# Patient Record
Sex: Male | Born: 1950 | Race: White | Hispanic: No | Smoking: Current every day smoker
Health system: Southern US, Community
[De-identification: ages and names within clinical notes are randomized; demographics above are authoritative.]

## PROBLEM LIST (undated history)

## (undated) DIAGNOSIS — M1990 Unspecified osteoarthritis, unspecified site: Secondary | ICD-10-CM

## (undated) DIAGNOSIS — E041 Nontoxic single thyroid nodule: Secondary | ICD-10-CM

## (undated) DIAGNOSIS — I2510 Atherosclerotic heart disease of native coronary artery without angina pectoris: Secondary | ICD-10-CM

## (undated) DIAGNOSIS — T7840XA Allergy, unspecified, initial encounter: Secondary | ICD-10-CM

## (undated) DIAGNOSIS — K219 Gastro-esophageal reflux disease without esophagitis: Secondary | ICD-10-CM

## (undated) DIAGNOSIS — E785 Hyperlipidemia, unspecified: Secondary | ICD-10-CM

## (undated) DIAGNOSIS — F329 Major depressive disorder, single episode, unspecified: Secondary | ICD-10-CM

## (undated) DIAGNOSIS — J302 Other seasonal allergic rhinitis: Secondary | ICD-10-CM

## (undated) DIAGNOSIS — R918 Other nonspecific abnormal finding of lung field: Secondary | ICD-10-CM

## (undated) DIAGNOSIS — N401 Enlarged prostate with lower urinary tract symptoms: Secondary | ICD-10-CM

## (undated) DIAGNOSIS — F418 Other specified anxiety disorders: Secondary | ICD-10-CM

## (undated) DIAGNOSIS — J449 Chronic obstructive pulmonary disease, unspecified: Secondary | ICD-10-CM

## (undated) DIAGNOSIS — R252 Cramp and spasm: Secondary | ICD-10-CM

## (undated) DIAGNOSIS — E538 Deficiency of other specified B group vitamins: Secondary | ICD-10-CM

## (undated) DIAGNOSIS — Z87442 Personal history of urinary calculi: Secondary | ICD-10-CM

## (undated) DIAGNOSIS — J189 Pneumonia, unspecified organism: Secondary | ICD-10-CM

## (undated) DIAGNOSIS — K8020 Calculus of gallbladder without cholecystitis without obstruction: Secondary | ICD-10-CM

## (undated) DIAGNOSIS — I219 Acute myocardial infarction, unspecified: Secondary | ICD-10-CM

## (undated) DIAGNOSIS — I639 Cerebral infarction, unspecified: Secondary | ICD-10-CM

## (undated) DIAGNOSIS — I499 Cardiac arrhythmia, unspecified: Secondary | ICD-10-CM

## (undated) DIAGNOSIS — E0590 Thyrotoxicosis, unspecified without thyrotoxic crisis or storm: Secondary | ICD-10-CM

## (undated) DIAGNOSIS — F32A Depression, unspecified: Secondary | ICD-10-CM

## (undated) DIAGNOSIS — N189 Chronic kidney disease, unspecified: Secondary | ICD-10-CM

## (undated) DIAGNOSIS — I700 Atherosclerosis of aorta: Secondary | ICD-10-CM

## (undated) DIAGNOSIS — F419 Anxiety disorder, unspecified: Secondary | ICD-10-CM

## (undated) DIAGNOSIS — R42 Dizziness and giddiness: Secondary | ICD-10-CM

## (undated) HISTORY — DX: Hyperlipidemia, unspecified: E78.5

## (undated) HISTORY — DX: Pneumonia, unspecified organism: J18.9

## (undated) HISTORY — DX: Gastro-esophageal reflux disease without esophagitis: K21.9

## (undated) HISTORY — DX: Other seasonal allergic rhinitis: J30.2

## (undated) HISTORY — DX: Calculus of gallbladder without cholecystitis without obstruction: K80.20

## (undated) HISTORY — PX: COLONOSCOPY: SHX174

## (undated) HISTORY — DX: Allergy, unspecified, initial encounter: T78.40XA

## (undated) HISTORY — DX: Chronic kidney disease, unspecified: N18.9

---
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
## 2014-01-09 DIAGNOSIS — I499 Cardiac arrhythmia, unspecified: Secondary | ICD-10-CM

## 2014-01-09 HISTORY — DX: Cardiac arrhythmia, unspecified: I49.9

## 2015-01-19 DIAGNOSIS — R238 Other skin changes: Secondary | ICD-10-CM | POA: Diagnosis not present

## 2015-01-19 DIAGNOSIS — I4510 Unspecified right bundle-branch block: Secondary | ICD-10-CM | POA: Diagnosis not present

## 2015-01-19 DIAGNOSIS — J449 Chronic obstructive pulmonary disease, unspecified: Secondary | ICD-10-CM | POA: Diagnosis not present

## 2015-01-19 DIAGNOSIS — R55 Syncope and collapse: Secondary | ICD-10-CM | POA: Diagnosis not present

## 2015-01-19 DIAGNOSIS — D72829 Elevated white blood cell count, unspecified: Secondary | ICD-10-CM | POA: Diagnosis not present

## 2015-01-19 DIAGNOSIS — R9431 Abnormal electrocardiogram [ECG] [EKG]: Secondary | ICD-10-CM | POA: Diagnosis not present

## 2015-01-19 DIAGNOSIS — R0600 Dyspnea, unspecified: Secondary | ICD-10-CM | POA: Diagnosis not present

## 2015-01-19 DIAGNOSIS — S40811A Abrasion of right upper arm, initial encounter: Secondary | ICD-10-CM | POA: Diagnosis not present

## 2015-01-19 DIAGNOSIS — I4891 Unspecified atrial fibrillation: Secondary | ICD-10-CM | POA: Diagnosis not present

## 2015-01-19 DIAGNOSIS — Z79899 Other long term (current) drug therapy: Secondary | ICD-10-CM | POA: Diagnosis not present

## 2015-01-19 DIAGNOSIS — N400 Enlarged prostate without lower urinary tract symptoms: Secondary | ICD-10-CM | POA: Diagnosis not present

## 2015-01-19 DIAGNOSIS — S40812A Abrasion of left upper arm, initial encounter: Secondary | ICD-10-CM | POA: Diagnosis not present

## 2015-01-19 DIAGNOSIS — E0580 Other thyrotoxicosis without thyrotoxic crisis or storm: Secondary | ICD-10-CM | POA: Diagnosis not present

## 2015-01-19 DIAGNOSIS — Z720 Tobacco use: Secondary | ICD-10-CM | POA: Diagnosis not present

## 2015-01-19 DIAGNOSIS — R069 Unspecified abnormalities of breathing: Secondary | ICD-10-CM | POA: Diagnosis not present

## 2015-01-19 DIAGNOSIS — Z801 Family history of malignant neoplasm of trachea, bronchus and lung: Secondary | ICD-10-CM | POA: Diagnosis not present

## 2015-01-19 DIAGNOSIS — I444 Left anterior fascicular block: Secondary | ICD-10-CM | POA: Diagnosis not present

## 2015-01-19 DIAGNOSIS — J9809 Other diseases of bronchus, not elsewhere classified: Secondary | ICD-10-CM | POA: Diagnosis not present

## 2015-01-19 DIAGNOSIS — E0590 Thyrotoxicosis, unspecified without thyrotoxic crisis or storm: Secondary | ICD-10-CM | POA: Diagnosis not present

## 2015-01-19 DIAGNOSIS — I083 Combined rheumatic disorders of mitral, aortic and tricuspid valves: Secondary | ICD-10-CM | POA: Diagnosis not present

## 2015-01-21 DIAGNOSIS — R931 Abnormal findings on diagnostic imaging of heart and coronary circulation: Secondary | ICD-10-CM | POA: Diagnosis not present

## 2015-01-21 DIAGNOSIS — I4891 Unspecified atrial fibrillation: Secondary | ICD-10-CM | POA: Diagnosis not present

## 2015-01-21 DIAGNOSIS — I059 Rheumatic mitral valve disease, unspecified: Secondary | ICD-10-CM | POA: Diagnosis not present

## 2015-01-21 DIAGNOSIS — F17200 Nicotine dependence, unspecified, uncomplicated: Secondary | ICD-10-CM | POA: Diagnosis not present

## 2015-01-22 DIAGNOSIS — E02 Subclinical iodine-deficiency hypothyroidism: Secondary | ICD-10-CM | POA: Diagnosis not present

## 2015-01-22 DIAGNOSIS — Z8679 Personal history of other diseases of the circulatory system: Secondary | ICD-10-CM | POA: Diagnosis not present

## 2015-01-22 DIAGNOSIS — R51 Headache: Secondary | ICD-10-CM | POA: Diagnosis not present

## 2015-01-22 DIAGNOSIS — E039 Hypothyroidism, unspecified: Secondary | ICD-10-CM | POA: Diagnosis not present

## 2015-01-22 DIAGNOSIS — R531 Weakness: Secondary | ICD-10-CM | POA: Diagnosis not present

## 2015-01-22 DIAGNOSIS — I4891 Unspecified atrial fibrillation: Secondary | ICD-10-CM | POA: Diagnosis not present

## 2015-01-22 DIAGNOSIS — R002 Palpitations: Secondary | ICD-10-CM | POA: Diagnosis not present

## 2015-01-22 DIAGNOSIS — F17210 Nicotine dependence, cigarettes, uncomplicated: Secondary | ICD-10-CM | POA: Diagnosis not present

## 2015-01-22 DIAGNOSIS — Z7982 Long term (current) use of aspirin: Secondary | ICD-10-CM | POA: Diagnosis not present

## 2015-01-22 DIAGNOSIS — R079 Chest pain, unspecified: Secondary | ICD-10-CM | POA: Diagnosis not present

## 2015-01-22 DIAGNOSIS — R42 Dizziness and giddiness: Secondary | ICD-10-CM | POA: Diagnosis not present

## 2015-01-22 DIAGNOSIS — N400 Enlarged prostate without lower urinary tract symptoms: Secondary | ICD-10-CM | POA: Diagnosis not present

## 2015-01-22 DIAGNOSIS — I480 Paroxysmal atrial fibrillation: Secondary | ICD-10-CM | POA: Diagnosis not present

## 2015-01-22 DIAGNOSIS — R55 Syncope and collapse: Secondary | ICD-10-CM | POA: Diagnosis not present

## 2015-01-22 DIAGNOSIS — Z79899 Other long term (current) drug therapy: Secondary | ICD-10-CM | POA: Diagnosis not present

## 2015-01-23 DIAGNOSIS — E039 Hypothyroidism, unspecified: Secondary | ICD-10-CM | POA: Diagnosis not present

## 2015-01-23 DIAGNOSIS — R42 Dizziness and giddiness: Secondary | ICD-10-CM | POA: Diagnosis not present

## 2015-01-23 DIAGNOSIS — R002 Palpitations: Secondary | ICD-10-CM | POA: Diagnosis not present

## 2015-01-23 DIAGNOSIS — G939 Disorder of brain, unspecified: Secondary | ICD-10-CM | POA: Diagnosis not present

## 2015-01-23 DIAGNOSIS — I4891 Unspecified atrial fibrillation: Secondary | ICD-10-CM | POA: Diagnosis not present

## 2015-01-26 DIAGNOSIS — F17200 Nicotine dependence, unspecified, uncomplicated: Secondary | ICD-10-CM | POA: Diagnosis not present

## 2015-01-26 DIAGNOSIS — F419 Anxiety disorder, unspecified: Secondary | ICD-10-CM | POA: Diagnosis not present

## 2015-01-26 DIAGNOSIS — R21 Rash and other nonspecific skin eruption: Secondary | ICD-10-CM | POA: Diagnosis not present

## 2015-01-26 DIAGNOSIS — E039 Hypothyroidism, unspecified: Secondary | ICD-10-CM | POA: Diagnosis not present

## 2015-01-26 DIAGNOSIS — L282 Other prurigo: Secondary | ICD-10-CM | POA: Diagnosis not present

## 2015-01-26 DIAGNOSIS — F322 Major depressive disorder, single episode, severe without psychotic features: Secondary | ICD-10-CM | POA: Diagnosis not present

## 2015-01-26 DIAGNOSIS — N400 Enlarged prostate without lower urinary tract symptoms: Secondary | ICD-10-CM | POA: Diagnosis not present

## 2015-01-26 DIAGNOSIS — I480 Paroxysmal atrial fibrillation: Secondary | ICD-10-CM | POA: Diagnosis not present

## 2015-02-05 DIAGNOSIS — R1110 Vomiting, unspecified: Secondary | ICD-10-CM | POA: Diagnosis not present

## 2015-02-11 DIAGNOSIS — I4891 Unspecified atrial fibrillation: Secondary | ICD-10-CM | POA: Diagnosis not present

## 2015-02-11 DIAGNOSIS — Z6828 Body mass index (BMI) 28.0-28.9, adult: Secondary | ICD-10-CM | POA: Diagnosis not present

## 2015-02-11 DIAGNOSIS — Z79899 Other long term (current) drug therapy: Secondary | ICD-10-CM | POA: Diagnosis not present

## 2015-02-28 DIAGNOSIS — L299 Pruritus, unspecified: Secondary | ICD-10-CM | POA: Diagnosis not present

## 2015-02-28 DIAGNOSIS — R21 Rash and other nonspecific skin eruption: Secondary | ICD-10-CM | POA: Diagnosis not present

## 2015-03-04 DIAGNOSIS — E0590 Thyrotoxicosis, unspecified without thyrotoxic crisis or storm: Secondary | ICD-10-CM | POA: Diagnosis not present

## 2015-03-04 DIAGNOSIS — E039 Hypothyroidism, unspecified: Secondary | ICD-10-CM | POA: Diagnosis not present

## 2015-03-17 DIAGNOSIS — R21 Rash and other nonspecific skin eruption: Secondary | ICD-10-CM | POA: Diagnosis not present

## 2015-03-17 DIAGNOSIS — M542 Cervicalgia: Secondary | ICD-10-CM | POA: Diagnosis not present

## 2015-03-17 DIAGNOSIS — R42 Dizziness and giddiness: Secondary | ICD-10-CM | POA: Diagnosis not present

## 2015-03-17 DIAGNOSIS — I959 Hypotension, unspecified: Secondary | ICD-10-CM | POA: Diagnosis not present

## 2015-03-18 DIAGNOSIS — R42 Dizziness and giddiness: Secondary | ICD-10-CM | POA: Diagnosis not present

## 2015-03-18 DIAGNOSIS — I4891 Unspecified atrial fibrillation: Secondary | ICD-10-CM | POA: Diagnosis not present

## 2015-03-18 DIAGNOSIS — I480 Paroxysmal atrial fibrillation: Secondary | ICD-10-CM | POA: Diagnosis not present

## 2015-03-18 DIAGNOSIS — Z6828 Body mass index (BMI) 28.0-28.9, adult: Secondary | ICD-10-CM | POA: Diagnosis not present

## 2015-03-29 DIAGNOSIS — M5412 Radiculopathy, cervical region: Secondary | ICD-10-CM | POA: Diagnosis not present

## 2015-03-29 DIAGNOSIS — R42 Dizziness and giddiness: Secondary | ICD-10-CM | POA: Diagnosis not present

## 2015-04-08 DIAGNOSIS — M542 Cervicalgia: Secondary | ICD-10-CM | POA: Diagnosis not present

## 2015-04-08 DIAGNOSIS — M5412 Radiculopathy, cervical region: Secondary | ICD-10-CM | POA: Diagnosis not present

## 2015-04-09 DIAGNOSIS — M25511 Pain in right shoulder: Secondary | ICD-10-CM | POA: Diagnosis not present

## 2015-04-12 DIAGNOSIS — R202 Paresthesia of skin: Secondary | ICD-10-CM | POA: Diagnosis not present

## 2015-05-04 DIAGNOSIS — Z87891 Personal history of nicotine dependence: Secondary | ICD-10-CM | POA: Diagnosis not present

## 2015-05-04 DIAGNOSIS — E0520 Thyrotoxicosis with toxic multinodular goiter without thyrotoxic crisis or storm: Secondary | ICD-10-CM | POA: Diagnosis not present

## 2015-05-04 DIAGNOSIS — R490 Dysphonia: Secondary | ICD-10-CM | POA: Diagnosis not present

## 2015-05-04 DIAGNOSIS — I4891 Unspecified atrial fibrillation: Secondary | ICD-10-CM | POA: Diagnosis not present

## 2015-05-04 DIAGNOSIS — E0590 Thyrotoxicosis, unspecified without thyrotoxic crisis or storm: Secondary | ICD-10-CM | POA: Diagnosis not present

## 2015-05-19 DIAGNOSIS — R490 Dysphonia: Secondary | ICD-10-CM | POA: Diagnosis not present

## 2015-05-19 DIAGNOSIS — B370 Candidal stomatitis: Secondary | ICD-10-CM | POA: Diagnosis not present

## 2015-05-19 DIAGNOSIS — J342 Deviated nasal septum: Secondary | ICD-10-CM | POA: Diagnosis not present

## 2015-05-27 DIAGNOSIS — M25511 Pain in right shoulder: Secondary | ICD-10-CM | POA: Diagnosis not present

## 2015-06-10 DIAGNOSIS — F17200 Nicotine dependence, unspecified, uncomplicated: Secondary | ICD-10-CM | POA: Diagnosis not present

## 2015-06-10 DIAGNOSIS — R490 Dysphonia: Secondary | ICD-10-CM | POA: Diagnosis not present

## 2015-06-10 DIAGNOSIS — B370 Candidal stomatitis: Secondary | ICD-10-CM | POA: Diagnosis not present

## 2015-06-29 DIAGNOSIS — Z0000 Encounter for general adult medical examination without abnormal findings: Secondary | ICD-10-CM | POA: Diagnosis not present

## 2015-06-29 DIAGNOSIS — R0789 Other chest pain: Secondary | ICD-10-CM | POA: Diagnosis not present

## 2015-06-29 DIAGNOSIS — N400 Enlarged prostate without lower urinary tract symptoms: Secondary | ICD-10-CM | POA: Diagnosis not present

## 2015-06-29 DIAGNOSIS — Z23 Encounter for immunization: Secondary | ICD-10-CM | POA: Diagnosis not present

## 2015-06-29 DIAGNOSIS — E039 Hypothyroidism, unspecified: Secondary | ICD-10-CM | POA: Diagnosis not present

## 2015-06-29 DIAGNOSIS — R079 Chest pain, unspecified: Secondary | ICD-10-CM | POA: Diagnosis not present

## 2015-06-29 DIAGNOSIS — E0590 Thyrotoxicosis, unspecified without thyrotoxic crisis or storm: Secondary | ICD-10-CM | POA: Diagnosis not present

## 2015-06-29 DIAGNOSIS — Z1159 Encounter for screening for other viral diseases: Secondary | ICD-10-CM | POA: Diagnosis not present

## 2015-07-27 DIAGNOSIS — I4891 Unspecified atrial fibrillation: Secondary | ICD-10-CM | POA: Diagnosis not present

## 2015-07-27 DIAGNOSIS — F329 Major depressive disorder, single episode, unspecified: Secondary | ICD-10-CM | POA: Diagnosis not present

## 2015-08-10 DIAGNOSIS — F17200 Nicotine dependence, unspecified, uncomplicated: Secondary | ICD-10-CM | POA: Diagnosis not present

## 2015-08-10 DIAGNOSIS — M25511 Pain in right shoulder: Secondary | ICD-10-CM | POA: Diagnosis not present

## 2015-08-10 DIAGNOSIS — G4700 Insomnia, unspecified: Secondary | ICD-10-CM | POA: Diagnosis not present

## 2015-08-25 DIAGNOSIS — F419 Anxiety disorder, unspecified: Secondary | ICD-10-CM | POA: Diagnosis not present

## 2015-08-25 DIAGNOSIS — I4891 Unspecified atrial fibrillation: Secondary | ICD-10-CM | POA: Diagnosis not present

## 2015-08-25 DIAGNOSIS — F329 Major depressive disorder, single episode, unspecified: Secondary | ICD-10-CM | POA: Diagnosis not present

## 2015-08-30 DIAGNOSIS — M25511 Pain in right shoulder: Secondary | ICD-10-CM | POA: Diagnosis not present

## 2015-09-30 DIAGNOSIS — F329 Major depressive disorder, single episode, unspecified: Secondary | ICD-10-CM | POA: Diagnosis not present

## 2015-09-30 DIAGNOSIS — I4891 Unspecified atrial fibrillation: Secondary | ICD-10-CM | POA: Diagnosis not present

## 2015-09-30 DIAGNOSIS — F419 Anxiety disorder, unspecified: Secondary | ICD-10-CM | POA: Diagnosis not present

## 2015-10-11 DIAGNOSIS — M25511 Pain in right shoulder: Secondary | ICD-10-CM | POA: Diagnosis not present

## 2015-10-12 DIAGNOSIS — F419 Anxiety disorder, unspecified: Secondary | ICD-10-CM | POA: Diagnosis not present

## 2015-10-12 DIAGNOSIS — I4891 Unspecified atrial fibrillation: Secondary | ICD-10-CM | POA: Diagnosis not present

## 2015-10-12 DIAGNOSIS — F329 Major depressive disorder, single episode, unspecified: Secondary | ICD-10-CM | POA: Diagnosis not present

## 2015-10-20 DIAGNOSIS — M25611 Stiffness of right shoulder, not elsewhere classified: Secondary | ICD-10-CM | POA: Diagnosis not present

## 2015-10-20 DIAGNOSIS — M25511 Pain in right shoulder: Secondary | ICD-10-CM | POA: Diagnosis not present

## 2015-10-26 DIAGNOSIS — M25511 Pain in right shoulder: Secondary | ICD-10-CM | POA: Diagnosis not present

## 2015-10-26 DIAGNOSIS — M25611 Stiffness of right shoulder, not elsewhere classified: Secondary | ICD-10-CM | POA: Diagnosis not present

## 2015-10-28 DIAGNOSIS — M25611 Stiffness of right shoulder, not elsewhere classified: Secondary | ICD-10-CM | POA: Diagnosis not present

## 2015-10-28 DIAGNOSIS — M25511 Pain in right shoulder: Secondary | ICD-10-CM | POA: Diagnosis not present

## 2015-11-01 DIAGNOSIS — L259 Unspecified contact dermatitis, unspecified cause: Secondary | ICD-10-CM | POA: Diagnosis not present

## 2015-11-01 DIAGNOSIS — M25511 Pain in right shoulder: Secondary | ICD-10-CM | POA: Diagnosis not present

## 2015-11-02 DIAGNOSIS — M25511 Pain in right shoulder: Secondary | ICD-10-CM | POA: Diagnosis not present

## 2015-11-02 DIAGNOSIS — M25611 Stiffness of right shoulder, not elsewhere classified: Secondary | ICD-10-CM | POA: Diagnosis not present

## 2015-11-10 DIAGNOSIS — M25511 Pain in right shoulder: Secondary | ICD-10-CM | POA: Diagnosis not present

## 2015-11-29 ENCOUNTER — Emergency Department (HOSPITAL_COMMUNITY)
Admission: EM | Admit: 2015-11-29 | Discharge: 2015-11-29 | Disposition: A | Discharge status: Home / Self Care | Payer: Medicare Other | Attending: Emergency Medicine | Admitting: Emergency Medicine

## 2015-11-29 ENCOUNTER — Emergency Department (HOSPITAL_COMMUNITY): Payer: Medicare Other

## 2015-11-29 ENCOUNTER — Encounter (HOSPITAL_COMMUNITY): Payer: Self-pay | Admitting: Emergency Medicine

## 2015-11-29 DIAGNOSIS — F17200 Nicotine dependence, unspecified, uncomplicated: Secondary | ICD-10-CM | POA: Diagnosis not present

## 2015-11-29 DIAGNOSIS — Z79899 Other long term (current) drug therapy: Secondary | ICD-10-CM | POA: Diagnosis not present

## 2015-11-29 DIAGNOSIS — R1032 Left lower quadrant pain: Secondary | ICD-10-CM | POA: Diagnosis not present

## 2015-11-29 DIAGNOSIS — N201 Calculus of ureter: Secondary | ICD-10-CM | POA: Insufficient documentation

## 2015-11-29 DIAGNOSIS — R110 Nausea: Secondary | ICD-10-CM | POA: Diagnosis not present

## 2015-11-29 DIAGNOSIS — N132 Hydronephrosis with renal and ureteral calculous obstruction: Secondary | ICD-10-CM | POA: Diagnosis not present

## 2015-11-29 DIAGNOSIS — R109 Unspecified abdominal pain: Secondary | ICD-10-CM

## 2015-11-29 DIAGNOSIS — K2970 Gastritis, unspecified, without bleeding: Secondary | ICD-10-CM | POA: Diagnosis not present

## 2015-11-29 LAB — URINALYSIS, ROUTINE W REFLEX MICROSCOPIC
BILIRUBIN URINE: NEGATIVE
Glucose, UA: NEGATIVE mg/dL
Ketones, ur: NEGATIVE mg/dL
Leukocytes, UA: NEGATIVE
Nitrite: NEGATIVE
Protein, ur: NEGATIVE mg/dL
SPECIFIC GRAVITY, URINE: 1.046 — AB (ref 1.005–1.030)
pH: 6 (ref 5.0–8.0)

## 2015-11-29 LAB — COMPREHENSIVE METABOLIC PANEL
ALBUMIN: 4.7 g/dL (ref 3.5–5.0)
ALK PHOS: 86 U/L (ref 38–126)
ALT: 26 U/L (ref 17–63)
AST: 32 U/L (ref 15–41)
Anion gap: 8 (ref 5–15)
BUN: 17 mg/dL (ref 6–20)
CALCIUM: 9.3 mg/dL (ref 8.9–10.3)
CO2: 28 mmol/L (ref 22–32)
CREATININE: 1.13 mg/dL (ref 0.61–1.24)
Chloride: 104 mmol/L (ref 101–111)
GFR calc Af Amer: >60 mL/min (ref >60)
GFR calc non Af Amer: >60 mL/min (ref >60)
GLUCOSE: 112 mg/dL — AB (ref 65–99)
Potassium: 3.6 mmol/L (ref 3.5–5.1)
SODIUM: 140 mmol/L (ref 135–145)
Total Bilirubin: 1.3 mg/dL — ABNORMAL HIGH (ref 0.3–1.2)
Total Protein: 7.3 g/dL (ref 6.5–8.1)

## 2015-11-29 LAB — CBC
HCT: 50.7 % (ref 39.0–52.0)
HEMOGLOBIN: 17.6 g/dL — AB (ref 13.0–17.0)
MCH: 29.3 pg (ref 26.0–34.0)
MCHC: 34.7 g/dL (ref 30.0–36.0)
MCV: 84.5 fL (ref 78.0–100.0)
PLATELETS: 145 10*3/uL — AB (ref 150–400)
RBC: 6 MIL/uL — ABNORMAL HIGH (ref 4.22–5.81)
RDW: 13.1 % (ref 11.5–15.5)
WBC: 10.8 10*3/uL — ABNORMAL HIGH (ref 4.0–10.5)

## 2015-11-29 LAB — URINE MICROSCOPIC-ADD ON
BACTERIA UA: NONE SEEN
SQUAMOUS EPITHELIAL / LPF: NONE SEEN

## 2015-11-29 LAB — GRAM STAIN

## 2015-11-29 LAB — LIPASE, BLOOD: Lipase: 47 U/L (ref 11–51)

## 2015-11-29 MED ORDER — ONDANSETRON HCL 4 MG/2ML IJ SOLN
4.0000 mg | Freq: Once | INTRAMUSCULAR | Status: AC
  Administered 2015-11-29: 4 mg via INTRAVENOUS
  Filled 2015-11-29: qty 2

## 2015-11-29 MED ORDER — IOPAMIDOL (ISOVUE-300) INJECTION 61%
INTRAVENOUS | Status: AC
  Filled 2015-11-29: qty 100

## 2015-11-29 MED ORDER — MORPHINE SULFATE (PF) 4 MG/ML IV SOLN
4.0000 mg | Freq: Once | INTRAVENOUS | Status: AC
  Administered 2015-11-29: 4 mg via INTRAVENOUS
  Filled 2015-11-29: qty 1

## 2015-11-29 MED ORDER — SODIUM CHLORIDE 0.9 % IV BOLUS (SEPSIS)
1000.0000 mL | Freq: Once | INTRAVENOUS | Status: AC
  Administered 2015-11-29: 1000 mL via INTRAVENOUS

## 2015-11-29 MED ORDER — IOPAMIDOL (ISOVUE-300) INJECTION 61%
100.0000 mL | Freq: Once | INTRAVENOUS | Status: AC | PRN
  Administered 2015-11-29: 100 mL via INTRAVENOUS

## 2015-11-29 MED ORDER — ONDANSETRON 4 MG PO TBDP: 4.0000 mg | ORAL_TABLET | Freq: Three times a day (TID) | ORAL | 0 refills | Status: AC | PRN

## 2015-11-29 NOTE — ED Notes (Signed)
Gave pt PO fluids. Pt to try to get urine sample after finishing drink

## 2015-11-29 NOTE — ED Notes (Signed)
Patient given urinal and will call out when sample ready.

## 2015-11-29 NOTE — Discharge Instructions (Signed)
Continue with your Naprosyn and Flomax regimen. You have been provided medicine for nausea. If you're unable to tolerate medicine or liquids by mouth even with the antinausea medicine please return to the emergency department.

## 2015-11-29 NOTE — ED Triage Notes (Signed)
Pt began to have LLQ pain this am accompanied by emesis this am. LBM last night. No diarrhea. Pt reports bending knees to chest decreases pain.

## 2015-11-29 NOTE — ED Notes (Signed)
Pt transported to CT ?

## 2015-11-29 NOTE — ED Notes (Signed)
Toileting offered, pt states he is unable to go at this time. Just started NS bolus.

## 2015-11-29 NOTE — ED Notes (Signed)
Bed: [id number] Expected date:  Expected time:  Means of arrival:  Comments: EMS- 65yo M, abdominal pain/emesis

## 2015-11-29 NOTE — ED Provider Notes (Signed)
[facility] DEPT Provider Note   CSN: [id number] Arrival date & time: 11/29/15  [id number]     History   Chief Complaint Chief Complaint  Patient presents with  . Abdominal Pain  . Emesis    HPI David Hendricks is a 65 y.o. male.  The history is provided by the patient.  Abdominal Pain   This is a new problem. The current episode started 3 to 5 hours ago. The problem occurs constantly. The problem has not changed since onset.The pain is located in the LLQ. The pain is moderate. Associated symptoms include nausea, vomiting and constipation (new). Pertinent negatives include fever and diarrhea. The symptoms are aggravated by certain positions. Nothing relieves the symptoms.  Emesis   Associated symptoms include abdominal pain. Pertinent negatives include no diarrhea and no fever.   Last colonoscopy last year: "Clean."  History reviewed. No pertinent past medical history.  There are no active problems to display for this patient.   History reviewed. No pertinent surgical history.     Home Medications    Prior to Admission medications   Medication Sig Start Date End Date Taking? Authorizing Provider  naproxen sodium (ANAPROX) 220 MG tablet Take 440 mg by mouth 2 (two) times daily with a meal.   Yes Historical Provider, MD  tamsulosin (FLOMAX) 0.4 MG CAPS capsule Take 0.4 mg by mouth daily. 08/22/15  Yes Historical Provider, MD  ondansetron (ZOFRAN ODT) 4 MG disintegrating tablet Take 1 tablet (4 mg total) by mouth every 8 (eight) hours as needed for nausea or vomiting. 11/29/15 12/02/15  Fatima Blank, MD    Family History History reviewed. No pertinent family history.  Social History Social History  Substance Use Topics  . Smoking status: Current Every Day Smoker  . Smokeless tobacco: Not on file  . Alcohol use No     Allergies   Patient has no known allergies.   Review of Systems Review of Systems  Constitutional: Negative for fever.  Gastrointestinal:  Positive for abdominal pain, constipation (new), nausea and vomiting. Negative for diarrhea.  Ten systems are reviewed and are negative for acute change except as noted in the HPI    Physical Exam Updated Vital Signs BP 157/83 (BP Location: Left Arm)   Pulse 72   Temp 97.5 F (36.4 C) (Oral)   Resp 22   SpO2 93%   Physical Exam  Constitutional: He is oriented to person, place, and time. He appears well-developed and well-nourished. No distress.  HENT:  Head: Normocephalic and atraumatic.  Nose: Nose normal.  Eyes: Conjunctivae and EOM are normal. Pupils are equal, round, and reactive to light. Right eye exhibits no discharge. Left eye exhibits no discharge. No scleral icterus.  Neck: Normal range of motion. Neck supple.  Cardiovascular: Normal rate and regular rhythm.  Exam reveals no gallop and no friction rub.   No murmur heard. Pulmonary/Chest: Effort normal and breath sounds normal. No stridor. No respiratory distress. He has no rales.  Abdominal: Soft. He exhibits no distension. There is tenderness in the left lower quadrant. There is no rigidity, no rebound, no guarding and no CVA tenderness (left).  Musculoskeletal: He exhibits no edema or tenderness.  Neurological: He is alert and oriented to person, place, and time.  Skin: Skin is warm and dry. No rash noted. He is not diaphoretic. No erythema.  Psychiatric: He has a normal mood and affect.  Vitals reviewed.    ED Treatments / Results  Labs (all labs ordered are listed, but  only abnormal results are displayed) Labs Reviewed  COMPREHENSIVE METABOLIC PANEL - Abnormal; Notable for the following:       Result Value   Glucose, Bld 112 (*)    Total Bilirubin 1.3 (*)    All other components within normal limits  CBC - Abnormal; Notable for the following:    WBC 10.8 (*)    RBC 6.00 (*)    Hemoglobin 17.6 (*)    Platelets 145 (*)    All other components within normal limits  URINALYSIS, ROUTINE W REFLEX MICROSCOPIC  (NOT AT [facility]) - Abnormal; Notable for the following:    Specific Gravity, Urine 1.046 (*)    Hgb urine dipstick LARGE (*)    All other components within normal limits  GRAM STAIN  LIPASE, BLOOD  URINE MICROSCOPIC-ADD ON    EKG  EKG Interpretation None       Radiology Ct Abdomen Pelvis W Contrast  Result Date: 11/29/2015 CLINICAL DATA:  Left flank pain starting last night, history of stones EXAM: CT ABDOMEN AND PELVIS WITH CONTRAST TECHNIQUE: Multidetector CT imaging of the abdomen and pelvis was performed using the standard protocol following bolus administration of intravenous contrast. CONTRAST:  150mL ISOVUE-300 IOPAMIDOL (ISOVUE-300) INJECTION 61% COMPARISON:  None. FINDINGS: Lower chest: The lung bases are unremarkable. Hepatobiliary: No calcified gallstones are noted within gallbladder. No focal hepatic mass. Pancreas: Enhanced pancreas is unremarkable. Spleen: Enhanced spleen is unremarkable. Adrenals/Urinary Tract: No adrenal gland mass. There is mild left hydronephrosis and left hydroureter. There is mild left perinephric stranding. Small left perinephric fluid. Axial image 26 there is nonobstructive 5.8 mm calcified calculus in upper pole of the left kidney. Axial image 80 there is 4 mm calcified calculus in left UVJ/ urinary bladder wall. No right ureteral calculi are noted. Delayed renal images shows bilateral renal and ureteral excretion. Mild delayed excretion of the left kidney probable mild obstructive uropathy. Stomach/Bowel: No small bowel obstruction. No thickened or dilated small bowel loops. No pericecal inflammation. Normal appendix is noted in axial image 59. Terminal ileum is unremarkable. No distal colonic obstruction. Vascular/Lymphatic: Atherosclerotic calcifications are noted abdominal aorta and iliac arteries. No aortic aneurysm. Reproductive: Small calcification is noted in left lobe of prostate gland measures about 5.5 mm. No calcified calculi are noted within  urinary bladder. Other: There is no ascites or free abdominal air.  No adenopathy. Musculoskeletal: No destructive bony lesions are noted. Sagittal images of the spine shows disc space flattening with vacuum disc phenomenon and mild posterior disc bulge at L5-S1 level. IMPRESSION: 1. There is mild left hydronephrosis and left hydroureter. Small left perinephric fluid. Mild left perinephric stranding. Left nonobstructive nephrolithiasis. Mild delayed excretion of the left kidney probable mild obstructive uropathy. 2. Axial image 80 there is 4 mm partially obstructive calculus in left UVJ/urinary bladder wall. 3. Normal appendix.  No pericecal inflammation. 4. No small bowel obstruction. 5. Degenerative changes lumbar spine at L5-S1 level. Electronically Signed   By: Lahoma Crocker M.D.   On: 11/29/2015 11:48    Procedures Procedures (including critical care time)  Medications Ordered in ED Medications  sodium chloride 0.9 % bolus 1,000 mL (0 mLs Intravenous Stopped 11/29/15 1125)  morphine 4 MG/ML injection 4 mg (4 mg Intravenous Given 11/29/15 1033)  ondansetron (ZOFRAN) injection 4 mg (4 mg Intravenous Given 11/29/15 1033)  iopamidol (ISOVUE-300) 61 % injection 100 mL (100 mLs Intravenous Contrast Given 11/29/15 1125)     Initial Impression / Assessment and Plan / ED Course  I  have reviewed the triage vital signs and the nursing notes.  Pertinent labs & imaging results that were available during my care of the patient were reviewed by me and considered in my medical decision making (see chart for details).  Clinical Course     Workup revealed left ureteral stone with mild hydro-uronephrosis with some apparent nephrotic stranding. UA with No evidence of infection. Pain has been controlled in the ED. Patient able to tolerate by mouth. Stable for discharge with strict return precautions. Provided with contact information for urology. Patient already has Flomax at home. We'll provide her with  anti-emetic.  Final Clinical Impressions(s) / ED Diagnoses   Final diagnoses:  Left ureteral stone  Left flank pain   Disposition: Discharge  Condition: Good  I have discussed the results, Dx and Tx plan with the patient who expressed understanding and agree(s) with the plan. Discharge instructions discussed at great length. The patient was given strict return precautions who verbalized understanding of the instructions. No further questions at time of discharge.    New Prescriptions   ONDANSETRON (ZOFRAN ODT) 4 MG DISINTEGRATING TABLET    Take 1 tablet (4 mg total) by mouth every 8 (eight) hours as needed for nausea or vomiting.    Follow Up: Nickie Retort, MD [place] [id number] [phone]  Schedule an appointment as soon as possible for a visit  in 3-5 days, If symptoms do not improve or  worsen      Fatima Blank, MD 11/29/15 1433

## 2015-11-29 NOTE — ED Notes (Signed)
Pt attempted to urinate with no success.

## 2015-12-02 DIAGNOSIS — R198 Other specified symptoms and signs involving the digestive system and abdomen: Secondary | ICD-10-CM | POA: Diagnosis not present

## 2015-12-02 DIAGNOSIS — K5900 Constipation, unspecified: Secondary | ICD-10-CM | POA: Diagnosis not present

## 2015-12-03 ENCOUNTER — Emergency Department (HOSPITAL_COMMUNITY)
Admission: EM | Admit: 2015-12-03 | Discharge: 2015-12-03 | Disposition: A | Discharge status: Home / Self Care | Payer: Medicare Other | Attending: Emergency Medicine | Admitting: Emergency Medicine

## 2015-12-03 ENCOUNTER — Encounter (HOSPITAL_COMMUNITY): Payer: Self-pay | Admitting: Emergency Medicine

## 2015-12-03 DIAGNOSIS — R109 Unspecified abdominal pain: Secondary | ICD-10-CM | POA: Insufficient documentation

## 2015-12-03 DIAGNOSIS — F17200 Nicotine dependence, unspecified, uncomplicated: Secondary | ICD-10-CM | POA: Insufficient documentation

## 2015-12-03 DIAGNOSIS — R1032 Left lower quadrant pain: Secondary | ICD-10-CM | POA: Diagnosis not present

## 2015-12-03 DIAGNOSIS — Z5321 Procedure and treatment not carried out due to patient leaving prior to being seen by health care provider: Secondary | ICD-10-CM | POA: Insufficient documentation

## 2015-12-03 LAB — COMPREHENSIVE METABOLIC PANEL
ALBUMIN: 4.2 g/dL (ref 3.5–5.0)
ALT: 23 U/L (ref 17–63)
AST: 21 U/L (ref 15–41)
Alkaline Phosphatase: 76 U/L (ref 38–126)
Anion gap: 8 (ref 5–15)
BUN: 13 mg/dL (ref 6–20)
CHLORIDE: 101 mmol/L (ref 101–111)
CO2: 29 mmol/L (ref 22–32)
Calcium: 9.1 mg/dL (ref 8.9–10.3)
Creatinine, Ser: 1.19 mg/dL (ref 0.61–1.24)
GFR calc Af Amer: >60 mL/min (ref >60)
GFR calc non Af Amer: >60 mL/min (ref >60)
Glucose, Bld: 99 mg/dL (ref 65–99)
POTASSIUM: 3.7 mmol/L (ref 3.5–5.1)
SODIUM: 138 mmol/L (ref 135–145)
Total Bilirubin: 1.3 mg/dL — ABNORMAL HIGH (ref 0.3–1.2)
Total Protein: 7.1 g/dL (ref 6.5–8.1)

## 2015-12-03 LAB — CBC
HEMATOCRIT: 49.2 % (ref 39.0–52.0)
Hemoglobin: 17.2 g/dL — ABNORMAL HIGH (ref 13.0–17.0)
MCH: 29.7 pg (ref 26.0–34.0)
MCHC: 35 g/dL (ref 30.0–36.0)
MCV: 84.8 fL (ref 78.0–100.0)
Platelets: 132 10*3/uL — ABNORMAL LOW (ref 150–400)
RBC: 5.8 MIL/uL (ref 4.22–5.81)
RDW: 13.2 % (ref 11.5–15.5)
WBC: 13 10*3/uL — AB (ref 4.0–10.5)

## 2015-12-03 LAB — LIPASE, BLOOD: LIPASE: 36 U/L (ref 11–51)

## 2015-12-03 NOTE — ED Notes (Signed)
Pt LWBS. Registration visualized pt leaving.

## 2015-12-03 NOTE — ED Triage Notes (Signed)
With triage pt continues to reports constipation since Sunday.

## 2015-12-03 NOTE — ED Triage Notes (Signed)
Per EMS pt recent diagnoses of left kidney stone; continued pain and nausea pain since. Unrelieved by aleve.

## 2016-01-17 ENCOUNTER — Ambulatory Visit (HOSPITAL_COMMUNITY)
Admission: EM | Admit: 2016-01-17 | Discharge: 2016-01-17 | Disposition: A | Discharge status: Home / Self Care | Payer: Medicare Other | Attending: Emergency Medicine | Admitting: Emergency Medicine

## 2016-01-17 ENCOUNTER — Encounter (HOSPITAL_COMMUNITY): Payer: Self-pay | Admitting: *Deleted

## 2016-01-17 DIAGNOSIS — M25511 Pain in right shoulder: Secondary | ICD-10-CM

## 2016-01-17 DIAGNOSIS — L509 Urticaria, unspecified: Secondary | ICD-10-CM | POA: Diagnosis not present

## 2016-01-17 MED ORDER — TRIAMCINOLONE ACETONIDE 40 MG/ML IJ SUSP
INTRAMUSCULAR | Status: AC
  Filled 2016-01-17: qty 1

## 2016-01-17 MED ORDER — TRAMADOL HCL 50 MG PO TABS: 50.0000 mg | ORAL_TABLET | Freq: Four times a day (QID) | ORAL | 0 refills | Status: DC | PRN

## 2016-01-17 MED ORDER — LIDOCAINE HCL 2 % IJ SOLN
INTRAMUSCULAR | Status: AC
  Filled 2016-01-17: qty 20

## 2016-01-17 MED ORDER — PREDNISONE 10 MG PO TABS: ORAL_TABLET | ORAL | 0 refills | Status: DC

## 2016-01-17 NOTE — ED Triage Notes (Signed)
Patient states he also has a rash on his back, reports started about 5 days ago.

## 2016-01-17 NOTE — ED Triage Notes (Signed)
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 States that he is new to [facility] and has not established a PCP yet. Has been taking tramadol for pain. Patient would like shoulder looked at and something for pain.

## 2016-01-17 NOTE — ED Provider Notes (Signed)
[facility]    CSN: [id number] Arrival date & time: 01/17/16  1442     History   Chief Complaint Chief Complaint  Patient presents with  . Shoulder Pain  . Rash    HPI David Hendricks is a 66 y.o. male.   HPI  He is a 66 year old man here for evaluation of right shoulder pain and a rash. He has had chronic right posterior shoulder pain from a work-related injury any years ago. He states he typically gets an injection every 3 months. His last injection was just over 3 months ago. He also has a prescription for tramadol that he uses as needed for pain. He took the last dose today. He is new to the [place] area and has not yet found a doctor. He has request in with the practice and should be hearing back next week.  About 5 days ago, he developed an itchy rash on the top of his back. He denies any new soaps or products. He has not tried anything.  History reviewed. No pertinent past medical history.  There are no active problems to display for this patient.   History reviewed. No pertinent surgical history.     Home Medications    Prior to Admission medications   Medication Sig Start Date End Date Taking? Authorizing Provider  predniSONE (DELTASONE) 10 MG tablet Take 6 tablets on day 1, 5 on day 2, 4 on day 3, 3 on day 4, 2 on day 5, 1 on day 6. 01/17/16   Melony Overly, MD  traMADol (ULTRAM) 50 MG tablet Take 1 tablet (50 mg total) by mouth every 6 (six) hours as needed for moderate pain. 01/17/16   Melony Overly, MD    Family History History reviewed. No pertinent family history.  Social History Social History  Substance Use Topics  . Smoking status: Current Every Day Smoker  . Smokeless tobacco: Never Used  . Alcohol use No     Allergies   Patient has no known allergies.   Review of Systems Review of Systems As in history of present illness  Physical Exam Triage Vital Signs ED Triage Vitals [01/17/16 1556]  Enc Vitals Group     BP 125/69     Pulse  Rate 81     Resp      Temp 98 F (36.7 C)     Temp Source Oral     SpO2 97 %     Weight      Height      Head Circumference      Peak Flow      Pain Score 4     Pain Loc      Pain Edu?      Excl. in [place]?    No data found.   Updated Vital Signs BP 125/69 (BP Location: Right Arm)   Pulse 81   Temp 98 F (36.7 C) (Oral)   SpO2 97%   Visual Acuity Right Eye Distance:   Left Eye Distance:   Bilateral Distance:    Right Eye Near:   Left Eye Near:    Bilateral Near:     Physical Exam  Constitutional: He is oriented to person, place, and time. He appears well-developed and well-nourished. No distress.  Cardiovascular: Normal rate.   Pulmonary/Chest: Effort normal.  Musculoskeletal:  Right shoulder: No erythema or edema. No obvious deformity. Full active range of motion without pain. He has a trigger point in the infraspinatus area of  the shoulder.  Neurological: He is alert and oriented to person, place, and time.  Skin: Rash (urticarial rash on upper back.) noted.     UC Treatments / Results  Labs (all labs ordered are listed, but only abnormal results are displayed) Labs Reviewed - No data to display  EKG  EKG Interpretation None       Radiology No results found.  Procedures Injection tendon or ligament Date/Time: 01/17/2016 5:34 PM Performed by: Melony Overly Authorized by: Melony Overly  Consent: Verbal consent obtained. Consent given by: patient Comments: Skin was cleaned with alcohol. Trigger point identified by palpation. 1 mL of 2% lidocaine with 40 mg Kenalog was injected into the trigger point in a wheel-like fashion. Patient tolerated procedure well with no immediate complication.    (including critical care time)  Medications Ordered in UC Medications - No data to display   Initial Impression / Assessment and Plan / UC Course  I have reviewed the triage vital signs and the nursing notes.  Pertinent labs & imaging results that were  available during my care of the patient were reviewed by me and considered in my medical decision making (see chart for details).  Clinical Course     Trigger point injection done. Provided 30 tablets of tramadol to use as needed. Prednisone taper for urticaria. Recommended Benadryl as needed for itching.  Final Clinical Impressions(s) / UC Diagnoses   Final diagnoses:  Trigger point of right shoulder region  Urticaria    New Prescriptions New Prescriptions   PREDNISONE (DELTASONE) 10 MG TABLET    Take 6 tablets on day 1, 5 on day 2, 4 on day 3, 3 on day 4, 2 on day 5, 1 on day 6.     Melony Overly, MD 01/17/16 [phone]

## 2016-02-10 ENCOUNTER — Telehealth: Payer: Self-pay

## 2016-02-10 NOTE — Telephone Encounter (Signed)
[facility] [place], NO PHONE

## 2016-02-11 ENCOUNTER — Ambulatory Visit (INDEPENDENT_AMBULATORY_CARE_PROVIDER_SITE_OTHER): Payer: Medicare Other | Admitting: Internal Medicine

## 2016-02-11 VITALS — BP 145/68 | HR 99 | Temp 98.0°F | Ht 73.0 in | Wt 206.7 lb

## 2016-02-11 DIAGNOSIS — M7551 Bursitis of right shoulder: Secondary | ICD-10-CM | POA: Insufficient documentation

## 2016-02-11 DIAGNOSIS — F17210 Nicotine dependence, cigarettes, uncomplicated: Secondary | ICD-10-CM

## 2016-02-11 DIAGNOSIS — N401 Enlarged prostate with lower urinary tract symptoms: Secondary | ICD-10-CM | POA: Insufficient documentation

## 2016-02-11 DIAGNOSIS — Z809 Family history of malignant neoplasm, unspecified: Secondary | ICD-10-CM | POA: Diagnosis not present

## 2016-02-11 DIAGNOSIS — M25511 Pain in right shoulder: Secondary | ICD-10-CM

## 2016-02-11 DIAGNOSIS — G8921 Chronic pain due to trauma: Secondary | ICD-10-CM | POA: Diagnosis not present

## 2016-02-11 DIAGNOSIS — R3912 Poor urinary stream: Secondary | ICD-10-CM | POA: Diagnosis not present

## 2016-02-11 DIAGNOSIS — R350 Frequency of micturition: Secondary | ICD-10-CM | POA: Diagnosis not present

## 2016-02-11 DIAGNOSIS — G8929 Other chronic pain: Secondary | ICD-10-CM

## 2016-02-11 MED ORDER — TAMSULOSIN HCL 0.4 MG PO CAPS: 0.8000 mg | ORAL_CAPSULE | Freq: Every day | ORAL | 3 refills | Status: DC

## 2016-02-11 NOTE — Assessment & Plan Note (Addendum)
He describes urinary frequency which started one year ago. He has a slow urinary stream but denies dysuria, hesitancy, or foul smell. His prior PCP prescribed tamsulosin 0.4 mg which he has been taking every day. He is not sure about the workup that his prior PCP performed to evaluate this.   Description of slow stream may be consistent with BPH or prostatitis. We have requested records from his PCP to see what workup has been completed already. May need to discuss the option to test PSA prostate exam in the future.  -urinalysis today  -follow up records from PCP  - increased to tamsulosin 0.8 mg daily   Addendum: Urinalysis with microscopy was reassuring that he does not have hematuria or signs of infection.

## 2016-02-11 NOTE — Patient Instructions (Signed)
It was a pleasure to meet you today David Hendricks,   For your difficulty with urinating, start taking flomax 0.8 mg (2 tablets) daily  Please schedule a follow up appointment to be seen in 2 months

## 2016-02-11 NOTE — Progress Notes (Addendum)
CC: urinary frequency   HPI: [name] is a 66 y.o. with past medical history as outlined below who presents to clinic to establish care and for follow up of urinary frequency.   He describes urinary frequency which started one year ago. He has a slow urinary stream but denies dysuria, hesitancy, or foul smell. His prior PCP prescribed tamsulosin 0.4 mg which he has been taking every day. He is not sure about the workup that his prior PCP performed to evaluate this.   Also describes risht shoulder pain which comes and goes throughout the day. The shoulder pain is chronic after a traumatic injury that he sustained at work. It is relieved by tramadol and aleve and he has been getting steroid injections every 3 months. Last cortisone injection 1/8. He has worked with physical therapy in the past and would like to do so again.   Please see problem list for status of the pt's chronic medical problems.  Past Medical History:  Diagnosis Date  . Seasonal allergies    History reviewed. No pertinent surgical history.    Family History  Problem Relation Age of Onset  . Cancer Father    Social History   Social History  . Marital status: Divorced    Spouse name: N/A  . Number of children: N/A  . Years of education: N/A   Social History Main Topics  . Smoking status: Current Every Day Smoker    Packs/day: 2.00    Years: 45.00    Types: Cigarettes    Start date: 01/09/1961  . Smokeless tobacco: Never Used     Comment: thinking about after settling in   . Alcohol use No  . Drug use: Unknown  . Sexual activity: Not Asked   Other Topics Concern  . None   Social History Narrative  . None   Review of Systems:  Please see each problem below for a pertinent review of systems.  Physical Exam:  Vitals:   02/11/16 0925  BP: (!) 145/68  Pulse: 99  Temp: 98 F (36.7 C)  TempSrc: Oral  SpO2: 97%  Weight: 206 lb 11.2 oz (93.8 kg)  Height: 6\' 1"  (1.854 m)   Physical Exam    Constitutional: He appears well-developed and well-nourished. No distress.  HENT:  Head: Normocephalic and atraumatic.  Eyes: Conjunctivae are normal. No scleral icterus.  Cardiovascular: Normal rate and regular rhythm.   No murmur heard. Pulmonary/Chest: Effort normal and breath sounds normal. No respiratory distress. He has no wheezes. He has no rales.  Abdominal: Soft. Bowel sounds are normal. He exhibits no distension. There is no tenderness.  Musculoskeletal:  Point tenderness over posterior lateral scapula border of right shoulder.  Shoulder ROM and strength intact and equal bilateral.   Neurological: He is alert.  Skin: Skin is warm and dry. He is not diaphoretic.  Psychiatric: He has a normal mood and affect. His behavior is normal.    Assessment & Plan:   See Encounters Tab for problem based charting.  Urinary frequency  Description of slow stream may be consistent with BPH or prostatitis. We have requested records from his PCP to see what workup has been completed already. May need to discuss the option to test PSA prostate exam in the future.  -urinalysis w reflex microscopy today - Negative for signs of infection, glucose, or hematuria  -follow up records from PCP  - increased to tamsulosin 0.8 mg daily   Right shoulder pain  May be related  to rotator cuff injury. Can consider shoulder xray in the future if he feels the response to conservative management is not longer working and to avoid long term steroid injections.  - Referral to physical therpay  -continue conservative management with tramadol and alleve  Patient discussed with Dr. Angelia Mould

## 2016-02-11 NOTE — Assessment & Plan Note (Addendum)
right shoulder pain which comes and goes throughout the day. The shoulder pain is chronic after a traumatic injury that he sustained at work. It is relieved by tramadol and aleve and he has been getting steroid injections every 3 months. Last cortisone injection 1/8. He has worked with physical therapy in the past and would like to do so again.   May be related to rotator cuff injury. Can consider shoulder xray in the future if he feels the response to conservative management is not longer working and to avoid long term steroid injections.   - Referral to physical therpay  -continue conservative management with tramadol and alleve

## 2016-02-12 LAB — URINALYSIS, COMPLETE
Bilirubin, UA: NEGATIVE
Glucose, UA: NEGATIVE
Ketones, UA: NEGATIVE
Leukocytes, UA: NEGATIVE
Nitrite, UA: NEGATIVE
PH UA: 5.5 (ref 5.0–7.5)
Protein, UA: NEGATIVE
RBC, UA: NEGATIVE
Specific Gravity, UA: 1.018 (ref 1.005–1.030)
Urobilinogen, Ur: 0.2 mg/dL (ref 0.2–1.0)

## 2016-02-12 LAB — MICROSCOPIC EXAMINATION
BACTERIA UA: NONE SEEN
CASTS: NONE SEEN /LPF

## 2016-02-15 NOTE — Progress Notes (Signed)
Internal Medicine Clinic Attending  Case discussed with Dr. Blum at the time of the visit.  We reviewed the resident's history and exam and pertinent patient test results.  I agree with the assessment, diagnosis, and plan of care documented in the resident's note. 

## 2016-03-06 NOTE — Addendum Note (Signed)
Addended by: Meryl Dare on: 03/06/2016 02:00 PM   Modules accepted: Orders, Level of Service

## 2016-03-13 ENCOUNTER — Telehealth: Payer: Self-pay

## 2016-03-13 NOTE — Telephone Encounter (Signed)
Requesting Tramadol to be filled. Please call pt back.  

## 2016-03-14 ENCOUNTER — Telehealth: Payer: Self-pay | Admitting: Physical Therapy

## 2016-03-14 NOTE — Telephone Encounter (Signed)
2/27, 3/1 & 03/14/16 unable to leave message on phone

## 2016-03-15 ENCOUNTER — Emergency Department (HOSPITAL_COMMUNITY)
Admission: EM | Admit: 2016-03-15 | Discharge: 2016-03-16 | Disposition: A | Discharge status: Home / Self Care | Payer: Medicare Other | Attending: Emergency Medicine | Admitting: Emergency Medicine

## 2016-03-15 ENCOUNTER — Encounter (HOSPITAL_COMMUNITY): Payer: Self-pay

## 2016-03-15 ENCOUNTER — Emergency Department (HOSPITAL_COMMUNITY): Payer: Medicare Other

## 2016-03-15 DIAGNOSIS — R072 Precordial pain: Secondary | ICD-10-CM | POA: Insufficient documentation

## 2016-03-15 DIAGNOSIS — Z5320 Procedure and treatment not carried out because of patient's decision for unspecified reasons: Secondary | ICD-10-CM

## 2016-03-15 DIAGNOSIS — R55 Syncope and collapse: Secondary | ICD-10-CM | POA: Diagnosis not present

## 2016-03-15 DIAGNOSIS — Z5329 Procedure and treatment not carried out because of patient's decision for other reasons: Secondary | ICD-10-CM

## 2016-03-15 DIAGNOSIS — Z79899 Other long term (current) drug therapy: Secondary | ICD-10-CM | POA: Insufficient documentation

## 2016-03-15 DIAGNOSIS — R0789 Other chest pain: Secondary | ICD-10-CM | POA: Diagnosis present

## 2016-03-15 DIAGNOSIS — F17210 Nicotine dependence, cigarettes, uncomplicated: Secondary | ICD-10-CM | POA: Diagnosis not present

## 2016-03-15 DIAGNOSIS — R404 Transient alteration of awareness: Secondary | ICD-10-CM | POA: Diagnosis not present

## 2016-03-15 LAB — BASIC METABOLIC PANEL
ANION GAP: 7 (ref 5–15)
BUN: 13 mg/dL (ref 6–20)
CHLORIDE: 104 mmol/L (ref 101–111)
CO2: 29 mmol/L (ref 22–32)
Calcium: 8.9 mg/dL (ref 8.9–10.3)
Creatinine, Ser: 1.05 mg/dL (ref 0.61–1.24)
Glucose, Bld: 114 mg/dL — ABNORMAL HIGH (ref 65–99)
Potassium: 4 mmol/L (ref 3.5–5.1)
SODIUM: 140 mmol/L (ref 135–145)

## 2016-03-15 LAB — I-STAT TROPONIN, ED: Troponin i, poc: 0 ng/mL (ref 0.00–0.08)

## 2016-03-15 LAB — CBC
HEMATOCRIT: 49.4 % (ref 39.0–52.0)
HEMOGLOBIN: 17 g/dL (ref 13.0–17.0)
MCH: 29 pg (ref 26.0–34.0)
MCHC: 34.4 g/dL (ref 30.0–36.0)
MCV: 84.3 fL (ref 78.0–100.0)
Platelets: 145 10*3/uL — ABNORMAL LOW (ref 150–400)
RBC: 5.86 MIL/uL — AB (ref 4.22–5.81)
RDW: 13.4 % (ref 11.5–15.5)
WBC: 14.1 10*3/uL — AB (ref 4.0–10.5)

## 2016-03-15 LAB — ETHANOL

## 2016-03-15 LAB — HEPATIC FUNCTION PANEL
ALBUMIN: 3.6 g/dL (ref 3.5–5.0)
ALT: 13 U/L — ABNORMAL LOW (ref 17–63)
AST: 16 U/L (ref 15–41)
Alkaline Phosphatase: 83 U/L (ref 38–126)
BILIRUBIN DIRECT: 0.2 mg/dL (ref 0.1–0.5)
Indirect Bilirubin: 0.6 mg/dL (ref 0.3–0.9)
TOTAL PROTEIN: 6.5 g/dL (ref 6.5–8.1)
Total Bilirubin: 0.8 mg/dL (ref 0.3–1.2)

## 2016-03-15 LAB — LIPASE, BLOOD: Lipase: 44 U/L (ref 11–51)

## 2016-03-15 NOTE — ED Provider Notes (Signed)
[facility] DEPT Provider Note   CSN: [id number] Arrival date & time: 03/15/16  2124     History   Chief Complaint Chief Complaint  Patient presents with  . Loss of Consciousness  . Chest Pain    HPI David Hendricks is a 66 y.o. male.  HPI Patient states that he had some chest tightness this evening. He reports he was feeling somewhat short of breath. He states that he was at home and had been sitting on his porch when the symptoms occurred. He needed to get something out of his vehicle and he walks through his yard to go to his car. He reports that the car was locked and he couldn't get in so he was heading back to the house and that has the last thing he remembers aside from that he had some chest discomfort. He reports he woke up with his neighbor standing over him and EMS. He reports that he still has some chest tightness. Past Medical History:  Diagnosis Date  . Seasonal allergies     Patient Active Problem List   Diagnosis Date Noted  . Urinary frequency 02/11/2016  . Right shoulder pain 02/11/2016    History reviewed. No pertinent surgical history.     Home Medications    Prior to Admission medications   Medication Sig Start Date End Date Taking? Authorizing Provider  predniSONE (DELTASONE) 10 MG tablet Take 6 tablets on day 1, 5 on day 2, 4 on day 3, 3 on day 4, 2 on day 5, 1 on day 6. 01/17/16   Melony Overly, MD  tamsulosin (FLOMAX) 0.4 MG CAPS capsule Take 2 capsules (0.8 mg total) by mouth daily. 02/11/16   Ledell Noss, MD  traMADol (ULTRAM) 50 MG tablet Take 1 tablet (50 mg total) by mouth every 6 (six) hours as needed for moderate pain. 01/17/16   Melony Overly, MD    Family History Family History  Problem Relation Age of Onset  . Cancer Father     Social History Social History  Substance Use Topics  . Smoking status: Current Every Day Smoker    Packs/day: 2.00    Years: 45.00    Types: Cigarettes    Start date: 01/09/1961  . Smokeless tobacco: Never Used     Comment: thinking about after settling in   . Alcohol use No     Allergies   Patient has no known allergies.   Review of Systems Review of Systems 10 Systems reviewed and are negative for acute change except as noted in the HPI.   Physical Exam Updated Vital Signs BP 127/79   Pulse 70   Temp 98 F (36.7 C) (Oral)   Resp 26   Ht 6\' 1"  (1.854 m)   Wt 205 lb (93 kg)   SpO2 96%   BMI 27.05 kg/m   Physical Exam  Constitutional: He is oriented to person, place, and time. He appears well-developed and well-nourished.  HENT:  Head: Normocephalic and atraumatic.  Eyes: Conjunctivae are normal.  Neck: Neck supple.  Cardiovascular: Normal rate and regular rhythm.   No murmur heard. Pulmonary/Chest: Effort normal and breath sounds normal. No respiratory distress.  Abdominal: Soft. There is no tenderness.  Musculoskeletal: He exhibits no edema or tenderness.  Neurological: He is alert and oriented to person, place, and time. He exhibits normal muscle tone. Coordination normal.  Skin: Skin is warm and dry.  Psychiatric: He has a normal mood and affect.  Nursing note and vitals  reviewed.    ED Treatments / Results  Labs (all labs ordered are listed, but only abnormal results are displayed) Labs Reviewed  BASIC METABOLIC PANEL - Abnormal; Notable for the following:       Result Value   Glucose, Bld 114 (*)    All other components within normal limits  CBC - Abnormal; Notable for the following:    WBC 14.1 (*)    RBC 5.86 (*)    Platelets 145 (*)    All other components within normal limits  HEPATIC FUNCTION PANEL - Abnormal; Notable for the following:    ALT 13 (*)    All other components within normal limits  ETHANOL  LIPASE, BLOOD  RAPID URINE DRUG SCREEN, HOSP PERFORMED  URINALYSIS, ROUTINE W REFLEX MICROSCOPIC  I-STAT TROPOININ, ED    EKG  EKG Interpretation  Date/Time:  Wednesday March 15 2016 21:28:10 EST Ventricular Rate:  81 PR Interval:    QRS  Duration: 120 QT Interval:  363 QTC Calculation: 422 R Axis:   -77 Text Interpretation:  Sinus rhythm Left anterior fascicular block Baseline wander in lead(s) V4 agree. no STEMI Confirmed by Johnney Killian, MD, Jeannie Done [phone]) on 03/15/2016 10:00:31 PM       Radiology Dg Chest 2 View  Result Date: 03/15/2016 CLINICAL DATA:  Substernal chest pressure and dyspnea all day. Syncopal episode around 20:00 EXAM: CHEST  2 VIEW COMPARISON:  01/19/15 FINDINGS: No consolidation. No effusion. Mild generalized interstitial coarsening, likely chronic. Hilar, mediastinal and cardiac contours are unremarkable and unchanged. Pulmonary vasculature is normal. IMPRESSION: No acute cardiopulmonary findings. Mild chronic appearing interstitial coarsening. Electronically Signed   By: Andreas Newport M.D.   On: 03/15/2016 22:06    Procedures Procedures (including critical care time)  Medications Ordered in ED Medications - No data to display   Initial Impression / Assessment and Plan / ED Course  I have reviewed the triage vital signs and the nursing notes.  Pertinent labs & imaging results that were available during my care of the patient were reviewed by me and considered in my medical decision making (see chart for details).      Final Clinical Impressions(s) / ED Diagnoses   Final diagnoses:  Syncope and collapse  Precordial chest pain  Left against medical advice  Patient describes chest tightness and shortness of breath followed by a syncopal episode. In the emergency department he does have stable vital signs. He is alert and appropriate. He has no respiratory distress. First set of cardiac enzymes negative EKG does not show STEMI. However with the patient's symptoms of chest discomfort dyspnea and syncope and felt that he should have overnight observation in the hospital. She reports that he has just moved to the area and has a rescue dog in his apartment. He reports that he has no one else to take care  of it he cannot leave the dog by itself. As he wishes to sign out [place] and will return if his symptoms worsen or change. He advises he will contact his family doctor in the morning to get follow-up  New Prescriptions New Prescriptions   No medications on file     Charlesetta Shanks, MD 03/15/16 2355

## 2016-03-15 NOTE — ED Notes (Signed)
Pt has been given a urinal and he is aware we need a urine sample

## 2016-03-15 NOTE — ED Triage Notes (Signed)
Pt brought in by [facility]. Pt reports walking to car and feeling light headed and short of breath. Pt states that he passed out in the yard. Pt was found by neighbors. Pt reports having chest pressure to left sided chest, shortness of breath, and lightheadedness. Pt given 324 of ASA on transport and 1 sublingual nitro. Pt reports chest pain is relieved and only pressure remains. Pt denies any pain radiation.

## 2016-03-16 LAB — RAPID URINE DRUG SCREEN, HOSP PERFORMED
Amphetamines: NOT DETECTED
BENZODIAZEPINES: NOT DETECTED
Barbiturates: NOT DETECTED
COCAINE: NOT DETECTED
OPIATES: NOT DETECTED
Tetrahydrocannabinol: NOT DETECTED

## 2016-03-16 LAB — URINALYSIS, ROUTINE W REFLEX MICROSCOPIC
Bilirubin Urine: NEGATIVE
GLUCOSE, UA: NEGATIVE mg/dL
Hgb urine dipstick: NEGATIVE
Ketones, ur: NEGATIVE mg/dL
Leukocytes, UA: NEGATIVE
Nitrite: NEGATIVE
PH: 6 (ref 5.0–8.0)
Protein, ur: NEGATIVE mg/dL
SPECIFIC GRAVITY, URINE: 1.014 (ref 1.005–1.030)

## 2016-03-16 NOTE — ED Notes (Signed)
Pt stable, understands discharge instructions, and reasons for return.   

## 2016-03-23 ENCOUNTER — Ambulatory Visit (INDEPENDENT_AMBULATORY_CARE_PROVIDER_SITE_OTHER): Payer: Medicare Other | Admitting: Internal Medicine

## 2016-03-23 ENCOUNTER — Encounter: Payer: Self-pay | Admitting: Internal Medicine

## 2016-03-23 ENCOUNTER — Telehealth: Payer: Self-pay | Admitting: Physical Therapy

## 2016-03-23 VITALS — BP 114/60 | HR 104 | Temp 98.1°F | Ht 73.0 in | Wt 207.7 lb

## 2016-03-23 DIAGNOSIS — Z79891 Long term (current) use of opiate analgesic: Secondary | ICD-10-CM | POA: Diagnosis not present

## 2016-03-23 DIAGNOSIS — M25511 Pain in right shoulder: Secondary | ICD-10-CM

## 2016-03-23 DIAGNOSIS — G8929 Other chronic pain: Secondary | ICD-10-CM

## 2016-03-23 DIAGNOSIS — F17210 Nicotine dependence, cigarettes, uncomplicated: Secondary | ICD-10-CM

## 2016-03-23 DIAGNOSIS — L209 Atopic dermatitis, unspecified: Secondary | ICD-10-CM

## 2016-03-23 MED ORDER — CETIRIZINE HCL 10 MG PO TABS: 10.0000 mg | ORAL_TABLET | Freq: Every day | ORAL | 11 refills | Status: DC

## 2016-03-23 MED ORDER — EUCERIN EX CREA: TOPICAL_CREAM | CUTANEOUS | 11 refills | Status: DC | PRN

## 2016-03-23 MED ORDER — HYDROXYZINE HCL 10 MG PO TABS: 10.0000 mg | ORAL_TABLET | Freq: Three times a day (TID) | ORAL | 0 refills | Status: DC | PRN

## 2016-03-23 NOTE — Progress Notes (Signed)
   CC: rash  HPI:  [name] is a 66 y.o. with past medical history as outlined below who presents to clinic for rash. He see comments as the patient's chronic medical issues.  Past Medical History:  Diagnosis Date  . Seasonal allergies     Review of Systems:  Rash that has worsened over the course of 1 month. Positive for chronic right shoulder pain.   Physical Exam:  Vitals:   03/23/16 1554  BP: 114/60  Pulse: (!) 104  Temp: 98.1 F (36.7 C)  TempSrc: Oral  SpO2: 95%  Weight: 207 lb 11.2 oz (94.2 kg)  Height: 6\' 1"  (1.854 m)   Physical Exam  Constitutional: appears well-developed and well-nourished. No distress.  HENT:  Head: Normocephalic and atraumatic.  Nose: Nose normal.  Neurological: alert and oriented to person, place, and time.  Skin: multiple 1-55mm pink papules over back and over b/l shoulders with some papules that have a red scab above them, chest is pink without any papules, dry skin noted. No rashes noted between fingers.    Assessment & Plan:   See Encounters Tab for problem based charting.  Patient seen with Dr. Dareen Piano

## 2016-03-23 NOTE — Telephone Encounter (Signed)
2/26, 3/1/, 03/14/16 attempted to call and unable to leave message

## 2016-03-23 NOTE — Telephone Encounter (Signed)
Attempted to call on 2/26, 2/27, 3/1 & 3/6 but unable to leave message to schedule PT eval at any time

## 2016-03-24 ENCOUNTER — Encounter: Payer: Self-pay | Admitting: Internal Medicine

## 2016-03-24 ENCOUNTER — Other Ambulatory Visit (HOSPITAL_COMMUNITY)
Admission: RE | Admit: 2016-03-24 | Discharge: 2016-03-24 | Disposition: A | Discharge status: Home / Self Care | Payer: Medicare Other | Source: Ambulatory Visit | Attending: Student in an Organized Health Care Education/Training Program | Admitting: Student in an Organized Health Care Education/Training Program

## 2016-03-24 ENCOUNTER — Ambulatory Visit (INDEPENDENT_AMBULATORY_CARE_PROVIDER_SITE_OTHER): Payer: Medicare Other | Admitting: Internal Medicine

## 2016-03-24 VITALS — BP 115/55 | HR 94 | Temp 98.0°F | Ht 73.0 in | Wt 210.9 lb

## 2016-03-24 DIAGNOSIS — L209 Atopic dermatitis, unspecified: Secondary | ICD-10-CM | POA: Insufficient documentation

## 2016-03-24 DIAGNOSIS — F17210 Nicotine dependence, cigarettes, uncomplicated: Secondary | ICD-10-CM | POA: Diagnosis not present

## 2016-03-24 DIAGNOSIS — M25511 Pain in right shoulder: Secondary | ICD-10-CM

## 2016-03-24 DIAGNOSIS — Z79891 Long term (current) use of opiate analgesic: Secondary | ICD-10-CM

## 2016-03-24 DIAGNOSIS — G8929 Other chronic pain: Secondary | ICD-10-CM | POA: Diagnosis not present

## 2016-03-24 DIAGNOSIS — L089 Local infection of the skin and subcutaneous tissue, unspecified: Secondary | ICD-10-CM | POA: Diagnosis not present

## 2016-03-24 MED ORDER — CETIRIZINE HCL 10 MG PO TABS: 10.0000 mg | ORAL_TABLET | Freq: Every day | ORAL | 11 refills | Status: DC

## 2016-03-24 MED ORDER — HYDROXYZINE HCL 10 MG PO TABS: 10.0000 mg | ORAL_TABLET | Freq: Three times a day (TID) | ORAL | 0 refills | Status: DC | PRN

## 2016-03-24 MED ORDER — TRIAMCINOLONE ACETONIDE 0.1 % EX CREA: 1.0000 "application " | TOPICAL_CREAM | Freq: Two times a day (BID) | CUTANEOUS | 0 refills | Status: DC

## 2016-03-24 MED ORDER — TRAMADOL HCL 50 MG PO TABS: 50.0000 mg | ORAL_TABLET | Freq: Four times a day (QID) | ORAL | 0 refills | Status: DC | PRN

## 2016-03-24 MED ORDER — EUCERIN EX CREA: TOPICAL_CREAM | CUTANEOUS | 11 refills | Status: DC | PRN

## 2016-03-24 NOTE — Assessment & Plan Note (Addendum)
Assessment: Patient was seen yesterday for a rash on his back that spread to his shoulders. He was prescribed Atarax, Zyrtec, and Eucerin cream which he has not been able to pick up. He states that the rash on his chest acutely worsened overnight when he developed hives and intense pruritus. He used applied alcohol to his chest to help with itching and then apply cortisone cream that helped improve pruritus. On exam the skin on his chest is blotchy with pink and white spots without any raised areas.  Plan: A punch biopsy was done and patient tolerated procedure well. We'll follow-up with results one week and call patient. given Rx for triamcinolone 0.1% cream to apply to rash.   Punch Biopsy Procedure Note  Pre-operative Diagnosis: rash  Locations:rt chest wall  Anesthesia: Lidocaine 1% without epinephrine   Procedure Details  History of allergy to lidocaine: no Sensitivity to epinephrine: no  Patient informed of the risks (including bleeding and infection) and benefits of the  procedure and verbal informed consent obtained.  The lesion and surrounding area was prepped with an alcohol swab. The skin was then stretched perpendicular to the skin tension lines and the lesion removed using the  45mmpunch. Antibiotic ointment and a sterile dressing applied. The specimen was sent for pathologic examination. The patient tolerated the procedure well.  Condition: Stable  Complications: none.  Plan: 1. Instructed to keep the wound dry and covered for 24-48 hours and clean thereafter. 2. Patient instructed to apply Vaseline daily until healed. 3. Warning signs of infection were reviewed.

## 2016-03-24 NOTE — Assessment & Plan Note (Signed)
A: Pt presents with rash that he has been seen previously for by his PCP in [place] who prescribed him hycerin cream. Pt states it has not been helping and the rash has now spread to his shoulders which brought him in to clinic today. Denies new medications, new foods, new soaps or detergents. He states that this rash has been present before he moved up to [place]. He lives alone and denies any contacts a rash similar to this. Denies any rash around his groin area. Patient has a history of seasonal allergies and has not been taking anything for it other than a decongestant. He does not believe that he has been bitten by any bugs.  Plan: Possible that rash is due atopic dermatitis. We'll prescribe Zyrtec daily, hydroxyzine 3 times a day when necessary for itching, and Eucerin cream. Referral placed for dermatology to rule out a malignancy.

## 2016-03-24 NOTE — Patient Instructions (Addendum)
Apply triamcinolone cream to your rash to help with itching.    Skin Biopsy, Care After Refer to this sheet in the next few weeks. These instructions provide you with information about caring for yourself after your procedure. Your health care provider may also give you more specific instructions. Your treatment has been planned according to current medical practices, but problems sometimes occur. Call your health care provider if you have any problems or questions after your procedure. What can I expect after the procedure? After the procedure, it is common to have:  Soreness.  Bruising.  Itching. Follow these instructions at home:  Rest and then return to your normal activities as told by your health care provider.  Take over-the-counter and prescription medicines only as told by your health care provider.  Follow instructions from your health care provider about how to take care of your biopsy site.Make sure you:  Wash your hands with soap and water before you change your bandage (dressing). If soap and water are not available, use hand sanitizer.  Change your dressing as told by your health care provider.  Leave stitches (sutures), skin glue, or adhesive strips in place. These skin closures may need to stay in place for 2 weeks or longer. If adhesive strip edges start to loosen and curl up, you may trim the loose edges. Do not remove adhesive strips completely unless your health care provider tells you to do that. If the biopsy area bleeds, apply gentle pressure for 10 minutes.  Check your biopsy site every day for signs of infection. Check for:  More redness, swelling, or pain.  More fluid or blood.  Warmth.  Pus or a bad smell.  Keep all follow-up visits as told by your health care provider. This is important. Contact a health care provider if:  You have more redness, swelling, or pain around your biopsy site.  You have more fluid or blood coming from your biopsy  site.  Your biopsy site feels warm to the touch.  You have pus or a bad smell coming from your biopsy site.  You have a fever. Get help right away if:  You have bleeding that does not stop with pressure or a dressing. This information is not intended to replace advice given to you by your health care provider. Make sure you discuss any questions you have with your health care provider. Document Released: 01/22/2015 Document Revised: 08/22/2015 Document Reviewed: 03/25/2014 Elsevier Interactive Patient Education  2017 [employer].

## 2016-03-24 NOTE — Assessment & Plan Note (Signed)
Assessment: Patient has history of chronic right shoulder pain which he is prescribed tramadol 50 mg every 6 when necessary. He has not been able to see his pcp and hasn't appointment to see her on April 12. He is requesting refill of his tramadol scan to this appointment.  Plan: Refilled one-month supply of tramadol. He has follow-up with his PCP already scheduled.

## 2016-03-24 NOTE — Progress Notes (Signed)
Internal Medicine Clinic Attending  I saw and evaluated the patient.  I personally confirmed the key portions of the history and exam documented by Dr. Hulen Luster and I reviewed pertinent patient test results.  The assessment, diagnosis, and plan were formulated together and I agree with the documentation in the resident's note. I was present for the entirety of the procedure.

## 2016-03-24 NOTE — Progress Notes (Signed)
   CC: rash  HPI:  [name] is a 66 y.o. with past medical history as outlined below who presents to clinic for follow-up of rash. He was seen yesterday and was able to pick up anything as medications. Please see problem was further details of patient's chronic medical issues.  Past Medical History:  Diagnosis Date  . Seasonal allergies     Review of Systems:  Patient states worsening of his rash overnight when he broke out in hives. States rash is itchy.  Physical Exam:  Vitals:   03/24/16 1042  BP: (!) 115/55  Pulse: 94  Temp: 98 F (36.7 C)  TempSrc: Oral  SpO2: 96%  Weight: 210 lb 14.4 oz (95.7 kg)  Height: 6\' 1"  (1.854 m)   Physical Exam  Constitutional: oriented to person, place, and time. appears well-developed and well-nourished. No distress.  HENT:  Head: Normocephalic and atraumatic.  Nose: Nose normal.  Neurological: alert and oriented to person, place, and time.  Skin: rash on back unchanged, rash on chest was light pink then white splotching started to appear while patient was describing events overnight. Very dry skin noted on medial side of arms and chest wall.    Assessment & Plan:   See Encounters Tab for problem based charting.  Patient seen with Dr. Evette Doffing

## 2016-03-28 ENCOUNTER — Other Ambulatory Visit: Payer: Self-pay

## 2016-03-29 NOTE — Progress Notes (Signed)
Internal Medicine Clinic Attending  I saw and evaluated the patient.  I personally confirmed the key portions of the history and exam documented by Dr. Truong and I reviewed pertinent patient test results.  The assessment, diagnosis, and plan were formulated together and I agree with the documentation in the resident's note.  

## 2016-04-18 ENCOUNTER — Telehealth: Payer: Self-pay | Admitting: *Deleted

## 2016-04-18 NOTE — Telephone Encounter (Signed)
SPOKE WITH MS MARTHA REGARDING REFERRAL. SHE STATES THAT OFFICE HAS TRIED TO CONTACT PATIENT X 3 UNABLE TO LEAVE MESSAGE (SEE PHONE NOTE) ON PATIENT'S PHONE.

## 2016-04-18 NOTE — Telephone Encounter (Signed)
SPOKE WITH PATIENT. PATIENT STATES HE IS NOT GOING TO DO ANY THERAPY UNTIL HE SPEAKS WITH HIS DR. HAS  APPOINTMENT FOR 4-12-018 @ 2:45PM

## 2016-04-20 ENCOUNTER — Ambulatory Visit (INDEPENDENT_AMBULATORY_CARE_PROVIDER_SITE_OTHER): Payer: Medicare Other | Admitting: Internal Medicine

## 2016-04-20 ENCOUNTER — Encounter: Payer: Self-pay | Admitting: Internal Medicine

## 2016-04-20 ENCOUNTER — Other Ambulatory Visit (HOSPITAL_COMMUNITY)
Admission: RE | Admit: 2016-04-20 | Discharge: 2016-04-20 | Disposition: A | Discharge status: Home / Self Care | Payer: Medicare Other | Source: Ambulatory Visit | Attending: Internal Medicine | Admitting: Internal Medicine

## 2016-04-20 VITALS — BP 110/72 | HR 98 | Temp 98.0°F | Ht 73.0 in | Wt 208.0 lb

## 2016-04-20 DIAGNOSIS — Z72821 Inadequate sleep hygiene: Secondary | ICD-10-CM | POA: Diagnosis not present

## 2016-04-20 DIAGNOSIS — G8929 Other chronic pain: Secondary | ICD-10-CM

## 2016-04-20 DIAGNOSIS — R39198 Other difficulties with micturition: Secondary | ICD-10-CM

## 2016-04-20 DIAGNOSIS — Z7251 High risk heterosexual behavior: Secondary | ICD-10-CM

## 2016-04-20 DIAGNOSIS — G479 Sleep disorder, unspecified: Secondary | ICD-10-CM

## 2016-04-20 DIAGNOSIS — F17210 Nicotine dependence, cigarettes, uncomplicated: Secondary | ICD-10-CM | POA: Diagnosis not present

## 2016-04-20 DIAGNOSIS — L209 Atopic dermatitis, unspecified: Secondary | ICD-10-CM | POA: Diagnosis not present

## 2016-04-20 DIAGNOSIS — Z114 Encounter for screening for human immunodeficiency virus [HIV]: Secondary | ICD-10-CM | POA: Diagnosis present

## 2016-04-20 DIAGNOSIS — M25511 Pain in right shoulder: Secondary | ICD-10-CM

## 2016-04-20 DIAGNOSIS — R350 Frequency of micturition: Secondary | ICD-10-CM | POA: Diagnosis not present

## 2016-04-20 DIAGNOSIS — Z0000 Encounter for general adult medical examination without abnormal findings: Secondary | ICD-10-CM

## 2016-04-20 MED ORDER — TRIAMCINOLONE ACETONIDE 0.1 % EX CREA: 1.0000 "application " | TOPICAL_CREAM | Freq: Two times a day (BID) | CUTANEOUS | 0 refills | Status: DC

## 2016-04-20 MED ORDER — CYCLOBENZAPRINE HCL 5 MG PO TABS: 5.0000 mg | ORAL_TABLET | Freq: Every day | ORAL | 0 refills | Status: DC

## 2016-04-20 NOTE — Patient Instructions (Addendum)
David Hendricks,  Please start taking flexeril at night 2-3 nights in the week for your muscle pain. On nights you do not take the flexeril you can continue taking hydroxyzine to help with sleep For shoulder pain I would recommend taking Aleve Please follow up with the physical therapy appointment to help improve your shoulder
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
## 2016-04-21 LAB — URINALYSIS, ROUTINE W REFLEX MICROSCOPIC
BILIRUBIN UA: NEGATIVE
Glucose, UA: NEGATIVE
KETONES UA: NEGATIVE
Leukocytes, UA: NEGATIVE
Nitrite, UA: NEGATIVE
PH UA: 5 (ref 5.0–7.5)
Protein, UA: NEGATIVE
RBC UA: NEGATIVE
SPEC GRAV UA: 1.014 (ref 1.005–1.030)
UUROB: 0.2 mg/dL (ref 0.2–1.0)

## 2016-04-21 LAB — RPR: RPR Ser Ql: NONREACTIVE

## 2016-04-21 LAB — HIV ANTIBODY (ROUTINE TESTING W REFLEX): HIV SCREEN 4TH GENERATION: NONREACTIVE

## 2016-04-22 DIAGNOSIS — G479 Sleep disorder, unspecified: Secondary | ICD-10-CM | POA: Insufficient documentation

## 2016-04-22 DIAGNOSIS — Z0000 Encounter for general adult medical examination without abnormal findings: Secondary | ICD-10-CM | POA: Insufficient documentation

## 2016-04-22 DIAGNOSIS — R39198 Other difficulties with micturition: Secondary | ICD-10-CM | POA: Insufficient documentation

## 2016-04-22 NOTE — Assessment & Plan Note (Addendum)
Assessment:  Atopic dermatitis Patient was seen in the clinic in March 2018 for diffuse rash on his back and shoulders. A punch biopsy was done which showed mild nonspecific chronic inflammation with subtle spongiosis. This can be seen in eczematous reactions including contact dermatitis or a new new nummular dermatitis.  Patient's results were discussed during this visit and patient was made aware of the pathology report. Patient reports significant improvement in his symptoms since using triamcinolone cream. On physical exam no rash was noted.   Plan -Continue triamcinolone cream

## 2016-04-22 NOTE — Assessment & Plan Note (Signed)
Assessment:  Difficulty sleeping We spent more than 5 minutes discussing sleep hygiene. Recommended not taking a nap after dinner at 6 PM. Recommended not watching TV prior to trying to sleep. Recommended turning off electronics 1 hour before bed and taking his hydroxyzine on the nights he does not take Flexeril to help with sleep.  Plan -Flexeril used for shoulder muscle pain will likely help with sleep as well  -Recommended taking hydroxyzine an hour before bed on nights he doesn't take Flexeril  -Discussed sleep hygiene

## 2016-04-22 NOTE — Assessment & Plan Note (Addendum)
Assessment:  Right shoulder pain On exam there is tenderness to palpation along the trapezius muscle at the inferior angle of the scapula. It was worse with extension and abduction. This appears to be musculoskeletal in nature. No signs of rotator cuff tear, bursitis or septic joint.  I think this would best be treated with a course of muscle relaxants and nonsteroidal anti-inflammatories. I told patient that I would not be refilling his tramadol for this issue. I recommended taking Flexeril 2-3 times in the week at nighttime and not to mix with tramadol. Also use ibuprofen as needed for pain to not exceed 2400 mg in the day and encouraged to follow-up with the physical therapy referral made for him in his previous clinic visit. He was agreeable to the plan.  Plan: -Flexeril use one 5 mg pill 2-3 times a week -Follow-up with physical therapy -Use ibuprofen for shoulder pain

## 2016-04-22 NOTE — Assessment & Plan Note (Addendum)
Assessment:  Urinary dysfunction Patient states that he has delayed ability to urinate after sexual intercourse.  He states it takes 5 minutes and then is able to urinate after sex. He denies urinary retention.  It is unclear how long these symptoms have been going on for as he is not able to give me a clear history and time line of any of his medical conditions. Will get a UA and also check for GC.  Patient was seen in the clinic on 02/2016 for urinary frequency and thought to be related to BPH. Will reassess patient in one month and if continues to have urinary symptoms will refer to urology.  Plan -Follow-up in one month -If continued symptoms refer to urology

## 2016-04-22 NOTE — Assessment & Plan Note (Signed)
Assessment:  Health care screening  Plan -HIV antibody screen - [facility]

## 2016-04-24 LAB — URINE CYTOLOGY ANCILLARY ONLY
CHLAMYDIA, DNA PROBE: NEGATIVE
Neisseria Gonorrhea: NEGATIVE

## 2016-04-24 NOTE — Addendum Note (Signed)
Addended by: Hulan Fray on: 04/24/2016 06:58 PM   Modules accepted: Orders

## 2016-04-25 NOTE — Progress Notes (Signed)
Internal Medicine Clinic Attending  Case discussed with Dr. Heber [place] at the time of the visit.  We reviewed the resident's history and exam and pertinent patient test results.  I agree with the assessment, diagnosis, and plan of care documented in the resident's note. Patient may have a rhomboid strain versus serratus anterior. Given the chronic nature I do think he will benefit from physical therapy as a first line treatment.  Dr Magdalene River did discuss this with him and he is agreeable, he previously had refused physical therapy as he did not understand how that would help.

## 2016-05-01 DIAGNOSIS — L308 Other specified dermatitis: Secondary | ICD-10-CM | POA: Diagnosis not present

## 2016-05-16 ENCOUNTER — Encounter: Payer: Self-pay | Admitting: Physical Therapy

## 2016-05-16 ENCOUNTER — Ambulatory Visit: Payer: Medicare Other | Attending: Transplant Surgery | Admitting: Physical Therapy

## 2016-05-16 DIAGNOSIS — M25611 Stiffness of right shoulder, not elsewhere classified: Secondary | ICD-10-CM | POA: Diagnosis not present

## 2016-05-16 DIAGNOSIS — M25511 Pain in right shoulder: Secondary | ICD-10-CM | POA: Diagnosis not present

## 2016-05-16 NOTE — Therapy (Signed)
[place], [place], [address] Phone: [phone]   Fax:  [phone]  Physical Therapy Evaluation  Patient Details  Name: David Hendricks MRN: [id number] Date of Birth: 1950-07-09 Referring Provider: Kalman Shan, DO  Encounter Date: 05/16/2016      PT End of Session - 05/16/16 1553    Visit Number 1   Date for PT Re-Evaluation 07/16/16   PT Start Time 1526   PT Stop Time 1620   PT Time Calculation (min) 54 min   Activity Tolerance Patient tolerated treatment well   Behavior During Therapy [facility] for tasks assessed/performed      Past Medical History:  Diagnosis Date  . Seasonal allergies     History reviewed. No pertinent surgical history.  There were no vitals filed for this visit.       Subjective Assessment - 05/16/16 1530    Subjective Patient reports that he has had some right shoulder pain since the late 80's where he had some pain and it would go away and then come and go, reports that the past few month he has had increased right shoulder pain..  Reports that he has had some x-rays showed degendrative changes.  He reports pain and tightness in the traps, the right shoulder and the low back   Limitations House hold activities;Lifting   Patient Stated Goals have less pain   Currently in Pain? Yes   Pain Score 7    Pain Location Shoulder   Pain Orientation Right   Pain Descriptors / Indicators Aching;Tightness;Spasm   Pain Type Acute pain   Pain Radiating Towards reports occasional numbnes in the right hand   Pain Onset More than a month ago   Pain Frequency Constant   Aggravating Factors  working on the computer, using the mouse, reaching out and back pain will get up to 8/10   Pain Relieving Factors rest, hot bath, 2 Aleve will decrease the pain to a 2/10   Effect of Pain on Daily Activities "just hurts"            [facility] PT Assessment - 05/16/16 0001      Assessment    Medical Diagnosis right shoulder pain   Referring Provider Kalman Shan, DO   Onset Date/Surgical Date 04/25/16   Prior Therapy no     Precautions   Precautions None     Balance Screen   Has the patient fallen in the past 6 months No   Has the patient had a decrease in activity level because of a fear of falling?  No   Is the patient reluctant to leave their home because of a fear of falling?  No     Home Environment   Additional Comments lives in an apartment     Prior Function   Level of Independence Independent   Vocation Retired   Leisure no exercise     Posture/Postural Control   Posture Comments fwd head, rounded shoulders     ROM / Strength   AROM / PROM / Strength AROM;Strength     AROM   AROM Assessment Site Shoulder   Right/Left Shoulder Right   Right Shoulder Flexion 140 Degrees   Right Shoulder ABduction 130 Degrees   Right Shoulder Internal Rotation 40 Degrees   Right Shoulder External Rotation 60 Degrees     Strength   Overall Strength Comments 4/5 with pain in the shoulder and in the right scapular area  Palpation   Palpation comment he is tight iwth spasms and tenderness in the right upper trap the rhomboid, the infraspinatus and the teres     Special Tests    Special Tests --  negative empty can, + impingment                   [facility] Adult PT Treatment/Exercise - 2016-06-05 0001      Modalities   Modalities Moist Heat;Electrical Stimulation     Moist Heat Therapy   Number Minutes Moist Heat 15 Minutes   Moist Heat Location Shoulder     Electrical Stimulation   Electrical Stimulation Location right shoulder scapular area   Electrical Stimulation Action IFC   Electrical Stimulation Parameters supine   Electrical Stimulation Goals Pain                  PT Short Term Goals - 2016-06-05 1558      PT SHORT TERM GOAL #1   Title independent iwth initial HEP   Time 2   Period Weeks   Status New           PT Long  Term Goals - 2016/06/05 1558      PT LONG TERM GOAL #1   Title understand proper posture and body mechanics   Time 8   Period Weeks   Status New     PT LONG TERM GOAL #2   Title decrease pain 50%   Time 8   Period Weeks   Status New     PT LONG TERM GOAL #3   Title report able to use the computer without pain > 5/10   Time 8   Period Weeks   Status New     PT LONG TERM GOAL #4   Title increase ROM of right shoulder IR to 65 degrees   Time 8   Period Weeks   Status New               Plan - 2016-06-05 1554    Clinical Impression Statement Patient reports that he has had some right shoulder issues since the late 80's.  He reports that about 3 weeks ago he had increased pain in the right shoulder, reports it is worse with using the computer and reaching out.  He has pretty good ROM, strength was 4/5 with pain, he has significant tenderness and spasms in the upper trap the rhomboids and the teres area.  + impingement and negative empty can tests   Rehab Potential Good   PT Frequency 2x / week   PT Duration 8 weeks   PT Treatment/Interventions ADLs/Self Care Home Management;Electrical Stimulation;Iontophoresis 4mg /ml Dexamethasone;Moist Heat;Ultrasound;Therapeutic activities;Therapeutic exercise;Neuromuscular re-education;Patient/family education;Manual techniques;Taping   PT Next Visit Plan Slowly add exercises, the spasms seem to be his biggest issue at this time, issue an HEP for scapular exercises   Consulted and Agree with Plan of Care Patient      Patient will benefit from skilled therapeutic intervention in order to improve the following deficits and impairments:  Decreased range of motion, Decreased strength, Increased muscle spasms, Postural dysfunction, Improper body mechanics, Pain  Visit Diagnosis: Acute pain of right shoulder - Plan: PT plan of care cert/re-cert  Stiffness of right shoulder, not elsewhere classified - Plan: PT plan of care cert/re-cert       G-Codes - Jun 05, 2016 1600-04-27    Functional Assessment Tool Used (Outpatient Only) foto 44% limitation   Functional Limitation Carrying, moving and handling objects  Carrying, Moving and Handling Objects Current Status [phone]) At least 40 percent but less than 60 percent impaired, limited or restricted   Carrying, Moving and Handling Objects Goal Status ([id number]) At least 20 percent but less than 40 percent impaired, limited or restricted       Problem List Patient Active Problem List   Diagnosis Date Noted  . Difficulty sleeping 04/22/2016  . Periodic health assessment, general screening, adult 04/22/2016  . Urinary dysfunction 04/22/2016  . Atopic dermatitis 03/23/2016  . Urinary frequency 02/11/2016  . Right shoulder pain 02/11/2016    Sumner Boast., PT 05/16/2016, 4:05 PM  [place] [address] [place], [place], [address] Phone: [phone]   Fax:  [phone]  Name: David Hendricks MRN: [id number] Date of Birth: Jan 19, 1950

## 2016-05-23 ENCOUNTER — Ambulatory Visit: Payer: Medicare Other | Admitting: Physical Therapy

## 2016-05-23 ENCOUNTER — Encounter: Payer: Self-pay | Admitting: Physical Therapy

## 2016-05-23 DIAGNOSIS — M25611 Stiffness of right shoulder, not elsewhere classified: Secondary | ICD-10-CM

## 2016-05-23 DIAGNOSIS — M25511 Pain in right shoulder: Secondary | ICD-10-CM

## 2016-05-23 NOTE — Therapy (Signed)
[place], [place], [address] Phone: [phone]   Fax:  [phone]  Physical Therapy Treatment  Patient Details  Name: David Hendricks MRN: [id number] Date of Birth: Apr 29, 1950 Referring Provider: Kalman Shan, DO  Encounter Date: 05/23/2016      PT End of Session - 05/23/16 1408    Visit Number 2   Date for PT Re-Evaluation 07/16/16   PT Start Time 1345   PT Stop Time 1410   PT Time Calculation (min) 25 min   Activity Tolerance Patient tolerated treatment well   Behavior During Therapy [facility] for tasks assessed/performed      Past Medical History:  Diagnosis Date  . Seasonal allergies     History reviewed. No pertinent surgical history.  There were no vitals filed for this visit.      Subjective Assessment - 05/23/16 1346    Subjective Pt reports no pain, shoulder is doing fine   Pain Score 0-No pain                         [facility] Adult PT Treatment/Exercise - 05/23/16 0001      Exercises   Exercises Shoulder     Shoulder Exercises: Seated   Other Seated Exercises Rows & lats 35lb 2x10     Shoulder Exercises: Standing   Flexion 15 reps  x2   Shoulder Flexion Weight (lbs) 2   ABduction 15 reps;Weights;Both;10 reps   Shoulder ABduction Weight (lbs) 1   Extension 15 reps;Theraband;Both   Theraband Level (Shoulder Extension) Level 3 (Green)   Row 15 reps;Theraband;Both  x2   Theraband Level (Shoulder Row) Level 3 (Green)     Shoulder Exercises: ROM/Strengthening   UBE (Upper Arm Bike) L4 19frd/3rev                PT Education - 05/23/16 1408    Education Details HEP, shoulder ext, rows, ER   Person(s) Educated Patient   Methods Explanation;Demonstration;Handout;Verbal cues;Tactile cues   Comprehension Returned demonstration;Verbalized understanding          PT Short Term Goals - 05/16/16 1558      PT SHORT TERM GOAL #1   Title independent  iwth initial HEP   Time 2   Period Weeks   Status New           PT Long Term Goals - 05/16/16 1558      PT LONG TERM GOAL #1   Title understand proper posture and body mechanics   Time 8   Period Weeks   Status New     PT LONG TERM GOAL #2   Title decrease pain 50%   Time 8   Period Weeks   Status New     PT LONG TERM GOAL #3   Title report able to use the computer without pain > 5/10   Time 8   Period Weeks   Status New     PT LONG TERM GOAL #4   Title increase ROM of right shoulder IR to 65 degrees   Time 8   Period Weeks   Status New               Plan - 05/23/16 1410    Clinical Impression Statement Pt able to complete all of today's exercises. Pt moves at a fast pace so constant cues needed to get pt to slow down with the exercises. Pt does reports  some R shoulder pain with standing abduction.    Rehab Potential Good   PT Frequency 2x / week   PT Duration 8 weeks   PT Treatment/Interventions ADLs/Self Care Home Management;Electrical Stimulation;Iontophoresis 4mg /ml Dexamethasone;Moist Heat;Ultrasound;Therapeutic activities;Therapeutic exercise;Neuromuscular re-education;Patient/family education;Manual techniques;Taping   PT Next Visit Plan Slowly add exercises, check spasms      Patient will benefit from skilled therapeutic intervention in order to improve the following deficits and impairments:  Decreased range of motion, Decreased strength, Increased muscle spasms, Postural dysfunction, Improper body mechanics, Pain  Visit Diagnosis: Acute pain of right shoulder  Stiffness of right shoulder, not elsewhere classified     Problem List Patient Active Problem List   Diagnosis Date Noted  . Difficulty sleeping 04/22/2016  . Periodic health assessment, general screening, adult 04/22/2016  . Urinary dysfunction 04/22/2016  . Atopic dermatitis 03/23/2016  . Urinary frequency 02/11/2016  . Right shoulder pain 02/11/2016    Scot Jun,  PTA 05/23/2016, 2:12 PM  [place], [place], [address] Phone: [phone]   Fax:  [phone]  Name: David Hendricks MRN: [id number] Date of Birth: 1950/01/14

## 2016-05-24 DIAGNOSIS — H353131 Nonexudative age-related macular degeneration, bilateral, early dry stage: Secondary | ICD-10-CM | POA: Diagnosis not present

## 2016-05-25 ENCOUNTER — Ambulatory Visit: Payer: Medicare Other | Admitting: Physical Therapy

## 2016-05-25 ENCOUNTER — Encounter: Payer: Self-pay | Admitting: Physical Therapy

## 2016-05-25 DIAGNOSIS — M25511 Pain in right shoulder: Secondary | ICD-10-CM

## 2016-05-25 DIAGNOSIS — M25611 Stiffness of right shoulder, not elsewhere classified: Secondary | ICD-10-CM | POA: Diagnosis not present

## 2016-05-25 NOTE — Therapy (Signed)
[place], [place], [address] Phone: [phone]   Fax:  [phone]  Physical Therapy Treatment  Patient Details  Name: David Hendricks MRN: [id number] Date of Birth: 10-17-1950 Referring Provider: Kalman Shan, DO  Encounter Date: 05/25/2016      PT End of Session - 05/25/16 1548    Visit Number 3   Date for PT Re-Evaluation 07/16/16   PT Start Time 2244   PT Stop Time 1548   PT Time Calculation (min) 33 min   Activity Tolerance Patient tolerated treatment well   Behavior During Therapy [facility] for tasks assessed/performed;Anxious      Past Medical History:  Diagnosis Date  . Seasonal allergies     History reviewed. No pertinent surgical history.  There were no vitals filed for this visit.      Subjective Assessment - 05/25/16 1518    Subjective "Doing pretty good, It is not feeling that bad"   Currently in Pain? No/denies   Pain Score 0-No pain            [facility] PT Assessment - 05/25/16 0001      AROM   AROM Assessment Site Shoulder   Right/Left Shoulder Right   Right Shoulder Flexion 153 Degrees   Right Shoulder ABduction 160 Degrees   Right Shoulder Internal Rotation 52 Degrees   Right Shoulder External Rotation 92 Degrees                     [facility] Adult PT Treatment/Exercise - 05/25/16 0001      Shoulder Exercises: Seated   Other Seated Exercises Rows & lats 35lb 2x15     Shoulder Exercises: Standing   Flexion 10 reps;Strengthening  x2, cane, with 5lb   ABduction 10 reps;Theraband  x2   Theraband Level (Shoulder ABduction) Level 2 (Red)   Other Standing Exercises bicept curls, tri ext  35lb  2x10   Other Standing Exercises wall pushups 2x15     Shoulder Exercises: ROM/Strengthening   UBE (Upper Arm Bike) L5 56fd/3rev                  PT Short Term Goals - 05/25/16 1540      PT SHORT TERM GOAL #1   Title independent iwth initial HEP    Status Achieved           PT Long Term Goals - 05/25/16 1540      PT LONG TERM GOAL #1   Title understand proper posture and body mechanics   Status Partially Met     PT LONG TERM GOAL #2   Title decrease pain 50%   Status Partially Met     PT LONG TERM GOAL #3   Title report able to use the computer without pain > 5/10   Status On-going     PT LONG TERM GOAL #4   Title increase ROM of right shoulder IR to 65 degrees   Status On-going               Plan - 05/25/16 1548    Clinical Impression Statement frequent cues required for pacing and to get pt to slow down during interventions. Pt has progressed increasing his R shoulder AROM in all directions. Pt has good strength and ROM. Continues to reports some pain when using his  computer at home.   Rehab Potential Good   PT Frequency 2x / week   PT Duration  8 weeks   PT Treatment/Interventions ADLs/Self Care Home Management;Electrical Stimulation;Iontophoresis 78m/ml Dexamethasone;Moist Heat;Ultrasound;Therapeutic activities;Therapeutic exercise;Neuromuscular re-education;Patient/family education;Manual techniques;Taping   PT Next Visit Plan Slowly add exercises, check spasms      Patient will benefit from skilled therapeutic intervention in order to improve the following deficits and impairments:  Decreased range of motion, Decreased strength, Increased muscle spasms, Postural dysfunction, Improper body mechanics, Pain  Visit Diagnosis: Acute pain of right shoulder  Stiffness of right shoulder, not elsewhere classified     Problem List Patient Active Problem List   Diagnosis Date Noted  . Difficulty sleeping 04/22/2016  . Periodic health assessment, general screening, adult 04/22/2016  . Urinary dysfunction 04/22/2016  . Atopic dermatitis 03/23/2016  . Urinary frequency 02/11/2016  . Right shoulder pain 02/11/2016    RScot Jun PTA 05/25/2016, 3:51 PM  [place] [address]Phone: [phone]  Fax:  [phone] Name: David GiambalvoMRN: [id number] of Birth: 503/19/1952

## 2016-05-30 ENCOUNTER — Encounter: Payer: Self-pay | Admitting: Physical Therapy

## 2016-05-30 ENCOUNTER — Ambulatory Visit: Payer: Medicare Other | Admitting: Physical Therapy

## 2016-05-30 DIAGNOSIS — M25511 Pain in right shoulder: Secondary | ICD-10-CM

## 2016-05-30 DIAGNOSIS — M25611 Stiffness of right shoulder, not elsewhere classified: Secondary | ICD-10-CM

## 2016-05-30 NOTE — Therapy (Signed)
[place], [place], [address] Phone: [phone]   Fax:  [phone]  Physical Therapy Treatment  Patient Details  Name: David Hendricks MRN: [id number] Date of Birth: December 24, 1950 Referring Provider: Kalman Shan, DO  Encounter Date: 05/30/2016      PT End of Session - 05/30/16 1450    Visit Number 4   Date for PT Re-Evaluation 07/16/16   PT Start Time 8088   PT Stop Time 1502   PT Time Calculation (min) 49 min   Activity Tolerance Patient limited by pain   Behavior During Therapy [facility] for tasks assessed/performed;Anxious      Past Medical History:  Diagnosis Date  . Seasonal allergies     History reviewed. No pertinent surgical history.  There were no vitals filed for this visit.      Subjective Assessment - 05/30/16 1428    Subjective "not sure if you are going to get much out of me today my shoulder is about a 6". Pt reports that he does not know why he is having so much pain.   Currently in Pain? Yes   Pain Score 6    Pain Location Shoulder   Pain Orientation Right                         [facility] Adult PT Treatment/Exercise - 05/30/16 0001      Shoulder Exercises: Seated   Other Seated Exercises Rows & lats 25lb 2x10     Shoulder Exercises: Standing   Extension Theraband;Both;10 reps   Theraband Level (Shoulder Extension) Level 2 (Red)     Shoulder Exercises: ROM/Strengthening   UBE (Upper Arm Bike) L5 68fd/3rev     Modalities   Modalities Moist Heat;Electrical Stimulation     Moist Heat Therapy   Number Minutes Moist Heat 15 Minutes   Moist Heat Location Shoulder     Electrical Stimulation   Electrical Stimulation Location right shoulder scapular area   Electrical Stimulation Action IFC   Electrical Stimulation Parameters supine   Electrical Stimulation Goals Pain                  PT Short Term Goals - 05/30/16 1450      PT SHORT TERM  GOAL #1   Title independent iwth initial HEP   Status Achieved           PT Long Term Goals - 05/30/16 1450      PT LONG TERM GOAL #1   Title understand proper posture and body mechanics   Status Partially Met     PT LONG TERM GOAL #2   Title decrease pain 50%     PT LONG TERM GOAL #3   Title report able to use the computer without pain > 5/10   Status On-going               Plan - 05/30/16 1448    Clinical Impression Statement Pt enters clinic reports a constant 6/10 pain with no known cause. Pt able to perform a few exercises despite constant pain. Applies e-Stim and HP to help with pain.   Rehab Potential Good   PT Frequency 2x / week   PT Duration 8 weeks   PT Treatment/Interventions ADLs/Self Care Home Management;Electrical Stimulation;Iontophoresis 490mml Dexamethasone;Moist Heat;Ultrasound;Therapeutic activities;Therapeutic exercise;Neuromuscular re-education;Patient/family education;Manual techniques;Taping   PT Next Visit Plan Slowly add exercises see if modalities helped with pain.  Patient will benefit from skilled therapeutic intervention in order to improve the following deficits and impairments:  Decreased range of motion, Decreased strength, Increased muscle spasms, Postural dysfunction, Improper body mechanics, Pain  Visit Diagnosis: Stiffness of right shoulder, not elsewhere classified  Acute pain of right shoulder     Problem List Patient Active Problem List   Diagnosis Date Noted  . Difficulty sleeping 04/22/2016  . Periodic health assessment, general screening, adult 04/22/2016  . Urinary dysfunction 04/22/2016  . Atopic dermatitis 03/23/2016  . Urinary frequency 02/11/2016  . Right shoulder pain 02/11/2016    Scot Jun, PTA 05/30/2016, 2:52 PM  [place], [place], [address] Phone: [phone]   Fax:  [phone]  Name: David Hendricks MRN: [id number] Date of Birth: 05/22/1950

## 2016-06-01 ENCOUNTER — Encounter: Payer: Self-pay | Admitting: Physical Therapy

## 2016-06-01 ENCOUNTER — Ambulatory Visit: Payer: Medicare Other | Admitting: Physical Therapy

## 2016-06-01 DIAGNOSIS — M25611 Stiffness of right shoulder, not elsewhere classified: Secondary | ICD-10-CM

## 2016-06-01 DIAGNOSIS — M25511 Pain in right shoulder: Secondary | ICD-10-CM | POA: Diagnosis not present

## 2016-06-01 NOTE — Therapy (Signed)
[place], [place], [address] Phone: [phone]   Fax:  [phone]  Physical Therapy Treatment  Patient Details  Name: David Hendricks MRN: [id number] Date of Birth: Sep 18, 1950 Referring Provider: Kalman Shan, DO  Encounter Date: 06/01/2016      PT End of Session - 06/01/16 1545    Visit Number 5   Date for PT Re-Evaluation 07/16/16   PT Start Time 6433   PT Stop Time 1545   PT Time Calculation (min) 30 min   Activity Tolerance Patient tolerated treatment well   Behavior During Therapy [facility] for tasks assessed/performed;Anxious      Past Medical History:  Diagnosis Date  . Seasonal allergies     History reviewed. No pertinent surgical history.  There were no vitals filed for this visit.      Subjective Assessment - 06/01/16 1520    Subjective Pt reports that he is doing a lot better compared to last time.   Currently in Pain? No/denies   Pain Score 0-No pain                         [facility] Adult PT Treatment/Exercise - 06/01/16 0001      Shoulder Exercises: Seated   Other Seated Exercises Rows & lats 25lb 2x10   Other Seated Exercises chest press 10 2x15      Shoulder Exercises: Standing   External Rotation 10 reps;Theraband;Both  x2   Theraband Level (Shoulder External Rotation) Level 2 (Red)   Flexion Strengthening;Both;15 reps;Weights  x2   Shoulder Flexion Weight (lbs) 2   ABduction 15 reps;Weights;Both  x2   Shoulder ABduction Weight (lbs) 2   Extension Theraband;Both;20 reps   Theraband Level (Shoulder Extension) Level 3 (Green)   Row Theraband;Both;20 reps   Theraband Level (Shoulder Row) Level 3 (Green)   Other Standing Exercises wall pushups 2x15     Shoulder Exercises: ROM/Strengthening   UBE (Upper Arm Bike) L6 65fd/3rev                  PT Short Term Goals - 05/30/16 1450      PT SHORT TERM GOAL #1   Title independent iwth  initial HEP   Status Achieved           PT Long Term Goals - 05/30/16 1450      PT LONG TERM GOAL #1   Title understand proper posture and body mechanics   Status Partially Met     PT LONG TERM GOAL #2   Title decrease pain 50%     PT LONG TERM GOAL #3   Title report able to use the computer without pain > 5/10   Status On-going               Plan - 06/01/16 1546    Clinical Impression Statement Pt reports that he feels better overall, no issues with today's activities only fatigue. Again cues needed to get pt to slow down and rest between sets.   Rehab Potential Good   PT Frequency 2x / week   PT Duration 8 weeks   PT Treatment/Interventions Fluidtherapy   PT Next Visit Plan Slowly add exercises      Patient will benefit from skilled therapeutic intervention in order to improve the following deficits and impairments:  Decreased range of motion, Decreased strength, Increased muscle spasms, Postural dysfunction, Improper body mechanics, Pain  Visit Diagnosis: Stiffness  of right shoulder, not elsewhere classified  Acute pain of right shoulder     Problem List Patient Active Problem List   Diagnosis Date Noted  . Difficulty sleeping 04/22/2016  . Periodic health assessment, general screening, adult 04/22/2016  . Urinary dysfunction 04/22/2016  . Atopic dermatitis 03/23/2016  . Urinary frequency 02/11/2016  . Right shoulder pain 02/11/2016    Scot Jun 06/01/2016, 3:47 PM  [place] [address] [place], [place], [address] Phone: [phone]   Fax:  [phone]  Name: David Hendricks MRN: [id number] Date of Birth: 1951/01/05

## 2016-06-02 DIAGNOSIS — H029 Unspecified disorder of eyelid: Secondary | ICD-10-CM | POA: Diagnosis not present

## 2016-06-06 ENCOUNTER — Ambulatory Visit: Payer: Medicare Other | Admitting: Physical Therapy

## 2016-06-08 ENCOUNTER — Encounter: Payer: Self-pay | Admitting: Physical Therapy

## 2016-06-08 ENCOUNTER — Ambulatory Visit: Payer: Medicare Other | Admitting: Physical Therapy

## 2016-06-08 DIAGNOSIS — M25611 Stiffness of right shoulder, not elsewhere classified: Secondary | ICD-10-CM | POA: Diagnosis not present

## 2016-06-08 DIAGNOSIS — M25511 Pain in right shoulder: Secondary | ICD-10-CM | POA: Diagnosis not present

## 2016-06-08 NOTE — Therapy (Signed)
[place], [place], [address] Phone: [phone]   Fax:  [phone]  Physical Therapy Treatment  Patient Details  Name: David Hendricks MRN: [id number] Date of Birth: Jul 23, 1950 Referring Provider: Kalman Shan, DO  Encounter Date: 06/08/2016      PT End of Session - 06/08/16 1415    Visit Number 6   Date for PT Re-Evaluation 07/16/16   PT Start Time 9449   PT Stop Time 1415   PT Time Calculation (min) 32 min   Activity Tolerance Patient tolerated treatment well   Behavior During Therapy [facility] for tasks assessed/performed;Anxious      Past Medical History:  Diagnosis Date  . Seasonal allergies     History reviewed. No pertinent surgical history.  There were no vitals filed for this visit.      Subjective Assessment - 06/08/16 1341    Subjective "Doing pretty good"   Currently in Pain? No/denies   Pain Score 0-No pain                         [facility] Adult PT Treatment/Exercise - 06/08/16 0001      Shoulder Exercises: Seated   Other Seated Exercises Rows & lats 35lb 2x10   Other Seated Exercises chest press 20 2x15      Shoulder Exercises: Standing   Flexion Strengthening;Both;15 reps;Weights  x2   Shoulder Flexion Weight (lbs) 3   ABduction 15 reps;Weights;Both  x2    Shoulder ABduction Weight (lbs) 3   Extension Theraband;Both;15 reps  x2   Theraband Level (Shoulder Extension) Level 3 (Green)   Row Theraband;Both;15 reps  x2   Theraband Level (Shoulder Row) Level 3 (Green)   Other Standing Exercises bicept curls, tri ext  35lb  2x10   Other Standing Exercises wall pushups 2x15     Shoulder Exercises: ROM/Strengthening   UBE (Upper Arm Bike) L6 82fd/3rev                  PT Short Term Goals - 05/30/16 1450      PT SHORT TERM GOAL #1   Title independent iwth initial HEP   Status Achieved           PT Long Term Goals - 06/08/16 1417       PT LONG TERM GOAL #1   Title understand proper posture and body mechanics   Status Partially Met     PT LONG TERM GOAL #2   Title decrease pain 50%   Status Partially Met     PT LONG TERM GOAL #3   Title report able to use the computer without pain > 5/10   Status On-going     PT LONG TERM GOAL #4   Title increase ROM of right shoulder IR to 65 degrees   Status On-going               Plan - 06/08/16 1416    Clinical Impression Statement Pt is progressing towards all goals, completed all of today's activities without increase pain.   Rehab Potential Good   PT Frequency 2x / week   PT Duration 8 weeks   PT Treatment/Interventions ADLs/Self Care Home Management;Electrical Stimulation;Iontophoresis 459mml Dexamethasone;Moist Heat;Ultrasound;Therapeutic activities;Therapeutic exercise;Neuromuscular re-education;Passive range of motion   PT Next Visit Plan progress as tolerated, possible d/c      Patient will benefit from skilled therapeutic intervention in order to improve the following deficits  and impairments:  Decreased range of motion, Decreased strength, Increased muscle spasms, Postural dysfunction, Improper body mechanics, Pain  Visit Diagnosis: Acute pain of right shoulder  Stiffness of right shoulder, not elsewhere classified     Problem List Patient Active Problem List   Diagnosis Date Noted  . Difficulty sleeping 04/22/2016  . Periodic health assessment, general screening, adult 04/22/2016  . Urinary dysfunction 04/22/2016  . Atopic dermatitis 03/23/2016  . Urinary frequency 02/11/2016  . Right shoulder pain 02/11/2016    Scot Jun 06/08/2016, 2:18 PM  [place] [address] [place], [place], [address] Phone: [phone]   Fax:  [phone]  Name: Rashard Ryle MRN: [id number] Date of Birth: 04-Feb-1950

## 2016-06-13 ENCOUNTER — Encounter: Payer: Self-pay | Admitting: Physical Therapy

## 2016-06-13 ENCOUNTER — Ambulatory Visit: Payer: Medicare Other | Attending: Transplant Surgery | Admitting: Physical Therapy

## 2016-06-13 DIAGNOSIS — M25511 Pain in right shoulder: Secondary | ICD-10-CM | POA: Diagnosis not present

## 2016-06-13 DIAGNOSIS — M25611 Stiffness of right shoulder, not elsewhere classified: Secondary | ICD-10-CM | POA: Diagnosis not present

## 2016-06-13 NOTE — Therapy (Signed)
[place], [place], [address] Phone: [phone]   Fax:  [phone]  Physical Therapy Treatment  Patient Details  Name: David Hendricks MRN: [id number] Date of Birth: September 06, 1950 Referring Provider: Kalman Shan, DO  Encounter Date: 06/13/2016      PT End of Session - 06/13/16 1600    Visit Number 7   Date for PT Re-Evaluation 07/16/16   PT Start Time 1525   PT Stop Time 1605   PT Time Calculation (min) 40 min   Activity Tolerance Patient tolerated treatment well   Behavior During Therapy [facility] for tasks assessed/performed;Anxious      Past Medical History:  Diagnosis Date  . Seasonal allergies     History reviewed. No pertinent surgical history.  There were no vitals filed for this visit.      Subjective Assessment - 06/13/16 1526    Subjective " I just still have pain in the shoulder at times" reports "mainly when using the mouse on the computer"   Currently in Pain? No/denies   Pain Score 0-No pain                         [facility] Adult PT Treatment/Exercise - 06/13/16 0001      Shoulder Exercises: Seated   Other Seated Exercises Rows & lats 35lb 2x10   Other Seated Exercises chest press 20 2x15 , bentover 5# row, reverse flies with 3# and bent over extension with 5#     Shoulder Exercises: Standing   Other Standing Exercises bicept curls, tri ext  35lb  2x10   Other Standing Exercises wall pushups 2x15, 10# 2 way scap stabilization, 5# ER of the shoulders with the pulley     Shoulder Exercises: ROM/Strengthening   UBE (Upper Arm Bike) L6 40fd/3rev                  PT Short Term Goals - 05/30/16 1450      PT SHORT TERM GOAL #1   Title independent iwth initial HEP   Status Achieved           PT Long Term Goals - 06/08/16 1417      PT LONG TERM GOAL #1   Title understand proper posture and body mechanics   Status Partially Met     PT LONG  TERM GOAL #2   Title decrease pain 50%   Status Partially Met     PT LONG TERM GOAL #3   Title report able to use the computer without pain > 5/10   Status On-going     PT LONG TERM GOAL #4   Title increase ROM of right shoulder IR to 65 degrees   Status On-going               Plan - 06/13/16 1601    Clinical Impression Statement Patient needs a lot of cues to remember exercises and keep good form, tends to go too fast so cues to go slow.  Seems to report only pain with computer use.   Clinical Presentation Stable   Clinical Decision Making Low   PT Next Visit Plan asked him to think about going to a gym and start some exercise at home catuioned him on low weight only 5# and below.  Will need good instructions to be safe if he goes to a gym   Consulted and Agree with Plan of Care Patient  Patient will benefit from skilled therapeutic intervention in order to improve the following deficits and impairments:  Decreased range of motion, Decreased strength, Increased muscle spasms, Postural dysfunction, Improper body mechanics, Pain  Visit Diagnosis: Acute pain of right shoulder  Stiffness of right shoulder, not elsewhere classified     Problem List Patient Active Problem List   Diagnosis Date Noted  . Difficulty sleeping 04/22/2016  . Periodic health assessment, general screening, adult 04/22/2016  . Urinary dysfunction 04/22/2016  . Atopic dermatitis 03/23/2016  . Urinary frequency 02/11/2016  . Right shoulder pain 02/11/2016    Sumner Boast., PT 06/13/2016, 4:03 PM  [place] [address] [place], [place], [address] Phone: [phone]   Fax:  [phone]  Name: David Hendricks MRN: [id number] Date of Birth: [birth year]

## 2016-06-15 ENCOUNTER — Ambulatory Visit: Payer: Medicare Other | Admitting: Physical Therapy

## 2016-06-15 ENCOUNTER — Encounter: Payer: Self-pay | Admitting: Physical Therapy

## 2016-06-15 DIAGNOSIS — M25611 Stiffness of right shoulder, not elsewhere classified: Secondary | ICD-10-CM

## 2016-06-15 DIAGNOSIS — M25511 Pain in right shoulder: Secondary | ICD-10-CM

## 2016-06-15 NOTE — Therapy (Signed)
[place], [place], [address] Phone: [phone]   Fax:  [phone]  Physical Therapy Treatment  Patient Details  Name: Oval Cavazos MRN: [id number] Date of Birth: 1950-06-04 Referring Provider: Kalman Shan, DO  Encounter Date: 06/15/2016      PT End of Session - 06/15/16 1602    Visit Number 8   Date for PT Re-Evaluation 07/16/16   PT Start Time 0932   PT Stop Time 1602   PT Time Calculation (min) 39 min   Activity Tolerance Patient tolerated treatment well   Behavior During Therapy [facility] for tasks assessed/performed;Anxious      Past Medical History:  Diagnosis Date  . Seasonal allergies     History reviewed. No pertinent surgical history.  There were no vitals filed for this visit.      Subjective Assessment - 06/15/16 1531    Subjective Patient reports some increased shoulder pain today, unsure why   Currently in Pain? Yes   Pain Score 2    Pain Location Shoulder   Pain Orientation Right                         [facility] Adult PT Treatment/Exercise - 06/15/16 0001      Shoulder Exercises: Seated   Other Seated Exercises Rows & lats 35lb 2x10   Other Seated Exercises chest press 20 2x15 , bentover 5# row, reverse flies with 3# and bent over extension with 5#     Shoulder Exercises: Standing   Other Standing Exercises bicept curls, tri ext  35lb  2x10   Other Standing Exercises wall pushups 2x15, 10# 2 way scap stabilization, 5# ER of the shoulders with the pulley     Shoulder Exercises: ROM/Strengthening   UBE (Upper Arm Bike) L6 33fd/3rev   Wall Pushups 20 reps   Pushups Limitations wide and narrow hands   Other ROM/Strengthening Exercises overhead 8# carry 100 feet x 2                  PT Short Term Goals - 05/30/16 1450      PT SHORT TERM GOAL #1   Title independent iwth initial HEP   Status Achieved           PT Long Term Goals -  06/15/16 1604      PT LONG TERM GOAL #2   Title decrease pain 50%   Status Partially Met     PT LONG TERM GOAL #3   Title report able to use the computer without pain > 5/10   Status Partially Met               Plan - 06/15/16 1603    Clinical Impression Statement Continues to require cues for form and for compensation that he does.  Overall c/o minimal pain during the workout   PT Next Visit Plan see 1x next week, may need to educate on safety in gym and with home HEP   Consulted and Agree with Plan of Care Patient      Patient will benefit from skilled therapeutic intervention in order to improve the following deficits and impairments:  Decreased range of motion, Decreased strength, Increased muscle spasms, Postural dysfunction, Improper body mechanics, Pain  Visit Diagnosis: Acute pain of right shoulder  Stiffness of right shoulder, not elsewhere classified     Problem List Patient Active Problem List   Diagnosis Date Noted  .  Difficulty sleeping 04/22/2016  . Periodic health assessment, general screening, adult 04/22/2016  . Urinary dysfunction 04/22/2016  . Atopic dermatitis 03/23/2016  . Urinary frequency 02/11/2016  . Right shoulder pain 02/11/2016    Sumner Boast., PT 06/15/2016, 4:04 PM  [place] [address] [place], [place], [address] Phone: [phone]   Fax:  [phone]  Name: Zygmunt Mcglinn MRN: [id number] Date of Birth: 05/30/1950

## 2016-06-20 ENCOUNTER — Ambulatory Visit: Payer: Medicare Other | Admitting: Physical Therapy

## 2016-06-20 ENCOUNTER — Encounter: Payer: Self-pay | Admitting: Physical Therapy

## 2016-06-20 DIAGNOSIS — M25611 Stiffness of right shoulder, not elsewhere classified: Secondary | ICD-10-CM | POA: Diagnosis not present

## 2016-06-20 DIAGNOSIS — M25511 Pain in right shoulder: Secondary | ICD-10-CM

## 2016-06-20 NOTE — Therapy (Addendum)
[place], [place], [address] Phone: [phone]   Fax:  [phone]  Physical Therapy Treatment  Patient Details  Name: Sylvestre Rathgeber MRN: [id number] Date of Birth: 15-Jan-1950 Referring Provider: Kalman Shan, DO  Encounter Date: 06/20/2016      PT End of Session - 06/20/16 1332    Visit Number 9   Date for PT Re-Evaluation 07/16/16   PT Start Time 1300   PT Stop Time 1332   PT Time Calculation (min) 32 min      Past Medical History:  Diagnosis Date  . Seasonal allergies     History reviewed. No pertinent surgical history.  There were no vitals filed for this visit.      Subjective Assessment - 06/20/16 1259    Subjective "Doing good, I think part of my pain comes from the way I sleep"   Currently in Pain? No/denies   Pain Score 0-No pain                         [facility] Adult PT Treatment/Exercise - 06/20/16 0001      Shoulder Exercises: Seated   Other Seated Exercises Rows & lats 35lb 2x15   Other Seated Exercises chest press 20 2x15 , bentover 5# row, reverse flies with 3# and bent over extension with 5#     Shoulder Exercises: Standing   Flexion Strengthening;Both;10 reps;15 reps;Theraband  x2   Theraband Level (Shoulder Flexion) Level 2 (Red)   ABduction 15 reps;Both;Theraband  x2   Theraband Level (Shoulder ABduction) Level 2 (Red)   Other Standing Exercises bicept curls, tri ext  35lb  2x10   Other Standing Exercises 10# 2 way scap stabilization, 5# ER of the shoulders with the pulley     Shoulder Exercises: ROM/Strengthening   UBE (Upper Arm Bike) L6 12fd/3rev   Wall Pushups 20 reps   Pushups Limitations wide and narrow hands   Other ROM/Strengthening Exercises overhead 8# carry 100 feet x 2                  PT Short Term Goals - 05/30/16 1450      PT SHORT TERM GOAL #1   Title independent iwth initial HEP   Status Achieved            PT Long Term Goals - 06/20/16 1332      PT LONG TERM GOAL #1   Title understand proper posture and body mechanics   Status Partially Met     PT LONG TERM GOAL #2   Title decrease pain 50%   Status Partially Met     PT LONG TERM GOAL #3   Title report able to use the computer without pain > 5/10   Status Partially Met     PT LONG TERM GOAL #4   Title increase ROM of right shoulder IR to 65 degrees   Status On-going               Plan - 06/20/16 1333    Clinical Impression Statement Does reports some pain with resisted abduction and overhead carry. Cues provided to prebent trunk lean with abduction.    Rehab Potential Good   PT Frequency 2x / week   PT Duration 8 weeks   PT Treatment/Interventions ADLs/Self Care Home Management;Electrical Stimulation;Iontophoresis 413mml Dexamethasone;Moist Heat;Ultrasound;Therapeutic activities;Therapeutic exercise;Neuromuscular re-education;Passive range of motion   PT Next Visit Plan see 1x next  week, educate on safety in gym and with home HEP      Patient will benefit from skilled therapeutic intervention in order to improve the following deficits and impairments:  Decreased range of motion, Decreased strength, Increased muscle spasms, Postural dysfunction, Improper body mechanics, Pain  Visit Diagnosis: Acute pain of right shoulder  Stiffness of right shoulder, not elsewhere classified     Problem List Patient Active Problem List   Diagnosis Date Noted  . Difficulty sleeping 04/22/2016  . Periodic health assessment, general screening, adult 04/22/2016  . Urinary dysfunction 04/22/2016  . Atopic dermatitis 03/23/2016  . Urinary frequency 02/11/2016  . Right shoulder pain 02/11/2016   PHYSICAL THERAPY DISCHARGE SUMMARY  Visits from Start of Care: 9   Plan: Patient agrees to discharge.  Patient goals were partially met. Patient is being discharged due to lack of progress.  ?????       Scot Jun, PTA 06/20/2016, 1:35 PM  [place], [place], [address] Phone: [phone]   Fax:  [phone]  Name: Macklin Jacquin MRN: [id number] Date of Birth: 1950/02/22

## 2016-06-22 DIAGNOSIS — D485 Neoplasm of uncertain behavior of skin: Secondary | ICD-10-CM | POA: Diagnosis not present

## 2016-06-22 DIAGNOSIS — D367 Benign neoplasm of other specified sites: Secondary | ICD-10-CM | POA: Diagnosis not present

## 2016-08-08 ENCOUNTER — Ambulatory Visit (HOSPITAL_COMMUNITY)
Admission: RE | Admit: 2016-08-08 | Discharge: 2016-08-08 | Disposition: A | Discharge status: Home / Self Care | Payer: Medicare Other | Source: Ambulatory Visit | Attending: Student in an Organized Health Care Education/Training Program | Admitting: Student in an Organized Health Care Education/Training Program

## 2016-08-08 ENCOUNTER — Encounter: Payer: Self-pay | Admitting: Internal Medicine

## 2016-08-08 ENCOUNTER — Ambulatory Visit (INDEPENDENT_AMBULATORY_CARE_PROVIDER_SITE_OTHER): Payer: Medicare Other | Admitting: Internal Medicine

## 2016-08-08 VITALS — BP 114/65 | HR 88 | Temp 97.9°F | Ht 73.0 in | Wt 207.8 lb

## 2016-08-08 DIAGNOSIS — R1031 Right lower quadrant pain: Secondary | ICD-10-CM | POA: Diagnosis not present

## 2016-08-08 DIAGNOSIS — N200 Calculus of kidney: Secondary | ICD-10-CM | POA: Insufficient documentation

## 2016-08-08 DIAGNOSIS — N201 Calculus of ureter: Secondary | ICD-10-CM | POA: Diagnosis not present

## 2016-08-08 DIAGNOSIS — R300 Dysuria: Secondary | ICD-10-CM | POA: Diagnosis not present

## 2016-08-08 LAB — BASIC METABOLIC PANEL
Anion gap: 8 (ref 5–15)
BUN: 12 mg/dL (ref 6–20)
CALCIUM: 9.2 mg/dL (ref 8.9–10.3)
CO2: 28 mmol/L (ref 22–32)
CREATININE: 0.98 mg/dL (ref 0.61–1.24)
Chloride: 104 mmol/L (ref 101–111)
GFR calc Af Amer: >60 mL/min (ref >60)
GLUCOSE: 81 mg/dL (ref 65–99)
Potassium: 3.8 mmol/L (ref 3.5–5.1)
Sodium: 140 mmol/L (ref 135–145)

## 2016-08-08 MED ORDER — TRAMADOL HCL 50 MG PO TABS: 50.0000 mg | ORAL_TABLET | Freq: Four times a day (QID) | ORAL | 0 refills | Status: DC | PRN

## 2016-08-08 NOTE — Patient Instructions (Signed)
David Hendricks,  It was a pleasure seeing you today. Please obtain your CT scan to better assess if you have kidney stones. Please follow up with me on August 16 for your regular appointment. In the meantime please visit the acute care clinic if her symptoms do not improve or get worse.

## 2016-08-08 NOTE — Progress Notes (Signed)
   CC: Right-sided groin pain  HPI:  [name] is a 66 y.o. with history noted below that presents to the acute care clinic for 5 day history of right-sided groin pain.  Patient states that he has a constant nagging pain that becomes sharp when he eats. He states that he has had kidney stones in the past and this feels like he is passing a kidney stone. He has associated symptoms of nausea and vomiting and burning with urination. He states he has vomited his food once every night except for last night.  He states eating makes the pain worse and tramadol has made the pain better.  Patient denies fever/chills, abdominal pain or back pain.  Past Medical History:  Diagnosis Date  . Seasonal allergies     Review of Systems:  Review of Systems  Constitutional: Negative for chills and fever.  Respiratory: Negative for shortness of breath.   Cardiovascular: Negative for chest pain.  Gastrointestinal: Negative for abdominal pain and diarrhea.  Genitourinary: Positive for dysuria.  Musculoskeletal: Negative for back pain.     Physical Exam:  Vitals:   08/08/16 0843  BP: 114/65  Pulse: 88  Temp: 97.9 F (36.6 C)  TempSrc: Oral  Weight: 207 lb 12.8 oz (94.3 kg)   Physical Exam  Cardiovascular: Normal rate, regular rhythm, normal heart sounds and intact distal pulses.  Exam reveals no gallop and no friction rub.   No murmur heard. Pulmonary/Chest: Effort normal and breath sounds normal. No respiratory distress. He has no wheezes. He has no rales.  Abdominal: Soft. Bowel sounds are normal. He exhibits no distension and no mass. There is no tenderness. There is no rebound and no guarding.  Musculoskeletal:  Right groin mildly tender to palpation  Skin: Skin is warm and dry.     Assessment & Plan:   See encounters tab for problem based medical decision making.    Patient discussed with Dr. Evette Doffing

## 2016-08-09 ENCOUNTER — Telehealth: Payer: Self-pay | Admitting: Internal Medicine

## 2016-08-09 LAB — MICROSCOPIC EXAMINATION
Bacteria, UA: NONE SEEN
CASTS: NONE SEEN /LPF

## 2016-08-09 LAB — URINALYSIS, ROUTINE W REFLEX MICROSCOPIC
Bilirubin, UA: NEGATIVE
Glucose, UA: NEGATIVE
Nitrite, UA: NEGATIVE
PH UA: 5 (ref 5.0–7.5)
PROTEIN UA: NEGATIVE
RBC, UA: NEGATIVE
Specific Gravity, UA: 1.024 (ref 1.005–1.030)
Urobilinogen, Ur: 0.2 mg/dL (ref 0.2–1.0)

## 2016-08-09 NOTE — Telephone Encounter (Signed)
Called patient to discuss results of CT scan.  Left voicemail that he could call the clinic for results.

## 2016-08-10 ENCOUNTER — Telehealth: Payer: Self-pay

## 2016-08-10 DIAGNOSIS — R1031 Right lower quadrant pain: Secondary | ICD-10-CM | POA: Insufficient documentation

## 2016-08-10 NOTE — Telephone Encounter (Signed)
Requesting lab results. Please call pt back.  

## 2016-08-10 NOTE — Assessment & Plan Note (Addendum)
Assessment:  Right sided groin pain Patient states he has a history of passing kidney stones and states that the pain he has been experiencing is similar.  He has associated symptoms of nausea/vomiting and dysuria. Urine dipstick was negative for nitrates, leukocytes and blood.  Will get a CT non contrast of abdomen and pelvis to assess kidney stones.   Plan -CT abdomen/pelvis -UA -tramadol  Addendum CT abdomen/pelvis showing 62mm stone a the right UVJ about to pass into bladder UA without signs of infection

## 2016-08-10 NOTE — Telephone Encounter (Signed)
Called Mr. Hoel to discuss CT results. No answer. Left voicemail to call back the office. You may let him know that he has a kidney stone that appears is going to pass soon. It is small enough to pass. He should continue his flomax and use his pain medication as needed.

## 2016-08-11 NOTE — Progress Notes (Signed)
Internal Medicine Clinic Attending  Case discussed with Dr. Heber [place]  at the time of the visit.  We reviewed the resident's history and exam and pertinent patient test results.  I agree with the assessment, diagnosis, and plan of care documented in the resident's note.  CT confirms a 61mm stone that is already on its way to passing itself, so I anticipate this will be self limited and tamsulosin will help. 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

## 2016-08-24 ENCOUNTER — Encounter: Payer: Self-pay | Admitting: Internal Medicine

## 2016-08-24 ENCOUNTER — Ambulatory Visit (INDEPENDENT_AMBULATORY_CARE_PROVIDER_SITE_OTHER): Payer: Medicare Other | Admitting: Internal Medicine

## 2016-08-24 VITALS — BP 101/60 | HR 97 | Temp 97.8°F | Ht 73.0 in | Wt 202.4 lb

## 2016-08-24 DIAGNOSIS — Z716 Tobacco abuse counseling: Secondary | ICD-10-CM

## 2016-08-24 DIAGNOSIS — Z720 Tobacco use: Secondary | ICD-10-CM

## 2016-08-24 DIAGNOSIS — F17210 Nicotine dependence, cigarettes, uncomplicated: Secondary | ICD-10-CM

## 2016-08-24 MED ORDER — NICOTINE POLACRILEX 4 MG MT GUM: 4.0000 mg | CHEWING_GUM | OROMUCOSAL | 0 refills | Status: DC | PRN

## 2016-08-24 MED ORDER — BUPROPION HCL ER (SR) 150 MG PO TB12: 150.0000 mg | ORAL_TABLET | Freq: Two times a day (BID) | ORAL | 2 refills | Status: DC

## 2016-08-24 NOTE — Patient Instructions (Signed)
Mr. Rico,  Congratulations on the decision to quit smoking.  Please start taking bupropion 150 mg once daily for 3 days then increase to 150 mg twice daily (maximum dose: 300 mg/day) Please start using your nicotine gum  Please follow up in 3 months

## 2016-08-24 NOTE — Progress Notes (Signed)
   CC: History of tobacco abuse and wants to Quit smoking  HPI:  [name] is a 66 y.o. male with history noted below presents to the internal medicine clinic to help with smoking cessation. He states he has been smoking since he was 66 years old and currently smokes 2 packs a day. He has tried nicotine patches in the past and was able to quit for 12 days. That was the longest he's been without a cigarette. He states that anxiety triggers his smoking. He states that he mainly smokes when sitting in front of the TV.  Past Medical History:  Diagnosis Date  . Seasonal allergies     Review of Systems:  Review of Systems  Constitutional: Negative for malaise/fatigue.  Respiratory: Negative for cough and shortness of breath.   Cardiovascular: Negative for chest pain and leg swelling.  Musculoskeletal: Negative for myalgias.     Physical Exam:  Vitals:   08/24/16 1355  BP: 101/60  Pulse: 97  Temp: 97.8 F (36.6 C)  TempSrc: Oral  SpO2: 96%  Weight: 202 lb 6.4 oz (91.8 kg)  Height: 6\' 1"  (1.854 m)   Physical Exam  Constitutional: He is well-developed, well-nourished, and in no distress.  Cardiovascular: Normal rate, regular rhythm and normal heart sounds.  Exam reveals no gallop and no friction rub.   No murmur heard. Pulmonary/Chest: Effort normal and breath sounds normal. No respiratory distress. He has no wheezes. He has no rales.  Abdominal: Soft. He exhibits no distension. There is no tenderness.  Skin: Skin is warm and dry.    Assessment & Plan:   See encounters tab for problem based medical decision making.   Patient discussed with Dr. Daryll Drown

## 2016-08-28 DIAGNOSIS — Z720 Tobacco use: Secondary | ICD-10-CM | POA: Insufficient documentation

## 2016-08-28 NOTE — Assessment & Plan Note (Signed)
Assessment:  Smoking cessation Patient states he smokes 2 packs of cigarettes a day and has been for the past 53 years. Discussed with patient starting  Wellbutrin and nicotine gum.  Discussed behavioral modifications to his smoking routine. Stated that he would likely need an activity to do while watching TV to replace his smoking routine.    Plan -Bupropion titrating up to 300 mg daily -Nicotine gum - reassess progress at next visit

## 2016-08-30 NOTE — Progress Notes (Signed)
Internal Medicine Clinic Attending  Case discussed with Dr. Hoffman at the time of the visit.  We reviewed the resident's history and exam and pertinent patient test results.  I agree with the assessment, diagnosis, and plan of care documented in the resident's note.  

## 2016-09-01 ENCOUNTER — Telehealth: Payer: Self-pay

## 2016-09-01 NOTE — Telephone Encounter (Signed)
Per patient the nicotine gum is not working, requesting nicotine patch to be filled. Please call pt back.

## 2016-09-04 NOTE — Telephone Encounter (Signed)
Pt is calling back asking for a refill on nicotine patch. Please call pt back.

## 2016-09-05 ENCOUNTER — Other Ambulatory Visit: Payer: Self-pay | Admitting: Internal Medicine

## 2016-09-05 MED ORDER — NICOTINE 21 MG/24HR TD PT24: 21.0000 mg | MEDICATED_PATCH | TRANSDERMAL | 1 refills | Status: DC

## 2016-09-05 NOTE — Telephone Encounter (Signed)
I sent nicotine patch to his pharmacy Thanks

## 2016-09-19 ENCOUNTER — Telehealth: Payer: Self-pay | Admitting: *Deleted

## 2016-09-19 NOTE — Telephone Encounter (Signed)
Called pt this morning - stated he had called for Nicotine patches b/c the gum was not working for him; thought the patches would work better.. Stated he has not received the patches; had called [facility] pharmacy 4 times. He had sample patches but has ran out. And now he feels like he "going backward; has to start all over again". I called [facility] - talked to Aaron Edelman - rx has been received; on hold; stated maybe b/c he did not pick them up. But stated the patches are not covered by Part D; will cost $41.00 as opposed to $20.00 OTC at [facility]. I called pt back - informed of what Aaron Edelman had stated. Stated he will call [facility] to have it transferred just to see what it will cost or otherwise, he will purchase it OTC. And will call back to let me know what he decided.

## 2016-10-06 ENCOUNTER — Telehealth: Payer: Self-pay | Admitting: *Deleted

## 2016-10-06 NOTE — Telephone Encounter (Signed)
Pt called office of pt experience and left vm that he has been unable to medication refills. Last [facility] note indicates his request for Nicotine replacement was answered by triage nurse on 09/19/16 - will forward this concern apparently r/t smoking cessation to Pharm D for advice/guidance when she returns to clinic on Monday, Oct. 1 since phone message rec'd at 4:40 P this Friday. Yvonna Alanis, RN, 10/06/16, 4:45 P

## 2016-10-09 ENCOUNTER — Inpatient Hospital Stay (HOSPITAL_COMMUNITY)
Admission: EM | Admit: 2016-10-09 | Discharge: 2016-10-10 | DRG: 247 | Disposition: A | Discharge status: Home Health | Payer: Medicare Other | Attending: Interventional Cardiology | Admitting: Interventional Cardiology

## 2016-10-09 ENCOUNTER — Encounter (HOSPITAL_COMMUNITY): Payer: Self-pay | Admitting: Emergency Medicine

## 2016-10-09 ENCOUNTER — Emergency Department (HOSPITAL_COMMUNITY): Payer: Medicare Other

## 2016-10-09 ENCOUNTER — Inpatient Hospital Stay (HOSPITAL_COMMUNITY)
Admission: EM | Disposition: A | Discharge status: Home Health | Payer: Self-pay | Source: Home / Self Care | Attending: Interventional Cardiology

## 2016-10-09 DIAGNOSIS — I2510 Atherosclerotic heart disease of native coronary artery without angina pectoris: Secondary | ICD-10-CM | POA: Diagnosis present

## 2016-10-09 DIAGNOSIS — K5900 Constipation, unspecified: Secondary | ICD-10-CM | POA: Diagnosis present

## 2016-10-09 DIAGNOSIS — I252 Old myocardial infarction: Secondary | ICD-10-CM | POA: Diagnosis present

## 2016-10-09 DIAGNOSIS — I214 Non-ST elevation (NSTEMI) myocardial infarction: Secondary | ICD-10-CM

## 2016-10-09 DIAGNOSIS — E785 Hyperlipidemia, unspecified: Secondary | ICD-10-CM

## 2016-10-09 DIAGNOSIS — Z955 Presence of coronary angioplasty implant and graft: Secondary | ICD-10-CM

## 2016-10-09 DIAGNOSIS — I249 Acute ischemic heart disease, unspecified: Secondary | ICD-10-CM | POA: Diagnosis not present

## 2016-10-09 DIAGNOSIS — Z720 Tobacco use: Secondary | ICD-10-CM | POA: Diagnosis present

## 2016-10-09 DIAGNOSIS — Z79899 Other long term (current) drug therapy: Secondary | ICD-10-CM

## 2016-10-09 DIAGNOSIS — R079 Chest pain, unspecified: Secondary | ICD-10-CM | POA: Diagnosis not present

## 2016-10-09 DIAGNOSIS — F17210 Nicotine dependence, cigarettes, uncomplicated: Secondary | ICD-10-CM | POA: Diagnosis not present

## 2016-10-09 HISTORY — PX: CORONARY ANGIOPLASTY: SHX604

## 2016-10-09 HISTORY — PX: LEFT HEART CATH AND CORONARY ANGIOGRAPHY: CATH118249

## 2016-10-09 HISTORY — DX: Atherosclerotic heart disease of native coronary artery without angina pectoris: I25.10

## 2016-10-09 HISTORY — DX: Cardiac arrhythmia, unspecified: I49.9

## 2016-10-09 HISTORY — PX: CORONARY ULTRASOUND/IVUS: CATH118244

## 2016-10-09 HISTORY — DX: Anxiety disorder, unspecified: F41.9

## 2016-10-09 HISTORY — DX: Acute myocardial infarction, unspecified: I21.9

## 2016-10-09 HISTORY — DX: Unspecified osteoarthritis, unspecified site: M19.90

## 2016-10-09 HISTORY — PX: CORONARY STENT INTERVENTION: CATH118234

## 2016-10-09 LAB — PROTIME-INR
INR: 0.99
Prothrombin Time: 13 seconds (ref 11.4–15.2)

## 2016-10-09 LAB — BASIC METABOLIC PANEL
Anion gap: 5 (ref 5–15)
BUN: 15 mg/dL (ref 6–20)
CHLORIDE: 104 mmol/L (ref 101–111)
CO2: 28 mmol/L (ref 22–32)
Calcium: 8.6 mg/dL — ABNORMAL LOW (ref 8.9–10.3)
Creatinine, Ser: 1.08 mg/dL (ref 0.61–1.24)
Glucose, Bld: 117 mg/dL — ABNORMAL HIGH (ref 65–99)
POTASSIUM: 3.6 mmol/L (ref 3.5–5.1)
SODIUM: 137 mmol/L (ref 135–145)

## 2016-10-09 LAB — TROPONIN I
Troponin I: 0.67 ng/mL (ref <0.03)
Troponin I: 0.23 ng/mL (ref <0.03)

## 2016-10-09 LAB — I-STAT TROPONIN, ED
TROPONIN I, POC: 0.58 ng/mL — AB (ref 0.00–0.08)
Troponin i, poc: 0.05 ng/mL (ref 0.00–0.08)

## 2016-10-09 LAB — POCT ACTIVATED CLOTTING TIME
ACTIVATED CLOTTING TIME: 241 s
ACTIVATED CLOTTING TIME: 263 s
ACTIVATED CLOTTING TIME: 290 s

## 2016-10-09 LAB — LIPID PANEL
CHOL/HDL RATIO: 5 ratio
Cholesterol: 181 mg/dL (ref 0–200)
HDL: 36 mg/dL — AB (ref >40)
LDL Cholesterol: 125 mg/dL — ABNORMAL HIGH (ref 0–99)
TRIGLYCERIDES: 99 mg/dL (ref <150)
VLDL: 20 mg/dL (ref 0–40)

## 2016-10-09 LAB — D-DIMER, QUANTITATIVE (NOT AT ARMC): D DIMER QUANT: 0.47 ug{FEU}/mL (ref 0.00–0.50)

## 2016-10-09 LAB — CBC
HEMATOCRIT: 47.8 % (ref 39.0–52.0)
Hemoglobin: 16.4 g/dL (ref 13.0–17.0)
MCH: 28.8 pg (ref 26.0–34.0)
MCHC: 34.3 g/dL (ref 30.0–36.0)
MCV: 83.9 fL (ref 78.0–100.0)
PLATELETS: 146 10*3/uL — AB (ref 150–400)
RBC: 5.7 MIL/uL (ref 4.22–5.81)
RDW: 13.5 % (ref 11.5–15.5)
WBC: 9.9 10*3/uL (ref 4.0–10.5)

## 2016-10-09 LAB — HEMOGLOBIN A1C
HEMOGLOBIN A1C: 5.1 % (ref 4.8–5.6)
MEAN PLASMA GLUCOSE: 99.67 mg/dL

## 2016-10-09 LAB — HEPARIN LEVEL (UNFRACTIONATED): Heparin Unfractionated: 0.14 IU/mL — ABNORMAL LOW (ref 0.30–0.70)

## 2016-10-09 SURGERY — LEFT HEART CATH AND CORONARY ANGIOGRAPHY
Anesthesia: LOCAL

## 2016-10-09 MED ORDER — HEPARIN BOLUS VIA INFUSION
4000.0000 [IU] | Freq: Once | INTRAVENOUS | Status: AC
  Administered 2016-10-09: 4000 [IU] via INTRAVENOUS
  Filled 2016-10-09: qty 4000

## 2016-10-09 MED ORDER — ACETAMINOPHEN 325 MG PO TABS: 650.0000 mg | ORAL_TABLET | ORAL | Status: DC | PRN

## 2016-10-09 MED ORDER — FENTANYL CITRATE (PF) 100 MCG/2ML IJ SOLN
INTRAMUSCULAR | Status: AC
  Filled 2016-10-09: qty 2

## 2016-10-09 MED ORDER — TAMSULOSIN HCL 0.4 MG PO CAPS
0.8000 mg | ORAL_CAPSULE | Freq: Every day | ORAL | Status: DC
  Administered 2016-10-09: 0.8 mg via ORAL
  Filled 2016-10-09 (×2): qty 2

## 2016-10-09 MED ORDER — SODIUM CHLORIDE 0.9 % WEIGHT BASED INFUSION: 1.0000 mL/kg/h | INTRAVENOUS | Status: DC

## 2016-10-09 MED ORDER — BUPROPION HCL ER (SR) 150 MG PO TB12
150.0000 mg | ORAL_TABLET | Freq: Every day | ORAL | Status: DC
  Administered 2016-10-09 – 2016-10-10 (×2): 150 mg via ORAL
  Filled 2016-10-09 (×2): qty 1

## 2016-10-09 MED ORDER — ASPIRIN 81 MG PO CHEW: 81.0000 mg | CHEWABLE_TABLET | ORAL | Status: DC

## 2016-10-09 MED ORDER — SODIUM CHLORIDE 0.9 % WEIGHT BASED INFUSION: 3.0000 mL/kg/h | INTRAVENOUS | Status: DC

## 2016-10-09 MED ORDER — TIROFIBAN HCL IN NACL 5-0.9 MG/100ML-% IV SOLN
INTRAVENOUS | Status: AC
Start: 2016-10-09 — End: 2016-10-09
  Filled 2016-10-09: qty 100

## 2016-10-09 MED ORDER — NITROGLYCERIN 1 MG/10 ML FOR IR/CATH LAB
INTRA_ARTERIAL | Status: DC | PRN
  Administered 2016-10-09: 200 ug via INTRACORONARY

## 2016-10-09 MED ORDER — SODIUM CHLORIDE 0.9% FLUSH
3.0000 mL | Freq: Two times a day (BID) | INTRAVENOUS | Status: DC
  Administered 2016-10-09: 3 mL via INTRAVENOUS

## 2016-10-09 MED ORDER — TICAGRELOR 90 MG PO TABS
90.0000 mg | ORAL_TABLET | Freq: Two times a day (BID) | ORAL | Status: DC
  Administered 2016-10-09 – 2016-10-10 (×2): 90 mg via ORAL
  Filled 2016-10-09 (×2): qty 1

## 2016-10-09 MED ORDER — TIROFIBAN HCL IN NACL 5-0.9 MG/100ML-% IV SOLN
INTRAVENOUS | Status: DC | PRN
  Administered 2016-10-09: 0.15 ug/kg/min via INTRAVENOUS

## 2016-10-09 MED ORDER — NITROGLYCERIN 0.4 MG SL SUBL: 0.4000 mg | SUBLINGUAL_TABLET | SUBLINGUAL | Status: DC | PRN

## 2016-10-09 MED ORDER — NICOTINE 21 MG/24HR TD PT24
21.0000 mg | MEDICATED_PATCH | TRANSDERMAL | Status: DC
  Administered 2016-10-09: 21 mg via TRANSDERMAL
  Filled 2016-10-09: qty 1

## 2016-10-09 MED ORDER — HEPARIN SODIUM (PORCINE) 1000 UNIT/ML IJ SOLN
INTRAMUSCULAR | Status: AC
Start: 2016-10-09 — End: 2016-10-09
  Filled 2016-10-09: qty 1

## 2016-10-09 MED ORDER — TICAGRELOR 90 MG PO TABS
ORAL_TABLET | ORAL | Status: AC
  Filled 2016-10-09: qty 2

## 2016-10-09 MED ORDER — NITROGLYCERIN 1 MG/10 ML FOR IR/CATH LAB
INTRA_ARTERIAL | Status: AC
  Filled 2016-10-09: qty 10

## 2016-10-09 MED ORDER — SODIUM CHLORIDE 0.9 % IV SOLN
250.0000 mL | INTRAVENOUS | Status: DC | PRN
Start: 2016-10-09 — End: 2016-10-09

## 2016-10-09 MED ORDER — ANGIOPLASTY BOOK
Freq: Once | Status: AC
  Administered 2016-10-09: 23:00:00
  Filled 2016-10-09: qty 1

## 2016-10-09 MED ORDER — FENTANYL CITRATE (PF) 100 MCG/2ML IJ SOLN
INTRAMUSCULAR | Status: DC | PRN
  Administered 2016-10-09: 25 ug via INTRAVENOUS

## 2016-10-09 MED ORDER — ATORVASTATIN CALCIUM 80 MG PO TABS
80.0000 mg | ORAL_TABLET | Freq: Every day | ORAL | Status: DC
  Filled 2016-10-09: qty 1

## 2016-10-09 MED ORDER — TIROFIBAN (AGGRASTAT) BOLUS VIA INFUSION
INTRAVENOUS | Status: DC | PRN
  Administered 2016-10-09: 2295 ug via INTRAVENOUS

## 2016-10-09 MED ORDER — HEPARIN (PORCINE) IN NACL 2-0.9 UNIT/ML-% IJ SOLN
INTRAMUSCULAR | Status: AC | PRN
  Administered 2016-10-09: 1000 mL

## 2016-10-09 MED ORDER — SODIUM CHLORIDE 0.9% FLUSH: 3.0000 mL | INTRAVENOUS | Status: DC | PRN

## 2016-10-09 MED ORDER — HYDRALAZINE HCL 20 MG/ML IJ SOLN: 5.0000 mg | INTRAMUSCULAR | Status: AC | PRN

## 2016-10-09 MED ORDER — METOPROLOL TARTRATE 12.5 MG HALF TABLET
12.5000 mg | ORAL_TABLET | Freq: Two times a day (BID) | ORAL | Status: DC
  Administered 2016-10-09 – 2016-10-10 (×2): 12.5 mg via ORAL
  Filled 2016-10-09 (×2): qty 1

## 2016-10-09 MED ORDER — MIDAZOLAM HCL 2 MG/2ML IJ SOLN
INTRAMUSCULAR | Status: DC | PRN
  Administered 2016-10-09: 1 mg via INTRAVENOUS

## 2016-10-09 MED ORDER — VERAPAMIL HCL 2.5 MG/ML IV SOLN
INTRAVENOUS | Status: AC
  Filled 2016-10-09: qty 2

## 2016-10-09 MED ORDER — ENOXAPARIN SODIUM 40 MG/0.4ML ~~LOC~~ SOLN
40.0000 mg | SUBCUTANEOUS | Status: DC
  Filled 2016-10-09: qty 0.4

## 2016-10-09 MED ORDER — IOPAMIDOL (ISOVUE-370) INJECTION 76%
INTRAVENOUS | Status: DC | PRN
  Administered 2016-10-09: 165 mL via INTRA_ARTERIAL

## 2016-10-09 MED ORDER — LABETALOL HCL 5 MG/ML IV SOLN: 10.0000 mg | INTRAVENOUS | Status: AC | PRN

## 2016-10-09 MED ORDER — LIDOCAINE HCL (PF) 1 % IJ SOLN
INTRAMUSCULAR | Status: DC | PRN
  Administered 2016-10-09: 1 mL via INTRADERMAL

## 2016-10-09 MED ORDER — IOPAMIDOL (ISOVUE-370) INJECTION 76%
INTRAVENOUS | Status: AC
  Filled 2016-10-09: qty 100

## 2016-10-09 MED ORDER — IPRATROPIUM-ALBUTEROL 0.5-2.5 (3) MG/3ML IN SOLN
3.0000 mL | Freq: Once | RESPIRATORY_TRACT | Status: AC
  Administered 2016-10-09: 3 mL via RESPIRATORY_TRACT
  Filled 2016-10-09: qty 3

## 2016-10-09 MED ORDER — SODIUM CHLORIDE 0.9% FLUSH: 3.0000 mL | Freq: Two times a day (BID) | INTRAVENOUS | Status: DC

## 2016-10-09 MED ORDER — HEPARIN (PORCINE) IN NACL 2-0.9 UNIT/ML-% IJ SOLN
INTRAMUSCULAR | Status: AC
  Filled 2016-10-09: qty 1000

## 2016-10-09 MED ORDER — NITROGLYCERIN 0.4 MG SL SUBL
0.4000 mg | SUBLINGUAL_TABLET | SUBLINGUAL | Status: DC | PRN
  Administered 2016-10-09: 0.4 mg via SUBLINGUAL
  Filled 2016-10-09: qty 1

## 2016-10-09 MED ORDER — ONDANSETRON HCL 4 MG/2ML IJ SOLN: 4.0000 mg | Freq: Four times a day (QID) | INTRAMUSCULAR | Status: DC | PRN

## 2016-10-09 MED ORDER — SODIUM CHLORIDE 0.9 % IV SOLN: 250.0000 mL | INTRAVENOUS | Status: DC | PRN

## 2016-10-09 MED ORDER — ASPIRIN EC 81 MG PO TBEC
81.0000 mg | DELAYED_RELEASE_TABLET | Freq: Every day | ORAL | Status: DC
  Administered 2016-10-10: 81 mg via ORAL
  Filled 2016-10-09: qty 1

## 2016-10-09 MED ORDER — VERAPAMIL HCL 2.5 MG/ML IV SOLN
INTRAVENOUS | Status: DC | PRN
  Administered 2016-10-09: 10 mL via INTRA_ARTERIAL

## 2016-10-09 MED ORDER — HEPARIN SODIUM (PORCINE) 1000 UNIT/ML IJ SOLN
INTRAMUSCULAR | Status: AC
  Filled 2016-10-09: qty 1

## 2016-10-09 MED ORDER — HEPARIN (PORCINE) IN NACL 100-0.45 UNIT/ML-% IJ SOLN
1500.0000 [IU]/h | INTRAMUSCULAR | Status: DC
  Administered 2016-10-09: 1200 [IU]/h via INTRAVENOUS
  Filled 2016-10-09 (×2): qty 250

## 2016-10-09 MED ORDER — SODIUM CHLORIDE 0.9 % IV SOLN: INTRAVENOUS | Status: AC

## 2016-10-09 MED ORDER — HEART ATTACK BOUNCING BOOK
Freq: Once | Status: AC
  Administered 2016-10-09: 23:00:00
  Filled 2016-10-09: qty 1

## 2016-10-09 MED ORDER — MIDAZOLAM HCL 2 MG/2ML IJ SOLN
INTRAMUSCULAR | Status: AC
Start: 2016-10-09 — End: 2016-10-09
  Filled 2016-10-09: qty 2

## 2016-10-09 MED ORDER — LIDOCAINE HCL 2 % IJ SOLN
INTRAMUSCULAR | Status: AC
  Filled 2016-10-09: qty 10

## 2016-10-09 MED ORDER — HEPARIN SODIUM (PORCINE) 1000 UNIT/ML IJ SOLN
INTRAMUSCULAR | Status: DC | PRN
  Administered 2016-10-09: 2000 [IU] via INTRAVENOUS
  Administered 2016-10-09: 4500 [IU] via INTRAVENOUS
  Administered 2016-10-09: 4000 [IU] via INTRAVENOUS

## 2016-10-09 MED ORDER — ASPIRIN 81 MG PO CHEW
81.0000 mg | CHEWABLE_TABLET | ORAL | Status: AC
  Administered 2016-10-09: 81 mg via ORAL
  Filled 2016-10-09: qty 1

## 2016-10-09 SURGICAL SUPPLY — 21 items
BALLN EMERGE MR 2.5X8 (BALLOONS) ×2
BALLN ~~LOC~~ EUPHORA RX 4.0X12 (BALLOONS) ×2
BALLOON EMERGE MR 2.5X8 (BALLOONS) ×1 IMPLANT
BALLOON ~~LOC~~ EUPHORA RX 4.0X12 (BALLOONS) ×1 IMPLANT
CATH 5FR JL3.5 JR4 ANG PIG MP (CATHETERS) ×2 IMPLANT
CATH OPTICROSS 40MHZ (CATHETERS) ×2 IMPLANT
CATH VISTA GUIDE 6FR XBLAD3.5 (CATHETERS) ×2 IMPLANT
DEVICE RAD COMP TR BAND LRG (VASCULAR PRODUCTS) ×2 IMPLANT
GLIDESHEATH SLEND SS 6F .021 (SHEATH) ×2 IMPLANT
GUIDEWIRE INQWIRE 1.5J.035X260 (WIRE) ×1 IMPLANT
INQWIRE 1.5J .035X260CM (WIRE) ×2
KIT ENCORE 26 ADVANTAGE (KITS) ×2 IMPLANT
KIT HEART LEFT (KITS) ×2 IMPLANT
PACK CARDIAC CATHETERIZATION (CUSTOM PROCEDURE TRAY) ×2 IMPLANT
SLED PULL BACK IVUS (MISCELLANEOUS) ×2 IMPLANT
STENT RESOLUTE ONYX 3.5X22 (Permanent Stent) ×2 IMPLANT
SYR MEDRAD MARK V 150ML (SYRINGE) ×2 IMPLANT
TRANSDUCER W/STOPCOCK (MISCELLANEOUS) ×2 IMPLANT
TUBING CIL FLEX 10 FLL-RA (TUBING) ×2 IMPLANT
WIRE HI TORQ BMW 190CM (WIRE) ×2 IMPLANT
WIRE RUNTHROUGH .014X180CM (WIRE) ×2 IMPLANT

## 2016-10-09 NOTE — ED Triage Notes (Signed)
Pt BIB [facility] for chest pain that started a day ago. Pt states it is left sided with radiation to left arm and left jaw. PT took ASA and tylenol prior to EMS arrival but does not know the strength of ASA. No nitro administered due to patient stating it interacts with one of his medications.

## 2016-10-09 NOTE — ED Provider Notes (Signed)
Sign out from Janetta Hora, PA-C at shift change  Chest pain that started a day ago, radiation to left arm and left jaw, resolved with troponin. No history of ACS.  Initial I-stat troponin 0.05, delta went to 0.58. Cards was consulted, but requested regular troponin and a call back.  HEART score is 4. Pending troponin result, call cardiology back. Heparin is ordered.   Troponin I 0.67. I spoke with Angie with cardiology who will admit the patient for further evaluation and treatment.  Dr. Irish Lack evaluated patient and will take to cath lab today and admit.   Frederica Kuster, PA-C 10/09/16 1023    Merryl Hacker, MD 10/09/16 2306

## 2016-10-09 NOTE — Progress Notes (Signed)
[place] for Heparin  Indication: chest pain/ACS  No Known Allergies  Vital Signs: BP: 111/76 (10/01 1300) Pulse Rate: 66 (10/01 1300)  Labs:  Recent Labs  10/09/16 0045 10/09/16 0513 10/09/16 1356  HGB 16.4  --   --   HCT 47.8  --   --   PLT 146*  --   --   LABPROT  --   --  13.0  INR  --   --  0.99  HEPARINUNFRC  --   --  0.14*  CREATININE 1.08  --   --   TROPONINI  --  0.67*  --     Estimated Creatinine Clearance: 75 mL/min (by C-G formula based on SCr of 1.08 mg/dL).  Assessment: 53 yom presented to the ED with CP. Start on IV heparin. Initial heparin level is low at 0.14. No bleeding noted. Planning possible cardiac cath.   Goal of Therapy:  Heparin level 0.3-0.7 units/ml Monitor platelets by anticoagulation protocol: Yes   Plan:  Increase heparin gtt to 1500 units/hr Check an 8 hr heparin level Daily heparin level and CBC  Mathis Cashman, Rande Lawman 10/09/2016,3:13 PM

## 2016-10-09 NOTE — H&P (Signed)
Admit date: 10/09/2016 Referring Physician [facility] ER Primary Cardiologist new Chief complaint/reason for admission:chest pain  HPI: 66 year old man with a history of tobacco abuse.  He has had intermittent chest discomfort over the past 24 hours. Last night, he had an episode of what he described as a pressure, that lasted anywhere from 30-45 minutes. He thought it was gas. He had more discomfort a few hours later. It moved to his left arm and left side of his face. It persisted even while he was at rest. He came to the ER via EMS.  Initial Medicare troponin was positive. A lab troponin was also positive. He had some relief of his chest discomfort with nitroglycerin. Currently, he is pain-free.  He is concerned that he has dogs at home but no one is caring for. He states that he is alone. He has a sister in [place] that he wants to talk to.  He is followed by Dr. Heber Allport. He has been considering various tools to try to quit smoking.    PMH:    Past Medical History:  Diagnosis Date  . Seasonal allergies     PSH:   History reviewed. No pertinent surgical history.  ALLERGIES:   Patient has no known allergies.  Prior to Admit Meds:   (Not in a hospital admission) Family HX:    Family History  Problem Relation Age of Onset  . Cancer Father    Social HX:    Social History   Social History  . Marital status: Divorced    Spouse name: N/A  . Number of children: N/A  . Years of education: N/A   Occupational History  . Not on file.   Social History Main Topics  . Smoking status: Current Every Day Smoker    Packs/day: 2.00    Years: 45.00    Types: Cigarettes    Start date: 01/09/1961  . Smokeless tobacco: Never Used     Comment: thinking about after settling in   . Alcohol use No  . Drug use: No  . Sexual activity: Not on file   Other Topics Concern  . Not on file   Social History Narrative  . No narrative on file     ROS:  All 11 ROS were addressed and are  negative except what is stated in the HPI  PHYSICAL EXAM Vitals:   10/09/16 0600 10/09/16 0630  BP: 98/62 (!) 95/55  Pulse: 78 63  Resp: 17 18  Temp:    SpO2: 93% (!) 89%   General: Well developed, well nourished, in no acute distress Head: Eyes PERRLA, No xanthomas.   Normal cephalic and atramatic  Lungs:   Clear bilaterally to auscultation and percussion. Heart:   HRRR S1 S2 Pulses are 2+ & equal.            No JVD.   Abdomen: Nontender, nondistended Msk:  Back normal, normal gait. Normal strength and tone for age. Extremities:   No clubbing, cyanosis or edema.  2+ right radial pulse Neuro: Alert and oriented X 3. Psych:  Good affect, responds appropriately Skin: No rash   Labs:   Lab Results  Component Value Date   WBC 9.9 10/09/2016   HGB 16.4 10/09/2016   HCT 47.8 10/09/2016   MCV 83.9 10/09/2016   PLT 146 (L) 10/09/2016    Recent Labs Lab 10/09/16 0045  NA 137  K 3.6  CL 104  CO2 28  BUN 15  CREATININE  1.08  CALCIUM 8.6*  GLUCOSE 117*   Lab Results  Component Value Date   TROPONINI 0.67 ([place]) 10/09/2016   No results found for: PTT No results found for: INR, PROTIME  No results found for: CHOL No results found for: HDL No results found for: LDLCALC No results found for: TRIG No results found for: CHOLHDL No results found for: LDLDIRECT    Radiology:  Dg Chest 2 View  Result Date: 10/09/2016 CLINICAL DATA:  Chest pain beginning yesterday with radiation to left arm and jaw. EXAM: CHEST  2 VIEW COMPARISON:  03/15/2016 FINDINGS: Lungs are adequately inflated without focal airspace consolidation or effusion. Cardiomediastinal silhouette is within normal. There is minimal calcified plaque over the thoracic aorta. There are mild degenerative changes of the spine. IMPRESSION: No acute cardiopulmonary disease. Aortic Atherosclerosis (ICD10-I70.0). Electronically Signed   By: Marin Olp M.D.   On: 10/09/2016 01:10    EKG:  Normal sinus rhythm, left  anterior fascicular block, nonspecific ST segment changes  ASSESSMENT: /PLAN:    Acute coronary syndrome: I think he needs a cardiac cath. I discussed the risks and benefits cardiac cath with the patient. He is willing to have the procedure but he would prefer that it be scheduled at a later time so he can go home and take care of his dogs. I explained him that this is an urgent situation given his symptomatology. He is going to try to talk to his sister in [place]. He needs to do this before agreeing to the cath. I would not let the patient go home without having definitive cardiac evaluation. I explained to him that this could be life-threatening and there was a good chance he had a severe blockage based on his symptoms and his lab values.  Tobacco abuse: Needs to stop smoking. This is something that will help his overall health.  Cardiac catheterization was discussed with the patient fully. The patient understands that risks include but are not limited to stroke (1 in 1000), death (1 in 43), kidney failure [usually temporary] (1 in 500), bleeding (1 in 200), allergic reaction [possibly serious] (1 in 200).  The patient understands and is willing to proceed after communicating with his family.  All questions were answered.   Larae Grooms, MD  10/09/2016  8:14 AM

## 2016-10-09 NOTE — ED Provider Notes (Signed)
[facility] DEPT Provider Note   CSN: [id number] Arrival date & time:        History   Chief Complaint Chief Complaint  Patient presents with  . Chest Pain    HPI David Hendricks is a 66 y.o. male who presents with chest pain. PMH significant for tobacco use (2 packs a day), allergies, chronic shoulder pain, constipation. He states that last night he had an episode of chest pain that lasted for 30-45 minutes. He used a suppository for his constipation and vomited and felt better and his chest pain resolved. Tonight he had an acute onset of chest pain while he was getting ready for bed. It started about an hour ago. It is left sided, constant, feels like a pressure. It is non-exertional. It radiates to his left arm and jaw. He reports associated nausea. He took an ASA and Tylenol. EMS was called who placed him on O2 and transported. No fever, chills, SOB, cough, abdominal pain, vomiting. He denies cardiac history. Pain has reduced from 8/10 to 2/10.   HPI  Past Medical History:  Diagnosis Date  . Seasonal allergies     Patient Active Problem List   Diagnosis Date Noted  . Tobacco abuse 08/28/2016  . Difficulty sleeping 04/22/2016  . Periodic health assessment, general screening, adult 04/22/2016  . Urinary dysfunction 04/22/2016  . Atopic dermatitis 03/23/2016  . Urinary frequency 02/11/2016  . Right shoulder pain 02/11/2016    History reviewed. No pertinent surgical history.     Home Medications    Prior to Admission medications   Medication Sig Start Date End Date Taking? Authorizing Provider  buPROPion (WELLBUTRIN SR) 150 MG 12 hr tablet Take 1 tablet (150 mg total) by mouth 2 (two) times daily. For the first 3 days take one tablet afterwards take 2 tablets daily 08/24/16 08/24/17  Kalman Shan Ratliff, DO  cetirizine (ZYRTEC ALLERGY) 10 MG tablet Take 1 tablet (10 mg total) by mouth daily. 03/24/16   Norman Herrlich, MD  cyclobenzaprine (FLEXERIL) 5 MG tablet Take 1  tablet (5 mg total) by mouth at bedtime. 04/20/16   Valinda Party, DO  hydrOXYzine (ATARAX/VISTARIL) 10 MG tablet Take 1 tablet (10 mg total) by mouth 3 (three) times daily as needed. 03/24/16   Norman Herrlich, MD  nicotine (NICODERM CQ - DOSED IN MG/24 HOURS) 21 mg/24hr patch Place 1 patch (21 mg total) onto the skin daily. 09/05/16 09/05/17  Valinda Party, DO  Skin Protectants, Misc. (EUCERIN) cream Apply topically as needed for dry skin. 03/24/16   Norman Herrlich, MD  tamsulosin (FLOMAX) 0.4 MG CAPS capsule Take 2 capsules (0.8 mg total) by mouth daily. 02/11/16   Ledell Noss, MD  traMADol (ULTRAM) 50 MG tablet Take 1 tablet (50 mg total) by mouth every 6 (six) hours as needed for severe pain. 08/08/16   Kalman Shan Ratliff, DO  triamcinolone cream (KENALOG) 0.1 % Apply 1 application topically 2 (two) times daily. 04/20/16   Valinda Party, DO    Family History Family History  Problem Relation Age of Onset  . Cancer Father     Social History Social History  Substance Use Topics  . Smoking status: Current Every Day Smoker    Packs/day: 2.00    Years: 45.00    Types: Cigarettes    Start date: 01/09/1961  . Smokeless tobacco: Never Used     Comment: thinking about after settling in   . Alcohol use No  Allergies   Patient has no known allergies.   Review of Systems Review of Systems  Constitutional: Negative for chills and fever.  Respiratory: Negative for cough and shortness of breath.   Cardiovascular: Positive for chest pain.  Gastrointestinal: Negative for abdominal pain, nausea and vomiting.  Musculoskeletal: Positive for back pain (chronic).  Neurological: Positive for light-headedness. Negative for syncope.     Physical Exam Updated Vital Signs BP 123/67 (BP Location: Right Arm)   Pulse 78   Temp 98.3 F (36.8 C) (Oral)   Resp 18   SpO2 90%   Physical Exam  Constitutional: He is oriented to person, place, and time. He appears  well-developed and well-nourished. No distress.  On O2 via [place]  HENT:  Head: Normocephalic and atraumatic.  Eyes: Pupils are equal, round, and reactive to light. Conjunctivae are normal. Right eye exhibits no discharge. Left eye exhibits no discharge. No scleral icterus.  Neck: Normal range of motion.  Cardiovascular: Normal rate and regular rhythm.  Exam reveals no gallop and no friction rub.   No murmur heard. Pulmonary/Chest: Effort normal. No respiratory distress. He has wheezes (Scattered wheezes and rhonchi). He has no rales. He exhibits no tenderness.  Abdominal: Soft. Bowel sounds are normal. He exhibits no distension. There is no tenderness.  Musculoskeletal:  No lower leg edema  Neurological: He is alert and oriented to person, place, and time.  Skin: Skin is warm and dry.  Psychiatric: He has a normal mood and affect. His behavior is normal.  Nursing note and vitals reviewed.    ED Treatments / Results  Labs (all labs ordered are listed, but only abnormal results are displayed) Labs Reviewed  BASIC METABOLIC PANEL - Abnormal; Notable for the following:       Result Value   Glucose, Bld 117 (*)    Calcium 8.6 (*)    All other components within normal limits  CBC - Abnormal; Notable for the following:    Platelets 146 (*)    All other components within normal limits  I-STAT TROPONIN, ED - Abnormal; Notable for the following:    Troponin i, poc 0.58 (*)    All other components within normal limits  D-DIMER, QUANTITATIVE (NOT AT [facility])  I-STAT TROPONIN, ED    EKG  EKG Interpretation  Date/Time:  Monday October 09 2016 00:32:26 EDT Ventricular Rate:  77 PR Interval:  174 QRS Duration: 120 QT Interval:  386 QTC Calculation: 436 R Axis:   -64 Text Interpretation:  Normal sinus rhythm Left anterior fascicular block Abnormal ECG No significant change since last tracing Confirmed by Thayer Jew [phone]) on 10/09/2016 12:41:43 AM       Radiology Dg Chest 2  View  Result Date: 10/09/2016 CLINICAL DATA:  Chest pain beginning yesterday with radiation to left arm and jaw. EXAM: CHEST  2 VIEW COMPARISON:  03/15/2016 FINDINGS: Lungs are adequately inflated without focal airspace consolidation or effusion. Cardiomediastinal silhouette is within normal. There is minimal calcified plaque over the thoracic aorta. There are mild degenerative changes of the spine. IMPRESSION: No acute cardiopulmonary disease. Aortic Atherosclerosis (ICD10-I70.0). Electronically Signed   By: Marin Olp M.D.   On: 10/09/2016 01:10    Procedures Procedures (including critical care time)  CRITICAL CARE Performed by: Recardo Evangelist   Total critical care time: 40 minutes  Critical care time was exclusive of separately billable procedures and treating other patients.  Critical care was necessary to treat or prevent imminent or life-threatening deterioration.  Critical  care was time spent personally by me on the following activities: development of treatment plan with patient and/or surrogate as well as nursing, discussions with consultants, evaluation of patient's response to treatment, examination of patient, obtaining history from patient or surrogate, ordering and performing treatments and interventions, ordering and review of laboratory studies, ordering and review of radiographic studies, pulse oximetry and re-evaluation of patient's condition.   Medications Ordered in ED Medications  nitroGLYCERIN (NITROSTAT) SL tablet 0.4 mg (0.4 mg Sublingual Given 10/09/16 0249)  ipratropium-albuterol (DUONEB) 0.5-2.5 (3) MG/3ML nebulizer solution 3 mL (3 mLs Nebulization Given 10/09/16 0156)     Initial Impression / Assessment and Plan / ED Course  I have reviewed the triage vital signs and the nursing notes.  Pertinent labs & imaging results that were available during my care of the patient were reviewed by me and considered in my medical decision making (see chart for  details).  66 year old male presents with chest pain. Pain is resolving on my exam. He has scattered wheezes and rhonchi - likely due to tobacco use. Duoneb was ordered. Vitals are normal. Labs are overall normal. EKG is NSR with left anterior fascicular block. CXR is negative but does show atherosclerosis. Initial trop is 0.05.   2:30 AM Shared visit with Dr. Dina Rich. Will delta trop and add D-dimer. HEART score is 4. Nitro was given and pain has totally resolved.   4:08 AM 2nd Trop is 0.58. Heparin ordered. Consult to cardiology placed.  5:08 AM Spoke to Dr. Radford Pax - she requests non-POC troponin.  6:02 AM Trop is still pending at shift change. Care signed out to A. Law who will call Cardiology back once it results.    Final Clinical Impressions(s) / ED Diagnoses   Final diagnoses:  NSTEMI (non-ST elevated myocardial infarction) [facility])    New Prescriptions New Prescriptions   No medications on file     Recardo Evangelist, Hershal Coria 10/09/16 [id number]    Merryl Hacker, MD 10/09/16 2306

## 2016-10-09 NOTE — Interval H&P Note (Signed)
History and Physical Interval Note:  10/09/2016 5:52 PM  David Hendricks  has presented today for cardiac catheterization, with the diagnosis of NSTEMI. The various methods of treatment have been discussed with the patient and family. After consideration of risks, benefits and other options for treatment, the patient has consented to  Procedure(s): LEFT HEART CATH AND CORONARY ANGIOGRAPHY (N/A) as a surgical intervention .  The patient's history has been reviewed, patient examined, no change in status, stable for surgery.  I have reviewed the patient's chart and labs.  Questions were answered to the patient's satisfaction.    Cath Lab Visit (complete for each Cath Lab visit)  Clinical Evaluation Leading to the Procedure:   ACS: Yes.    Non-ACS:  N/A  Mariska Daffin

## 2016-10-09 NOTE — Progress Notes (Signed)
ANTICOAGULATION CONSULT NOTE - Initial Consult  Pharmacy Consult for Heparin  Indication: chest pain/ACS  No Known Allergies  Vital Signs: Temp: 98.3 F (36.8 C) (10/01 0031) Temp Source: Oral (10/01 0031) BP: 110/65 (10/01 0247) Pulse Rate: 70 (10/01 0247)  Labs:  Recent Labs  10/09/16 0045  HGB 16.4  HCT 47.8  PLT 146*  CREATININE 1.08    CrCl cannot be calculated (Unknown ideal weight.).   Medical History: Past Medical History:  Diagnosis Date  . Seasonal allergies    Assessment: Heparin for CP/elevated troponin, Hgb 16.4, Plts 146, renal function good, PTA meds reviewed.   Goal of Therapy:  Heparin level 0.3-0.7 units/ml Monitor platelets by anticoagulation protocol: Yes   Plan:  Heparin 4000 units BOLUS Start heparin drip at 1200 units/hr 1200 HL Daily CBC/HL Monitor for bleeding   Narda Bonds 10/09/2016,4:37 AM

## 2016-10-09 NOTE — Progress Notes (Signed)
Per dr end verbal order give brilinta 90mg  now (instead of 2200 dose) then again at 1000 on 10/10/16.  Per Grace Bushy, RN in cath lab verbally stated in report pt given 180mg  Brilinta; unable to locate this on [facility]; Dr. Saunders Revel paged.

## 2016-10-10 ENCOUNTER — Encounter (HOSPITAL_COMMUNITY): Payer: Self-pay | Admitting: Internal Medicine

## 2016-10-10 ENCOUNTER — Telehealth: Payer: Self-pay | Admitting: Interventional Cardiology

## 2016-10-10 ENCOUNTER — Other Ambulatory Visit (HOSPITAL_COMMUNITY): Payer: Medicare Other

## 2016-10-10 DIAGNOSIS — K5900 Constipation, unspecified: Secondary | ICD-10-CM | POA: Diagnosis not present

## 2016-10-10 DIAGNOSIS — E785 Hyperlipidemia, unspecified: Secondary | ICD-10-CM | POA: Diagnosis not present

## 2016-10-10 DIAGNOSIS — I214 Non-ST elevation (NSTEMI) myocardial infarction: Principal | ICD-10-CM

## 2016-10-10 DIAGNOSIS — E782 Mixed hyperlipidemia: Secondary | ICD-10-CM

## 2016-10-10 DIAGNOSIS — Z79899 Other long term (current) drug therapy: Secondary | ICD-10-CM | POA: Diagnosis not present

## 2016-10-10 DIAGNOSIS — Z720 Tobacco use: Secondary | ICD-10-CM | POA: Diagnosis not present

## 2016-10-10 DIAGNOSIS — F17210 Nicotine dependence, cigarettes, uncomplicated: Secondary | ICD-10-CM | POA: Diagnosis not present

## 2016-10-10 DIAGNOSIS — R079 Chest pain, unspecified: Secondary | ICD-10-CM | POA: Diagnosis not present

## 2016-10-10 DIAGNOSIS — I2510 Atherosclerotic heart disease of native coronary artery without angina pectoris: Secondary | ICD-10-CM | POA: Diagnosis not present

## 2016-10-10 LAB — CBC
HCT: 48.6 % (ref 39.0–52.0)
Hemoglobin: 16.1 g/dL (ref 13.0–17.0)
MCH: 28 pg (ref 26.0–34.0)
MCHC: 33.1 g/dL (ref 30.0–36.0)
MCV: 84.5 fL (ref 78.0–100.0)
PLATELETS: 152 10*3/uL (ref 150–400)
RBC: 5.75 MIL/uL (ref 4.22–5.81)
RDW: 13.8 % (ref 11.5–15.5)
WBC: 9.7 10*3/uL (ref 4.0–10.5)

## 2016-10-10 LAB — BASIC METABOLIC PANEL
Anion gap: 5 (ref 5–15)
BUN: 15 mg/dL (ref 6–20)
CALCIUM: 8.7 mg/dL — AB (ref 8.9–10.3)
CO2: 30 mmol/L (ref 22–32)
Chloride: 102 mmol/L (ref 101–111)
Creatinine, Ser: 1.08 mg/dL (ref 0.61–1.24)
GFR calc Af Amer: >60 mL/min (ref >60)
GLUCOSE: 93 mg/dL (ref 65–99)
POTASSIUM: 4.1 mmol/L (ref 3.5–5.1)
SODIUM: 137 mmol/L (ref 135–145)

## 2016-10-10 MED ORDER — NITROGLYCERIN 0.4 MG SL SUBL: 0.4000 mg | SUBLINGUAL_TABLET | SUBLINGUAL | 2 refills | Status: DC | PRN

## 2016-10-10 MED ORDER — METOPROLOL TARTRATE 25 MG PO TABS: 12.5000 mg | ORAL_TABLET | Freq: Two times a day (BID) | ORAL | 1 refills | Status: DC

## 2016-10-10 MED ORDER — ATORVASTATIN CALCIUM 80 MG PO TABS: 80.0000 mg | ORAL_TABLET | Freq: Every day | ORAL | 1 refills | Status: DC

## 2016-10-10 MED ORDER — NICOTINE 21 MG/24HR TD PT24: 21.0000 mg | MEDICATED_PATCH | TRANSDERMAL | 0 refills | Status: DC

## 2016-10-10 MED ORDER — TICAGRELOR 90 MG PO TABS: 90.0000 mg | ORAL_TABLET | Freq: Two times a day (BID) | ORAL | 2 refills | Status: DC

## 2016-10-10 MED ORDER — ASPIRIN 81 MG PO TBEC: 81.0000 mg | DELAYED_RELEASE_TABLET | Freq: Every day | ORAL | Status: DC

## 2016-10-10 MED FILL — Lidocaine HCl Local Inj 2%: INTRAMUSCULAR | Qty: 10 | Status: AC

## 2016-10-10 MED FILL — Ticagrelor Tab 90 MG: ORAL | Qty: 1 | Status: AC

## 2016-10-10 NOTE — Telephone Encounter (Signed)
Pt was released from hospital today. Will call later this week.

## 2016-10-10 NOTE — Progress Notes (Signed)
CARDIAC REHAB PHASE I   PRE:  Rate/Rhythm: 74 SR  BP:  Supine:   Sitting: 105/56  Standing:    SaO2: 95%RA  MODE:  Ambulation: 480 ft   POST:  Rate/Rhythm: 90 SR  BP:  Supine:   Sitting: 125/60  Standing:    SaO2: 94%RA 0805-0902 Pt walked 480 ft with steady gait. No c/o CP. MI education completed with pt who voiced understanding. Stressed importance of brilinta with stent. Needs to see case manager. Reviewed NTG use, risk factors, ex ed, heart healthy diet and smoking cessation. Gave pt smoking cessation handout and fake cigarette. Encouraged him to call 1800quitnow as needed. Pt stated he gets very nervous and has always gone back to smoking when he has quit before. Pt lives alone and stated he would like for someone to come to his house and look over his meds and make sure he is taking the right meds. Discussed with him that his meds will be written down and he needs to get a pill box so he can do his meds weekly. Stressed not missing brilinta . Pt may benefit from [facility]. Discussed CRP 2 and highly recommend for pt as he could benefit from the ex but also having the staff there to work with him. Will refer to [facility]. He seems very interested in attending.   Graylon Good, RN BSN  10/10/2016 8:58 AM

## 2016-10-10 NOTE — Telephone Encounter (Signed)
New message    TOC appt made per Ria Comment NP at [facility] with Truitt Merle on 10/18/16 at 11:30am. Dr. Irish Lack team did not have an opening in 7-10 days.

## 2016-10-10 NOTE — Progress Notes (Signed)
CSW consulted by RN CM for transportation home. CSW spoke with pt and confirmed that pt is able to pay for a cab, however the money is at home. CSW reached out to [facility] to explain pt's situation and they are understanding and agreeable to transport pt home as long as pt pays once at home. CSW updated pt and RN of this. At this time there are no further CSW needs. CSW signing off.    Virgie Dad Carter Kaman, MSW, [place] [facility] Clinical Social Worker [phone]

## 2016-10-10 NOTE — Care Management Note (Signed)
Case Management Note  Patient Details  Name: David Hendricks MRN: [id number] Date of Birth: 1950/02/10  Subjective/Objective:   From home alone, presented with chest pain thru ED, s/p coronary stent intervention will be on brilinta, He has Medicaid ,co pay is 3.50 for medications. Patient chose [facility] for [facility] for medication management.  Referral made to [facility], Soc will begin 24-48 hrs post dc. NCM gave patient the 30 day coupon card.                   Action/Plan: Will dc with HHRN for medication mangement and has 30 day free brilinta coupon card.  Expected Discharge Date:  10/10/16               Expected Discharge Plan:  [place]  In-House Referral:     Discharge planning Services  CM Consult  Post Acute Care Choice:  Home Health Choice offered to:  Patient  DME Arranged:    DME Agency:     HH Arranged:  RN [place] Agency:  [place]  Status of Service:  Completed, signed off  If discussed at [place] of Stay Meetings, dates discussed:    Additional Comments:  Zenon Mayo, RN 10/10/2016, 10:04 AM

## 2016-10-10 NOTE — Discharge Summary (Signed)
Discharge Summary    Patient ID: David Hendricks,  MRN: [id number], DOB/AGE: October 25, 1950 66 y.o.  Admit date: 10/09/2016 Discharge date: 10/10/2016  Primary Care Provider: Valinda Party Primary Cardiologist: Irish Lack  Discharge Diagnoses    Active Problems:   NSTEMI (non-ST elevated myocardial infarction) [facility])   Tobacco abuse   Hyperlipidemia   Allergies No Known Allergies  Diagnostic Studies/Procedures    Cath: 10/09/16  Conclusion   Conclusions: 1. Severe single-vessel coronary artery disease with serial 50% ostial and 90% proximal/mid LAD stenoses. 2. Mild, nonobstructive CAD involving the LCx and RCA. 3. Subtle mid and apical anterior hypokinesis with otherwise preserved left ventricular contraction; LVEF low normal at 50-55%. 4. Upper normal left ventricular filling pressure. 5. Successful IVIS guided PCI to the proximal/mid LAD with placement of a Resolute Onyx 3.5 x 22 mm drug-eluting stent, post dilated proximally with a 4.0 mm Singac balloon. The first diagonal branch was jailed by the stent but demonstrates only mild ostial stenosis post PCI with TIMI-3 flow.  Recommendations: 1. Dual antiplatelet therapy with aspirin and ticagrelor for at least 12 months, ideally longer. 2. Aggressive secondary prevention, including high intensity statin therapy and smoking cessation.  David Bush, MD   _____________   History of Present Illness     David Hendricks is a 66 year old man with a history of tobacco abuse.  He has had intermittent chest discomfort over the past 24 hours prior to admission. The night prior to admission, he had an episode of what he described as a pressure, that lasted anywhere from 30-45 minutes. He thought it was gas. He had more discomfort a few hours later. It moved to his left arm and left side of his face. It persisted even while he was at rest. He came to the ER via EMS.  Initial POC troponin was positive. A lab troponin was also  positive. He had some relief of his chest discomfort with nitroglycerin. Was pain free at the time of assessment.  He is followed by Dr. Heber [place]. He has been considering various tools to try to quit smoking. EKG showed NSR with LAFB. Given his symptoms and risk factors he was admitted for further work up.   Hospital Course   He was placed on IV heparin and underwent cardiac cath noted above with successful IVIS guided PCI/DES to the pLAD. Also noted mild nonobstructive CAD to the RCA and Lcx. Plan for DAPT with ASA/Brilinta for at least one year. LDL above goal at 125, therefore started on high dose statin. Also added low dose metoprolol with HR and BP tolerating. Post cath labs showed stable Cr 1.08 and Hgb 16.1. He was counseled on the need for smoking cessation. Hgb A1c 5.1. Worked with cardiac rehab without any further chest pain. Trop peaked at 0.67. CM assisted to help with [name] regarding follow up with medication assistance.   General: Well developed, well nourished, male appearing in no acute distress. Head: Normocephalic, atraumatic.  Neck: Supple without bruits, JVD. Lungs:  Resp regular and unlabored, CTA. Heart: RRR, S1, S2, no S3, S4, or murmur; no rub. Abdomen: Soft, non-tender, non-distended with normoactive bowel sounds. No hepatomegaly. No rebound/guarding. No obvious abdominal masses. Extremities: No clubbing, cyanosis, edema. Distal pedal pulses are 2+ bilaterally. R radial cath site stable without bruising or hematoma Neuro: Alert and oriented X 3. Moves all extremities spontaneously. Psych: Normal affect.  David Hendricks was seen by Dr. Irish Lack and determined stable for discharge home. Follow up in the  office has been arranged. Medications are listed below.   _____________  Discharge Vitals Blood pressure (!) 107/58, pulse 71, temperature 97.6 F (36.4 C), temperature source Oral, resp. rate 20, height 6\' 1"  (1.854 m), weight 202 lb 6.1 oz (91.8 kg), SpO2 94 %.  Filed  Weights   10/09/16 1500 10/09/16 2011 10/10/16 0611  Weight: 202 lb 6.1 oz (91.8 kg) 202 lb 6.1 oz (91.8 kg) 202 lb 6.1 oz (91.8 kg)    Labs & Radiologic Studies    CBC  Recent Labs  10/09/16 0045 10/10/16 0245  WBC 9.9 9.7  HGB 16.4 16.1  HCT 47.8 48.6  MCV 83.9 84.5  PLT 146* 119   Basic Metabolic Panel  Recent Labs  10/09/16 0045 10/10/16 0245  NA 137 137  K 3.6 4.1  CL 104 102  CO2 28 30  GLUCOSE 117* 93  BUN 15 15  CREATININE 1.08 1.08  CALCIUM 8.6* 8.7*   Liver Function Tests No results for input(s): AST, ALT, ALKPHOS, BILITOT, PROT, ALBUMIN in the last 72 hours. No results for input(s): LIPASE, AMYLASE in the last 72 hours. Cardiac Enzymes  Recent Labs  10/09/16 0513 10/09/16 2011  TROPONINI 0.67* 0.23*   BNP Invalid input(s): POCBNP D-Dimer  Recent Labs  10/09/16 0330  DDIMER 0.47   Hemoglobin A1C  Recent Labs  10/09/16 2009  HGBA1C 5.1   Fasting Lipid Panel  Recent Labs  10/09/16 2009  CHOL 181  HDL 36*  LDLCALC 125*  TRIG 99  CHOLHDL 5.0   Thyroid Function Tests No results for input(s): TSH, T4TOTAL, T3FREE, THYROIDAB in the last 72 hours.  Invalid input(s): FREET3 _____________  Dg Chest 2 View  Result Date: 10/09/2016 CLINICAL DATA:  Chest pain beginning yesterday with radiation to left arm and jaw. EXAM: CHEST  2 VIEW COMPARISON:  03/15/2016 FINDINGS: Lungs are adequately inflated without focal airspace consolidation or effusion. Cardiomediastinal silhouette is within normal. There is minimal calcified plaque over the thoracic aorta. There are mild degenerative changes of the spine. IMPRESSION: No acute cardiopulmonary disease. Aortic Atherosclerosis (ICD10-I70.0). Electronically Signed   By: Marin Olp M.D.   On: 10/09/2016 01:10   Disposition   Pt is being discharged home today in good condition.  Follow-up Plans & Appointments    Follow-up Information    Burtis Junes, NP Follow up on 10/18/2016.     Specialties:  Nurse Practitioner, Interventional Cardiology, Cardiology, Radiology Why:  at 11:30am for your follow up appt.  Contact information: [place]. [address] [place] [address] [phone]        [facility] Follow up.   Why:  HHRN for medication management Contact information: [address] [place] [address] [phone]          Discharge Instructions    Amb Referral to Cardiac Rehabilitation    Complete by:  As directed    Diagnosis:   NSTEMI Coronary Stents     Call MD for:  redness, tenderness, or signs of infection (pain, swelling, redness, odor or green/yellow discharge around incision site)    Complete by:  As directed    Diet - low sodium heart healthy    Complete by:  As directed    Discharge instructions    Complete by:  As directed    Radial Site Care Refer to this sheet in the next few weeks. These instructions provide you with information on caring for yourself after your procedure. Your caregiver may  also give you more specific instructions. Your treatment has been planned according to current medical practices, but problems sometimes occur. Call your caregiver if you have any problems or questions after your procedure. HOME CARE INSTRUCTIONS You may shower the day after the procedure.Remove the bandage (dressing) and gently wash the site with plain soap and water.Gently pat the site dry.  Do not apply powder or lotion to the site.  Do not submerge the affected site in water for 3 to 5 days.  Inspect the site at least twice daily.  Do not flex or bend the affected arm for 24 hours.  No lifting over 5 pounds (2.3 kg) for 5 days after your procedure.  Do not drive home if you are discharged the same day of the procedure. Have someone else drive you.  You may drive 24 hours after the procedure unless otherwise instructed by your caregiver.  What to expect: Any bruising will usually fade within 1 to 2  weeks.  Blood that collects in the tissue (hematoma) may be painful to the touch. It should usually decrease in size and tenderness within 1 to 2 weeks.  SEEK IMMEDIATE MEDICAL CARE IF: You have unusual pain at the radial site.  You have redness, warmth, swelling, or pain at the radial site.  You have drainage (other than a small amount of blood on the dressing).  You have chills.  You have a fever or persistent symptoms for more than 72 hours.  You have a fever and your symptoms suddenly get worse.  Your arm becomes pale, cool, tingly, or numb.  You have heavy bleeding from the site. Hold pressure on the site.   PLEASE DO NOT MISS ANY DOSES OF YOUR BRILINTA!!!!! Also keep a log of you blood pressures and bring back to your follow up appt. Please call the office with any questions.   Patients taking blood thinners should generally stay away from medicines like ibuprofen, Advil, Motrin, naproxen, and Aleve due to risk of stomach bleeding. You may take Tylenol as directed or talk to your primary doctor about alternatives.   Increase activity slowly    Complete by:  As directed       Discharge Medications     Medication List    TAKE these medications   aspirin 81 MG EC tablet Take 1 tablet (81 mg total) by mouth daily.   atorvastatin 80 MG tablet Commonly known as:  LIPITOR Take 1 tablet (80 mg total) by mouth daily at 6 PM.   buPROPion 150 MG 12 hr tablet Commonly known as:  WELLBUTRIN SR Take 1 tablet (150 mg total) by mouth 2 (two) times daily. For the first 3 days take one tablet afterwards take 2 tablets daily What changed:  when to take this  additional instructions   eucerin cream Apply topically as needed for dry skin. What changed:  how much to take  when to take this   hydrOXYzine 10 MG tablet Commonly known as:  ATARAX/VISTARIL Take 1 tablet (10 mg total) by mouth 3 (three) times daily as needed. What changed:  reasons to take this   metoprolol tartrate  25 MG tablet Commonly known as:  LOPRESSOR Take 0.5 tablets (12.5 mg total) by mouth 2 (two) times daily.   nicotine 21 mg/24hr patch Commonly known as:  NICODERM CQ - dosed in mg/24 hours Place 1 patch (21 mg total) onto the skin daily.   nitroGLYCERIN 0.4 MG SL tablet Commonly known as:  NITROSTAT Place  1 tablet (0.4 mg total) under the tongue every 5 (five) minutes x 3 doses as needed for chest pain.   SINUS & ALLERGY PO Take 1 tablet by mouth daily.   tamsulosin 0.4 MG Caps capsule Commonly known as:  FLOMAX Take 2 capsules (0.8 mg total) by mouth daily.   ticagrelor 90 MG Tabs tablet Commonly known as:  BRILINTA Take 1 tablet (90 mg total) by mouth 2 (two) times daily.   traMADol 50 MG tablet Commonly known as:  ULTRAM Take 1 tablet (50 mg total) by mouth every 6 (six) hours as needed for severe pain.   triamcinolone cream 0.1 % Commonly known as:  KENALOG Apply 1 application topically 2 (two) times daily. What changed:  when to take this  reasons to take this        Aspirin prescribed at discharge?  Yes High Intensity Statin Prescribed? (Lipitor 40-80mg  or Crestor 20-40mg ): Yes Beta Blocker Prescribed? Yes For EF <40%, was ACEI/ARB Prescribed? No: EF ok ADP Receptor Inhibitor Prescribed? (i.e. Plavix etc.-Includes Medically Managed Patients): Yes For EF <40%, Aldosterone Inhibitor Prescribed? No: EF ok Was EF assessed during THIS hospitalization? Yes Was Cardiac Rehab II ordered? (Included Medically managed Patients): Yes   Outstanding Labs/Studies   FLP/LFTs in 6 weeks if tolerating statin.   Duration of Discharge Encounter   Greater than 30 minutes including physician time.  Signed, Reino Bellis NP-C 10/10/2016, 11:01 AM   I have examined the patient and reviewed assessment and plan and discussed with patient.  Agree with above as stated.  Right wrist stable.  RRR.  CTA bilaterally.  No edema.  ECG shows NSR with anterior T wave  inversion.  Stressed importance of DAPT.  He will need aggressive secondary prevention and a healthy diet.  Cardiac rehab would be helpful for him.   Larae Grooms

## 2016-10-10 NOTE — Clinical Social Work Note (Signed)
Clinical Social Work Assessment  Patient Details  Name: David Hendricks MRN: [id number] Date of Birth: 1950-11-22  Date of referral:  10/10/16               Reason for consult:  Other (Comment Required) (ride home)                Permission sought to share information with:  Case Manager Permission granted to share information::     Name::     David Hendricks  Agency::     Relationship::     Contact Information:     Housing/Transportation Living arrangements for the past 2 months:  Apartment Source of Information:  Patient Patient Interpreter Needed:  None Criminal Activity/Legal Involvement Pertinent to Current Situation/Hospitalization:  No - Comment as needed Significant Relationships:  None Lives with:  Self Do you feel safe going back to the place where you live?  Yes Need for family participation in patient care:  Yes (Comment)  Care giving concerns:  CSW spoke with pt at bedside. At this time pt has no concerns.    Social Worker assessment / plan:  CSW spoke with pt at bedside. During this time pt informed CSW that pt is from home alone and needs a way back. CSW was informed by RN CM that pt can afford the cab however the money for pt is at home. CSW reached out to [facility].   Employment status:  Retired Forensic scientist:  Medicare PT Recommendations:  Not assessed at this time Information / Referral to community resources:     Patient/Family's Response to care:  Pt is understanding and agreeable to plan of care at this time.   Patient/Family's Understanding of and Emotional Response to Diagnosis, Current Treatment, and Prognosis:  No further questions or concerns have been presented to CSW at this time.   Emotional Assessment Appearance:  Appears stated age Attitude/Demeanor/Rapport:    Affect (typically observed):  Pleasant Orientation:  Oriented to Self, Oriented to Place, Oriented to  Time, Oriented to Situation Alcohol / Substance use:  Not  Applicable Psych involvement (Current and /or in the community):  No (Comment)  Discharge Needs  Concerns to be addressed:  No discharge needs identified Readmission within the last 30 days:  No Current discharge risk:  None Barriers to Discharge:  No Barriers Identified   David Hendricks, [place] 10/10/2016, 10:20 AM

## 2016-10-10 NOTE — Progress Notes (Signed)
TR BAND REMOVAL  LOCATION:    right radial  DEFLATED PER PROTOCOL:    Yes.    TIME BAND OFF / DRESSING APPLIED:    0035   SITE UPON ARRIVAL:    Level 0  SITE AFTER BAND REMOVAL:    Level 0  CIRCULATION SENSATION AND MOVEMENT:    Within Normal Limits   Yes.    COMMENTS:   Radial site teaching reinforced; pt verbalizes understanding.

## 2016-10-11 ENCOUNTER — Telehealth: Payer: Self-pay | Admitting: Pharmacist

## 2016-10-11 NOTE — Progress Notes (Signed)
Contacted patient for hospital follow up NSTEMI to help with medications. Patient refuses medication help and states "it will not make a difference" and "he does not care about his health". Patient hung up the phone.

## 2016-10-12 ENCOUNTER — Telehealth: Payer: Self-pay | Admitting: Interventional Cardiology

## 2016-10-12 DIAGNOSIS — I249 Acute ischemic heart disease, unspecified: Secondary | ICD-10-CM | POA: Diagnosis not present

## 2016-10-12 DIAGNOSIS — I2510 Atherosclerotic heart disease of native coronary artery without angina pectoris: Secondary | ICD-10-CM | POA: Diagnosis not present

## 2016-10-12 DIAGNOSIS — Z720 Tobacco use: Secondary | ICD-10-CM | POA: Diagnosis not present

## 2016-10-12 DIAGNOSIS — Z7982 Long term (current) use of aspirin: Secondary | ICD-10-CM | POA: Diagnosis not present

## 2016-10-12 DIAGNOSIS — E785 Hyperlipidemia, unspecified: Secondary | ICD-10-CM | POA: Diagnosis not present

## 2016-10-12 DIAGNOSIS — I214 Non-ST elevation (NSTEMI) myocardial infarction: Secondary | ICD-10-CM | POA: Diagnosis not present

## 2016-10-12 NOTE — Telephone Encounter (Signed)
°  New Prob   Calling to verify Dr. Irish Lack will sign orders for home health care for this patient. Please call.

## 2016-10-13 ENCOUNTER — Telehealth: Payer: Self-pay

## 2016-10-13 NOTE — Telephone Encounter (Signed)
[name] with [facility] requesting OT and Social worker VO. Please call back.

## 2016-10-13 NOTE — Telephone Encounter (Signed)
**Note De-Identified  Obfuscation** 1st TCM Call attempt. LMTCB.

## 2016-10-13 NOTE — Telephone Encounter (Signed)
VO  For OT for limited movement due to R shoulder injury and csw for depression screening and community services available Do you agree?

## 2016-10-14 NOTE — Telephone Encounter (Signed)
Agree 

## 2016-10-16 ENCOUNTER — Telehealth (HOSPITAL_COMMUNITY): Payer: Self-pay

## 2016-10-16 NOTE — Telephone Encounter (Signed)
Patient insurance is active and benefits verified. Patient insurances is Medicare A/B and Medicaid. Medicare A/B - no co-payment, no deductible, no out of pocket, no co-insurance and no pre-authorization. Passport/reference [phone]. Medicaid - no co-payment, no deductible, no out of pocket, no co-insurance and no pre-authorization. Passport/reference [phone].  Patient will be contacted and scheduled after their follow up appointment with the cardiologist on 10/18/16, upon review by [facility] RN navigator.

## 2016-10-16 NOTE — Telephone Encounter (Signed)
Left detailed message on David Hendricks's VM letting her know that we will defer to PCP at this time.

## 2016-10-16 NOTE — Telephone Encounter (Signed)
**Note De-Identified  Obfuscation** Patient contacted regarding discharge from [facility] on 10/10/16.  Patient understands to follow up with provider Truitt Merle, NP on 10/18/16 at 11:30 at [facility] in [place].. Patient understands discharge instructions? Yes Patient understands medications and regiment? Yes Patient understands to bring all medications to this visit? Yes  The pt c/o diarrhea that started last night. He is advised to take Imodium and to contact his PCP. He verbalized understanding. He denies CP, SOB, dizziness or nausea at this time. He does have [facility]'s phone number to call if he has any questions or concerns.

## 2016-10-17 DIAGNOSIS — I2510 Atherosclerotic heart disease of native coronary artery without angina pectoris: Secondary | ICD-10-CM | POA: Diagnosis not present

## 2016-10-17 DIAGNOSIS — Z720 Tobacco use: Secondary | ICD-10-CM | POA: Diagnosis not present

## 2016-10-17 DIAGNOSIS — I249 Acute ischemic heart disease, unspecified: Secondary | ICD-10-CM | POA: Diagnosis not present

## 2016-10-17 DIAGNOSIS — I214 Non-ST elevation (NSTEMI) myocardial infarction: Secondary | ICD-10-CM | POA: Diagnosis not present

## 2016-10-17 DIAGNOSIS — Z7982 Long term (current) use of aspirin: Secondary | ICD-10-CM | POA: Diagnosis not present

## 2016-10-17 DIAGNOSIS — E785 Hyperlipidemia, unspecified: Secondary | ICD-10-CM | POA: Diagnosis not present

## 2016-10-18 ENCOUNTER — Telehealth: Payer: Self-pay | Admitting: Interventional Cardiology

## 2016-10-18 ENCOUNTER — Ambulatory Visit: Payer: Medicare Other | Admitting: Nurse Practitioner

## 2016-10-18 NOTE — Telephone Encounter (Signed)
New message    Pt has developed a rash on his upper arm where the stent was placed.

## 2016-10-18 NOTE — Telephone Encounter (Signed)
Called patient and he states that he took his hydroxyzine last night at 10 PM and he woke up this morning feeling "drunk" and he did not feel safe to drive so he cancelled his appointment for today. Patient rescheduled his appointment to 10/29 and states that he cannot come before then.   Patient had a cath done on 10/1. He states that there is a small patchy looking rash above the insertion site on his right wrist. Patient denies any bleeding, hematoma, swelling, numbness, tingling, or any temperature or color changes. Patient states that he feels like he is doing well from a cardiac standpoint. Advised for patient to continue to monitor his site and let us know if he develops any other issues.   Patient states that he is not going to take the hydroxyzine again and is requesting something to help his sleep at night. Advised patient to follow up with his PCP regarding this. Patient states that he would like to look for a different PCP. [facility] number provided to the patient. Advised patient to let us know if he needs anything. Patient verbalized understanding and thanked me for the call.

## 2016-10-18 NOTE — Progress Notes (Deleted)
CARDIOLOGY OFFICE NOTE  Date:  10/18/2016    [name] Date of Birth: 21-Oct-1950 Medical Record #[id number]  PCP:  Valinda Party, DO  Cardiologist:  Irish Lack    No chief complaint on file.   History of Present Illness: David Hendricks is a 66 y.o. male who presents today for a *** history of tobacco abuse. He has had intermittent chest discomfort over the past 24 hours prior to admission. The night prior to admission, he had an episode of what he described as a pressure, that lasted anywhere from 30-45 minutes. He thought it was gas. He had more discomfort a few hours later. It moved to his left arm and left side of his face. It persisted even while he was at rest. He came to the ER via EMS.  Initial POC troponin was positive. A lab troponin was also positive. He had some relief of his chest discomfort with nitroglycerin. Was pain free at the time of assessment.  He is followed by Dr. Heber [place]. He has been considering various tools to try to quit smoking. EKG showed NSR with LAFB. Given his symptoms and risk factors he was admitted for further work up.  He was placed on IV heparin and underwent cardiac cath noted above with successful IVIS guided PCI/DES to the pLAD. Also noted mild nonobstructive CAD to the RCA and Lcx. Plan for DAPT with ASA/Brilinta for at least one year. LDL above goal at 125, therefore started on high dose statin. Also added low dose metoprolol with HR and BP tolerating. Post cath labs showed stable Cr 1.08 and Hgb 16.1. He was counseled on the need for smoking cessation. Hgb A1c 5.1. Worked with cardiac rehab without any further chest pain. Trop peaked at 0.67. CM assisted to help with [facility] regarding follow up with medication assistance.  Comes in today. Here with   Past Medical History:  Diagnosis Date  . Anxiety   . Arthritis   . Coronary artery disease    10/18 PCI/DES to pLAD, mild nonobstructive disease in the Lcx/RCA. Normal EF.   Marland Kitchen  Dysrhythmia 2016   irregular heartbeat  . Myocardial infarction ([place])   . Seasonal allergies     Past Surgical History:  Procedure Laterality Date  . CORONARY ANGIOPLASTY  10/09/2016  . CORONARY STENT INTERVENTION N/A 10/09/2016   Procedure: CORONARY STENT INTERVENTION;  Surgeon: Nelva Bush, MD;  Location: [place] CV LAB;  Service: Cardiovascular;  Laterality: N/A;  . INTRAVASCULAR ULTRASOUND/IVUS N/A 10/09/2016   Procedure: Intravascular Ultrasound/IVUS;  Surgeon: Nelva Bush, MD;  Location: [place] CV LAB;  Service: Cardiovascular;  Laterality: N/A;  . LEFT HEART CATH AND CORONARY ANGIOGRAPHY N/A 10/09/2016   Procedure: LEFT HEART CATH AND CORONARY ANGIOGRAPHY;  Surgeon: Nelva Bush, MD;  Location: [place] CV LAB;  Service: Cardiovascular;  Laterality: N/A;     Medications: No outpatient prescriptions have been marked as taking for the 10/18/16 encounter (Appointment) with Burtis Junes, NP.     Allergies: No Known Allergies  Social History: The patient  reports that he has been smoking Cigarettes.  He started smoking about 55 years ago. He has a 90.00 pack-year smoking history. He has never used smokeless tobacco. He reports that he does not drink alcohol or use drugs.   Family History: The patient's ***family history includes Cancer in his father.   Review of Systems: Please see the history of present illness.   Otherwise, the review of systems is positive for {NONE  DEFAULTED:18576::"none"}.   All other systems are reviewed and negative.   Physical Exam: VS:  There were no vitals taken for this visit. Marland Kitchen  BMI There is no height or weight on file to calculate BMI.  Wt Readings from Last 3 Encounters:  10/10/16 202 lb 6.1 oz (91.8 kg)  08/24/16 202 lb 6.4 oz (91.8 kg)  08/08/16 207 lb 12.8 oz (94.3 kg)    General: Pleasant. Well developed, well nourished and in no acute distress.   HEENT: Normal.  Neck: Supple, no JVD, carotid bruits, or  masses noted.  Cardiac: ***Regular rate and rhythm. No murmurs, rubs, or gallops. No edema.  Respiratory:  Lungs are clear to auscultation bilaterally with normal work of breathing.  GI: Soft and nontender.  MS: No deformity or atrophy. Gait and ROM intact.  Skin: Warm and dry. Color is normal.  Neuro:  Strength and sensation are intact and no gross focal deficits noted.  Psych: Alert, appropriate and with normal affect.   LABORATORY DATA:  EKG:  EKG {ACTION; IS/IS IWL:[id number]} ordered today. This demonstrates ***.  Lab Results  Component Value Date   WBC 9.7 10/10/2016   HGB 16.1 10/10/2016   HCT 48.6 10/10/2016   PLT 152 10/10/2016   GLUCOSE 93 10/10/2016   CHOL 181 10/09/2016   TRIG 99 10/09/2016   HDL 36 (L) 10/09/2016   LDLCALC 125 (H) 10/09/2016   ALT 13 (L) 03/15/2016   AST 16 03/15/2016   NA 137 10/10/2016   K 4.1 10/10/2016   CL 102 10/10/2016   CREATININE 1.08 10/10/2016   BUN 15 10/10/2016   CO2 30 10/10/2016   INR 0.99 10/09/2016   HGBA1C 5.1 10/09/2016     BNP (last 3 results) No results for input(s): BNP in the last 8760 hours.  ProBNP (last 3 results) No results for input(s): PROBNP in the last 8760 hours.   Other Studies Reviewed Today:  Cath: 10/09/16  Conclusion   Conclusions: 1. Severe single-vessel coronary artery disease with serial 50% ostial and 90% proximal/mid LAD stenoses. 2. Mild, nonobstructive CAD involving the LCx and RCA. 3. Subtle mid and apical anterior hypokinesis with otherwise preserved left ventricular contraction; LVEF low normal at 50-55%. 4. Upper normal left ventricular filling pressure. 5. Successful IVIS guided PCI to the proximal/mid LAD with placement of a Resolute Onyx 3.5 x 22 mm drug-eluting stent, post dilated proximally with a 4.0 mm [place] balloon. The first diagonal branch was jailed by the stent but demonstrates only mild ostial stenosis post PCI with TIMI-3 flow.  Recommendations: 1. Dual antiplatelet  therapy with aspirin and ticagrelor for at least 12 months, ideally longer. 2. Aggressive secondary prevention, including high intensity statin therapy and smoking cessation.  Nelva Bush, MD   Assessment/Plan:   Current medicines are reviewed with the patient today.  The patient does not have concerns regarding medicines other than what has been noted above.  The following changes have been made:  See above.  Labs/ tests ordered today include:   No orders of the defined types were placed in this encounter.    Disposition:   FU with *** in {gen number 4-17:408144} {Days to years:10300}.   Patient is agreeable to this plan and will call if any problems develop in the interim.   SignedTruitt Merle, NP  10/18/2016 7:38 AM  [facility] [address] [place], [place]  [address] Phone: [phone] Fax: [phone]

## 2016-10-18 NOTE — Telephone Encounter (Signed)
Returned call to [place], [facility] nurse who states that the patient had to cancel his appointment today because he felt "drunk" after taking hydroxyzine. She states that the patient told her that he developed a rash on his arm near the insertion site for the cath. She did not know any other details. Will call the patient to assess further.

## 2016-10-23 DIAGNOSIS — I214 Non-ST elevation (NSTEMI) myocardial infarction: Secondary | ICD-10-CM | POA: Diagnosis not present

## 2016-10-23 DIAGNOSIS — I2510 Atherosclerotic heart disease of native coronary artery without angina pectoris: Secondary | ICD-10-CM | POA: Diagnosis not present

## 2016-10-23 DIAGNOSIS — Z7982 Long term (current) use of aspirin: Secondary | ICD-10-CM | POA: Diagnosis not present

## 2016-10-23 DIAGNOSIS — Z720 Tobacco use: Secondary | ICD-10-CM | POA: Diagnosis not present

## 2016-10-23 DIAGNOSIS — I249 Acute ischemic heart disease, unspecified: Secondary | ICD-10-CM | POA: Diagnosis not present

## 2016-10-23 DIAGNOSIS — E785 Hyperlipidemia, unspecified: Secondary | ICD-10-CM | POA: Diagnosis not present

## 2016-10-24 ENCOUNTER — Telehealth: Payer: Self-pay | Admitting: Internal Medicine

## 2016-10-24 DIAGNOSIS — Z7982 Long term (current) use of aspirin: Secondary | ICD-10-CM | POA: Diagnosis not present

## 2016-10-24 DIAGNOSIS — I2510 Atherosclerotic heart disease of native coronary artery without angina pectoris: Secondary | ICD-10-CM | POA: Diagnosis not present

## 2016-10-24 DIAGNOSIS — I214 Non-ST elevation (NSTEMI) myocardial infarction: Secondary | ICD-10-CM | POA: Diagnosis not present

## 2016-10-24 DIAGNOSIS — Z720 Tobacco use: Secondary | ICD-10-CM | POA: Diagnosis not present

## 2016-10-24 DIAGNOSIS — E785 Hyperlipidemia, unspecified: Secondary | ICD-10-CM | POA: Diagnosis not present

## 2016-10-24 DIAGNOSIS — I249 Acute ischemic heart disease, unspecified: Secondary | ICD-10-CM | POA: Diagnosis not present

## 2016-10-24 NOTE — Telephone Encounter (Signed)
Pls call [name] needs verbal order for patient, for physical therapy.

## 2016-10-26 NOTE — Telephone Encounter (Signed)
Agree with PT eval and treat

## 2016-10-26 NOTE — Telephone Encounter (Signed)
VO given for PT eval and treat, do you agree?

## 2016-10-27 DIAGNOSIS — Z720 Tobacco use: Secondary | ICD-10-CM | POA: Diagnosis not present

## 2016-10-27 DIAGNOSIS — I2510 Atherosclerotic heart disease of native coronary artery without angina pectoris: Secondary | ICD-10-CM | POA: Diagnosis not present

## 2016-10-27 DIAGNOSIS — I249 Acute ischemic heart disease, unspecified: Secondary | ICD-10-CM | POA: Diagnosis not present

## 2016-10-27 DIAGNOSIS — I214 Non-ST elevation (NSTEMI) myocardial infarction: Secondary | ICD-10-CM | POA: Diagnosis not present

## 2016-10-27 DIAGNOSIS — E785 Hyperlipidemia, unspecified: Secondary | ICD-10-CM | POA: Diagnosis not present

## 2016-10-27 DIAGNOSIS — Z7982 Long term (current) use of aspirin: Secondary | ICD-10-CM | POA: Diagnosis not present

## 2016-11-06 ENCOUNTER — Telehealth: Payer: Self-pay | Admitting: Interventional Cardiology

## 2016-11-06 ENCOUNTER — Encounter: Payer: Self-pay | Admitting: Physician Assistant

## 2016-11-06 ENCOUNTER — Telehealth: Payer: Self-pay | Admitting: *Deleted

## 2016-11-06 ENCOUNTER — Ambulatory Visit (INDEPENDENT_AMBULATORY_CARE_PROVIDER_SITE_OTHER): Payer: Medicare Other | Admitting: Physician Assistant

## 2016-11-06 VITALS — BP 104/60 | HR 86 | Ht 73.0 in | Wt 210.8 lb

## 2016-11-06 DIAGNOSIS — I214 Non-ST elevation (NSTEMI) myocardial infarction: Secondary | ICD-10-CM

## 2016-11-06 DIAGNOSIS — Z720 Tobacco use: Secondary | ICD-10-CM | POA: Diagnosis not present

## 2016-11-06 DIAGNOSIS — I2510 Atherosclerotic heart disease of native coronary artery without angina pectoris: Secondary | ICD-10-CM | POA: Diagnosis not present

## 2016-11-06 DIAGNOSIS — Z7982 Long term (current) use of aspirin: Secondary | ICD-10-CM | POA: Diagnosis not present

## 2016-11-06 DIAGNOSIS — E782 Mixed hyperlipidemia: Secondary | ICD-10-CM

## 2016-11-06 DIAGNOSIS — I249 Acute ischemic heart disease, unspecified: Secondary | ICD-10-CM | POA: Diagnosis not present

## 2016-11-06 DIAGNOSIS — E785 Hyperlipidemia, unspecified: Secondary | ICD-10-CM | POA: Diagnosis not present
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
## 2016-11-06 NOTE — Telephone Encounter (Signed)
[name] is calling to get verbal orders for Care .Marland Kitchen Please call

## 2016-11-06 NOTE — Telephone Encounter (Signed)
David Hendricks PT for [facility] calls for VO for 2xwk for 3 weeks- balance, gait, safety, strength building. Do you agree?

## 2016-11-06 NOTE — Telephone Encounter (Signed)
David Hendricks at [facility] aware that the patient's home care orders are managed by PCP. She verbalized understanding.

## 2016-11-06 NOTE — Patient Instructions (Addendum)
Your physician recommends that you continue on your current medications as directed. Please refer to the Current Medication list given to you today.   You have been referred to [facility] physician recommends that you return for lab work in: [place] OF November    Your physician recommends that you schedule a follow-up appointment in: [place]

## 2016-11-06 NOTE — Telephone Encounter (Signed)
I agree

## 2016-11-08 ENCOUNTER — Telehealth (HOSPITAL_COMMUNITY): Payer: Self-pay

## 2016-11-08 ENCOUNTER — Telehealth: Payer: Self-pay | Admitting: Interventional Cardiology

## 2016-11-08 NOTE — Telephone Encounter (Signed)
New Message     They faxed over a medication reimbursement form to Dr Irish Lack that need filled out and sent back asap

## 2016-11-08 NOTE — Telephone Encounter (Signed)
Faxed medicaid reimbursement form to Dr.Varansi - Called Dr office and nurse stated that Dr.Varansi will be out of office until Monday. Will receive completed form then.

## 2016-11-08 NOTE — Telephone Encounter (Signed)
Called and spoke to [place] at [facility] and made her aware that we have not received the form yet. She states that she will fax over the form again. Made her aware that Dr. Irish Lack will be back in the office next week and I will have him look at it then. She verbalized understanding.

## 2016-11-14 ENCOUNTER — Telehealth: Payer: Self-pay | Admitting: Internal Medicine

## 2016-11-14 DIAGNOSIS — Z7982 Long term (current) use of aspirin: Secondary | ICD-10-CM | POA: Diagnosis not present

## 2016-11-14 DIAGNOSIS — I249 Acute ischemic heart disease, unspecified: Secondary | ICD-10-CM | POA: Diagnosis not present

## 2016-11-14 DIAGNOSIS — I214 Non-ST elevation (NSTEMI) myocardial infarction: Secondary | ICD-10-CM | POA: Diagnosis not present

## 2016-11-14 DIAGNOSIS — Z720 Tobacco use: Secondary | ICD-10-CM | POA: Diagnosis not present

## 2016-11-14 DIAGNOSIS — I2510 Atherosclerotic heart disease of native coronary artery without angina pectoris: Secondary | ICD-10-CM | POA: Diagnosis not present

## 2016-11-14 DIAGNOSIS — E785 Hyperlipidemia, unspecified: Secondary | ICD-10-CM | POA: Diagnosis not present

## 2016-11-14 NOTE — Telephone Encounter (Signed)
[facility] will like to speak with a nurse

## 2016-11-15 NOTE — Telephone Encounter (Signed)
David Hendricks,  David Hendricks needs to be [facility] for [facility] issues.  I do not prescribe him tramadol chronically.  He has received it in acute circumstances and would need to be seen if he requires pain medication now.

## 2016-11-15 NOTE — Telephone Encounter (Signed)
Attempted to call patient to see if he has began HHPT - Lm on Vm

## 2016-11-15 NOTE — Telephone Encounter (Signed)
PT called to state that every night he takes dinner and then his meds then his meds and then straight to bed at this time he "throws up" and feels better, this happens every night. Also he c/o shoulder pain, states he has been seen for it but no one did anything.. Also told PT that he does not want any HH people coming back, the week before he cancelled the [facility].  This is just an FYI He would also like a refill of tramadol

## 2016-11-15 NOTE — Telephone Encounter (Signed)
Called and spoke with patient in regards to Cardiac Rehab - Patient is interested in the program. Scheduled orientation on 12/07/2016 at 1:30pm. Patient will attend the 1:15pm exc class.

## 2016-11-21 NOTE — Telephone Encounter (Signed)
Unable to reach pt via ph, gave message to [facility]

## 2016-11-29 ENCOUNTER — Telehealth (HOSPITAL_COMMUNITY): Payer: Self-pay

## 2016-11-29 NOTE — Telephone Encounter (Signed)
Patient called and left message to reschedule orientation and exc classes due to patient going out of town for the Christmas Holiday. Attempted to return patients phone call - Lm on Vm

## 2016-12-04 ENCOUNTER — Telehealth: Payer: Self-pay | Admitting: *Deleted

## 2016-12-04 ENCOUNTER — Other Ambulatory Visit: Payer: Medicare Other | Admitting: *Deleted

## 2016-12-04 DIAGNOSIS — E782 Mixed hyperlipidemia: Secondary | ICD-10-CM | POA: Diagnosis not present

## 2016-12-04 DIAGNOSIS — R945 Abnormal results of liver function studies: Principal | ICD-10-CM

## 2016-12-04 DIAGNOSIS — R7989 Other specified abnormal findings of blood chemistry: Secondary | ICD-10-CM

## 2016-12-04 LAB — HEPATIC FUNCTION PANEL
ALBUMIN: 4 g/dL (ref 3.6–4.8)
ALK PHOS: 135 IU/L — AB (ref 39–117)
ALT: 15 IU/L (ref 0–44)
AST: 15 IU/L (ref 0–40)
Bilirubin Total: 0.6 mg/dL (ref 0.0–1.2)
Bilirubin, Direct: 0.22 mg/dL (ref 0.00–0.40)
Total Protein: 6.3 g/dL (ref 6.0–8.5)

## 2016-12-04 LAB — LIPID PANEL
CHOL/HDL RATIO: 2.4 ratio (ref 0.0–5.0)
Cholesterol, Total: 106 mg/dL (ref 100–199)
HDL: 45 mg/dL (ref >39)
LDL Calculated: 38 mg/dL (ref 0–99)
TRIGLYCERIDES: 113 mg/dL (ref 0–149)
VLDL Cholesterol Cal: 23 mg/dL (ref 5–40)

## 2016-12-04 NOTE — Telephone Encounter (Signed)
Pt has been notified of lab results by phone with verbal understanding. Pt agreeable to repeat LFT 02/07/17. Pt thanked me for my call.

## 2016-12-04 NOTE — Telephone Encounter (Signed)
-----  Message from Michele M Lenze, PA-C sent at 12/04/2016  3:47 PM EST ----- Excellent lipid panel.  Alk phos liver marker slightly elevated.  Repeat LFTs in 2 months. 

## 2016-12-05 ENCOUNTER — Telehealth (HOSPITAL_COMMUNITY): Payer: Self-pay

## 2016-12-07 ENCOUNTER — Telehealth (HOSPITAL_COMMUNITY): Payer: Self-pay

## 2016-12-07 ENCOUNTER — Ambulatory Visit (HOSPITAL_COMMUNITY): Payer: Medicare Other

## 2016-12-07 NOTE — Telephone Encounter (Signed)
Attempted to call patient to reschedule orientation - Lm on Vm

## 2016-12-11 ENCOUNTER — Ambulatory Visit (HOSPITAL_COMMUNITY): Payer: Medicare Other

## 2016-12-13 ENCOUNTER — Ambulatory Visit (HOSPITAL_COMMUNITY): Payer: Medicare Other

## 2016-12-15 ENCOUNTER — Ambulatory Visit (HOSPITAL_COMMUNITY): Payer: Medicare Other

## 2016-12-18 ENCOUNTER — Ambulatory Visit (HOSPITAL_COMMUNITY): Payer: Medicare Other

## 2016-12-20 ENCOUNTER — Ambulatory Visit (HOSPITAL_COMMUNITY): Payer: Medicare Other

## 2016-12-22 ENCOUNTER — Ambulatory Visit (HOSPITAL_COMMUNITY): Payer: Medicare Other

## 2016-12-25 ENCOUNTER — Ambulatory Visit (HOSPITAL_COMMUNITY): Payer: Medicare Other

## 2016-12-25 ENCOUNTER — Ambulatory Visit: Payer: Medicare Other | Admitting: Interventional Cardiology

## 2016-12-27 ENCOUNTER — Ambulatory Visit (HOSPITAL_COMMUNITY): Payer: Medicare Other

## 2016-12-29 ENCOUNTER — Other Ambulatory Visit: Payer: Self-pay | Admitting: Internal Medicine

## 2016-12-29 ENCOUNTER — Ambulatory Visit (HOSPITAL_COMMUNITY): Payer: Medicare Other

## 2017-01-03 ENCOUNTER — Ambulatory Visit (HOSPITAL_COMMUNITY): Payer: Medicare Other

## 2017-01-05 ENCOUNTER — Ambulatory Visit (HOSPITAL_COMMUNITY): Payer: Medicare Other

## 2017-01-05 ENCOUNTER — Telehealth (HOSPITAL_COMMUNITY): Payer: Self-pay

## 2017-01-05 NOTE — Telephone Encounter (Signed)
Patient did not return phone call to reschedule orientation for Cardiac Rehab. Closed referral.

## 2017-01-08 ENCOUNTER — Ambulatory Visit (HOSPITAL_COMMUNITY): Payer: Medicare Other

## 2017-01-10 ENCOUNTER — Ambulatory Visit (HOSPITAL_COMMUNITY): Payer: Medicare Other

## 2017-01-10 ENCOUNTER — Other Ambulatory Visit: Payer: Self-pay | Admitting: *Deleted

## 2017-01-10 NOTE — Telephone Encounter (Signed)
Spoke w/ dr Heber Melstone r/t refills of tramadol and tamsulosin, pt will need to be seen, tramadol not filled since 07/2016, tamsulosin 03/02/16 #30 w/ 3 additional.  Called pt he stated that he has been calling for 7 to 8 months trying to get refills and no one refills them, he feels [facility] does not have his best interest at heart that he should get things when he expects them, he states that when he calls for appts that no one will give him one and no one ever calls him back. He states he ask for a refill of tramadol at his appt and was ignored, he was offered an appt today and tomorrow several times and refused. He stated he was going to get a new doctor that would act as soon as he ask for something. He was reminded of the 48 hour policy for refills. He was informed that it is his right to go wherever he feels most comfortable and cared for, he was again offered an appt and takes appt 1/3 at 1415 w/ dr Heber [place]. He states he will straighten [facility] out at the appt or he will go elsewhere.  Last appt 8/16 Was ask to return for f/u 11/2016, no appt scheduled appt made today for 1/3 Last refill tramadol 7/31 #10 for kidney stone Last tamsulosin 2/2 #30 w/ 2 refills to facilitate passing of kidney stone Pt states he takes tramadol for back pain

## 2017-01-11 ENCOUNTER — Encounter: Payer: Medicare Other | Admitting: Internal Medicine

## 2017-01-12 ENCOUNTER — Ambulatory Visit (HOSPITAL_COMMUNITY): Payer: Medicare Other

## 2017-01-15 ENCOUNTER — Ambulatory Visit (HOSPITAL_COMMUNITY): Payer: Medicare Other

## 2017-01-17 ENCOUNTER — Ambulatory Visit (HOSPITAL_COMMUNITY): Payer: Medicare Other

## 2017-01-18 DIAGNOSIS — Z955 Presence of coronary angioplasty implant and graft: Secondary | ICD-10-CM | POA: Diagnosis not present

## 2017-01-19 ENCOUNTER — Ambulatory Visit (HOSPITAL_COMMUNITY): Payer: Medicare Other

## 2017-01-22 ENCOUNTER — Ambulatory Visit (HOSPITAL_COMMUNITY): Payer: Medicare Other

## 2017-01-23 ENCOUNTER — Emergency Department (HOSPITAL_COMMUNITY): Payer: Medicare Other

## 2017-01-23 ENCOUNTER — Observation Stay (HOSPITAL_COMMUNITY)
Admission: EM | Admit: 2017-01-23 | Discharge: 2017-01-24 | Disposition: A | Discharge status: Home / Self Care | Payer: Medicare Other | Attending: Family Medicine | Admitting: Family Medicine

## 2017-01-23 DIAGNOSIS — I10 Essential (primary) hypertension: Secondary | ICD-10-CM | POA: Diagnosis not present

## 2017-01-23 DIAGNOSIS — I252 Old myocardial infarction: Secondary | ICD-10-CM

## 2017-01-23 DIAGNOSIS — F17210 Nicotine dependence, cigarettes, uncomplicated: Secondary | ICD-10-CM | POA: Insufficient documentation

## 2017-01-23 DIAGNOSIS — Z79899 Other long term (current) drug therapy: Secondary | ICD-10-CM | POA: Diagnosis not present

## 2017-01-23 DIAGNOSIS — Z720 Tobacco use: Secondary | ICD-10-CM | POA: Diagnosis present

## 2017-01-23 DIAGNOSIS — R0602 Shortness of breath: Secondary | ICD-10-CM | POA: Diagnosis not present

## 2017-01-23 DIAGNOSIS — R079 Chest pain, unspecified: Principal | ICD-10-CM | POA: Diagnosis present

## 2017-01-23 DIAGNOSIS — Z955 Presence of coronary angioplasty implant and graft: Secondary | ICD-10-CM | POA: Insufficient documentation

## 2017-01-23 DIAGNOSIS — E785 Hyperlipidemia, unspecified: Secondary | ICD-10-CM | POA: Diagnosis present

## 2017-01-23 DIAGNOSIS — Z7982 Long term (current) use of aspirin: Secondary | ICD-10-CM | POA: Diagnosis not present

## 2017-01-23 DIAGNOSIS — I2510 Atherosclerotic heart disease of native coronary artery without angina pectoris: Secondary | ICD-10-CM | POA: Insufficient documentation

## 2017-01-23 DIAGNOSIS — R0789 Other chest pain: Secondary | ICD-10-CM | POA: Diagnosis not present

## 2017-01-23 DIAGNOSIS — R110 Nausea: Secondary | ICD-10-CM | POA: Diagnosis not present

## 2017-01-23 LAB — CBC
HEMATOCRIT: 46.6 % (ref 39.0–52.0)
Hemoglobin: 16.1 g/dL (ref 13.0–17.0)
MCH: 29.3 pg (ref 26.0–34.0)
MCHC: 34.5 g/dL (ref 30.0–36.0)
MCV: 84.7 fL (ref 78.0–100.0)
PLATELETS: 168 10*3/uL (ref 150–400)
RBC: 5.5 MIL/uL (ref 4.22–5.81)
RDW: 13.4 % (ref 11.5–15.5)
WBC: 13.6 10*3/uL — ABNORMAL HIGH (ref 4.0–10.5)

## 2017-01-23 LAB — BASIC METABOLIC PANEL
Anion gap: 10 (ref 5–15)
BUN: 9 mg/dL (ref 6–20)
CHLORIDE: 101 mmol/L (ref 101–111)
CO2: 25 mmol/L (ref 22–32)
CREATININE: 1.08 mg/dL (ref 0.61–1.24)
Calcium: 9 mg/dL (ref 8.9–10.3)
GFR calc Af Amer: >60 mL/min (ref >60)
GFR calc non Af Amer: >60 mL/min (ref >60)
GLUCOSE: 96 mg/dL (ref 65–99)
POTASSIUM: 4.1 mmol/L (ref 3.5–5.1)
SODIUM: 136 mmol/L (ref 135–145)

## 2017-01-23 LAB — I-STAT TROPONIN, ED: Troponin i, poc: 0 ng/mL (ref 0.00–0.08)

## 2017-01-23 MED ORDER — TICAGRELOR 90 MG PO TABS
90.0000 mg | ORAL_TABLET | Freq: Two times a day (BID) | ORAL | Status: DC
  Administered 2017-01-24 (×2): 90 mg via ORAL
  Filled 2017-01-23 (×2): qty 1

## 2017-01-23 MED ORDER — TAMSULOSIN HCL 0.4 MG PO CAPS
0.8000 mg | ORAL_CAPSULE | Freq: Every day | ORAL | Status: DC
  Administered 2017-01-24: 0.8 mg via ORAL
  Filled 2017-01-23: qty 2

## 2017-01-23 MED ORDER — ACETAMINOPHEN 325 MG PO TABS: 650.0000 mg | ORAL_TABLET | ORAL | Status: DC | PRN

## 2017-01-23 MED ORDER — ONDANSETRON HCL 4 MG/2ML IJ SOLN: 4.0000 mg | Freq: Four times a day (QID) | INTRAMUSCULAR | Status: DC | PRN

## 2017-01-23 MED ORDER — BUPROPION HCL ER (SR) 150 MG PO TB12
300.0000 mg | ORAL_TABLET | Freq: Every day | ORAL | Status: DC
  Administered 2017-01-24: 300 mg via ORAL
  Filled 2017-01-23 (×2): qty 2

## 2017-01-23 MED ORDER — FENTANYL CITRATE (PF) 100 MCG/2ML IJ SOLN
25.0000 ug | Freq: Once | INTRAMUSCULAR | Status: AC
  Administered 2017-01-23: 25 ug via INTRAVENOUS
  Filled 2017-01-23: qty 2

## 2017-01-23 MED ORDER — ATORVASTATIN CALCIUM 80 MG PO TABS
80.0000 mg | ORAL_TABLET | Freq: Every day | ORAL | Status: DC
  Filled 2017-01-23: qty 1

## 2017-01-23 MED ORDER — METOPROLOL TARTRATE 12.5 MG HALF TABLET
12.5000 mg | ORAL_TABLET | Freq: Two times a day (BID) | ORAL | Status: DC
  Administered 2017-01-24: 12.5 mg via ORAL
  Filled 2017-01-23 (×2): qty 1

## 2017-01-23 MED ORDER — ZOLPIDEM TARTRATE 5 MG PO TABS
5.0000 mg | ORAL_TABLET | Freq: Every evening | ORAL | Status: DC | PRN
  Administered 2017-01-24: 5 mg via ORAL
  Filled 2017-01-23: qty 1

## 2017-01-23 MED ORDER — MORPHINE SULFATE (PF) 4 MG/ML IV SOLN: 2.0000 mg | INTRAVENOUS | Status: DC | PRN

## 2017-01-23 MED ORDER — NITROGLYCERIN 0.4 MG SL SUBL: 0.4000 mg | SUBLINGUAL_TABLET | SUBLINGUAL | Status: DC | PRN

## 2017-01-23 MED ORDER — HEPARIN SODIUM (PORCINE) 5000 UNIT/ML IJ SOLN
5000.0000 [IU] | Freq: Three times a day (TID) | INTRAMUSCULAR | Status: DC
  Administered 2017-01-24 (×3): 5000 [IU] via SUBCUTANEOUS
  Filled 2017-01-23 (×3): qty 1

## 2017-01-23 MED ORDER — ASPIRIN EC 325 MG PO TBEC: 325.0000 mg | DELAYED_RELEASE_TABLET | Freq: Every day | ORAL | Status: DC

## 2017-01-23 MED ORDER — ASPIRIN EC 81 MG PO TBEC
81.0000 mg | DELAYED_RELEASE_TABLET | Freq: Every day | ORAL | Status: DC
  Administered 2017-01-24: 81 mg via ORAL
  Filled 2017-01-23: qty 1

## 2017-01-23 MED ORDER — HYDRALAZINE HCL 20 MG/ML IJ SOLN: 10.0000 mg | Freq: Three times a day (TID) | INTRAMUSCULAR | Status: DC | PRN

## 2017-01-23 MED ORDER — SODIUM CHLORIDE 0.9 % IV BOLUS (SEPSIS)
500.0000 mL | Freq: Once | INTRAVENOUS | Status: AC
  Administered 2017-01-24: 500 mL via INTRAVENOUS

## 2017-01-23 NOTE — ED Notes (Signed)
Paged Internal Medicine

## 2017-01-23 NOTE — ED Provider Notes (Signed)
[place] EMERGENCY DEPARTMENT Provider Note   CSN: [id number] Arrival date & time: 01/23/17  1628    History   Chief Complaint Chief Complaint  Patient presents with  . Chest Pain    HPI David Hendricks is a 68 y.o. male who presents with chest pain. PMH significant for hx of NSTEMI s/p PCI to LAD, HTN, HLD, tobacco abuse. NSTEMI was in October and cath revealed severe stenosis of LAD and mild nonobstructive CAD in the LCx and RCA. EF was 50-55%.  He states that over the past several weeks he has had cough, congestion, and felt fatigued. At [facility] today he was going to walk his dog in the park and had acute onset of fatigue and nausea.  He started coughing and then was having chest pain and his right arm was jerking.  He sat down to rest but the chest pain persisted.  It feels like a pressure and is over the left side of his chest.  As 4 out of 10 in severity.  He became worried so called 911.  Advised him to take full-strength aspirin and nitroglycerin.  He was transported by EMS.  He still has the chest pressure which is currently a 2 out of 10.  It feels different from his prior NSTEMI in that the pain is not as severe.  HPI  Past Medical History:  Diagnosis Date  . Anxiety   . Arthritis   . Coronary artery disease    10/18 PCI/DES to pLAD, mild nonobstructive disease in the Lcx/RCA. Normal EF.   Marland Kitchen Dysrhythmia 2016   irregular heartbeat  . Myocardial infarction ([place])   . Seasonal allergies     Patient Active Problem List   Diagnosis Date Noted  . Hyperlipidemia 10/10/2016  . NSTEMI (non-ST elevated myocardial infarction) ([place]) 10/09/2016  . Tobacco abuse 08/28/2016  . Difficulty sleeping 04/22/2016  . Periodic health assessment, general screening, adult 04/22/2016  . Urinary dysfunction 04/22/2016  . Atopic dermatitis 03/23/2016  . Urinary frequency 02/11/2016  . Right shoulder pain 02/11/2016    Past Surgical History:  Procedure Laterality Date  .  CORONARY ANGIOPLASTY  10/09/2016  . CORONARY STENT INTERVENTION N/A 10/09/2016   Procedure: CORONARY STENT INTERVENTION;  Surgeon: Nelva Bush, MD;  Location: [place] [facility];  Service: Cardiovascular;  Laterality: N/A;  . INTRAVASCULAR ULTRASOUND/IVUS N/A 10/09/2016   Procedure: Intravascular Ultrasound/IVUS;  Surgeon: Nelva Bush, MD;  Location: [place] [facility];  Service: Cardiovascular;  Laterality: N/A;  . LEFT HEART CATH AND CORONARY ANGIOGRAPHY N/A 10/09/2016   Procedure: LEFT HEART CATH AND CORONARY ANGIOGRAPHY;  Surgeon: Nelva Bush, MD;  Location: [place] [facility];  Service: Cardiovascular;  Laterality: N/A;       Home Medications    Prior to Admission medications   Medication Sig Start Date End Date Taking? Authorizing Provider  aspirin 81 MG EC tablet Take 1 tablet (81 mg total) by mouth daily. 10/10/16   Cheryln Manly, NP  atorvastatin (LIPITOR) 80 MG tablet Take 1 tablet (80 mg total) by mouth daily at 6 PM. 10/10/16   Cheryln Manly, NP  buPROPion [name] SR) 150 MG 12 hr tablet Take 1 tablet (150 mg total) by mouth 2 (two) times daily. For the first 3 days take one tablet afterwards take 2 tablets daily Patient taking differently: Take 150 mg by mouth daily. For the first 3 days take one tablet afterwards take 2 tablets daily 08/24/16 08/24/17  Valinda Party, DO  Chlorpheniramine-Phenylephrine (SINUS & ALLERGY PO) Take 1 tablet by mouth daily.    [provider]  hydrOXYzine (ATARAX/VISTARIL) 10 MG tablet Take 1 tablet (10 mg total) by mouth 3 (three) times daily as needed. Patient taking differently: Take 10 mg by mouth 3 (three) times daily as needed for anxiety.  03/24/16   Norman Herrlich, MD  metoprolol tartrate (LOPRESSOR) 25 MG tablet Take 0.5 tablets (12.5 mg total) by mouth 2 (two) times daily. 10/10/16   Cheryln Manly, NP  nicotine (NICODERM CQ - DOSED IN MG/24 HOURS) 21 mg/24hr patch Place 1 patch (21 mg total) onto  the skin daily. 10/10/16 10/10/17  Cheryln Manly, NP  nitroGLYCERIN (NITROSTAT) 0.4 MG SL tablet Place 1 tablet (0.4 mg total) under the tongue every 5 (five) minutes x 3 doses as needed for chest pain. 10/10/16   Cheryln Manly, NP  Skin Protectants, Misc. (EUCERIN) cream Apply topically as needed for dry skin. Patient taking differently: Apply 1 application topically daily as needed for dry skin.  03/24/16   Norman Herrlich, MD  tamsulosin (FLOMAX) 0.4 MG CAPS capsule Take 2 capsules (0.8 mg total) by mouth daily. 02/11/16   Ledell Noss, MD  ticagrelor (BRILINTA) 90 MG TABS tablet Take 1 tablet (90 mg total) by mouth 2 (two) times daily. 10/10/16   Cheryln Manly, NP  traMADol (ULTRAM) 50 MG tablet Take 1 tablet (50 mg total) by mouth every 6 (six) hours as needed for severe pain. 08/08/16   Kalman Shan Ratliff, DO  triamcinolone cream (KENALOG) 0.1 % Apply 1 application topically 2 (two) times daily. Patient taking differently: Apply 1 application topically 2 (two) times daily as needed (rash).  04/20/16   Valinda Party, DO    Family History Family History  Problem Relation Age of Onset  . Cancer Father     Social History Social History   Tobacco Use  . Smoking status: Current Every Day Smoker    Packs/day: 2.00    Years: 45.00    Pack years: 90.00    Types: Cigarettes    Start date: 01/09/1961  . Smokeless tobacco: Never Used  . Tobacco comment: thinking about after settling in   Substance Use Topics  . Alcohol use: No  . Drug use: No     Allergies   Patient has no known allergies.   Review of Systems Review of Systems  Constitutional: Positive for fatigue. Negative for chills and fever.  HENT: Negative for congestion.   Respiratory: Positive for cough and shortness of breath. Negative for wheezing.   Cardiovascular: Positive for chest pain. Negative for palpitations and leg swelling.  Gastrointestinal: Positive for nausea. Negative for abdominal pain  and vomiting.  Neurological: Negative for syncope and light-headedness.  All other systems reviewed and are negative.    Physical Exam Updated Vital Signs BP 126/67 (BP Location: Right Arm)   Pulse 85   Temp 98.3 F (36.8 C) (Oral)   Resp 15   Ht 6\' 1"  (1.854 m)   Wt 94.3 kg (208 lb)   SpO2 97%   BMI 27.44 kg/m   Physical Exam  Constitutional: He is oriented to person, place, and time. He appears well-developed and well-nourished. No distress.  HENT:  Head: Normocephalic and atraumatic.  Eyes: Conjunctivae are normal. Pupils are equal, round, and reactive to light. Right eye exhibits no discharge. Left eye exhibits no discharge. No scleral icterus.  Neck: Normal range of motion.  Cardiovascular: Normal rate and  regular rhythm. Exam reveals no gallop and no friction rub.  No murmur heard. Pulmonary/Chest: Effort normal and breath sounds normal. No stridor. No respiratory distress. He has no wheezes. He has no rales. He exhibits no tenderness.  Abdominal: Soft. Bowel sounds are normal. He exhibits no distension. There is no tenderness.  Musculoskeletal:  No lower leg edema  Neurological: He is alert and oriented to person, place, and time.  Skin: Skin is warm and dry.  Psychiatric: He has a normal mood and affect. His behavior is normal.  Nursing note and vitals reviewed.    ED Treatments / Results  Labs (all labs ordered are listed, but only abnormal results are displayed) Labs Reviewed  CBC - Abnormal; Notable for the following components:      Result Value   WBC 13.6 (*)    All other components within normal limits  BASIC METABOLIC PANEL  I-STAT TROPONIN, ED    EKG  EKG Interpretation  Date/Time:  Tuesday January 23 2017 16:28:38 EST Ventricular Rate:  86 PR Interval:    QRS Duration: 120 QT Interval:  382 QTC Calculation: 457 R Axis:   -66 Text Interpretation:  Sinus rhythm Left anterior fascicular block resolved TWI when compared to prior No STEMI  Otherwise no significant change Confirmed by Addison Lank [phone]) on 01/23/2017 6:28:14 PM       Radiology Dg Chest 2 View  Result Date: 01/23/2017 CLINICAL DATA:  Lower left-sided chest pain. History of MI in November. EXAM: CHEST  2 VIEW COMPARISON:  Chest x-ray dated 10/09/2016. FINDINGS: The heart size and mediastinal contours are within normal limits. Atherosclerotic changes noted at the aortic arch. Both lungs are clear. No pleural effusion or pneumothorax seen. The visualized skeletal structures are unremarkable. IMPRESSION: No active cardiopulmonary disease. No evidence of pneumonia or pulmonary edema. Aortic atherosclerosis Electronically Signed   By: Franki Cabot M.D.   On: 01/23/2017 17:18    Procedures Procedures (including critical care time)  Medications Ordered in ED Medications  fentaNYL (SUBLIMAZE) injection 25 mcg (not administered)     Initial Impression / Assessment and Plan / ED Course  I have reviewed the triage vital signs and the nursing notes.  Pertinent labs & imaging results that were available during my care of the patient were reviewed by me and considered in my medical decision making (see chart for details).  66 year old male presents with chest pressure which is somewhat similar to prior episode of chest pain. Vitals are normal. EKG is normal. Trop is normal. CBC is remarkable for mild leukocytosis of 13.6. BMP is normal. He still has mild chest pressure on my exam. Blood pressure is soft so fentanyl ordered. Spoke with IM team who will admit.  Final Clinical Impressions(s) / ED Diagnoses   Final diagnoses:  Nonspecific chest pain    ED Discharge Orders    None       Recardo Evangelist, PA-C 01/23/17 1946    Fatima Blank, MD 01/24/17 [phone]

## 2017-01-23 NOTE — ED Triage Notes (Signed)
Per EMS pt was at home and developed lower left sided chest pain.  Pt does have history of MI in November.  NAD noted at this time he did take 324 of Asprin and 1 nitro tablet. AOx4

## 2017-01-23 NOTE — H&P (Addendum)
[facility] History and Physical  Easten Maceachern SWN:[id number] DOB: [birth year] DOA: 01/23/2017  Referring physician:  PCP: No primary care provider on file.   Chief Complaint: "My chest was hurting."  HPI: Micael Barb is a 67 y.o. male  W/ pmhx of CAD, MI and anxiety. CP of left chest. No radiation. Pt does have chronic cough. Took 4 baby aspirins. 1 ntg from EMS. Came to ED.  ED Course: CXR clear. Trop neg x1. EKG no STEMI. Hospitalist consulted for admission.  Review of Systems:  As per HPI otherwise 10 point review of systems negative.    Past Medical History:  Diagnosis Date  . Anxiety   . Arthritis   . Coronary artery disease    10/18 PCI/DES to pLAD, mild nonobstructive disease in the Lcx/RCA. Normal EF.   Marland Kitchen Dysrhythmia 2016   irregular heartbeat  . Myocardial infarction ([place])   . Seasonal allergies    Past Surgical History:  Procedure Laterality Date  . CORONARY ANGIOPLASTY  10/09/2016  . CORONARY STENT INTERVENTION N/A 10/09/2016   Procedure: CORONARY STENT INTERVENTION;  Surgeon: Nelva Bush, MD;  Location: [place] [facility];  Service: Cardiovascular;  Laterality: N/A;  . INTRAVASCULAR ULTRASOUND/IVUS N/A 10/09/2016   Procedure: Intravascular Ultrasound/IVUS;  Surgeon: Nelva Bush, MD;  Location: [place] [facility];  Service: Cardiovascular;  Laterality: N/A;  . LEFT HEART CATH AND CORONARY ANGIOGRAPHY N/A 10/09/2016   Procedure: LEFT HEART CATH AND CORONARY ANGIOGRAPHY;  Surgeon: Nelva Bush, MD;  Location: [place] [facility];  Service: Cardiovascular;  Laterality: N/A;   Social History:  reports that he has been smoking cigarettes.  He started smoking about 56 years ago. He has a 90.00 pack-year smoking history. he has never used smokeless tobacco. He reports that he does not drink alcohol or use drugs.  No Known Allergies  Family History  Problem Relation Age of Onset  . Cancer Father      Prior to Admission medications   Medication Sig  Start Date End Date Taking? Authorizing Provider  aspirin 81 MG EC tablet Take 1 tablet (81 mg total) by mouth daily. 10/10/16  Yes Cheryln Manly, NP  atorvastatin (LIPITOR) 80 MG tablet Take 1 tablet (80 mg total) by mouth daily at 6 PM. 10/10/16  Yes Reino Bellis B, NP  hydrOXYzine (ATARAX/VISTARIL) 10 MG tablet Take 1 tablet (10 mg total) by mouth 3 (three) times daily as needed. Patient taking differently: Take 10 mg by mouth 3 (three) times daily as needed for itching.  03/24/16  Yes Norman Herrlich, MD  metoprolol tartrate (LOPRESSOR) 25 MG tablet Take 0.5 tablets (12.5 mg total) by mouth 2 (two) times daily. 10/10/16  Yes Cheryln Manly, NP  nitroGLYCERIN (NITROSTAT) 0.4 MG SL tablet Place 1 tablet (0.4 mg total) under the tongue every 5 (five) minutes x 3 doses as needed for chest pain. 10/10/16  Yes Cheryln Manly, NP  Phenylephrine-Ibuprofen (CONGESTION RELIEF PO) Take 1 tablet by mouth every 8 (eight) hours as needed (for congestion).   Yes [provider]  tamsulosin (FLOMAX) 0.4 MG CAPS capsule Take 2 capsules (0.8 mg total) by mouth daily. 02/11/16  Yes Ledell Noss, MD  ticagrelor (BRILINTA) 90 MG TABS tablet Take 1 tablet (90 mg total) by mouth 2 (two) times daily. 10/10/16  Yes Cheryln Manly, NP  traMADol (ULTRAM) 50 MG tablet Take 1 tablet (50 mg total) by mouth every 6 (six) hours as needed for severe pain. 08/08/16  Yes Hoffman,  Jessica Ratliff, DO  buPROPion (WELLBUTRIN SR) 150 MG 12 hr tablet Take 1 tablet (150 mg total) by mouth 2 (two) times daily. For the first 3 days take one tablet afterwards take 2 tablets daily Patient taking differently: Take 300 mg by mouth daily.  08/24/16 08/24/17  Kalman Shan Ratliff, DO  nicotine (NICODERM CQ - DOSED IN MG/24 HOURS) 21 mg/24hr patch Place 1 patch (21 mg total) onto the skin daily. Patient not taking: Reported on 01/23/2017 10/10/16 10/10/17  Cheryln Manly, NP  Skin Protectants, Misc. (EUCERIN) cream Apply  topically as needed for dry skin. Patient not taking: Reported on 01/23/2017 03/24/16   Norman Herrlich, MD  triamcinolone cream (KENALOG) 0.1 % Apply 1 application topically 2 (two) times daily. Patient not taking: Reported on 01/23/2017 04/20/16   Valinda Party, DO   Physical Exam: Vitals:   01/23/17 1845 01/23/17 1900 01/23/17 1915 01/23/17 1930  BP: 118/68 108/67 119/66 109/70  Pulse: 72 71 66 70  Resp: 11 13 13  (!) 21  Temp:      TempSrc:      SpO2: 95% 95% 96% 92%  Weight:      Height:        Wt Readings from Last 3 Encounters:  01/23/17 94.3 kg (208 lb)  11/06/16 95.6 kg (210 lb 12.8 oz)  10/10/16 91.8 kg (202 lb 6.1 oz)    General:  Appears calm and comfortable; A&Ox3 Eyes:  PERRL, EOMI, normal lids, iris ENT:  grossly normal hearing, lips & tongue Neck:  no LAD, masses or thyromegaly Cardiovascular:  RRR, no m/r/g. No LE edema.  Respiratory:  CTA bilaterally, no w/r/r. Normal respiratory effort. Abdomen:  soft, ntnd Skin:  no rash or induration seen on limited exam Musculoskeletal:  grossly normal tone BUE/BLE Psychiatric:  grossly normal mood and affect, speech fluent and appropriate Neurologic:  CN 2-12 grossly intact, moves all extremities in coordinated fashion.          Labs on Admission:  Basic Metabolic Panel: Recent Labs  Lab 01/23/17 1640  NA 136  K 4.1  CL 101  CO2 25  GLUCOSE 96  BUN 9  CREATININE 1.08  CALCIUM 9.0   Liver Function Tests: No results for input(s): AST, ALT, ALKPHOS, BILITOT, PROT, ALBUMIN in the last 168 hours. No results for input(s): LIPASE, AMYLASE in the last 168 hours. No results for input(s): AMMONIA in the last 168 hours. CBC: Recent Labs  Lab 01/23/17 1640  WBC 13.6*  HGB 16.1  HCT 46.6  MCV 84.7  PLT 168   Cardiac Enzymes: No results for input(s): CKTOTAL, CKMB, CKMBINDEX, TROPONINI in the last 168 hours.  BNP (last 3 results) No results for input(s): BNP in the last 8760 hours.  ProBNP (last 3  results) No results for input(s): PROBNP in the last 8760 hours.   Serum creatinine: 1.08 mg/dL 01/23/17 1640 Estimated creatinine clearance: 75 mL/min  CBG: No results for input(s): GLUCAP in the last 168 hours.  Radiological Exams on Admission: Dg Chest 2 View  Result Date: 01/23/2017 CLINICAL DATA:  Lower left-sided chest pain. History of MI in November. EXAM: CHEST  2 VIEW COMPARISON:  Chest x-ray dated 10/09/2016. FINDINGS: The heart size and mediastinal contours are within normal limits. Atherosclerotic changes noted at the aortic arch. Both lungs are clear. No pleural effusion or pneumothorax seen. The visualized skeletal structures are unremarkable. IMPRESSION: No active cardiopulmonary disease. No evidence of pneumonia or pulmonary edema. Aortic atherosclerosis Electronically Signed  By: Franki Cabot M.D.   On: 01/23/2017 17:18    EKG: Independently reviewed. NSR, no stemi.  Assessment/Plan Active Problems:   Chest pain   1) CP High risk per EDP - serial trop ordered, initial neg - prn EKG CP - prn moprhine CP - prn ntg cp - asa in ED and QD - ECHO ordered for AM - tele bed, cardiac monitoring - ambien for sleep prn - zofran prn for nausea  CAD Cont brilinta  Hypertension When necessary hydralazine 10 mg IV as needed for severe blood pressure Cont lopressor  BPH Cont flomax  Hyperlipidemia Continue statin  Sinusitis Augmentin Nasal saline Mucinex  Anxiety Cont wellbutrin  Code Status: FC  DVT Prophylaxis: heparin Family Communication: none available Disposition Plan: Pending Improvement  Status: obs tele  Elwin Mocha, MD Family Medicine [facility] [email or web address] Password [email or web address]

## 2017-01-23 NOTE — Progress Notes (Signed)
Paged for clinic patient admission. Last seen in our clinic in August of 2018. Patient informed me today that he has fired [facility] as his primary care physicians. Reports that he was unhappy with his care. He has been seeing an old PCP in [place], [place] for med refills and has an appointment to establish with a new PCP in the near future. Will remove IMTS as PCP.   Velna Ochs, M.D. - PGY2 Pager: [phone] 01/23/2017, 9:55 PM

## 2017-01-23 NOTE — ED Notes (Signed)
HEART HEALTHY TRAY ORDERED 

## 2017-01-24 ENCOUNTER — Ambulatory Visit (HOSPITAL_COMMUNITY): Payer: Medicare Other

## 2017-01-24 ENCOUNTER — Encounter (HOSPITAL_COMMUNITY): Payer: Self-pay

## 2017-01-24 ENCOUNTER — Other Ambulatory Visit: Payer: Self-pay

## 2017-01-24 ENCOUNTER — Observation Stay (HOSPITAL_BASED_OUTPATIENT_CLINIC_OR_DEPARTMENT_OTHER): Payer: Medicare Other

## 2017-01-24 ENCOUNTER — Other Ambulatory Visit (HOSPITAL_COMMUNITY): Payer: Medicare Other

## 2017-01-24 DIAGNOSIS — I351 Nonrheumatic aortic (valve) insufficiency: Secondary | ICD-10-CM | POA: Diagnosis not present

## 2017-01-24 DIAGNOSIS — E7800 Pure hypercholesterolemia, unspecified: Secondary | ICD-10-CM | POA: Diagnosis not present

## 2017-01-24 DIAGNOSIS — Z720 Tobacco use: Secondary | ICD-10-CM | POA: Diagnosis not present

## 2017-01-24 DIAGNOSIS — I252 Old myocardial infarction: Secondary | ICD-10-CM | POA: Diagnosis not present

## 2017-01-24 DIAGNOSIS — R079 Chest pain, unspecified: Secondary | ICD-10-CM | POA: Diagnosis not present

## 2017-01-24 LAB — CBC
HEMATOCRIT: 45 % (ref 39.0–52.0)
HEMOGLOBIN: 15.2 g/dL (ref 13.0–17.0)
MCH: 28.6 pg (ref 26.0–34.0)
MCHC: 33.8 g/dL (ref 30.0–36.0)
MCV: 84.6 fL (ref 78.0–100.0)
Platelets: 153 10*3/uL (ref 150–400)
RBC: 5.32 MIL/uL (ref 4.22–5.81)
RDW: 13.1 % (ref 11.5–15.5)
WBC: 10.5 10*3/uL (ref 4.0–10.5)

## 2017-01-24 LAB — DIFFERENTIAL
BASOS ABS: 0.1 10*3/uL (ref 0.0–0.1)
BASOS PCT: 1 %
Eosinophils Absolute: 0.4 10*3/uL (ref 0.0–0.7)
Eosinophils Relative: 4 %
LYMPHS PCT: 35 %
Lymphs Abs: 3.7 10*3/uL (ref 0.7–4.0)
Monocytes Absolute: 0.6 10*3/uL (ref 0.1–1.0)
Monocytes Relative: 6 %
NEUTROS ABS: 5.7 10*3/uL (ref 1.7–7.7)
NEUTROS PCT: 54 %

## 2017-01-24 LAB — TROPONIN I

## 2017-01-24 LAB — CREATININE, SERUM
Creatinine, Ser: 1.23 mg/dL (ref 0.61–1.24)
GFR, EST NON AFRICAN AMERICAN: 59 mL/min — AB (ref >60)

## 2017-01-24 LAB — ECHOCARDIOGRAM COMPLETE
HEIGHTINCHES: 73 in
WEIGHTICAEL: 3328 [oz_av]

## 2017-01-24 MED ORDER — BUPROPION HCL ER (SR) 150 MG PO TB12: 300.0000 mg | ORAL_TABLET | Freq: Every day | ORAL | Status: DC

## 2017-01-24 MED ORDER — SALINE SPRAY 0.65 % NA SOLN
1.0000 | NASAL | Status: DC | PRN
  Administered 2017-01-24: 1 via NASAL
  Filled 2017-01-24: qty 44

## 2017-01-24 MED ORDER — AMOXICILLIN-POT CLAVULANATE 875-125 MG PO TABS: 1.0000 | ORAL_TABLET | Freq: Two times a day (BID) | ORAL | 0 refills | Status: DC

## 2017-01-24 MED ORDER — NICOTINE 14 MG/24HR TD PT24
14.0000 mg | MEDICATED_PATCH | Freq: Every day | TRANSDERMAL | Status: DC
  Administered 2017-01-24: 14 mg via TRANSDERMAL
  Filled 2017-01-24: qty 1

## 2017-01-24 MED ORDER — AMOXICILLIN-POT CLAVULANATE 875-125 MG PO TABS
1.0000 | ORAL_TABLET | Freq: Two times a day (BID) | ORAL | Status: DC
  Administered 2017-01-24 (×2): 1 via ORAL
  Filled 2017-01-24 (×3): qty 1

## 2017-01-24 MED ORDER — NITROGLYCERIN 0.4 MG SL SUBL: 0.4000 mg | SUBLINGUAL_TABLET | SUBLINGUAL | 0 refills | Status: DC | PRN

## 2017-01-24 MED ORDER — GUAIFENESIN ER 600 MG PO TB12
600.0000 mg | ORAL_TABLET | Freq: Two times a day (BID) | ORAL | Status: DC
  Administered 2017-01-24 (×2): 600 mg via ORAL
  Filled 2017-01-24 (×2): qty 1

## 2017-01-24 NOTE — ED Notes (Signed)
Patient transported to Ultrasound for ECHO °

## 2017-01-24 NOTE — Consult Note (Signed)
Cardiology Consultation:   Patient ID: David Hendricks; [id number]; 1950-06-03   Admit date: 01/23/2017 Date of Consult: 01/24/2017  Primary Care Provider: No primary care provider on file. Primary Cardiologist: Dr Irish Lack   Patient Profile:   David Hendricks is a 67 y.o. male with a hx of CAD who is being seen today for the evaluation of chest pain at the request of Dr Eliseo Squires.  History of Present Illness:   [name] is a 67 y/o male with a history of CAD, s/p NSTEMI Oct 2018 treated with a  pLAD PCI/DES. Residual 50% LAD, 30% RCA, LVF was normal. He was seen in f/u in Oct, he was doing well but had resumed smoking. He was to see [place] last week but was "too sick" with a cough and rescheduled for Jan 22nd. Since we saw him last he "fired" his PCP so he was not taking any antibiotics. Yesterday he got up to walk his dog and became weak and dizzy. He then had coughing and chest "tightness" though it was unlike his pre PCI symptoms. He got scared and called EMS. His Troponin has been negative x 2 and he is currently pain free. He denies running out of any of his cardiac medications.   Past Medical History:  Diagnosis Date  . Anxiety   . Arthritis   . Coronary artery disease    10/18 PCI/DES to pLAD, mild nonobstructive disease in the Lcx/RCA. Normal EF.   Marland Kitchen Dysrhythmia 2016   irregular heartbeat  . Myocardial infarction ([place])   . Seasonal allergies     Past Surgical History:  Procedure Laterality Date  . CORONARY ANGIOPLASTY  10/09/2016  . CORONARY STENT INTERVENTION N/A 10/09/2016   Procedure: CORONARY STENT INTERVENTION;  Surgeon: Nelva Bush, MD;  Location: [facility];  Service: Cardiovascular;  Laterality: N/A;  . INTRAVASCULAR ULTRASOUND/IVUS N/A 10/09/2016   Procedure: Intravascular Ultrasound/IVUS;  Surgeon: Nelva Bush, MD;  Location: [facility];  Service: Cardiovascular;  Laterality: N/A;  . LEFT HEART CATH AND CORONARY ANGIOGRAPHY N/A 10/09/2016   Procedure: LEFT  HEART CATH AND CORONARY ANGIOGRAPHY;  Surgeon: Nelva Bush, MD;  Location: [facility];  Service: Cardiovascular;  Laterality: N/A;     Home Medications:  Prior to Admission medications   Medication Sig Start Date End Date Taking? Authorizing Provider  aspirin 81 MG EC tablet Take 1 tablet (81 mg total) by mouth daily. 10/10/16  Yes Cheryln Manly, NP  atorvastatin (LIPITOR) 80 MG tablet Take 1 tablet (80 mg total) by mouth daily at 6 PM. 10/10/16  Yes Reino Bellis B, NP  hydrOXYzine (ATARAX/VISTARIL) 10 MG tablet Take 1 tablet (10 mg total) by mouth 3 (three) times daily as needed. Patient taking differently: Take 10 mg by mouth 3 (three) times daily as needed for itching.  03/24/16  Yes Norman Herrlich, MD  metoprolol tartrate (LOPRESSOR) 25 MG tablet Take 0.5 tablets (12.5 mg total) by mouth 2 (two) times daily. 10/10/16  Yes Cheryln Manly, NP  nitroGLYCERIN (NITROSTAT) 0.4 MG SL tablet Place 1 tablet (0.4 mg total) under the tongue every 5 (five) minutes x 3 doses as needed for chest pain. 10/10/16  Yes Cheryln Manly, NP  Phenylephrine-Ibuprofen (CONGESTION RELIEF PO) Take 1 tablet by mouth every 8 (eight) hours as needed (for congestion).   Yes [provider]  tamsulosin (FLOMAX) 0.4 MG CAPS capsule Take 2 capsules (0.8 mg total) by mouth daily. 02/11/16  Yes Ledell Noss, MD  ticagrelor (BRILINTA) 90 MG TABS tablet Take 1 tablet (90 mg total) by mouth 2 (two) times daily. 10/10/16  Yes Cheryln Manly, NP  traMADol (ULTRAM) 50 MG tablet Take 1 tablet (50 mg total) by mouth every 6 (six) hours as needed for severe pain. 08/08/16  Yes Hoffman, Jessica Ratliff, DO  buPROPion (WELLBUTRIN SR) 150 MG 12 hr tablet Take 1 tablet (150 mg total) by mouth 2 (two) times daily. For the first 3 days take one tablet afterwards take 2 tablets daily Patient taking differently: Take 300 mg by mouth daily.  08/24/16 08/24/17  Kalman Shan Ratliff, DO  nicotine (NICODERM CQ -  DOSED IN MG/24 HOURS) 21 mg/24hr patch Place 1 patch (21 mg total) onto the skin daily. Patient not taking: Reported on 01/23/2017 10/10/16 10/10/17  Cheryln Manly, NP  Skin Protectants, Misc. (EUCERIN) cream Apply topically as needed for dry skin. Patient not taking: Reported on 01/23/2017 03/24/16   Norman Herrlich, MD  triamcinolone cream (KENALOG) 0.1 % Apply 1 application topically 2 (two) times daily. Patient not taking: Reported on 01/23/2017 04/20/16   Valinda Party, DO    Inpatient Medications: Scheduled Meds: . amoxicillin-clavulanate  1 tablet Oral Q12H  . aspirin EC  81 mg Oral Daily  . atorvastatin  80 mg Oral q1800  . buPROPion  300 mg Oral Daily  . guaiFENesin  600 mg Oral BID  . heparin  5,000 Units Subcutaneous Q8H  . metoprolol tartrate  12.5 mg Oral BID  . nicotine  14 mg Transdermal Daily  . tamsulosin  0.8 mg Oral Daily  . ticagrelor  90 mg Oral BID   Continuous Infusions:  PRN Meds: acetaminophen, hydrALAZINE, morphine injection, nitroGLYCERIN, ondansetron (ZOFRAN) IV, sodium chloride, zolpidem  Allergies:   No Known Allergies  Social History:   Social History   Socioeconomic History  . Marital status: Divorced    Spouse name: Not on file  . Number of children: Not on file  . Years of education: Not on file  . Highest education level: Not on file  Social Needs  . Financial resource strain: Not on file  . Food insecurity - worry: Not on file  . Food insecurity - inability: Not on file  . Transportation needs - medical: Not on file  . Transportation needs - non-medical: Not on file  Occupational History  . Not on file  Tobacco Use  . Smoking status: Current Every Day Smoker    Packs/day: 2.00    Years: 45.00    Pack years: 90.00    Types: Cigarettes    Start date: 01/09/1961  . Smokeless tobacco: Never Used  . Tobacco comment: thinking about after settling in   Substance and Sexual Activity  . Alcohol use: No  . Drug use: No  .  Sexual activity: Not on file  Other Topics Concern  . Not on file  Social History Narrative  . Not on file    Family History:    Family History  Problem Relation Age of Onset  . Cancer Father      ROS:  Please see the history of present illness.  ROS  All other ROS reviewed and negative.     Physical Exam/Data:   Vitals:   01/24/17 1224 01/24/17 1245 01/24/17 1300 01/24/17 1302  BP: 108/66  (!) 96/56   Pulse: (!) 59  62   Resp: 18  19   Temp:      TempSrc:  SpO2: 92% 93% 92% 92%  Weight:      Height:       No intake or output data in the 24 hours ending 01/24/17 1539 Filed Weights   01/23/17 1633  Weight: 208 lb (94.3 kg)   Body mass index is 27.44 kg/m.  General:  Well nourished, well developed, in no acute distress HEENT: normal, glasses Lymph: no adenopathy Neck: no JVD Endocrine:  No thryomegaly Vascular: No carotid bruits; FA pulses 2+ bilaterally without bruits  Cardiac:  normal S1, S2; RRR; no murmur  Lungs:  clear to auscultation bilaterally, no wheezing, rhonchi or rales  Abd: soft, nontender, no hepatomegaly  Ext: no edema Musculoskeletal:  No deformities, BUE and BLE strength normal and equal Skin: warm and dry  Neuro:  CNs 2-12 intact, no focal abnormalities noted Psych:  Normal affect   EKG:  The EKG was personally reviewed and demonstrates:  NSR, LAFB Telemetry:  Telemetry was personally reviewed and demonstrates:  NSR  Relevant CV Studies: Echo done today- results pending  Laboratory Data:  Chemistry Recent Labs  Lab 01/23/17 1640 01/24/17 0347  NA 136  --   K 4.1  --   CL 101  --   CO2 25  --   GLUCOSE 96  --   BUN 9  --   CREATININE 1.08 1.23  CALCIUM 9.0  --   GFRNONAA >60 59*  GFRAA >60 >60  ANIONGAP 10  --     No results for input(s): PROT, ALBUMIN, AST, ALT, ALKPHOS, BILITOT in the last 168 hours. Hematology Recent Labs  Lab 01/23/17 1640 01/24/17 0347  WBC 13.6* 10.5  RBC 5.50 5.32  HGB 16.1 15.2  HCT  46.6 45.0  MCV 84.7 84.6  MCH 29.3 28.6  MCHC 34.5 33.8  RDW 13.4 13.1  PLT 168 153   Cardiac Enzymes Recent Labs  Lab 01/24/17 0757 01/24/17 1400  TROPONINI <0.03 <0.03    Recent Labs  Lab 01/23/17 1653  TROPIPOC 0.00    BNPNo results for input(s): BNP, PROBNP in the last 168 hours.  DDimer No results for input(s): DDIMER in the last 168 hours.  Radiology/Studies:  Dg Chest 2 View  Result Date: 01/23/2017 CLINICAL DATA:  Lower left-sided chest pain. History of MI in November. EXAM: CHEST  2 VIEW COMPARISON:  Chest x-ray dated 10/09/2016. FINDINGS: The heart size and mediastinal contours are within normal limits. Atherosclerotic changes noted at the aortic arch. Both lungs are clear. No pleural effusion or pneumothorax seen. The visualized skeletal structures are unremarkable. IMPRESSION: No active cardiopulmonary disease. No evidence of pneumonia or pulmonary edema. Aortic atherosclerosis Electronically Signed   By: Franki Cabot M.D.   On: 01/23/2017 17:18    Assessment and Plan:   Chest pain- Unlike his pre PCI symptoms. Troponin negative x 2  CAD s/p PCI pLAD PCI with DES 10/09/16  Acute bronchitis Bronchitis by history prior to admission  HLD- On high dose statin Rx  Smoker  Plan: Will review with MD. He may be able to go home tonight without further work up or echo results and keep OP f/u on Jan 22nd. Will make sure he has an Rx for SL NTG, other medications can be filled at his OV.    For questions or updates, please contact [facility] Please consult [email or web address] for contact info under Cardiology/STEMI.   Signed, Kerin Ransom, PA-C  01/24/2017 3:39 PM   Personally seen and examined, agree with above  67 year old male with prior  proximal LAD PCI in October 2018 in the setting of myocardial infarction here with atypical chest pain after fits of coughing up with what he describes as left arm shaking uncontrollably.  He states that he was anxious.  No  syncope, bleeding, fevers, chills.  Exam: Mild wheeze heard bilaterally, normal respiratory effort, regular rate and rhythm, no murmurs, alert, no edema, moves all extremities  Labs: Troponins are normal.  EKG: Personally reviewed no T wave abnormalities.  No ischemic changes.  Normal sinus rhythm.  Assessment and plan:  Atypical chest pain/coronary artery disease/PCI to LAD in October 2018 -Likely associated with his cough, musculoskeletal/pulmonary related.  Currently on antibiotic per primary team.  Feeling better.  No further chest discomfort.  Cardiac catheterization previously reviewed, proximal LAD stent noted.  There are no changes on ECG, no troponin elevation.  This is not myocardial infarction or unstable angina.  Continue with treatment of his cough.  Of course he was anxious thinking that he may have a myocardial infarction.  Reassurance.  Tobacco use -Encourage cessation  Bronchitis -Per primary team.  Okay with discharge from cardiology perspective.  No further cardiac testing.  Candee Furbish, MD

## 2017-01-24 NOTE — Progress Notes (Signed)
  Echocardiogram 2D Echocardiogram has been performed.  Jennette Dubin 01/24/2017, 8:58 AM

## 2017-01-24 NOTE — ED Notes (Signed)
Back from ECHO

## 2017-01-24 NOTE — Progress Notes (Signed)
PROGRESS NOTE    David Hendricks  SEG:[id number] DOB: 1950/04/11 DOA: 01/23/2017 PCP: No primary care provider on file.   Outpatient Specialists:    Brief Narrative:  Recent cath and stent placement in October.  Patient reports being out of medications for 1-1 1/2 weeks.  He is a poor historian but thinks this includes brilenta.      Assessment & Plan:   Active Problems:   Chest pain   Chest pain -have ordered cardiac enzymes-- last one from 5PM on 1/15 was normal, EKG similar to prior -not NPO this AM and had a few pancakes at 8AM-- NPO until seen by cards -cardiology consult -echo pending  Tobacco abuse -encourage cessation  Sinus infection -augmentin   DVT prophylaxis:  SQ Heparin  Code Status: Full Code   Family Communication:   Disposition Plan:     Consultants:  cardiology  Subjective: Says he has been out of medications for 1- 1 1/2 weeks  Objective: Vitals:   01/24/17 0300 01/24/17 0400 01/24/17 0415 01/24/17 0701  BP: (!) 94/48 (!) 109/57  95/63  Pulse: 66 63 79 (!) 37  Resp: 16 17 (!) 21 18  Temp:      TempSrc:      SpO2: 93% 93% 97% 92%  Weight:      Height:       No intake or output data in the 24 hours ending 01/24/17 0835 Filed Weights   01/23/17 1633  Weight: 94.3 kg (208 lb)    Examination:  General exam: congested Respiratory system: Clear to auscultation. Respiratory effort normal. Cardiovascular system: S1 & S2 heard, RRR. No JVD, murmurs, rubs, gallops or clicks. No pedal edema. Gastrointestinal system: Abdomen is nondistended, soft and nontender. No organomegaly or masses felt. Normal bowel sounds heard. Central nervous system: Alert and oriented. No focal neurological deficits. Extremities: Symmetric 5 x 5 power. Skin: No rashes, lesions or ulcers Psychiatry: Judgement and insight appear normal. Mood & affect appropriate.     Data Reviewed: I have personally reviewed following labs and imaging studies  CBC: Recent  Labs  Lab 01/23/17 1640 01/24/17 0347  WBC 13.6* 10.5  NEUTROABS  --  5.7  HGB 16.1 15.2  HCT 46.6 45.0  MCV 84.7 84.6  PLT 168 737   Basic Metabolic Panel: Recent Labs  Lab 01/23/17 1640 01/24/17 0347  NA 136  --   K 4.1  --   CL 101  --   CO2 25  --   GLUCOSE 96  --   BUN 9  --   CREATININE 1.08 1.23  CALCIUM 9.0  --    GFR: Estimated Creatinine Clearance: 65.9 mL/min (by C-G formula based on SCr of 1.23 mg/dL). Liver Function Tests: No results for input(s): AST, ALT, ALKPHOS, BILITOT, PROT, ALBUMIN in the last 168 hours. No results for input(s): LIPASE, AMYLASE in the last 168 hours. No results for input(s): AMMONIA in the last 168 hours. Coagulation Profile: No results for input(s): INR, PROTIME in the last 168 hours. Cardiac Enzymes: No results for input(s): CKTOTAL, CKMB, CKMBINDEX, TROPONINI in the last 168 hours. BNP (last 3 results) No results for input(s): PROBNP in the last 8760 hours. HbA1C: No results for input(s): HGBA1C in the last 72 hours. CBG: No results for input(s): GLUCAP in the last 168 hours. Lipid Profile: No results for input(s): CHOL, HDL, LDLCALC, TRIG, CHOLHDL, LDLDIRECT in the last 72 hours. Thyroid Function Tests: No results for input(s): TSH, T4TOTAL, FREET4, T3FREE, THYROIDAB in the  last 72 hours. Anemia Panel: No results for input(s): VITAMINB12, FOLATE, FERRITIN, TIBC, IRON, RETICCTPCT in the last 72 hours. Urine analysis:    Component Value Date/Time   COLORURINE YELLOW 03/15/2016 2347   APPEARANCEUR Clear 08/08/2016 0900   LABSPEC 1.014 03/15/2016 2347   PHURINE 6.0 03/15/2016 2347   GLUCOSEU Negative 08/08/2016 0900   HGBUR NEGATIVE 03/15/2016 2347   BILIRUBINUR Negative 08/08/2016 0900   KETONESUR NEGATIVE 03/15/2016 2347   PROTEINUR Negative 08/08/2016 0900   PROTEINUR NEGATIVE 03/15/2016 2347   NITRITE Negative 08/08/2016 0900   NITRITE NEGATIVE 03/15/2016 2347   LEUKOCYTESUR Trace (A) 08/08/2016 0900     )No  results found for this or any previous visit (from the past 240 hour(s)).    Anti-infectives (From admission, onward)   Start     Dose/Rate Route Frequency Ordered Stop   01/24/17 0030  amoxicillin-clavulanate (AUGMENTIN) 875-125 MG per tablet 1 tablet     1 tablet Oral Every 12 hours 01/24/17 0016 02/01/17 2159       Radiology Studies: Dg Chest 2 View  Result Date: 01/23/2017 CLINICAL DATA:  Lower left-sided chest pain. History of MI in November. EXAM: CHEST  2 VIEW COMPARISON:  Chest x-ray dated 10/09/2016. FINDINGS: The heart size and mediastinal contours are within normal limits. Atherosclerotic changes noted at the aortic arch. Both lungs are clear. No pleural effusion or pneumothorax seen. The visualized skeletal structures are unremarkable. IMPRESSION: No active cardiopulmonary disease. No evidence of pneumonia or pulmonary edema. Aortic atherosclerosis Electronically Signed   By: Franki Cabot M.D.   On: 01/23/2017 17:18        Scheduled Meds: . amoxicillin-clavulanate  1 tablet Oral Q12H  . aspirin EC  81 mg Oral Daily  . atorvastatin  80 mg Oral q1800  . buPROPion  300 mg Oral Daily  . guaiFENesin  600 mg Oral BID  . heparin  5,000 Units Subcutaneous Q8H  . metoprolol tartrate  12.5 mg Oral BID  . nicotine  14 mg Transdermal Daily  . tamsulosin  0.8 mg Oral Daily  . ticagrelor  90 mg Oral BID   Continuous Infusions:   LOS: 0 days    Time spent: 35 min    Geradine Girt, DO [facility] Pager [phone]  If 7PM-7AM, please contact night-coverage [email or web address] Password [facility] 01/24/2017, 8:35 AM

## 2017-01-24 NOTE — Discharge Summary (Signed)
Physician Discharge Summary  David Hendricks WYO:[id number] DOB: March 25, 1950 DOA: 01/23/2017  PCP: No primary care provider on file.  Admit date: 01/23/2017 Discharge date: 01/24/2017   Recommendations for Outpatient Follow-Up:   1. Tobacco cessation 2. Close cardiology follow up   Discharge Diagnosis:   Active Problems:   Tobacco abuse   History of non-ST elevation myocardial infarction (NSTEMI)   Hyperlipidemia   Chest pain with moderate risk of acute coronary syndrome   Discharge disposition:  Home  Discharge Condition: Improved.  Diet recommendation: Low sodium, heart healthy  Wound care: None.   History of Present Illness:   Recent cath and stent placement in October.  Patient reports being out of medications for 1-1 1/2 weeks.  He is a poor historian but thinks this includes [place] Hospital Course by Problem:   Chest pain -CE negative -follow up with cardiology outpatient  Tobacco abuse -encourage cessation  Sinus infection -augmentin      Medical Consultants:    cards   Discharge Exam:   Vitals:   01/24/17 1300 01/24/17 1302  BP: (!) 96/56   Pulse: 62   Resp: 19   Temp:    SpO2: 92% 92%   Vitals:   01/24/17 1224 01/24/17 1245 01/24/17 1300 01/24/17 1302  BP: 108/66  (!) 96/56   Pulse: (!) 59  62   Resp: 18  19   Temp:      TempSrc:      SpO2: 92% 93% 92% 92%  Weight:      Height:        Gen:  NAD    The results of significant diagnostics from this hospitalization (including imaging, microbiology, ancillary and laboratory) are listed below for reference.     Procedures and Diagnostic Studies:   Dg Chest 2 View  Result Date: 01/23/2017 CLINICAL DATA:  Lower left-sided chest pain. History of MI in November. EXAM: CHEST  2 VIEW COMPARISON:  Chest x-ray dated 10/09/2016. FINDINGS: The heart size and mediastinal contours are within normal limits. Atherosclerotic changes noted at the aortic arch. Both lungs are clear. No  pleural effusion or pneumothorax seen. The visualized skeletal structures are unremarkable. IMPRESSION: No active cardiopulmonary disease. No evidence of pneumonia or pulmonary edema. Aortic atherosclerosis Electronically Signed   By: Franki Cabot M.D.   On: 01/23/2017 17:18     Labs:   Basic Metabolic Panel: Recent Labs  Lab 01/23/17 1640 01/24/17 0347  NA 136  --   K 4.1  --   CL 101  --   CO2 25  --   GLUCOSE 96  --   BUN 9  --   CREATININE 1.08 1.23  CALCIUM 9.0  --    GFR Estimated Creatinine Clearance: 65.9 mL/min (by C-G formula based on SCr of 1.23 mg/dL). Liver Function Tests: No results for input(s): AST, ALT, ALKPHOS, BILITOT, PROT, ALBUMIN in the last 168 hours. No results for input(s): LIPASE, AMYLASE in the last 168 hours. No results for input(s): AMMONIA in the last 168 hours. Coagulation profile No results for input(s): INR, PROTIME in the last 168 hours.  CBC: Recent Labs  Lab 01/23/17 1640 01/24/17 0347  WBC 13.6* 10.5  NEUTROABS  --  5.7  HGB 16.1 15.2  HCT 46.6 45.0  MCV 84.7 84.6  PLT 168 153   Cardiac Enzymes: Recent Labs  Lab 01/24/17 0757 01/24/17 1400  TROPONINI <0.03 <0.03   BNP: Invalid input(s): POCBNP CBG: No results for input(s): GLUCAP  in the last 168 hours. D-Dimer No results for input(s): DDIMER in the last 72 hours. Hgb A1c No results for input(s): HGBA1C in the last 72 hours. Lipid Profile No results for input(s): CHOL, HDL, LDLCALC, TRIG, CHOLHDL, LDLDIRECT in the last 72 hours. Thyroid function studies No results for input(s): TSH, T4TOTAL, T3FREE, THYROIDAB in the last 72 hours.  Invalid input(s): FREET3 Anemia work up No results for input(s): VITAMINB12, FOLATE, FERRITIN, TIBC, IRON, RETICCTPCT in the last 72 hours. Microbiology No results found for this or any previous visit (from the past 240 hour(s)).   Discharge Instructions:   Discharge Instructions    Diet - low sodium heart healthy   Complete by:  As  directed    Discharge instructions   Complete by:  As directed    Follow up with cardiology Stop smoking   Increase activity slowly   Complete by:  As directed      Allergies as of 01/24/2017   No Known Allergies     Medication List    STOP taking these medications   CONGESTION RELIEF PO   eucerin cream   triamcinolone cream 0.1 % Commonly known as:  KENALOG     TAKE these medications   amoxicillin-clavulanate 875-125 MG tablet Commonly known as:  AUGMENTIN Take 1 tablet by mouth every 12 (twelve) hours.   aspirin 81 MG EC tablet Take 1 tablet (81 mg total) by mouth daily.   atorvastatin 80 MG tablet Commonly known as:  LIPITOR Take 1 tablet (80 mg total) by mouth daily at 6 PM.   buPROPion 150 MG 12 hr tablet Commonly known as:  WELLBUTRIN SR Take 2 tablets (300 mg total) by mouth daily.   hydrOXYzine 10 MG tablet Commonly known as:  ATARAX/VISTARIL Take 1 tablet (10 mg total) by mouth 3 (three) times daily as needed. What changed:  reasons to take this   metoprolol tartrate 25 MG tablet Commonly known as:  LOPRESSOR Take 0.5 tablets (12.5 mg total) by mouth 2 (two) times daily.   nicotine 21 mg/24hr patch Commonly known as:  NICODERM CQ - dosed in mg/24 hours Place 1 patch (21 mg total) onto the skin daily.   nitroGLYCERIN 0.4 MG SL tablet Commonly known as:  NITROSTAT Place 1 tablet (0.4 mg total) under the tongue every 5 (five) minutes x 3 doses as needed for chest pain.   tamsulosin 0.4 MG Caps capsule Commonly known as:  FLOMAX Take 2 capsules (0.8 mg total) by mouth daily.   ticagrelor 90 MG Tabs tablet Commonly known as:  BRILINTA Take 1 tablet (90 mg total) by mouth 2 (two) times daily.   traMADol 50 MG tablet Commonly known as:  ULTRAM Take 1 tablet (50 mg total) by mouth every 6 (six) hours as needed for severe pain.      Follow-up Information    [name], MD Follow up on 01/30/2017.   Specialties:  Cardiology, Radiology,  Interventional Cardiology Why:  2 pm Contact information: [address] [address] [phone]        PCP 1 week Follow up.            Time coordinating discharge: 35 min  Signed:  Geradine Girt   [facility] 01/24/2017, 5:00 PM

## 2017-01-24 NOTE — Progress Notes (Signed)
David Hendricks is a 67 y.o. male patient admitted from [place] awake, alert - oriented  X 4 - no acute distress noted.  VSS - Blood pressure (!) 96/56, pulse 62, temperature 98.3 F (36.8 C), temperature source Oral, resp. rate 19, height 6\' 1"  (1.854 m), weight 94.3 kg (208 lb), SpO2 92 %.    IV in place, occlusive dsg intact without redness.  Orientation to room, and floor completed with information packet given to patient/family.  Patient declined safety video at this time.  Admission INP armband ID verified with patient/family, and in place.   SR up x 2, fall assessment complete, with patient and family able to verbalize understanding of risk associated with falls, and verbalized understanding to call nsg before up out of bed.  Call light within reach, patient able to voice, and demonstrate understanding.  Skin, clean-dry- intact without evidence of bruising, or skin tears.   No evidence of skin break down noted on exam.     Will cont to eval and treat per MD orders.  Milas Hock, RN 01/24/2017 2:38 PM

## 2017-01-26 ENCOUNTER — Ambulatory Visit (HOSPITAL_COMMUNITY): Payer: Medicare Other

## 2017-01-29 ENCOUNTER — Ambulatory Visit (HOSPITAL_COMMUNITY): Payer: Medicare Other

## 2017-01-29 NOTE — Progress Notes (Signed)
Cardiology Office Note   Date:  01/30/2017   ID:  David Hendricks, DOB 1950/10/18, MRN [id number]  PCP:  Patient, No Pcp Per    No chief complaint on file. CAD   Wt Readings from Last 3 Encounters:  01/30/17 212 lb (96.2 kg)  01/23/17 208 lb (94.3 kg)  11/06/16 210 lb 12.8 oz (95.6 kg)       History of Present Illness: David Hendricks is a 67 y.o. male   with a history of tobacco abuse. In 10/18,He had intermittent chest discomfort which he described as a pressure, that lasted anywhere from 30-45 minutes. He thought it was gas. He had more discomfort a few hours later. It moved to his left arm and left side of his face. It persisted even while he was at rest. He came to the ER via EMS.  Initial POC troponin was positive. A lab troponin was also positive. He had some relief of his chest discomfort with nitroglycerin.  Cath showed: 1. Severe single-vessel coronary artery disease with serial 50% ostial and 90% proximal/mid LAD stenoses. 2. Mild, nonobstructive CAD involving the LCx and RCA. 3. Subtle mid and apical anterior hypokinesis with otherwise preserved left ventricular contraction; LVEF low normal at 50-55%. 4. Upper normal left ventricular filling pressure. 5. Successful IVUS guided PCI to the proximal/mid LAD with placement of a Resolute Onyx 3.5 x 22 mm drug-eluting stent, post dilated proximally with a 4.0 mm [place] balloon. The first diagonal branch was jailed by the stent but demonstrates only mild ostial stenosis post PCI with TIMI-3 flow.  Recommendations: 1. Dual antiplatelet therapy with aspirin and ticagrelor for at least 12 months, ideally longer. 2. Aggressive secondary prevention, including high intensity statin therapy and smoking cessation.  He was admitted with chest pain in Jan 2019.  Echo showed :  Left ventricle: The cavity size was normal. Systolic function was   normal. The estimated ejection fraction was in the range of 55%   to 60%. Wall motion was  normal; there were no regional wall   motion abnormalities. Doppler parameters are consistent with   abnormal left ventricular relaxation (grade 1 diastolic   dysfunction). There was no evidence of elevated ventricular   filling pressure by Doppler parameters. - Aortic valve: There was mild regurgitation. - Mitral valve: There was no regurgitation. - Right ventricle: The cavity size was moderately dilated. Wall   thickness was normal. Systolic function was normal. - Tricuspid valve: There was moderate regurgitation. - Pulmonary arteries: Systolic pressure was mildly increased. PA   peak pressure: 32 mm Hg (S). - Inferior vena cava: The vessel was normal in size. - Pericardium, extracardiac: There was no pericardial effusion.   He was admitted in Jan 2019 with chest pain after coughing.  He ruled out for MI.  There was some question of compliance with medication.    Since leaving the hospital, Denies : Chest pain. Dizziness. Leg edema. Nitroglycerin use. Orthopnea. Palpitations. Paroxysmal nocturnal dyspnea. Shortness of breath. Syncope.   He did not do cardiac rehab.  He is walking some for exercise.  He wants stop smoking.  He still smokes 2 packs /day.  He reports some GERD sx when he lies down.  Relieved with vomiting.      Past Medical History:  Diagnosis Date  . Anxiety   . Arthritis   . Coronary artery disease    10/18 PCI/DES to pLAD, mild nonobstructive disease in the Lcx/RCA. Normal EF.   Marland Kitchen Dysrhythmia 2016  irregular heartbeat  . Myocardial infarction ([place])   . Seasonal allergies     Past Surgical History:  Procedure Laterality Date  . CORONARY ANGIOPLASTY  10/09/2016  . CORONARY STENT INTERVENTION N/A 10/09/2016   Procedure: CORONARY STENT INTERVENTION;  Surgeon: Nelva Bush, MD;  Location: [facility];  Service: Cardiovascular;  Laterality: N/A;  . INTRAVASCULAR ULTRASOUND/IVUS N/A 10/09/2016   Procedure: Intravascular Ultrasound/IVUS;  Surgeon:  Nelva Bush, MD;  Location: [facility];  Service: Cardiovascular;  Laterality: N/A;  . LEFT HEART CATH AND CORONARY ANGIOGRAPHY N/A 10/09/2016   Procedure: LEFT HEART CATH AND CORONARY ANGIOGRAPHY;  Surgeon: Nelva Bush, MD;  Location: [facility];  Service: Cardiovascular;  Laterality: N/A;     Current Outpatient Medications  Medication Sig Dispense Refill  . amoxicillin-clavulanate (AUGMENTIN) 875-125 MG tablet Take 1 tablet by mouth every 12 (twelve) hours. 12 tablet 0  . aspirin 81 MG EC tablet Take 1 tablet (81 mg total) by mouth daily.    Marland Kitchen atorvastatin (LIPITOR) 80 MG tablet Take 1 tablet (80 mg total) by mouth daily at 6 PM. 90 tablet 1  . buPROPion (WELLBUTRIN SR) 150 MG 12 hr tablet Take 2 tablets (300 mg total) by mouth daily.    . hydrOXYzine (ATARAX/VISTARIL) 10 MG tablet Take 1 tablet (10 mg total) by mouth 3 (three) times daily as needed. (Patient taking differently: Take 10 mg by mouth 3 (three) times daily as needed for itching. ) 30 tablet 0  . metoprolol tartrate (LOPRESSOR) 25 MG tablet Take 0.5 tablets (12.5 mg total) by mouth 2 (two) times daily. 60 tablet 1  . nitroGLYCERIN (NITROSTAT) 0.4 MG SL tablet Place 1 tablet (0.4 mg total) under the tongue every 5 (five) minutes x 3 doses as needed for chest pain. 25 tablet 0  . tamsulosin (FLOMAX) 0.4 MG CAPS capsule Take 2 capsules (0.8 mg total) by mouth daily. 30 capsule 3  . ticagrelor (BRILINTA) 90 MG TABS tablet Take 1 tablet (90 mg total) by mouth 2 (two) times daily. 180 tablet 2  . traMADol (ULTRAM) 50 MG tablet Take 1 tablet (50 mg total) by mouth every 6 (six) hours as needed for severe pain. 10 tablet 0   No current facility-administered medications for this visit.     Allergies:   Patient has no known allergies.    Social History:  The patient  reports that he has been smoking cigarettes.  He started smoking about 56 years ago. He has a 90.00 pack-year smoking history. he has never used  smokeless tobacco. He reports that he does not drink alcohol or use drugs.   Family History:  The patient's family history includes Cancer in his father.    ROS:  Please see the history of present illness.   Otherwise, review of systems are positive for rash.   All other systems are reviewed and negative.    PHYSICAL EXAM: VS:  BP (!) 100/50   Pulse 76   Ht 6\' 1"  (1.854 m)   Wt 212 lb (96.2 kg)   SpO2 96%   BMI 27.97 kg/m  , BMI Body mass index is 27.97 kg/m. GEN: Well nourished, well developed, in no acute distress  HEENT: normal  Neck: no JVD, carotid bruits, or masses Cardiac: RRR; no murmurs, rubs, or gallops,no edema  Respiratory:  clear to auscultation bilaterally, normal work of breathing GI: soft, nontender, nondistended, + BS MS: no deformity or atrophy  Skin: warm and dry, ; macular  rash on legs Neuro:  Strength and sensation are intact Psych: euthymic mood, full affect   EKG:   The ekg ordered in 1/19 demonstrates NSR, no ST changes   Recent Labs: 12/04/2016: ALT 15 01/23/2017: BUN 9; Potassium 4.1; Sodium 136 01/24/2017: Creatinine, Ser 1.23; Hemoglobin 15.2; Platelets 153   Lipid Panel    Component Value Date/Time   CHOL 106 12/04/2016 1029   TRIG 113 12/04/2016 1029   HDL 45 12/04/2016 1029   CHOLHDL 2.4 12/04/2016 1029   CHOLHDL 5.0 10/09/2016 2009   VLDL 20 10/09/2016 2009   LDLCALC 38 12/04/2016 1029     Other studies Reviewed: Additional studies/ records that were reviewed today with results demonstrating: .   ASSESSMENT AND PLAN:  1. CAD: No angina on medical therapy since leaving the hospital.  COntinue aggressive secondary prevention. 2. Tobacco abuse: Will try nicotine patches, 21 mg.  He needs to stop smoking. 3. Hyperlipidemia: LDL 38 in 11/18.  Continue lipid-lowering therapy. 4. 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  He will start cardiac rehab.    Current medicines are reviewed at length with the patient today.  The patient concerns regarding his  medicines were addressed.  The following changes have been made: Restart nicotine patches  Labs/ tests ordered today include:  No orders of the defined types were placed in this encounter.   Recommend 150 minutes/week of aerobic exercise Low fat, low carb, high fiber diet recommended  Disposition:   FU in 6 months   Signed, Larae Grooms, MD  01/30/2017 2:08 PM    [facility] [address], [place], [place]  [address] Phone: [phone]; Fax: [phone]

## 2017-01-30 ENCOUNTER — Ambulatory Visit (INDEPENDENT_AMBULATORY_CARE_PROVIDER_SITE_OTHER): Payer: Medicare Other | Admitting: Interventional Cardiology

## 2017-01-30 ENCOUNTER — Encounter: Payer: Self-pay | Admitting: Interventional Cardiology

## 2017-01-30 VITALS — BP 100/50 | HR 76 | Ht 73.0 in | Wt 212.0 lb

## 2017-01-30 DIAGNOSIS — I25118 Atherosclerotic heart disease of native coronary artery with other forms of angina pectoris: Secondary | ICD-10-CM | POA: Diagnosis not present

## 2017-01-30 DIAGNOSIS — I252 Old myocardial infarction: Secondary | ICD-10-CM | POA: Diagnosis not present

## 2017-01-30 DIAGNOSIS — Z720 Tobacco use: Secondary | ICD-10-CM

## 2017-01-30 DIAGNOSIS — E7800 Pure hypercholesterolemia, unspecified: Secondary | ICD-10-CM | POA: Diagnosis not present

## 2017-01-30 DIAGNOSIS — I2510 Atherosclerotic heart disease of native coronary artery without angina pectoris: Secondary | ICD-10-CM | POA: Insufficient documentation

## 2017-01-30 MED ORDER — NICOTINE 21 MG/24HR TD PT24: 21.0000 mg | MEDICATED_PATCH | TRANSDERMAL | 0 refills | Status: DC

## 2017-01-30 MED ORDER — METOPROLOL TARTRATE 25 MG PO TABS: 12.5000 mg | ORAL_TABLET | Freq: Two times a day (BID) | ORAL | 11 refills | Status: DC

## 2017-01-30 NOTE — Patient Instructions (Addendum)
Medication Instructions:  Your physician recommends that you continue on your current medications as directed. Please refer to the Current Medication list given to you today.  A prescription was sent to your pharmacy for Nicoderm 21 mg patch: Use as directed  Labwork: None ordered  Testing/Procedures: None ordered  Follow-Up: Your physician wants you to follow-up in: 6 months with Dr. Irish Lack. You will receive a reminder letter in the mail two months in advance. If you don't receive a letter, please call our office to schedule the follow-up appointment.   Any Other Special Instructions Will Be Listed Below (If Applicable).   Heart-Healthy Eating Plan Heart-healthy meal planning includes:  Limiting unhealthy fats.  Increasing healthy fats.  Making other small dietary changes.  You may need to talk with your doctor or a diet specialist (dietitian) to create an eating plan that is right for you. What types of fat should I choose?  Choose healthy fats. These include olive oil and canola oil, flaxseeds, walnuts, almonds, and seeds.  Eat more omega-3 fats. These include salmon, mackerel, sardines, tuna, flaxseed oil, and ground flaxseeds. Try to eat fish at least twice each week.  Limit saturated fats. ? Saturated fats are often found in animal products, such as meats, butter, and cream. ? Plant sources of saturated fats include palm oil, palm kernel oil, and coconut oil.  Avoid foods with partially hydrogenated oils in them. These include stick margarine, some tub margarines, cookies, crackers, and other baked goods. These contain trans fats. What general guidelines do I need to follow?  Check food labels carefully. Identify foods with trans fats or high amounts of saturated fat.  Fill one half of your plate with vegetables and green salads. Eat 4-5 servings of vegetables per day. A serving of vegetables is: ? 1 cup of raw leafy vegetables. ?  cup of raw or cooked cut-up  vegetables. ?  cup of vegetable juice.  Fill one fourth of your plate with whole grains. Look for the word "whole" as the first word in the ingredient list.  Fill one fourth of your plate with lean protein foods.  Eat 4-5 servings of fruit per day. A serving of fruit is: ? One medium whole fruit. ?  cup of dried fruit. ?  cup of fresh, frozen, or canned fruit. ?  cup of 100% fruit juice.  Eat more foods that contain soluble fiber. These include apples, broccoli, carrots, beans, peas, and barley. Try to get 20-30 g of fiber per day.  Eat more home-cooked food. Eat less restaurant, buffet, and fast food.  Limit or avoid alcohol.  Limit foods high in starch and sugar.  Avoid fried foods.  Avoid frying your food. Try baking, boiling, grilling, or broiling it instead. You can also reduce fat by: ? Removing the skin from poultry. ? Removing all visible fats from meats. ? Skimming the fat off of stews, soups, and gravies before serving them. ? Steaming vegetables in water or broth.  Lose weight if you are overweight.  Eat 4-5 servings of nuts, legumes, and seeds per week: ? One serving of dried beans or legumes equals  cup after being cooked. ? One serving of nuts equals 1 ounces. ? One serving of seeds equals  ounce or one tablespoon.  You may need to keep track of how much salt or sodium you eat. This is especially true if you have high blood pressure. Talk with your doctor or dietitian to get more information. What foods can  I eat? Grains Breads, including [place], white, pita, wheat, raisin, rye, oatmeal, and [place]. Tortillas that are neither fried nor made with lard or trans fat. Low-fat rolls, including hotdog and hamburger buns and English muffins. Biscuits. Muffins. Waffles. Pancakes. Light popcorn. Whole-grain cereals. Flatbread. Melba toast. Pretzels. Breadsticks. Rusks. Low-fat snacks. Low-fat crackers, including oyster, saltine, matzo, graham, animal, and rye. Rice  and pasta, including brown rice and pastas that are made with whole wheat. Vegetables All vegetables. Fruits All fruits, but limit coconut. Meats and Other Protein Sources Lean, well-trimmed beef, veal, pork, and lamb. Chicken and [place] without skin. All fish and shellfish. Wild duck, rabbit, pheasant, and venison. Egg whites or low-cholesterol egg substitutes. Dried beans, peas, lentils, and tofu. Seeds and most nuts. Dairy Low-fat or nonfat cheeses, including ricotta, string, and mozzarella. Skim or 1% milk that is liquid, powdered, or evaporated. Buttermilk that is made with low-fat milk. Nonfat or low-fat yogurt. Beverages Mineral water. Diet carbonated beverages. Sweets and Desserts Sherbets and fruit ices. Honey, jam, marmalade, jelly, and syrups. Meringues and gelatins. Pure sugar candy, such as hard candy, jelly beans, gumdrops, mints, marshmallows, and small amounts of dark chocolate. [employer] Eat all sweets and desserts in moderation. Fats and Oils Nonhydrogenated (trans-free) margarines. Vegetable oils, including soybean, sesame, sunflower, olive, peanut, safflower, corn, canola, and cottonseed. Salad dressings or mayonnaise made with a vegetable oil. Limit added fats and oils that you use for cooking, baking, salads, and as spreads. Other Cocoa powder. Coffee and tea. All seasonings and condiments. The items listed above may not be a complete list of recommended foods or beverages. Contact your dietitian for more options. What foods are not recommended? Grains Breads that are made with saturated or trans fats, oils, or whole milk. Croissants. Butter rolls. Cheese breads. Sweet rolls. Donuts. Buttered popcorn. Chow mein noodles. High-fat crackers, such as cheese or butter crackers. Meats and Other Protein Sources Fatty meats, such as hotdogs, short ribs, sausage, spareribs, bacon, rib eye roast or steak, and mutton. High-fat deli meats, such as salami and bologna. Caviar.  Domestic duck and goose. Organ meats, such as kidney, liver, sweetbreads, and heart. Dairy Cream, sour cream, cream cheese, and creamed cottage cheese. Whole-milk cheeses, including blue (bleu), Monte[place] Jack, [place], [place], American, [place], Swiss, cheddar, [place], and [place]. Whole or 2% milk that is liquid, evaporated, or condensed. Whole buttermilk. Cream sauce or high-fat cheese sauce. Yogurt that is made from whole milk. Beverages Regular sodas and juice drinks with added sugar. Sweets and Desserts Frosting. Pudding. Cookies. Cakes other than angel food cake. Candy that has milk chocolate or white chocolate, hydrogenated fat, butter, coconut, or unknown ingredients. Buttered syrups. Full-fat ice cream or ice cream drinks. Fats and Oils Gravy that has suet, meat fat, or shortening. Cocoa butter, hydrogenated oils, palm oil, coconut oil, palm kernel oil. These can often be found in baked products, candy, fried foods, nondairy creamers, and whipped toppings. Solid fats and shortenings, including bacon fat, salt pork, lard, and butter. Nondairy cream substitutes, such as coffee creamers and sour cream substitutes. Salad dressings that are made of unknown oils, cheese, or sour cream. The items listed above may not be a complete list of foods and beverages to avoid. Contact your dietitian for more information. This information is not intended to replace advice given to you by your health care provider. Make sure you discuss any questions you have with your health care provider. Document Released: 06/27/2011 Document Revised: 06/03/2015 Document Reviewed: 06/19/2013 Elsevier Interactive  Patient Education  [employer].    If you need a refill on your cardiac medications before your next appointment, please call your pharmacy.

## 2017-01-31 ENCOUNTER — Ambulatory Visit (HOSPITAL_COMMUNITY): Payer: Medicare Other

## 2017-02-02 ENCOUNTER — Ambulatory Visit (HOSPITAL_COMMUNITY): Payer: Medicare Other

## 2017-02-05 ENCOUNTER — Ambulatory Visit (HOSPITAL_COMMUNITY): Payer: Medicare Other

## 2017-02-07 ENCOUNTER — Ambulatory Visit (HOSPITAL_COMMUNITY): Payer: Medicare Other

## 2017-02-07 ENCOUNTER — Other Ambulatory Visit: Payer: Medicare Other

## 2017-02-09 ENCOUNTER — Ambulatory Visit (HOSPITAL_COMMUNITY): Payer: Medicare Other

## 2017-02-12 ENCOUNTER — Ambulatory Visit (HOSPITAL_COMMUNITY): Payer: Medicare Other

## 2017-02-14 ENCOUNTER — Ambulatory Visit (HOSPITAL_COMMUNITY): Payer: Medicare Other

## 2017-02-15 DIAGNOSIS — G4709 Other insomnia: Secondary | ICD-10-CM | POA: Diagnosis not present

## 2017-02-15 DIAGNOSIS — Z6827 Body mass index (BMI) 27.0-27.9, adult: Secondary | ICD-10-CM | POA: Diagnosis not present

## 2017-02-15 DIAGNOSIS — R21 Rash and other nonspecific skin eruption: Secondary | ICD-10-CM | POA: Diagnosis not present

## 2017-02-15 DIAGNOSIS — R05 Cough: Secondary | ICD-10-CM | POA: Diagnosis not present

## 2017-02-15 DIAGNOSIS — M545 Low back pain: Secondary | ICD-10-CM | POA: Diagnosis not present

## 2017-02-15 DIAGNOSIS — I2510 Atherosclerotic heart disease of native coronary artery without angina pectoris: Secondary | ICD-10-CM | POA: Diagnosis not present

## 2017-02-15 DIAGNOSIS — F17200 Nicotine dependence, unspecified, uncomplicated: Secondary | ICD-10-CM | POA: Diagnosis not present

## 2017-02-15 DIAGNOSIS — Z1389 Encounter for screening for other disorder: Secondary | ICD-10-CM | POA: Diagnosis not present

## 2017-02-15 DIAGNOSIS — M25511 Pain in right shoulder: Secondary | ICD-10-CM | POA: Diagnosis not present

## 2017-02-16 ENCOUNTER — Ambulatory Visit (HOSPITAL_COMMUNITY): Payer: Medicare Other

## 2017-02-19 ENCOUNTER — Ambulatory Visit (HOSPITAL_COMMUNITY): Payer: Medicare Other

## 2017-02-21 ENCOUNTER — Ambulatory Visit (HOSPITAL_COMMUNITY): Payer: Medicare Other

## 2017-02-23 ENCOUNTER — Ambulatory Visit (HOSPITAL_COMMUNITY): Payer: Medicare Other

## 2017-02-26 ENCOUNTER — Ambulatory Visit (HOSPITAL_COMMUNITY): Payer: Medicare Other

## 2017-02-27 ENCOUNTER — Encounter: Payer: Self-pay | Admitting: Neurology

## 2017-02-27 ENCOUNTER — Ambulatory Visit (INDEPENDENT_AMBULATORY_CARE_PROVIDER_SITE_OTHER): Payer: Medicare Other | Admitting: Neurology

## 2017-02-27 VITALS — BP 115/68 | HR 78 | Ht 73.0 in | Wt 210.5 lb

## 2017-02-27 DIAGNOSIS — Z955 Presence of coronary angioplasty implant and graft: Secondary | ICD-10-CM | POA: Diagnosis not present

## 2017-02-27 DIAGNOSIS — I25118 Atherosclerotic heart disease of native coronary artery with other forms of angina pectoris: Secondary | ICD-10-CM | POA: Diagnosis not present

## 2017-02-27 DIAGNOSIS — G478 Other sleep disorders: Secondary | ICD-10-CM | POA: Diagnosis not present

## 2017-02-27 DIAGNOSIS — R05 Cough: Secondary | ICD-10-CM

## 2017-02-27 DIAGNOSIS — F17200 Nicotine dependence, unspecified, uncomplicated: Secondary | ICD-10-CM

## 2017-02-27 DIAGNOSIS — I252 Old myocardial infarction: Secondary | ICD-10-CM | POA: Diagnosis not present

## 2017-02-27 DIAGNOSIS — R058 Other specified cough: Secondary | ICD-10-CM

## 2017-02-27 DIAGNOSIS — R351 Nocturia: Secondary | ICD-10-CM

## 2017-02-27 NOTE — Patient Instructions (Signed)

## 2017-02-27 NOTE — Progress Notes (Signed)
Subjective:    Patient ID: David Hendricks is a 67 y.o. male.  HPI     Star Age, MD, PhD [facility] [address], [address] [place], [place] [address]  Dear Dr. Philip Aspen,   I saw your patient, David Hendricks, upon your kind request in my neuro coronary artery disease logic clinic today for initial consultation of his sleep disorder, in particular, concern for underlying obstructive sleep apnea in the context of feeling tired, chronic difficulty with sleep maintenance, nonrestorative sleep. The patient is unaccompanied today. As you know, David Hendricks is a 67 year old right-handed gentleman with an underlying medical history of smoking, reflux disease, coronary artery disease with status post stent placement in October 2018, history of MI, seasonal allergies, anxiety, arthritis, and overweight state, who reports coughing at night and sinus drainage, and chronic difficulty with sleep. I reviewed your office note from 02/15/2017, which you kindly included. His Epworth sleepiness score is 0 out of 24, fatigue score is 46 out of 63. He is retired. He lives alone, he has 1 dog. He smokes about 2 packs per day, but has a Rx for Chantix, which he will start soon. He does not utilize alcohol, he drinks caffeine in the form of coffee, 2 cups in AM, tea throughout the day, one pitcher maybe. He tries to drink at least 3 bottles of water per day. Sleep is interrupted and going to sleep is difficult, uses an OTC sleep aid for the past 3 weeks, Simple Sleep.  This has helped a little bit. He goes to bed around 10 but checks his email on his eye pad and also watches movies on his eye pad, he typically is asleep between 11 or 11:30. He does not typically watch TV in his bedroom. His rise time is around 7:30 but depending on how late he fell asleep he may get out of bed around 10. He has nocturia about once per average night, denies morning headaches, has had the occasional restless leg  symptoms. Of note, he was hospitalized in January for chest pain. He is not aware of any family history of OSA. He is divorced, he has no children. He used to work as a Engineer, structural for about 15 years.  His Past Medical History Is Significant For: Past Medical History:  Diagnosis Date  . Anxiety   . Arthritis   . Coronary artery disease    10/18 PCI/DES to pLAD, mild nonobstructive disease in the Lcx/RCA. Normal EF.   Marland Kitchen Dysrhythmia 2016   irregular heartbeat  . Myocardial infarction ([place])   . Seasonal allergies     His Past Surgical History Is Significant For: Past Surgical History:  Procedure Laterality Date  . CORONARY ANGIOPLASTY  10/09/2016  . CORONARY STENT INTERVENTION N/A 10/09/2016   Procedure: CORONARY STENT INTERVENTION;  Surgeon: Nelva Bush, MD;  Location: [facility];  Service: Cardiovascular;  Laterality: N/A;  . INTRAVASCULAR ULTRASOUND/IVUS N/A 10/09/2016   Procedure: Intravascular Ultrasound/IVUS;  Surgeon: Nelva Bush, MD;  Location: [facility];  Service: Cardiovascular;  Laterality: N/A;  . LEFT HEART CATH AND CORONARY ANGIOGRAPHY N/A 10/09/2016   Procedure: LEFT HEART CATH AND CORONARY ANGIOGRAPHY;  Surgeon: Nelva Bush, MD;  Location: [facility];  Service: Cardiovascular;  Laterality: N/A;    His Family History Is Significant For: Family History  Problem Relation Age of Onset  . Cancer Father     His Social History Is Significant For: Social History  Socioeconomic History  . Marital status: Divorced    Spouse name: None  . Number of children: None  . Years of education: None  . Highest education level: None  Social Needs  . Financial resource strain: None  . Food insecurity - worry: None  . Food insecurity - inability: None  . Transportation needs - medical: None  . Transportation needs - non-medical: None  Occupational History  . None  Tobacco Use  . Smoking status: Current Every Day Smoker    Packs/day:  2.00    Years: 45.00    Pack years: 90.00    Types: Cigarettes    Start date: 01/09/1961  . Smokeless tobacco: Never Used  . Tobacco comment: thinking about after settling in   Substance and Sexual Activity  . Alcohol use: No  . Drug use: No  . Sexual activity: None  Other Topics Concern  . None  Social History Narrative  . None    His Allergies Are:  No Known Allergies:   His Current Medications Are:  Outpatient Encounter Medications as of 02/27/2017  Medication Sig  . amoxicillin-clavulanate (AUGMENTIN) 875-125 MG tablet Take 1 tablet by mouth every 12 (twelve) hours.  Marland Kitchen aspirin 81 MG EC tablet Take 1 tablet (81 mg total) by mouth daily.  Marland Kitchen atorvastatin (LIPITOR) 80 MG tablet Take 1 tablet (80 mg total) by mouth daily at 6 PM.  . buPROPion (WELLBUTRIN SR) 150 MG 12 hr tablet Take 2 tablets (300 mg total) by mouth daily.  . hydrOXYzine (ATARAX/VISTARIL) 10 MG tablet Take 1 tablet (10 mg total) by mouth 3 (three) times daily as needed. (Patient taking differently: Take 10 mg by mouth 3 (three) times daily as needed for itching. )  . metoprolol tartrate (LOPRESSOR) 25 MG tablet Take 0.5 tablets (12.5 mg total) by mouth 2 (two) times daily.  . nicotine (NICODERM CQ - DOSED IN MG/24 HOURS) 21 mg/24hr patch Place 1 patch (21 mg total) onto the skin daily.  . nitroGLYCERIN (NITROSTAT) 0.4 MG SL tablet Place 1 tablet (0.4 mg total) under the tongue every 5 (five) minutes x 3 doses as needed for chest pain.  . tamsulosin (FLOMAX) 0.4 MG CAPS capsule Take 2 capsules (0.8 mg total) by mouth daily.  . ticagrelor (BRILINTA) 90 MG TABS tablet Take 1 tablet (90 mg total) by mouth 2 (two) times daily.  . traMADol (ULTRAM) 50 MG tablet Take 1 tablet (50 mg total) by mouth every 6 (six) hours as needed for severe pain.   No facility-administered encounter medications on file as of 02/27/2017.   :  Review of Systems:  Out of a complete 14 point review of systems, all are reviewed and negative with  the exception of these symptoms as listed below: Review of Systems  Neurological:       Patient states that he has trouble staying asleep at night. He endorses snoring and believes he has sleep apnea.  Epworth Sleepiness Scale 0= would never doze 1= slight chance of dozing 2= moderate chance of dozing 3= high chance of dozing  Sitting and reading:0 Watching TV:0 Sitting inactive in a public place (ex. Theater or meeting):0 As a passenger in a car for an hour without a break:0 Lying down to rest in the afternoon:0 Sitting and talking to someone:0 Sitting quietly after lunch (no alcohol):0 In a car, while stopped in traffic:0 Total:0     Objective:  Neurological Exam  Physical Exam Physical Examination:   Vitals:   02/27/17 1010  BP: 115/68  Pulse: 78    General Examination: The patient is a very pleasant 67 y.o. male in no acute distress. He appears well-developed and well-nourished and adequately groomed.   HEENT: Normocephalic, atraumatic, pupils are equal, round and reactive to light and accommodation. Funduscopic exam is normal with sharp disc margins noted. Extraocular tracking is good without limitation to gaze excursion or nystagmus noted. Normal smooth pursuit is noted. Hearing is grossly intact. Tympanic membranes are clear bilaterally. Face is symmetric with normal facial animation and normal facial sensation. Speech is clear with no dysarthria noted. There is no hypophonia. There is no lip, neck/head, jaw or voice tremor. Neck is supple with full range of passive and active motion. There are no carotid bruits on auscultation. Oropharynx exam reveals: moderate mouth dryness, absence of teeth, redundant soft palate, tonsils in place, but not fully visualized. Mallampati is class III. Tongue protrudes centrally and palate elevates symmetrically. Neck size is 17 5/8 inches.    Chest: Clear to auscultation without wheezing, rhonchi or crackles noted.  Heart: S1+S2+0,  regular and normal without murmurs, rubs or gallops noted.   Abdomen: Soft, non-tender and non-distended with normal bowel sounds appreciated on auscultation.  Extremities: There is no pitting edema in the distal lower extremities bilaterally. Pedal pulses are intact.  Skin: Warm and dry without trophic changes noted.  Musculoskeletal: exam reveals no obvious joint deformities, tenderness or joint swelling or erythema.   Neurologically:  Mental status: The patient is awake, alert and oriented in all 4 spheres. His immediate and remote memory, attention, language skills and fund of knowledge are appropriate. There is no evidence of aphasia, agnosia, apraxia or anomia. Speech is clear with normal prosody and enunciation. Thought process is linear. Mood is normal and affect is normal.  Cranial nerves II - XII are as described above under HEENT exam. In addition: shoulder shrug is normal with equal shoulder height noted. Motor exam: Normal bulk, strength and tone is noted. There is no drift, tremor or rebound. Romberg is negative. Reflexes are 2+ throughout. Fine motor skills and coordination: intact with normal finger taps, normal hand movements, normal rapid alternating patting, normal foot taps and normal foot agility.  Cerebellar testing: No dysmetria or intention tremor. Heel to shin is unremarkable bilaterally. There is no truncal or gait ataxia.  Sensory exam: intact to light touch in the upper and lower extremities.  Gait, station and balance: He stands easily. No veering to one side is noted. No leaning to one side is noted. Posture is age-appropriate and stance is narrow based. Gait shows normal stride length and normal pace. No problems turning are noted.             Assessment and Plan:   In summary, David Hendricks is a very pleasant 67 y.o.-year old male with an underlying medical history of smoking, reflux disease, coronary artery disease with status post stent placement in October 2018,  history of MI, seasonal allergies, anxiety, arthritis, and overweight state, whose history and physical exam are concerning for obstructive sleep apnea (OSA). I had a long chat with the patient about my findings and the diagnosis of OSA, its prognosis and treatment options. We talked about medical treatments, surgical interventions and non-pharmacological approaches. I explained in particular the risks and ramifications of untreated moderate to severe OSA, especially with respect to developing cardiovascular disease down the Road, including congestive heart failure, difficult to treat hypertension, cardiac arrhythmias, or stroke. Even type 2 diabetes has, in part,  been linked to untreated OSA. Symptoms of untreated OSA include daytime sleepiness, memory problems, mood irritability and mood disorder such as depression and anxiety, lack of energy, as well as recurrent headaches, especially morning headaches. We talked about smoking cessation and trying to maintain a healthy lifestyle in general, as well as the importance of weight control. I encouraged the patient to eat healthy, exercise daily and keep well hydrated, to keep a scheduled bedtime and wake time routine, to not skip any meals and eat healthy snacks in between meals. I advised the patient not to drive when feeling sleepy. I recommended the following at this time: sleep study with potential positive airway pressure titration. (We will score hypopneas at 4%).   I explained the sleep test procedure to the patient and also outlined possible surgical and non-surgical treatment options of OSA, including the use of a custom-made dental device (which would require a referral to a specialist dentist or oral surgeon), upper airway surgical options, such as pillar implants, radiofrequency surgery, tongue base surgery, and UPPP (which would involve a referral to an ENT surgeon). Rarely, jaw surgery such as mandibular advancement may be considered.  I also  explained the CPAP treatment option to the patient, who indicated that he would be willing to try CPAP if the need arises. I explained the importance of being compliant with PAP treatment, not only for insurance purposes but primarily to improve His symptoms, and for the patient's long term health benefit, including to reduce His cardiovascular risks. I answered all his questions today and the patient was in agreement. I would like to see him back after the sleep study is completed and encouraged him to call with any interim questions, concerns, problems or updates.   Thank you very much for allowing me to participate in the care of this nice patient. If I can be of any further assistance to you please do not hesitate to call me at [phone].  Sincerely,   Star Age, MD, PhD

## 2017-02-27 NOTE — Progress Notes (Deleted)
Subjective:    Patient ID: David Hendricks is a 67 y.o. male.  HPI {Common ambulatory SmartLinks:[id number]}  Review of Systems  Objective:  Neurological Exam  Physical Exam  Assessment:   ***  Plan:   ***

## 2017-02-28 ENCOUNTER — Ambulatory Visit (HOSPITAL_COMMUNITY): Payer: Medicare Other

## 2017-03-02 ENCOUNTER — Ambulatory Visit (HOSPITAL_COMMUNITY): Payer: Medicare Other

## 2017-03-16 DIAGNOSIS — L308 Other specified dermatitis: Secondary | ICD-10-CM | POA: Diagnosis not present

## 2017-03-19 ENCOUNTER — Institutional Professional Consult (permissible substitution): Payer: Self-pay | Admitting: Neurology

## 2017-03-23 ENCOUNTER — Emergency Department (HOSPITAL_COMMUNITY)
Admission: EM | Admit: 2017-03-23 | Discharge: 2017-03-23 | Disposition: A | Discharge status: Home / Self Care | Payer: Medicare Other | Attending: Emergency Medicine | Admitting: Emergency Medicine

## 2017-03-23 ENCOUNTER — Encounter (HOSPITAL_COMMUNITY): Payer: Self-pay | Admitting: Emergency Medicine

## 2017-03-23 DIAGNOSIS — Z5321 Procedure and treatment not carried out due to patient leaving prior to being seen by health care provider: Secondary | ICD-10-CM | POA: Diagnosis not present

## 2017-03-23 DIAGNOSIS — R42 Dizziness and giddiness: Secondary | ICD-10-CM | POA: Insufficient documentation

## 2017-03-23 LAB — CBC
HCT: 48.5 % (ref 39.0–52.0)
Hemoglobin: 16.8 g/dL (ref 13.0–17.0)
MCH: 29.1 pg (ref 26.0–34.0)
MCHC: 34.6 g/dL (ref 30.0–36.0)
MCV: 83.9 fL (ref 78.0–100.0)
Platelets: 161 10*3/uL (ref 150–400)
RBC: 5.78 MIL/uL (ref 4.22–5.81)
RDW: 13.4 % (ref 11.5–15.5)
WBC: 8.9 10*3/uL (ref 4.0–10.5)

## 2017-03-23 LAB — BASIC METABOLIC PANEL
Anion gap: 10 (ref 5–15)
BUN: 11 mg/dL (ref 6–20)
CALCIUM: 8.9 mg/dL (ref 8.9–10.3)
CO2: 27 mmol/L (ref 22–32)
Chloride: 103 mmol/L (ref 101–111)
Creatinine, Ser: 1 mg/dL (ref 0.61–1.24)
GFR calc Af Amer: >60 mL/min (ref >60)
Glucose, Bld: 118 mg/dL — ABNORMAL HIGH (ref 65–99)
POTASSIUM: 4.1 mmol/L (ref 3.5–5.1)
SODIUM: 140 mmol/L (ref 135–145)

## 2017-03-23 LAB — CBG MONITORING, ED: GLUCOSE-CAPILLARY: 123 mg/dL — AB (ref 65–99)

## 2017-03-23 NOTE — ED Notes (Signed)
Pt states he has been here for 3 hours and is not waiting any longer.

## 2017-03-23 NOTE — ED Triage Notes (Signed)
Pt reports that he felt dizzy for 4 days and was called in some medications for Vertigo but still not helping reports that feels very tired even after sleeping. Pt denies any issues with loss of appetite, n/v/d, or urination

## 2017-03-23 NOTE — ED Notes (Signed)
Patient left and went home.

## 2017-03-23 NOTE — ED Notes (Signed)
Requested patient to urinate. 

## 2017-04-02 ENCOUNTER — Emergency Department (HOSPITAL_COMMUNITY)
Admission: EM | Admit: 2017-04-02 | Discharge: 2017-04-02 | Disposition: A | Discharge status: Home / Self Care | Payer: Medicare Other | Attending: Emergency Medicine | Admitting: Emergency Medicine

## 2017-04-02 ENCOUNTER — Encounter (HOSPITAL_COMMUNITY): Payer: Self-pay | Admitting: Emergency Medicine

## 2017-04-02 ENCOUNTER — Emergency Department (HOSPITAL_COMMUNITY): Payer: Medicare Other

## 2017-04-02 DIAGNOSIS — R42 Dizziness and giddiness: Secondary | ICD-10-CM | POA: Diagnosis not present

## 2017-04-02 DIAGNOSIS — Z5321 Procedure and treatment not carried out due to patient leaving prior to being seen by health care provider: Secondary | ICD-10-CM | POA: Insufficient documentation

## 2017-04-02 DIAGNOSIS — R404 Transient alteration of awareness: Secondary | ICD-10-CM | POA: Diagnosis not present

## 2017-04-02 LAB — I-STAT CHEM 8, ED
BUN: 15 mg/dL (ref 6–20)
CALCIUM ION: 1.16 mmol/L (ref 1.15–1.40)
CHLORIDE: 102 mmol/L (ref 101–111)
Creatinine, Ser: 1 mg/dL (ref 0.61–1.24)
Glucose, Bld: 108 mg/dL — ABNORMAL HIGH (ref 65–99)
HEMATOCRIT: 51 % (ref 39.0–52.0)
Hemoglobin: 17.3 g/dL — ABNORMAL HIGH (ref 13.0–17.0)
Potassium: 4.6 mmol/L (ref 3.5–5.1)
SODIUM: 140 mmol/L (ref 135–145)
TCO2: 28 mmol/L (ref 22–32)

## 2017-04-02 LAB — COMPREHENSIVE METABOLIC PANEL
ALT: 13 U/L — ABNORMAL LOW (ref 17–63)
AST: 18 U/L (ref 15–41)
Albumin: 3.6 g/dL (ref 3.5–5.0)
Alkaline Phosphatase: 114 U/L (ref 38–126)
Anion gap: 7 (ref 5–15)
BILIRUBIN TOTAL: 1.1 mg/dL (ref 0.3–1.2)
BUN: 12 mg/dL (ref 6–20)
CALCIUM: 9.1 mg/dL (ref 8.9–10.3)
CHLORIDE: 104 mmol/L (ref 101–111)
CO2: 28 mmol/L (ref 22–32)
Creatinine, Ser: 1.02 mg/dL (ref 0.61–1.24)
GFR calc Af Amer: >60 mL/min (ref >60)
Glucose, Bld: 112 mg/dL — ABNORMAL HIGH (ref 65–99)
Potassium: 4.7 mmol/L (ref 3.5–5.1)
Sodium: 139 mmol/L (ref 135–145)
Total Protein: 6.5 g/dL (ref 6.5–8.1)

## 2017-04-02 LAB — DIFFERENTIAL
BASOS ABS: 0 10*3/uL (ref 0.0–0.1)
Basophils Relative: 0 %
Eosinophils Absolute: 0.4 10*3/uL (ref 0.0–0.7)
Eosinophils Relative: 3 %
LYMPHS ABS: 2.7 10*3/uL (ref 0.7–4.0)
Lymphocytes Relative: 24 %
MONOS PCT: 5 %
Monocytes Absolute: 0.6 10*3/uL (ref 0.1–1.0)
NEUTROS ABS: 7.7 10*3/uL (ref 1.7–7.7)
Neutrophils Relative %: 68 %

## 2017-04-02 LAB — CBC
HEMATOCRIT: 49.8 % (ref 39.0–52.0)
HEMOGLOBIN: 17 g/dL (ref 13.0–17.0)
MCH: 28.8 pg (ref 26.0–34.0)
MCHC: 34.1 g/dL (ref 30.0–36.0)
MCV: 84.4 fL (ref 78.0–100.0)
Platelets: 149 10*3/uL — ABNORMAL LOW (ref 150–400)
RBC: 5.9 MIL/uL — ABNORMAL HIGH (ref 4.22–5.81)
RDW: 13.6 % (ref 11.5–15.5)
WBC: 11.4 10*3/uL — AB (ref 4.0–10.5)

## 2017-04-02 LAB — I-STAT TROPONIN, ED: Troponin i, poc: 0.04 ng/mL (ref 0.00–0.08)

## 2017-04-02 NOTE — ED Notes (Signed)
Pt g

## 2017-04-02 NOTE — ED Notes (Signed)
Pt states he doesn't want to wait any longer

## 2017-04-02 NOTE — ED Triage Notes (Signed)
Pt to ER for evaluation of dizziness x2 weeks. Meclizine is not relieving symptoms per patient. No IV intact, was brought by EMS.

## 2017-04-27 ENCOUNTER — Ambulatory Visit (INDEPENDENT_AMBULATORY_CARE_PROVIDER_SITE_OTHER): Payer: Medicare Other | Admitting: Neurology

## 2017-04-27 DIAGNOSIS — F17200 Nicotine dependence, unspecified, uncomplicated: Secondary | ICD-10-CM

## 2017-04-27 DIAGNOSIS — Z955 Presence of coronary angioplasty implant and graft: Secondary | ICD-10-CM

## 2017-04-27 DIAGNOSIS — I252 Old myocardial infarction: Secondary | ICD-10-CM

## 2017-04-27 DIAGNOSIS — G4734 Idiopathic sleep related nonobstructive alveolar hypoventilation: Secondary | ICD-10-CM

## 2017-04-27 DIAGNOSIS — R0683 Snoring: Secondary | ICD-10-CM

## 2017-04-27 DIAGNOSIS — G478 Other sleep disorders: Secondary | ICD-10-CM

## 2017-04-27 DIAGNOSIS — G4710 Hypersomnia, unspecified: Secondary | ICD-10-CM

## 2017-04-27 DIAGNOSIS — R058 Other specified cough: Secondary | ICD-10-CM

## 2017-04-27 DIAGNOSIS — R351 Nocturia: Secondary | ICD-10-CM

## 2017-04-27 DIAGNOSIS — G4720 Circadian rhythm sleep disorder, unspecified type: Secondary | ICD-10-CM

## 2017-04-27 DIAGNOSIS — R05 Cough: Secondary | ICD-10-CM

## 2017-05-02 ENCOUNTER — Telehealth: Payer: Self-pay

## 2017-05-02 NOTE — Telephone Encounter (Signed)
I called pt. I advised pt that Dr. Rexene Alberts reviewed pt's sleep study and found that pt did not show any significant osa. Pt did have mild snoring, for which an oral appliance can be made by a qualified dentist. I recommended that pt avoid supine sleep since there was some evidence of sleep apnea events while on his back. Dr. Rexene Alberts recommends that pt discuss with his PCP the possibility of seeing a lung specialist to discuss his lower O2 noted during the study. Pt reports that he does not have a PCP any longer and does not need "Dr. Shon Baton services." Pt said that he will establish care with a new PCP and discuss a referral to a pulmonologist.   I reviewed sleep hygiene recommendations with the pt, including trying to keep a regular sleep wake schedule, avoiding electronics in the bedroom, keeping the bedroom cool, dark, and quiet, and avoiding eating or exercising within 2 hours of bedtime as well as eating in the middle of the night. I advised pt to keep pets out of the bedroom. I discussed with pt the importance of stress relief and to try meditation, deep breathing exercises, and/or a white noise machine or fan to diffuse other noise distractors. I advised pt to not drink alcohol before bedtime and to never mix alcohol and sedating medications. Pt was advised to avoid narcotic pain medication close to bedtime. I advised pt that a copy of these sleep study results will be sent to his PCP, when he establishes care with a new PCP. Pt will let us know when he establishes care with a PCP. Pt verbalized understanding of results. Pt had no questions at this time but was encouraged to call back if questions arise.

## 2017-05-02 NOTE — Procedures (Signed)
PATIENT'S NAME:  David Hendricks, Hoskin DOB:      02-12-1950      MR#:    [id number]     DATE OF RECORDING: 04/27/2017 REFERRING M.D.:  Leanna Battles MD Study Performed:   Baseline Polysomnogram HISTORY: 67 year old man with a history of smoking, reflux disease, coronary artery disease with status post stent placement in October 2018, history of MI, seasonal allergies, anxiety, arthritis, and overweight state, who reports coughing at night, sinus drainage, and chronic difficulty with sleep, nocturia and occasional restless leg symptoms. The patient endorsed the Epworth Sleepiness Scale at 0 points. The patient's weight 210 pounds with a height of 73 (inches), resulting in a BMI of 27.8 kg/m2. The patient's neck circumference measured 17.6 inches.  CURRENT MEDICATIONS: Augmentin, ASA 81mg , Lipitor, Wellbutrin, hydroxyzine, NicodermCQ, Nitrostat, Flomax, Brillinta, Ultram   PROCEDURE:  This is a multichannel digital polysomnogram utilizing the Somnostar 11.2 system.  Electrodes and sensors were applied and monitored per AASM Specifications.   EEG, EOG, Chin and Limb EMG, were sampled at 200 Hz.  ECG, Snore and Nasal Pressure, Thermal Airflow, Respiratory Effort, CPAP Flow and Pressure, Oximetry was sampled at 50 Hz. Digital video and audio were recorded.      BASELINE STUDY  Lights Out was at 22:08 and Lights On at 04:30.  Total recording time (TRT) was 382 minutes, with a total sleep time (TST) of  337 minutes.   The patient's sleep latency was 5.5 minutes.  REM latency was 86.5 minutes.  The sleep efficiency was 88.2 %.     SLEEP ARCHITECTURE: WASO (Wake after sleep onset) was 39.5 minutes with mild sleep fragmentation noted. There were 26 minutes in Stage N1, 179.5 minutes Stage N2, 59.5 minutes Stage N3 and 72 minutes in Stage REM.  The percentage of Stage N1 was 7.7%, Stage N2 was 53.3%, Stage N3 was 17.7% and Stage R (REM sleep) was 21.4%. The arousals were noted as: 16 were spontaneous, 0 were associated  with PLMs, 3 were associated with respiratory events.  RESPIRATORY ANALYSIS:  There were a total of 22 respiratory events:  2 obstructive apneas, 0 central apneas and 0 mixed apneas with a total of 2 apneas and an apnea index (AI) of .4 /hour. There were 20 hypopneas with a hypopnea index of 3.6 /hour. The patient also had 0 respiratory event related arousals (RERAs).      The total APNEA/HYPOPNEA INDEX (AHI) was 3.9/hour and the total RESPIRATORY DISTURBANCE INDEX was 0. 3.9 /hour.  1 events occurred in REM sleep and 40 events in NREM. The REM AHI was 0.8/hour, versus a non-REM AHI of 4.8. The patient spent 27 minutes of total sleep time in the supine position and 310 minutes in non-supine. The supine AHI was 42.2 versus a non-supine AHI of 0.6.  OXYGEN SATURATION & C02:  The Wake baseline 02 saturation was 87%, but the tech noted errors with the oxygen probe, as it slipped off and the patient had a tendency to sleep with the hand fisted. The lowest O2 sat was 78%, but is likely an overestimation and tme spent below 89% saturation equaled 176 minutes, again, likely an overestimation.  PERIODIC LIMB MOVEMENTS: The patient had a total of 65 Periodic Limb Movements.  The Periodic Limb Movement (PLM) index was 11.6 and the PLM Arousal index was 0/hour.  Audio and video analysis did not show any abnormal or unusual movements, behaviors, phonations or vocalizations. The patient took bathroom breaks. Mild intermittent snoring was noted. The EKG was  in keeping with normal sinus rhythm (NSR).   Post-study, the patient indicated that sleep was better than usual.   IMPRESSION:  1. Primary Snoring 2. Dysfunctions associated with sleep stages or arousal from sleep  3. Oxygen desaturations in sleep  RECOMMENDATIONS:  1. This study does not demonstrate any significant obstructive or central sleep disordered breathing with the exception of mild, intermittent snoring and supine OSA; for this, avoidance of the  supine sleep position should suffice as treatment. For disturbing snoring, an oral appliance (through a qualified dentist) can be considered. This study does not support an intrinsic sleep disorder as a cause of the patient's symptoms. Other causes, including circadian rhythm disturbances, an underlying mood disorder, medication effect and/or an underlying medical problem cannot be ruled out. 2. The oxygen saturations trended lower, likely, in part due to longstanding history of smoking, but there were also errors with the O2 monitor, due to movement and sensor slipping off. Smoking cessation should be pursued; if feasible, a pulmonary consultation can be considered.  3. This study shows sleep fragmentation and abnormal sleep stage percentages; these are nonspecific findings and per se do not signify an intrinsic sleep disorder or a cause for the patient's sleep-related symptoms. Causes include (but are not limited to) the first night effect of the sleep study, circadian rhythm disturbances, medication effect or an underlying mood disorder or medical problem.  4. The patient should be cautioned not to drive, work at heights, or operate dangerous or heavy equipment when tired or sleepy. Review and reiteration of good sleep hygiene measures should be pursued with any patient. 5. The patient can follow-up with his referring provider, who will be notified of the test results.  I certify that I have reviewed the entire raw data recording prior to the issuance of this report in accordance with the Standards of Accreditation of the American Academy of Sleep Medicine (AASM)   Star Age, MD, PhD Diplomat, American Board of Psychiatry and Neurology (Neurology and Sleep Medicine)

## 2017-05-02 NOTE — Telephone Encounter (Signed)
-----   Message from Star Age, MD sent at 05/02/2017  1:11 PM EDT ----- Patient referred by Dr. Philip Aspen, seen by me on 02/27/17, diagnostic PSG on 04/27/17.   Please call and notify the patient that the recent sleep study did not show any significant obstructive sleep apnea. He did have some snoring, mostly mild. For disturbing snoring, an oral appliance (through a qualified dentist) can be considered.  There was some evidence of sleep apnea events when he sleeps on his back, therefore I would recommend avoiding sleeping on the back. CPAP therapy is not indicated.  His O2 sats were on the lower side throughout the night, I would recommend he discuss with PCP the possibility of seeing a lung specialist. Furthermore, lower oxygen saturations are often linked to underlying smoking. Therefore, I would highly recommend smoking cessation.  Please remind patient to try to maintain good sleep hygiene, which means: Keep a regular sleep and wake schedule and make enough time for sleep (7 1/2 to 8 1/2 hours for the average adult), try not to exercise or have a meal within 2 hours of your bedtime, try to keep your bedroom conducive for sleep, that is, cool and dark, without light distractors such as an illuminated alarm clock, and refrain from watching TV right before sleep or in the middle of the night and do not keep the TV or radio on during the night. If a nightlight is used, have it away from the visual field. Also, try not to use or play on electronic devices at bedtime, such as your cell phone, tablet PC or laptop. If you like to read at bedtime on an electronic device, try to dim the background light as much as possible. Do not eat in the middle of the night. Keep pets away from the bedroom environment. For stress relief, try meditation, deep breathing exercises (there are many books and CDs available), a white noise machine or fan can help to diffuse other noise distractors, such as traffic noise. Do not drink  alcohol before bedtime, as it can disturb sleep and cause middle of the night awakenings. Never mix alcohol and sedating medications! Avoid narcotic pain medication close to bedtime, as opioids/narcotics can suppress breathing drive and breathing effort.   I would recommend he follow-up with his primary care physician.  Thanks,  Star Age, MD, PhD [facility])

## 2017-05-02 NOTE — Progress Notes (Signed)
Patient referred by Dr. Philip Aspen, seen by me on 02/27/17, diagnostic PSG on 04/27/17.   Please call and notify the patient that the recent sleep study did not show any significant obstructive sleep apnea. He did have some snoring, mostly mild. For disturbing snoring, an oral appliance (through a qualified dentist) can be considered.  There was some evidence of sleep apnea events when he sleeps on his back, therefore I would recommend avoiding sleeping on the back. CPAP therapy is not indicated.  His O2 sats were on the lower side throughout the night, I would recommend he discuss with PCP the possibility of seeing a lung specialist. Furthermore, lower oxygen saturations are often linked to underlying smoking. Therefore, I would highly recommend smoking cessation.  Please remind patient to try to maintain good sleep hygiene, which means: Keep a regular sleep and wake schedule and make enough time for sleep (7 1/2 to 8 1/2 hours for the average adult), try not to exercise or have a meal within 2 hours of your bedtime, try to keep your bedroom conducive for sleep, that is, cool and dark, without light distractors such as an illuminated alarm clock, and refrain from watching TV right before sleep or in the middle of the night and do not keep the TV or radio on during the night. If a nightlight is used, have it away from the visual field. Also, try not to use or play on electronic devices at bedtime, such as your cell phone, tablet PC or laptop. If you like to read at bedtime on an electronic device, try to dim the background light as much as possible. Do not eat in the middle of the night. Keep pets away from the bedroom environment. For stress relief, try meditation, deep breathing exercises (there are many books and CDs available), a white noise machine or fan can help to diffuse other noise distractors, such as traffic noise. Do not drink alcohol before bedtime, as it can disturb sleep and cause middle of the  night awakenings. Never mix alcohol and sedating medications! Avoid narcotic pain medication close to bedtime, as opioids/narcotics can suppress breathing drive and breathing effort.   I would recommend he follow-up with his primary care physician.  Thanks,  David Age, MD, PhD [facility])

## 2017-05-10 ENCOUNTER — Ambulatory Visit (INDEPENDENT_AMBULATORY_CARE_PROVIDER_SITE_OTHER): Payer: Medicare Other | Admitting: Family Medicine

## 2017-05-10 ENCOUNTER — Encounter: Payer: Self-pay | Admitting: Family Medicine

## 2017-05-10 ENCOUNTER — Other Ambulatory Visit: Payer: Self-pay

## 2017-05-10 VITALS — BP 112/60 | HR 84 | Temp 98.2°F | Ht 73.0 in | Wt 203.6 lb

## 2017-05-10 DIAGNOSIS — Z23 Encounter for immunization: Secondary | ICD-10-CM

## 2017-05-10 DIAGNOSIS — R42 Dizziness and giddiness: Secondary | ICD-10-CM | POA: Diagnosis not present

## 2017-05-10 DIAGNOSIS — M25511 Pain in right shoulder: Secondary | ICD-10-CM

## 2017-05-10 DIAGNOSIS — F17210 Nicotine dependence, cigarettes, uncomplicated: Secondary | ICD-10-CM | POA: Diagnosis not present

## 2017-05-10 DIAGNOSIS — G8929 Other chronic pain: Secondary | ICD-10-CM | POA: Diagnosis not present

## 2017-05-10 DIAGNOSIS — I25118 Atherosclerotic heart disease of native coronary artery with other forms of angina pectoris: Secondary | ICD-10-CM | POA: Diagnosis not present

## 2017-05-10 MED ORDER — MECLIZINE HCL 25 MG PO TABS: 25.0000 mg | ORAL_TABLET | Freq: Three times a day (TID) | ORAL | 0 refills | Status: DC | PRN

## 2017-05-10 MED ORDER — FLUTICASONE PROPIONATE 50 MCG/ACT NA SUSP: 2.0000 | Freq: Every day | NASAL | 3 refills | Status: DC

## 2017-05-10 MED ORDER — VARENICLINE TARTRATE 0.5 MG X 11 & 1 MG X 42 PO MISC: ORAL | 0 refills | Status: DC

## 2017-05-10 NOTE — Assessment & Plan Note (Addendum)
~  15 minutes spent counseling on need to quit smoking.  Reviewed medications for smoking cessation Tried patches and wellbutrin however has been unsuccessful Is ready to quit and would like to try chantix Discussed potential side effects related to chantix F/u in 6 weeks  Prevention/maintenance Prevnar 13 given today.  Aneurysm screening:  No aneurysm noted on Contrast enhanced CT scan of abdomen/pelvis in 2017.
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
## 2017-05-10 NOTE — Patient Instructions (Signed)
Start chantix to help with quitting smoking.  Be sure to set a quit date and stick to it! I will see you back in about 6 weeks.     Vertigo Vertigo means that you feel like you are moving when you are not. Vertigo can also make you feel like things around you are moving when they are not. This feeling can come and go at any time. Vertigo often goes away on its own. Follow these instructions at home:  Avoid making fast movements.  Avoid driving.  Avoid using heavy machinery.  Avoid doing any task or activity that might cause danger to you or other people if you would have a vertigo attack while you are doing it.  Sit down right away if you feel dizzy or have trouble with your balance.  Take over-the-counter and prescription medicines only as told by your doctor.  Follow instructions from your doctor about which positions or movements you should avoid.  Drink enough fluid to keep your pee (urine) clear or pale yellow.  Keep all follow-up visits as told by your doctor. This is important. Contact a doctor if:  Medicine does not help your vertigo.  You have a fever.  Your problems get worse or you have new symptoms.  Your family or friends see changes in your behavior.  You feel sick to your stomach (nauseous) or you throw up (vomit).  You have a "pins and needles" feeling or you are numb in part of your body. Get help right away if:  You have trouble moving or talking.  You are always dizzy.  You pass out (faint).  You get very bad headaches.  You feel weak or have trouble using your hands, arms, or legs.  You have changes in your hearing.  You have changes in your seeing (vision).  You get a stiff neck.  Bright light starts to bother you. This information is not intended to replace advice given to you by your health care provider. Make sure you discuss any questions you have with your health care provider. Document Released: 10/05/2007 Document Revised:  06/03/2015 Document Reviewed: 04/20/2014 Elsevier Interactive Patient Education  [employer].

## 2017-05-10 NOTE — Assessment & Plan Note (Signed)
Stable at this time, denies anginal symptoms.  He will continue to follow with Dr. Irish Lack

## 2017-05-10 NOTE — Assessment & Plan Note (Addendum)
Chronic in nature, has had multiple steroid injections previously with continued pain.  Also reports starting to get rashes after injections Prefers to see Sports med and/or orthopedics, referral placed to sports medicine.

## 2017-05-10 NOTE — Assessment & Plan Note (Signed)
Positive Dix-hallpike with mild horizontal nystagmus, worse on the R. Increased meclizine to 25mg  Start flonase for eustachian tube dysfunction If not improving with this will refer for vestibular rehab.

## 2017-05-21 ENCOUNTER — Ambulatory Visit (INDEPENDENT_AMBULATORY_CARE_PROVIDER_SITE_OTHER): Payer: Medicare Other

## 2017-05-21 ENCOUNTER — Ambulatory Visit (INDEPENDENT_AMBULATORY_CARE_PROVIDER_SITE_OTHER): Payer: Medicare Other | Admitting: Family Medicine

## 2017-05-21 ENCOUNTER — Encounter: Payer: Self-pay | Admitting: Family Medicine

## 2017-05-21 VITALS — BP 110/60 | HR 75 | Temp 98.1°F | Ht 73.0 in | Wt 207.2 lb

## 2017-05-21 DIAGNOSIS — G8929 Other chronic pain: Secondary | ICD-10-CM | POA: Diagnosis not present

## 2017-05-21 DIAGNOSIS — M25511 Pain in right shoulder: Secondary | ICD-10-CM

## 2017-05-21 DIAGNOSIS — M79645 Pain in left finger(s): Secondary | ICD-10-CM

## 2017-05-21 DIAGNOSIS — S6992XA Unspecified injury of left wrist, hand and finger(s), initial encounter: Secondary | ICD-10-CM | POA: Diagnosis not present
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
## 2017-05-21 NOTE — Assessment & Plan Note (Signed)
Pain could be capsular in origin with pain on ER. Does have findings on [place] to suggest tendinopathy and bursitis.  - GH injection today - avoiding nitro with history of MI  - counseled on HEP - xray today  - if no improvement consider PT, or AC joint injection. Would avoid subacromial injections if able due to the amount he has received there already.

## 2017-05-21 NOTE — Patient Instructions (Signed)
Please try the exercises  Please let me know if you have any improvement of your pain  I will call you with the results from today  Please follow up with me in 4-6 weeks if you don't have improvement of your pain.

## 2017-05-22 ENCOUNTER — Telehealth: Payer: Self-pay | Admitting: Family Medicine

## 2017-05-22 NOTE — Telephone Encounter (Signed)
Left VM for patient. If he calls back please have him speak with a nurse/CMA and inform that his xrays didn't show a fracture in his finger and his shoulder didn't demonstrate any arthritis in the shoulder joint but did show arthritis in the [facility] joint. The P[facility] can report results to patient.   If any questions then please take the best time and phone number to call and I will try to call him back.   Rosemarie Ax, MD [facility] 05/22/2017, 8:21 AM

## 2017-06-12 ENCOUNTER — Other Ambulatory Visit: Payer: Self-pay

## 2017-06-12 ENCOUNTER — Emergency Department (HOSPITAL_COMMUNITY): Payer: Medicare Other

## 2017-06-12 ENCOUNTER — Encounter (HOSPITAL_COMMUNITY): Payer: Self-pay

## 2017-06-12 ENCOUNTER — Emergency Department (HOSPITAL_COMMUNITY)
Admission: EM | Admit: 2017-06-12 | Discharge: 2017-06-12 | Disposition: A | Discharge status: Home / Self Care | Payer: Medicare Other | Attending: Emergency Medicine | Admitting: Emergency Medicine

## 2017-06-12 ENCOUNTER — Ambulatory Visit: Payer: Self-pay

## 2017-06-12 DIAGNOSIS — N200 Calculus of kidney: Secondary | ICD-10-CM | POA: Diagnosis not present

## 2017-06-12 DIAGNOSIS — Z7902 Long term (current) use of antithrombotics/antiplatelets: Secondary | ICD-10-CM | POA: Diagnosis not present

## 2017-06-12 DIAGNOSIS — R10822 Left upper quadrant rebound abdominal tenderness: Secondary | ICD-10-CM | POA: Diagnosis not present

## 2017-06-12 DIAGNOSIS — I259 Chronic ischemic heart disease, unspecified: Secondary | ICD-10-CM | POA: Insufficient documentation

## 2017-06-12 DIAGNOSIS — Z79899 Other long term (current) drug therapy: Secondary | ICD-10-CM | POA: Insufficient documentation

## 2017-06-12 DIAGNOSIS — R0789 Other chest pain: Secondary | ICD-10-CM

## 2017-06-12 DIAGNOSIS — R079 Chest pain, unspecified: Secondary | ICD-10-CM | POA: Diagnosis not present

## 2017-06-12 DIAGNOSIS — Z955 Presence of coronary angioplasty implant and graft: Secondary | ICD-10-CM | POA: Insufficient documentation

## 2017-06-12 DIAGNOSIS — R0602 Shortness of breath: Secondary | ICD-10-CM | POA: Diagnosis not present

## 2017-06-12 DIAGNOSIS — F17210 Nicotine dependence, cigarettes, uncomplicated: Secondary | ICD-10-CM | POA: Diagnosis not present

## 2017-06-12 DIAGNOSIS — Z7982 Long term (current) use of aspirin: Secondary | ICD-10-CM | POA: Insufficient documentation

## 2017-06-12 DIAGNOSIS — R0902 Hypoxemia: Secondary | ICD-10-CM | POA: Diagnosis not present

## 2017-06-12 LAB — CBC
HCT: 52.4 % — ABNORMAL HIGH (ref 39.0–52.0)
Hemoglobin: 17.7 g/dL — ABNORMAL HIGH (ref 13.0–17.0)
MCH: 28.2 pg (ref 26.0–34.0)
MCHC: 33.8 g/dL (ref 30.0–36.0)
MCV: 83.6 fL (ref 78.0–100.0)
Platelets: 154 10*3/uL (ref 150–400)
RBC: 6.27 MIL/uL — ABNORMAL HIGH (ref 4.22–5.81)
RDW: 13.1 % (ref 11.5–15.5)
WBC: 9.6 10*3/uL (ref 4.0–10.5)

## 2017-06-12 LAB — BASIC METABOLIC PANEL
Anion gap: 7 (ref 5–15)
BUN: 11 mg/dL (ref 6–20)
CALCIUM: 8 mg/dL — AB (ref 8.9–10.3)
CO2: 25 mmol/L (ref 22–32)
CREATININE: 0.82 mg/dL (ref 0.61–1.24)
Chloride: 109 mmol/L (ref 101–111)
GFR calc Af Amer: >60 mL/min (ref >60)
GFR calc non Af Amer: >60 mL/min (ref >60)
Glucose, Bld: 88 mg/dL (ref 65–99)
Potassium: 3.9 mmol/L (ref 3.5–5.1)
Sodium: 141 mmol/L (ref 135–145)

## 2017-06-12 LAB — HEPATIC FUNCTION PANEL
ALT: 8 U/L — ABNORMAL LOW (ref 17–63)
AST: 15 U/L (ref 15–41)
Albumin: 3.1 g/dL — ABNORMAL LOW (ref 3.5–5.0)
Alkaline Phosphatase: 87 U/L (ref 38–126)
BILIRUBIN DIRECT: 0.2 mg/dL (ref 0.1–0.5)
BILIRUBIN INDIRECT: 0.7 mg/dL (ref 0.3–0.9)
Total Bilirubin: 0.9 mg/dL (ref 0.3–1.2)
Total Protein: 5.3 g/dL — ABNORMAL LOW (ref 6.5–8.1)

## 2017-06-12 LAB — I-STAT TROPONIN, ED
TROPONIN I, POC: 0 ng/mL (ref 0.00–0.08)
Troponin i, poc: 0.02 ng/mL (ref 0.00–0.08)

## 2017-06-12 LAB — D-DIMER, QUANTITATIVE (NOT AT ARMC): D DIMER QUANT: 0.51 ug{FEU}/mL — AB (ref 0.00–0.50)

## 2017-06-12 LAB — LIPASE, BLOOD: Lipase: 49 U/L (ref 11–51)

## 2017-06-12 MED ORDER — FENTANYL CITRATE (PF) 100 MCG/2ML IJ SOLN
50.0000 ug | Freq: Once | INTRAMUSCULAR | Status: AC
Start: 2017-06-12 — End: 2017-06-12
  Administered 2017-06-12: 50 ug via INTRAVENOUS
  Filled 2017-06-12: qty 2

## 2017-06-12 MED ORDER — IOHEXOL 300 MG/ML  SOLN
100.0000 mL | Freq: Once | INTRAMUSCULAR | Status: AC | PRN
  Administered 2017-06-12: 100 mL via INTRAVENOUS

## 2017-06-12 NOTE — ED Notes (Signed)
Patient transported to X-ray 

## 2017-06-12 NOTE — ED Provider Notes (Signed)
Pt signed out by L Layden, PA-C.  Please see previous notes for further history.  In brief, patient presented for evaluation of chest pain x24 hours.  History of an STEMI in October.  Has not followed up with cardiology.  Pain is described as a sharp pressure of the left side, worse with inspiration.  No worsening with exertion.  No associated shortness of breath or nausea.  He does report intermittent difficulty breathing.  No recent PE risk factors.  No leg swelling.  He continues to smoke cigarettes.  History of hypertension, hyperlipidemia, and diabetes.  Patient states this feels similar to when he had an end STEMI.  On exam, pain is reproducible, pulmonary exam reassuring.  EKG shows left anterior fascicular block, similar to previous.  Initial labs reassuring, initial troponin negative.  Heart score of 5.  On reevaluation, patient reported some abdominal pain.  Will obtain further labs, d-dimer, and delta trope.  If labs are normal, will consult with cards to set up an appointment.  If dimer is positive, plan for scan of chest and abdomen to rule out dissection.  D-dimer mildly elevated, although an age-adjusted is normal.  Discussed with Dr. Ralene Bathe, who does not believe CTA to rule out PE or dissection as necessary.  We will continue with CT abdomen. Delta trop negative.  CT abdomen without acute findings.  Discussed with patient follow-up with cardiology, and plan to consult with cardiology.  Patient does not want cardiology consult, states he will f/u with his cardiology doctor this week. Discussed that I recommended talking to cardiology today, but pt refused. Stressed importance of f/u with cards. At this time, pt appears safe for d/c. Return precautions given. Pt states he understands and agrees to plan.       Franchot Heidelberg, PA-C 06/13/17 0117    Little, Wenda Overland, MD 06/13/17 [phone]

## 2017-06-12 NOTE — Discharge Instructions (Signed)
Continue taking your home medications as prescribed. Use Tylenol as needed for pain. It is very important that you follow-up with your cardiologist this week. Return to the emergency room if you develop difficulty breathing, worsening symptoms, or any new or concerning symptoms.

## 2017-06-12 NOTE — Telephone Encounter (Signed)
FYI

## 2017-06-12 NOTE — ED Notes (Signed)
Pt denies any further chest pain at this time ; pt denies any further SOB

## 2017-06-12 NOTE — ED Provider Notes (Signed)
[facility] EMERGENCY DEPARTMENT Provider Note   CSN: [id number] Arrival date & time: 06/12/17  1216     History   Chief Complaint Chief Complaint  Patient presents with  . Chest Pain    HPI David Hendricks is a 67 y.o. male past medical history anxiety, arthritis, CAD, dysrhythmia, NSTEMI s/p PCI to LAD, HTN, HLD, tobacco abuse who presents for evaluation of left-sided chest pain that began 24 hours ago.  Patient reports a chain is nonradiating.  He describes it as a "sharp, pressure."  Patient states it is worse with deep inspiration.  He has not noticed if it is worse with exertion.  Patient denies any associated nausea, diaphoresis.  Patient reports he took aspirin and nitro at home and states he did not have much improvement.  Patient was given additional aspirin and sublingual nitro by EMS in route.  He states that it did not improve his pain.  Currently right now he is at a 4/10 with his pain.  Patient reports that he has not been taking his regular heart medications like he is supposed to.  Patient reports that he has not seen a cardiologist in several months.  Patient states that he had a STEMI in January and was concerned because some of the chest pain he felt was similar to what he had previously experienced.  Patient does report some mild associated shortness of breath.  He reports that he is currently smoking.  He states he has been trying to quit with Chantix but is still smoking. He denies any family history of CAD. Patient denies any recent surgeries, hospitalizations, immobilization, leg swelling.  Patient denies any fevers, abdominal pain, numbness/weakness, dizziness, neck pain, back pain, vision changes.    The history is provided by the patient.    Past Medical History:  Diagnosis Date  . Anxiety   . Arthritis   . Coronary artery disease    10/18 PCI/DES to pLAD, mild nonobstructive disease in the Lcx/RCA. Normal EF.   Marland Kitchen Dysrhythmia 2016   irregular  heartbeat  . Myocardial infarction ([place])   . Seasonal allergies     Patient Active Problem List   Diagnosis Date Noted  . Pain of finger of left hand 05/21/2017  . Cigarette nicotine dependence without complication 36/46/8032  . Vertigo 05/10/2017  . Old MI (myocardial infarction) 01/30/2017  . CAD (coronary artery disease) 01/30/2017  . Chest pain with moderate risk of acute coronary syndrome 01/23/2017  . Hyperlipidemia 10/10/2016  . History of non-ST elevation myocardial infarction (NSTEMI) 10/09/2016  . Difficulty sleeping 04/22/2016  . Urinary dysfunction 04/22/2016  . Right shoulder pain 02/11/2016    Past Surgical History:  Procedure Laterality Date  . CORONARY ANGIOPLASTY  10/09/2016  . CORONARY STENT INTERVENTION N/A 10/09/2016   Procedure: CORONARY STENT INTERVENTION;  Surgeon: Nelva Bush, MD;  Location: [facility];  Service: Cardiovascular;  Laterality: N/A;  . INTRAVASCULAR ULTRASOUND/IVUS N/A 10/09/2016   Procedure: Intravascular Ultrasound/IVUS;  Surgeon: Nelva Bush, MD;  Location: [facility];  Service: Cardiovascular;  Laterality: N/A;  . LEFT HEART CATH AND CORONARY ANGIOGRAPHY N/A 10/09/2016   Procedure: LEFT HEART CATH AND CORONARY ANGIOGRAPHY;  Surgeon: Nelva Bush, MD;  Location: [facility];  Service: Cardiovascular;  Laterality: N/A;        Home Medications    Prior to Admission medications   Medication Sig Start Date End Date Taking? Authorizing Provider  aspirin 81 MG EC tablet Take 1 tablet (  81 mg total) by mouth daily. 10/10/16   Cheryln Manly, NP  atorvastatin (LIPITOR) 80 MG tablet Take 1 tablet (80 mg total) by mouth daily at 6 PM. 10/10/16   Cheryln Manly, NP  buPROPion [facility] SR) 150 MG 12 hr tablet Take 2 tablets (300 mg total) by mouth daily. 01/24/17 01/24/18  Geradine Girt, DO  fluticasone (FLONASE) 50 MCG/ACT nasal spray Place 2 sprays into both nostrils daily. 05/10/17   [name], DO    meclizine (ANTIVERT) 25 MG tablet Take 1 tablet (25 mg total) by mouth 3 (three) times daily as needed for dizziness. 05/10/17   [name], DO  metoprolol tartrate (LOPRESSOR) 25 MG tablet Take 0.5 tablets (12.5 mg total) by mouth 2 (two) times daily. 01/30/17   [name], MD  nitroGLYCERIN (NITROSTAT) 0.4 MG SL tablet Place 1 tablet (0.4 mg total) under the tongue every 5 (five) minutes x 3 doses as needed for chest pain. 01/24/17   Geradine Girt, DO  tamsulosin (FLOMAX) 0.4 MG CAPS capsule Take 2 capsules (0.8 mg total) by mouth daily. 02/11/16   Ledell Noss, MD  ticagrelor (BRILINTA) 90 MG TABS tablet Take 1 tablet (90 mg total) by mouth 2 (two) times daily. 10/10/16   Cheryln Manly, NP  traMADol (ULTRAM) 50 MG tablet Take 1 tablet (50 mg total) by mouth every 6 (six) hours as needed for severe pain. 08/08/16   Valinda Party, DO  triamcinolone cream (KENALOG) 0.1 % APPLY TO THE AFFECTED AREA ON THE SKIN BID 03/16/17   [provider]  varenicline (CHANTIX STARTING MONTH PAK) 0.5 MG X 11 & 1 MG X 42 tablet Take one 0.5 mg tablet PO once daily  x3 days, then increase to one 0.5 mg tab BID for 4 days, then increase to one 1 mg tab BID. 05/10/17   [name], DO    Family History Family History  Problem Relation Age of Onset  . Cancer Father     Social History Social History   Tobacco Use  . Smoking status: Current Every Day Smoker    Packs/day: 2.00    Years: 45.00    Pack years: 90.00    Types: Cigarettes    Start date: 01/09/1961  . Smokeless tobacco: Never Used  . Tobacco comment: thinking about after settling in   Substance Use Topics  . Alcohol use: No  . Drug use: No     Allergies   Patient has no known allergies.   Review of Systems Review of Systems  Constitutional: Negative for fever.  Respiratory: Negative for cough and shortness of breath.   Cardiovascular: Positive for chest pain.  Gastrointestinal: Negative for abdominal pain,  nausea and vomiting.  Genitourinary: Negative for dysuria and hematuria.  Neurological: Negative for headaches.  All other systems reviewed and are negative.    Physical Exam Updated Vital Signs BP 112/61 (BP Location: Left Arm)   Pulse 71   Temp 97.7 F (36.5 C) (Oral)   Resp 20   Ht 6\' 1"  (1.854 m)   Wt 94.3 kg (208 lb)   SpO2 96%   BMI 27.44 kg/m   Physical Exam  Constitutional: He is oriented to person, place, and time. He appears well-developed and well-nourished.  HENT:  Head: Normocephalic and atraumatic.  Mouth/Throat: Oropharynx is clear and moist and mucous membranes are normal.  Eyes: Pupils are equal, round, and reactive to light. Conjunctivae, EOM and lids are normal.  Neck: Full  passive range of motion without pain.  Cardiovascular: Normal rate, regular rhythm and normal heart sounds. Exam reveals no gallop and no friction rub.  No murmur heard. Pulses:      Radial pulses are 2+ on the right side, and 2+ on the left side.       Dorsalis pedis pulses are 2+ on the right side, and 2+ on the left side.  Pulmonary/Chest: Effort normal and breath sounds normal.  Abdominal: Soft. Normal appearance. There is no tenderness. There is no rigidity and no guarding.  Musculoskeletal: Normal range of motion.  Neurological: He is alert and oriented to person, place, and time.  Skin: Skin is warm and dry. Capillary refill takes less than 2 seconds.  Psychiatric: He has a normal mood and affect. His speech is normal.  Nursing note and vitals reviewed.  ED Treatments / Results  Labs (all labs ordered are listed, but only abnormal results are displayed) Labs Reviewed  CBC - Abnormal; Notable for the following components:      Result Value   RBC 6.27 (*)    Hemoglobin 17.7 (*)    HCT 52.4 (*)    All other components within normal limits  BASIC METABOLIC PANEL  I-STAT TROPONIN, ED    EKG EKG Interpretation  Date/Time:  Tuesday June 12 2017 12:14:54 EDT Ventricular  Rate:  71 PR Interval:  176 QRS Duration: 112 QT Interval:  392 QTC Calculation: 425 R Axis:   -55 Text Interpretation:  Normal sinus rhythm Left anterior fascicular block Abnormal ECG Confirmed by Quintella Reichert [phone]) on 06/12/2017 12:37:25 PM   Radiology No results found.  Procedures Procedures (including critical care time)  Medications Ordered in ED Medications - No data to display   Initial Impression / Assessment and Plan / ED Course  I have reviewed the triage vital signs and the nursing notes.  Pertinent labs & imaging results that were available during my care of the patient were reviewed by me and considered in my medical decision making (see chart for details).     67 y.o. M with past medical history anxiety, arthritis, CAD, dysrhythmia, NSTEMI s/p PCI to LAD, HTN, HLD, tobacco abuse who presents for evaluation of left-sided chest pain that began 24 hours ago.  Worse with deep inspiration.  Does not recall if it is worse with exertion.  Took aspirin nitro at home with minimal improvement.  Patient was concerned because it felt similar to when he had an STEMI in October 2018.  Has not seen cardiology over the last 3 months.  Patient also reports that he has been not been taking his medications. Patient is afebrile, non-toxic appearing, sitting comfortably on examination table. Vital signs reviewed and stable.  Consider ACS etiology versus infectious etiology.  History/physical exam is not concerning for aortic dissection.  Do not suspect PE given history/physical exam.  Review of records show that he last saw cardiology in January 2019.  Per review of cardiology note when patient had a NSTEMI in October 2018, his cath showed severe single-vessel coronary artery disease with 50% ostial and 90% proximal LAD stenosis.  He also had mild nonobstructive CAD involving the left circumflex and RCA.  EF at that time was 50 to 55%.  Patient had stent placement in the proximal/mid LAD.   Patient is on dual antiplatelet therapy with aspirin and brilinta.   BMP unremarkable.  CBC shows hemoglobin of 17.7.  Otherwise unremarkable.  Troponin negative.  X-ray negative for any acute infectious  etiology.   Patient with a HEART score of 5. Will plan to delta trop. His story is atypical. If delta trop is negative and patient is pain free, he could be discharged home with outpatient cards.   Dr. Ralene Bathe evaluated patient.  On her evaluation, patient did have some tenderness to the upper abdomen.  We will plan to add on lipase, LFTs.  Additionally, given the fact the pain is worse with deep respiration, will add d-dimer.  Patient signed out to Kara Mead, PA-C with labs pending.  Please see ED course for further evaluation.  Final Clinical Impressions(s) / ED Diagnoses   Final diagnoses:  Atypical chest pain    ED Discharge Orders    None       Desma Mcgregor 06/12/17 2131    Quintella Reichert, MD 06/15/17 [phone]

## 2017-06-12 NOTE — ED Triage Notes (Signed)
Pt brought in by [facility] for c/o non radiating left sided  CP x24 hours  Along with some SOB ; pt denies any cough; pt  Had STEMI in [place] and had stent placed in ; pt received 324 of ASA and 0.4 NItro SL on field   Bp: 105/70 HR:80 O2: 93% RA

## 2017-06-12 NOTE — Telephone Encounter (Signed)
Pt. Reports he has had chest pain x 24 hours.Left-sided chest pain.Radiates to left shoulder. States he had a heart attack in Jan 2019 with stents. No nausea,sweating. Can't "get a good breath." Instructed to take one of his Nitroglycerin pills. 911 called for pt. 911 in route.  Reason for Disposition . [1] Chest pain lasts > 5 minutes AND [2] history of heart disease  (i.e., heart attack, bypass surgery, angina, angioplasty, CHF; not just a heart murmur)  Answer Assessment - Initial Assessment Questions 1. LOCATION: "Where does it hurt?"       On the left side 2. RADIATION: "Does the pain go anywhere else?" (e.g., into neck, jaw, arms, back)     Left shoulder 3. ONSET: "When did the chest pain begin?" (Minutes, hours or days)      24 Hours 4. PATTERN "Does the pain come and go, or has it been constant since it started?"  "Does it get worse with exertion?"      Constant 5. DURATION: "How long does it last" (e.g., seconds, minutes, hours)     Constant 6. SEVERITY: "How bad is the pain?"  (e.g., Scale 1-10; mild, moderate, or severe)    - MILD (1-3): doesn't interfere with normal activities     - MODERATE (4-7): interferes with normal activities or awakens from sleep    - SEVERE (8-10): excruciating pain, unable to do any normal activities       4 7. CARDIAC RISK FACTORS: "Do you have any history of heart problems or risk factors for heart disease?" (e.g., prior heart attack, angina; high blood pressure, diabetes, being overweight, high cholesterol, smoking, or strong family history of heart disease)     Heart attack in Jan. 2019 8. PULMONARY RISK FACTORS: "Do you have any history of lung disease?"  (e.g., blood clots in lung, asthma, emphysema, birth control pills)     No 9. CAUSE: "What do you think is causing the chest pain?"     Unsure 10. OTHER SYMPTOMS: "Do you have any other symptoms?" (e.g., dizziness, nausea, vomiting, sweating, fever, difficulty breathing, cough)       Can't get a  full breath 11. PREGNANCY: "Is there any chance you are pregnant?" "When was your last menstrual period?"       n/a  Protocols used: CHEST PAIN-A-AH

## 2017-06-12 NOTE — ED Notes (Signed)
Patient transported to CT 

## 2017-06-20 DIAGNOSIS — H04123 Dry eye syndrome of bilateral lacrimal glands: Secondary | ICD-10-CM | POA: Diagnosis not present

## 2017-06-20 DIAGNOSIS — H5203 Hypermetropia, bilateral: Secondary | ICD-10-CM | POA: Diagnosis not present

## 2017-06-21 ENCOUNTER — Ambulatory Visit (INDEPENDENT_AMBULATORY_CARE_PROVIDER_SITE_OTHER): Payer: Medicare Other | Admitting: Family Medicine

## 2017-06-21 ENCOUNTER — Encounter: Payer: Self-pay | Admitting: Family Medicine

## 2017-06-21 ENCOUNTER — Other Ambulatory Visit: Payer: Self-pay | Admitting: Family Medicine

## 2017-06-21 VITALS — BP 112/56 | HR 88 | Temp 98.2°F | Ht 73.0 in | Wt 204.8 lb

## 2017-06-21 DIAGNOSIS — M25511 Pain in right shoulder: Secondary | ICD-10-CM

## 2017-06-21 DIAGNOSIS — F320 Major depressive disorder, single episode, mild: Secondary | ICD-10-CM | POA: Diagnosis not present

## 2017-06-21 DIAGNOSIS — G8929 Other chronic pain: Secondary | ICD-10-CM | POA: Diagnosis not present

## 2017-06-21 DIAGNOSIS — R0781 Pleurodynia: Secondary | ICD-10-CM | POA: Diagnosis not present

## 2017-06-21 DIAGNOSIS — R42 Dizziness and giddiness: Secondary | ICD-10-CM | POA: Diagnosis not present

## 2017-06-21 DIAGNOSIS — F17210 Nicotine dependence, cigarettes, uncomplicated: Secondary | ICD-10-CM | POA: Diagnosis not present

## 2017-06-21 MED ORDER — MELOXICAM 15 MG PO TABS: 15.0000 mg | ORAL_TABLET | Freq: Every day | ORAL | 1 refills | Status: DC

## 2017-06-21 MED ORDER — TRAZODONE HCL 100 MG PO TABS: 50.0000 mg | ORAL_TABLET | Freq: Every evening | ORAL | 2 refills | Status: DC | PRN

## 2017-06-21 NOTE — Assessment & Plan Note (Signed)
Worsening depression.  Insomnia seems to be most bothersome symptom he is having Continue bupropion Rx for trazodone Referral for psychotherapy.

## 2017-06-21 NOTE — Assessment & Plan Note (Signed)
Significant improvement since injection by Dr. Raeford Razor.

## 2017-06-21 NOTE — Assessment & Plan Note (Signed)
This has resolved at this time He will monitor for recurrence of any symptoms.

## 2017-06-21 NOTE — Assessment & Plan Note (Addendum)
Ambiguous about quitting at this time due to stress level. Encouraged to try to continue to cut back with goal of quitting. We will continue to address this with him and his next follow-up in 6 weeks. ~87min spent counseling.

## 2017-06-21 NOTE — Assessment & Plan Note (Signed)
Likely costochondritis, prominence noted on L side.  Recent imaging reviewed, no abnormality noted.  Rx for meloxicam Heating pad may be helpful as well.

## 2017-06-21 NOTE — Patient Instructions (Signed)
Costochondritis Costochondritis is swelling and irritation (inflammation) of the tissue (cartilage) that connects your ribs to your breastbone (sternum). This causes pain in the front of your chest. Usually, the pain:  Starts gradually.  Is in more than one rib.  This condition usually goes away on its own over time. Follow these instructions at home:  Do not do anything that makes your pain worse.  If directed, put ice on the painful area: ? Put ice in a plastic bag. ? Place a towel between your skin and the bag. ? Leave the ice on for 20 minutes, 2-3 times a day.  If directed, put heat on the affected area as often as told by your doctor. Use the heat source that your doctor tells you to use, such as a moist heat pack or a heating pad. ? Place a towel between your skin and the heat source. ? Leave the heat on for 20-30 minutes. ? Take off the heat if your skin turns bright red. This is very important if you cannot feel pain, heat, or cold. You may have a greater risk of getting burned.  Take over-the-counter and prescription medicines only as told by your doctor.  Return to your normal activities as told by your doctor. Ask your doctor what activities are safe for you.  Keep all follow-up visits as told by your doctor. This is important. Contact a doctor if:  You have chills or a fever.  Your pain does not go away or it gets worse.  You have a cough that does not go away. Get help right away if:  You are short of breath. This information is not intended to replace advice given to you by your health care provider. Make sure you discuss any questions you have with your health care provider. Document Released: 06/14/2007 Document Revised: 07/16/2015 Document Reviewed: 04/21/2015 Elsevier Interactive Patient Education  2018 [employer].   Major Depressive Disorder, Adult Major depressive disorder (MDD) is a mental health condition. MDD often makes you feel sad, hopeless, or  helpless. MDD can also cause symptoms in your body. MDD can affect your:  Work.  School.  Relationships.  Other normal activities.  MDD can range from mild to very bad. It may occur once (single episode MDD). It can also occur many times (recurrent MDD). The main symptoms of MDD often include:  Feeling sad, depressed, or irritable most of the time.  Loss of interest.  MDD symptoms also include:  Sleeping too much or too little.  Eating too much or too little.  A change in your weight.  Feeling tired (fatigue) or having low energy.  Feeling worthless.  Feeling guilty.  Trouble making decisions.  Trouble thinking clearly.  Thoughts of suicide or harming others.  Feeling weak.  Feeling agitated.  Keeping yourself from being around other people (isolation).  Follow these instructions at home: Activity  Do these things as told by your doctor: ? Go back to your normal activities. ? Exercise regularly. ? Spend time outdoors. Alcohol  Talk with your doctor about how alcohol can affect your antidepressant medicines.  Do not drink alcohol. Or, limit how much alcohol you drink. ? This means no more than 1 drink a day for nonpregnant women and 2 drinks a day for men. One drink equals one of these:  12 oz of beer.  5 oz of wine.  1 oz of hard liquor. General instructions  Take over-the-counter and prescription medicines only as told by your doctor.  Eat a healthy diet.  Get plenty of sleep.  Find activities that you enjoy. Make time to do them.  Think about joining a support group. Your doctor may be able to suggest a group for you.  Keep all follow-up visits as told by your doctor. This is important. Where to find more information:  [employer] on Mental Illness: ? www.nami.[place]: ? https://carter.com/  National Suicide Prevention Lifeline: ? 785-031-0732. This is free, 24-hour help. Contact a  doctor if:  Your symptoms get worse.  You have new symptoms. Get help right away if:  You self-harm.  You see, hear, taste, smell, or feel things that are not present (hallucinate). If you ever feel like you may hurt yourself or others, or have thoughts about taking your own life, get help right away. You can go to your nearest emergency department or call:  Your local emergency services (911 in the [place]  A suicide crisis helpline, such as the National Suicide Prevention Lifeline: ? (539)876-9356. This is open 24 hours a day.  This information is not intended to replace advice given to you by your health care provider. Make sure you discuss any questions you have with your health care provider. Document Released: 12/07/2014 Document Revised: 09/12/2015 Document Reviewed: 09/12/2015 Elsevier Interactive Patient Education  2017 [employer].

## 2017-06-21 NOTE — Progress Notes (Addendum)
David Hendricks - 67 y.o. male MRN [id number]  Date of birth: April 30, 1950  Subjective Chief Complaint  Patient presents with  . Follow-up    follow up/depression and tramadol-pain consult/ still smoking--alot of stress/ denied tdap    HPI David Hendricks is a 67 y.o. male here today for a follow up visit  -Depression/Anxiety:  Worsening symptoms of depression and anxiety.  States that he feels like "he just can't get things together".  Lives in a one bedroom apartment and has trouble keeping this up as he just doesn't feel motivated to.  Feels lonely often, closest family is in [place], [place].  He is taking bupropion as directed, unsure if this has been helpful.  He has been unsuccessful with cutting back or quitting smoking with Chantix.  He reports that he is not sleeping well and will often stay up thinking about things for several hours each night.  He would be interested in counseling.   Depression screen [facility] 2/9 06/21/2017 05/10/2017 08/24/2016  Decreased Interest 2 0 0  Down, Depressed, Hopeless 0 0 0  PHQ - 2 Score 2 0 0  Altered sleeping 3 - -  Tired, decreased energy 3 - -  Change in appetite 0 - -  Feeling bad or failure about yourself  3 - -  Trouble concentrating 0 - -  Moving slowly or fidgety/restless 0 - -  Suicidal thoughts 0 - -  PHQ-9 Score 11 - -   GAD 7 : Generalized Anxiety Score 06/21/2017  Nervous, Anxious, on Edge 3  Control/stop worrying 0  Worry too much - different things 3  Trouble relaxing 3  Restless 0  Easily annoyed or irritable 2  Afraid - awful might happen 3  Total GAD 7 Score 14    -Shoulder pain:  Chronic R shoulder pain, seen by Dr. Raeford Razor last month and had shoulder injection.  Reports shoulder is doing well at this time.   -Rib pain:  Pain in lower left rib area with radiation into upper ribs.  Was seen at hospital recently as well and had CXR and CT abdomen completed that did not show anything too concerning.  His pain is improved with naproxen.   Also has some left over tramadol at home from shoulder pain.  Denies increased shortness of breath, palpitations, nausea or vomiting.   -Vertigo:  Reports that vertigo symptoms has resolved at this point.  Denies dizziness, ear fullness or nasal congestion at this time.  Continue on flonase.    ROS:  ROS completed and negative except as noted per HPI No Known Allergies  Past Medical History:  Diagnosis Date  . Anxiety   . Arthritis   . Coronary artery disease    10/18 PCI/DES to pLAD, mild nonobstructive disease in the Lcx/RCA. Normal EF.   Marland Kitchen Dysrhythmia 2016   irregular heartbeat  . Myocardial infarction ([place])   . Seasonal allergies     Past Surgical History:  Procedure Laterality Date  . CORONARY ANGIOPLASTY  10/09/2016  . CORONARY STENT INTERVENTION N/A 10/09/2016   Procedure: CORONARY STENT INTERVENTION;  Surgeon: Nelva Bush, MD;  Location: [place] [facility];  Service: Cardiovascular;  Laterality: N/A;  . INTRAVASCULAR ULTRASOUND/IVUS N/A 10/09/2016   Procedure: Intravascular Ultrasound/IVUS;  Surgeon: Nelva Bush, MD;  Location: [place] [facility];  Service: Cardiovascular;  Laterality: N/A;  . LEFT HEART CATH AND CORONARY ANGIOGRAPHY N/A 10/09/2016   Procedure: LEFT HEART CATH AND CORONARY ANGIOGRAPHY;  Surgeon: Nelva Bush, MD;  Location: [facility]  INVASIVE CV LAB;  Service: Cardiovascular;  Laterality: N/A;    Social History   Socioeconomic History  . Marital status: Divorced    Spouse name: Not on file  . Number of children: Not on file  . Years of education: Not on file  . Highest education level: Not on file  Occupational History  . Not on file  Social Needs  . Financial resource strain: Not on file  . Food insecurity:    Worry: Not on file    Inability: Not on file  . Transportation needs:    Medical: Not on file    Non-medical: Not on file  Tobacco Use  . Smoking status: Current Every Day Smoker    Packs/day: 2.00    Years: 45.00    Pack years:  90.00    Types: Cigarettes    Start date: 01/09/1961  . Smokeless tobacco: Never Used  . Tobacco comment: thinking about after settling in   Substance and Sexual Activity  . Alcohol use: No  . Drug use: No  . Sexual activity: Not on file  Lifestyle  . Physical activity:    Days per week: Not on file    Minutes per session: Not on file  . Stress: Not on file  Relationships  . Social connections:    Talks on phone: Not on file    Gets together: Not on file    Attends religious service: Not on file    Active member of club or organization: Not on file    Attends meetings of clubs or organizations: Not on file    Relationship status: Not on file  Other Topics Concern  . Not on file  Social History Narrative  . Not on file    Family History  Problem Relation Age of Onset  . Cancer Father     Health Maintenance  Topic Date Due  . Hepatitis C Screening  1950/09/09  . TETANUS/TDAP  05/29/1968  . INFLUENZA VACCINE  08/09/2017  . PNA vac Low Risk Adult (2 of 2 - PPSV23) 05/11/2018  . COLONOSCOPY  04/20/2026    ----------------------------------------------------------------------------------------------------------------------------------------------------------------------------------------------------------------- Physical Exam BP (!) 112/56   Pulse 88   Temp 98.2 F (36.8 C) (Oral)   Ht 6\' 1"  (1.854 m)   Wt 204 lb 12.8 oz (92.9 kg)   SpO2 95%   BMI 27.02 kg/m   Physical Exam  Constitutional: He is oriented to person, place, and time. He appears well-nourished. No distress.  HENT:  Head: Normocephalic and atraumatic.  Mouth/Throat: Oropharynx is clear and moist.  Eyes: No scleral icterus.  Neck: Neck supple. No thyromegaly present.  Cardiovascular: Normal rate, regular rhythm and normal heart sounds.  Pulmonary/Chest: Effort normal and breath sounds normal.  Abdominal: Soft. Bowel sounds are normal. He exhibits no distension. There is no tenderness.    Musculoskeletal:  Prominence of lower ribs on L compared to right with mild ttp at costochondral junction. No scoliosis noted.   Lymphadenopathy:    He has no cervical adenopathy.  Neurological: He is alert and oriented to person, place, and time.  Skin: No rash noted.  Psychiatric: Judgment and thought content normal.  Unkempt appearing.  Depressed affect, tearful at times.     ------------------------------------------------------------------------------------------------------------------------------------------------------------------------------------------------------------------- Assessment and Plan  Vertigo This has resolved at this time He will monitor for recurrence of any symptoms.   Right shoulder pain Significant improvement since injection by Dr. Raeford Razor.   Rib pain on left side Likely costochondritis, prominence noted on L  side.  Recent imaging reviewed, no abnormality noted.  Rx for meloxicam Heating pad may be helpful as well.   Depression, major, single episode, mild (HCC) Worsening depression.  Insomnia seems to be most bothersome symptom he is having Continue bupropion Rx for trazodone Referral for psychotherapy.   Cigarette nicotine dependence without complication Ambiguous about quitting at this time due to stress level. Encouraged to try to continue to cut back with goal of quitting. We will continue to address this with him and his next follow-up in 6 weeks. ~13min spent counseling.

## 2017-06-25 ENCOUNTER — Telehealth: Payer: Self-pay | Admitting: Family Medicine

## 2017-06-25 NOTE — Telephone Encounter (Signed)
Pt stated he took 1/2 tab  2 nights and both night he had the same nightmares, felt weird. Please advise.    Copied from [place] [id number]. Topic: General - Deceased Patient >> 07-08-2017  2:58 PM Yvette Rack wrote: Reason for CRM: pt states that the traZODone (DESYREL) 100 MG tablet gives him nightmares it been for two nights  Route to department's PEC Pool.

## 2017-06-26 NOTE — Telephone Encounter (Signed)
Pt is aware.  

## 2017-06-26 NOTE — Telephone Encounter (Signed)
Have him try one more time, if still with side effects we'll try something different.

## 2017-06-26 NOTE — Telephone Encounter (Signed)
Left a vm for the pt to call back, need to inform of message below.

## 2017-07-27 ENCOUNTER — Emergency Department (HOSPITAL_COMMUNITY): Payer: Medicare Other

## 2017-07-27 ENCOUNTER — Encounter (HOSPITAL_COMMUNITY): Payer: Self-pay

## 2017-07-27 ENCOUNTER — Emergency Department (HOSPITAL_COMMUNITY)
Admission: EM | Admit: 2017-07-27 | Discharge: 2017-07-27 | Disposition: A | Discharge status: Home / Self Care | Payer: Medicare Other | Attending: Emergency Medicine | Admitting: Emergency Medicine

## 2017-07-27 DIAGNOSIS — Z7982 Long term (current) use of aspirin: Secondary | ICD-10-CM | POA: Diagnosis not present

## 2017-07-27 DIAGNOSIS — Z79899 Other long term (current) drug therapy: Secondary | ICD-10-CM | POA: Diagnosis not present

## 2017-07-27 DIAGNOSIS — I2510 Atherosclerotic heart disease of native coronary artery without angina pectoris: Secondary | ICD-10-CM | POA: Insufficient documentation

## 2017-07-27 DIAGNOSIS — E161 Other hypoglycemia: Secondary | ICD-10-CM | POA: Diagnosis not present

## 2017-07-27 DIAGNOSIS — Z955 Presence of coronary angioplasty implant and graft: Secondary | ICD-10-CM | POA: Diagnosis not present

## 2017-07-27 DIAGNOSIS — F17210 Nicotine dependence, cigarettes, uncomplicated: Secondary | ICD-10-CM | POA: Diagnosis not present

## 2017-07-27 DIAGNOSIS — I252 Old myocardial infarction: Secondary | ICD-10-CM | POA: Insufficient documentation

## 2017-07-27 DIAGNOSIS — I444 Left anterior fascicular block: Secondary | ICD-10-CM | POA: Diagnosis not present

## 2017-07-27 DIAGNOSIS — F419 Anxiety disorder, unspecified: Secondary | ICD-10-CM | POA: Insufficient documentation

## 2017-07-27 DIAGNOSIS — R55 Syncope and collapse: Secondary | ICD-10-CM | POA: Insufficient documentation

## 2017-07-27 DIAGNOSIS — M533 Sacrococcygeal disorders, not elsewhere classified: Secondary | ICD-10-CM | POA: Diagnosis not present

## 2017-07-27 DIAGNOSIS — E162 Hypoglycemia, unspecified: Secondary | ICD-10-CM | POA: Diagnosis not present

## 2017-07-27 DIAGNOSIS — R42 Dizziness and giddiness: Secondary | ICD-10-CM | POA: Diagnosis not present

## 2017-07-27 DIAGNOSIS — R0902 Hypoxemia: Secondary | ICD-10-CM | POA: Diagnosis not present

## 2017-07-27 LAB — COMPREHENSIVE METABOLIC PANEL
ALK PHOS: 98 U/L (ref 38–126)
ALT: 11 U/L (ref 0–44)
ANION GAP: 9 (ref 5–15)
AST: 15 U/L (ref 15–41)
Albumin: 3.6 g/dL (ref 3.5–5.0)
BUN: 11 mg/dL (ref 8–23)
CO2: 28 mmol/L (ref 22–32)
Calcium: 9 mg/dL (ref 8.9–10.3)
Chloride: 104 mmol/L (ref 98–111)
Creatinine, Ser: 1.01 mg/dL (ref 0.61–1.24)
Glucose, Bld: 84 mg/dL (ref 70–99)
Potassium: 4.2 mmol/L (ref 3.5–5.1)
SODIUM: 141 mmol/L (ref 135–145)
Total Bilirubin: 0.6 mg/dL (ref 0.3–1.2)
Total Protein: 6 g/dL — ABNORMAL LOW (ref 6.5–8.1)

## 2017-07-27 LAB — CBC WITH DIFFERENTIAL/PLATELET
ABS IMMATURE GRANULOCYTES: 0 10*3/uL (ref 0.0–0.1)
BASOS ABS: 0.1 10*3/uL (ref 0.0–0.1)
BASOS PCT: 1 %
Eosinophils Absolute: 0.3 10*3/uL (ref 0.0–0.7)
Eosinophils Relative: 3 %
HCT: 51.4 % (ref 39.0–52.0)
Hemoglobin: 17 g/dL (ref 13.0–17.0)
Immature Granulocytes: 0 %
LYMPHS ABS: 2.7 10*3/uL (ref 0.7–4.0)
LYMPHS PCT: 29 %
MCH: 28.7 pg (ref 26.0–34.0)
MCHC: 33.1 g/dL (ref 30.0–36.0)
MCV: 86.7 fL (ref 78.0–100.0)
MONOS PCT: 6 %
Monocytes Absolute: 0.6 10*3/uL (ref 0.1–1.0)
NEUTROS ABS: 5.5 10*3/uL (ref 1.7–7.7)
NEUTROS PCT: 61 %
PLATELETS: 158 10*3/uL (ref 150–400)
RBC: 5.93 MIL/uL — ABNORMAL HIGH (ref 4.22–5.81)
RDW: 13.3 % (ref 11.5–15.5)
WBC: 9.2 10*3/uL (ref 4.0–10.5)

## 2017-07-27 LAB — URINALYSIS, ROUTINE W REFLEX MICROSCOPIC
BILIRUBIN URINE: NEGATIVE
Glucose, UA: NEGATIVE mg/dL
Hgb urine dipstick: NEGATIVE
KETONES UR: NEGATIVE mg/dL
LEUKOCYTES UA: NEGATIVE
NITRITE: NEGATIVE
PROTEIN: NEGATIVE mg/dL
Specific Gravity, Urine: 1.02 (ref 1.005–1.030)
pH: 5 (ref 5.0–8.0)

## 2017-07-27 LAB — CBG MONITORING, ED: Glucose-Capillary: 115 mg/dL — ABNORMAL HIGH (ref 70–99)

## 2017-07-27 LAB — TROPONIN I

## 2017-07-27 NOTE — ED Notes (Signed)
ED Provider at bedside. 

## 2017-07-27 NOTE — ED Triage Notes (Signed)
Pt BIB ems with reports of syncopal event. Per EMS pt reports feeling bad last night. Today pt was doing laundry, felt dizzy and then had a syncopal event lasting approx 2 minutes. Pt with hx MI a year ago. EKG unremarkable. 124/66, HR 73, 95% 2L [place] (90% RA). Pt initially diaphoretic on scene. Pt denies blood thinners.

## 2017-07-27 NOTE — Progress Notes (Signed)
CSW provided taxi voucher for when pt is cleared for discharge.   Wendelyn Breslow, [facility]  [phone]

## 2017-07-27 NOTE — ED Notes (Signed)
Patient transported to X-ray 

## 2017-07-27 NOTE — ED Provider Notes (Signed)
[facility] EMERGENCY DEPARTMENT Provider Note   CSN: [id number] Arrival date & time: 07/27/17  1701     History   Chief Complaint Chief Complaint  Patient presents with  . Loss of Consciousness    HPI David Hendricks is a 67 y.o. male with a history of CAD, MI, and STEMI, and vertigo who presents to the emergency department by EMS with a chief complaint of syncope.  The patient reports that he was at the [place] doing laundry when he suddenly felt lightheaded and syncopized.  The episode was witnessed and lasted approximately 2 minutes.  When EMS arrived, EKG was unremarkable and the patient was hemodynamically stable.  He was initially diaphoretic on the scene.  The patient reports that he remembers falling and thinks that he hit his tailbone on a door of 1 of the washing machines.  He denies hitting his head.  He states that last night that he was feeling somewhat unwell at his home.  He reports that he has had pressure and tightness over the left flank area that extends from the abdomen up the left side of his neck for several weeks.  He states this is been worked up several times, but nobody is able to tell him what is wrong.  He also reports that he stopped taking all of his home medications a proximally 1 month ago after having an episode of vertigo.  He states that all of his medications made him feel more dizzy.  He states that he was given a medication for his dizziness, but it gave him nightmares so he had to stop taking it.  He states I have been here several times with similar symptoms over the last few months, but no one is ever able to ever tell me what is wrong.  The history is provided by the patient. No language interpreter was used.    Past Medical History:  Diagnosis Date  . Anxiety   . Arthritis   . Coronary artery disease    10/18 PCI/DES to pLAD, mild nonobstructive disease in the Lcx/RCA. Normal EF.   Marland Kitchen Dysrhythmia 2016   irregular  heartbeat  . Myocardial infarction ([place])   . Seasonal allergies     Patient Active Problem List   Diagnosis Date Noted  . Depression, major, single episode, mild ([place]) 06/21/2017  . Rib pain on left side 06/21/2017  . Pain of finger of left hand 05/21/2017  . Cigarette nicotine dependence without complication 61/44/3154  . Vertigo 05/10/2017  . Old MI (myocardial infarction) 01/30/2017  . CAD (coronary artery disease) 01/30/2017  . Chest pain with moderate risk of acute coronary syndrome 01/23/2017  . Hyperlipidemia 10/10/2016  . History of non-ST elevation myocardial infarction (NSTEMI) 10/09/2016  . Difficulty sleeping 04/22/2016  . Urinary dysfunction 04/22/2016  . Right shoulder pain 02/11/2016    Past Surgical History:  Procedure Laterality Date  . CORONARY ANGIOPLASTY  10/09/2016  . CORONARY STENT INTERVENTION N/A 10/09/2016   Procedure: CORONARY STENT INTERVENTION;  Surgeon: Nelva Bush, MD;  Location: [place] [facility];  Service: Cardiovascular;  Laterality: N/A;  . INTRAVASCULAR ULTRASOUND/IVUS N/A 10/09/2016   Procedure: Intravascular Ultrasound/IVUS;  Surgeon: Nelva Bush, MD;  Location: [place] [facility];  Service: Cardiovascular;  Laterality: N/A;  . LEFT HEART CATH AND CORONARY ANGIOGRAPHY N/A 10/09/2016   Procedure: LEFT HEART CATH AND CORONARY ANGIOGRAPHY;  Surgeon: Nelva Bush, MD;  Location: [place] [facility];  Service: Cardiovascular;  Laterality: N/A;  Home Medications    Prior to Admission medications   Medication Sig Start Date End Date Taking? Authorizing Provider  aspirin 81 MG EC tablet Take 1 tablet (81 mg total) by mouth daily. 10/10/16  Yes Cheryln Manly, NP  atorvastatin (LIPITOR) 80 MG tablet Take 1 tablet (80 mg total) by mouth daily at 6 PM. 10/10/16  Yes Cheryln Manly, NP  buPROPion [facility] SR) 150 MG 12 hr tablet Take 2 tablets (300 mg total) by mouth daily. 01/24/17 01/24/18 Yes Vann, Jessica U, DO    fluticasone (FLONASE) 50 MCG/ACT nasal spray Place 2 sprays into both nostrils daily. 05/10/17  Yes [name], DO  meclizine (ANTIVERT) 25 MG tablet Take 1 tablet (25 mg total) by mouth 3 (three) times daily as needed for dizziness. 05/10/17  Yes [name], DO  meloxicam (MOBIC) 15 MG tablet TAKE 1 TABLET(15 MG) BY MOUTH DAILY. DO NOT TAKE WITH ALEVE OR IBUPROFEN 06/21/17  Yes [name], DO  metoprolol tartrate (LOPRESSOR) 25 MG tablet Take 0.5 tablets (12.5 mg total) by mouth 2 (two) times daily. 01/30/17  Yes [name], MD  nitroGLYCERIN (NITROSTAT) 0.4 MG SL tablet Place 1 tablet (0.4 mg total) under the tongue every 5 (five) minutes x 3 doses as needed for chest pain. 01/24/17  Yes Eulogio Bear U, DO  tamsulosin (FLOMAX) 0.4 MG CAPS capsule Take 2 capsules (0.8 mg total) by mouth daily. 02/11/16  Yes Ledell Noss, MD  ticagrelor (BRILINTA) 90 MG TABS tablet Take 1 tablet (90 mg total) by mouth 2 (two) times daily. 10/10/16  Yes Cheryln Manly, NP  traZODone (DESYREL) 100 MG tablet Take 0.5-1 tablets (50-100 mg total) by mouth at bedtime as needed for sleep. 06/21/17  Yes [name], DO  triamcinolone cream (KENALOG) 0.1 % APPLY TO THE AFFECTED AREA ON THE SKIN BID 03/16/17  Yes [provider]  varenicline (CHANTIX STARTING MONTH PAK) 0.5 MG X 11 & 1 MG X 42 tablet Take one 0.5 mg tablet PO once daily  x3 days, then increase to one 0.5 mg tab BID for 4 days, then increase to one 1 mg tab BID. 05/10/17  Yes [name], DO    Family History Family History  Problem Relation Age of Onset  . Cancer Father     Social History Social History   Tobacco Use  . Smoking status: Current Every Day Smoker    Packs/day: 2.00    Years: 45.00    Pack years: 90.00    Types: Cigarettes    Start date: 01/09/1961  . Smokeless tobacco: Never Used  . Tobacco comment: thinking about after settling in   Substance Use Topics  . Alcohol use: No  . Drug use: No      Allergies   Patient has no known allergies.   Review of Systems Review of Systems  Constitutional: Negative for appetite change, chills and fever.  Eyes: Negative for visual disturbance.  Respiratory: Negative for shortness of breath.   Cardiovascular: Negative for chest pain, palpitations and leg swelling.  Gastrointestinal: Negative for abdominal pain, diarrhea and vomiting.  Genitourinary: Negative for dysuria.  Musculoskeletal: Negative for back pain, myalgias, neck pain and neck stiffness.  Skin: Negative for rash.  Allergic/Immunologic: Negative for immunocompromised state.  Neurological: Positive for syncope and light-headedness. Negative for dizziness, weakness, numbness and headaches.  Psychiatric/Behavioral: Negative for confusion.    Physical Exam Updated Vital Signs BP 114/76 (BP Location: Right Arm)   Pulse 64  Temp 97.9 F (36.6 C) (Oral)   Resp 20   Ht 5\' 11"  (1.803 m)   Wt 83.9 kg (185 lb)   SpO2 96%   BMI 25.80 kg/m   Physical Exam  Constitutional: He appears well-developed. No distress.  HENT:  Head: Normocephalic.  Eyes: Conjunctivae are normal.  Neck: Normal range of motion. Neck supple. No JVD present. No tracheal deviation present.  Cardiovascular: Normal rate, regular rhythm, normal heart sounds and intact distal pulses. Exam reveals no gallop and no friction rub.  No murmur heard. Pulses:      Carotid pulses are 2+ on the right side, and 2+ on the left side.      Radial pulses are 2+ on the right side, and 2+ on the left side.       Dorsalis pedis pulses are 2+ on the right side, and 2+ on the left side.       Posterior tibial pulses are 2+ on the right side, and 2+ on the left side.  Pulmonary/Chest: Effort normal. No stridor. No respiratory distress. He has no wheezes. He has no rales. He exhibits no tenderness.  Abdominal: Soft. Bowel sounds are normal. He exhibits no distension and no mass. There is no tenderness. There is no rebound  and no guarding. No hernia.  Musculoskeletal: Normal range of motion. He exhibits no edema, tenderness or deformity.  Neurological: He is alert.  Cranial nerves II through XII are grossly intact.  GCS 15.  Alert and oriented x3.  5 out of 5 strength against resistance to bilateral upper and lower extremities.  Sensation is intact and symmetric throughout.  Finger-to-nose is intact bilaterally.  No pronator drift.  Skin: Skin is warm and dry. He is not diaphoretic.  Psychiatric: His behavior is normal. He exhibits a depressed mood.  Nursing note and vitals reviewed.  ED Treatments / Results  Labs (all labs ordered are listed, but only abnormal results are displayed) Labs Reviewed  CBC WITH DIFFERENTIAL/PLATELET - Abnormal; Notable for the following components:      Result Value   RBC 5.93 (*)    All other components within normal limits  COMPREHENSIVE METABOLIC PANEL - Abnormal; Notable for the following components:   Total Protein 6.0 (*)    All other components within normal limits  CBG MONITORING, ED - Abnormal; Notable for the following components:   Glucose-Capillary 115 (*)    All other components within normal limits  URINALYSIS, ROUTINE W REFLEX MICROSCOPIC  TROPONIN I  TROPONIN I    EKG EKG Interpretation  Date/Time:  Friday July 27 2017 17:12:22 EDT Ventricular Rate:  72 PR Interval:    QRS Duration: 115 QT Interval:  395 QTC Calculation: 433 R Axis:   -68 Text Interpretation:  Sinus rhythm 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 Tailbone pain. EXAM: SACRUM AND COCCYX - 2+ VIEW COMPARISON:  CT 06/12/2017 FINDINGS: The included bony pelvis appears intact. No pelvic diastasis is seen. The arcuate lines of the sacrum appear intact without  disruption. No presacral soft tissue swelling or displacement of overlying bowel to suggest a hematoma. The sacrum and coccyx are unremarkable. Atherosclerotic calcifications of the common iliac arteries. IMPRESSION: No acute fracture of the sacrum or coccyx. Electronically Signed   By: Meredith Leeds.D.  On: 07/27/2017 20:26    Procedures Procedures (including critical care time)  Medications Ordered in ED Medications - No data to display   Initial Impression / Assessment and Plan / ED Course  I have reviewed the triage vital signs and the nursing notes.  Pertinent labs & imaging results that were available during my care of the patient were reviewed by me and considered in my medical decision making (see chart for details).     67 year old male with a history of CAD, MI, and STEMI, and vertigo who presents to the emergency department by EMS with a chief complaint of syncope.  EKG unchanged from previous.  No orthostatic hypotension.  Troponin was negative.  Labs are otherwise reassuring.  X-ray of the sacrum and coccyx is negative.  The patient was seen and evaluated by Dr. Verner Chol, attending physician.  After his exam and reviewing the work-up, he did not feel that any other emergent work-up is indicated at this time.  I discussed all lab results with the patient.  I have urged him to follow-up with primary care next week.  It appears in epic that he has a appointment scheduled on July 25.  Strict return precautions given.  He is hemodynamically stable and in no acute distress.  He is safe for discharge home at this time.  Final Clinical Impressions(s) / ED Diagnoses   Final diagnoses:  Syncope, unspecified syncope type    ED Discharge Orders    None       Joanne Gavel, PA-C 07/27/17 2353    Sherwood Gambler, MD 07/29/17 1030

## 2017-07-27 NOTE — ED Notes (Signed)
Pt given cab voucher by CSW d/t pt having been brought in by EMS without wallet. Pt departed in NAD.

## 2017-07-27 NOTE — Discharge Instructions (Addendum)
Thank you for allowing me to care for you today in the Emergency Department.   Your labs and work-up today were reassuring.  Please call your primary care provider's office when they reopen on Monday to schedule a follow-up appointment.  Return to the emergency department if you have severe chest pain, shortness of breath, the worst headache of your life, new numbness or weakness, facial drooping, slurred speech, or other new, concerning symptoms.

## 2017-08-02 ENCOUNTER — Ambulatory Visit: Payer: Medicare Other | Admitting: Family Medicine

## 2017-09-03 ENCOUNTER — Ambulatory Visit (INDEPENDENT_AMBULATORY_CARE_PROVIDER_SITE_OTHER): Payer: Medicare Other

## 2017-09-03 ENCOUNTER — Ambulatory Visit (INDEPENDENT_AMBULATORY_CARE_PROVIDER_SITE_OTHER): Payer: Medicare Other | Admitting: Family Medicine

## 2017-09-03 ENCOUNTER — Encounter: Payer: Self-pay | Admitting: Family Medicine

## 2017-09-03 VITALS — BP 130/68 | HR 87 | Ht 71.0 in | Wt 205.0 lb

## 2017-09-03 DIAGNOSIS — M79645 Pain in left finger(s): Secondary | ICD-10-CM | POA: Diagnosis not present

## 2017-09-03 DIAGNOSIS — M7551 Bursitis of right shoulder: Secondary | ICD-10-CM | POA: Diagnosis not present

## 2017-09-03 NOTE — Progress Notes (Signed)
David Hendricks - 67 y.o. male MRN [id number]  Date of birth: 07/23/1950  SUBJECTIVE:  Including CC & ROS.  Chief Complaint  Patient presents with  . Follow-up    David Hendricks is a 67 y.o. male that is here today for right shoulder pain. He was seen on 05/21/17, received an injection with some improvement. Pain located in the posterior aspect. Denies tingling or numbness. He has been taking Aleve for the pain. The pain is localized to his shoulder.  Denies any radicular symptoms.  He denies any inciting event.  The pain is worse when he lies on the unaffected side. He has been experiencing intermittent pain and locking sensation at his left index finger. has pain at the dorsal aspect of the second joint.    Review of Systems  Constitutional: Negative for fever.  HENT: Negative for congestion.   Respiratory: Negative for cough.   Cardiovascular: Negative for chest pain.  Gastrointestinal: Negative for abdominal pain.  Musculoskeletal: Negative for joint swelling.  Skin: Negative for color change.  Neurological: Negative for weakness.  Hematological: Negative for adenopathy.  Psychiatric/Behavioral: Negative for agitation.    HISTORY: Past Medical, Surgical, Social, and Family History Reviewed & Updated per EMR.   Pertinent Historical Findings include:  Past Medical History:  Diagnosis Date  . Anxiety   . Arthritis   . Coronary artery disease    10/18 PCI/DES to pLAD, mild nonobstructive disease in the Lcx/RCA. Normal EF.   Marland Kitchen Dysrhythmia 2016   irregular heartbeat  . Myocardial infarction ([place])   . Seasonal allergies     Past Surgical History:  Procedure Laterality Date  . CORONARY ANGIOPLASTY  10/09/2016  . CORONARY STENT INTERVENTION N/A 10/09/2016   Procedure: CORONARY STENT INTERVENTION;  Surgeon: Nelva Bush, MD;  Location: [facility];  Service: Cardiovascular;  Laterality: N/A;  . INTRAVASCULAR ULTRASOUND/IVUS N/A 10/09/2016   Procedure: Intravascular  Ultrasound/IVUS;  Surgeon: Nelva Bush, MD;  Location: [facility];  Service: Cardiovascular;  Laterality: N/A;  . LEFT HEART CATH AND CORONARY ANGIOGRAPHY N/A 10/09/2016   Procedure: LEFT HEART CATH AND CORONARY ANGIOGRAPHY;  Surgeon: Nelva Bush, MD;  Location: [facility];  Service: Cardiovascular;  Laterality: N/A;    No Known Allergies  Family History  Problem Relation Age of Onset  . Cancer Father      Social History   Socioeconomic History  . Marital status: Divorced    Spouse name: Not on file  . Number of children: Not on file  . Years of education: Not on file  . Highest education level: Not on file  Occupational History  . Not on file  Social Needs  . Financial resource strain: Not on file  . Food insecurity:    Worry: Not on file    Inability: Not on file  . Transportation needs:    Medical: Not on file    Non-medical: Not on file  Tobacco Use  . Smoking status: Current Every Day Smoker    Packs/day: 2.00    Years: 45.00    Pack years: 90.00    Types: Cigarettes    Start date: 01/09/1961  . Smokeless tobacco: Never Used  . Tobacco comment: thinking about after settling in   Substance and Sexual Activity  . Alcohol use: No  . Drug use: No  . Sexual activity: Not on file  Lifestyle  . Physical activity:    Days per week: Not on file    Minutes per session:  Not on file  . Stress: Not on file  Relationships  . Social connections:    Talks on phone: Not on file    Gets together: Not on file    Attends religious service: Not on file    Active member of club or organization: Not on file    Attends meetings of clubs or organizations: Not on file    Relationship status: Not on file  . Intimate partner violence:    Fear of current or ex partner: Not on file    Emotionally abused: Not on file    Physically abused: Not on file    Forced sexual activity: Not on file  Other Topics Concern  . Not on file  Social History Narrative  .  Not on file     PHYSICAL EXAM:  VS: BP 130/68 (BP Location: Left Arm, Patient Position: Sitting, Cuff Size: Normal)   Pulse 87   Ht 5\' 11"  (1.803 m)   Wt 205 lb (93 kg)   SpO2 98%   BMI 28.59 kg/m  Physical Exam Gen: NAD, alert, cooperative with exam, well-appearing ENT: normal lips, normal nasal mucosa,  Eye: normal EOM, normal conjunctiva and lids CV:  no edema, +2 pedal pulses   Resp: no accessory muscle use, non-labored,  Skin: no rashes, no areas of induration  Neuro: normal tone, normal sensation to touch Psych:  normal insight, alert and oriented MSK:  Right Shoulder: Inspection reveals no abnormalities, atrophy or asymmetry. Palpation is normal with no tenderness over AC joint . ROM is full in all planes. Rotator cuff strength normal throughout. Mild pain with Hawkin's tests, empty can sign. Speeds  tests normal. negative Obrien's, negative clunk and good stability. Normal scapular function observed. Neurovascularly intact.    Aspiration/Injection Procedure Note David Hendricks 08-May-1950  Procedure: Injection Indications: right shoulder pain   Procedure Details Consent: Risks of procedure as well as the alternatives and risks of each were explained to the (patient/caregiver).  Consent for procedure obtained. Time Out: Verified patient identification, verified procedure, site/side was marked, verified correct patient position, special equipment/implants available, medications/allergies/relevent history reviewed, required imaging and test results available.  Performed.  The area was cleaned with iodine and alcohol swabs.    The right subacromial bursa was injected using 1 cc's of 40 mg Depomedrol and 4 cc's of 1% bupivacaine with a 25 1 1/2" needle.  Ultrasound was used. Images were obtained in  Long views showing the injection.    A sterile dressing was applied.  Patient did tolerate procedure well.     ASSESSMENT & PLAN:   Subacromial bursitis of right  shoulder joint Had an acute on chronic exacerbation of the pain. -Subacromial injection today. -Counseled on home exercise therapy. -Counseled on supportive therapy. -We will try to focus on physical therapy going forward to cut down on the amount of injections around his shoulder.  Pain of finger of left hand Has trouble with complete flexion still. -If no improvement may need to consider referral to physical therapy.

## 2017-09-03 NOTE — Patient Instructions (Signed)
Good to see you  Please continue the exercises  Please see me back if you would to consider physical therapy for your shoulder or your finger.

## 2017-09-04 NOTE — Assessment & Plan Note (Signed)
Had an acute on chronic exacerbation of the pain. -Subacromial injection today. -Counseled on home exercise therapy. -Counseled on supportive therapy. -We will try to focus on physical therapy going forward to cut down on the amount of injections around his shoulder.

## 2017-09-04 NOTE — Assessment & Plan Note (Signed)
Has trouble with complete flexion still. -If no improvement may need to consider referral to physical therapy.

## 2017-10-16 DIAGNOSIS — H04123 Dry eye syndrome of bilateral lacrimal glands: Secondary | ICD-10-CM | POA: Diagnosis not present

## 2018-02-06 ENCOUNTER — Encounter (HOSPITAL_COMMUNITY): Payer: Self-pay

## 2018-02-06 ENCOUNTER — Ambulatory Visit (HOSPITAL_COMMUNITY)
Admission: EM | Admit: 2018-02-06 | Discharge: 2018-02-06 | Disposition: A | Discharge status: Home / Self Care | Payer: Medicare Other | Attending: Family Medicine | Admitting: Family Medicine

## 2018-02-06 DIAGNOSIS — M5441 Lumbago with sciatica, right side: Secondary | ICD-10-CM | POA: Insufficient documentation

## 2018-02-06 DIAGNOSIS — M542 Cervicalgia: Secondary | ICD-10-CM | POA: Insufficient documentation

## 2018-02-06 DIAGNOSIS — M25511 Pain in right shoulder: Secondary | ICD-10-CM | POA: Insufficient documentation

## 2018-02-06 DIAGNOSIS — G8929 Other chronic pain: Secondary | ICD-10-CM | POA: Diagnosis not present

## 2018-02-06 DIAGNOSIS — M5442 Lumbago with sciatica, left side: Secondary | ICD-10-CM | POA: Insufficient documentation

## 2018-02-06 MED ORDER — PREDNISONE 20 MG PO TABS: 20.0000 mg | ORAL_TABLET | Freq: Two times a day (BID) | ORAL | 0 refills | Status: AC

## 2018-02-06 NOTE — ED Triage Notes (Signed)
Pt c/o rt shoulder/neck/rt back pain x46month, denies injury. States has saw multiple doctors and still nothing has helped

## 2018-02-06 NOTE — ED Provider Notes (Signed)
[facility]   [id number] 02/06/18 Arrival Time: 9323  CC: Neck, right shoulder, and back pain  SUBJECTIVE: History from: patient. David Hendricks is a 68 y.o. male complains of neck, right shoulder, and low back pain that began 1 month ago.  Denies a precipitating event or specific injury.  Localizes the pain to the posterior neck, and low back.  Describes the pain as intermittent and sharp in character.  Has tried OTC medications without relief.  Symptoms are made worse with moving his right hand.  Has been seen by four other providers since symptoms began.  One provider recommended exercise, but patient denies improvement in symptoms with exercise.  Has been treated with tramadol in the past.  Denies fever, chills, erythema, ecchymosis, effusion, weakness, numbness and tingling, saddle paresthesias, loss of bowel or bladder habits.    ROS: As per HPI.  Past Medical History:  Diagnosis Date  . Anxiety   . Arthritis   . Coronary artery disease    10/18 PCI/DES to pLAD, mild nonobstructive disease in the Lcx/RCA. Normal EF.   Marland Kitchen Dysrhythmia 2016   irregular heartbeat  . Myocardial infarction ([facility])   . Seasonal allergies    Past Surgical History:  Procedure Laterality Date  . CORONARY ANGIOPLASTY  10/09/2016  . CORONARY STENT INTERVENTION N/A 10/09/2016   Procedure: CORONARY STENT INTERVENTION;  Surgeon: Nelva Bush, MD;  Location: [facility];  Service: Cardiovascular;  Laterality: N/A;  . INTRAVASCULAR ULTRASOUND/IVUS N/A 10/09/2016   Procedure: Intravascular Ultrasound/IVUS;  Surgeon: Nelva Bush, MD;  Location: [facility];  Service: Cardiovascular;  Laterality: N/A;  . LEFT HEART CATH AND CORONARY ANGIOGRAPHY N/A 10/09/2016   Procedure: LEFT HEART CATH AND CORONARY ANGIOGRAPHY;  Surgeon: Nelva Bush, MD;  Location: [facility];  Service: Cardiovascular;  Laterality: N/A;   No Known Allergies No current facility-administered medications on  file prior to encounter.    Current Outpatient Medications on File Prior to Encounter  Medication Sig Dispense Refill  . aspirin 81 MG EC tablet Take 1 tablet (81 mg total) by mouth daily.    Marland Kitchen atorvastatin (LIPITOR) 80 MG tablet Take 1 tablet (80 mg total) by mouth daily at 6 PM. 90 tablet 1  . buPROPion (WELLBUTRIN SR) 150 MG 12 hr tablet Take 2 tablets (300 mg total) by mouth daily.    . fluticasone (FLONASE) 50 MCG/ACT nasal spray Place 2 sprays into both nostrils daily. 16 g 3  . meclizine (ANTIVERT) 25 MG tablet Take 1 tablet (25 mg total) by mouth 3 (three) times daily as needed for dizziness. 30 tablet 0  . meloxicam (MOBIC) 15 MG tablet TAKE 1 TABLET(15 MG) BY MOUTH DAILY. DO NOT TAKE WITH ALEVE OR IBUPROFEN 90 tablet 1  . metoprolol tartrate (LOPRESSOR) 25 MG tablet Take 0.5 tablets (12.5 mg total) by mouth 2 (two) times daily. 30 tablet 11  . nitroGLYCERIN (NITROSTAT) 0.4 MG SL tablet Place 1 tablet (0.4 mg total) under the tongue every 5 (five) minutes x 3 doses as needed for chest pain. 25 tablet 0  . tamsulosin (FLOMAX) 0.4 MG CAPS capsule Take 2 capsules (0.8 mg total) by mouth daily. 30 capsule 3  . ticagrelor (BRILINTA) 90 MG TABS tablet Take 1 tablet (90 mg total) by mouth 2 (two) times daily. 180 tablet 2  . traZODone (DESYREL) 100 MG tablet Take 0.5-1 tablets (50-100 mg total) by mouth at bedtime as needed for sleep. 30 tablet 2  . triamcinolone cream (KENALOG)  0.1 % APPLY TO THE AFFECTED AREA ON THE SKIN BID  2   Social History   Socioeconomic History  . Marital status: Divorced    Spouse name: Not on file  . Number of children: Not on file  . Years of education: Not on file  . Highest education level: Not on file  Occupational History  . Not on file  Social Needs  . Financial resource strain: Not on file  . Food insecurity:    Worry: Not on file    Inability: Not on file  . Transportation needs:    Medical: Not on file    Non-medical: Not on file  Tobacco Use    . Smoking status: Current Every Day Smoker    Packs/day: 2.00    Years: 45.00    Pack years: 90.00    Types: Cigarettes    Start date: 01/09/1961  . Smokeless tobacco: Never Used  . Tobacco comment: thinking about after settling in   Substance and Sexual Activity  . Alcohol use: No  . Drug use: No  . Sexual activity: Not on file  Lifestyle  . Physical activity:    Days per week: Not on file    Minutes per session: Not on file  . Stress: Not on file  Relationships  . Social connections:    Talks on phone: Not on file    Gets together: Not on file    Attends religious service: Not on file    Active member of club or organization: Not on file    Attends meetings of clubs or organizations: Not on file    Relationship status: Not on file  . Intimate partner violence:    Fear of current or ex partner: Not on file    Emotionally abused: Not on file    Physically abused: Not on file    Forced sexual activity: Not on file  Other Topics Concern  . Not on file  Social History Narrative  . Not on file   Family History  Problem Relation Age of Onset  . Cancer Father     OBJECTIVE:  Vitals:   02/06/18 1553  BP: 133/67  Pulse: 82  Resp: 20  Temp: 97.9 F (36.6 C)  SpO2: 98%    General appearance: AOx3; in no acute distress.  Head: NCAT Eyes: pupils appear fixed and constricted; EOMI grossly Lungs: CTA bilaterally Heart: RRR.  Radial pulses 2+ bilaterally. Musculoskeletal: Neck, right shoulder, back Inspection: Skin warm, dry, clear and intact without obvious erythema, effusion, or ecchymosis.  Palpation: Nontender to palpation; unable to reproduce symptoms on exam ROM: FROM active and passive about right shoulder, neck, and back Strength: 5/5 shld abduction, 5/5 shld adduction, 5/5 elbow flexion, 5/5 elbow extension, 5/5 grip strength, 5/5 hip flexion, 5/5 knee abduction, 5/5 knee adduction, 5/5 knee flexion, 5/5 knee extension, 5/5 dorsiflexion, 5/5 plantar  flexion -SLR Skin: warm and dry Neurologic: Ambulates without difficulty; Sensation intact about the upper/ lower extremities Psychological: alert and cooperative; normal mood and affect  ASSESSMENT & PLAN:  1. Chronic right shoulder pain   2. Neck pain, chronic   3. Chronic low back pain with bilateral sciatica, unspecified back pain laterality     Meds ordered this encounter  Medications  . predniSONE (DELTASONE) 20 MG tablet    Sig: Take 1 tablet (20 mg total) by mouth 2 (two) times daily with a meal for 5 days.    Dispense:  10 tablet    Refill:  0    Order Specific Question:   Supervising Provider    Answer:   Raylene Everts [[id number]]   Continue conservative management of rest, ice, heat, and gentle stretches Prednisone prescribed.  Take as directed and to completion Please follow up with PCP and they will refer you to the appropriate speciality physician to manage your symptoms Return or go to the ER if you have any new or worsening symptoms (fever, chills, chest pain, abdominal pain, changes in bowel or bladder habits, pain radiating into lower legs, etc...)   Reviewed expectations re: course of current medical issues. Questions answered. Outlined signs and symptoms indicating need for more acute intervention. Patient verbalized understanding. After Visit Summary given.    Lestine Box, PA-C 02/06/18 1829

## 2018-02-06 NOTE — Discharge Instructions (Addendum)
Continue conservative management of rest, ice, heat, and gentle stretches Prednisone prescribed.  Take as directed and to completion Please follow up with PCP and they will refer you to the appropriate speciality physician to manage your symptoms Return or go to the ER if you have any new or worsening symptoms (fever, chills, chest pain, abdominal pain, changes in bowel or bladder habits, pain radiating into lower legs, etc...)

## 2018-02-25 DIAGNOSIS — M19011 Primary osteoarthritis, right shoulder: Secondary | ICD-10-CM | POA: Diagnosis not present

## 2018-05-01 ENCOUNTER — Encounter: Payer: Self-pay | Admitting: Family Medicine

## 2018-05-01 ENCOUNTER — Ambulatory Visit (INDEPENDENT_AMBULATORY_CARE_PROVIDER_SITE_OTHER): Payer: Medicare Other | Admitting: Family Medicine

## 2018-05-01 VITALS — Wt 204.0 lb

## 2018-05-01 DIAGNOSIS — R3589 Other polyuria: Secondary | ICD-10-CM

## 2018-05-01 DIAGNOSIS — R252 Cramp and spasm: Secondary | ICD-10-CM

## 2018-05-01 DIAGNOSIS — R209 Unspecified disturbances of skin sensation: Secondary | ICD-10-CM | POA: Diagnosis not present

## 2018-05-01 DIAGNOSIS — R358 Other polyuria: Secondary | ICD-10-CM | POA: Diagnosis not present

## 2018-05-01 DIAGNOSIS — R350 Frequency of micturition: Secondary | ICD-10-CM | POA: Diagnosis not present

## 2018-05-01 DIAGNOSIS — N401 Enlarged prostate with lower urinary tract symptoms: Secondary | ICD-10-CM | POA: Diagnosis not present

## 2018-05-01 DIAGNOSIS — F17210 Nicotine dependence, cigarettes, uncomplicated: Secondary | ICD-10-CM

## 2018-05-01 DIAGNOSIS — I25118 Atherosclerotic heart disease of native coronary artery with other forms of angina pectoris: Secondary | ICD-10-CM | POA: Diagnosis not present

## 2018-05-01 NOTE — Progress Notes (Signed)
David Hendricks - 68 y.o. male MRN [id number]  Date of birth: 1950/09/04   This visit type was conducted due to national recommendations for restrictions regarding the COVID-19 Pandemic (e.g. social distancing).  This format is felt to be most appropriate for this patient at this time.  All issues noted in this document were discussed and addressed.  No physical exam was performed (except for noted visual exam findings with Video Visits).  I discussed the limitations of evaluation and management by telemedicine and the availability of in person appointments. The patient expressed understanding and agreed to proceed.  I connected with@ on 05/01/18 at  3:30 PM EDT by a video enabled telemedicine application and verified that I am speaking with the correct person using two identifiers.   Patient Location: Home [address] [place] [address]   Provider location:   [facility]  Chief Complaint  Patient presents with  . Leg Pain    Swelling/tingling/poor circulation onset: pm sleep, 4 wks,"feels like I'm wearing socks all the time"     HPI  David Hendricks is a 68 y.o. male who presents via audio/video conferencing for a telehealth visit today.  He has complaint of cramping sensation in legs and feet as well as feeling like he is "wearing socks all of the time".  These symptoms started about 1 month ago.  Symptoms typically worse at night.  He admits to feeling of legs feeling restless in bed as well.  He has been urinating a lot more frequently, thinks it may be due to him being off flomax.  He denies increased thirst.  He denies pain or cramping with walking.  He has not had any swelling in his legs.  He does continue to smoke fairly heavily.  He is not interested in quitting at this time.    ROS:  A comprehensive ROS was completed and negative except as noted per HPI  Past Medical History:  Diagnosis Date  . Anxiety   . Arthritis   . Coronary artery disease    10/18 PCI/DES to  pLAD, mild nonobstructive disease in the Lcx/RCA. Normal EF.   Marland Kitchen Dysrhythmia 2016   irregular heartbeat  . Myocardial infarction ([facility])   . Seasonal allergies     Past Surgical History:  Procedure Laterality Date  . CORONARY ANGIOPLASTY  10/09/2016  . CORONARY STENT INTERVENTION N/A 10/09/2016   Procedure: CORONARY STENT INTERVENTION;  Surgeon: Nelva Bush, MD;  Location: [facility];  Service: Cardiovascular;  Laterality: N/A;  . INTRAVASCULAR ULTRASOUND/IVUS N/A 10/09/2016   Procedure: Intravascular Ultrasound/IVUS;  Surgeon: Nelva Bush, MD;  Location: [facility];  Service: Cardiovascular;  Laterality: N/A;  . LEFT HEART CATH AND CORONARY ANGIOGRAPHY N/A 10/09/2016   Procedure: LEFT HEART CATH AND CORONARY ANGIOGRAPHY;  Surgeon: Nelva Bush, MD;  Location: [facility];  Service: Cardiovascular;  Laterality: N/A;    Family History  Problem Relation Age of Onset  . Cancer Father     Social History   Socioeconomic History  . Marital status: Divorced    Spouse name: Not on file  . Number of children: Not on file  . Years of education: Not on file  . Highest education level: Not on file  Occupational History  . Not on file  Social Needs  . Financial resource strain: Not on file  . Food insecurity:    Worry: Not on file    Inability: Not on file  . Transportation needs:  Medical: Not on file    Non-medical: Not on file  Tobacco Use  . Smoking status: Current Every Day Smoker    Packs/day: 2.00    Years: 45.00    Pack years: 90.00    Types: Cigarettes    Start date: 01/09/1961  . Smokeless tobacco: Never Used  . Tobacco comment: thinking about after settling in   Substance and Sexual Activity  . Alcohol use: No  . Drug use: No  . Sexual activity: Not on file  Lifestyle  . Physical activity:    Days per week: Not on file    Minutes per session: Not on file  . Stress: Not on file  Relationships  . Social connections:    Talks on  phone: Not on file    Gets together: Not on file    Attends religious service: Not on file    Active member of club or organization: Not on file    Attends meetings of clubs or organizations: Not on file    Relationship status: Not on file  . Intimate partner violence:    Fear of current or ex partner: Not on file    Emotionally abused: Not on file    Physically abused: Not on file    Forced sexual activity: Not on file  Other Topics Concern  . Not on file  Social History Narrative  . Not on file     Current Outpatient Medications:  .  aspirin 81 MG EC tablet, Take 1 tablet (81 mg total) by mouth daily., Disp: , Rfl:  .  atorvastatin (LIPITOR) 80 MG tablet, Take 1 tablet (80 mg total) by mouth daily at 6 PM., Disp: 90 tablet, Rfl: 1 .  fluticasone (FLONASE) 50 MCG/ACT nasal spray, Place 2 sprays into both nostrils daily., Disp: 16 g, Rfl: 3 .  meclizine (ANTIVERT) 25 MG tablet, Take 1 tablet (25 mg total) by mouth 3 (three) times daily as needed for dizziness., Disp: 30 tablet, Rfl: 0 .  meloxicam (MOBIC) 15 MG tablet, TAKE 1 TABLET(15 MG) BY MOUTH DAILY. DO NOT TAKE WITH ALEVE OR IBUPROFEN, Disp: 90 tablet, Rfl: 1 .  metoprolol tartrate (LOPRESSOR) 25 MG tablet, Take 0.5 tablets (12.5 mg total) by mouth 2 (two) times daily., Disp: 30 tablet, Rfl: 11 .  nitroGLYCERIN (NITROSTAT) 0.4 MG SL tablet, Place 1 tablet (0.4 mg total) under the tongue every 5 (five) minutes x 3 doses as needed for chest pain., Disp: 25 tablet, Rfl: 0 .  tamsulosin (FLOMAX) 0.4 MG CAPS capsule, Take 2 capsules (0.8 mg total) by mouth daily., Disp: 30 capsule, Rfl: 3 .  ticagrelor (BRILINTA) 90 MG TABS tablet, Take 1 tablet (90 mg total) by mouth 2 (two) times daily., Disp: 180 tablet, Rfl: 2 .  traZODone (DESYREL) 100 MG tablet, Take 0.5-1 tablets (50-100 mg total) by mouth at bedtime as needed for sleep., Disp: 30 tablet, Rfl: 2 .  triamcinolone cream (KENALOG) 0.1 %, APPLY TO THE AFFECTED AREA ON THE SKIN BID,  Disp: , Rfl: 2 .  buPROPion (WELLBUTRIN SR) 150 MG 12 hr tablet, Take 2 tablets (300 mg total) by mouth daily., Disp: , Rfl:   EXAM:  VITALS per patient if applicable: Wt 204 lb (92.5 kg) Comment: pt reports from hm  BMI 28.45 kg/m   GENERAL: alert, oriented, appears well and in no acute distress. Smoking during video visit today.   HEENT: atraumatic, conjunttiva clear, no obvious abnormalities on inspection of external nose and ears  NECK: normal movements of the head and neck  LUNGS: on inspection no signs of respiratory distress, breathing rate appears normal, no obvious gross SOB, gasping or wheezing  CV: no obvious cyanosis  MS: moves all visible extremities without noticeable abnormality  PSYCH/NEURO: pleasant and cooperative, no obvious depression or anxiety, speech and thought processing grossly intact  ASSESSMENT AND PLAN:  Discussed the following assessment and plan:  Leg cramping -Concern that diabetes may be contributing to this given polyuria and neuropathic type symptoms as well.  May be due to RLS as well, check cbc for signs of anemia.  -He will come in to have labs completed tomorrow.    Cigarette nicotine dependence without complication -Briefly counseled on smoking cessation but no interest in quitting at this time.   Benign prostatic hyperplasia with urinary frequency -Update psa -flomax renewed.        I discussed the assessment and treatment plan with the patient. The patient was provided an opportunity to ask questions and all were answered. The patient agreed with the plan and demonstrated an understanding of the instructions.   The patient was advised to call back or seek an in-person evaluation if the symptoms worsen or if the condition fails to improve as anticipated.     [name], DO

## 2018-05-01 NOTE — Assessment & Plan Note (Signed)
-  Update psa -flomax renewed.

## 2018-05-01 NOTE — Assessment & Plan Note (Signed)
-  Briefly counseled on smoking cessation but no interest in quitting at this time.

## 2018-05-01 NOTE — Assessment & Plan Note (Signed)
-  Concern that diabetes may be contributing to this given polyuria and neuropathic type symptoms as well.  May be due to RLS as well, check cbc for signs of anemia.  -He will come in to have labs completed tomorrow.

## 2018-05-02 ENCOUNTER — Other Ambulatory Visit: Payer: Self-pay

## 2018-05-02 ENCOUNTER — Other Ambulatory Visit (INDEPENDENT_AMBULATORY_CARE_PROVIDER_SITE_OTHER): Payer: Medicare Other

## 2018-05-02 DIAGNOSIS — R209 Unspecified disturbances of skin sensation: Secondary | ICD-10-CM | POA: Diagnosis not present

## 2018-05-02 DIAGNOSIS — R350 Frequency of micturition: Secondary | ICD-10-CM | POA: Diagnosis not present

## 2018-05-02 DIAGNOSIS — N401 Enlarged prostate with lower urinary tract symptoms: Secondary | ICD-10-CM | POA: Diagnosis not present

## 2018-05-02 DIAGNOSIS — R358 Other polyuria: Secondary | ICD-10-CM | POA: Diagnosis not present

## 2018-05-02 DIAGNOSIS — R3589 Other polyuria: Secondary | ICD-10-CM

## 2018-05-02 DIAGNOSIS — R252 Cramp and spasm: Secondary | ICD-10-CM

## 2018-05-02 LAB — POCT URINALYSIS DIPSTICK
Bilirubin, UA: NEGATIVE
Blood, UA: NEGATIVE
Glucose, UA: NEGATIVE
Ketones, UA: NEGATIVE
Leukocytes, UA: NEGATIVE
Nitrite, UA: NEGATIVE
Protein, UA: NEGATIVE
Spec Grav, UA: 1.015 (ref 1.010–1.025)
Urobilinogen, UA: 0.2 E.U./dL
pH, UA: 6 (ref 5.0–8.0)

## 2018-05-02 LAB — CBC WITH DIFFERENTIAL/PLATELET
Basophils Absolute: 0.1 10*3/uL (ref 0.0–0.1)
Basophils Relative: 1.1 % (ref 0.0–3.0)
Eosinophils Absolute: 0.3 10*3/uL (ref 0.0–0.7)
Eosinophils Relative: 3.4 % (ref 0.0–5.0)
HCT: 51.7 % (ref 39.0–52.0)
Hemoglobin: 17.8 g/dL — ABNORMAL HIGH (ref 13.0–17.0)
Lymphocytes Relative: 33.4 % (ref 12.0–46.0)
Lymphs Abs: 2.8 10*3/uL (ref 0.7–4.0)
MCHC: 34.5 g/dL (ref 30.0–36.0)
MCV: 84.5 fl (ref 78.0–100.0)
Monocytes Absolute: 0.6 10*3/uL (ref 0.1–1.0)
Monocytes Relative: 6.5 % (ref 3.0–12.0)
Neutro Abs: 4.7 10*3/uL (ref 1.4–7.7)
Neutrophils Relative %: 55.6 % (ref 43.0–77.0)
Platelets: 161 10*3/uL (ref 150.0–400.0)
RBC: 6.13 Mil/uL — ABNORMAL HIGH (ref 4.22–5.81)
RDW: 13.8 % (ref 11.5–15.5)
WBC: 8.4 10*3/uL (ref 4.0–10.5)

## 2018-05-02 LAB — COMPREHENSIVE METABOLIC PANEL
ALT: 14 U/L (ref 0–53)
AST: 15 U/L (ref 0–37)
Albumin: 4.2 g/dL (ref 3.5–5.2)
Alkaline Phosphatase: 98 U/L (ref 39–117)
BUN: 10 mg/dL (ref 6–23)
CO2: 31 mEq/L (ref 19–32)
Calcium: 9.1 mg/dL (ref 8.4–10.5)
Chloride: 101 mEq/L (ref 96–112)
Creatinine, Ser: 1.03 mg/dL (ref 0.40–1.50)
GFR: 71.62 mL/min (ref >60.00)
Glucose, Bld: 102 mg/dL — ABNORMAL HIGH (ref 70–99)
Potassium: 4.3 mEq/L (ref 3.5–5.1)
Sodium: 140 mEq/L (ref 135–145)
Total Bilirubin: 0.8 mg/dL (ref 0.2–1.2)
Total Protein: 6.4 g/dL (ref 6.0–8.3)

## 2018-05-02 LAB — VITAMIN B12: Vitamin B-12: 210 pg/mL — ABNORMAL LOW (ref 211–911)

## 2018-05-02 LAB — PSA: PSA: 4.6 ng/mL — ABNORMAL HIGH (ref 0.10–4.00)

## 2018-05-02 MED ORDER — TAMSULOSIN HCL 0.4 MG PO CAPS: 0.8000 mg | ORAL_CAPSULE | Freq: Every day | ORAL | 3 refills | Status: DC

## 2018-05-02 NOTE — Telephone Encounter (Signed)
Patient stated that the pharmacy never received the Rx.

## 2018-05-02 NOTE — Progress Notes (Signed)
Patient and lab staff wore face masks for visit per COVID-19 protocol.  

## 2018-05-03 ENCOUNTER — Other Ambulatory Visit: Payer: Self-pay

## 2018-05-03 ENCOUNTER — Other Ambulatory Visit: Payer: Self-pay | Admitting: Family Medicine

## 2018-05-03 ENCOUNTER — Other Ambulatory Visit (INDEPENDENT_AMBULATORY_CARE_PROVIDER_SITE_OTHER): Payer: Medicare Other

## 2018-05-03 DIAGNOSIS — R7309 Other abnormal glucose: Secondary | ICD-10-CM | POA: Diagnosis not present

## 2018-05-03 LAB — HEMOGLOBIN A1C: Hgb A1c MFr Bld: 5.5 % (ref 4.6–6.5)

## 2018-05-03 NOTE — Progress Notes (Signed)
a1c

## 2018-05-06 ENCOUNTER — Telehealth: Payer: Self-pay | Admitting: Family Medicine

## 2018-05-06 NOTE — Telephone Encounter (Signed)
Copied from [place] [phone]. Topic: Quick Communication - See Telephone Encounter >> May 06, 2018  1:29 PM Blase Mess A wrote: CRM for notification. See Telephone encounter for: 05/06/18.  Patient is calling back for lab results. Please Advise [phone] (M)

## 2018-05-07 NOTE — Telephone Encounter (Signed)
Called Pt. Left a message and MyChart results release completed.

## 2018-05-07 NOTE — Progress Notes (Signed)
CRM/phone enounter/lab results note created. Waiting on Pt to return call back.

## 2018-05-07 NOTE — Progress Notes (Signed)
B12 levels are quite low which may be contributing to the abnormal sensation in feet.  Recommend starting B12 injection monthly.  Alternatively he can try an OTC oral B12 supplement (1039mcg/day) if he doesn't want to do the shots. -PSA is also elevated, I would recommend that he be referred to urology.  Please enter referral if he agrees.

## 2018-05-08 ENCOUNTER — Telehealth: Payer: Self-pay | Admitting: Behavioral Health

## 2018-05-08 NOTE — Telephone Encounter (Signed)
Attempted to reach patient to schedule nurse visit for B12 injection. No answer at the time of call. Left message for patient to return call when available.

## 2018-05-09 NOTE — Progress Notes (Signed)
Tried to call Pt again. LVM.

## 2018-05-09 NOTE — Telephone Encounter (Signed)
Tried to call Pt . LVM.

## 2018-05-14 ENCOUNTER — Ambulatory Visit (INDEPENDENT_AMBULATORY_CARE_PROVIDER_SITE_OTHER): Payer: Medicare Other

## 2018-05-14 DIAGNOSIS — E538 Deficiency of other specified B group vitamins: Secondary | ICD-10-CM

## 2018-05-14 MED ORDER — CYANOCOBALAMIN 1000 MCG/ML IJ SOLN
1000.0000 ug | Freq: Once | INTRAMUSCULAR | Status: AC
  Administered 2018-05-14: 1000 ug via INTRAMUSCULAR

## 2018-05-14 NOTE — Progress Notes (Signed)
Pt presents in office for B12 monthly injection this was his first encounter. IM injection was given in the left ventrogluteal requested by pt. Pt tolerated well. No signs or symptoms were noted prior to pt leaving appointment. Pt scheduled next b12 injection for 06/13/2018

## 2018-05-14 NOTE — Progress Notes (Signed)
Medical treatment/procedure(s) were performed by an LPN. As primary care provider I was immediately available for consulation/collaboration. I agree with above documentation. Wilfred Lacy, AGNP-C

## 2018-05-18 ENCOUNTER — Telehealth: Payer: Medicare Other | Admitting: Physician Assistant

## 2018-05-18 DIAGNOSIS — M791 Myalgia, unspecified site: Secondary | ICD-10-CM

## 2018-05-18 NOTE — Progress Notes (Signed)
E-Visit for Corona Virus Screening  Based on your current symptoms, you may very well have the virus, however your symptoms are mild. Currently, not all patients are being tested. If the symptoms are mild and there is not a known exposure, performing the test is not indicated.  Coronavirus disease 2019 (COVID-19)is a respiratory illness that can spread from person to person. The virus that causes COVID-19 is a new virus that was first identified in the country of [place] but is now found in multiple other countries and has spread to the [place].  Symptoms associated with the virus are mild to severe fever, cough, and shortness of breath. There is currently no vaccine to protect against COVID-19, and there is no specific antiviral treatment for the virus.   It is vitally important that if you feel that you have an infection such as this virus or any other virus that you stay home and away from places where you may spread it to others.  You should self-quarantine for 14 days if you have symptoms that could potentially be coronavirus and avoid contact with people age 7 and older.    You may also take acetaminophen (Tylenol) as needed for fever.   Reduce your risk of any infection by using the same precautions used for avoiding the common cold or flu: Wash your hands often with soap and warm water for at least 20 seconds.  If soap and water are not readily available, use an alcohol-based hand sanitizer with at least 60% alcohol.  If coughing or sneezing, cover your mouth and nose by coughing or sneezing into the elbow areas of your shirt or coat, into a tissue or into your sleeve (not your hands). Avoid shaking hands with others and consider head nods or verbal greetings only.  Avoid touching your eyes,nose, or mouth with unwashed hands. Avoid close contact with people who are sick. Avoid places or events with large numbers of people in one location, like concerts or sporting  events. Carefully consider travel plans you have or are making. If you are planning any travel outside or inside the [place], visit the CDC'sTravelers' Health webpagefor the latest health notices. If you have some symptoms but not all symptoms, continue to monitor at home and seek medical attention if your symptoms worsen. If you are having a medical emergency, call 911.  HOME CARE Only take medications as instructed by your medical team. Drink plenty of fluids and get plenty of rest. A steam or ultrasonic humidifier can help if you have congestion.   GET HELP RIGHT AWAY IF: You develop worsening fever. You become short of breath You cough up blood. Your symptoms become more severe MAKE SURE YOU  Understand these instructions. Will watch your condition. Will get help right away if you are not doing well or get worse.  Your e-visit answers were reviewed by a board certified advanced clinical practitioner to complete your personal care plan.  Depending on the condition, your plan could have included both over the counter or prescription medications.  If there is a problem please reply once you have received a response from your provider. Your safety is important to [place].  If you have drug allergies check your prescription carefully.    You can use MyChart to ask questions about today's visit, request a non-urgent call back, or ask for a work or school excuse for 24 hours related to this e-Visit. If it has been greater than 24 hours you will need to follow  up with your provider, or enter a new e-Visit to address those concerns. You will get an e-mail in the next two days asking about your experience.  I hope that your e-visit has been valuable and will speed your recovery. Thank you for using e-visits.    ===View-only below this line===   ----- Message -----    From: David Hendricks    Sent: 05/18/2018  2:27 PM EDT      To: E-Visit Mailing List Subject: E-Visit Submission: CoronaVirus (COVID-19)  Screening  E-Visit Submission: CoronaVirus (PPIRJ-18) Screening --------------------------------  Question: Do you have any of the following?  Answer:   Cough  Question: Do you have any of the following additional symptoms?  Answer:   Body aches  Question: Have you had a fever? Answer:   No  Question: Have others in your home or workplace had similar symptoms? Answer:   No  Question: When did your symptoms start? Answer:   may 3  Question: Have you recently visited any of the following countries? Answer:   None of these  Question: If you have traveled anywhere in the last  2 months please document where you have visited: Answer:   no  Question: Have you recently been around others from these countries or visited these countries who have had coughing or fever? Answer:   No  Question: Have you recently been around anyone who has been diagnosed with Corona virus? Answer:   No  Question: Have you been taking any medications? Answer:   No  Question: If taking medications for these symptoms, please list the names and whether they are helping or not Answer:   n/a  Question: Are you treated for any of the following conditions: Asthma, COPD, Diabetes, Renal Failure (on Dialysis), AIDS, any Neuromuscular disease that effects the clearing of secretions, Heart Failure, or Heart Disease? Answer:   No  Question: Please enter a phone number where you can be reached if we have additional questions about your symptoms Answer:   [phone]  Question: Please list your medication allergies that you may have ? (If 'none' , please list as 'none') Answer:   sinus  Question: Please list any additional comments  Answer:

## 2018-05-20 ENCOUNTER — Ambulatory Visit: Payer: Self-pay

## 2018-05-20 NOTE — Telephone Encounter (Signed)
Incoming call from Patient.  With complaint of taking 25 mg of hydroxyzine on Saturday still feels "drunk like " States he slept for an hour and 1/2.  Still drunk.  Wonders what he should do.  Recommended  He go to Urgent Care.  Dosen't feel comfortable driving. Lives alone.  Related  To [place] I would send encounter  to office. Patient states that he would find away to Urgent care if Sx. Got worse.                  Answer Assessment - Initial Assessment Questions 1. SYMPTOMS: "Do you have any symptoms?"     Speech slureed speech   1 hydoroxyzine  25mg   Feeling drunk  Able to stand slept, an hour and half still feels drunks.   2. SEVERITY: If symptoms are present, ask "Are they mild, moderate or severe?"     Moderate.  Protocols used: MEDICATION QUESTION CALL-A-AH

## 2018-05-21 DIAGNOSIS — T50905A Adverse effect of unspecified drugs, medicaments and biological substances, initial encounter: Secondary | ICD-10-CM | POA: Diagnosis not present

## 2018-06-12 ENCOUNTER — Telehealth: Payer: Self-pay | Admitting: Behavioral Health

## 2018-06-12 NOTE — Telephone Encounter (Signed)
Left detailed v/m regarding nurse visit appointment on tomorrow (06/13/2018) and pre-screening; instructed the patient to also call the office upon arrival.

## 2018-06-13 ENCOUNTER — Ambulatory Visit (INDEPENDENT_AMBULATORY_CARE_PROVIDER_SITE_OTHER): Payer: Medicare Other

## 2018-06-13 DIAGNOSIS — E538 Deficiency of other specified B group vitamins: Secondary | ICD-10-CM | POA: Diagnosis not present

## 2018-06-13 MED ORDER — CYANOCOBALAMIN 1000 MCG/ML IJ SOLN
1000.0000 ug | Freq: Once | INTRAMUSCULAR | Status: AC
  Administered 2018-06-13: 1000 ug via INTRAMUSCULAR

## 2018-06-13 NOTE — Progress Notes (Addendum)
Patient came into the office for his B12 injection. Injection given in the right upper outer quadrant per patient request. Patient tolerated injection well, no signs/symptoms of a reaction prior to leaving the office.  Agreed/

## 2018-06-27 DIAGNOSIS — M19011 Primary osteoarthritis, right shoulder: Secondary | ICD-10-CM | POA: Diagnosis not present

## 2018-06-27 DIAGNOSIS — G8929 Other chronic pain: Secondary | ICD-10-CM | POA: Diagnosis not present

## 2018-06-27 DIAGNOSIS — M25511 Pain in right shoulder: Secondary | ICD-10-CM | POA: Diagnosis not present

## 2018-07-09 ENCOUNTER — Telehealth: Payer: Medicare Other | Admitting: Family

## 2018-07-09 DIAGNOSIS — R0602 Shortness of breath: Secondary | ICD-10-CM

## 2018-07-09 NOTE — Progress Notes (Signed)
Based on what you shared with me, I feel your condition warrants further evaluation and I recommend that you be seen for a face to face office visit.  NOTE: If you entered your credit card information for this eVisit, you will not be charged. You may see a "hold" on your card for the $35 but that hold will drop off and you will not have a charge processed.  If you are having a true medical emergency please call 911.    Given your shortness of breath and weakness, you need to go to the ED to be evaluated.    For an urgent face to face visit, [facility] has five urgent care centers for your convenience:    [email or web address] to reserve your spot online an avoid wait times  [facility] [address], [address] [place], [place] [address] Modified hours of operation: Monday-Friday, 12 PM to 6 PM  Closed Saturday & Sunday  *Across the street from [place] (New Address!) [address], [place], [place] [address] *Just off [address], across the road from [place] hours of operation: Monday-Friday, 12 PM to 6 PM  Closed Saturday & Sunday   The following sites will take your insurance:  . [facility]    [phone]                  Get Driving Directions  [address], [place] [address] . 10 am to 8 pm Monday-Friday . 12 pm to 8 pm Saturday-Sunday   . [facility] at [place]                  Get Driving Directions  [address], [place], [place] [address] . 8 am to 8 pm Monday-Friday . 9 am to 6 pm Saturday . 11 am to 6 pm Sunday   . [facility] at [place]                  Get Driving Directions   [address] [place], [place] [address] . 8 am to 8 pm Monday-Friday . 8 am to 4 pm Saturday-Sunday    . [facility] at [place]                     Get Driving Directions  [phone]  [address], [place],  [address]  . Monday-Friday, 12 PM to 6 PM    Your e-visit answers were reviewed by a board certified advanced clinical practitioner to complete your personal care plan.  Thank you for using e-Visits.

## 2018-07-13 ENCOUNTER — Telehealth: Payer: Self-pay

## 2018-07-13 NOTE — Telephone Encounter (Signed)
Copied from [place] [id number]. Topic: Appointment Scheduling - Scheduling Inquiry for Clinic >> Jul 12, 2018  1:05 PM Lennox Solders wrote: Reason for CRM:pt is calling and needs b12 injection

## 2018-07-15 NOTE — Telephone Encounter (Signed)
He has been getting monthly B12 injection, ok to continue.

## 2018-07-16 ENCOUNTER — Telehealth: Payer: Self-pay

## 2018-07-16 NOTE — Telephone Encounter (Signed)
Questions for Screening COVID-19  Symptom onset: n/a  Travel or Contacts:no  During this illness, did/does the patient experience any of the following symptoms? Fever >100.3F []   Yes [x]   No []   Unknown Subjective fever (felt feverish) []   Yes [x]   No []   Unknown Chills []   Yes [x]   No []   Unknown Muscle aches (myalgia) []   Yes [x]   No []   Unknown Runny nose (rhinorrhea) []   Yes [x]   No []   Unknown Sore throat []   Yes [x]   No []   Unknown Cough (new onset or worsening of chronic cough) []   Yes [x]   No []   Unknown Shortness of breath (dyspnea) []   Yes [x]   No []   Unknown Nausea or vomiting []   Yes [x]   No []   Unknown Headache []   Yes [x]   No []   Unknown Abdominal pain  []   Yes [x]   No []   Unknown Diarrhea (?3 loose/looser than normal stools/24hr period) []   Yes [x]   No []   Unknown Other, specify:  Patient risk factors: Smoker? []   Current []   Former []   Never If male, currently pregnant? []   Yes [x]   No  Patient Active Problem List   Diagnosis Date Noted  . Leg cramping 05/01/2018  . Depression, major, single episode, mild ([place]) 06/21/2017  . Rib pain on left side 06/21/2017  . Pain of finger of left hand 05/21/2017  . Cigarette nicotine dependence without complication 35/68/6168  . Vertigo 05/10/2017  . Old MI (myocardial infarction) 01/30/2017  . CAD (coronary artery disease) 01/30/2017  . Chest pain with moderate risk of acute coronary syndrome 01/23/2017  . Hyperlipidemia 10/10/2016  . History of non-ST elevation myocardial infarction (NSTEMI) 10/09/2016  . Difficulty sleeping 04/22/2016  . Urinary dysfunction 04/22/2016  . Benign prostatic hyperplasia with urinary frequency 02/11/2016  . Subacromial bursitis of right shoulder joint 02/11/2016    Plan:  []   High risk for COVID-19 with red flags go to ED (with CP, SOB, weak/lightheaded, or fever > 101.5). Call ahead.  []   High risk for COVID-19 but stable. Inform provider and coordinate time for [facility] visit.   []   No  red flags but URI signs or symptoms okay for [facility] visit.

## 2018-07-17 ENCOUNTER — Ambulatory Visit (INDEPENDENT_AMBULATORY_CARE_PROVIDER_SITE_OTHER): Payer: Medicare Other

## 2018-07-17 DIAGNOSIS — E538 Deficiency of other specified B group vitamins: Secondary | ICD-10-CM

## 2018-07-17 MED ORDER — CYANOCOBALAMIN 1000 MCG/ML IJ SOLN
1000.0000 ug | Freq: Once | INTRAMUSCULAR | Status: AC
  Administered 2018-07-17: 1000 ug via INTRAMUSCULAR

## 2018-07-17 NOTE — Progress Notes (Signed)
Patient came into the office to receive his monthly B12 injection. Injection give in the left upper outer quadrant per pt request. No signs/symptoms of a reaction prior to leaving the office. Pt tolerated the injection well. Pt made an appointment for his next injection.

## 2018-07-18 NOTE — Progress Notes (Signed)
Agree with administration of B12 injection as documented.

## 2018-08-14 ENCOUNTER — Ambulatory Visit (INDEPENDENT_AMBULATORY_CARE_PROVIDER_SITE_OTHER): Payer: Medicare Other | Admitting: Family Medicine

## 2018-08-14 ENCOUNTER — Other Ambulatory Visit: Payer: Self-pay

## 2018-08-14 ENCOUNTER — Ambulatory Visit (INDEPENDENT_AMBULATORY_CARE_PROVIDER_SITE_OTHER): Payer: Medicare Other

## 2018-08-14 ENCOUNTER — Encounter: Payer: Self-pay | Admitting: Family Medicine

## 2018-08-14 DIAGNOSIS — E538 Deficiency of other specified B group vitamins: Secondary | ICD-10-CM

## 2018-08-14 DIAGNOSIS — L0390 Cellulitis, unspecified: Secondary | ICD-10-CM | POA: Insufficient documentation

## 2018-08-14 DIAGNOSIS — L03114 Cellulitis of left upper limb: Secondary | ICD-10-CM | POA: Diagnosis not present

## 2018-08-14 MED ORDER — CYANOCOBALAMIN 1000 MCG/ML IJ SOLN
1000.0000 ug | Freq: Once | INTRAMUSCULAR | Status: AC
  Administered 2018-08-14: 1000 ug via INTRAMUSCULAR

## 2018-08-14 MED ORDER — DOXYCYCLINE HYCLATE 100 MG PO TABS: 100.0000 mg | ORAL_TABLET | Freq: Two times a day (BID) | ORAL | 0 refills | Status: DC

## 2018-08-14 NOTE — Patient Instructions (Signed)

## 2018-08-14 NOTE — Progress Notes (Signed)
After obtaining consent, and per orders of Dr. Zigmund Daniel, injection of B-12 given by Ashari Llewellyn Berneta Sages. Patient instructed to remain in clinic for 20 minutes afterwards, and to report any adverse reaction to me immediately.

## 2018-08-14 NOTE — Progress Notes (Signed)
David Hendricks - 68 y.o. male MRN [id number]  Date of birth: October 17, 1950  Subjective Chief Complaint  Patient presents with  . Insect Bite    pt thinks maybe got bitten by spider/ achy arm from elbow up/ 4 or 5 days    HPI David Hendricks is a 68 y.o. male here today with complaint of L arm pain.  He reports that he has had L arm pain x4-5 days.  Has two red areas on arm where area is sore.  Not sure if this is from some type of spider/insect bite.  Pain is worse with moving arm.  He denies itching, fever, chills or fatigue.    ROS:  A comprehensive ROS was completed and negative except as noted per HPI  No Known Allergies  Past Medical History:  Diagnosis Date  . Anxiety   . Arthritis   . Coronary artery disease    10/18 PCI/DES to pLAD, mild nonobstructive disease in the Lcx/RCA. Normal EF.   Marland Kitchen Dysrhythmia 2016   irregular heartbeat  . Myocardial infarction ([place])   . Seasonal allergies     Past Surgical History:  Procedure Laterality Date  . CORONARY ANGIOPLASTY  10/09/2016  . CORONARY STENT INTERVENTION N/A 10/09/2016   Procedure: CORONARY STENT INTERVENTION;  Surgeon: Nelva Bush, MD;  Location: [facility];  Service: Cardiovascular;  Laterality: N/A;  . INTRAVASCULAR ULTRASOUND/IVUS N/A 10/09/2016   Procedure: Intravascular Ultrasound/IVUS;  Surgeon: Nelva Bush, MD;  Location: [facility];  Service: Cardiovascular;  Laterality: N/A;  . LEFT HEART CATH AND CORONARY ANGIOGRAPHY N/A 10/09/2016   Procedure: LEFT HEART CATH AND CORONARY ANGIOGRAPHY;  Surgeon: Nelva Bush, MD;  Location: [facility];  Service: Cardiovascular;  Laterality: N/A;    Social History   Socioeconomic History  . Marital status: Divorced    Spouse name: Not on file  . Number of children: Not on file  . Years of education: Not on file  . Highest education level: Not on file  Occupational History  . Not on file  Social Needs  . Financial resource strain: Not on file  .  Food insecurity    Worry: Not on file    Inability: Not on file  . Transportation needs    Medical: Not on file    Non-medical: Not on file  Tobacco Use  . Smoking status: Current Every Day Smoker    Packs/day: 2.00    Years: 45.00    Pack years: 90.00    Types: Cigarettes    Start date: 01/09/1961  . Smokeless tobacco: Never Used  . Tobacco comment: thinking about after settling in   Substance and Sexual Activity  . Alcohol use: No  . Drug use: No  . Sexual activity: Not on file  Lifestyle  . Physical activity    Days per week: Not on file    Minutes per session: Not on file  . Stress: Not on file  Relationships  . Social Herbalist on phone: Not on file    Gets together: Not on file    Attends religious service: Not on file    Active member of club or organization: Not on file    Attends meetings of clubs or organizations: Not on file    Relationship status: Not on file  Other Topics Concern  . Not on file  Social History Narrative  . Not on file    Family History  Problem Relation Age of Onset  .  Cancer Father     Health Maintenance  Topic Date Due  . Hepatitis C Screening  07-29-1950  . TETANUS/TDAP  05/29/1968  . PNA vac Low Risk Adult (2 of 2 - PPSV23) 05/11/2018  . INFLUENZA VACCINE  08/10/2018  . COLONOSCOPY  04/20/2026    ----------------------------------------------------------------------------------------------------------------------------------------------------------------------------------------------------------------- Physical Exam BP 122/68   Pulse 75   Temp 98.3 F (36.8 C) (Oral)   SpO2 92%   Physical Exam Constitutional:      Appearance: Normal appearance.  Cardiovascular:     Rate and Rhythm: Normal rate and regular rhythm.  Pulmonary:     Effort: Pulmonary effort is normal.     Breath sounds: Normal breath sounds.  Skin:    Comments: 2 erythematous lesions adjacent to each other on L upper arm.  Mild ttp and warmth  surround these areas.  No swelling or fluctuance noted.    Neurological:     General: No focal deficit present.     Mental Status: He is alert.  Psychiatric:        Mood and Affect: Mood normal.     ------------------------------------------------------------------------------------------------------------------------------------------------------------------------------------------------------------------- Assessment and Plan  Cellulitis -Start doxycycline 100mg  bid -May use cool compress prn for comfort -Call back if not improving or for any worsening symptoms.  Expresses understanding of plan.

## 2018-08-14 NOTE — Assessment & Plan Note (Signed)
-  Start doxycycline 100mg  bid -May use cool compress prn for comfort -Call back if not improving or for any worsening symptoms.  Expresses understanding of plan.

## 2018-08-15 NOTE — Progress Notes (Signed)
Agree with injection as documented.

## 2018-09-17 ENCOUNTER — Emergency Department (HOSPITAL_COMMUNITY): Payer: Medicare Other

## 2018-09-17 ENCOUNTER — Encounter (HOSPITAL_COMMUNITY): Payer: Self-pay | Admitting: Emergency Medicine

## 2018-09-17 ENCOUNTER — Ambulatory Visit: Payer: Medicare Other

## 2018-09-17 ENCOUNTER — Emergency Department (HOSPITAL_COMMUNITY)
Admission: EM | Admit: 2018-09-17 | Discharge: 2018-09-17 | Disposition: A | Discharge status: Home / Self Care | Payer: Medicare Other | Attending: Emergency Medicine | Admitting: Emergency Medicine

## 2018-09-17 ENCOUNTER — Encounter: Payer: Self-pay | Admitting: Family Medicine

## 2018-09-17 DIAGNOSIS — N200 Calculus of kidney: Secondary | ICD-10-CM | POA: Diagnosis not present

## 2018-09-17 DIAGNOSIS — R109 Unspecified abdominal pain: Secondary | ICD-10-CM | POA: Insufficient documentation

## 2018-09-17 DIAGNOSIS — F17210 Nicotine dependence, cigarettes, uncomplicated: Secondary | ICD-10-CM | POA: Diagnosis not present

## 2018-09-17 DIAGNOSIS — Z7982 Long term (current) use of aspirin: Secondary | ICD-10-CM | POA: Insufficient documentation

## 2018-09-17 DIAGNOSIS — I2510 Atherosclerotic heart disease of native coronary artery without angina pectoris: Secondary | ICD-10-CM | POA: Diagnosis not present

## 2018-09-17 DIAGNOSIS — R5381 Other malaise: Secondary | ICD-10-CM | POA: Diagnosis not present

## 2018-09-17 DIAGNOSIS — Z79899 Other long term (current) drug therapy: Secondary | ICD-10-CM | POA: Insufficient documentation

## 2018-09-17 DIAGNOSIS — R52 Pain, unspecified: Secondary | ICD-10-CM | POA: Diagnosis not present

## 2018-09-17 LAB — URINALYSIS, ROUTINE W REFLEX MICROSCOPIC
Bilirubin Urine: NEGATIVE
Glucose, UA: NEGATIVE mg/dL
Hgb urine dipstick: NEGATIVE
Ketones, ur: NEGATIVE mg/dL
Leukocytes,Ua: NEGATIVE
Nitrite: NEGATIVE
Protein, ur: NEGATIVE mg/dL
Specific Gravity, Urine: 1.015 (ref 1.005–1.030)
pH: 5 (ref 5.0–8.0)

## 2018-09-17 MED ORDER — METHOCARBAMOL 500 MG PO TABS: 500.0000 mg | ORAL_TABLET | Freq: Two times a day (BID) | ORAL | 0 refills | Status: DC

## 2018-09-17 MED ORDER — ONDANSETRON HCL 4 MG/2ML IJ SOLN
4.0000 mg | Freq: Once | INTRAMUSCULAR | Status: AC
  Administered 2018-09-17: 4 mg via INTRAVENOUS
  Filled 2018-09-17: qty 2

## 2018-09-17 MED ORDER — MORPHINE SULFATE (PF) 4 MG/ML IV SOLN
6.0000 mg | Freq: Once | INTRAVENOUS | Status: AC
  Administered 2018-09-17: 6 mg via INTRAVENOUS
  Filled 2018-09-17: qty 2

## 2018-09-17 NOTE — ED Notes (Signed)
PT have been made aware of urine sample. Urinal in hand

## 2018-09-17 NOTE — ED Provider Notes (Signed)
[facility] DEPT Provider Note   CSN: [id number] Arrival date & time: 09/17/18  0630     History   Chief Complaint Chief Complaint  Patient presents with   Flank Pain    HPI Cuba Sumrow is a 68 y.o. male.     68 year old male with history of kidney stones presents with sudden onset of left-sided flank pain which began this morning.  Pain is colicky and similar to his prior stones.  Denies any fever or chills.  Pain does not radiate to his groin.  Denies any hematuria or dysuria.  Nothing makes the patient's pain better or worse no treatment used prior to arrival.     Past Medical History:  Diagnosis Date   Anxiety    Arthritis    Coronary artery disease    10/18 PCI/DES to pLAD, mild nonobstructive disease in the Lcx/RCA. Normal EF.    Dysrhythmia 2016   irregular heartbeat   Myocardial infarction ([place])    Seasonal allergies     Patient Active Problem List   Diagnosis Date Noted   Cellulitis 08/14/2018   Leg cramping 05/01/2018   Depression, major, single episode, mild ([place]) 06/21/2017   Rib pain on left side 06/21/2017   Pain of finger of left hand 05/21/2017   Cigarette nicotine dependence without complication [id number]   Vertigo 05/10/2017   Old MI (myocardial infarction) 01/30/2017   CAD (coronary artery disease) 01/30/2017   Chest pain with moderate risk of acute coronary syndrome 01/23/2017   Hyperlipidemia 10/10/2016   History of non-ST elevation myocardial infarction (NSTEMI) 10/09/2016   Difficulty sleeping 04/22/2016   Urinary dysfunction 04/22/2016   Benign prostatic hyperplasia with urinary frequency 02/11/2016   Subacromial bursitis of right shoulder joint 02/11/2016    Past Surgical History:  Procedure Laterality Date   CORONARY ANGIOPLASTY  10/09/2016   CORONARY STENT INTERVENTION N/A 10/09/2016   Procedure: CORONARY STENT INTERVENTION;  Surgeon: Nelva Bush, MD;  Location: [facility];  Service: Cardiovascular;  Laterality: N/A;   INTRAVASCULAR ULTRASOUND/IVUS N/A 10/09/2016   Procedure: Intravascular Ultrasound/IVUS;  Surgeon: Nelva Bush, MD;  Location: [facility];  Service: Cardiovascular;  Laterality: N/A;   LEFT HEART CATH AND CORONARY ANGIOGRAPHY N/A 10/09/2016   Procedure: LEFT HEART CATH AND CORONARY ANGIOGRAPHY;  Surgeon: Nelva Bush, MD;  Location: [facility];  Service: Cardiovascular;  Laterality: N/A;        Home Medications    Prior to Admission medications   Medication Sig Start Date End Date Taking? Authorizing Provider  aspirin 81 MG EC tablet Take 1 tablet (81 mg total) by mouth daily. 10/10/16   Cheryln Manly, NP  atorvastatin (LIPITOR) 80 MG tablet Take 1 tablet (80 mg total) by mouth daily at 6 PM. 10/10/16   Cheryln Manly, NP  buPROPion [facility] SR) 150 MG 12 hr tablet Take 2 tablets (300 mg total) by mouth daily. 01/24/17 01/24/18  Geradine Girt, DO  doxycycline (VIBRA-TABS) 100 MG tablet Take 1 tablet (100 mg total) by mouth 2 (two) times daily. 08/14/18   [name], DO  fluticasone (FLONASE) 50 MCG/ACT nasal spray Place 2 sprays into both nostrils daily. 05/10/17   [name], DO  meclizine (ANTIVERT) 25 MG tablet Take 1 tablet (25 mg total) by mouth 3 (three) times daily as needed for dizziness. 05/10/17   [name], DO  meloxicam (MOBIC) 15 MG tablet TAKE 1 TABLET(15 MG) BY MOUTH DAILY. DO NOT TAKE WITH  ALEVE OR IBUPROFEN 06/21/17   [name], DO  metoprolol tartrate (LOPRESSOR) 25 MG tablet Take 0.5 tablets (12.5 mg total) by mouth 2 (two) times daily. 01/30/17   [name], MD  nitroGLYCERIN (NITROSTAT) 0.4 MG SL tablet Place 1 tablet (0.4 mg total) under the tongue every 5 (five) minutes x 3 doses as needed for chest pain. 01/24/17   Geradine Girt, DO  tamsulosin (FLOMAX) 0.4 MG CAPS capsule Take 2 capsules (0.8 mg total) by mouth daily. 05/02/18   [name], DO    ticagrelor (BRILINTA) 90 MG TABS tablet Take 1 tablet (90 mg total) by mouth 2 (two) times daily. 10/10/16   Cheryln Manly, NP  traZODone (DESYREL) 100 MG tablet Take 0.5-1 tablets (50-100 mg total) by mouth at bedtime as needed for sleep. 06/21/17   [name], DO  triamcinolone cream (KENALOG) 0.1 % APPLY TO THE AFFECTED AREA ON THE SKIN BID 03/16/17   [provider]    Family History Family History  Problem Relation Age of Onset   Cancer Father     Social History Social History   Tobacco Use   Smoking status: Current Every Day Smoker    Packs/day: 2.00    Years: 45.00    Pack years: 90.00    Types: Cigarettes    Start date: 01/09/1961   Smokeless tobacco: Never Used   Tobacco comment: thinking about after settling in   Substance Use Topics   Alcohol use: No   Drug use: No     Allergies   Patient has no known allergies.   Review of Systems Review of Systems  All other systems reviewed and are negative.    Physical Exam Updated Vital Signs BP 132/65 (BP Location: Left Arm)    Pulse 64    Temp 97.6 F (36.4 C) (Oral)    Resp 17    SpO2 95% Comment: Simultaneous filing. User may not have seen previous data.  Physical Exam Vitals signs and nursing note reviewed.  Constitutional:      General: He is not in acute distress.    Appearance: Normal appearance. He is well-developed. He is not toxic-appearing.  HENT:     Head: Normocephalic and atraumatic.  Eyes:     General: Lids are normal.     Conjunctiva/sclera: Conjunctivae normal.     Pupils: Pupils are equal, round, and reactive to light.  Neck:     Musculoskeletal: Normal range of motion and neck supple.     Thyroid: No thyroid mass.     Trachea: No tracheal deviation.  Cardiovascular:     Rate and Rhythm: Normal rate and regular rhythm.     Heart sounds: Normal heart sounds. No murmur. No gallop.   Pulmonary:     Effort: Pulmonary effort is normal. No respiratory distress.     Breath  sounds: Normal breath sounds. No stridor. No decreased breath sounds, wheezing, rhonchi or rales.  Abdominal:     General: Bowel sounds are normal. There is no distension.     Palpations: Abdomen is soft.     Tenderness: There is no abdominal tenderness. There is no rebound.  Musculoskeletal: Normal range of motion.        General: No tenderness.  Skin:    General: Skin is warm and dry.     Findings: No abrasion or rash.  Neurological:     Mental Status: He is alert and oriented to person, place, and time.  GCS: GCS eye subscore is 4. GCS verbal subscore is 5. GCS motor subscore is 6.     Cranial Nerves: No cranial nerve deficit.     Sensory: No sensory deficit.  Psychiatric:        Speech: Speech normal.        Behavior: Behavior normal.      ED Treatments / Results  Labs (all labs ordered are listed, but only abnormal results are displayed) Labs Reviewed  URINALYSIS, ROUTINE W REFLEX MICROSCOPIC    EKG None  Radiology No results found.  Procedures Procedures (including critical care time)  Medications Ordered in ED Medications  morphine 4 MG/ML injection 6 mg (has no administration in time range)  ondansetron (ZOFRAN) injection 4 mg (has no administration in time range)     Initial Impression / Assessment and Plan / ED Course  I have reviewed the triage vital signs and the nursing notes.  Pertinent labs & imaging results that were available during my care of the patient were reviewed by me and considered in my medical decision making (see chart for details).        Patient treated for pain here and feels better.  CT as well as urine without acute findings.  Will discharge home  Final Clinical Impressions(s) / ED Diagnoses   Final diagnoses:  None    ED Discharge Orders    None       Lacretia Leigh, MD 09/17/18 1259

## 2018-09-17 NOTE — ED Notes (Signed)
Patient transported to CT 

## 2018-09-17 NOTE — ED Triage Notes (Signed)
Pt arriving via [facility] for left flank pain. Pt has hx of kidney stones.

## 2018-09-18 ENCOUNTER — Telehealth: Payer: Self-pay

## 2018-09-18 NOTE — Telephone Encounter (Signed)
Tried to call to reschedule, Had to leave a message

## 2018-09-18 NOTE — Telephone Encounter (Signed)
Pt needs to schedule a B12 injection, he missed his appt yesterday.

## 2018-09-25 ENCOUNTER — Ambulatory Visit (INDEPENDENT_AMBULATORY_CARE_PROVIDER_SITE_OTHER): Payer: Medicare Other | Admitting: Behavioral Health

## 2018-09-25 DIAGNOSIS — E538 Deficiency of other specified B group vitamins: Secondary | ICD-10-CM | POA: Diagnosis not present

## 2018-09-25 MED ORDER — CYANOCOBALAMIN 1000 MCG/ML IJ SOLN
1000.0000 ug | Freq: Once | INTRAMUSCULAR | Status: AC
  Administered 2018-09-25: 1000 ug via INTRAMUSCULAR

## 2018-09-25 NOTE — Progress Notes (Signed)
Patient presents in office today for monthly B12 injection. IM injection was given in the right upper outer quadrant. Patient tolerated the injection well. No signs or symptoms of a reaction were noted prior to patient leaving the nurse visit.

## 2018-09-27 DIAGNOSIS — G8929 Other chronic pain: Secondary | ICD-10-CM | POA: Diagnosis not present

## 2018-09-27 DIAGNOSIS — M25511 Pain in right shoulder: Secondary | ICD-10-CM | POA: Diagnosis not present

## 2018-09-27 NOTE — Progress Notes (Signed)
Agree with b12 injection as documented.

## 2018-10-10 DIAGNOSIS — M4312 Spondylolisthesis, cervical region: Secondary | ICD-10-CM | POA: Diagnosis not present

## 2018-10-10 DIAGNOSIS — M47812 Spondylosis without myelopathy or radiculopathy, cervical region: Secondary | ICD-10-CM | POA: Diagnosis not present

## 2018-10-10 DIAGNOSIS — M542 Cervicalgia: Secondary | ICD-10-CM | POA: Diagnosis not present

## 2018-10-29 ENCOUNTER — Ambulatory Visit (INDEPENDENT_AMBULATORY_CARE_PROVIDER_SITE_OTHER): Payer: Medicare Other

## 2018-10-29 ENCOUNTER — Other Ambulatory Visit: Payer: Self-pay

## 2018-10-29 DIAGNOSIS — E538 Deficiency of other specified B group vitamins: Secondary | ICD-10-CM

## 2018-10-29 MED ORDER — CYANOCOBALAMIN 1000 MCG/ML IJ SOLN
1000.0000 ug | Freq: Once | INTRAMUSCULAR | Status: AC
  Administered 2018-10-29: 1000 ug via INTRAMUSCULAR

## 2018-10-29 NOTE — Progress Notes (Signed)
Virtual Visit via Video Note  I connected with patient on 10/30/18 at  2:30 PM EDT by audio enabled telemedicine application and verified that I am speaking with the correct person using two identifiers.   THIS ENCOUNTER IS A VIRTUAL VISIT DUE TO COVID-19 - PATIENT WAS NOT SEEN IN THE OFFICE. PATIENT HAS CONSENTED TO VIRTUAL VISIT / TELEMEDICINE VISIT   Location of patient: home  Location of provider: office  I discussed the limitations of evaluation and management by telemedicine and the availability of in person appointments. The patient expressed understanding and agreed to proceed.   Subjective:   David Hendricks is a 68 y.o. male who presents for an Initial Medicare Annual Wellness Visit.  Review of Systems  Home Safety/Smoke Alarms: Feels safe in home. Smoke alarms in place.  Lives alone with dog in 1st floor apt.   Male:   CCS-  Declines today. PSA-  Lab Results  Component Value Date   PSA 4.60 (H) 05/02/2018      Objective:    There were no vitals filed for this visit. There is no height or weight on file to calculate BMI.  Advanced Directives 10/30/2018 01/24/2017 10/09/2016 08/24/2016 08/08/2016 05/16/2016 04/20/2016  Does Patient Have a Medical Advance Directive? No No No No - No No  Would patient like information on creating a medical advance directive? No - Patient declined No - Patient declined No - Patient declined No - Patient declined No - Patient declined - No - Patient declined    Current Medications (verified) Outpatient Encounter Medications as of 10/30/2018  Medication Sig  . aspirin 81 MG EC tablet Take 1 tablet (81 mg total) by mouth daily.  . meclizine (ANTIVERT) 25 MG tablet Take 1 tablet (25 mg total) by mouth 3 (three) times daily as needed for dizziness.  . meloxicam (MOBIC) 15 MG tablet TAKE 1 TABLET(15 MG) BY MOUTH DAILY. DO NOT TAKE WITH ALEVE OR IBUPROFEN  . methocarbamol (ROBAXIN) 500 MG tablet Take 1 tablet (500 mg total) by mouth 2 (two) times  daily.  . naproxen sodium (ALEVE) 220 MG tablet Take 220 mg by mouth daily as needed (pain).  . tamsulosin (FLOMAX) 0.4 MG CAPS capsule Take 2 capsules (0.8 mg total) by mouth daily.  . traZODone (DESYREL) 100 MG tablet Take 0.5-1 tablets (50-100 mg total) by mouth at bedtime as needed for sleep.  . metoprolol tartrate (LOPRESSOR) 25 MG tablet Take 0.5 tablets (12.5 mg total) by mouth 2 (two) times daily. (Patient not taking: Reported on 09/17/2018)  . nitroGLYCERIN (NITROSTAT) 0.4 MG SL tablet Place 1 tablet (0.4 mg total) under the tongue every 5 (five) minutes x 3 doses as needed for chest pain. (Patient not taking: Reported on 09/17/2018)  . ticagrelor (BRILINTA) 90 MG TABS tablet Take 1 tablet (90 mg total) by mouth 2 (two) times daily. (Patient not taking: Reported on 09/17/2018)  . [DISCONTINUED] atorvastatin (LIPITOR) 80 MG tablet Take 1 tablet (80 mg total) by mouth daily at 6 PM. (Patient not taking: Reported on 10/30/2018)  . [DISCONTINUED] buPROPion (WELLBUTRIN SR) 150 MG 12 hr tablet Take 2 tablets (300 mg total) by mouth daily. (Patient not taking: Reported on 09/17/2018)  . [DISCONTINUED] fluticasone (FLONASE) 50 MCG/ACT nasal spray Place 2 sprays into both nostrils daily. (Patient not taking: Reported on 09/17/2018)  . [DISCONTINUED] methocarbamol (ROBAXIN) 500 MG tablet Take 1 tablet (500 mg total) by mouth 2 (two) times daily.   No facility-administered encounter medications on file as of 10/30/2018.  Allergies (verified) Patient has no known allergies.   History: Past Medical History:  Diagnosis Date  . Anxiety   . Arthritis   . Coronary artery disease    10/18 PCI/DES to pLAD, mild nonobstructive disease in the Lcx/RCA. Normal EF.   Marland Kitchen Dysrhythmia 2016   irregular heartbeat  . Myocardial infarction ([place])   . Seasonal allergies    Past Surgical History:  Procedure Laterality Date  . CORONARY ANGIOPLASTY  10/09/2016  . CORONARY STENT INTERVENTION N/A 10/09/2016   Procedure:  CORONARY STENT INTERVENTION;  Surgeon: Nelva Bush, MD;  Location: [facility];  Service: Cardiovascular;  Laterality: N/A;  . INTRAVASCULAR ULTRASOUND/IVUS N/A 10/09/2016   Procedure: Intravascular Ultrasound/IVUS;  Surgeon: Nelva Bush, MD;  Location: [facility];  Service: Cardiovascular;  Laterality: N/A;  . LEFT HEART CATH AND CORONARY ANGIOGRAPHY N/A 10/09/2016   Procedure: LEFT HEART CATH AND CORONARY ANGIOGRAPHY;  Surgeon: Nelva Bush, MD;  Location: [facility];  Service: Cardiovascular;  Laterality: N/A;   Family History  Problem Relation Age of Onset  . Cancer Father    Social History   Socioeconomic History  . Marital status: Divorced    Spouse name: Not on file  . Number of children: Not on file  . Years of education: Not on file  . Highest education level: Not on file  Occupational History  . Not on file  Social Needs  . Financial resource strain: Not on file  . Food insecurity    Worry: Not on file    Inability: Not on file  . Transportation needs    Medical: Not on file    Non-medical: Not on file  Tobacco Use  . Smoking status: Current Every Day Smoker    Packs/day: 2.00    Years: 45.00    Pack years: 90.00    Types: Cigarettes    Start date: 01/09/1961  . Smokeless tobacco: Never Used  . Tobacco comment: thinking about after settling in   Substance and Sexual Activity  . Alcohol use: No  . Drug use: No  . Sexual activity: Not on file  Lifestyle  . Physical activity    Days per week: Not on file    Minutes per session: Not on file  . Stress: Not on file  Relationships  . Social Herbalist on phone: Not on file    Gets together: Not on file    Attends religious service: Not on file    Active member of club or organization: Not on file    Attends meetings of clubs or organizations: Not on file    Relationship status: Not on file  Other Topics Concern  . Not on file  Social History Narrative  . Not on  file   Tobacco Counseling Ready to quit: No Counseling given: No Comment: thinking about after settling in    Clinical Intake: Pain : No/denies pain   Activities of Daily Living In your present state of health, do you have any difficulty performing the following activities: 10/30/2018  Hearing? N  Vision? N  Difficulty concentrating or making decisions? N  Walking or climbing stairs? N  Dressing or bathing? N  Doing errands, shopping? N  Preparing Food and eating ? N  Using the Toilet? N  In the past six months, have you accidently leaked urine? N  Do you have problems with loss of bowel control? N  Managing your Medications? N  Managing your Finances? N  Housekeeping or managing your Housekeeping? N  Some recent data might be hidden     Immunizations and Health Maintenance Immunization History  Administered Date(s) Administered  . Pneumococcal Conjugate-13 05/10/2017   Health Maintenance Due  Topic Date Due  . Hepatitis C Screening  03-25-1950  . TETANUS/TDAP  05/29/1968  . PNA vac Low Risk Adult (2 of 2 - PPSV23) 05/11/2018  . INFLUENZA VACCINE  08/10/2018    Patient Care Team: [name], DO as PCP - General (Family Medicine)  Indicate any recent Medical Services you may have received from other than [facility] providers in the past year (date may be approximate).    Assessment:   This is a routine wellness examination for Krunal. Physical assessment deferred to PCP  Hearing/Vision screen Unable to assess. This visit is enabled though telemedicine due to Covid 19.  Dietary issues and exercise activities discussed: Current Exercise Habits: Home exercise routine, Type of exercise: walking, Time (Minutes): 20, Frequency (Times/Week): 7, Weekly Exercise (Minutes/Week): 140, Exercise limited by: None identified Diet (meal preparation, eat out, water intake, caffeinated beverages, dairy products, fruits and vegetables): in general, an "unhealthy" diet   Goals    .  DIET - EAT MORE FRUITS AND VEGETABLES      Depression Screen PHQ 2/9 Scores 10/30/2018 06/21/2017 05/10/2017 08/24/2016  PHQ - 2 Score 0 2 0 0  PHQ- 9 Score - 11 - -    Fall Risk Fall Risk  10/30/2018 05/01/2018 05/10/2017 08/24/2016 08/08/2016  Falls in the past year? 0 0 Yes No No  Number falls in past yr: 0 - 2 or more - -  Injury with Fall? 0 - Yes - -  Risk for fall due to : - - - - -  Follow up - - - - -    Cognitive Function: Ad8 score reviewed for issues:  Issues making decisions:no  Less interest in hobbies / activities:no  Repeats questions, stories (family complaining):no  Trouble using ordinary gadgets (microwave, computer, phone):no  Forgets the month or year: no  Mismanaging finances: no  Remembering appts:no  Daily problems with thinking and/or memory:no Ad8 score is=0         Screening Tests Health Maintenance  Topic Date Due  . Hepatitis C Screening  1950-08-24  . TETANUS/TDAP  05/29/1968  . PNA vac Low Risk Adult (2 of 2 - PPSV23) 05/11/2018  . INFLUENZA VACCINE  08/10/2018  . COLONOSCOPY  04/20/2026      Plan:   See you next year!  Continue to eat heart healthy diet (full of fruits, vegetables, whole grains, lean protein, water--limit salt, fat, and sugar intake) and increase physical activity as tolerated.  Continue doing brain stimulating activities (puzzles, reading, adult coloring books, staying active) to keep memory sharp.   Bring a copy of your living will and/or healthcare power of attorney to your next office visit.   I have personally reviewed and noted the following in the patient's chart:   . Medical and social history . Use of alcohol, tobacco or illicit drugs  . Current medications and supplements . Functional ability and status . Nutritional status . Physical activity . Advanced directives . List of other physicians . Hospitalizations, surgeries, and ER visits in previous 12 months . Vitals . Screenings to include  cognitive, depression, and falls . Referrals and appointments  In addition, I have reviewed and discussed with patient certain preventive protocols, quality metrics, and best practice recommendations. A written personalized care plan for preventive services  as well as general preventive health recommendations were provided to patient.     Shela Nevin, [place]   10/30/2018

## 2018-10-29 NOTE — Progress Notes (Signed)
Agree with b12 injection as documented.

## 2018-10-29 NOTE — Progress Notes (Signed)
After obtaining consent, and per orders of Dr. Zigmund Daniel, injection of B-12 given in LUQ of Glute by Wilberto Console Berneta Sages. Patient instructed to remain in clinic for 20 minutes afterwards, and to report any adverse reaction to me immediately.

## 2018-10-30 ENCOUNTER — Encounter: Payer: Self-pay | Admitting: *Deleted

## 2018-10-30 ENCOUNTER — Ambulatory Visit (INDEPENDENT_AMBULATORY_CARE_PROVIDER_SITE_OTHER): Payer: Medicare Other | Admitting: *Deleted

## 2018-10-30 DIAGNOSIS — Z0000 Encounter for general adult medical examination without abnormal findings: Secondary | ICD-10-CM

## 2018-10-30 NOTE — Patient Instructions (Addendum)
See you next year!  Continue to eat heart healthy diet (full of fruits, vegetables, whole grains, lean protein, water--limit salt, fat, and sugar intake) and increase physical activity as tolerated.  Continue doing brain stimulating activities (puzzles, reading, adult coloring books, staying active) to keep memory sharp.   Bring a copy of your living will and/or healthcare power of attorney to your next office visit.   David Hendricks , Thank you for taking time to come for your Medicare Wellness Visit. I appreciate your ongoing commitment to your health goals. Please review the following plan we discussed and let me know if I can assist you in the future.   These are the goals we discussed: Goals    . DIET - EAT MORE FRUITS AND VEGETABLES       This is a list of the screening recommended for you and due dates:  Health Maintenance  Topic Date Due  .  Hepatitis C: One time screening is recommended by Center for Disease Control  (CDC) for  adults born from [birth year] through 1966.   20-Oct-1950  . Tetanus Vaccine  05/29/1968  . Pneumonia vaccines (2 of 2 - PPSV23) 05/11/2018  . Flu Shot  08/10/2018  . Colon Cancer Screening  04/20/2026    Health Maintenance After Age 75 After age 70, you are at a higher risk for certain long-term diseases and infections as well as injuries from falls. Falls are a major cause of broken bones and head injuries in people who are older than age 69. Getting regular preventive care can help to keep you healthy and well. Preventive care includes getting regular testing and making lifestyle changes as recommended by your health care provider. Talk with your health care provider about:  Which screenings and tests you should have. A screening is a test that checks for a disease when you have no symptoms.  A diet and exercise plan that is right for you. What should I know about screenings and tests to prevent falls? Screening and testing are the best ways to find a health  problem early. Early diagnosis and treatment give you the best chance of managing medical conditions that are common after age 34. Certain conditions and lifestyle choices may make you more likely to have a fall. Your health care provider may recommend:  Regular vision checks. Poor vision and conditions such as cataracts can make you more likely to have a fall. If you wear glasses, make sure to get your prescription updated if your vision changes.  Medicine review. Work with your health care provider to regularly review all of the medicines you are taking, including over-the-counter medicines. Ask your health care provider about any side effects that may make you more likely to have a fall. Tell your health care provider if any medicines that you take make you feel dizzy or sleepy.  Osteoporosis screening. Osteoporosis is a condition that causes the bones to get weaker. This can make the bones weak and cause them to break more easily.  Blood pressure screening. Blood pressure changes and medicines to control blood pressure can make you feel dizzy.  Strength and balance checks. Your health care provider may recommend certain tests to check your strength and balance while standing, walking, or changing positions.  Foot health exam. Foot pain and numbness, as well as not wearing proper footwear, can make you more likely to have a fall.  Depression screening. You may be more likely to have a fall if you have a  fear of falling, feel emotionally low, or feel unable to do activities that you used to do.  Alcohol use screening. Using too much alcohol can affect your balance and may make you more likely to have a fall. What actions can I take to lower my risk of falls? General instructions  Talk with your health care provider about your risks for falling. Tell your health care provider if: ? You fall. Be sure to tell your health care provider about all falls, even ones that seem minor. ? You feel dizzy,  sleepy, or off-balance.  Take over-the-counter and prescription medicines only as told by your health care provider. These include any supplements.  Eat a healthy diet and maintain a healthy weight. A healthy diet includes low-fat dairy products, low-fat (lean) meats, and fiber from whole grains, beans, and lots of fruits and vegetables. Home safety  Remove any tripping hazards, such as rugs, cords, and clutter.  Install safety equipment such as grab bars in bathrooms and safety rails on stairs.  Keep rooms and walkways well-lit. Activity   Follow a regular exercise program to stay fit. This will help you maintain your balance. Ask your health care provider what types of exercise are appropriate for you.  If you need a cane or walker, use it as recommended by your health care provider.  Wear supportive shoes that have nonskid soles. Lifestyle  Do not drink alcohol if your health care provider tells you not to drink.  If you drink alcohol, limit how much you have: ? 0-1 drink a day for women. ? 0-2 drinks a day for men.  Be aware of how much alcohol is in your drink. In the [place] one drink equals one typical bottle of beer (12 oz), one-half glass of wine (5 oz), or one shot of hard liquor (1 oz).  Do not use any products that contain nicotine or tobacco, such as cigarettes and e-cigarettes. If you need help quitting, ask your health care provider. Summary  Having a healthy lifestyle and getting preventive care can help to protect your health and wellness after age 69.  Screening and testing are the best way to find a health problem early and help you avoid having a fall. Early diagnosis and treatment give you the best chance for managing medical conditions that are more common for people who are older than age 69.  Falls are a major cause of broken bones and head injuries in people who are older than age 69. Take precautions to prevent a fall at home.  Work with your health  care provider to learn what changes you can make to improve your health and wellness and to prevent falls. This information is not intended to replace advice given to you by your health care provider. Make sure you discuss any questions you have with your health care provider. Document Released: 11/08/2016 Document Revised: 04/18/2018 Document Reviewed: 11/08/2016 Elsevier Patient Education  2020 [employer].

## 2018-11-01 ENCOUNTER — Ambulatory Visit: Payer: Medicare Other | Admitting: Family Medicine

## 2018-11-12 ENCOUNTER — Other Ambulatory Visit: Payer: Self-pay

## 2018-11-12 ENCOUNTER — Emergency Department (HOSPITAL_COMMUNITY): Payer: Medicare Other

## 2018-11-12 ENCOUNTER — Encounter (HOSPITAL_COMMUNITY): Payer: Self-pay

## 2018-11-12 ENCOUNTER — Emergency Department (HOSPITAL_COMMUNITY)
Admission: EM | Admit: 2018-11-12 | Discharge: 2018-11-12 | Disposition: A | Discharge status: Home / Self Care | Payer: Medicare Other | Source: Home / Self Care | Attending: Emergency Medicine | Admitting: Emergency Medicine

## 2018-11-12 DIAGNOSIS — J984 Other disorders of lung: Secondary | ICD-10-CM | POA: Diagnosis not present

## 2018-11-12 DIAGNOSIS — M25519 Pain in unspecified shoulder: Secondary | ICD-10-CM | POA: Diagnosis not present

## 2018-11-12 DIAGNOSIS — R52 Pain, unspecified: Secondary | ICD-10-CM | POA: Diagnosis not present

## 2018-11-12 DIAGNOSIS — R1011 Right upper quadrant pain: Secondary | ICD-10-CM

## 2018-11-12 DIAGNOSIS — R1084 Generalized abdominal pain: Secondary | ICD-10-CM | POA: Diagnosis not present

## 2018-11-12 DIAGNOSIS — R0602 Shortness of breath: Secondary | ICD-10-CM | POA: Diagnosis not present

## 2018-11-12 DIAGNOSIS — K819 Cholecystitis, unspecified: Secondary | ICD-10-CM | POA: Diagnosis not present

## 2018-11-12 DIAGNOSIS — K8000 Calculus of gallbladder with acute cholecystitis without obstruction: Secondary | ICD-10-CM | POA: Diagnosis not present

## 2018-11-12 DIAGNOSIS — F17210 Nicotine dependence, cigarettes, uncomplicated: Secondary | ICD-10-CM | POA: Insufficient documentation

## 2018-11-12 DIAGNOSIS — Z7982 Long term (current) use of aspirin: Secondary | ICD-10-CM | POA: Insufficient documentation

## 2018-11-12 DIAGNOSIS — I444 Left anterior fascicular block: Secondary | ICD-10-CM | POA: Diagnosis not present

## 2018-11-12 DIAGNOSIS — N200 Calculus of kidney: Secondary | ICD-10-CM | POA: Diagnosis not present

## 2018-11-12 DIAGNOSIS — Z79899 Other long term (current) drug therapy: Secondary | ICD-10-CM | POA: Insufficient documentation

## 2018-11-12 DIAGNOSIS — G8929 Other chronic pain: Secondary | ICD-10-CM | POA: Insufficient documentation

## 2018-11-12 DIAGNOSIS — M25511 Pain in right shoulder: Secondary | ICD-10-CM | POA: Insufficient documentation

## 2018-11-12 DIAGNOSIS — K828 Other specified diseases of gallbladder: Secondary | ICD-10-CM | POA: Diagnosis not present

## 2018-11-12 DIAGNOSIS — R109 Unspecified abdominal pain: Secondary | ICD-10-CM | POA: Diagnosis not present

## 2018-11-12 DIAGNOSIS — R0902 Hypoxemia: Secondary | ICD-10-CM | POA: Diagnosis not present

## 2018-11-12 DIAGNOSIS — K810 Acute cholecystitis: Secondary | ICD-10-CM | POA: Diagnosis not present

## 2018-11-12 DIAGNOSIS — Z20828 Contact with and (suspected) exposure to other viral communicable diseases: Secondary | ICD-10-CM | POA: Diagnosis not present

## 2018-11-12 DIAGNOSIS — J189 Pneumonia, unspecified organism: Secondary | ICD-10-CM | POA: Diagnosis not present

## 2018-11-12 DIAGNOSIS — R911 Solitary pulmonary nodule: Secondary | ICD-10-CM | POA: Diagnosis not present

## 2018-11-12 DIAGNOSIS — R531 Weakness: Secondary | ICD-10-CM | POA: Diagnosis not present

## 2018-11-12 DIAGNOSIS — K824 Cholesterolosis of gallbladder: Secondary | ICD-10-CM | POA: Diagnosis not present

## 2018-11-12 DIAGNOSIS — I2510 Atherosclerotic heart disease of native coronary artery without angina pectoris: Secondary | ICD-10-CM | POA: Insufficient documentation

## 2018-11-12 DIAGNOSIS — I4510 Unspecified right bundle-branch block: Secondary | ICD-10-CM | POA: Diagnosis not present

## 2018-11-12 DIAGNOSIS — M5489 Other dorsalgia: Secondary | ICD-10-CM | POA: Diagnosis not present

## 2018-11-12 LAB — CBC WITH DIFFERENTIAL/PLATELET
Abs Immature Granulocytes: 0.05 10*3/uL (ref 0.00–0.07)
Basophils Absolute: 0.1 10*3/uL (ref 0.0–0.1)
Basophils Relative: 1 %
Eosinophils Absolute: 0.2 10*3/uL (ref 0.0–0.5)
Eosinophils Relative: 1 %
HCT: 53.2 % — ABNORMAL HIGH (ref 39.0–52.0)
Hemoglobin: 17.6 g/dL — ABNORMAL HIGH (ref 13.0–17.0)
Immature Granulocytes: 0 %
Lymphocytes Relative: 15 %
Lymphs Abs: 2.4 10*3/uL (ref 0.7–4.0)
MCH: 28.3 pg (ref 26.0–34.0)
MCHC: 33.1 g/dL (ref 30.0–36.0)
MCV: 85.4 fL (ref 80.0–100.0)
Monocytes Absolute: 0.8 10*3/uL (ref 0.1–1.0)
Monocytes Relative: 5 %
Neutro Abs: 11.9 10*3/uL — ABNORMAL HIGH (ref 1.7–7.7)
Neutrophils Relative %: 78 %
Platelets: 155 10*3/uL (ref 150–400)
RBC: 6.23 MIL/uL — ABNORMAL HIGH (ref 4.22–5.81)
RDW: 12.7 % (ref 11.5–15.5)
WBC: 15.4 10*3/uL — ABNORMAL HIGH (ref 4.0–10.5)
nRBC: 0 % (ref 0.0–0.2)

## 2018-11-12 LAB — TROPONIN I (HIGH SENSITIVITY)
Troponin I (High Sensitivity): 3 ng/L (ref <18)
Troponin I (High Sensitivity): 3 ng/L (ref <18)

## 2018-11-12 LAB — COMPREHENSIVE METABOLIC PANEL
ALT: 17 U/L (ref 0–44)
AST: 19 U/L (ref 15–41)
Albumin: 4 g/dL (ref 3.5–5.0)
Alkaline Phosphatase: 100 U/L (ref 38–126)
Anion gap: 9 (ref 5–15)
BUN: 17 mg/dL (ref 8–23)
CO2: 29 mmol/L (ref 22–32)
Calcium: 9.2 mg/dL (ref 8.9–10.3)
Chloride: 102 mmol/L (ref 98–111)
Creatinine, Ser: 1.01 mg/dL (ref 0.61–1.24)
GFR calc Af Amer: >60 mL/min (ref >60)
GFR calc non Af Amer: >60 mL/min (ref >60)
Glucose, Bld: 115 mg/dL — ABNORMAL HIGH (ref 70–99)
Potassium: 3.9 mmol/L (ref 3.5–5.1)
Sodium: 140 mmol/L (ref 135–145)
Total Bilirubin: 0.5 mg/dL (ref 0.3–1.2)
Total Protein: 7 g/dL (ref 6.5–8.1)

## 2018-11-12 LAB — LIPASE, BLOOD: Lipase: 47 U/L (ref 11–51)

## 2018-11-12 MED ORDER — ONDANSETRON HCL 4 MG/2ML IJ SOLN
4.0000 mg | Freq: Once | INTRAMUSCULAR | Status: AC
  Administered 2018-11-12: 4 mg via INTRAVENOUS
  Filled 2018-11-12: qty 2

## 2018-11-12 MED ORDER — HYDROMORPHONE HCL 1 MG/ML IJ SOLN
1.0000 mg | Freq: Once | INTRAMUSCULAR | Status: AC
  Administered 2018-11-12: 03:00:00 1 mg via INTRAVENOUS
  Filled 2018-11-12: qty 1

## 2018-11-12 NOTE — ED Triage Notes (Signed)
Per ems: Pt coming from home c/o RUQ abdominal pain that started 2 hours ago that radiates to shoulder and back. Took meloxicam but threw it up.   128/72 69 hr 18rr 96%

## 2018-11-12 NOTE — ED Notes (Signed)
Pt verbalized discharge instructions and follow up care. Alert and ambulatory. No IV. Taking the bus home.

## 2018-11-12 NOTE — ED Provider Notes (Signed)
[facility] DEPT Provider Note   CSN: [id number] Arrival date & time: 11/12/18  [id number]     History   Chief Complaint Chief Complaint  Patient presents with  . Abdominal Pain    HPI David Hendricks is a 68 y.o. male.     Patient to ED with sudden onset RUQ abdominal pain that started 2 hours prior to arrival, associated with nausea and vomiting. No hematemesis, diarrhea, fever, chest pain, SOB. The pain started in his right shoulder and traveled down his right back to the RUQ abdomen. No history of gall stones. His last bowel movement was yesterday. He has a history of kidney stones but has no similar flank pain tonight.   The history is provided by the patient. No language interpreter was used.  Abdominal Pain Associated symptoms: nausea and vomiting   Associated symptoms: no chest pain, no chills, no fever and no shortness of breath     Past Medical History:  Diagnosis Date  . Anxiety   . Arthritis   . Coronary artery disease    10/18 PCI/DES to pLAD, mild nonobstructive disease in the Lcx/RCA. Normal EF.   Marland Kitchen Dysrhythmia 2016   irregular heartbeat  . Myocardial infarction ([place])   . Seasonal allergies     Patient Active Problem List   Diagnosis Date Noted  . Cellulitis 08/14/2018  . Leg cramping 05/01/2018  . Depression, major, single episode, mild ([place]) 06/21/2017  . Rib pain on left side 06/21/2017  . Pain of finger of left hand 05/21/2017  . Cigarette nicotine dependence without complication [id number]  . Vertigo 05/10/2017  . Old MI (myocardial infarction) 01/30/2017  . CAD (coronary artery disease) 01/30/2017  . Chest pain with moderate risk of acute coronary syndrome 01/23/2017  . Hyperlipidemia 10/10/2016  . History of non-ST elevation myocardial infarction (NSTEMI) 10/09/2016  . Difficulty sleeping 04/22/2016  . Urinary dysfunction 04/22/2016  . Benign prostatic hyperplasia with urinary frequency 02/11/2016  . Subacromial  bursitis of right shoulder joint 02/11/2016    Past Surgical History:  Procedure Laterality Date  . CORONARY ANGIOPLASTY  10/09/2016  . CORONARY STENT INTERVENTION N/A 10/09/2016   Procedure: CORONARY STENT INTERVENTION;  Surgeon: Nelva Bush, MD;  Location: [facility];  Service: Cardiovascular;  Laterality: N/A;  . INTRAVASCULAR ULTRASOUND/IVUS N/A 10/09/2016   Procedure: Intravascular Ultrasound/IVUS;  Surgeon: Nelva Bush, MD;  Location: [facility];  Service: Cardiovascular;  Laterality: N/A;  . LEFT HEART CATH AND CORONARY ANGIOGRAPHY N/A 10/09/2016   Procedure: LEFT HEART CATH AND CORONARY ANGIOGRAPHY;  Surgeon: Nelva Bush, MD;  Location: [facility];  Service: Cardiovascular;  Laterality: N/A;        Home Medications    Prior to Admission medications   Medication Sig Start Date End Date Taking? Authorizing Provider  aspirin 81 MG EC tablet Take 1 tablet (81 mg total) by mouth daily. 10/10/16   Cheryln Manly, NP  meclizine (ANTIVERT) 25 MG tablet Take 1 tablet (25 mg total) by mouth 3 (three) times daily as needed for dizziness. 05/10/17   [name], DO  meloxicam (MOBIC) 15 MG tablet TAKE 1 TABLET(15 MG) BY MOUTH DAILY. DO NOT TAKE WITH ALEVE OR IBUPROFEN 06/21/17   [name], DO  methocarbamol (ROBAXIN) 500 MG tablet Take 1 tablet (500 mg total) by mouth 2 (two) times daily. 09/17/18   Lacretia Leigh, MD  metoprolol tartrate (LOPRESSOR) 25 MG tablet Take 0.5 tablets (12.5 mg total) by mouth 2 (two)  times daily. Patient not taking: Reported on 09/17/2018 01/30/17   [name], MD  naproxen sodium (ALEVE) 220 MG tablet Take 220 mg by mouth daily as needed (pain).    [provider]  nitroGLYCERIN (NITROSTAT) 0.4 MG SL tablet Place 1 tablet (0.4 mg total) under the tongue every 5 (five) minutes x 3 doses as needed for chest pain. Patient not taking: Reported on 09/17/2018 01/24/17   Geradine Girt, DO  tamsulosin (FLOMAX)  0.4 MG CAPS capsule Take 2 capsules (0.8 mg total) by mouth daily. 05/02/18   [name], DO  ticagrelor (BRILINTA) 90 MG TABS tablet Take 1 tablet (90 mg total) by mouth 2 (two) times daily. Patient not taking: Reported on 09/17/2018 10/10/16   Reino Bellis B, NP  traZODone (DESYREL) 100 MG tablet Take 0.5-1 tablets (50-100 mg total) by mouth at bedtime as needed for sleep. 06/21/17   [name], DO    Family History Family History  Problem Relation Age of Onset  . Cancer Father     Social History Social History   Tobacco Use  . Smoking status: Current Every Day Smoker    Packs/day: 2.00    Years: 45.00    Pack years: 90.00    Types: Cigarettes    Start date: 01/09/1961  . Smokeless tobacco: Never Used  . Tobacco comment: thinking about after settling in   Substance Use Topics  . Alcohol use: No  . Drug use: No     Allergies   Patient has no known allergies.   Review of Systems Review of Systems  Constitutional: Negative for chills and fever.  HENT: Negative.   Respiratory: Negative.  Negative for shortness of breath.   Cardiovascular: Negative.  Negative for chest pain.  Gastrointestinal: Positive for abdominal pain, nausea and vomiting.  Musculoskeletal: Negative.   Skin: Negative.   Neurological: Negative.      Physical Exam Updated Vital Signs BP 129/86 (BP Location: Left Arm)   Pulse 68   Temp 97.7 F (36.5 C) (Oral)   Resp 20   Ht 6\' 1"  (1.854 m)   Wt 95.3 kg   SpO2 91%   BMI 27.71 kg/m   Physical Exam Constitutional:      Appearance: He is well-developed.  HENT:     Head: Normocephalic.  Neck:     Musculoskeletal: Normal range of motion and neck supple.  Cardiovascular:     Rate and Rhythm: Normal rate and regular rhythm.  Pulmonary:     Effort: Pulmonary effort is normal.     Breath sounds: Normal breath sounds.  Abdominal:     General: Bowel sounds are normal.     Palpations: Abdomen is soft.     Tenderness: There is abdominal  tenderness in the right upper quadrant. There is no guarding or rebound.  Musculoskeletal: Normal range of motion.     Comments: Right shoulder without swelling. FROM of the joint without limitation.  Skin:    General: Skin is warm and dry.     Findings: No rash.  Neurological:     Mental Status: He is alert and oriented to person, place, and time.      ED Treatments / Results  Labs (all labs ordered are listed, but only abnormal results are displayed) Labs Reviewed  CBC WITH DIFFERENTIAL/PLATELET  COMPREHENSIVE METABOLIC PANEL  LIPASE, BLOOD    EKG None  Radiology No results found.  Procedures Procedures (including critical care time)  Medications Ordered in ED Medications  ondansetron (ZOFRAN) injection 4 mg (has no administration in time range)  HYDROmorphone (DILAUDID) injection 1 mg (has no administration in time range)     Initial Impression / Assessment and Plan / ED Course  I have reviewed the triage vital signs and the nursing notes.  Pertinent labs & imaging results that were available during my care of the patient were reviewed by me and considered in my medical decision making (see chart for details).        Patient to ED with pain that started in his right shoulder, exacerbating his chronic right shoulder pain. Pain extended down the right back and settled in the RUQ causing nausea and vomiting. No fever.   He is tender in the RUQ. No previous abdominal surgeries. Will obtain labs, [place] RUQ to evaluate for cholecystitis.   Pain is well controlled with single dose morphine. No vomiting in the ED. NO fever, VSS.   [place] negative for evidence of cholecystitis. He has a history of CAD, however, EKG and troponin x 2 show no sign of ischemia in pain that would be atypical.   Re-exam of the abdomen is non-tender. Discussed close follow up with his PCP for consideration of further evaluation. He is comfortable with discharge home.   Final Clinical  Impressions(s) / ED Diagnoses   Final diagnoses:  None   1. Abdominal pain 2. Chronic right shoulder pain  ED Discharge Orders    None       Charlann Lange, PA-C 11/12/18 [id number]    Orpah Greek, MD 11/13/18 [phone]

## 2018-11-12 NOTE — Discharge Instructions (Signed)
Your labs and ultrasound are negative and do not identify what caused your pain. You can be discharged home but should see your doctor this week for further evaluation if pain recurs.   Return to the emergency department if you develop any high fever, uncontrolled vomiting or severe pain.

## 2018-11-13 ENCOUNTER — Emergency Department (HOSPITAL_COMMUNITY)
Admission: EM | Admit: 2018-11-13 | Discharge: 2018-11-13 | Disposition: A | Discharge status: Home / Self Care | Payer: Medicare Other | Source: Home / Self Care | Attending: Emergency Medicine | Admitting: Emergency Medicine

## 2018-11-13 ENCOUNTER — Encounter (HOSPITAL_COMMUNITY): Payer: Self-pay

## 2018-11-13 ENCOUNTER — Telehealth: Payer: Self-pay | Admitting: Family Medicine

## 2018-11-13 ENCOUNTER — Emergency Department (HOSPITAL_COMMUNITY): Payer: Medicare Other

## 2018-11-13 DIAGNOSIS — R1030 Lower abdominal pain, unspecified: Secondary | ICD-10-CM | POA: Diagnosis not present

## 2018-11-13 DIAGNOSIS — R110 Nausea: Secondary | ICD-10-CM | POA: Diagnosis not present

## 2018-11-13 DIAGNOSIS — R1084 Generalized abdominal pain: Secondary | ICD-10-CM

## 2018-11-13 DIAGNOSIS — R911 Solitary pulmonary nodule: Secondary | ICD-10-CM | POA: Diagnosis not present

## 2018-11-13 DIAGNOSIS — R112 Nausea with vomiting, unspecified: Secondary | ICD-10-CM | POA: Diagnosis not present

## 2018-11-13 DIAGNOSIS — I2510 Atherosclerotic heart disease of native coronary artery without angina pectoris: Secondary | ICD-10-CM | POA: Insufficient documentation

## 2018-11-13 DIAGNOSIS — R918 Other nonspecific abnormal finding of lung field: Secondary | ICD-10-CM

## 2018-11-13 DIAGNOSIS — R1011 Right upper quadrant pain: Secondary | ICD-10-CM | POA: Diagnosis not present

## 2018-11-13 DIAGNOSIS — Z79899 Other long term (current) drug therapy: Secondary | ICD-10-CM | POA: Insufficient documentation

## 2018-11-13 DIAGNOSIS — F17210 Nicotine dependence, cigarettes, uncomplicated: Secondary | ICD-10-CM | POA: Insufficient documentation

## 2018-11-13 DIAGNOSIS — R519 Headache, unspecified: Secondary | ICD-10-CM | POA: Diagnosis not present

## 2018-11-13 DIAGNOSIS — I252 Old myocardial infarction: Secondary | ICD-10-CM | POA: Insufficient documentation

## 2018-11-13 DIAGNOSIS — N200 Calculus of kidney: Secondary | ICD-10-CM | POA: Diagnosis not present

## 2018-11-13 DIAGNOSIS — I444 Left anterior fascicular block: Secondary | ICD-10-CM | POA: Diagnosis not present

## 2018-11-13 LAB — CBC WITH DIFFERENTIAL/PLATELET
Abs Immature Granulocytes: 0.06 10*3/uL (ref 0.00–0.07)
Basophils Absolute: 0.1 10*3/uL (ref 0.0–0.1)
Basophils Relative: 1 %
Eosinophils Absolute: 0.2 10*3/uL (ref 0.0–0.5)
Eosinophils Relative: 1 %
HCT: 51.6 % (ref 39.0–52.0)
Hemoglobin: 17.2 g/dL — ABNORMAL HIGH (ref 13.0–17.0)
Immature Granulocytes: 0 %
Lymphocytes Relative: 17 %
Lymphs Abs: 2.5 10*3/uL (ref 0.7–4.0)
MCH: 28.6 pg (ref 26.0–34.0)
MCHC: 33.3 g/dL (ref 30.0–36.0)
MCV: 85.7 fL (ref 80.0–100.0)
Monocytes Absolute: 1.1 10*3/uL — ABNORMAL HIGH (ref 0.1–1.0)
Monocytes Relative: 8 %
Neutro Abs: 10.9 10*3/uL — ABNORMAL HIGH (ref 1.7–7.7)
Neutrophils Relative %: 73 %
Platelets: 147 10*3/uL — ABNORMAL LOW (ref 150–400)
RBC: 6.02 MIL/uL — ABNORMAL HIGH (ref 4.22–5.81)
RDW: 12.8 % (ref 11.5–15.5)
WBC: 14.8 10*3/uL — ABNORMAL HIGH (ref 4.0–10.5)
nRBC: 0 % (ref 0.0–0.2)

## 2018-11-13 LAB — COMPREHENSIVE METABOLIC PANEL
ALT: 16 U/L (ref 0–44)
AST: 17 U/L (ref 15–41)
Albumin: 4.1 g/dL (ref 3.5–5.0)
Alkaline Phosphatase: 100 U/L (ref 38–126)
Anion gap: 7 (ref 5–15)
BUN: 11 mg/dL (ref 8–23)
CO2: 28 mmol/L (ref 22–32)
Calcium: 8.9 mg/dL (ref 8.9–10.3)
Chloride: 100 mmol/L (ref 98–111)
Creatinine, Ser: 0.98 mg/dL (ref 0.61–1.24)
GFR calc Af Amer: >60 mL/min (ref >60)
GFR calc non Af Amer: >60 mL/min (ref >60)
Glucose, Bld: 114 mg/dL — ABNORMAL HIGH (ref 70–99)
Potassium: 3.7 mmol/L (ref 3.5–5.1)
Sodium: 135 mmol/L (ref 135–145)
Total Bilirubin: 1.3 mg/dL — ABNORMAL HIGH (ref 0.3–1.2)
Total Protein: 7 g/dL (ref 6.5–8.1)

## 2018-11-13 LAB — URINALYSIS, ROUTINE W REFLEX MICROSCOPIC
Bilirubin Urine: NEGATIVE
Glucose, UA: NEGATIVE mg/dL
Hgb urine dipstick: NEGATIVE
Ketones, ur: NEGATIVE mg/dL
Leukocytes,Ua: NEGATIVE
Nitrite: NEGATIVE
Protein, ur: NEGATIVE mg/dL
Specific Gravity, Urine: 1.01 (ref 1.005–1.030)
pH: 7 (ref 5.0–8.0)

## 2018-11-13 LAB — LIPASE, BLOOD: Lipase: 34 U/L (ref 11–51)

## 2018-11-13 LAB — TROPONIN I (HIGH SENSITIVITY): Troponin I (High Sensitivity): 4 ng/L (ref <18)

## 2018-11-13 MED ORDER — MORPHINE SULFATE (PF) 4 MG/ML IV SOLN
4.0000 mg | Freq: Once | INTRAVENOUS | Status: AC
  Administered 2018-11-13: 10:00:00 4 mg via INTRAVENOUS
  Filled 2018-11-13: qty 1

## 2018-11-13 MED ORDER — SODIUM CHLORIDE 0.9 % IV BOLUS
1000.0000 mL | Freq: Once | INTRAVENOUS | Status: AC
  Administered 2018-11-13: 10:00:00 1000 mL via INTRAVENOUS

## 2018-11-13 MED ORDER — SODIUM CHLORIDE (PF) 0.9 % IJ SOLN
INTRAMUSCULAR | Status: AC
  Filled 2018-11-13: qty 50

## 2018-11-13 MED ORDER — ONDANSETRON HCL 4 MG/2ML IJ SOLN
4.0000 mg | Freq: Once | INTRAMUSCULAR | Status: AC
  Administered 2018-11-13: 10:00:00 4 mg via INTRAVENOUS
  Filled 2018-11-13: qty 2

## 2018-11-13 MED ORDER — IOHEXOL 300 MG/ML  SOLN
100.0000 mL | Freq: Once | INTRAMUSCULAR | Status: AC | PRN
  Administered 2018-11-13: 100 mL via INTRAVENOUS

## 2018-11-13 NOTE — Telephone Encounter (Signed)
Patient is aware of instructions.

## 2018-11-13 NOTE — Telephone Encounter (Signed)
Patient called saying that he is in sever back pain. He did go to the ER today about this concerns but he is schedule for a virtual visit with Dr. Zigmund Daniel for tomorrow. Patient states that Dr. Zigmund Daniel prescribed him Robaxin 500mg  and mobic 15mg  . He wants to know if he could take these medication for a muscle relaxer until his visit. Please advise. Patient phone number is  [phone]

## 2018-11-13 NOTE — Telephone Encounter (Signed)
Ok to take this

## 2018-11-13 NOTE — ED Notes (Signed)
Pt stated he feels he cannot void at this time, urinal provided for when he can.

## 2018-11-13 NOTE — ED Triage Notes (Addendum)
Pt presents with c/o LLQ abdominal pain that started this morning. Pt was seen yesterday c/o RUQ pain. Pt rates pain 6/10. Pt reports vomiting. Pt reports to EMS he does have a hx of MI, supposed to be on blood thinners but is no longer taking them.

## 2018-11-13 NOTE — Discharge Instructions (Signed)
You were seen in the emergency department for evaluation of some left-sided abdominal pain.  You had blood work EKG urinalysis and a CAT scan of your abdomen and pelvis that did not show an obvious explanation for your symptoms.  Please contact your primary care doctor for close follow-up.  Return to the emergency department if any worsening symptoms.  Your CAT scan did show some lung nodules that will also need follow-up by your primary care doctor.  We are including the report of the CAT scan below.  IMPRESSION:  1. Nonobstructing calculi in the left kidney, largest in the upper  pole left kidney region measuring 7 x 6 mm. No hydronephrosis or  ureteral calculus on either side. Urinary bladder wall thickness  normal. There are prostatic calculi.     2. No diverticulitis evident. No bowel obstruction. No abscess in  the abdomen or pelvis. Appendix appears normal.     3. Small nodular opacities in the right middle lobe, largest  measuring 5 x 4 mm. A 5 x 4 mm nodular opacity is more superior in  location in the other nodular opacities and has not been visualized  on previous studies due to different lung base starting points. No  follow-up needed if patient is low-risk (and has no known or  suspected primary neoplasm). Non-contrast chest CT can be considered  in 12 months if patient is high-risk. This recommendation follows  the consensus statement: Guidelines for Management of Incidental  Pulmonary Nodules Detected on CT Images: From the Fleischner Society  2017; Radiology 2017; 284:228-243.

## 2018-11-13 NOTE — ED Provider Notes (Signed)
[facility] DEPT Provider Note   CSN: [id number] Arrival date & time: 11/13/18  [id number]     History   Chief Complaint Chief Complaint  Patient presents with   Abdominal Pain    HPI David Hendricks is a 68 y.o. male.  He has a history of coronary disease.  He was here overnight yesterday with acute onset of right upper quadrant pain.  He says the work-up was unremarkable and he slept most of the day yesterday but started with more generalized pain and left-sided pain that was 8 out of 10 intensity associated with nausea and vomiting.  He is not sure when his last bowel movement was.  No urinary symptoms no cough no chest pain.  There is some pain in his shoulder but that sounds like more of a chronic issue.  He had labs and an ultrasound yesterday that was significant for an elevated white count.  Denies any fevers chills sore throat runny nose.     The history is provided by the patient.  Abdominal Pain Pain location:  L flank, LUQ and LLQ Pain quality: aching   Pain severity:  Moderate Onset quality:  Gradual Duration:  2 days Timing:  Constant Progression:  Unchanged Chronicity:  New Context: not sick contacts and not trauma   Relieved by:  None tried Worsened by:  Nothing Ineffective treatments:  None tried Associated symptoms: constipation, nausea and vomiting   Associated symptoms: no chest pain, no chills, no cough, no diarrhea, no dysuria, no fever, no hematemesis, no hematochezia, no hematuria, no melena, no shortness of breath and no sore throat     Past Medical History:  Diagnosis Date   Anxiety    Arthritis    Coronary artery disease    10/18 PCI/DES to pLAD, mild nonobstructive disease in the Lcx/RCA. Normal EF.    Dysrhythmia 2016   irregular heartbeat   Myocardial infarction ([place])    Seasonal allergies     Patient Active Problem List   Diagnosis Date Noted   Cellulitis 08/14/2018   Leg cramping 05/01/2018    Depression, major, single episode, mild ([place]) 06/21/2017   Rib pain on left side 06/21/2017   Pain of finger of left hand 05/21/2017   Cigarette nicotine dependence without complication [id number]   Vertigo 05/10/2017   Old MI (myocardial infarction) 01/30/2017   CAD (coronary artery disease) 01/30/2017   Chest pain with moderate risk of acute coronary syndrome 01/23/2017   Hyperlipidemia 10/10/2016   History of non-ST elevation myocardial infarction (NSTEMI) 10/09/2016   Difficulty sleeping 04/22/2016   Urinary dysfunction 04/22/2016   Benign prostatic hyperplasia with urinary frequency 02/11/2016   Subacromial bursitis of right shoulder joint 02/11/2016    Past Surgical History:  Procedure Laterality Date   CORONARY ANGIOPLASTY  10/09/2016   CORONARY STENT INTERVENTION N/A 10/09/2016   Procedure: CORONARY STENT INTERVENTION;  Surgeon: Nelva Bush, MD;  Location: [facility];  Service: Cardiovascular;  Laterality: N/A;   INTRAVASCULAR ULTRASOUND/IVUS N/A 10/09/2016   Procedure: Intravascular Ultrasound/IVUS;  Surgeon: Nelva Bush, MD;  Location: [facility];  Service: Cardiovascular;  Laterality: N/A;   LEFT HEART CATH AND CORONARY ANGIOGRAPHY N/A 10/09/2016   Procedure: LEFT HEART CATH AND CORONARY ANGIOGRAPHY;  Surgeon: Nelva Bush, MD;  Location: [facility];  Service: Cardiovascular;  Laterality: N/A;        Home Medications    Prior to Admission medications   Medication Sig Start Date End Date Taking? Authorizing  Provider  aspirin 81 MG EC tablet Take 1 tablet (81 mg total) by mouth daily. Patient not taking: Reported on 11/13/2018 10/10/16   Cheryln Manly, NP  meclizine (ANTIVERT) 25 MG tablet Take 1 tablet (25 mg total) by mouth 3 (three) times daily as needed for dizziness. 05/10/17   [name], DO  meloxicam (MOBIC) 15 MG tablet TAKE 1 TABLET(15 MG) BY MOUTH DAILY. DO NOT TAKE WITH ALEVE OR IBUPROFEN Patient  taking differently: Take 15 mg by mouth daily.  06/21/17   [name], DO  methocarbamol (ROBAXIN) 500 MG tablet Take 1 tablet (500 mg total) by mouth 2 (two) times daily. Patient taking differently: Take 500 mg by mouth 2 (two) times daily as needed for muscle spasms.  09/17/18   Lacretia Leigh, MD  metoprolol tartrate (LOPRESSOR) 25 MG tablet Take 0.5 tablets (12.5 mg total) by mouth 2 (two) times daily. Patient not taking: Reported on 09/17/2018 01/30/17   [name], MD  naproxen sodium (ALEVE) 220 MG tablet Take 220 mg by mouth daily as needed (pain).    [provider]  nitroGLYCERIN (NITROSTAT) 0.4 MG SL tablet Place 1 tablet (0.4 mg total) under the tongue every 5 (five) minutes x 3 doses as needed for chest pain. Patient not taking: Reported on 09/17/2018 01/24/17   Geradine Girt, DO  tamsulosin (FLOMAX) 0.4 MG CAPS capsule Take 2 capsules (0.8 mg total) by mouth daily. 05/02/18   [name], DO  ticagrelor (BRILINTA) 90 MG TABS tablet Take 1 tablet (90 mg total) by mouth 2 (two) times daily. Patient not taking: Reported on 09/17/2018 10/10/16   Reino Bellis B, NP  traZODone (DESYREL) 100 MG tablet Take 0.5-1 tablets (50-100 mg total) by mouth at bedtime as needed for sleep. 06/21/17   [name], DO    Family History Family History  Problem Relation Age of Onset   Cancer Father     Social History Social History   Tobacco Use   Smoking status: Current Every Day Smoker    Packs/day: 2.00    Years: 45.00    Pack years: 90.00    Types: Cigarettes    Start date: 01/09/1961   Smokeless tobacco: Never Used   Tobacco comment: thinking about after settling in   Substance Use Topics   Alcohol use: No   Drug use: No     Allergies   Patient has no known allergies.   Review of Systems Review of Systems  Constitutional: Negative for chills and fever.  HENT: Negative for sore throat.   Eyes: Negative for visual disturbance.  Respiratory: Negative  for cough and shortness of breath.   Cardiovascular: Negative for chest pain.  Gastrointestinal: Positive for abdominal pain, constipation, nausea and vomiting. Negative for diarrhea, hematemesis, hematochezia and melena.  Genitourinary: Negative for dysuria and hematuria.  Musculoskeletal: Positive for back pain.  Skin: Negative for rash.  Neurological: Negative for headaches.     Physical Exam Updated Vital Signs BP 132/72 (BP Location: Right Arm)    Pulse 81    Temp 98 F (36.7 C) (Oral)    Resp 18    SpO2 94%   Physical Exam Vitals signs and nursing note reviewed.  Constitutional:      Appearance: He is well-developed.  HENT:     Head: Normocephalic and atraumatic.  Eyes:     Conjunctiva/sclera: Conjunctivae normal.  Neck:     Musculoskeletal: Neck supple.  Cardiovascular:     Rate and Rhythm: Normal  rate and regular rhythm.     Heart sounds: No murmur.  Pulmonary:     Effort: Pulmonary effort is normal. No respiratory distress.     Breath sounds: Normal breath sounds.  Abdominal:     Palpations: Abdomen is soft.     Tenderness: There is generalized abdominal tenderness. There is no guarding or rebound.  Musculoskeletal: Normal range of motion.     Right lower leg: No edema.     Left lower leg: No edema.  Skin:    General: Skin is warm and dry.     Capillary Refill: Capillary refill takes less than 2 seconds.  Neurological:     General: No focal deficit present.     Mental Status: He is alert.      ED Treatments / Results  Labs (all labs ordered are listed, but only abnormal results are displayed) Labs Reviewed  COMPREHENSIVE METABOLIC PANEL - Abnormal; Notable for the following components:      Result Value   Glucose, Bld 114 (*)    Total Bilirubin 1.3 (*)    All other components within normal limits  CBC WITH DIFFERENTIAL/PLATELET - Abnormal; Notable for the following components:   WBC 14.8 (*)    RBC 6.02 (*)    Hemoglobin 17.2 (*)    Platelets 147  (*)    Neutro Abs 10.9 (*)    Monocytes Absolute 1.1 (*)    All other components within normal limits  URINALYSIS, ROUTINE W REFLEX MICROSCOPIC  LIPASE, BLOOD  TROPONIN I (HIGH SENSITIVITY)  TROPONIN I (HIGH SENSITIVITY)    EKG EKG Interpretation  Date/Time:  Wednesday November 13 2018 09:47:19 EST Ventricular Rate:  77 PR Interval:    QRS Duration: 119 QT Interval:  384 QTC Calculation: 435 R Axis:   -70 Text Interpretation: Sinus rhythm Left anterior fascicular block similar to prior ecg yesterday Confirmed by Aletta Edouard [phone]) on 11/13/2018 10:03:00 AM   Radiology Ct Abdomen Pelvis W Contrast  Result Date: 11/13/2018 CLINICAL DATA:  Abdominal pain, primarily left lower quadrant region EXAM: CT ABDOMEN AND PELVIS WITH CONTRAST TECHNIQUE: Multidetector CT imaging of the abdomen and pelvis was performed using the standard protocol following bolus administration of intravenous contrast. CONTRAST:  115mL OMNIPAQUE IOHEXOL 300 MG/ML  SOLN COMPARISON:  September 17, 2018, June 12, 2017, and November 29, 2015 FINDINGS: Lower chest: There is right base atelectatic change. There are small bullae in the inferior lingula. On axial slice 1 series 6, there is a 5 x 3 mm nodular opacity in the lateral segment of the right middle lobe. On axial slice six series 6, there is a stable 2 mm nodular opacity in the lateral segment right middle lobe, unchanged. There is a 2 mm nodular opacity also seen in the lateral segment right middle lobe on axial slice 18 series 6. No lung base edema or consolidation on either side. There is a small hiatal hernia. Hepatobiliary: No focal liver lesions are evident. The gallbladder wall is not appreciably thickened. There is no biliary duct dilatation. Pancreas: There is no pancreatic mass or inflammatory focus. Spleen: No splenic lesions are evident beyond a small calcified splenic granuloma anteriorly and medially. Adrenals/Urinary Tract: Adrenals bilaterally appear  unremarkable. There is a 5 mm apparent cyst in the mid right kidney as well as a 3 mm probable cyst in the posterior mid left kidney. There is no evident hydronephrosis on either side. There is a calculus in the upper pole of the left kidney measuring  7 x 6 mm. There is a 1 mm calculus in the mid left kidney. There is a 1 mm calculus in the upper pole left kidney. A 1 mm calculus is also noted in the lower pole of the left kidney. No calculi are demonstrated in the right kidney. There is no evident ureteral calculus on either side. Urinary bladder is mildly distended. The urinary bladder wall thickness is normal. Stomach/Bowel: There is no evident diverticulitis. There is no appreciable bowel wall or mesenteric thickening. There is no evident bowel obstruction. The terminal ileum appears normal. There is no free air or portal venous air. Vascular/Lymphatic: There is no abdominal aortic aneurysm. There is aortic and iliac artery atherosclerosis. No adenopathy is evident in the abdomen or pelvis. Reproductive: There are prostatic calculi. Prostate and seminal vesicles are normal in size and contour. No evident pelvic mass. Other: Appendix appears normal. No abscess or ascites is evident in the abdomen or pelvis. Musculoskeletal: There is degenerative change in the lumbar spine. There is vacuum phenomenon at L5-S1. There are no blastic or lytic bone lesions. There are a few scattered apparent bone islands in the pelvis and proximal femur regions, stable. There is no intramuscular or abdominal wall lesion. IMPRESSION: 1. Nonobstructing calculi in the left kidney, largest in the upper pole left kidney region measuring 7 x 6 mm. No hydronephrosis or ureteral calculus on either side. Urinary bladder wall thickness normal. There are prostatic calculi. 2. No diverticulitis evident. No bowel obstruction. No abscess in the abdomen or pelvis. Appendix appears normal. 3. Small nodular opacities in the right middle lobe, largest  measuring 5 x 4 mm. A 5 x 4 mm nodular opacity is more superior in location in the other nodular opacities and has not been visualized on previous studies due to different lung base starting points. No follow-up needed if patient is low-risk (and has no known or suspected primary neoplasm). Non-contrast chest CT can be considered in 12 months if patient is high-risk. This recommendation follows the consensus statement: Guidelines for Management of Incidental Pulmonary Nodules Detected on CT Images: From the Fleischner Society 2017; Radiology 2017; 284:228-243. 4.  Aortic Atherosclerosis (ICD10-I70.0). Electronically Signed   By: Lowella Grip III M.D.   On: 11/13/2018 10:58   US Abdomen Limited  Result Date: 11/12/2018 CLINICAL DATA:  Right upper quadrant abdominal pain EXAM: ULTRASOUND ABDOMEN LIMITED RIGHT UPPER QUADRANT COMPARISON:  None. FINDINGS: Gallbladder: The gallbladder is filled with sludge with multiple small stones. There is no gallbladder wall thickening. The sonographic Percell Miller sign is negative. Common bile duct: Diameter: 2 mm Liver: No focal lesion identified. Within normal limits in parenchymal echogenicity. Portal vein is patent on color Doppler imaging with normal direction of blood flow towards the liver. Other: None. IMPRESSION: Sludge filled gallbladder without evidence for acute cholecystitis. Electronically Signed   By: Constance Holster M.D.   On: 11/12/2018 03:38    Procedures Procedures (including critical care time)  Medications Ordered in ED Medications  morphine 4 MG/ML injection 4 mg (4 mg Intravenous Given 11/13/18 0941)  ondansetron (ZOFRAN) injection 4 mg (4 mg Intravenous Given 11/13/18 0941)  sodium chloride 0.9 % bolus 1,000 mL (0 mLs Intravenous Stopped 11/13/18 1150)  iohexol (OMNIPAQUE) 300 MG/ML solution 100 mL (100 mLs Intravenous Contrast Given 11/13/18 1028)     Initial Impression / Assessment and Plan / ED Course  I have reviewed the triage vital  signs and the nursing notes.  Pertinent labs & imaging results that were available  during my care of the patient were reviewed by me and considered in my medical decision making (see chart for details).  Clinical Course as of Nov 13 1927  Wed Nov 12, 8948  6144 68 year old male history of coronary disease here with left-sided abdominal pain after being here yesterday for right-sided abdominal pain.  Differential includes diverticulitis, renal colic, gastroenteritis, colitis, [place]   [MB]  1128 Reviewed the results of the patient's CAT scan and labs with him.  He understands he needs to follow-up with his primary care doctor for evaluation of his symptoms along with seeing if he needs any further testing for the nodules noted.  He says he has had these random pains for a while and has had tests by his doctor and they never seem to be figure out why he has them.   [MB]    Clinical Course User Index [MB] Hayden Rasmussen, MD        Final Clinical Impressions(s) / ED Diagnoses   Final diagnoses:  Generalized abdominal pain  Lung nodules    ED Discharge Orders    None       Hayden Rasmussen, MD 11/13/18 1930

## 2018-11-14 ENCOUNTER — Encounter: Payer: Self-pay | Admitting: Family Medicine

## 2018-11-14 ENCOUNTER — Telehealth (INDEPENDENT_AMBULATORY_CARE_PROVIDER_SITE_OTHER): Payer: Medicare Other | Admitting: Family Medicine

## 2018-11-14 ENCOUNTER — Emergency Department (HOSPITAL_COMMUNITY)
Admission: EM | Admit: 2018-11-14 | Discharge: 2018-11-14 | Disposition: A | Discharge status: Home / Self Care | Payer: Medicare Other | Source: Home / Self Care

## 2018-11-14 ENCOUNTER — Other Ambulatory Visit: Payer: Self-pay

## 2018-11-14 ENCOUNTER — Encounter (HOSPITAL_COMMUNITY): Payer: Self-pay | Admitting: Emergency Medicine

## 2018-11-14 ENCOUNTER — Telehealth: Payer: Self-pay | Admitting: Family Medicine

## 2018-11-14 DIAGNOSIS — R109 Unspecified abdominal pain: Secondary | ICD-10-CM | POA: Insufficient documentation

## 2018-11-14 DIAGNOSIS — Z5321 Procedure and treatment not carried out due to patient leaving prior to being seen by health care provider: Secondary | ICD-10-CM | POA: Insufficient documentation

## 2018-11-14 DIAGNOSIS — Z209 Contact with and (suspected) exposure to unspecified communicable disease: Secondary | ICD-10-CM | POA: Diagnosis not present

## 2018-11-14 DIAGNOSIS — R1110 Vomiting, unspecified: Secondary | ICD-10-CM | POA: Insufficient documentation

## 2018-11-14 DIAGNOSIS — R110 Nausea: Secondary | ICD-10-CM | POA: Diagnosis not present

## 2018-11-14 DIAGNOSIS — G4489 Other headache syndrome: Secondary | ICD-10-CM | POA: Diagnosis not present

## 2018-11-14 DIAGNOSIS — R1030 Lower abdominal pain, unspecified: Secondary | ICD-10-CM | POA: Diagnosis not present

## 2018-11-14 DIAGNOSIS — R1084 Generalized abdominal pain: Secondary | ICD-10-CM | POA: Diagnosis not present

## 2018-11-14 LAB — URINALYSIS, ROUTINE W REFLEX MICROSCOPIC
Bacteria, UA: NONE SEEN
Glucose, UA: NEGATIVE mg/dL
Hgb urine dipstick: NEGATIVE
Ketones, ur: NEGATIVE mg/dL
Leukocytes,Ua: NEGATIVE
Nitrite: NEGATIVE
Protein, ur: 100 mg/dL — AB
Specific Gravity, Urine: 1.024 (ref 1.005–1.030)
pH: 6 (ref 5.0–8.0)

## 2018-11-14 MED ORDER — TRAMADOL HCL 50 MG PO TABS: 50.0000 mg | ORAL_TABLET | Freq: Two times a day (BID) | ORAL | 0 refills | Status: DC | PRN

## 2018-11-14 MED ORDER — SODIUM CHLORIDE 0.9% FLUSH: 3.0000 mL | Freq: Once | INTRAVENOUS | Status: DC

## 2018-11-14 NOTE — Telephone Encounter (Addendum)
Patient's sister-in-law, Kanan Ooms [phone] would like a call back from Dr. Zigmund Daniel or his medical assistant to update her on the patient's condition.  He has not been coherent when she talks to him and he has been back and forth to the hospital for the last couple of days.  Please advise.

## 2018-11-14 NOTE — Assessment & Plan Note (Addendum)
-  Unclear cause, no colitis/diverticultiis noted on CT scan.  ?viral illness. He declines COVID testing at this time. Differential also includes ischemic pathology however CT without any changes  -No ureteral stone noted on CT. Unlikely to be prostate given no urinary symptoms and pain is not in lower back.  -Discussed that if symptoms were to worsen I would recommend he return to ED. -Will send in short term tramadol to help with pain control for now.  -Will consider referral to GI if this persists.

## 2018-11-14 NOTE — Telephone Encounter (Signed)
I spoke with her she was just concern about what was going on with him because he called 3am in severe pain and said he went to the ED twice she couldn't understand too much he was saying due to the pain he was in. I did share the conversation I had with patient on 11/4 about his back pain and incoherency with me I advise her to get him to visit the ED if his pain continues or worsen.

## 2018-11-14 NOTE — ED Notes (Signed)
OBSERVE PT WALK OUT TRIAGE LOBBY.

## 2018-11-14 NOTE — Progress Notes (Signed)
David Hendricks - 68 y.o. male MRN [id number]  Date of birth: October 31, 1950   This visit type was conducted due to national recommendations for restrictions regarding the COVID-19 Pandemic (e.g. social distancing).  This format is felt to be most appropriate for this patient at this time.  All issues noted in this document were discussed and addressed.  No physical exam was performed (except for noted visual exam findings with Video Visits).  I discussed the limitations of evaluation and management by telemedicine and the availability of in person appointments. The patient expressed understanding and agreed to proceed.  I connected with@ on 11/14/18 at  9:00 AM EST by a video enabled telemedicine application and verified that I am speaking with the correct person using two identifiers.  Present for VV: Darcus Austin   Patient Location: [facility] [place] [address]   Provider location:   Home office  Chief Complaint  Patient presents with  . Back Pain    pt is c/o back,bad and kidneys area painful,dry mouth and headache/started 3 days go/went to ED-- not better. FYI--pt is staying home by himself, no vital sign to share,pain scale 4-8    HPI  David Hendricks is a 68 y.o. male who presents via audio/video conferencing for a telehealth visit today.  He has complaint of abdominal pain, back pain, nausea with vomiting, dry mouth and headache.  Symptoms started about 3 days ago. He has been to ED twice and had abdominal US as well as CT abdomen.  There was some gallbladder sludge without cholecystitis and CT with lung nodules but no abdominal pathology.  WBC elevated but lab work otherwise unremarkable.  UA negative.  He reports he does not want to get out of bed due to pain.  He has not had a BM in a few days.  He has tried muscle relaxer and meloxicam.  He denies fever, chills, shortness of breath, chest tightness, blood in stool, lower back/buttock pain, pain with urination  or urgency.     ROS:  A comprehensive ROS was completed and negative except as noted per HPI  Past Medical History:  Diagnosis Date  . Anxiety   . Arthritis   . Coronary artery disease    10/18 PCI/DES to pLAD, mild nonobstructive disease in the Lcx/RCA. Normal EF.   Marland Kitchen Dysrhythmia 2016   irregular heartbeat  . Myocardial infarction ([place])   . Seasonal allergies     Past Surgical History:  Procedure Laterality Date  . CORONARY ANGIOPLASTY  10/09/2016  . CORONARY STENT INTERVENTION N/A 10/09/2016   Procedure: CORONARY STENT INTERVENTION;  Surgeon: Nelva Bush, MD;  Location: [facility];  Service: Cardiovascular;  Laterality: N/A;  . INTRAVASCULAR ULTRASOUND/IVUS N/A 10/09/2016   Procedure: Intravascular Ultrasound/IVUS;  Surgeon: Nelva Bush, MD;  Location: [facility];  Service: Cardiovascular;  Laterality: N/A;  . LEFT HEART CATH AND CORONARY ANGIOGRAPHY N/A 10/09/2016   Procedure: LEFT HEART CATH AND CORONARY ANGIOGRAPHY;  Surgeon: Nelva Bush, MD;  Location: [facility];  Service: Cardiovascular;  Laterality: N/A;    Family History  Problem Relation Age of Onset  . Cancer Father     Social History   Socioeconomic History  . Marital status: Divorced    Spouse name: Not on file  . Number of children: Not on file  . Years of education: Not on file  . Highest education level: Not on file  Occupational History  . Not on  file  Social Needs  . Financial resource strain: Not on file  . Food insecurity    Worry: Not on file    Inability: Not on file  . Transportation needs    Medical: Not on file    Non-medical: Not on file  Tobacco Use  . Smoking status: Current Every Day Smoker    Packs/day: 2.00    Years: 45.00    Pack years: 90.00    Types: Cigarettes    Start date: 01/09/1961  . Smokeless tobacco: Never Used  . Tobacco comment: thinking about after settling in   Substance and Sexual Activity  . Alcohol use: No  . Drug use: No   . Sexual activity: Not on file  Lifestyle  . Physical activity    Days per week: Not on file    Minutes per session: Not on file  . Stress: Not on file  Relationships  . Social Herbalist on phone: Not on file    Gets together: Not on file    Attends religious service: Not on file    Active member of club or organization: Not on file    Attends meetings of clubs or organizations: Not on file    Relationship status: Not on file  . Intimate partner violence    Fear of current or ex partner: Not on file    Emotionally abused: Not on file    Physically abused: Not on file    Forced sexual activity: Not on file  Other Topics Concern  . Not on file  Social History Narrative  . Not on file     Current Outpatient Medications:  .  meclizine (ANTIVERT) 25 MG tablet, Take 1 tablet (25 mg total) by mouth 3 (three) times daily as needed for dizziness., Disp: 30 tablet, Rfl: 0 .  meloxicam (MOBIC) 15 MG tablet, TAKE 1 TABLET(15 MG) BY MOUTH DAILY. DO NOT TAKE WITH ALEVE OR IBUPROFEN (Patient taking differently: Take 15 mg by mouth daily. ), Disp: 90 tablet, Rfl: 1 .  methocarbamol (ROBAXIN) 500 MG tablet, Take 1 tablet (500 mg total) by mouth 2 (two) times daily. (Patient taking differently: Take 500 mg by mouth 2 (two) times daily as needed for muscle spasms. ), Disp: 20 tablet, Rfl: 0 .  naproxen sodium (ALEVE) 220 MG tablet, Take 220 mg by mouth daily as needed (pain)., Disp: , Rfl:  .  nitroGLYCERIN (NITROSTAT) 0.4 MG SL tablet, Place 1 tablet (0.4 mg total) under the tongue every 5 (five) minutes x 3 doses as needed for chest pain., Disp: 25 tablet, Rfl: 0 .  tamsulosin (FLOMAX) 0.4 MG CAPS capsule, Take 2 capsules (0.8 mg total) by mouth daily., Disp: 30 capsule, Rfl: 3 .  traZODone (DESYREL) 100 MG tablet, Take 0.5-1 tablets (50-100 mg total) by mouth at bedtime as needed for sleep., Disp: 30 tablet, Rfl: 2 .  aspirin 81 MG EC tablet, Take 1 tablet (81 mg total) by mouth daily.  (Patient not taking: Reported on 11/13/2018), Disp: , Rfl:  .  metoprolol tartrate (LOPRESSOR) 25 MG tablet, Take 0.5 tablets (12.5 mg total) by mouth 2 (two) times daily. (Patient not taking: Reported on 09/17/2018), Disp: 30 tablet, Rfl: 11 .  ticagrelor (BRILINTA) 90 MG TABS tablet, Take 1 tablet (90 mg total) by mouth 2 (two) times daily. (Patient not taking: Reported on 09/17/2018), Disp: 180 tablet, Rfl: 2 .  traMADol (ULTRAM) 50 MG tablet, Take 1 tablet (50 mg total) by mouth  every 12 (twelve) hours as needed for up to 5 days., Disp: 8 tablet, Rfl: 0  EXAM:  VITALS per patient if applicable: Ht 6\' 1"  (1.854 m)   BMI 27.71 kg/m   GENERAL: alert, oriented, laying in bed smoking a cigarette.    HEENT: atraumatic, conjunttiva clear, no obvious abnormalities on inspection of external nose and ears  NECK: normal movements of the head and neck  LUNGS: on inspection no signs of respiratory distress, breathing rate appears normal, no obvious gross SOB, gasping or wheezing  CV: no obvious cyanosis  MS: moves all visible extremities without noticeable abnormality  PSYCH/NEURO: pleasant and cooperative, no obvious depression or anxiety, speech and thought processing grossly intact  ASSESSMENT AND PLAN:  Discussed the following assessment and plan:  Abdominal pain -Unclear cause, no colitis/diverticultiis noted on CT scan.  ?viral illness. He declines COVID testing at this time. Differential also includes ischemic pathology however CT without any changes  -No ureteral stone noted on CT. Unlikely to be prostate given no urinary symptoms and pain is not in lower back.  -Discussed that if symptoms were to worsen I would recommend he return to ED. -Will send in short term tramadol to help with pain control for now.  -Will consider referral to GI if this persists.          I discussed the assessment and treatment plan with the patient. The patient was provided an opportunity to ask questions  and all were answered. The patient agreed with the plan and demonstrated an understanding of the instructions.   The patient was advised to call back or seek an in-person evaluation if the symptoms worsen or if the condition fails to improve as anticipated.   [name], DO

## 2018-11-14 NOTE — ED Notes (Signed)
RETURNED FROM OUTSIDE

## 2018-11-14 NOTE — Telephone Encounter (Signed)
Please see what her concerns are.  When I spoke with him this morning he seemed pretty coherent.  He declined COVID testing at this time.  Recommended that if his symptoms worsen to return to ED.

## 2018-11-14 NOTE — ED Triage Notes (Signed)
Per EMS, states he has been to ED for same symptoms twice this week-states he hasn't gotten any better-abdominal work up was negative

## 2018-11-15 ENCOUNTER — Emergency Department (HOSPITAL_COMMUNITY): Payer: Medicare Other

## 2018-11-15 ENCOUNTER — Other Ambulatory Visit: Payer: Self-pay

## 2018-11-15 ENCOUNTER — Inpatient Hospital Stay (HOSPITAL_COMMUNITY)
Admission: EM | Admit: 2018-11-15 | Discharge: 2018-11-19 | DRG: 408 | Disposition: A | Discharge status: Home / Self Care | Payer: Medicare Other | Attending: Pulmonary Disease | Admitting: Pulmonary Disease

## 2018-11-15 ENCOUNTER — Inpatient Hospital Stay (HOSPITAL_COMMUNITY): Payer: Medicare Other

## 2018-11-15 ENCOUNTER — Encounter (HOSPITAL_COMMUNITY): Payer: Self-pay | Admitting: *Deleted

## 2018-11-15 DIAGNOSIS — R0602 Shortness of breath: Secondary | ICD-10-CM | POA: Diagnosis not present

## 2018-11-15 DIAGNOSIS — R918 Other nonspecific abnormal finding of lung field: Secondary | ICD-10-CM | POA: Diagnosis not present

## 2018-11-15 DIAGNOSIS — K8000 Calculus of gallbladder with acute cholecystitis without obstruction: Principal | ICD-10-CM | POA: Diagnosis present

## 2018-11-15 DIAGNOSIS — E049 Nontoxic goiter, unspecified: Secondary | ICD-10-CM | POA: Diagnosis present

## 2018-11-15 DIAGNOSIS — J9811 Atelectasis: Secondary | ICD-10-CM | POA: Diagnosis not present

## 2018-11-15 DIAGNOSIS — M1990 Unspecified osteoarthritis, unspecified site: Secondary | ICD-10-CM | POA: Diagnosis present

## 2018-11-15 DIAGNOSIS — Z791 Long term (current) use of non-steroidal anti-inflammatories (NSAID): Secondary | ICD-10-CM | POA: Diagnosis not present

## 2018-11-15 DIAGNOSIS — N200 Calculus of kidney: Secondary | ICD-10-CM | POA: Diagnosis present

## 2018-11-15 DIAGNOSIS — Z720 Tobacco use: Secondary | ICD-10-CM | POA: Diagnosis not present

## 2018-11-15 DIAGNOSIS — F10239 Alcohol dependence with withdrawal, unspecified: Secondary | ICD-10-CM | POA: Diagnosis not present

## 2018-11-15 DIAGNOSIS — T85628A Displacement of other specified internal prosthetic devices, implants and grafts, initial encounter: Secondary | ICD-10-CM | POA: Diagnosis not present

## 2018-11-15 DIAGNOSIS — K828 Other specified diseases of gallbladder: Secondary | ICD-10-CM | POA: Diagnosis not present

## 2018-11-15 DIAGNOSIS — Z79899 Other long term (current) drug therapy: Secondary | ICD-10-CM | POA: Diagnosis not present

## 2018-11-15 DIAGNOSIS — R52 Pain, unspecified: Secondary | ICD-10-CM | POA: Diagnosis not present

## 2018-11-15 DIAGNOSIS — R531 Weakness: Secondary | ICD-10-CM | POA: Diagnosis not present

## 2018-11-15 DIAGNOSIS — E872 Acidosis: Secondary | ICD-10-CM | POA: Diagnosis present

## 2018-11-15 DIAGNOSIS — K819 Cholecystitis, unspecified: Secondary | ICD-10-CM | POA: Diagnosis present

## 2018-11-15 DIAGNOSIS — R7989 Other specified abnormal findings of blood chemistry: Secondary | ICD-10-CM | POA: Diagnosis not present

## 2018-11-15 DIAGNOSIS — Z87442 Personal history of urinary calculi: Secondary | ICD-10-CM

## 2018-11-15 DIAGNOSIS — R1011 Right upper quadrant pain: Secondary | ICD-10-CM | POA: Diagnosis not present

## 2018-11-15 DIAGNOSIS — Z20828 Contact with and (suspected) exposure to other viral communicable diseases: Secondary | ICD-10-CM | POA: Diagnosis present

## 2018-11-15 DIAGNOSIS — T85520A Displacement of bile duct prosthesis, initial encounter: Secondary | ICD-10-CM | POA: Diagnosis not present

## 2018-11-15 DIAGNOSIS — N400 Enlarged prostate without lower urinary tract symptoms: Secondary | ICD-10-CM | POA: Diagnosis not present

## 2018-11-15 DIAGNOSIS — T85520S Displacement of bile duct prosthesis, sequela: Secondary | ICD-10-CM

## 2018-11-15 DIAGNOSIS — Y92230 Patient room in hospital as the place of occurrence of the external cause: Secondary | ICD-10-CM | POA: Diagnosis not present

## 2018-11-15 DIAGNOSIS — T50915A Adverse effect of multiple unspecified drugs, medicaments and biological substances, initial encounter: Secondary | ICD-10-CM | POA: Diagnosis not present

## 2018-11-15 DIAGNOSIS — K2289 Other specified disease of esophagus: Secondary | ICD-10-CM

## 2018-11-15 DIAGNOSIS — G4734 Idiopathic sleep related nonobstructive alveolar hypoventilation: Secondary | ICD-10-CM | POA: Diagnosis not present

## 2018-11-15 DIAGNOSIS — K810 Acute cholecystitis: Secondary | ICD-10-CM

## 2018-11-15 DIAGNOSIS — F17200 Nicotine dependence, unspecified, uncomplicated: Secondary | ICD-10-CM | POA: Diagnosis not present

## 2018-11-15 DIAGNOSIS — G4733 Obstructive sleep apnea (adult) (pediatric): Secondary | ICD-10-CM | POA: Diagnosis present

## 2018-11-15 DIAGNOSIS — J189 Pneumonia, unspecified organism: Secondary | ICD-10-CM | POA: Diagnosis not present

## 2018-11-15 DIAGNOSIS — R739 Hyperglycemia, unspecified: Secondary | ICD-10-CM | POA: Diagnosis present

## 2018-11-15 DIAGNOSIS — R1084 Generalized abdominal pain: Secondary | ICD-10-CM | POA: Diagnosis not present

## 2018-11-15 DIAGNOSIS — R109 Unspecified abdominal pain: Secondary | ICD-10-CM | POA: Diagnosis not present

## 2018-11-15 DIAGNOSIS — Z9114 Patient's other noncompliance with medication regimen: Secondary | ICD-10-CM

## 2018-11-15 DIAGNOSIS — F17210 Nicotine dependence, cigarettes, uncomplicated: Secondary | ICD-10-CM | POA: Diagnosis present

## 2018-11-15 DIAGNOSIS — R0902 Hypoxemia: Secondary | ICD-10-CM

## 2018-11-15 DIAGNOSIS — R0689 Other abnormalities of breathing: Secondary | ICD-10-CM | POA: Diagnosis not present

## 2018-11-15 DIAGNOSIS — J42 Unspecified chronic bronchitis: Secondary | ICD-10-CM | POA: Diagnosis present

## 2018-11-15 DIAGNOSIS — I2510 Atherosclerotic heart disease of native coronary artery without angina pectoris: Secondary | ICD-10-CM | POA: Diagnosis present

## 2018-11-15 DIAGNOSIS — N401 Enlarged prostate with lower urinary tract symptoms: Secondary | ICD-10-CM | POA: Diagnosis present

## 2018-11-15 DIAGNOSIS — E876 Hypokalemia: Secondary | ICD-10-CM | POA: Diagnosis not present

## 2018-11-15 DIAGNOSIS — Z4682 Encounter for fitting and adjustment of non-vascular catheter: Secondary | ICD-10-CM | POA: Diagnosis not present

## 2018-11-15 DIAGNOSIS — Y95 Nosocomial condition: Secondary | ICD-10-CM | POA: Diagnosis not present

## 2018-11-15 DIAGNOSIS — J439 Emphysema, unspecified: Secondary | ICD-10-CM | POA: Diagnosis not present

## 2018-11-15 DIAGNOSIS — R350 Frequency of micturition: Secondary | ICD-10-CM | POA: Diagnosis present

## 2018-11-15 DIAGNOSIS — E871 Hypo-osmolality and hyponatremia: Secondary | ICD-10-CM | POA: Diagnosis not present

## 2018-11-15 DIAGNOSIS — R1032 Left lower quadrant pain: Secondary | ICD-10-CM | POA: Diagnosis not present

## 2018-11-15 DIAGNOSIS — J984 Other disorders of lung: Secondary | ICD-10-CM | POA: Diagnosis not present

## 2018-11-15 DIAGNOSIS — I252 Old myocardial infarction: Secondary | ICD-10-CM

## 2018-11-15 DIAGNOSIS — K228 Other specified diseases of esophagus: Secondary | ICD-10-CM

## 2018-11-15 DIAGNOSIS — Z955 Presence of coronary angioplasty implant and graft: Secondary | ICD-10-CM

## 2018-11-15 DIAGNOSIS — J9601 Acute respiratory failure with hypoxia: Secondary | ICD-10-CM | POA: Diagnosis not present

## 2018-11-15 DIAGNOSIS — R110 Nausea: Secondary | ICD-10-CM | POA: Diagnosis not present

## 2018-11-15 DIAGNOSIS — Z7982 Long term (current) use of aspirin: Secondary | ICD-10-CM

## 2018-11-15 LAB — COMPREHENSIVE METABOLIC PANEL
ALT: 67 U/L — ABNORMAL HIGH (ref 0–44)
AST: 57 U/L — ABNORMAL HIGH (ref 15–41)
Albumin: 3.4 g/dL — ABNORMAL LOW (ref 3.5–5.0)
Alkaline Phosphatase: 115 U/L (ref 38–126)
Anion gap: 11 (ref 5–15)
BUN: 12 mg/dL (ref 8–23)
CO2: 27 mmol/L (ref 22–32)
Calcium: 9.2 mg/dL (ref 8.9–10.3)
Chloride: 94 mmol/L — ABNORMAL LOW (ref 98–111)
Creatinine, Ser: 0.97 mg/dL (ref 0.61–1.24)
GFR calc Af Amer: >60 mL/min (ref >60)
GFR calc non Af Amer: >60 mL/min (ref >60)
Glucose, Bld: 141 mg/dL — ABNORMAL HIGH (ref 70–99)
Potassium: 3.6 mmol/L (ref 3.5–5.1)
Sodium: 132 mmol/L — ABNORMAL LOW (ref 135–145)
Total Bilirubin: 2.5 mg/dL — ABNORMAL HIGH (ref 0.3–1.2)
Total Protein: 6.6 g/dL (ref 6.5–8.1)

## 2018-11-15 LAB — HIV ANTIBODY (ROUTINE TESTING W REFLEX): HIV Screen 4th Generation wRfx: NONREACTIVE

## 2018-11-15 LAB — ETHANOL: Alcohol, Ethyl (B): <10 mg/dL (ref <10)

## 2018-11-15 LAB — D-DIMER, QUANTITATIVE: D-Dimer, Quant: 1.86 ug/mL-FEU — ABNORMAL HIGH (ref 0.00–0.50)

## 2018-11-15 LAB — LIPASE, BLOOD: Lipase: 23 U/L (ref 11–51)

## 2018-11-15 LAB — ACETAMINOPHEN LEVEL: Acetaminophen (Tylenol), Serum: <10 ug/mL — ABNORMAL LOW (ref 10–30)

## 2018-11-15 LAB — CBC
HCT: 51.3 % (ref 39.0–52.0)
Hemoglobin: 17.5 g/dL — ABNORMAL HIGH (ref 13.0–17.0)
MCH: 28.5 pg (ref 26.0–34.0)
MCHC: 34.1 g/dL (ref 30.0–36.0)
MCV: 83.6 fL (ref 80.0–100.0)
Platelets: 132 10*3/uL — ABNORMAL LOW (ref 150–400)
RBC: 6.14 MIL/uL — ABNORMAL HIGH (ref 4.22–5.81)
RDW: 12.5 % (ref 11.5–15.5)
WBC: 19.5 10*3/uL — ABNORMAL HIGH (ref 4.0–10.5)
nRBC: 0 % (ref 0.0–0.2)

## 2018-11-15 LAB — SARS CORONAVIRUS 2 (TAT 6-24 HRS): SARS Coronavirus 2: NEGATIVE

## 2018-11-15 LAB — PROTIME-INR
INR: 1 (ref 0.8–1.2)
Prothrombin Time: 13.2 seconds (ref 11.4–15.2)

## 2018-11-15 MED ORDER — ONDANSETRON HCL 4 MG PO TABS: 4.0000 mg | ORAL_TABLET | Freq: Four times a day (QID) | ORAL | Status: DC | PRN

## 2018-11-15 MED ORDER — SODIUM CHLORIDE 0.9 % IV BOLUS
1000.0000 mL | Freq: Once | INTRAVENOUS | Status: AC
  Administered 2018-11-15: 1000 mL via INTRAVENOUS

## 2018-11-15 MED ORDER — ADULT MULTIVITAMIN W/MINERALS CH
1.0000 | ORAL_TABLET | Freq: Every day | ORAL | Status: DC
  Administered 2018-11-15 – 2018-11-17 (×3): 1 via ORAL
  Filled 2018-11-15 (×3): qty 1

## 2018-11-15 MED ORDER — TRAZODONE HCL 50 MG PO TABS
50.0000 mg | ORAL_TABLET | Freq: Every evening | ORAL | Status: DC | PRN
  Administered 2018-11-15: 100 mg via ORAL
  Filled 2018-11-15: qty 2

## 2018-11-15 MED ORDER — ONDANSETRON HCL 4 MG/2ML IJ SOLN
4.0000 mg | Freq: Four times a day (QID) | INTRAMUSCULAR | Status: DC | PRN
  Administered 2018-11-16: 4 mg via INTRAVENOUS
  Filled 2018-11-15 (×2): qty 2

## 2018-11-15 MED ORDER — ACETAMINOPHEN 650 MG RE SUPP
650.0000 mg | Freq: Four times a day (QID) | RECTAL | Status: DC | PRN
  Administered 2018-11-17: 650 mg via RECTAL
  Filled 2018-11-15: qty 1

## 2018-11-15 MED ORDER — ACETAMINOPHEN 325 MG PO TABS: 650.0000 mg | ORAL_TABLET | Freq: Four times a day (QID) | ORAL | Status: DC | PRN

## 2018-11-15 MED ORDER — SODIUM CHLORIDE 0.9 % IV SOLN
2.0000 g | Freq: Once | INTRAVENOUS | Status: AC
  Administered 2018-11-15: 2 g via INTRAVENOUS
  Filled 2018-11-15: qty 20

## 2018-11-15 MED ORDER — IOHEXOL 350 MG/ML SOLN
100.0000 mL | Freq: Once | INTRAVENOUS | Status: AC | PRN
  Administered 2018-11-15: 100 mL via INTRAVENOUS

## 2018-11-15 MED ORDER — LORAZEPAM 2 MG/ML IJ SOLN
1.0000 mg | INTRAMUSCULAR | Status: DC | PRN
  Administered 2018-11-15: 1 mg via INTRAVENOUS
  Administered 2018-11-16: 4 mg via INTRAVENOUS
  Administered 2018-11-17 (×2): 2 mg via INTRAVENOUS
  Filled 2018-11-15 (×3): qty 1
  Filled 2018-11-15: qty 2
  Filled 2018-11-15: qty 1

## 2018-11-15 MED ORDER — TAMSULOSIN HCL 0.4 MG PO CAPS
0.8000 mg | ORAL_CAPSULE | Freq: Every day | ORAL | Status: DC
  Administered 2018-11-16 – 2018-11-17 (×2): 0.8 mg via ORAL
  Filled 2018-11-15 (×3): qty 2

## 2018-11-15 MED ORDER — MORPHINE SULFATE (PF) 2 MG/ML IV SOLN
2.0000 mg | INTRAVENOUS | Status: DC | PRN
  Administered 2018-11-16 – 2018-11-17 (×3): 2 mg via INTRAVENOUS
  Filled 2018-11-15 (×3): qty 1

## 2018-11-15 MED ORDER — METHOCARBAMOL 500 MG PO TABS: 500.0000 mg | ORAL_TABLET | Freq: Two times a day (BID) | ORAL | Status: DC | PRN

## 2018-11-15 MED ORDER — FENTANYL CITRATE (PF) 100 MCG/2ML IJ SOLN
50.0000 ug | Freq: Once | INTRAMUSCULAR | Status: AC
  Administered 2018-11-15: 50 ug via INTRAVENOUS
  Filled 2018-11-15: qty 2

## 2018-11-15 MED ORDER — TRAMADOL HCL 50 MG PO TABS: 50.0000 mg | ORAL_TABLET | Freq: Two times a day (BID) | ORAL | Status: DC | PRN

## 2018-11-15 MED ORDER — PIPERACILLIN-TAZOBACTAM 3.375 G IVPB
3.3750 g | Freq: Three times a day (TID) | INTRAVENOUS | Status: DC
  Administered 2018-11-15 – 2018-11-16 (×4): 3.375 g via INTRAVENOUS
  Filled 2018-11-15 (×4): qty 50

## 2018-11-15 MED ORDER — VITAMIN B-1 100 MG PO TABS
100.0000 mg | ORAL_TABLET | Freq: Every day | ORAL | Status: DC
  Administered 2018-11-16: 100 mg via ORAL
  Filled 2018-11-15: qty 1

## 2018-11-15 MED ORDER — SODIUM CHLORIDE 0.9% FLUSH: 3.0000 mL | Freq: Once | INTRAVENOUS | Status: DC

## 2018-11-15 MED ORDER — NICOTINE 21 MG/24HR TD PT24
21.0000 mg | MEDICATED_PATCH | Freq: Every day | TRANSDERMAL | Status: DC
  Administered 2018-11-15 – 2018-11-17 (×3): 21 mg via TRANSDERMAL
  Filled 2018-11-15 (×3): qty 1

## 2018-11-15 MED ORDER — FOLIC ACID 1 MG PO TABS
1.0000 mg | ORAL_TABLET | Freq: Every day | ORAL | Status: DC
  Administered 2018-11-15 – 2018-11-17 (×3): 1 mg via ORAL
  Filled 2018-11-15 (×3): qty 1

## 2018-11-15 MED ORDER — ALUM & MAG HYDROXIDE-SIMETH 200-200-20 MG/5ML PO SUSP
30.0000 mL | Freq: Once | ORAL | Status: AC
  Administered 2018-11-15: 30 mL via ORAL
  Filled 2018-11-15: qty 30

## 2018-11-15 MED ORDER — LACTATED RINGERS IV SOLN
INTRAVENOUS | Status: DC
  Administered 2018-11-15 – 2018-11-17 (×5): via INTRAVENOUS

## 2018-11-15 MED ORDER — LORAZEPAM 1 MG PO TABS: 1.0000 mg | ORAL_TABLET | ORAL | Status: DC | PRN

## 2018-11-15 MED ORDER — ONDANSETRON HCL 4 MG/2ML IJ SOLN
4.0000 mg | Freq: Once | INTRAMUSCULAR | Status: AC
  Administered 2018-11-15: 4 mg via INTRAVENOUS
  Filled 2018-11-15: qty 2

## 2018-11-15 MED ORDER — DOCUSATE SODIUM 100 MG PO CAPS
100.0000 mg | ORAL_CAPSULE | Freq: Two times a day (BID) | ORAL | Status: DC
  Administered 2018-11-16 – 2018-11-17 (×2): 100 mg via ORAL
  Filled 2018-11-15 (×3): qty 1

## 2018-11-15 MED ORDER — METOCLOPRAMIDE HCL 5 MG/ML IJ SOLN
10.0000 mg | Freq: Once | INTRAMUSCULAR | Status: AC
  Administered 2018-11-15: 10 mg via INTRAVENOUS
  Filled 2018-11-15: qty 2

## 2018-11-15 MED ORDER — LIDOCAINE VISCOUS HCL 2 % MT SOLN
15.0000 mL | Freq: Once | OROMUCOSAL | Status: AC
  Administered 2018-11-15: 15 mL via ORAL
  Filled 2018-11-15: qty 15

## 2018-11-15 MED ORDER — THIAMINE HCL 100 MG/ML IJ SOLN
100.0000 mg | Freq: Every day | INTRAMUSCULAR | Status: DC
  Administered 2018-11-15 – 2018-11-18 (×3): 100 mg via INTRAVENOUS
  Filled 2018-11-15 (×3): qty 2

## 2018-11-15 NOTE — H&P (Signed)
Chief Complaint: Acute cholecystitis  Referring Physician(s): Donne Hazel  Supervising Physician: Aletta Edouard  Patient Status: [facility] - In-pt  History of Present Illness: David Hendricks is a 68 y.o. male who presented to the ED today with RUQ pain.  He presented yesterday as well c/o the same as well as N/V and headache.  States it started about 3 days ago.  [place] today showed= Extensive sludge throughout the gallbladder with tiny intermingled gallstones. There is gallbladder wall thickening and pericholecystic Fluid.  He is very tender in the RUQ as asked "you aren't gonna push on my stomach again are ya? I don't think I can take it".  WBC is 19.5 which is up from 14.8.  Tbili is 2.5 Up from 1.3.  He has a h/o CAD with coronary stent placement. He used to take Brilinta but confirms he has not taken this in some time and can't even remember when the last time he took it.  He is NPO.  General Surgery feels he would need cardiac clearance prior to cholecystectomy so we are asked to evaluate for percutaneous cholecystotomy.   He is also mildly hypoxic and CTA to eval for PE is pending.  Past Medical History:  Diagnosis Date  . Anxiety   . Arthritis   . Coronary artery disease    10/18 PCI/DES to pLAD, mild nonobstructive disease in the Lcx/RCA. Normal EF.   Marland Kitchen Dysrhythmia 2016   irregular heartbeat  . Myocardial infarction ([place])   . Seasonal allergies     Past Surgical History:  Procedure Laterality Date  . CORONARY ANGIOPLASTY  10/09/2016  . CORONARY STENT INTERVENTION N/A 10/09/2016   Procedure: CORONARY STENT INTERVENTION;  Surgeon: Nelva Bush, MD;  Location: [place] [facility];  Service: Cardiovascular;  Laterality: N/A;  . INTRAVASCULAR ULTRASOUND/IVUS N/A 10/09/2016   Procedure: Intravascular Ultrasound/IVUS;  Surgeon: Nelva Bush, MD;  Location: [place] [facility];  Service: Cardiovascular;  Laterality: N/A;  . LEFT HEART CATH AND CORONARY ANGIOGRAPHY  N/A 10/09/2016   Procedure: LEFT HEART CATH AND CORONARY ANGIOGRAPHY;  Surgeon: Nelva Bush, MD;  Location: [place] [facility];  Service: Cardiovascular;  Laterality: N/A;    Allergies: Patient has no known allergies.  Medications: Prior to Admission medications   Medication Sig Start Date End Date Taking? Authorizing Provider  aspirin 81 MG EC tablet Take 1 tablet (81 mg total) by mouth daily. Patient not taking: Reported on 11/13/2018 10/10/16   Cheryln Manly, NP  meclizine (ANTIVERT) 25 MG tablet Take 1 tablet (25 mg total) by mouth 3 (three) times daily as needed for dizziness. 05/10/17   [name], DO  meloxicam (MOBIC) 15 MG tablet TAKE 1 TABLET(15 MG) BY MOUTH DAILY. DO NOT TAKE WITH ALEVE OR IBUPROFEN Patient taking differently: Take 15 mg by mouth daily.  06/21/17   [name], DO  methocarbamol (ROBAXIN) 500 MG tablet Take 1 tablet (500 mg total) by mouth 2 (two) times daily. Patient taking differently: Take 500 mg by mouth 2 (two) times daily as needed for muscle spasms.  09/17/18   Lacretia Leigh, MD  metoprolol tartrate (LOPRESSOR) 25 MG tablet Take 0.5 tablets (12.5 mg total) by mouth 2 (two) times daily. Patient not taking: Reported on 09/17/2018 01/30/17   [name], MD  naproxen sodium (ALEVE) 220 MG tablet Take 220 mg by mouth daily as needed (pain).    [provider]  nitroGLYCERIN (NITROSTAT) 0.4 MG SL tablet Place 1 tablet (0.4 mg total) under  the tongue every 5 (five) minutes x 3 doses as needed for chest pain. 01/24/17   Geradine Girt, DO  tamsulosin (FLOMAX) 0.4 MG CAPS capsule Take 2 capsules (0.8 mg total) by mouth daily. 05/02/18   [name], DO  ticagrelor (BRILINTA) 90 MG TABS tablet Take 1 tablet (90 mg total) by mouth 2 (two) times daily. Patient not taking: Reported on 09/17/2018 10/10/16   Cheryln Manly, NP  traMADol (ULTRAM) 50 MG tablet Take 1 tablet (50 mg total) by mouth every 12 (twelve) hours as needed for up to 5  days. 11/14/18 11/19/18  [name], DO  traZODone (DESYREL) 100 MG tablet Take 0.5-1 tablets (50-100 mg total) by mouth at bedtime as needed for sleep. 06/21/17   [name], DO     Family History  Problem Relation Age of Onset  . Cancer Father     Social History   Socioeconomic History  . Marital status: Divorced    Spouse name: Not on file  . Number of children: Not on file  . Years of education: Not on file  . Highest education level: Not on file  Occupational History  . Not on file  Social Needs  . Financial resource strain: Not on file  . Food insecurity    Worry: Not on file    Inability: Not on file  . Transportation needs    Medical: Not on file    Non-medical: Not on file  Tobacco Use  . Smoking status: Current Every Day Smoker    Packs/day: 2.00    Years: 45.00    Pack years: 90.00    Types: Cigarettes    Start date: 01/09/1961  . Smokeless tobacco: Never Used  . Tobacco comment: thinking about after settling in   Substance and Sexual Activity  . Alcohol use: No  . Drug use: No  . Sexual activity: Not on file  Lifestyle  . Physical activity    Days per week: Not on file    Minutes per session: Not on file  . Stress: Not on file  Relationships  . Social Herbalist on phone: Not on file    Gets together: Not on file    Attends religious service: Not on file    Active member of club or organization: Not on file    Attends meetings of clubs or organizations: Not on file    Relationship status: Not on file  Other Topics Concern  . Not on file  Social History Narrative  . Not on file     Review of Systems: A 12 point ROS discussed and pertinent positives are indicated in the HPI above.  All other systems are negative.  Review of Systems  Vital Signs: BP 137/76 (BP Location: Right Arm)   Pulse 95   Temp 98.2 F (36.8 C) (Oral)   Resp 16   Ht 6\' 1"  (1.854 m)   Wt 95.2 kg   SpO2 91%   BMI 27.70 kg/m   Physical Exam Vitals  signs reviewed.  Constitutional:      Appearance: He is well-developed.  HENT:     Head: Normocephalic and atraumatic.  Eyes:     Extraocular Movements: Extraocular movements intact.  Neck:     Musculoskeletal: Normal range of motion.  Cardiovascular:     Rate and Rhythm: Normal rate and regular rhythm.  Pulmonary:     Effort: Pulmonary effort is normal.     Breath sounds: Normal breath  sounds.  Abdominal:     General: Abdomen is flat.     Comments: Patient would not allow me to palpate his abdomen. States "It is very sore, I can't take any more people pushing on it".  Musculoskeletal: Normal range of motion.  Skin:    General: Skin is warm and dry.  Neurological:     General: No focal deficit present.     Mental Status: He is alert and oriented to person, place, and time.  Psychiatric:        Mood and Affect: Mood normal.        Behavior: Behavior normal.        Thought Content: Thought content normal.        Judgment: Judgment normal.     Imaging: Dg Chest 2 View  Result Date: 11/15/2018 CLINICAL DATA:  Hypoxia EXAM: CHEST - 2 VIEW COMPARISON:  06/12/2017 FINDINGS: Heart size and vascularity normal. Mild right lower lobe airspace disease is new since the prior study. Left lung is clear. No effusion or edema. Mild hyperinflation of the lungs. IMPRESSION: Mild right lower lobe airspace disease. Possible pneumonia versus atelectasis. Electronically Signed   By: Franchot Gallo M.D.   On: 11/15/2018 07:57   Ct Abdomen Pelvis W Contrast  Result Date: 11/13/2018 CLINICAL DATA:  Abdominal pain, primarily left lower quadrant region EXAM: CT ABDOMEN AND PELVIS WITH CONTRAST TECHNIQUE: Multidetector CT imaging of the abdomen and pelvis was performed using the standard protocol following bolus administration of intravenous contrast. CONTRAST:  180mL OMNIPAQUE IOHEXOL 300 MG/ML  SOLN COMPARISON:  September 17, 2018, June 12, 2017, and November 29, 2015 FINDINGS: Lower chest: There is right  base atelectatic change. There are small bullae in the inferior lingula. On axial slice 1 series 6, there is a 5 x 3 mm nodular opacity in the lateral segment of the right middle lobe. On axial slice six series 6, there is a stable 2 mm nodular opacity in the lateral segment right middle lobe, unchanged. There is a 2 mm nodular opacity also seen in the lateral segment right middle lobe on axial slice 18 series 6. No lung base edema or consolidation on either side. There is a small hiatal hernia. Hepatobiliary: No focal liver lesions are evident. The gallbladder wall is not appreciably thickened. There is no biliary duct dilatation. Pancreas: There is no pancreatic mass or inflammatory focus. Spleen: No splenic lesions are evident beyond a small calcified splenic granuloma anteriorly and medially. Adrenals/Urinary Tract: Adrenals bilaterally appear unremarkable. There is a 5 mm apparent cyst in the mid right kidney as well as a 3 mm probable cyst in the posterior mid left kidney. There is no evident hydronephrosis on either side. There is a calculus in the upper pole of the left kidney measuring 7 x 6 mm. There is a 1 mm calculus in the mid left kidney. There is a 1 mm calculus in the upper pole left kidney. A 1 mm calculus is also noted in the lower pole of the left kidney. No calculi are demonstrated in the right kidney. There is no evident ureteral calculus on either side. Urinary bladder is mildly distended. The urinary bladder wall thickness is normal. Stomach/Bowel: There is no evident diverticulitis. There is no appreciable bowel wall or mesenteric thickening. There is no evident bowel obstruction. The terminal ileum appears normal. There is no free air or portal venous air. Vascular/Lymphatic: There is no abdominal aortic aneurysm. There is aortic and iliac artery atherosclerosis. No adenopathy  is evident in the abdomen or pelvis. Reproductive: There are prostatic calculi. Prostate and seminal vesicles are  normal in size and contour. No evident pelvic mass. Other: Appendix appears normal. No abscess or ascites is evident in the abdomen or pelvis. Musculoskeletal: There is degenerative change in the lumbar spine. There is vacuum phenomenon at L5-S1. There are no blastic or lytic bone lesions. There are a few scattered apparent bone islands in the pelvis and proximal femur regions, stable. There is no intramuscular or abdominal wall lesion. IMPRESSION: 1. Nonobstructing calculi in the left kidney, largest in the upper pole left kidney region measuring 7 x 6 mm. No hydronephrosis or ureteral calculus on either side. Urinary bladder wall thickness normal. There are prostatic calculi. 2. No diverticulitis evident. No bowel obstruction. No abscess in the abdomen or pelvis. Appendix appears normal. 3. Small nodular opacities in the right middle lobe, largest measuring 5 x 4 mm. A 5 x 4 mm nodular opacity is more superior in location in the other nodular opacities and has not been visualized on previous studies due to different lung base starting points. No follow-up needed if patient is low-risk (and has no known or suspected primary neoplasm). Non-contrast chest CT can be considered in 12 months if patient is high-risk. This recommendation follows the consensus statement: Guidelines for Management of Incidental Pulmonary Nodules Detected on CT Images: From the Fleischner Society 2017; Radiology 2017; 284:228-243. 4.  Aortic Atherosclerosis (ICD10-I70.0). Electronically Signed   By: Lowella Grip III M.D.   On: 11/13/2018 10:58   US Abdomen Limited  Result Date: 11/12/2018 CLINICAL DATA:  Right upper quadrant abdominal pain EXAM: ULTRASOUND ABDOMEN LIMITED RIGHT UPPER QUADRANT COMPARISON:  None. FINDINGS: Gallbladder: The gallbladder is filled with sludge with multiple small stones. There is no gallbladder wall thickening. The sonographic Percell Miller sign is negative. Common bile duct: Diameter: 2 mm Liver: No focal  lesion identified. Within normal limits in parenchymal echogenicity. Portal vein is patent on color Doppler imaging with normal direction of blood flow towards the liver. Other: None. IMPRESSION: Sludge filled gallbladder without evidence for acute cholecystitis. Electronically Signed   By: Constance Holster M.D.   On: 11/12/2018 03:38   US Abdomen Limited Ruq  Result Date: 11/15/2018 CLINICAL DATA:  Right upper quadrant pain EXAM: ULTRASOUND ABDOMEN LIMITED RIGHT UPPER QUADRANT COMPARISON:  November 12, 2018 FINDINGS: Gallbladder: Within the gallbladder, there is extensive sludge with intermingled tiny gallstones throughout the sludge. Currently, the gallbladder wall is thickened, and there is pericholecystic fluid. The patient is focally tender over the gallbladder. Common bile duct: Diameter: 2 mm. No intrahepatic or extrahepatic biliary duct dilatation. Liver: No focal lesion identified. Within normal limits in parenchymal echogenicity. Portal vein is patent on color Doppler imaging with normal direction of blood flow towards the liver. Other: None. IMPRESSION: Extensive sludge throughout the gallbladder with tiny intermingled gallstones. There is gallbladder wall thickening and pericholecystic fluid. Patient is focally tender over the gallbladder. These are findings indicative of acute cholecystitis. Study otherwise unremarkable. Electronically Signed   By: Lowella Grip III M.D.   On: 11/15/2018 08:10    Labs:  CBC: Recent Labs    05/02/18 1332 11/12/18 0240 11/13/18 0934 11/15/18 0555  WBC 8.4 15.4* 14.8* 19.5*  HGB 17.8* 17.6* 17.2* 17.5*  HCT 51.7 53.2* 51.6 51.3  PLT 161.0 155 147* 132*    COAGS: No results for input(s): INR, APTT in the last 8760 hours.  BMP: Recent Labs    05/02/18 1332 11/12/18  0240 11/13/18 0934 11/15/18 0555  NA 140 140 135 132*  K 4.3 3.9 3.7 3.6  CL 101 102 100 94*  CO2 31 29 28 27   GLUCOSE 102* 115* 114* 141*  BUN 10 17 11 12   CALCIUM 9.1  9.2 8.9 9.2  CREATININE 1.03 1.01 0.98 0.97  GFRNONAA  --  >60 >60 >60  GFRAA  --  >60 >60 >60    LIVER FUNCTION TESTS: Recent Labs    05/02/18 1332 11/12/18 0240 11/13/18 0934 11/15/18 0555  BILITOT 0.8 0.5 1.3* 2.5*  AST 15 19 17  57*  ALT 14 17 16  67*  ALKPHOS 98 100 100 115  PROT 6.4 7.0 7.0 6.6  ALBUMIN 4.2 4.0 4.1 3.4*    TUMOR MARKERS: No results for input(s): AFPTM, CEA, CA199, CHROMGRNA in the last 8760 hours.  Assessment and Plan:  Acute cholecystitis.  Poor surgical candidate due to CAD with stents and non-compliance with anti-platelet therapy.  Will proceed with image guided percutaneous cholecystostomy today.  Risks and benefits discussed with the patient including, but not limited to bleeding, infection, gallbladder perforation, bile leak, sepsis or even death.  All of the patient's questions were answered, patient is agreeable to proceed. Consent signed and in chart.  Thank you for this interesting consult.  I greatly enjoyed meeting David Hendricks and look forward to participating in their care.  A copy of this report was sent to the requesting provider on this date.  Electronically Signed: Murrell Redden, PA-C   11/15/2018, 11:05 AM      I spent a total of 40 Minutes  in face to face in clinical consultation, greater than 50% of which was counseling/coordinating care for perc chole.

## 2018-11-15 NOTE — ED Provider Notes (Signed)
[facility] EMERGENCY DEPARTMENT Provider Note  CSN: [id number] Arrival date & time: 11/15/18 0545  Chief Complaint(s) Abdominal Pain  HPI David Hendricks is a 68 y.o. male    Abdominal Pain Pain location:  R flank, RUQ and LLQ Pain quality: aching   Pain radiates to:  Does not radiate Pain severity:  Moderate Onset quality:  Gradual Duration:  3 days Timing:  Intermittent Progression:  Waxing and waning Chronicity:  Recurrent Worsened by:  Nothing Ineffective treatments:  None tried Associated symptoms: constipation, nausea and vomiting   Associated symptoms: no chills, no cough, no diarrhea, no fever and no melena   Risk factors: NSAID use   Risk factors: no alcohol abuse, has not had multiple surgeries and no recent hospitalization     Past Medical History Past Medical History:  Diagnosis Date  . Anxiety   . Arthritis   . Coronary artery disease    10/18 PCI/DES to pLAD, mild nonobstructive disease in the Lcx/RCA. Normal EF.   Marland Kitchen Dysrhythmia 2016   irregular heartbeat  . Myocardial infarction ([place])   . Seasonal allergies    Patient Active Problem List   Diagnosis Date Noted  . Abdominal pain 11/14/2018  . Cellulitis 08/14/2018  . Leg cramping 05/01/2018  . Depression, major, single episode, mild ([place]) 06/21/2017  . Rib pain on left side 06/21/2017  . Pain of finger of left hand 05/21/2017  . Cigarette nicotine dependence without complication [id number]  . Vertigo 05/10/2017  . Old MI (myocardial infarction) 01/30/2017  . CAD (coronary artery disease) 01/30/2017  . Chest pain with moderate risk of acute coronary syndrome 01/23/2017  . Hyperlipidemia 10/10/2016  . History of non-ST elevation myocardial infarction (NSTEMI) 10/09/2016  . Difficulty sleeping 04/22/2016  . Urinary dysfunction 04/22/2016  . Benign prostatic hyperplasia with urinary frequency 02/11/2016  . Subacromial bursitis of right shoulder joint 02/11/2016   Home Medication(s)  Prior to Admission medications   Medication Sig Start Date End Date Taking? Authorizing Provider  aspirin 81 MG EC tablet Take 1 tablet (81 mg total) by mouth daily. Patient not taking: Reported on 11/13/2018 10/10/16   Cheryln Manly, NP  meclizine (ANTIVERT) 25 MG tablet Take 1 tablet (25 mg total) by mouth 3 (three) times daily as needed for dizziness. 05/10/17   [name], DO  meloxicam (MOBIC) 15 MG tablet TAKE 1 TABLET(15 MG) BY MOUTH DAILY. DO NOT TAKE WITH ALEVE OR IBUPROFEN Patient taking differently: Take 15 mg by mouth daily.  06/21/17   [name], DO  methocarbamol (ROBAXIN) 500 MG tablet Take 1 tablet (500 mg total) by mouth 2 (two) times daily. Patient taking differently: Take 500 mg by mouth 2 (two) times daily as needed for muscle spasms.  09/17/18   Lacretia Leigh, MD  metoprolol tartrate (LOPRESSOR) 25 MG tablet Take 0.5 tablets (12.5 mg total) by mouth 2 (two) times daily. Patient not taking: Reported on 09/17/2018 01/30/17   [name], MD  naproxen sodium (ALEVE) 220 MG tablet Take 220 mg by mouth daily as needed (pain).    [provider]  nitroGLYCERIN (NITROSTAT) 0.4 MG SL tablet Place 1 tablet (0.4 mg total) under the tongue every 5 (five) minutes x 3 doses as needed for chest pain. 01/24/17   Geradine Girt, DO  tamsulosin (FLOMAX) 0.4 MG CAPS capsule Take 2 capsules (0.8 mg total) by mouth daily. 05/02/18   [name], DO  ticagrelor (BRILINTA) 90 MG TABS tablet Take 1 tablet (90 mg  total) by mouth 2 (two) times daily. Patient not taking: Reported on 09/17/2018 10/10/16   Cheryln Manly, NP  traMADol (ULTRAM) 50 MG tablet Take 1 tablet (50 mg total) by mouth every 12 (twelve) hours as needed for up to 5 days. 11/14/18 11/19/18  [name], DO  traZODone (DESYREL) 100 MG tablet Take 0.5-1 tablets (50-100 mg total) by mouth at bedtime as needed for sleep. 06/21/17   [name], DO                                                                                                                                     Past Surgical History Past Surgical History:  Procedure Laterality Date  . CORONARY ANGIOPLASTY  10/09/2016  . CORONARY STENT INTERVENTION N/A 10/09/2016   Procedure: CORONARY STENT INTERVENTION;  Surgeon: Nelva Bush, MD;  Location: [facility];  Service: Cardiovascular;  Laterality: N/A;  . INTRAVASCULAR ULTRASOUND/IVUS N/A 10/09/2016   Procedure: Intravascular Ultrasound/IVUS;  Surgeon: Nelva Bush, MD;  Location: [facility];  Service: Cardiovascular;  Laterality: N/A;  . LEFT HEART CATH AND CORONARY ANGIOGRAPHY N/A 10/09/2016   Procedure: LEFT HEART CATH AND CORONARY ANGIOGRAPHY;  Surgeon: Nelva Bush, MD;  Location: [facility];  Service: Cardiovascular;  Laterality: N/A;   Family History Family History  Problem Relation Age of Onset  . Cancer Father     Social History Social History   Tobacco Use  . Smoking status: Current Every Day Smoker    Packs/day: 2.00    Years: 45.00    Pack years: 90.00    Types: Cigarettes    Start date: 01/09/1961  . Smokeless tobacco: Never Used  . Tobacco comment: thinking about after settling in   Substance Use Topics  . Alcohol use: No  . Drug use: No   Allergies Patient has no known allergies.  Review of Systems Review of Systems  Constitutional: Negative for chills and fever.  Respiratory: Negative for cough.   Gastrointestinal: Positive for abdominal pain, constipation, nausea and vomiting. Negative for diarrhea and melena.   All other systems are reviewed and are negative for acute change except as noted in the HPI  Physical Exam Vital Signs  I have reviewed the triage vital signs BP (!) 148/66 (BP Location: Right Arm)   Pulse 86   Temp 98.1 F (36.7 C)   Resp 19   SpO2 94%   Physical Exam Vitals signs reviewed.  Constitutional:      General: He is not in acute distress.    Appearance: He is well-developed. He is not  diaphoretic.  HENT:     Head: Normocephalic and atraumatic.     Jaw: No trismus.     Right Ear: External ear normal.     Left Ear: External ear normal.     Nose: Nose normal.  Eyes:     General: No scleral icterus.  Conjunctiva/sclera: Conjunctivae normal.  Neck:     Musculoskeletal: Normal range of motion.     Trachea: Phonation normal.  Cardiovascular:     Rate and Rhythm: Normal rate and regular rhythm.  Pulmonary:     Effort: Pulmonary effort is normal. No respiratory distress.     Breath sounds: No stridor.  Abdominal:     General: There is no distension.     Tenderness: There is abdominal tenderness in the right upper quadrant. There is no guarding or rebound. Negative signs include Murphy's sign.  Musculoskeletal: Normal range of motion.  Neurological:     Mental Status: He is alert and oriented to person, place, and time.  Psychiatric:        Behavior: Behavior normal.     ED Results and Treatments Labs (all labs ordered are listed, but only abnormal results are displayed) Labs Reviewed  COMPREHENSIVE METABOLIC PANEL - Abnormal; Notable for the following components:      Result Value   Sodium 132 (*)    Chloride 94 (*)    Glucose, Bld 141 (*)    Albumin 3.4 (*)    AST 57 (*)    ALT 67 (*)    Total Bilirubin 2.5 (*)    All other components within normal limits  CBC - Abnormal; Notable for the following components:   WBC 19.5 (*)    RBC 6.14 (*)    Hemoglobin 17.5 (*)    Platelets 132 (*)    All other components within normal limits  LIPASE, BLOOD  ETHANOL  ACETAMINOPHEN LEVEL                                                                                                                         EKG  EKG Interpretation  Date/Time:  Friday November 15 2018 05:50:52 EST Ventricular Rate:  89 PR Interval:    QRS Duration: 116 QT Interval:  373 QTC Calculation: [id number] R Axis:   -84 Text Interpretation: Sinus rhythm Left anterior fascicular block motion  artifact Otherwise no significant change Confirmed by Addison Lank [phone]) on 11/15/2018 6:43:10 AM      Radiology No results found.  Pertinent labs & imaging results that were available during my care of the patient were reviewed by me and considered in my medical decision making (see chart for details).  Medications Ordered in ED Medications  sodium chloride flush (NS) 0.9 % injection 3 mL (has no administration in time range)  metoCLOPramide (REGLAN) injection 10 mg (10 mg Intravenous Given 11/15/18 0641)  sodium chloride 0.9 % bolus 1,000 mL (1,000 mLs Intravenous New Bag/Given 11/15/18 0643)  alum & mag hydroxide-simeth (MAALOX/MYLANTA) 200-200-20 MG/5ML suspension 30 mL (30 mLs Oral Given 11/15/18 0641)    And  lidocaine (XYLOCAINE) 2 % viscous mouth solution 15 mL (15 mLs Oral Given 11/15/18 0641)  Procedures Procedures  (including critical care time)  Medical Decision Making / ED Course I have reviewed the nursing notes for this encounter and the patient's prior records (if available in EHR or on provided paperwork).   David Hendricks was evaluated in Emergency Department on 11/15/2018 for the symptoms described in the history of present illness. He was evaluated in the context of the global COVID-19 pandemic, which necessitated consideration that the patient might be at risk for infection with the SARS-CoV-2 virus that causes COVID-19. Institutional protocols and algorithms that pertain to the evaluation of patients at risk for COVID-19 are in a state of rapid change based on information released by regulatory bodies including the CDC and federal and state organizations. These policies and algorithms were followed during the patient's care in the ED.  Worsening leukocytosis and LFTs. Prior US with biliary sludge. Will repeat US to assess for cholecystitis.  Possible symptomatic biliary disease.   Patient care turned over to Dr Wyvonnia Dusky. Patient case and results discussed in detail; please see their note for further ED managment.       Final Clinical Impression(s) / ED Diagnoses Final diagnoses:  RUQ pain      This chart was dictated using voice recognition software.  Despite best efforts to proofread,  errors can occur which can change the documentation meaning.   Fatima Blank, MD 11/15/18 [phone]

## 2018-11-15 NOTE — H&P (Signed)
History and Physical    David Hendricks DOB: Dec 03, 1950 DOA: 11/15/2018  PCP: [name], DO Consultants:  [place] - orthopedics; [place] - sleep medicine; [place] - cardiology Patient coming from:  Home - lives alone; NOK: Brother Merry Proud) and sister-in-law [name]), [phone]  Chief Complaint:  Abdominal pain  HPI: David Hendricks is a 68 y.o. male with medical history significant of CAD s/p PCI and BPH presenting with abdominal pain.  He reports right-sided abdominal pain that has been present for over a week, 4 ER visits.  +N/v.  Normal BMs.  No fever.  No COVID contacts.  Denies cough or feeling SOB.  He was seen in the ER for flank pain on 9/8 with unremarkable UA and CT.  He was seen again on 11/3 and 4 for RUQ pain; [place] negative for cholecystitis and CT with gallbladder sludge and pulmonary nodules.    ED Course:   4th visit of the week for RUQ pain - [place] with sludge, CT with nephrolithiasis, lung nodules.  88-89% on RA, CXR with ?infiltrate.  WBC uptrending, LFTs trending up.  Sludge and gallstones today.  CTA pending for chest - denies feeling symptoms there.  Surgery to see.  Likely need cholecystectomy, COVID pending.  Review of Systems: As per HPI; otherwise review of systems reviewed and negative.   Ambulatory Status:  Ambulates without assistance  Past Medical History:  Diagnosis Date  . Anxiety   . Arthritis   . Coronary artery disease    10/18 PCI/DES to pLAD, mild nonobstructive disease in the Lcx/RCA. Normal EF.   Marland Kitchen Dysrhythmia 2016   irregular heartbeat  . Myocardial infarction ([place])   . Seasonal allergies     Past Surgical History:  Procedure Laterality Date  . CORONARY ANGIOPLASTY  10/09/2016  . CORONARY STENT INTERVENTION N/A 10/09/2016   Procedure: CORONARY STENT INTERVENTION;  Surgeon: Nelva Bush, MD;  Location: [facility];  Service: Cardiovascular;  Laterality: N/A;  . INTRAVASCULAR ULTRASOUND/IVUS N/A 10/09/2016   Procedure:  Intravascular Ultrasound/IVUS;  Surgeon: Nelva Bush, MD;  Location: [facility];  Service: Cardiovascular;  Laterality: N/A;  . LEFT HEART CATH AND CORONARY ANGIOGRAPHY N/A 10/09/2016   Procedure: LEFT HEART CATH AND CORONARY ANGIOGRAPHY;  Surgeon: Nelva Bush, MD;  Location: [facility];  Service: Cardiovascular;  Laterality: N/A;    Social History   Socioeconomic History  . Marital status: Divorced    Spouse name: Not on file  . Number of children: Not on file  . Years of education: Not on file  . Highest education level: Not on file  Occupational History  . Not on file  Social Needs  . Financial resource strain: Not on file  . Food insecurity    Worry: Not on file    Inability: Not on file  . Transportation needs    Medical: Not on file    Non-medical: Not on file  Tobacco Use  . Smoking status: Current Every Day Smoker    Packs/day: 2.00    Years: 45.00    Pack years: 90.00    Types: Cigarettes    Start date: 01/09/1961  . Smokeless tobacco: Never Used  . Tobacco comment: thinking about after settling in   Substance and Sexual Activity  . Alcohol use: No  . Drug use: No  . Sexual activity: Not on file  Lifestyle  . Physical activity    Days per week: Not on file    Minutes per session: Not on  file  . Stress: Not on file  Relationships  . Social Herbalist on phone: Not on file    Gets together: Not on file    Attends religious service: Not on file    Active member of club or organization: Not on file    Attends meetings of clubs or organizations: Not on file    Relationship status: Not on file  . Intimate partner violence    Fear of current or ex partner: Not on file    Emotionally abused: Not on file    Physically abused: Not on file    Forced sexual activity: Not on file  Other Topics Concern  . Not on file  Social History Narrative  . Not on file    No Known Allergies  Family History  Problem Relation Age of Onset   . Cancer Father     Prior to Admission medications   Medication Sig Start Date End Date Taking? Authorizing Provider  aspirin 81 MG EC tablet Take 1 tablet (81 mg total) by mouth daily. Patient not taking: Reported on 11/13/2018 10/10/16   Cheryln Manly, NP  meclizine (ANTIVERT) 25 MG tablet Take 1 tablet (25 mg total) by mouth 3 (three) times daily as needed for dizziness. 05/10/17   [name], DO  meloxicam (MOBIC) 15 MG tablet TAKE 1 TABLET(15 MG) BY MOUTH DAILY. DO NOT TAKE WITH ALEVE OR IBUPROFEN Patient taking differently: Take 15 mg by mouth daily.  06/21/17   [name], DO  methocarbamol (ROBAXIN) 500 MG tablet Take 1 tablet (500 mg total) by mouth 2 (two) times daily. Patient taking differently: Take 500 mg by mouth 2 (two) times daily as needed for muscle spasms.  09/17/18   Lacretia Leigh, MD  metoprolol tartrate (LOPRESSOR) 25 MG tablet Take 0.5 tablets (12.5 mg total) by mouth 2 (two) times daily. Patient not taking: Reported on 09/17/2018 01/30/17   [name], MD  naproxen sodium (ALEVE) 220 MG tablet Take 220 mg by mouth daily as needed (pain).    [provider]  nitroGLYCERIN (NITROSTAT) 0.4 MG SL tablet Place 1 tablet (0.4 mg total) under the tongue every 5 (five) minutes x 3 doses as needed for chest pain. 01/24/17   Geradine Girt, DO  tamsulosin (FLOMAX) 0.4 MG CAPS capsule Take 2 capsules (0.8 mg total) by mouth daily. 05/02/18   [name], DO  ticagrelor (BRILINTA) 90 MG TABS tablet Take 1 tablet (90 mg total) by mouth 2 (two) times daily. Patient not taking: Reported on 09/17/2018 10/10/16   Cheryln Manly, NP  traMADol (ULTRAM) 50 MG tablet Take 1 tablet (50 mg total) by mouth every 12 (twelve) hours as needed for up to 5 days. 11/14/18 11/19/18  [name], DO  traZODone (DESYREL) 100 MG tablet Take 0.5-1 tablets (50-100 mg total) by mouth at bedtime as needed for sleep. 06/21/17   [name], DO    Physical Exam: Vitals:    11/15/18 [id number] 11/15/18 0632 11/15/18 0940 11/15/18 1036  BP:    137/76  Pulse: 100 (!) 106  95  Resp: 19 19  16   Temp:    98.2 F (36.8 C)  TempSrc:    Oral  SpO2: 91% (!) 89%  91%  Weight:   95.2 kg   Height:   6\' 1"  (1.854 m)      . General:  Appears calm and comfortable and is NAD, mildly ill-appearing . Eyes:  PERRL, EOMI, normal  lids, iris . ENT:  grossly normal hearing, lips & tongue, mildly dry mm; edentulous . Neck:  no LAD, masses or thyromegaly . Cardiovascular:  RR with mild tachycardia, no m/r/g. No LE edema.  Marland Kitchen Respiratory:   CTA bilaterally with no wheezes/rales/rhonchi.  Normal respiratory effort. . Abdomen:  soft, R-sided abdominal TTP, ND, NABS . Back:   normal alignment, no CVAT . Skin:  no rash or induration seen on limited exam . Musculoskeletal:  grossly normal tone BUE/BLE, good ROM, no bony abnormality . Psychiatric: blunted mood and affect, speech fluent and appropriate, AOx3 . Neurologic:  CN 2-12 grossly intact, moves all extremities in coordinated fashion, sensation intact    Radiological Exams on Admission: Dg Chest 2 View  Result Date: 11/15/2018 CLINICAL DATA:  Hypoxia EXAM: CHEST - 2 VIEW COMPARISON:  06/12/2017 FINDINGS: Heart size and vascularity normal. Mild right lower lobe airspace disease is new since the prior study. Left lung is clear. No effusion or edema. Mild hyperinflation of the lungs. IMPRESSION: Mild right lower lobe airspace disease. Possible pneumonia versus atelectasis. Electronically Signed   By: Franchot Gallo M.D.   On: 11/15/2018 07:57   Ct Angio Chest Pe W And/or Wo Contrast  Result Date: 11/15/2018 CLINICAL DATA:  Elevated D-dimer. Hypoxia. Current smoker. Abnormal chest radiograph. EXAM: CT ANGIOGRAPHY CHEST WITH CONTRAST TECHNIQUE: Multidetector CT imaging of the chest was performed using the standard protocol during bolus administration of intravenous contrast. Multiplanar CT image reconstructions and MIPs were obtained to  evaluate the vascular anatomy. CONTRAST:  163mL OMNIPAQUE IOHEXOL 350 MG/ML SOLN COMPARISON:  Chest radiograph from earlier today. FINDINGS: Cardiovascular: The study is high quality for the evaluation of pulmonary embolism. There are no filling defects in the central, lobar, segmental or subsegmental pulmonary artery branches to suggest acute pulmonary embolism. Great vessels are normal in course and caliber. Normal heart size. No significant pericardial fluid/thickening. Three-vessel coronary atherosclerosis. Mediastinum/Nodes: Prominent goiter predominantly enlarged on the left with substernal extension on the left with right tracheal deviation and slight tracheal narrowing, without discrete thyroid nodule on CT. Nonspecific circumferential wall thickening in the lower thoracic esophagus. Fluid level in the midthoracic esophagus. No pathologically enlarged axillary, mediastinal or hilar lymph nodes. Lungs/Pleura: No pneumothorax. No pleural effusion. Severe centrilobular and paraseptal emphysema with mild diffuse bronchial wall thickening. No acute consolidative airspace disease or lung masses. Mild platelike scarring versus atelectasis in right lower lobe. Several scattered solid pulmonary nodules in both lungs, largest 7 mm in the lingula (series 6/image 101). Upper abdomen: Mild gallbladder wall thickening and pericholecystic fat stranding. Tiny granulomatous splenic calcification. Musculoskeletal: No aggressive appearing focal osseous lesions. Mild thoracic spondylosis. Review of the MIP images confirms the above findings. IMPRESSION: 1. No pulmonary embolism. 2. Mild gallbladder wall thickening and pericholecystic fat stranding in the visualized upper abdomen, compatible with reported acute cholecystitis on abdominal sonogram study from earlier today. 3. Scattered solid pulmonary nodules, largest 7 mm in the lingula. Non-contrast chest CT at 3-6 months is recommended. If the nodules are stable at time of  repeat CT, then future CT at 18-24 months (from today's scan) is considered optional for low-risk patients, but is recommended for high-risk patients. This recommendation follows the consensus statement: Guidelines for Management of Incidental Pulmonary Nodules Detected on CT Images: From the Fleischner Society 2017; Radiology 2017; 284:228-243. 4. Nonspecific circumferential wall thickening in the lower thoracic esophagus. While most commonly due to reflux esophagitis, Barrett's esophagus or esophageal neoplasm cannot be excluded by CT. GI consultation  suggested. 5. Prominent goiter, asymmetric to the left. Thyroid ultrasound evaluation suggested on a short term outpatient basis. Aortic Atherosclerosis (ICD10-I70.0) and Emphysema (ICD10-J43.9). Electronically Signed   By: Ilona Sorrel M.D.   On: 11/15/2018 12:29   US Abdomen Limited Ruq  Result Date: 11/15/2018 CLINICAL DATA:  Right upper quadrant pain EXAM: ULTRASOUND ABDOMEN LIMITED RIGHT UPPER QUADRANT COMPARISON:  November 12, 2018 FINDINGS: Gallbladder: Within the gallbladder, there is extensive sludge with intermingled tiny gallstones throughout the sludge. Currently, the gallbladder wall is thickened, and there is pericholecystic fluid. The patient is focally tender over the gallbladder. Common bile duct: Diameter: 2 mm. No intrahepatic or extrahepatic biliary duct dilatation. Liver: No focal lesion identified. Within normal limits in parenchymal echogenicity. Portal vein is patent on color Doppler imaging with normal direction of blood flow towards the liver. Other: None. IMPRESSION: Extensive sludge throughout the gallbladder with tiny intermingled gallstones. There is gallbladder wall thickening and pericholecystic fluid. Patient is focally tender over the gallbladder. These are findings indicative of acute cholecystitis. Study otherwise unremarkable. Electronically Signed   By: Lowella Grip III M.D.   On: 11/15/2018 08:10    EKG: Independently  reviewed.  NSR with rate 89; nonspecific ST changes with no evidence of acute ischemia   Labs on Admission: I have personally reviewed the available labs and imaging studies at the time of the admission.  Pertinent labs:   Na++ 132 Glucose 141 Albumin 3.4 AST 57/ALT 67/Bili 2.5 WBC 19.5 Hgb 17.5 Platelets 132 D-dimer 1.86 APAP <10   Assessment/Plan Principal Problem:   Cholecystitis Active Problems:   Benign prostatic hyperplasia with urinary frequency   CAD (coronary artery disease)   Cigarette nicotine dependence without complication   Goiter   Pulmonary nodules/lesions, multiple   Esophageal thickening   Cholecystitis -Patient with repeated recent ER visits for worsening abdominal pain -09/17/18 CT without evidence of GB disease -11/3 [place] with sludge but no acute cholecystitis -11/4 CT with nonobstructing nephrolithiasis; lung nodules; normal-appearing GB -US today with extensive sludge, tiny gallstones, wall thickening and pericholecystic fluid suggestive of cholecystitis -CT today with mild GB wall thickening and pericholecystic fat stranding in the upper abdomen c/w acute cholecystitis; pulmonary nodules; lower esophageal thickening; and goiter -Will admit to med surg -Morphine for pain, Zofran for nausea -Surgery has seen and recommends perc drain for now and chole in 4-6 weeks -Empiric coverage with Zosyn for now  Pulmonary nodules -Patient with hypoxia on arrival without respiratory complaints -CXR with RLL airspace disease -This was not clinically consistent with presentation -Elevated D-dimer and CTA ordered -CTA shows severe COPD, which was suspected given patient's 2ppd smoking history -No acute infection appreciated on CT -He does have several nodules and needs repeat chest CT in 3-6 months  Esophageal thickening -This was also seen on CT -Given current gallbladder disease issue, GI consultation was not requested -Will repeat LFTs post-perc drain  -Consider GI f/u inpatient vs. Outpatient, as he is likely to need EGD  Goiter -Also needs close outpatient f/u for this issue - but unlikely related to presenting complaint.  BPH -Continue Flomax  CAD -Previously on ASA and Brilinta but stopped taking both -He would benefit from outpatient cardiology f/u -Likely needs clearance prior to surgery in 4-6 weeks  Heavy tobacco dependence -Encourage cessation.   -This was discussed with the patient and should be reviewed on an ongoing basis.   -Patch ordered at patient request.     Note: This patient has been tested and is negative  for the novel coronavirus COVID-19.  DVT prophylaxis:  Lovenox  Code Status:  Full - confirmed with patient Family Communication: None present; I called and left a message for his sister-in-law  Disposition Plan:  Home once clinically improved Consults called: Surgery; IR  Admission status: Admit - It is my clinical opinion that admission to INPATIENT is reasonable and necessary because of the expectation that this patient will require hospital care that crosses at least 2 midnights to treat this condition based on the medical complexity of the problems presented.  Given the aforementioned information, the predictability of an adverse outcome is felt to be significant.    Karmen Bongo MD [facility]   How to contact the [facility] Attending or Consulting provider [place] or covering provider during after hours [place], for this patient?  1. Check the care team in [facility] and look for a) attending/consulting [facility] provider listed and b) the [facility] team listed 2. Log into [email or web address] and use [facility]'s universal password to access. If you do not have the password, please contact the hospital operator. 3. Locate the [facility] provider you are looking for under [facility] and page to a number that you can be directly reached. 4. If you still have difficulty reaching the provider, please page the [facility]  (Director on Call) for the Hospitalists listed on amion for assistance.   11/15/2018, 1:52 PM

## 2018-11-15 NOTE — Consult Note (Signed)
David Hendricks [birth year]  [id number].    Requesting MD: Dr. Wyvonnia Dusky  Chief Complaint: RUQ abdominal pain, N/V  Reason for Consult: Acute Cholecystitis   HPI: David Hendricks is a a 68 y.o. male with a history of tobacco abuse (2 PPD-90 pack year hx) and CAD (hx of MI s/p stenting - appears to be lost to f/u and has not been compliant with Brillinta) who presented to [facility] with right upper quadrant abdominal pain, nausea and vomiting.  Patient reports that his pain initially started around midnight on 11/3 as a sudden onset of right upper quadrant abdominal pain.  He reports that the pain radiated to his back and scapula.  He had associated nausea as well as vomiting.  He presented to the emergency department for work-up on 11/3 where he underwent lab work that was significant for WBC of 15.4, normal LFTs, normal lipase as well as a right a quadrant ultrasound that showed a sludge filled gallbladder without evidence of acute cholecystitis.  Patient's pain improved after IV morphine and he was discharged home.  He reports after arriving home his right upper quadrant abdominal pain, nausea and emesis returned.  He returned to the emergency department on 11/4 where he underwent lab work that was significant for WBC 14.8, and T bili of 1.3.  His AST, ALT, alk phos and lipase within normal limits.  He underwent a CT of the abdomen pelvis with contrast that showed no intra-abdominal cause for his pain.  Specifically his gallbladder wall was not thickened on CT.  He was discharged home with instruction to follow-up with PCP.  He was seen by his PCP for a video visit yesterday who recommended if his symptoms worsened he should return to the ED.  He presented to the ED this morning via EMS for continued constant right upper quadrant abdominal pain that radiated to his back with associated nausea and emesis.  He reports his last episode of emesis was around 5 AM this morning.  No associated fever or chills.  He did  have some shortness of breath and was noted to be hypoxic in the 80s on arrival.  He is currently being worked up for pneumonia versus PE and has a CTA of the chest ordered by EDP.  Work-up in the ED was significant for WBC of 19.5, AST 57, ALT 67, alk phos 115, T bili 2.5.  He underwent a right upper quadrant ultrasound that showed extensive sludge throughout the gallbladder with tiny gallstones, along with gallbladder wall thickening and pericholecystic fluid consistent with acute cholecystitis.  General surgery was asked to see. No prior abdominal surgeries. He does take a baby aspirin daily.   ROS: Review of Systems  Constitutional: Negative for chills and fever.  Respiratory: Positive for shortness of breath. Negative for wheezing.   Cardiovascular: Negative for chest pain and leg swelling.  Gastrointestinal: Positive for abdominal pain, diarrhea, nausea and vomiting. Negative for constipation.  Genitourinary: Negative for dysuria.  Musculoskeletal: Positive for back pain. Negative for myalgias.  Skin: Negative for rash.  All other systems reviewed and are negative.   Family History  Problem Relation Age of Onset   Cancer Father     Past Medical History:  Diagnosis Date   Anxiety    Arthritis    Coronary artery disease    10/18 PCI/DES to pLAD, mild nonobstructive disease in the Lcx/RCA. Normal EF.    Dysrhythmia 2016   irregular heartbeat   Myocardial infarction ([place])  Seasonal allergies     Past Surgical History:  Procedure Laterality Date   CORONARY ANGIOPLASTY  10/09/2016   CORONARY STENT INTERVENTION N/A 10/09/2016   Procedure: CORONARY STENT INTERVENTION;  Surgeon: Nelva Bush, MD;  Location: [place] [facility];  Service: Cardiovascular;  Laterality: N/A;   INTRAVASCULAR ULTRASOUND/IVUS N/A 10/09/2016   Procedure: Intravascular Ultrasound/IVUS;  Surgeon: Nelva Bush, MD;  Location: [place] [facility];  Service: Cardiovascular;  Laterality: N/A;    LEFT HEART CATH AND CORONARY ANGIOGRAPHY N/A 10/09/2016   Procedure: LEFT HEART CATH AND CORONARY ANGIOGRAPHY;  Surgeon: Nelva Bush, MD;  Location: [place] [facility];  Service: Cardiovascular;  Laterality: N/A;    Social History:  reports that he has been smoking cigarettes. He started smoking about 57 years ago. He has a 90.00 pack-year smoking history. He has never used smokeless tobacco. He reports that he does not drink alcohol or use drugs.  No alcohol use  No illicit drug use Lives at home with his dog   Allergies: No Known Allergies  (Not in a hospital admission)  Physical Exam: Blood pressure (!) 143/84, pulse (!) 106, temperature 98.1 F (36.7 C), resp. rate 19, height _0  (1.854 m), weight 95.2 kg, SpO2 (!) 89 %. General: uncomfortable appearing, WN white male  HEENT: head is normocephalic, atraumatic.  Sclera are noninjected. No icterus. PERRL.  Ears and nose without any masses or lesions.  Mask over mouth Heart: Mild tachycardia with regular rhythm.  Palpable radial and pedal pulses bilaterally Lungs: Distant breath sounds throughout without any obvious wheezes, rhonchi, or rales noted.  Respiratory effort nonlabored Abd: Mildly distended with tenderness of the RUQ and a + Murphy's sign. GB not palpable. No masses or hernias. No prior abdominal scars.  MS: all 4 extremities are symmetrical with no cyanosis, clubbing, or edema. Skin: warm and dry with no masses, lesions, or rashes Psych: A&Ox3 with an appropriate affect.   Results for orders placed or performed during the hospital encounter of 11/15/18 (from the past 48 hour(s))  Lipase, blood     Status: None   Collection Time: 11/15/18  5:55 AM  Result Value Ref Range   Lipase 23 11 - 51 U/L    Comment: Performed at [facility], [place] [address] [place], [place] [address]  Comprehensive metabolic panel     Status: Abnormal   Collection Time: 11/15/18  5:55 AM  Result Value Ref Range   Sodium 132 (L) 135  - 145 mmol/L   Potassium 3.6 3.5 - 5.1 mmol/L   Chloride 94 (L) 98 - 111 mmol/L   CO2 27 22 - 32 mmol/L   Glucose, Bld 141 (H) 70 - 99 mg/dL   BUN 12 8 - 23 mg/dL   Creatinine, Ser 0.97 0.61 - 1.24 mg/dL   Calcium 9.2 8.9 - 10.3 mg/dL   Total Protein 6.6 6.5 - 8.1 g/dL   Albumin 3.4 (L) 3.5 - 5.0 g/dL   AST 57 (H) 15 - 41 U/L   ALT 67 (H) 0 - 44 U/L   Alkaline Phosphatase 115 38 - 126 U/L   Total Bilirubin 2.5 (H) 0.3 - 1.2 mg/dL   GFR calc non Af Amer >60 >60 mL/min   GFR calc Af Amer >60 >60 mL/min   Anion gap 11 5 - 15    Comment: Performed at [facility], [place] [address], [place], [place] [address]  CBC     Status: Abnormal   Collection Time:  11/15/18  5:55 AM  Result Value Ref Range   WBC 19.5 (H) 4.0 - 10.5 K/uL   RBC 6.14 (H) 4.22 - 5.81 MIL/uL   Hemoglobin 17.5 (H) 13.0 - 17.0 g/dL   HCT 51.3 39.0 - 52.0 %   MCV 83.6 80.0 - 100.0 fL   MCH 28.5 26.0 - 34.0 pg   MCHC 34.1 30.0 - 36.0 g/dL   RDW 12.5 11.5 - 15.5 %   Platelets 132 (L) 150 - 400 K/uL    Comment: REPEATED TO VERIFY   nRBC 0.0 0.0 - 0.2 %    Comment: Performed at [facility], [place] [address] [place], [place] [address]  Ethanol     Status: None   Collection Time: 11/15/18  5:55 AM  Result Value Ref Range   Alcohol, Ethyl (B) <10 <10 mg/dL    Comment: (NOTE) Lowest detectable limit for serum alcohol is 10 mg/dL. For medical purposes only. Performed at [facility], [place] [address] [place], [place] [address]   Acetaminophen Level     Status: Abnormal   Collection Time: 11/15/18  5:55 AM  Result Value Ref Range   Acetaminophen (Tylenol), Serum <10 (L) 10 - 30 ug/mL    Comment: (NOTE) Therapeutic concentrations vary significantly. A range of 10-30 ug/mL  may be an effective concentration for many patients. However, some  are best treated at concentrations outside of this range. Acetaminophen concentrations >150 ug/mL at 4 hours after ingestion  and >50 ug/mL at 12 hours after  ingestion are often associated with  toxic reactions. Performed at [facility], [place] [address], [place], [place] [address]   D-dimer, quantitative (not at [facility])     Status: Abnormal   Collection Time: 11/15/18  5:55 AM  Result Value Ref Range   D-Dimer, Quant 1.86 (H) 0.00 - 0.50 ug/mL-FEU    Comment: (NOTE) At the manufacturer cut-off of 0.50 ug/mL FEU, this assay has been documented to exclude PE with a sensitivity and negative predictive value of 97 to 99%.  At this time, this assay has not been approved by the FDA to exclude DVT/VTE. Results should be correlated with clinical presentation. Performed at [facility], [place] [address], [place], [place] [address]    Dg Chest 2 View  Result Date: 11/15/2018 CLINICAL DATA:  Hypoxia EXAM: CHEST - 2 VIEW COMPARISON:  06/12/2017 FINDINGS: Heart size and vascularity normal. Mild right lower lobe airspace disease is new since the prior study. Left lung is clear. No effusion or edema. Mild hyperinflation of the lungs. IMPRESSION: Mild right lower lobe airspace disease. Possible pneumonia versus atelectasis. Electronically Signed   By: Franchot Gallo M.D.   On: 11/15/2018 07:57   Ct Abdomen Pelvis W Contrast  Result Date: 11/13/2018 CLINICAL DATA:  Abdominal pain, primarily left lower quadrant region EXAM: CT ABDOMEN AND PELVIS WITH CONTRAST TECHNIQUE: Multidetector CT imaging of the abdomen and pelvis was performed using the standard protocol following bolus administration of intravenous contrast. CONTRAST:  139m OMNIPAQUE IOHEXOL 300 MG/ML  SOLN COMPARISON:  September 17, 2018, June 12, 2017, and November 29, 2015 FINDINGS: Lower chest: There is right base atelectatic change. There are small bullae in the inferior lingula. On axial slice 1 series 6, there is a 5 x 3 mm nodular opacity in the lateral segment of the right middle lobe. On axial slice six series 6, there is a stable 2 mm nodular opacity in the lateral segment right middle  lobe, unchanged. There is a 2 mm nodular opacity also seen in the lateral segment right middle lobe on axial slice 18 series 6. No lung base edema or consolidation on either side. There is a small hiatal hernia. Hepatobiliary: No focal liver lesions are evident. The gallbladder wall is not appreciably thickened. There is no biliary duct dilatation. Pancreas: There is no pancreatic mass or inflammatory focus. Spleen: No splenic lesions are evident beyond a small calcified splenic granuloma anteriorly and medially. Adrenals/Urinary Tract: Adrenals bilaterally appear unremarkable. There is a 5 mm apparent cyst in the mid right kidney as well as a 3 mm probable cyst in the posterior mid left kidney. There is no evident hydronephrosis on either side. There is a calculus in the upper pole of the left kidney measuring 7 x 6 mm. There is a 1 mm calculus in the mid left kidney. There is a 1 mm calculus in the upper pole left kidney. A 1 mm calculus is also noted in the lower pole of the left kidney. No calculi are demonstrated in the right kidney. There is no evident ureteral calculus on either side. Urinary bladder is mildly distended. The urinary bladder wall thickness is normal. Stomach/Bowel: There is no evident diverticulitis. There is no appreciable bowel wall or mesenteric thickening. There is no evident bowel obstruction. The terminal ileum appears normal. There is no free air or portal venous air. Vascular/Lymphatic: There is no abdominal aortic aneurysm. There is aortic and iliac artery atherosclerosis. No adenopathy is evident in the abdomen or pelvis. Reproductive: There are prostatic calculi. Prostate and seminal vesicles are normal in size and contour. No evident pelvic mass. Other: Appendix appears normal. No abscess or ascites is evident in the abdomen or pelvis. Musculoskeletal: There is degenerative change in the lumbar spine. There is vacuum phenomenon at L5-S1. There are no blastic or lytic bone lesions.  There are a few scattered apparent bone islands in the pelvis and proximal femur regions, stable. There is no intramuscular or abdominal wall lesion. IMPRESSION: 1. Nonobstructing calculi in the left kidney, largest in the upper pole left kidney region measuring 7 x 6 mm. No hydronephrosis or ureteral calculus on either side. Urinary bladder wall thickness normal. There are prostatic calculi. 2. No diverticulitis evident. No bowel obstruction. No abscess in the abdomen or pelvis. Appendix appears normal. 3. Small nodular opacities in the right middle lobe, largest measuring 5 x 4 mm. A 5 x 4 mm nodular opacity is more superior in location in the other nodular opacities and has not been visualized on previous studies due to different lung base starting points. No follow-up needed if patient is low-risk (and has no known or suspected primary neoplasm). Non-contrast chest CT can be considered in 12 months if patient is high-risk. This recommendation follows the consensus statement: Guidelines for Management of Incidental Pulmonary Nodules Detected on CT Images: From the Fleischner Society 2017; Radiology 2017; 284:228-243. 4.  Aortic Atherosclerosis (ICD10-I70.0). Electronically Signed   By: Lowella Grip III M.D.   On: 11/13/2018 10:58   US Abdomen Limited Ruq  Result Date: 11/15/2018 CLINICAL DATA:  Right upper quadrant pain EXAM: ULTRASOUND ABDOMEN LIMITED RIGHT UPPER QUADRANT COMPARISON:  November 12, 2018 FINDINGS: Gallbladder: Within the gallbladder, there is extensive sludge with intermingled tiny gallstones throughout the sludge. Currently, the gallbladder wall is thickened, and there is pericholecystic fluid. The patient is focally tender over the gallbladder. Common bile duct: Diameter: 2 mm. No intrahepatic or extrahepatic biliary duct dilatation. Liver:  No focal lesion identified. Within normal limits in parenchymal echogenicity. Portal vein is patent on color Doppler imaging with normal direction of  blood flow towards the liver. Other: None. IMPRESSION: Extensive sludge throughout the gallbladder with tiny intermingled gallstones. There is gallbladder wall thickening and pericholecystic fluid. Patient is focally tender over the gallbladder. These are findings indicative of acute cholecystitis. Study otherwise unremarkable. Electronically Signed   By: Lowella Grip III M.D.   On: 11/15/2018 08:10    Anti-infectives (From admission, onward)   Start     Dose/Rate Route Frequency Ordered Stop   11/15/18 1100  piperacillin-tazobactam (ZOSYN) IVPB 3.375 g     3.375 g 12.5 mL/hr over 240 Minutes Intravenous Every 8 hours 11/15/18 1022     11/15/18 0845  cefTRIAXone (ROCEPHIN) 2 g in sodium chloride 0.9 % 100 mL IVPB     2 g 200 mL/hr over 30 Minutes Intravenous  Once 11/15/18 0836 11/15/18 0934      Assessment/Plan CAD, Hx of MI s/p stent - previously on Brilinta (reports he is no longer taking) Hypoxia - ? RLL PNA on Xray. CTA ordered by EDP to rule out PE Hyperglycemia  Hyponatremia  - Above per TRH -   Acute Cholecystitis - Given patients cardiac hx and new hypoxia, recommend Perc Chole Drain by IR. I have spoken with IR who will try to fit him in for procedure today - Start IV Zosyn - Bowel rest - Agree w/ hospitalist admission  FEN - NPO VTE - SCDs ID - Rocephin x 1 in ED. Zosyn 11/6 >>  Jillyn Ledger, [facility] 11/15/2018, 10:23 AM Please see Amion for pager number during day hours 7:00am-4:30pm

## 2018-11-15 NOTE — Progress Notes (Signed)
IR requested by Dr. Lorin Mercy for possible image-guided percutaneous cholecystostomy tube placement.  Procedure has been approved by Dr. Kathlene Cote. Patient has been seen/consented for procedure. Procedure was tentatively scheduled for today. Unfortunately, unable to accomodate procedure today due to IR schedule. Plan for image-guided percutaneous cholecystostomy tube placement in IR tentatively for tomorrow 11/16/2018. Patient to be NPO at midnight. Hold all thinners at midnight. Legrand Como, PA-C ([facility]) aware. Judson Roch, RN aware.  Please call IR with questions/concerns.   Bea Graff Payton Prinsen, PA-C 11/15/2018, 3:55 PM

## 2018-11-15 NOTE — ED Notes (Signed)
Pt noted to be 87% on room air on arrival to room 50. Placed on 3L nasal cannula with improvement to 93%.

## 2018-11-15 NOTE — ED Triage Notes (Signed)
Pt arrives via EMS for intermittent Abdominal pain (right side and epigastric), associated with nausea.  NSR 94, 140/84, 100% RA, cbg 176 16R. 324 ASA given, 4 zofran. 18 g right hand. Pt has been seen recently for the same

## 2018-11-15 NOTE — Progress Notes (Signed)
Transferred patient back to ED purple 50

## 2018-11-15 NOTE — ED Provider Notes (Signed)
Care assumed by Dr. Leonette Monarch.  Patient returns with right upper quadrant abdominal pain.  Several recent visits for same.  Had gallbladder ultrasound that showed sludge.  Patient with rising leukocytosis.  Repeat ultrasound will be pending.  We will also obtain acetaminophen level and ethanol level.  Does have some borderline O2 saturation values.  He denies any fever or cough.  Chest x-ray shows suspected right lower lobe airspace disease. D-dimer added on.   Repeat ultrasound shows extensive gallbladder sludge and multiple gallstones with concern for acute cholecystitis. D-dimer is positive and there is abnormality of right basilar lung so we will proceed with CT angiogram chest as well. APAP and ethanol nondetectable.  IV antibiotic started and will consult general surgery.  D/w Orlinda Blalock for general surgery.  They will evaluate patient and recommend medical admission.  Admission discussed with Dr. Lorin Mercy of hospitalist service.  CT PE study pending.  Patient received antibiotics for cholecystitis.    Ezequiel Essex, MD 11/15/18 [phone]

## 2018-11-16 ENCOUNTER — Encounter (HOSPITAL_COMMUNITY): Payer: Self-pay | Admitting: Interventional Radiology

## 2018-11-16 ENCOUNTER — Inpatient Hospital Stay (HOSPITAL_COMMUNITY): Payer: Medicare Other

## 2018-11-16 HISTORY — PX: IR PERC CHOLECYSTOSTOMY: IMG2326

## 2018-11-16 LAB — CBC
HCT: 46.9 % (ref 39.0–52.0)
Hemoglobin: 16.1 g/dL (ref 13.0–17.0)
MCH: 28.8 pg (ref 26.0–34.0)
MCHC: 34.3 g/dL (ref 30.0–36.0)
MCV: 83.9 fL (ref 80.0–100.0)
Platelets: 135 10*3/uL — ABNORMAL LOW (ref 150–400)
RBC: 5.59 MIL/uL (ref 4.22–5.81)
RDW: 12.7 % (ref 11.5–15.5)
WBC: 21 10*3/uL — ABNORMAL HIGH (ref 4.0–10.5)
nRBC: 0 % (ref 0.0–0.2)

## 2018-11-16 LAB — COMPREHENSIVE METABOLIC PANEL
ALT: 111 U/L — ABNORMAL HIGH (ref 0–44)
AST: 96 U/L — ABNORMAL HIGH (ref 15–41)
Albumin: 2.8 g/dL — ABNORMAL LOW (ref 3.5–5.0)
Alkaline Phosphatase: 150 U/L — ABNORMAL HIGH (ref 38–126)
Anion gap: 11 (ref 5–15)
BUN: 11 mg/dL (ref 8–23)
CO2: 26 mmol/L (ref 22–32)
Calcium: 9 mg/dL (ref 8.9–10.3)
Chloride: 96 mmol/L — ABNORMAL LOW (ref 98–111)
Creatinine, Ser: 0.97 mg/dL (ref 0.61–1.24)
GFR calc Af Amer: >60 mL/min (ref >60)
GFR calc non Af Amer: >60 mL/min (ref >60)
Glucose, Bld: 108 mg/dL — ABNORMAL HIGH (ref 70–99)
Potassium: 4.1 mmol/L (ref 3.5–5.1)
Sodium: 133 mmol/L — ABNORMAL LOW (ref 135–145)
Total Bilirubin: 3.6 mg/dL — ABNORMAL HIGH (ref 0.3–1.2)
Total Protein: 6.2 g/dL — ABNORMAL LOW (ref 6.5–8.1)

## 2018-11-16 MED ORDER — FENTANYL CITRATE (PF) 100 MCG/2ML IJ SOLN
INTRAMUSCULAR | Status: AC
  Filled 2018-11-16: qty 4

## 2018-11-16 MED ORDER — LIDOCAINE HCL 1 % IJ SOLN
INTRAMUSCULAR | Status: AC
  Filled 2018-11-16: qty 20

## 2018-11-16 MED ORDER — MIDAZOLAM HCL 2 MG/2ML IJ SOLN
INTRAMUSCULAR | Status: AC | PRN
  Administered 2018-11-16: 0.5 mg via INTRAVENOUS
  Administered 2018-11-16: 1 mg via INTRAVENOUS

## 2018-11-16 MED ORDER — MIDAZOLAM HCL 2 MG/2ML IJ SOLN
INTRAMUSCULAR | Status: AC
  Filled 2018-11-16: qty 6

## 2018-11-16 MED ORDER — IPRATROPIUM-ALBUTEROL 0.5-2.5 (3) MG/3ML IN SOLN
3.0000 mL | RESPIRATORY_TRACT | Status: DC | PRN
  Administered 2018-11-17: 3 mL via RESPIRATORY_TRACT

## 2018-11-16 MED ORDER — SODIUM CHLORIDE 0.9 % IV SOLN
3.0000 g | Freq: Four times a day (QID) | INTRAVENOUS | Status: DC
  Administered 2018-11-16 – 2018-11-18 (×10): 3 g via INTRAVENOUS
  Filled 2018-11-16: qty 8
  Filled 2018-11-16 (×3): qty 3
  Filled 2018-11-16 (×4): qty 8
  Filled 2018-11-16: qty 3
  Filled 2018-11-16: qty 8
  Filled 2018-11-16: qty 3
  Filled 2018-11-16 (×3): qty 8

## 2018-11-16 MED ORDER — FENTANYL CITRATE (PF) 100 MCG/2ML IJ SOLN
INTRAMUSCULAR | Status: AC | PRN
  Administered 2018-11-16 (×2): 50 ug via INTRAVENOUS

## 2018-11-16 MED ORDER — SODIUM CHLORIDE 0.9% FLUSH
5.0000 mL | Freq: Three times a day (TID) | INTRAVENOUS | Status: DC
  Administered 2018-11-16 – 2018-11-17 (×3): 5 mL

## 2018-11-16 MED ORDER — GUAIFENESIN-DM 100-10 MG/5ML PO SYRP
5.0000 mL | ORAL_SOLUTION | ORAL | Status: DC | PRN
  Administered 2018-11-16: 5 mL via ORAL
  Filled 2018-11-16 (×2): qty 5

## 2018-11-16 MED ORDER — LIDOCAINE HCL (PF) 1 % IJ SOLN
INTRAMUSCULAR | Status: AC | PRN
  Administered 2018-11-16: 10 mL

## 2018-11-16 MED ORDER — BUDESONIDE 0.5 MG/2ML IN SUSP: 0.5000 mg | Freq: Two times a day (BID) | RESPIRATORY_TRACT | Status: DC

## 2018-11-16 MED ORDER — BUDESONIDE 0.5 MG/2ML IN SUSP
0.5000 mg | Freq: Two times a day (BID) | RESPIRATORY_TRACT | Status: DC
  Administered 2018-11-17 – 2018-11-18 (×4): 0.5 mg via RESPIRATORY_TRACT
  Filled 2018-11-16 (×5): qty 2

## 2018-11-16 MED ORDER — IOHEXOL 300 MG/ML  SOLN
50.0000 mL | Freq: Once | INTRAMUSCULAR | Status: AC | PRN
  Administered 2018-11-16: 10 mL

## 2018-11-16 MED ORDER — HYDROCODONE-ACETAMINOPHEN 5-325 MG PO TABS
1.0000 | ORAL_TABLET | ORAL | Status: DC | PRN
  Administered 2018-11-16: 2 via ORAL
  Filled 2018-11-16: qty 2

## 2018-11-16 NOTE — Progress Notes (Signed)
Patient ID: David Hendricks, male   DOB: 04-05-1950, 68 y.o.   MRN: [id number]  PROGRESS NOTE    David Hendricks  [id number] DOB: 1950/05/20 DOA: 11/15/2018   PCP: [name], DO    Brief Narrative: 68 year old male with history of coronary artery disease status post PCI, benign prostatic hypertrophy, remote history of alcohol abuse admitted for recurrent abdominal pain.  Patient was seen in the ER for right flank pain on September 17, 2018 and CT scan was unremarkable.  He was discharged and returned back to the ED on 11/12/2018 with right upper quadrant pain.  Ultrasound was negative for cholecystitis but CT scan of the abdomen and pelvis showed gallbladder sludge along with pulmonary nodules. On return to the ED yesterday, ultrasound of the right upper quadrant showed biliary sludge and CT of the abdomen showed nephrolithiasis, and lung nodules.  On presentation in the ED yesterday, patient was noted with hypoxia saturating at 88 to 89% on room air.  Chest x-ray was concerning for infiltrates, along with elevated liver function tests and white blood cell count. CT angiogram of the chest showed: IMPRESSION: 1. No pulmonary embolism. 2. Mild gallbladder wall thickening and pericholecystic fat stranding in the visualized upper abdomen, compatible with reported acute cholecystitis on abdominal sonogram study from earlier today. 3. Scattered solid pulmonary nodules, largest 7 mm in the lingula. Non-contrast chest CT at 3-6 months is recommended. If the nodules are stable at time of repeat CT, then future CT at 18-24 months (from today's scan) is considered optional for low-risk patients, but is recommended for high-risk patients. This recommendation follows the consensus statement: Guidelines for Management of Incidental Pulmonary Nodules Detected on CT Images: From the Fleischner Society 2017; Radiology 2017; 284:228-243. 4. Nonspecific circumferential wall thickening in the lower thoracic  esophagus. While most commonly due to reflux esophagitis, Barrett's esophagus or esophageal neoplasm cannot be excluded by CT. GI consultation suggested. 5. Prominent goiter, asymmetric to the left. Thyroid ultrasound evaluation suggested on a short term outpatient basis. Due to extensive cardiac and pulmonary pathology, general surgery is unable to perform any surgical intervention but consulted interventional radiology for percutaneous cholecystostomy. Patient is status post percutaneous cholecystostomy.   Assessment & Plan:   Principal Problem:   Cholecystitis Active Problems:   Benign prostatic hyperplasia with urinary frequency   CAD (coronary artery disease)   Cigarette nicotine dependence without complication   Goiter   Pulmonary nodules/lesions, multiple   Esophageal thickening  Clinical problems list 1.  Acute cholecystitis/biliary dyskinesia 2.  Acute hypoxic respiratory failure 3.  Coronary artery disease status post PCI 4.  Multiple pulmonary nodules 5.Benign prostatic hyperplasia without urinary frequency 6.  Esophageal thickening, concerning for Barrett's versus neoplasm 7.  Tobacco use disorder  1.  Acute cholecystitis/biliary dyskinesia.  CT angio consistent with gallbladder wall thickening with pericholecystic fat stranding, coupled with ultrasound finding of biliary sludge.  Patient is not a candidate for surgical intervention due to extensive cardiopulmonary pathology. He is status post percutaneous cholecystostomy by interventional radiology. Will switch to Unasyn 3 g every 6 hourly.  Consultation with infectious disease.   Gentle IV fluid hydration  2.  Acute hypoxic respiratory failure.  Etiology uncertain.  Chest x-ray showed right upper lobe airspace disease likely pneumonia versus atelectasis.  Patient has been to the hospital multiple times and concern for possible hospital-acquired pneumonia. Start Unasyn 3 g every 6 hourly  3.  Coronary artery  disease status post PCI with DES to pLAD. Normal EF Continue with  aspirin, statin and beta-blocker  4.  Multiple pulmonary nodules.  Findings on CT angiogram of multiple pulmonary nodules with the largest measuring 7 mm.  It is pertinent to note that chest x-ray showed right lower lobe airspace disease.  Concerns for possible aspiration pneumonia versus metastatic lesion from esophageal neoplasm.  Discussed with ID on-call and he recommended Unasyn   5.  Benign prostatic hyperplasia without urinary symptoms Continue with Flomax 0.4 mg daily  6.  Esophageal thickening, concerning for esophagitis versus Barrett's versus neoplasm. Will consult GI to further evaluate  7.  Tobacco use disorder Counseling on tobacco cessation-3 minutes Continue with nicotine patch     DVT prophylaxis: [place] subacute  Code Status: Full   Family Communication: None at bedside  Disposition Plan: Pending clinical improvement    Consultants:   General surgery  Interventional radiology  Procedures: Percutaneous cholecystostomy    Antimicrobials: Zosyn, then discontinued per consultation with ID  Unasyn 3 g every 6 hourly   Subjective: Patient is status post percutaneous cholecystostomy tube insertion by interventional radiology.  Drowsy but arousable.  On 3 L O2 by nasal cannula and saturating at 94%.  Not in acute distress.  Objective: Vitals:   11/16/18 1140 11/16/18 1145 11/16/18 1234 11/16/18 1412  BP: 135/89 (!) 151/81 133/69 120/66  Pulse: 93 92 99 91  Resp: (!) 21 15 (!) 23 18  Temp:   99.4 F (37.4 C) 99.7 F (37.6 C)  TempSrc:   Oral Oral  SpO2: 91% 93% 95%   Weight:      Height:        Intake/Output Summary (Last 24 hours) at 11/16/2018 1651 Last data filed at 11/16/2018 0500 Gross per 24 hour  Intake 1000 ml  Output 500 ml  Net 500 ml   Filed Weights   11/15/18 0940 11/15/18 2035  Weight: 95.2 kg 93.5 kg    Examination:  General exam: Appears calm and comfortable   Respiratory system: Diminished breath sounds on right mid and lower lung zone.  Respiratory effort normal.  On 3 L O2 by nasal cannula Cardiovascular system: S1 & S2 heard, RRR. No JVD, murmurs, rubs, gallops or clicks. No pedal edema. Gastrointestinal system: Dressing on right upper quadrant status post cholecystostomy.  JP drain in place.  Dressing clean and dry.  Abdomen is non distended. -Normal bowel sounds heard. Central nervous system: Alert and oriented. No focal neurological deficits. Extremities: Symmetric 5 x 5 power. Skin: No rashes, lesions or ulcers Psychiatry: Judgement and insight appear normal. Mood & affect appropriate.     Data Reviewed: I have personally reviewed following labs and imaging studies  CBC: Recent Labs  Lab 11/12/18 0240 11/13/18 0934 11/15/18 0555 11/16/18 0400  WBC 15.4* 14.8* 19.5* 21.0*  NEUTROABS 11.9* 10.9*  --   --   HGB 17.6* 17.2* 17.5* 16.1  HCT 53.2* 51.6 51.3 46.9  MCV 85.4 85.7 83.6 83.9  PLT 155 147* 132* [id number]*   Basic Metabolic Panel: Recent Labs  Lab 11/12/18 0240 11/13/18 0934 11/15/18 0555 11/16/18 0400  NA 140 135 132* 133*  K 3.9 3.7 3.6 4.1  CL 102 100 94* 96*  CO2 29 28 27 26   GLUCOSE 115* 114* 141* 108*  BUN 17 11 12 11   CREATININE 1.01 0.98 0.97 0.97  CALCIUM 9.2 8.9 9.2 9.0   GFR: Estimated Creatinine Clearance: 85.4 mL/min (by C-G formula based on SCr of 0.97 mg/dL). Liver Function Tests: Recent Labs  Lab 11/12/18 0240 11/13/18 0934 11/15/18  0555 11/16/18 0400  AST 19 17 57* 96*  ALT 17 16 67* 111*  ALKPHOS 100 100 115 150*  BILITOT 0.5 1.3* 2.5* 3.6*  PROT 7.0 7.0 6.6 6.2*  ALBUMIN 4.0 4.1 3.4* 2.8*   Recent Labs  Lab 11/12/18 0240 11/13/18 0934 11/15/18 0555  LIPASE 47 34 23   No results for input(s): AMMONIA in the last 168 hours. Coagulation Profile: Recent Labs  Lab 11/15/18 1026  INR 1.0   Cardiac Enzymes: No results for input(s): CKTOTAL, CKMB, CKMBINDEX, TROPONINI in the last  168 hours. BNP (last 3 results) No results for input(s): PROBNP in the last 8760 hours. HbA1C: No results for input(s): HGBA1C in the last 72 hours. CBG: No results for input(s): GLUCAP in the last 168 hours. Lipid Profile: No results for input(s): CHOL, HDL, LDLCALC, TRIG, CHOLHDL, LDLDIRECT in the last 72 hours. Thyroid Function Tests: No results for input(s): TSH, T4TOTAL, FREET4, T3FREE, THYROIDAB in the last 72 hours. Anemia Panel: No results for input(s): VITAMINB12, FOLATE, FERRITIN, TIBC, IRON, RETICCTPCT in the last 72 hours. Sepsis Labs: No results for input(s): PROCALCITON, LATICACIDVEN in the last 168 hours.  Recent Results (from the past 240 hour(s))  SARS CORONAVIRUS 2 (TAT 6-24 HRS) Nasopharyngeal Nasopharyngeal Swab     Status: None   Collection Time: 11/15/18  8:12 AM   Specimen: Nasopharyngeal Swab  Result Value Ref Range Status   SARS Coronavirus 2 NEGATIVE NEGATIVE Final    Comment: (NOTE) SARS-CoV-2 target nucleic acids are NOT DETECTED. The SARS-CoV-2 RNA is generally detectable in upper and lower respiratory specimens during the acute phase of infection. Negative results do not preclude SARS-CoV-2 infection, do not rule out co-infections with other pathogens, and should not be used as the sole basis for treatment or other patient management decisions. Negative results must be combined with clinical observations, patient history, and epidemiological information. The expected result is Negative. Fact Sheet for Patients: SugarRoll.be Fact Sheet for Healthcare Providers: https://www.woods-mathews.com/ This test is not yet approved or cleared by the [place] FDA and  has been authorized for detection and/or diagnosis of SARS-CoV-2 by FDA under an Emergency Use Authorization (EUA). This EUA will remain  in effect (meaning this test can be used) for the duration of the COVID-19 declaration under Section 56 4(b)(1)  of the Act, 21 U.S.C. section 360bbb-3(b)(1), unless the authorization is terminated or revoked sooner. Performed at [facility], [place] [address] [place], [place] [address]   Aerobic/Anaerobic Culture (surgical/deep wound)     Status: None (Preliminary result)   Collection Time: 11/16/18 12:02 PM   Specimen: PATH GI biopsy; Bile  Result Value Ref Range Status   Specimen Description GALL BLADDER  Final   Special Requests NONE  Final   Gram Stain   Final    NO WBC SEEN NO ORGANISMS SEEN Performed at [facility], [place] [address], [place], [place] [address]    Culture PENDING  Incomplete   Report Status PENDING  Incomplete         Radiology Studies: Dg Chest 2 View  Result Date: 11/15/2018 CLINICAL DATA:  Hypoxia EXAM: CHEST - 2 VIEW COMPARISON:  06/12/2017 FINDINGS: Heart size and vascularity normal. Mild right lower lobe airspace disease is new since the prior study. Left lung is clear. No effusion or edema. Mild hyperinflation of the lungs. IMPRESSION: Mild right lower lobe airspace disease. Possible pneumonia versus atelectasis. Electronically Signed   By: Franchot Gallo M.D.   On: 11/15/2018  07:57   Ct Angio Chest Pe W And/or Wo Contrast  Result Date: 11/15/2018 CLINICAL DATA:  Elevated D-dimer. Hypoxia. Current smoker. Abnormal chest radiograph. EXAM: CT ANGIOGRAPHY CHEST WITH CONTRAST TECHNIQUE: Multidetector CT imaging of the chest was performed using the standard protocol during bolus administration of intravenous contrast. Multiplanar CT image reconstructions and MIPs were obtained to evaluate the vascular anatomy. CONTRAST:  123mL OMNIPAQUE IOHEXOL 350 MG/ML SOLN COMPARISON:  Chest radiograph from earlier today. FINDINGS: Cardiovascular: The study is high quality for the evaluation of pulmonary embolism. There are no filling defects in the central, lobar, segmental or subsegmental pulmonary artery branches to suggest acute pulmonary embolism. Great vessels are  normal in course and caliber. Normal heart size. No significant pericardial fluid/thickening. Three-vessel coronary atherosclerosis. Mediastinum/Nodes: Prominent goiter predominantly enlarged on the left with substernal extension on the left with right tracheal deviation and slight tracheal narrowing, without discrete thyroid nodule on CT. Nonspecific circumferential wall thickening in the lower thoracic esophagus. Fluid level in the midthoracic esophagus. No pathologically enlarged axillary, mediastinal or hilar lymph nodes. Lungs/Pleura: No pneumothorax. No pleural effusion. Severe centrilobular and paraseptal emphysema with mild diffuse bronchial wall thickening. No acute consolidative airspace disease or lung masses. Mild platelike scarring versus atelectasis in right lower lobe. Several scattered solid pulmonary nodules in both lungs, largest 7 mm in the lingula (series 6/image 101). Upper abdomen: Mild gallbladder wall thickening and pericholecystic fat stranding. Tiny granulomatous splenic calcification. Musculoskeletal: No aggressive appearing focal osseous lesions. Mild thoracic spondylosis. Review of the MIP images confirms the above findings. IMPRESSION: 1. No pulmonary embolism. 2. Mild gallbladder wall thickening and pericholecystic fat stranding in the visualized upper abdomen, compatible with reported acute cholecystitis on abdominal sonogram study from earlier today. 3. Scattered solid pulmonary nodules, largest 7 mm in the lingula. Non-contrast chest CT at 3-6 months is recommended. If the nodules are stable at time of repeat CT, then future CT at 18-24 months (from today's scan) is considered optional for low-risk patients, but is recommended for high-risk patients. This recommendation follows the consensus statement: Guidelines for Management of Incidental Pulmonary Nodules Detected on CT Images: From the Fleischner Society 2017; Radiology 2017; 284:228-243. 4. Nonspecific circumferential wall  thickening in the lower thoracic esophagus. While most commonly due to reflux esophagitis, Barrett's esophagus or esophageal neoplasm cannot be excluded by CT. GI consultation suggested. 5. Prominent goiter, asymmetric to the left. Thyroid ultrasound evaluation suggested on a short term outpatient basis. Aortic Atherosclerosis (ICD10-I70.0) and Emphysema (ICD10-J43.9). Electronically Signed   By: Ilona Sorrel M.D.   On: 11/15/2018 12:29   Ir Perc Cholecystostomy  Result Date: 11/16/2018 CLINICAL DATA:  Acute cholecystitis. Poor surgical candidate. Percutaneous drainage requested. EXAM: PERCUTANEOUS CHOLECYSTOSTOMY TUBE PLACEMENT WITH ULTRASOUND AND FLUOROSCOPIC GUIDANCE FLUOROSCOPY TIME:  1.8 minutes; 332 uGym2 DAP TECHNIQUE: The procedure, risks (including but not limited to bleeding, infection, organ damage ), benefits, and alternatives were explained to the patient. Questions regarding the procedure were encouraged and answered. The patient understands and consents to the procedure. Survey ultrasound of the abdomen was performed and an appropriate skin entry site was identified. Skin site was marked, prepped with chlorhexidine, and draped in usual sterile fashion, and infiltrated locally with 1% lidocaine. Intravenous Fentanyl 169mcg and Versed 2mg  were administered as conscious sedation during continuous monitoring of the patient's level of consciousness and physiological / cardiorespiratory status by the radiology RN, with a total moderate sedation time of 15 minutes. Under real-time ultrasound guidance, gallbladder was accessed using a transhepatic approach  with a 21-gauge needle. Ultrasound image documentation was saved. Bile returned through the hub. Needle was exchanged over a 018 guidewire for transitional dilator which allowed placement of 035 short Amplatz wire. Over this, a 10.2 French pigtail catheter was advanced and formed centrally in the gallbladder lumen. Small contrast injection confirmed  appropriate position. Catheter secured externally with 0 Prolene suture and StatLock, and placed to external drain bag. Patient tolerated the procedure well. COMPLICATIONS: COMPLICATIONS none IMPRESSION: 1. Technically successful percutaneous cholecystostomy tube placement with ultrasound and fluoroscopic guidance. Electronically Signed   By: Lucrezia Europe M.D.   On: 11/16/2018 12:18   US Abdomen Limited Ruq  Result Date: 11/15/2018 CLINICAL DATA:  Right upper quadrant pain EXAM: ULTRASOUND ABDOMEN LIMITED RIGHT UPPER QUADRANT COMPARISON:  November 12, 2018 FINDINGS: Gallbladder: Within the gallbladder, there is extensive sludge with intermingled tiny gallstones throughout the sludge. Currently, the gallbladder wall is thickened, and there is pericholecystic fluid. The patient is focally tender over the gallbladder. Common bile duct: Diameter: 2 mm. No intrahepatic or extrahepatic biliary duct dilatation. Liver: No focal lesion identified. Within normal limits in parenchymal echogenicity. Portal vein is patent on color Doppler imaging with normal direction of blood flow towards the liver. Other: None. IMPRESSION: Extensive sludge throughout the gallbladder with tiny intermingled gallstones. There is gallbladder wall thickening and pericholecystic fluid. Patient is focally tender over the gallbladder. These are findings indicative of acute cholecystitis. Study otherwise unremarkable. Electronically Signed   By: Lowella Grip III M.D.   On: 11/15/2018 08:10        Scheduled Meds: . docusate sodium  100 mg Oral BID  . fentaNYL      . folic acid  1 mg Oral Daily  . lidocaine      . midazolam      . multivitamin with minerals  1 tablet Oral Daily  . nicotine  21 mg Transdermal Daily  . sodium chloride flush  3 mL Intravenous Once  . sodium chloride flush  5 mL Intracatheter Q8H  . tamsulosin  0.8 mg Oral Daily  . thiamine  100 mg Oral Daily   Or  . thiamine  100 mg Intravenous Daily   Continuous  Infusions: . lactated ringers 100 mL/hr at 11/16/18 1509  . piperacillin-tazobactam (ZOSYN)  IV 3.375 g (11/16/18 1223)     LOS: 1 day    Time spent: 35 minutes    Elie Confer, MD [facility] Pager [phone]   If 7PM-7AM, please contact night-coverage [email or web address] Password [facility] 11/16/2018, 4:51 PM

## 2018-11-16 NOTE — Progress Notes (Signed)
Pt received back from Radiology s/p drain placement.  Pt sleepy, sats at present 94% on 3L O2.  Dr. Junious Dresser in to see pt.  Pulse 101, continuous pulse ox on and operating.

## 2018-11-16 NOTE — Progress Notes (Signed)
Hooked pt up to continuous pulse ox because concerned with his respiratory status.  Sats initially 87-88% on 3L/O2.  Gave pt IS and instructed him on it.  Pt states he has smoked since he was 68 years old x 2 packs a day.  Informed Radiology that I was concerned about his resp status, she will inform the nurse on today.

## 2018-11-16 NOTE — Progress Notes (Signed)
Subjective/Chief Complaint: Worsening respiratory status. Still w abdominal pain   Objective: Vital signs in last 24 hours: Temp:  [98 F (36.7 C)-99 F (37.2 C)] 99 F (37.2 C) (11/07 0622) Pulse Rate:  [83-124] 110 (11/07 0622) Resp:  [16-18] 18 (11/07 0451) BP: (124-148)/(58-90) 132/85 (11/07 0622) SpO2:  [90 %-100 %] 93 % (11/07 0622) Weight:  [93.5 kg-95.2 kg] 93.5 kg (11/06 2035) Last BM Date: 11/15/18  Intake/Output from previous day: 11/06 0701 - 11/07 0700 In: 2150.5 [I.V.:1000; IV Piggyback:1150.5] Out: 500 [Urine:500] Intake/Output this shift: No intake/output data recorded.  General appearance: ill appearing Resp: wheezing, tachypneic/ dyspneic laying in bed, sat 90% Cardio: HR 99, reg GI: soft, mildly tender RUQ, no peritonitis Skin: warm and dry  Lab Results:  Recent Labs    11/15/18 0555 11/16/18 0400  WBC 19.5* 21.0*  HGB 17.5* 16.1  HCT 51.3 46.9  PLT 132* 135*   BMET Recent Labs    11/15/18 0555 11/16/18 0400  NA 132* 133*  K 3.6 4.1  CL 94* 96*  CO2 27 26  GLUCOSE 141* 108*  BUN 12 11  CREATININE 0.97 0.97  CALCIUM 9.2 9.0   PT/INR Recent Labs    11/15/18 1026  LABPROT 13.2  INR 1.0   ABG No results for input(s): PHART, HCO3 in the last 72 hours.  Invalid input(s): PCO2, PO2  Studies/Results: Dg Chest 2 View  Result Date: 11/15/2018 CLINICAL DATA:  Hypoxia EXAM: CHEST - 2 VIEW COMPARISON:  06/12/2017 FINDINGS: Heart size and vascularity normal. Mild right lower lobe airspace disease is new since the prior study. Left lung is clear. No effusion or edema. Mild hyperinflation of the lungs. IMPRESSION: Mild right lower lobe airspace disease. Possible pneumonia versus atelectasis. Electronically Signed   By: Franchot Gallo M.D.   On: 11/15/2018 07:57   Ct Angio Chest Pe W And/or Wo Contrast  Result Date: 11/15/2018 CLINICAL DATA:  Elevated D-dimer. Hypoxia. Current smoker. Abnormal chest radiograph. EXAM: CT ANGIOGRAPHY  CHEST WITH CONTRAST TECHNIQUE: Multidetector CT imaging of the chest was performed using the standard protocol during bolus administration of intravenous contrast. Multiplanar CT image reconstructions and MIPs were obtained to evaluate the vascular anatomy. CONTRAST:  156mL OMNIPAQUE IOHEXOL 350 MG/ML SOLN COMPARISON:  Chest radiograph from earlier today. FINDINGS: Cardiovascular: The study is high quality for the evaluation of pulmonary embolism. There are no filling defects in the central, lobar, segmental or subsegmental pulmonary artery branches to suggest acute pulmonary embolism. Great vessels are normal in course and caliber. Normal heart size. No significant pericardial fluid/thickening. Three-vessel coronary atherosclerosis. Mediastinum/Nodes: Prominent goiter predominantly enlarged on the left with substernal extension on the left with right tracheal deviation and slight tracheal narrowing, without discrete thyroid nodule on CT. Nonspecific circumferential wall thickening in the lower thoracic esophagus. Fluid level in the midthoracic esophagus. No pathologically enlarged axillary, mediastinal or hilar lymph nodes. Lungs/Pleura: No pneumothorax. No pleural effusion. Severe centrilobular and paraseptal emphysema with mild diffuse bronchial wall thickening. No acute consolidative airspace disease or lung masses. Mild platelike scarring versus atelectasis in right lower lobe. Several scattered solid pulmonary nodules in both lungs, largest 7 mm in the lingula (series 6/image 101). Upper abdomen: Mild gallbladder wall thickening and pericholecystic fat stranding. Tiny granulomatous splenic calcification. Musculoskeletal: No aggressive appearing focal osseous lesions. Mild thoracic spondylosis. Review of the MIP images confirms the above findings. IMPRESSION: 1. No pulmonary embolism. 2. Mild gallbladder wall thickening and pericholecystic fat stranding in the visualized upper abdomen, compatible  with reported  acute cholecystitis on abdominal sonogram study from earlier today. 3. Scattered solid pulmonary nodules, largest 7 mm in the lingula. Non-contrast chest CT at 3-6 months is recommended. If the nodules are stable at time of repeat CT, then future CT at 18-24 months (from today's scan) is considered optional for low-risk patients, but is recommended for high-risk patients. This recommendation follows the consensus statement: Guidelines for Management of Incidental Pulmonary Nodules Detected on CT Images: From the Fleischner Society 2017; Radiology 2017; 284:228-243. 4. Nonspecific circumferential wall thickening in the lower thoracic esophagus. While most commonly due to reflux esophagitis, Barrett's esophagus or esophageal neoplasm cannot be excluded by CT. GI consultation suggested. 5. Prominent goiter, asymmetric to the left. Thyroid ultrasound evaluation suggested on a short term outpatient basis. Aortic Atherosclerosis (ICD10-I70.0) and Emphysema (ICD10-J43.9). Electronically Signed   By: Ilona Sorrel M.D.   On: 11/15/2018 12:29   US Abdomen Limited Ruq  Result Date: 11/15/2018 CLINICAL DATA:  Right upper quadrant pain EXAM: ULTRASOUND ABDOMEN LIMITED RIGHT UPPER QUADRANT COMPARISON:  November 12, 2018 FINDINGS: Gallbladder: Within the gallbladder, there is extensive sludge with intermingled tiny gallstones throughout the sludge. Currently, the gallbladder wall is thickened, and there is pericholecystic fluid. The patient is focally tender over the gallbladder. Common bile duct: Diameter: 2 mm. No intrahepatic or extrahepatic biliary duct dilatation. Liver: No focal lesion identified. Within normal limits in parenchymal echogenicity. Portal vein is patent on color Doppler imaging with normal direction of blood flow towards the liver. Other: None. IMPRESSION: Extensive sludge throughout the gallbladder with tiny intermingled gallstones. There is gallbladder wall thickening and pericholecystic fluid. Patient is  focally tender over the gallbladder. These are findings indicative of acute cholecystitis. Study otherwise unremarkable. Electronically Signed   By: Lowella Grip III M.D.   On: 11/15/2018 08:10    Anti-infectives: Anti-infectives (From admission, onward)   Start     Dose/Rate Route Frequency Ordered Stop   11/15/18 1100  piperacillin-tazobactam (ZOSYN) IVPB 3.375 g     3.375 g 12.5 mL/hr over 240 Minutes Intravenous Every 8 hours 11/15/18 1022     11/15/18 0845  cefTRIAXone (ROCEPHIN) 2 g in sodium chloride 0.9 % 100 mL IVPB     2 g 200 mL/hr over 30 Minutes Intravenous  Once 11/15/18 0836 11/15/18 0934      Assessment/Plan: CAD, Hx of MI s/p stent - previously on Brilinta (reports he is no longer taking) Hypoxia - ? RLL PNA on Xray. CTA neg for PE Hyperglycemia  Hyponatremia  - Above per [facility] -   Acute Cholecystitis - Given patients cardiac hx and new hypoxia, recommend Perc Chole Drain by IR. planning for today - continue IV Zosyn  FEN - NPO VTE - SCDs ID - Rocephin x 1 in ED. Zosyn 11/6 >>  LOS: 1 day    Clovis Riley 11/16/2018

## 2018-11-16 NOTE — Progress Notes (Signed)
Pts' O2 sat at 87 on room air, maintained pt on oxygen at 3L, O2 sat went up to 92

## 2018-11-16 NOTE — Progress Notes (Signed)
Called for PRN resp tx due to pts low sats 80s, Dr. Junious Dresser informed.  Pt on cont pulse ox and O2/3L.

## 2018-11-16 NOTE — Procedures (Signed)
  Procedure: Perc cholecystostomy catheter placement   EBL:   minimal Complications:  none immediate  See full dictation in [facility].  Dillard Cannon MD Main # [phone] Pager  [phone]

## 2018-11-16 NOTE — Sedation Documentation (Signed)
Awaiting transport.

## 2018-11-17 ENCOUNTER — Inpatient Hospital Stay (HOSPITAL_COMMUNITY): Payer: Medicare Other

## 2018-11-17 DIAGNOSIS — R918 Other nonspecific abnormal finding of lung field: Secondary | ICD-10-CM

## 2018-11-17 DIAGNOSIS — Z720 Tobacco use: Secondary | ICD-10-CM

## 2018-11-17 DIAGNOSIS — Z79899 Other long term (current) drug therapy: Secondary | ICD-10-CM

## 2018-11-17 DIAGNOSIS — J9601 Acute respiratory failure with hypoxia: Secondary | ICD-10-CM

## 2018-11-17 DIAGNOSIS — K819 Cholecystitis, unspecified: Secondary | ICD-10-CM

## 2018-11-17 DIAGNOSIS — F17200 Nicotine dependence, unspecified, uncomplicated: Secondary | ICD-10-CM

## 2018-11-17 DIAGNOSIS — G4734 Idiopathic sleep related nonobstructive alveolar hypoventilation: Secondary | ICD-10-CM

## 2018-11-17 LAB — CBC
HCT: 43.3 % (ref 39.0–52.0)
Hemoglobin: 14.1 g/dL (ref 13.0–17.0)
MCH: 28.3 pg (ref 26.0–34.0)
MCHC: 32.6 g/dL (ref 30.0–36.0)
MCV: 86.8 fL (ref 80.0–100.0)
Platelets: 111 10*3/uL — ABNORMAL LOW (ref 150–400)
RBC: 4.99 MIL/uL (ref 4.22–5.81)
RDW: 12.8 % (ref 11.5–15.5)
WBC: 11.7 10*3/uL — ABNORMAL HIGH (ref 4.0–10.5)
nRBC: 0 % (ref 0.0–0.2)

## 2018-11-17 LAB — COMPREHENSIVE METABOLIC PANEL
ALT: 66 U/L — ABNORMAL HIGH (ref 0–44)
AST: 33 U/L (ref 15–41)
Albumin: 2.3 g/dL — ABNORMAL LOW (ref 3.5–5.0)
Alkaline Phosphatase: 112 U/L (ref 38–126)
Anion gap: 9 (ref 5–15)
BUN: 18 mg/dL (ref 8–23)
CO2: 29 mmol/L (ref 22–32)
Calcium: 8.5 mg/dL — ABNORMAL LOW (ref 8.9–10.3)
Chloride: 97 mmol/L — ABNORMAL LOW (ref 98–111)
Creatinine, Ser: 1.03 mg/dL (ref 0.61–1.24)
GFR calc Af Amer: >60 mL/min (ref >60)
GFR calc non Af Amer: >60 mL/min (ref >60)
Glucose, Bld: 92 mg/dL (ref 70–99)
Potassium: 4.2 mmol/L (ref 3.5–5.1)
Sodium: 135 mmol/L (ref 135–145)
Total Bilirubin: 1 mg/dL (ref 0.3–1.2)
Total Protein: 5.4 g/dL — ABNORMAL LOW (ref 6.5–8.1)

## 2018-11-17 LAB — BLOOD GAS, ARTERIAL
Acid-Base Excess: 5.3 mmol/L — ABNORMAL HIGH (ref 0.0–2.0)
Bicarbonate: 30.2 mmol/L — ABNORMAL HIGH (ref 20.0–28.0)
FIO2: 44
O2 Saturation: 87.5 %
Patient temperature: 37.6
pCO2 arterial: 54 mmHg — ABNORMAL HIGH (ref 32.0–48.0)
pH, Arterial: 7.37 (ref 7.350–7.450)
pO2, Arterial: 55.3 mmHg — ABNORMAL LOW (ref 83.0–108.0)

## 2018-11-17 LAB — MAGNESIUM: Magnesium: 2.4 mg/dL (ref 1.7–2.4)

## 2018-11-17 LAB — MRSA PCR SCREENING: MRSA by PCR: NEGATIVE

## 2018-11-17 LAB — GLUCOSE, CAPILLARY: Glucose-Capillary: 130 mg/dL — ABNORMAL HIGH (ref 70–99)

## 2018-11-17 LAB — PHOSPHORUS: Phosphorus: 4.6 mg/dL (ref 2.5–4.6)

## 2018-11-17 MED ORDER — HYDROCODONE-ACETAMINOPHEN 5-325 MG PO TABS
1.0000 | ORAL_TABLET | ORAL | Status: DC | PRN
  Administered 2018-11-17 – 2018-11-18 (×2): 1 via ORAL
  Filled 2018-11-17 (×2): qty 1

## 2018-11-17 MED ORDER — NICOTINE 14 MG/24HR TD PT24
14.0000 mg | MEDICATED_PATCH | Freq: Every day | TRANSDERMAL | Status: DC
  Administered 2018-11-18: 14 mg via TRANSDERMAL
  Filled 2018-11-17: qty 1

## 2018-11-17 MED ORDER — VANCOMYCIN HCL 10 G IV SOLR
2250.0000 mg | INTRAVENOUS | Status: DC
  Administered 2018-11-17: 2250 mg via INTRAVENOUS
  Filled 2018-11-17: qty 2250
  Filled 2018-11-17: qty 2000

## 2018-11-17 MED ORDER — VANCOMYCIN HCL 10 G IV SOLR
2000.0000 mg | INTRAVENOUS | Status: DC
  Filled 2018-11-17: qty 2000

## 2018-11-17 MED ORDER — LORAZEPAM 1 MG PO TABS: 1.0000 mg | ORAL_TABLET | ORAL | Status: DC | PRN

## 2018-11-17 MED ORDER — DEXMEDETOMIDINE HCL IN NACL 400 MCG/100ML IV SOLN
0.2000 ug/kg/h | INTRAVENOUS | Status: DC
  Administered 2018-11-17: 0.5 ug/kg/h via INTRAVENOUS
  Filled 2018-11-17 (×2): qty 100

## 2018-11-17 MED ORDER — ORAL CARE MOUTH RINSE
15.0000 mL | Freq: Two times a day (BID) | OROMUCOSAL | Status: DC
  Administered 2018-11-17 – 2018-11-18 (×2): 15 mL via OROMUCOSAL

## 2018-11-17 MED ORDER — LORAZEPAM 2 MG/ML IJ SOLN
INTRAMUSCULAR | Status: AC
  Filled 2018-11-17: qty 1

## 2018-11-17 MED ORDER — SODIUM CHLORIDE 0.9 % IV SOLN
INTRAVENOUS | Status: DC | PRN
  Administered 2018-11-17: 500 mL via INTRAVENOUS

## 2018-11-17 MED ORDER — LORAZEPAM 2 MG/ML IJ SOLN
1.0000 mg | INTRAMUSCULAR | Status: DC | PRN
  Administered 2018-11-17 (×2): 2 mg via INTRAVENOUS
  Filled 2018-11-17: qty 1

## 2018-11-17 MED ORDER — CHLORDIAZEPOXIDE HCL 5 MG PO CAPS
10.0000 mg | ORAL_CAPSULE | Freq: Three times a day (TID) | ORAL | Status: DC
  Filled 2018-11-17: qty 2

## 2018-11-17 MED ORDER — CLONIDINE HCL 0.1 MG PO TABS
0.1000 mg | ORAL_TABLET | Freq: Two times a day (BID) | ORAL | Status: DC
  Administered 2018-11-17: 0.1 mg via ORAL
  Filled 2018-11-17: qty 1

## 2018-11-17 MED ORDER — CLONIDINE HCL 0.2 MG PO TABS: 0.2000 mg | ORAL_TABLET | Freq: Two times a day (BID) | ORAL | Status: DC

## 2018-11-17 MED ORDER — IPRATROPIUM-ALBUTEROL 0.5-2.5 (3) MG/3ML IN SOLN
3.0000 mL | Freq: Four times a day (QID) | RESPIRATORY_TRACT | Status: DC
  Administered 2018-11-17 – 2018-11-18 (×6): 3 mL via RESPIRATORY_TRACT
  Filled 2018-11-17 (×7): qty 3

## 2018-11-17 MED ORDER — SODIUM CHLORIDE 3 % IN NEBU
4.0000 mL | INHALATION_SOLUTION | Freq: Once | RESPIRATORY_TRACT | Status: AC
  Administered 2018-11-17: 4 mL via RESPIRATORY_TRACT
  Filled 2018-11-17: qty 4

## 2018-11-17 MED ORDER — IPRATROPIUM-ALBUTEROL 0.5-2.5 (3) MG/3ML IN SOLN
3.0000 mL | Freq: Two times a day (BID) | RESPIRATORY_TRACT | Status: DC
  Administered 2018-11-17: 3 mL via RESPIRATORY_TRACT
  Filled 2018-11-17: qty 3

## 2018-11-17 MED ORDER — DEXAMETHASONE SODIUM PHOSPHATE 10 MG/ML IJ SOLN
6.0000 mg | INTRAMUSCULAR | Status: DC
  Administered 2018-11-17: 6 mg via INTRAVENOUS
  Filled 2018-11-17: qty 0.6

## 2018-11-17 MED ORDER — LORAZEPAM 2 MG/ML IJ SOLN: 1.0000 mg | INTRAMUSCULAR | Status: DC | PRN

## 2018-11-17 NOTE — Progress Notes (Signed)
Called Respiratory to get ABG done ( in Rapid situation) but will come when she can.

## 2018-11-17 NOTE — Significant Event (Signed)
Rapid Response Event Note  Staff called with concerns of patient being extremely agitated, per staff, patient pulled off the BIPAP, he was awake now but very agitated, aggressive, and belligerent. CIWA 26. It took 5 staff members to safely control the patient, [facility] MD was paged and he arrived at the bedside as well. Patient was given Ativan 2 mg IV, medications were adjusted. [facility] MD spoke with PCCM MD as well, MD came to bedside, transferred to 3M07. His respiratory status is unchanged, currently on HFNC 8L.   Start Time 1340 End Time 1543  Majesty Oehlert R

## 2018-11-17 NOTE — Progress Notes (Addendum)
Upon patient assessment and attempt to change out dressing around percutaneous drain this nurse noted patient has pulled drain almost all the way out. This nurse reached out to DR on call and made aware. Will reinforce drain until further assessment can be done

## 2018-11-17 NOTE — Progress Notes (Addendum)
Patient ID: David Hendricks, male   DOB: 24-Oct-1950, 68 y.o.   MRN: [id number]  PROGRESS NOTE    David Hendricks  [id number] DOB: March 29, 1950 DOA: 11/15/2018   PCP: David Nutting, DO    Brief Narrative: 68 year old male with history of coronary artery disease status post PCI, benign prostatic hypertrophy, remote history of alcohol abuse admitted for recurrent abdominal pain.  Patient was seen in the ER for right flank pain on September 17, 2018 and CT scan was unremarkable.  He was discharged and returned back to the ED on 11/12/2018 with right upper quadrant pain.  Ultrasound was negative for cholecystitis but CT scan of the abdomen and pelvis showed gallbladder sludge along with pulmonary nodules. On return to the ED yesterday, ultrasound of the right upper quadrant showed biliary sludge and CT of the abdomen showed nephrolithiasis, and lung nodules.  On presentation in the ED yesterday, patient was noted with hypoxia saturating at 88 to 89% on room air.  Chest x-ray was concerning for infiltrates, along with elevated liver function tests and white blood cell count. CT angiogram of the chest showed: IMPRESSION: 1. No pulmonary embolism. 2. Mild gallbladder wall thickening and pericholecystic fat stranding in the visualized upper abdomen, compatible with reported acute cholecystitis on abdominal sonogram study from earlier today. 3. Scattered solid pulmonary nodules, largest 7 mm in the lingula. Non-contrast chest CT at 3-6 months is recommended. If the nodules are stable at time of repeat CT, then future CT at 18-24 months (from today's scan) is considered optional for low-risk patients, but is recommended for high-risk patients. This recommendation follows the consensus statement: Guidelines for Management of Incidental Pulmonary Nodules Detected on CT Images: From the Fleischner Society 2017; Radiology 2017; 284:228-243. 4. Nonspecific circumferential wall thickening in the lower  thoracic esophagus. While most commonly due to reflux esophagitis, Barrett's esophagus or esophageal neoplasm cannot be excluded by CT. GI consultation suggested. 5. Prominent goiter, asymmetric to the left. Thyroid ultrasound evaluation suggested on a short term outpatient basis. Due to extensive cardiac and pulmonary pathology, general surgery is unable to perform any surgical intervention but consulted interventional radiology for percutaneous cholecystostomy. Patient is status post percutaneous cholecystostomy. Patient was noted to be agitated overnight and continued to desaturate requiring up to 9 L of O2 by nasal cannula.  Respiratory gave breathing treatment and chest physical therapy due to possible congestion of the lungs. Patient was also given multiple doses of Ativan for possible alcohol withdrawal given elevation in blood pressure and tachycardia. With continued respiratory distress and hypoxia, saturating at 90% on 6 L O2, rapid response was called at to evaluate patient. Recommended transfer to progressive care for continued management and likely airway protection if worsening respiratory distress.   Assessment & Plan:   Principal Problem:   Cholecystitis Active Problems:   Benign prostatic hyperplasia with urinary frequency   CAD (coronary artery disease)   Cigarette nicotine dependence without complication   Goiter   Pulmonary nodules/lesions, multiple   Esophageal thickening  Clinical problems list 1.  Acute cholecystitis/biliary dyskinesia 2.  Acute hypoxic respiratory failure 3.  Coronary artery disease status post PCI 4.  Multiple pulmonary nodules 5.Benign prostatic hyperplasia without urinary frequency 6.  Esophageal thickening, concerning for Barrett's versus neoplasm 7.  Tobacco use disorder 8.  Alcohol withdrawal  1.  Acute cholecystitis/biliary dyskinesia.  CT angio consistent with gallbladder wall thickening with pericholecystic fat stranding, coupled  with ultrasound finding of biliary sludge.  Patient is not a candidate for surgical  intervention due to extensive cardiopulmonary pathology. He is status post percutaneous cholecystostomy by interventional radiology. Will switch to Unasyn 3 g every 6 hourly.  Consultation with infectious disease.   Gentle IV fluid hydration  2.  Acute hypoxic respiratory failure.  Etiology uncertain.  Chest x-ray showed right upper lobe airspace disease likely pneumonia versus atelectasis.  Patient has been to the hospital multiple times and concern for possible hospital-acquired pneumonia.  Discussed with infectious disease attending on call and recommended Unasyn Patient continued to decompensate with respiratory distress overnight requiring up to 9 L O2 by nasal cannula.  He is currently on 6 L O2 and saturating at barely 90%.  Rapid response team recommended transfer to progressive care. Continue with Unasyn 3 g every 6 hourly I will add vancomycin pharmacy to dose  3.  Coronary artery disease status post PCI with DES to pLAD. Normal EF Continue with aspirin, statin and beta-blocker  4.  Multiple pulmonary nodules.  Findings on CT angiogram of multiple pulmonary nodules with the largest measuring 7 mm.  It is pertinent to note that chest x-ray showed right lower lobe airspace disease.  Concerns for possible aspiration pneumonia versus metastatic lesion from esophageal neoplasm.  Discussed with ID on-call and he recommended Unasyn   5.  Benign prostatic hyperplasia without urinary symptoms Continue with Flomax 0.4 mg daily  6.  Esophageal thickening, concerning for esophagitis versus Barrett's versus neoplasm. Will consult GI to further evaluate  7.  Tobacco use disorder Counseling on tobacco cessation-3 minutes Continue with nicotine patch  8.  Alcohol withdrawal.  Patient agitated overnight coupled with autonomic instability.  Remote history of alcohol ingestion and blood alcohol level was less than  10.  Better assessment tool of alcohol consumption in the last 3 to 5 days is Ethyl glucuronide assay. Patient is currently on CIWA protocol.  He received multiple doses of Ativan to calm him down overnight. Will add chlordiazepoxide 10 mg 3 times daily Add clonidine 0.1 mg twice daily   DVT prophylaxis: Lovenox subacute  Code Status: Full  Family Communication: None at bedside  Disposition Plan: Pending clinical improvement    Consultants:   General surgery  Interventional radiology  Procedures: Percutaneous cholecystostomy    Antimicrobials: Zosyn, then discontinued per consultation with ID  Unasyn 3 g every 6 hourly  Vancomycin pharmacy to dose   Subjective: Patient is status post percutaneous cholecystostomy tube insertion by interventional radiology.  He is stuporous but arousable with noxious stimuli.  Tachypneic with respiratory rate of 22. On 6 L O2 by nasal cannula and saturating at 90%.   Objective: Vitals:   11/16/18 2015 11/17/18 0012 11/17/18 0527 11/17/18 0627  BP: (!) 126/49 (!) 130/55 130/63   Pulse: (!) 103 (!) 115 94 98  Resp:  17 18 20   Temp: 98.9 F (37.2 C) 99.2 F (37.3 C) 97.8 F (36.6 C)   TempSrc:  Oral Oral   SpO2: 94% (!) 89% 93% 94%  Weight:      Height:        Intake/Output Summary (Last 24 hours) at 11/17/2018 0756 Last data filed at 11/17/2018 0507 Gross per 24 hour  Intake 3079.65 ml  Output 330 ml  Net 2749.65 ml   Filed Weights   11/15/18 0940 11/15/18 2035  Weight: 95.2 kg 93.5 kg    Examination:  General exam: Stuporous but arousable with noxious stimuli.  Possibly oversedation with Ativan. Respiratory system: Diminished breath sounds on right mid and lower lung zone.  Tachypneic   On 6 L O2 by nasal cannula and saturating in the 90% Cardiovascular system: S1 & S2 heard, RRR. No JVD, murmurs, rubs, gallops or clicks. No pedal edema. Gastrointestinal system: Dressing on right upper quadrant status post cholecystostomy.   JP drain in place.  Dressing clean and dry.  Abdomen is non distended. -Normal bowel sounds heard. Central nervous system: Stuporous, arousable with noxious stimuli but unable to sustain arousal for 1 minute.  Possible oversedation with Ativan.  Received multiple doses overnight.  No neurological deficits. Extremities: Moves all extremities equally with noxious stimuli.  . Skin: No rashes, lesions or ulcers Psychiatry: Unable to assess   Data Reviewed: I have personally reviewed following labs and imaging studies  CBC: Recent Labs  Lab 11/12/18 0240 11/13/18 0934 11/15/18 0555 11/16/18 0400 11/17/18 0303  WBC 15.4* 14.8* 19.5* 21.0* 11.7*  NEUTROABS 11.9* 10.9*  --   --   --   HGB 17.6* 17.2* 17.5* 16.1 14.1  HCT 53.2* 51.6 51.3 46.9 43.3  MCV 85.4 85.7 83.6 83.9 86.8  PLT 155 147* 132* 135* [id number]*   Basic Metabolic Panel: Recent Labs  Lab 11/12/18 0240 11/13/18 0934 11/15/18 0555 11/16/18 0400 11/17/18 0303  NA 140 135 132* 133* 135  K 3.9 3.7 3.6 4.1 4.2  CL 102 100 94* 96* 97*  CO2 29 28 27 26 29   GLUCOSE 115* 114* 141* 108* 92  BUN 17 11 12 11 18   CREATININE 1.01 0.98 0.97 0.97 1.03  CALCIUM 9.2 8.9 9.2 9.0 8.5*  MG  --   --   --   --  2.4  PHOS  --   --   --   --  4.6   GFR: Estimated Creatinine Clearance: 80.4 mL/min (by C-G formula based on SCr of 1.03 mg/dL). Liver Function Tests: Recent Labs  Lab 11/12/18 0240 11/13/18 0934 11/15/18 0555 11/16/18 0400 11/17/18 0303  AST 19 17 57* 96* 33  ALT 17 16 67* 111* 66*  ALKPHOS 100 100 115 150* 112  BILITOT 0.5 1.3* 2.5* 3.6* 1.0  PROT 7.0 7.0 6.6 6.2* 5.4*  ALBUMIN 4.0 4.1 3.4* 2.8* 2.3*   Recent Labs  Lab 11/12/18 0240 11/13/18 0934 11/15/18 0555  LIPASE 47 34 23   No results for input(s): AMMONIA in the last 168 hours. Coagulation Profile: Recent Labs  Lab 11/15/18 1026  INR 1.0   Cardiac Enzymes: No results for input(s): CKTOTAL, CKMB, CKMBINDEX, TROPONINI in the last 168 hours. BNP (last  3 results) No results for input(s): PROBNP in the last 8760 hours. HbA1C: No results for input(s): HGBA1C in the last 72 hours. CBG: No results for input(s): GLUCAP in the last 168 hours. Lipid Profile: No results for input(s): CHOL, HDL, LDLCALC, TRIG, CHOLHDL, LDLDIRECT in the last 72 hours. Thyroid Function Tests: No results for input(s): TSH, T4TOTAL, FREET4, T3FREE, THYROIDAB in the last 72 hours. Anemia Panel: No results for input(s): VITAMINB12, FOLATE, FERRITIN, TIBC, IRON, RETICCTPCT in the last 72 hours. Sepsis Labs: No results for input(s): PROCALCITON, LATICACIDVEN in the last 168 hours.  Recent Results (from the past 240 hour(s))  SARS CORONAVIRUS 2 (TAT 6-24 HRS) Nasopharyngeal Nasopharyngeal Swab     Status: None   Collection Time: 11/15/18  8:12 AM   Specimen: Nasopharyngeal Swab  Result Value Ref Range Status   SARS Coronavirus 2 NEGATIVE NEGATIVE Final    Comment: (NOTE) SARS-CoV-2 target nucleic acids are NOT DETECTED. The SARS-CoV-2 RNA is generally detectable in upper and  lower respiratory specimens during the acute phase of infection. Negative results do not preclude SARS-CoV-2 infection, do not rule out co-infections with other pathogens, and should not be used as the sole basis for treatment or other patient management decisions. Negative results must be combined with clinical observations, patient history, and epidemiological information. The expected result is Negative. Fact Sheet for Patients: SugarRoll.be Fact Sheet for Healthcare Providers: https://www.woods-mathews.com/ This test is not yet approved or cleared by the [place] FDA and  has been authorized for detection and/or diagnosis of SARS-CoV-2 by FDA under an Emergency Use Authorization (EUA). This EUA will remain  in effect (meaning this test can be used) for the duration of the COVID-19 declaration under Section 56 4(b)(1) of the Act, 21  U.S.C. section 360bbb-3(b)(1), unless the authorization is terminated or revoked sooner. Performed at [facility], [place] [address] [place], [place] [address]   Aerobic/Anaerobic Culture (surgical/deep wound)     Status: None (Preliminary result)   Collection Time: 11/16/18 12:02 PM   Specimen: PATH GI biopsy; Bile  Result Value Ref Range Status   Specimen Description GALL BLADDER  Final   Special Requests NONE  Final   Gram Stain   Final    NO WBC SEEN NO ORGANISMS SEEN Performed at [facility], [place] [address], [place], [place] [address]    Culture PENDING  Incomplete   Report Status PENDING  Incomplete         Radiology Studies: Ct Angio Chest Pe W And/or Wo Contrast  Result Date: 11/15/2018 CLINICAL DATA:  Elevated D-dimer. Hypoxia. Current smoker. Abnormal chest radiograph. EXAM: CT ANGIOGRAPHY CHEST WITH CONTRAST TECHNIQUE: Multidetector CT imaging of the chest was performed using the standard protocol during bolus administration of intravenous contrast. Multiplanar CT image reconstructions and MIPs were obtained to evaluate the vascular anatomy. CONTRAST:  120mL OMNIPAQUE IOHEXOL 350 MG/ML SOLN COMPARISON:  Chest radiograph from earlier today. FINDINGS: Cardiovascular: The study is high quality for the evaluation of pulmonary embolism. There are no filling defects in the central, lobar, segmental or subsegmental pulmonary artery branches to suggest acute pulmonary embolism. Great vessels are normal in course and caliber. Normal heart size. No significant pericardial fluid/thickening. Three-vessel coronary atherosclerosis. Mediastinum/Nodes: Prominent goiter predominantly enlarged on the left with substernal extension on the left with right tracheal deviation and slight tracheal narrowing, without discrete thyroid nodule on CT. Nonspecific circumferential wall thickening in the lower thoracic esophagus. Fluid level in the midthoracic esophagus. No pathologically enlarged  axillary, mediastinal or hilar lymph nodes. Lungs/Pleura: No pneumothorax. No pleural effusion. Severe centrilobular and paraseptal emphysema with mild diffuse bronchial wall thickening. No acute consolidative airspace disease or lung masses. Mild platelike scarring versus atelectasis in right lower lobe. Several scattered solid pulmonary nodules in both lungs, largest 7 mm in the lingula (series 6/image 101). Upper abdomen: Mild gallbladder wall thickening and pericholecystic fat stranding. Tiny granulomatous splenic calcification. Musculoskeletal: No aggressive appearing focal osseous lesions. Mild thoracic spondylosis. Review of the MIP images confirms the above findings. IMPRESSION: 1. No pulmonary embolism. 2. Mild gallbladder wall thickening and pericholecystic fat stranding in the visualized upper abdomen, compatible with reported acute cholecystitis on abdominal sonogram study from earlier today. 3. Scattered solid pulmonary nodules, largest 7 mm in the lingula. Non-contrast chest CT at 3-6 months is recommended. If the nodules are stable at time of repeat CT, then future CT at 18-24 months (from today's scan) is considered optional for low-risk patients, but is recommended for high-risk  patients. This recommendation follows the consensus statement: Guidelines for Management of Incidental Pulmonary Nodules Detected on CT Images: From the Fleischner Society 2017; Radiology 2017; 284:228-243. 4. Nonspecific circumferential wall thickening in the lower thoracic esophagus. While most commonly due to reflux esophagitis, Barrett's esophagus or esophageal neoplasm cannot be excluded by CT. GI consultation suggested. 5. Prominent goiter, asymmetric to the left. Thyroid ultrasound evaluation suggested on a short term outpatient basis. Aortic Atherosclerosis (ICD10-I70.0) and Emphysema (ICD10-J43.9). Electronically Signed   By: Ilona Sorrel M.D.   On: 11/15/2018 12:29   Ir Perc Cholecystostomy  Result Date:  11/16/2018 CLINICAL DATA:  Acute cholecystitis. Poor surgical candidate. Percutaneous drainage requested. EXAM: PERCUTANEOUS CHOLECYSTOSTOMY TUBE PLACEMENT WITH ULTRASOUND AND FLUOROSCOPIC GUIDANCE FLUOROSCOPY TIME:  1.8 minutes; 332 uGym2 DAP TECHNIQUE: The procedure, risks (including but not limited to bleeding, infection, organ damage ), benefits, and alternatives were explained to the patient. Questions regarding the procedure were encouraged and answered. The patient understands and consents to the procedure. Survey ultrasound of the abdomen was performed and an appropriate skin entry site was identified. Skin site was marked, prepped with chlorhexidine, and draped in usual sterile fashion, and infiltrated locally with 1% lidocaine. Intravenous Fentanyl 126mcg and Versed 2mg  were administered as conscious sedation during continuous monitoring of the patient's level of consciousness and physiological / cardiorespiratory status by the radiology RN, with a total moderate sedation time of 15 minutes. Under real-time ultrasound guidance, gallbladder was accessed using a transhepatic approach with a 21-gauge needle. Ultrasound image documentation was saved. Bile returned through the hub. Needle was exchanged over a 018 guidewire for transitional dilator which allowed placement of 035 short Amplatz wire. Over this, a 10.2 French pigtail catheter was advanced and formed centrally in the gallbladder lumen. Small contrast injection confirmed appropriate position. Catheter secured externally with 0 Prolene suture and StatLock, and placed to external drain bag. Patient tolerated the procedure well. COMPLICATIONS: COMPLICATIONS none IMPRESSION: 1. Technically successful percutaneous cholecystostomy tube placement with ultrasound and fluoroscopic guidance. Electronically Signed   By: Lucrezia Europe M.D.   On: 11/16/2018 12:18        Scheduled Meds:  budesonide (PULMICORT) nebulizer solution  0.5 mg Nebulization BID    chlordiazePOXIDE  10 mg Oral TID   cloNIDine  0.1 mg Oral BID   dexamethasone (DECADRON) injection  6 mg Intravenous Q24H   docusate sodium  100 mg Oral BID   folic acid  1 mg Oral Daily   ipratropium-albuterol  3 mL Nebulization BID   multivitamin with minerals  1 tablet Oral Daily   nicotine  21 mg Transdermal Daily   sodium chloride flush  3 mL Intravenous Once   sodium chloride flush  5 mL Intracatheter Q8H   tamsulosin  0.8 mg Oral Daily   thiamine  100 mg Oral Daily   Or   thiamine  100 mg Intravenous Daily   Continuous Infusions:  ampicillin-sulbactam (UNASYN) IV 3 g (11/17/18 0507)   lactated ringers 100 mL/hr at 11/16/18 1509     LOS: 2 days    Time spent: 35 minutes    Elie Confer, MD [facility] Pager [phone]   If 7PM-7AM, please contact night-coverage [email or web address] Password [facility] 11/17/2018, 7:56 AM

## 2018-11-17 NOTE — Progress Notes (Signed)
2300: Pt now responsive to voice and able to follow commands. RN asked how much pt drinks, but states he does not drink. Pt alert and oriented to person, place, and situation. Pt states he came to the hospital for gall stones but does not know why he got so confused. RN reminded pt that he has a biliary drain for his cholecystitis and to not pull on the tubing. Call bell within reach and bed alarm is on. Will continue to monitor closely.  Clint Bolder, RN 11/17/18 11:07 PM

## 2018-11-17 NOTE — Progress Notes (Signed)
Pt is able to state his name, birthdate, [place] for place but not sure on the date or day.  When questionned about drinking he states he drinks a beer but not every day.  He does take cough medicine.  Cardiac monitoring initiated.  Pt is on cont pulse ox.  O2 at 6L at present sat is 93%.

## 2018-11-17 NOTE — Progress Notes (Signed)
Pt agitated and removing Bipap. Consulted PCCM, received order to try to switch to HFNC and monitor. Pt is on 6L on HFNC, O2 sat 92-94%. Pt calmed down and sleeping.  Idolina Primer, RN

## 2018-11-17 NOTE — Progress Notes (Signed)
Pharmacy Antibiotic Note  David Hendricks is a 68 y.o. male admitted on 11/15/2018 with pneumonia.  Pharmacy has been consulted for Vancomycin dosing.  CC/HPI: Recurrent abdominal pain, 3rd ED visit ultrasound of the right upper quadrant showed biliary sludge and CT of the abdomen showed nephrolithiasis, and lung nodules. Hypoxia, CXR with infiltrates, elevated LFT's  PMH: CAD, BPH, h/o alcohol,   Significant events: seen in the ER for right flank pain on September 17, 2018 and CT scan was unremarkable. He was discharged and returned back to the ED on 11/12/2018 with right upper quadrant pain.  Ultrasound was negative for cholecystitis but CT scan of the abdomen and pelvis showed gallbladder sludge along with pulmonary nodules.  ID: PNA. Tmax 99.7. WBC 1905>21>11.7 on 6L O2.   Vanco 11/8>> Unasyn 11/7>>  11/6: COVID: negative 11/7: Gall bladder bile>>  Vancomycin 2250 mg IV Q 24 hrs. Goal AUC 400-550. Expected AUC: 505.9 SCr used: 1.03  Plan: Dexamethasone 6mg  IV qday Vancomycin 2250mg  IV q 24h. Peak and trough at steady state if continued. Unasyn 3g IV q6 hrs. Change to Zosyn if pseudomonal coverage desired   Height: 6' (182.9 cm) Weight: 206 lb 2.1 oz (93.5 kg) IBW/kg (Calculated) : 77.6  Temp (24hrs), Avg:99 F (37.2 C), Min:97.8 F (36.6 C), Max:99.9 F (37.7 C)  Recent Labs  Lab 11/12/18 0240 11/13/18 0934 11/15/18 0555 11/16/18 0400 11/17/18 0303  WBC 15.4* 14.8* 19.5* 21.0* 11.7*  CREATININE 1.01 0.98 0.97 0.97 1.03    Estimated Creatinine Clearance: 80.4 mL/min (by C-G formula based on SCr of 1.03 mg/dL).    No Known Allergies  Amberlynn Tempesta S. Alford Highland, PharmD, BCPS Clinical Staff Pharmacist.  Eilene Ghazi [facility] 11/17/2018 8:39 AM

## 2018-11-17 NOTE — Consult Note (Signed)
NAME:  David Hendricks, MRN:  [id number], DOB:  [birth year], LOS: 2 ADMISSION DATE:  11/15/2018, CONSULTATION DATE: 11/17/2018 REFERRING MD:  Dr. Junious Dresser, CHIEF COMPLAINT: Altered mental status  Brief History   This is a 68 year old gentleman with past medical history of coronary artery disease status post PCI, history of alcohol use recurrent abdominal pain found to have evidence of cholecystitis underwent percutaneous cholecystostomy tube by interventional radiology on 11/16/2018.  Patient was given additional doses of Ativan and morphine overnight and was found to have altered mental status this morning and transferred from regular medical floor to progressive care unit.  Pulmonary was consulted for recommendations on potential transfer to the intensive care unit  History of present illness   68 year old gentleman past medical history of coronary artery disease status post PCI, history of alcohol use found to have cholecystitis underwent right-sided cholecystostomy tube for drainage by interventional radiology on 11/16/2018.  Overnight became progressively obtunded and the decision was made for transfer to the PCU floor.  He had received multiple doses of Ativan overnight due to concern for worsening withdrawal symptoms.  He had also received fentanyl and Versed periprocedural yesterday.  Was given a oral dose of clonidine this morning as well as IV morphine.  Patient also receiving 6 mg of Decadron daily. I believe this is the biggest reason for his change in mental state.  On exam on the floor.  Patient is arousable to voice.  He is able to answer some basic commands and tell us his name.  He did have an arterial blood gas which did not show CO2 retention however there was hypoxemia.  He is currently on a open facemask.  Rapid response nurse was called.  And posterior oropharyngeal suctioning revealed remnants of his previous breakfast tray.  Past Medical History   Past Medical History:  Diagnosis Date   . Anxiety   . Arthritis   . Coronary artery disease    10/18 PCI/DES to pLAD, mild nonobstructive disease in the Lcx/RCA. Normal EF.   Marland Kitchen Dysrhythmia 2016   irregular heartbeat  . Myocardial infarction ([place])   . Seasonal allergies      Significant Hospital Events   11/17/2018: Rapid response for altered mental status and hypoxemia  Consults:  Interventional radiology General surgery  Procedures:  11/16/2018 percutaneous cholecystostomy tube 11/16/2018: Gallbladder aspiration negative no growth to date  Significant Diagnostic Tests:  11/15/2018 CT chest: IMPRESSION: 1. No pulmonary embolism. 2. Mild gallbladder wall thickening and pericholecystic fat stranding in the visualized upper abdomen, compatible with reported acute cholecystitis on abdominal sonogram study from earlier today. 3. Scattered solid pulmonary nodules, largest 7 mm in the lingula. Non-contrast chest CT at 3-6 months is recommended. If the nodules are stable at time of repeat CT, then future CT at 18-24 months (from today's scan) is considered optional for low-risk patients, but is recommended for high-risk patients. This recommendation follows the consensus statement: Guidelines for Management of Incidental Pulmonary Nodules Detected on CT Images: From the Fleischner Society 2017; Radiology 2017; 284:228-243. 4. Nonspecific circumferential wall thickening in the lower thoracic esophagus. While most commonly due to reflux esophagitis, Barrett's esophagus or esophageal neoplasm cannot be excluded by CT. GI consultation suggested. 5. Prominent goiter, asymmetric to the left. Thyroid ultrasound evaluation suggested on a short term outpatient basis.  Micro Data:  11/15/2018: COVID-19 negative  Antimicrobials:  Per primary  Interim history/subjective:  Please see HPI above  Objective   Blood pressure (!) 158/82, pulse (!) 123, temperature 98.4  F (36.9 C), resp. rate 20, height 6' (1.829 m), weight 93.5  kg, SpO2 93 %.        Intake/Output Summary (Last 24 hours) at 11/17/2018 1152 Last data filed at 11/17/2018 0507 Gross per 24 hour  Intake 3079.65 ml  Output 330 ml  Net 2749.65 ml   Filed Weights   11/15/18 0940 11/15/18 2035  Weight: 95.2 kg 93.5 kg    Examination: General: Male, resting in bed, obtunded however will arouse to voice saying his name answer questions clearly HENT: NCAT, sclera clear we will attempt to track, blinks at attack Lungs: Shallow breaths however clear to auscultation, some upper airway noise, clears with secretions Cardiovascular: Regular rate rhythm, S1-S2 no MRG Abdomen: Soft, nontender nondistended, right upper quadrant percutaneous drain Extremities: Warm, no rash Neuro: Alert to voice, will follow some basic commands, at least answer his name GU: Foley in place  Resolved Hospital Problem list     Assessment & Plan:   Acute hypoxemic respiratory failure requiring O2 supplementation. Presume this to be from hypoventilation and atelectasis in the setting of oversedation from multiple medications.  In the past less than 24 hours has received fentanyl, Versed, Ativan, morphine, clonidine, trazodone, Decadron. - Patient has had sleep study in the past with no OSA, AHI less than 5 He does have a history of alcohol use however he has no current autonomic or sympathetic responses that are consistent with active alcohol withdrawal. Sepsis, cholecystitis, elevated alk phos, transaminitis, elevated bilirubin Altered mental status secondary to above 90-pack-year history of smoking, baseline CO2 retention from arterial blood gas analysis, compensated chronic respiratory acidosis, likely underlying COPD no PFTs on file.  Nocturnal hypoxemia on previous split-night sleep study. Plan: Suction with RT May be a good candidate for trial of BiPAP as a his mental status improves. Scheduled nebs Hypertonic x1 Avoid all sedating medications. Continue antibiotics per  primary If there is evidence of autonomic instability consistent with alcohol withdrawal would consider decreasing the dose of as needed Ativan to see if there is response to control symptoms. Dislodged percutaneous drain to be corrected by IR.  7 mm lung nodule. This will need to be followed up as an outpatient. Repeat CT imaging per Fleischner criteria  Circumferential distal esophageal thickening Likely need GI evaluation   Labs   CBC: Recent Labs  Lab 11/12/18 0240 11/13/18 0934 11/15/18 0555 11/16/18 0400 11/17/18 0303  WBC 15.4* 14.8* 19.5* 21.0* 11.7*  NEUTROABS 11.9* 10.9*  --   --   --   HGB 17.6* 17.2* 17.5* 16.1 14.1  HCT 53.2* 51.6 51.3 46.9 43.3  MCV 85.4 85.7 83.6 83.9 86.8  PLT 155 147* 132* 135* 111*    Basic Metabolic Panel: Recent Labs  Lab 11/12/18 0240 11/13/18 0934 11/15/18 0555 11/16/18 0400 11/17/18 0303  NA 140 135 132* 133* 135  K 3.9 3.7 3.6 4.1 4.2  CL 102 100 94* 96* 97*  CO2 '29 28 27 26 29  ' GLUCOSE 115* 114* 141* 108* 92  BUN '17 11 12 11 18  ' CREATININE 1.01 0.98 0.97 0.97 1.03  CALCIUM 9.2 8.9 9.2 9.0 8.5*  MG  --   --   --   --  2.4  PHOS  --   --   --   --  4.6   GFR: Estimated Creatinine Clearance: 80.4 mL/min (by C-G formula based on SCr of 1.03 mg/dL). Recent Labs  Lab 11/13/18 0934 11/15/18 0555 11/16/18 0400 11/17/18 0303  WBC 14.8* 19.5*  21.0* 11.7*    Liver Function Tests: Recent Labs  Lab 11/12/18 0240 11/13/18 0934 11/15/18 0555 11/16/18 0400 11/17/18 0303  AST 19 17 57* 96* 33  ALT 17 16 67* 111* 66*  ALKPHOS 100 100 115 150* 112  BILITOT 0.5 1.3* 2.5* 3.6* 1.0  PROT 7.0 7.0 6.6 6.2* 5.4*  ALBUMIN 4.0 4.1 3.4* 2.8* 2.3*   Recent Labs  Lab 11/12/18 0240 11/13/18 0934 11/15/18 0555  LIPASE 47 34 23   No results for input(s): AMMONIA in the last 168 hours.  ABG    Component Value Date/Time   PHART 7.370 11/17/2018 0908   PCO2ART 54.0 (H) 11/17/2018 0908   PO2ART 55.3 (L) 11/17/2018 0908    HCO3 30.2 (H) 11/17/2018 0908   TCO2 28 04/02/2017 1527   O2SAT 87.5 11/17/2018 0908     Coagulation Profile: Recent Labs  Lab 11/15/18 1026  INR 1.0    Cardiac Enzymes: No results for input(s): CKTOTAL, CKMB, CKMBINDEX, TROPONINI in the last 168 hours.  HbA1C: Hgb A1c MFr Bld  Date/Time Value Ref Range Status  05/03/2018 12:01 PM 5.5 4.6 - 6.5 % Final    Comment:    Glycemic Control Guidelines for People with Diabetes:Non Diabetic:  <6%Goal of Therapy: <7%Additional Action Suggested:  >8%   10/09/2016 08:09 PM 5.1 4.8 - 5.6 % Final    Comment:    (NOTE) Pre diabetes:          5.7%-6.4% Diabetes:              >6.4% Glycemic control for   <7.0% adults with diabetes     CBG: No results for input(s): GLUCAP in the last 168 hours.  Review of Systems:    Patient denies pain.  Limited interaction to be able to obtain full review of systems due to encephalopathy.  Past Medical History  He,  has a past medical history of Anxiety, Arthritis, Coronary artery disease, Dysrhythmia (2016), Myocardial infarction ([place]), and Seasonal allergies.   Surgical History    Past Surgical History:  Procedure Laterality Date  . CORONARY ANGIOPLASTY  10/09/2016  . CORONARY STENT INTERVENTION N/A 10/09/2016   Procedure: CORONARY STENT INTERVENTION;  Surgeon: Nelva Bush, MD;  Location: [facility];  Service: Cardiovascular;  Laterality: N/A;  . INTRAVASCULAR ULTRASOUND/IVUS N/A 10/09/2016   Procedure: Intravascular Ultrasound/IVUS;  Surgeon: Nelva Bush, MD;  Location: [facility];  Service: Cardiovascular;  Laterality: N/A;  . IR PERC CHOLECYSTOSTOMY  11/16/2018  . LEFT HEART CATH AND CORONARY ANGIOGRAPHY N/A 10/09/2016   Procedure: LEFT HEART CATH AND CORONARY ANGIOGRAPHY;  Surgeon: Nelva Bush, MD;  Location: [facility];  Service: Cardiovascular;  Laterality: N/A;     Social History   reports that he has been smoking cigarettes. He started smoking about  57 years ago. He has a 90.00 pack-year smoking history. He has never used smokeless tobacco. He reports that he does not drink alcohol or use drugs.   Family History   His family history includes Cancer in his father.   Allergies No Known Allergies   Home Medications  Prior to Admission medications   Medication Sig Start Date End Date Taking? Authorizing Provider  methocarbamol (ROBAXIN) 500 MG tablet Take 1 tablet (500 mg total) by mouth 2 (two) times daily. Patient taking differently: Take 500 mg by mouth 2 (two) times daily as needed for muscle spasms.  09/17/18  Yes Lacretia Leigh, MD  traMADol (ULTRAM) 50 MG tablet Take 1 tablet (  50 mg total) by mouth every 12 (twelve) hours as needed for up to 5 days. 11/14/18 11/19/18 Yes [name], DO  tamsulosin (FLOMAX) 0.4 MG CAPS capsule Take 0.8 mg by mouth daily.    [provider]  traZODone (DESYREL) 100 MG tablet Take 0.5-1 tablets (50-100 mg total) by mouth at bedtime as needed for sleep. 06/21/17   [name], DO   Garner Nash, DO [facility] 11/17/2018 12:13 PM

## 2018-11-17 NOTE — Progress Notes (Signed)
Patient ID: David Hendricks, male   DOB: 16-Nov-1950, 68 y.o.   MRN: [id number] [facility] Progress Note:   * No surgery found *  Subjective: Mental status is sleeping Objective: Vital signs in last 24 hours: Temp:  [97.8 F (36.6 C)-99.9 F (37.7 C)] 99.9 F (37.7 C) (11/08 0834) Pulse Rate:  [91-115] 107 (11/08 0834) Resp:  [15-24] 20 (11/08 0627) BP: (114-151)/(49-89) 114/65 (11/08 0834) SpO2:  [80 %-95 %] 93 % (11/08 0907)  Intake/Output from previous day: 11/07 0701 - 11/08 0700 In: 3079.7 [P.O.:620; I.V.:2246.8; IV Piggyback:207.9] Out: 330 [Urine:300; Drains:30] Intake/Output this shift: No intake/output data recorded.  Physical Exam: Work of breathing is not labored.  He tried to pull his percutaneous cholecystostomy tube-Dr. Vernard Gambles notified and KUB ordered.  Dark green material is in the tube.    Lab Results:  Results for orders placed or performed during the hospital encounter of 11/15/18 (from the past 48 hour(s))  Protime-INR     Status: None   Collection Time: 11/15/18 10:26 AM  Result Value Ref Range   Prothrombin Time 13.2 11.4 - 15.2 seconds   INR 1.0 0.8 - 1.2    Comment: (NOTE) INR goal varies based on device and disease states. Performed at [facility], [place] [address] [place], [place] [address]   HIV Antibody (routine testing w rflx)     Status: None   Collection Time: 11/15/18  1:45 PM  Result Value Ref Range   HIV Screen 4th Generation wRfx NON REACTIVE NON REACTIVE    Comment: Performed at [facility] [address], [place], [place] [address]  CBC     Status: Abnormal   Collection Time: 11/16/18  4:00 AM  Result Value Ref Range   WBC 21.0 (H) 4.0 - 10.5 K/uL   RBC 5.59 4.22 - 5.81 MIL/uL   Hemoglobin 16.1 13.0 - 17.0 g/dL   HCT 46.9 39.0 - 52.0 %   MCV 83.9 80.0 - 100.0 fL   MCH 28.8 26.0 - 34.0 pg   MCHC 34.3 30.0 - 36.0 g/dL   RDW 12.7 11.5 - 15.5 %   Platelets 135 (L) 150 - 400 K/uL   nRBC 0.0 0.0 - 0.2 %     Comment: Performed at [facility], [place] [address] [place], [place] [address]  Comprehensive metabolic panel     Status: Abnormal   Collection Time: 11/16/18  4:00 AM  Result Value Ref Range   Sodium 133 (L) 135 - 145 mmol/L   Potassium 4.1 3.5 - 5.1 mmol/L   Chloride 96 (L) 98 - 111 mmol/L   CO2 26 22 - 32 mmol/L   Glucose, Bld 108 (H) 70 - 99 mg/dL   BUN 11 8 - 23 mg/dL   Creatinine, Ser 0.97 0.61 - 1.24 mg/dL   Calcium 9.0 8.9 - 10.3 mg/dL   Total Protein 6.2 (L) 6.5 - 8.1 g/dL   Albumin 2.8 (L) 3.5 - 5.0 g/dL   AST 96 (H) 15 - 41 U/L   ALT 111 (H) 0 - 44 U/L   Alkaline Phosphatase 150 (H) 38 - 126 U/L   Total Bilirubin 3.6 (H) 0.3 - 1.2 mg/dL   GFR calc non Af Amer >60 >60 mL/min   GFR calc Af Amer >60 >60 mL/min   Anion gap 11 5 - 15    Comment: Performed at [facility], [place] [address], [place], [place] [address]  Aerobic/Anaerobic Culture (surgical/deep wound)  Status: None (Preliminary result)   Collection Time: 11/16/18 12:02 PM   Specimen: PATH GI biopsy; Bile  Result Value Ref Range   Specimen Description GALL BLADDER    Special Requests NONE    Gram Stain NO WBC SEEN NO ORGANISMS SEEN     Culture      NO GROWTH < 24 HOURS Performed at [facility], [address], [place], [place] [address]    Report Status PENDING   CBC     Status: Abnormal   Collection Time: 11/17/18  3:03 AM  Result Value Ref Range   WBC 11.7 (H) 4.0 - 10.5 K/uL   RBC 4.99 4.22 - 5.81 MIL/uL   Hemoglobin 14.1 13.0 - 17.0 g/dL   HCT 43.3 39.0 - 52.0 %   MCV 86.8 80.0 - 100.0 fL   MCH 28.3 26.0 - 34.0 pg   MCHC 32.6 30.0 - 36.0 g/dL   RDW 12.8 11.5 - 15.5 %   Platelets 111 (L) 150 - 400 K/uL    Comment: REPEATED TO VERIFY PLATELET COUNT CONFIRMED BY SMEAR Immature Platelet Fraction may be clinically indicated, consider ordering this additional test [id number]    nRBC 0.0 0.0 - 0.2 %    Comment: Performed at [facility], [place] [address] [place],  [place] [address]  Comprehensive metabolic panel     Status: Abnormal   Collection Time: 11/17/18  3:03 AM  Result Value Ref Range   Sodium 135 135 - 145 mmol/L   Potassium 4.2 3.5 - 5.1 mmol/L   Chloride 97 (L) 98 - 111 mmol/L   CO2 29 22 - 32 mmol/L   Glucose, Bld 92 70 - 99 mg/dL   BUN 18 8 - 23 mg/dL   Creatinine, Ser 1.03 0.61 - 1.24 mg/dL   Calcium 8.5 (L) 8.9 - 10.3 mg/dL   Total Protein 5.4 (L) 6.5 - 8.1 g/dL   Albumin 2.3 (L) 3.5 - 5.0 g/dL   AST 33 15 - 41 U/L   ALT 66 (H) 0 - 44 U/L   Alkaline Phosphatase 112 38 - 126 U/L   Total Bilirubin 1.0 0.3 - 1.2 mg/dL   GFR calc non Af Amer >60 >60 mL/min   GFR calc Af Amer >60 >60 mL/min   Anion gap 9 5 - 15    Comment: Performed at [facility], [place] [address], [place], [place] [address]  Magnesium     Status: None   Collection Time: 11/17/18  3:03 AM  Result Value Ref Range   Magnesium 2.4 1.7 - 2.4 mg/dL    Comment: Performed at [facility] [address], [place], [place] [address]  Phosphorus     Status: None   Collection Time: 11/17/18  3:03 AM  Result Value Ref Range   Phosphorus 4.6 2.5 - 4.6 mg/dL    Comment: Performed at [facility] [address] [place], [place] [address]  Blood gas, arterial     Status: Abnormal   Collection Time: 11/17/18  9:08 AM  Result Value Ref Range   FIO2 44.00    pH, Arterial 7.370 7.350 - 7.450   pCO2 arterial 54.0 (H) 32.0 - 48.0 mmHg   pO2, Arterial 55.3 (L) 83.0 - 108.0 mmHg   Bicarbonate 30.2 (H) 20.0 - 28.0 mmol/L   Acid-Base Excess 5.3 (H) 0.0 - 2.0 mmol/L   O2 Saturation 87.5 %   Patient temperature 37.6    Collection site  RIGHT RADIAL    Drawn by UNKNOWN    Sample type ARTERIAL DRAW    Allens test (pass/fail) PASS PASS    Comment: Performed at [facility], [place] [address], [place], [place] [address]    Radiology/Results: Ct Angio Chest Pe W And/or Wo Contrast  Result Date: 11/15/2018 CLINICAL DATA:  Elevated D-dimer. Hypoxia. Current smoker.  Abnormal chest radiograph. EXAM: CT ANGIOGRAPHY CHEST WITH CONTRAST TECHNIQUE: Multidetector CT imaging of the chest was performed using the standard protocol during bolus administration of intravenous contrast. Multiplanar CT image reconstructions and MIPs were obtained to evaluate the vascular anatomy. CONTRAST:  183mL OMNIPAQUE IOHEXOL 350 MG/ML SOLN COMPARISON:  Chest radiograph from earlier today. FINDINGS: Cardiovascular: The study is high quality for the evaluation of pulmonary embolism. There are no filling defects in the central, lobar, segmental or subsegmental pulmonary artery branches to suggest acute pulmonary embolism. Great vessels are normal in course and caliber. Normal heart size. No significant pericardial fluid/thickening. Three-vessel coronary atherosclerosis. Mediastinum/Nodes: Prominent goiter predominantly enlarged on the left with substernal extension on the left with right tracheal deviation and slight tracheal narrowing, without discrete thyroid nodule on CT. Nonspecific circumferential wall thickening in the lower thoracic esophagus. Fluid level in the midthoracic esophagus. No pathologically enlarged axillary, mediastinal or hilar lymph nodes. Lungs/Pleura: No pneumothorax. No pleural effusion. Severe centrilobular and paraseptal emphysema with mild diffuse bronchial wall thickening. No acute consolidative airspace disease or lung masses. Mild platelike scarring versus atelectasis in right lower lobe. Several scattered solid pulmonary nodules in both lungs, largest 7 mm in the lingula (series 6/image 101). Upper abdomen: Mild gallbladder wall thickening and pericholecystic fat stranding. Tiny granulomatous splenic calcification. Musculoskeletal: No aggressive appearing focal osseous lesions. Mild thoracic spondylosis. Review of the MIP images confirms the above findings. IMPRESSION: 1. No pulmonary embolism. 2. Mild gallbladder wall thickening and pericholecystic fat stranding in the  visualized upper abdomen, compatible with reported acute cholecystitis on abdominal sonogram study from earlier today. 3. Scattered solid pulmonary nodules, largest 7 mm in the lingula. Non-contrast chest CT at 3-6 months is recommended. If the nodules are stable at time of repeat CT, then future CT at 18-24 months (from today's scan) is considered optional for low-risk patients, but is recommended for high-risk patients. This recommendation follows the consensus statement: Guidelines for Management of Incidental Pulmonary Nodules Detected on CT Images: From the Fleischner Society 2017; Radiology 2017; 284:228-243. 4. Nonspecific circumferential wall thickening in the lower thoracic esophagus. While most commonly due to reflux esophagitis, Barrett's esophagus or esophageal neoplasm cannot be excluded by CT. GI consultation suggested. 5. Prominent goiter, asymmetric to the left. Thyroid ultrasound evaluation suggested on a short term outpatient basis. Aortic Atherosclerosis (ICD10-I70.0) and Emphysema (ICD10-J43.9). Electronically Signed   By: Ilona Sorrel M.D.   On: 11/15/2018 12:29   Ir Perc Cholecystostomy  Result Date: 11/16/2018 CLINICAL DATA:  Acute cholecystitis. Poor surgical candidate. Percutaneous drainage requested. EXAM: PERCUTANEOUS CHOLECYSTOSTOMY TUBE PLACEMENT WITH ULTRASOUND AND FLUOROSCOPIC GUIDANCE FLUOROSCOPY TIME:  1.8 minutes; 332 uGym2 DAP TECHNIQUE: The procedure, risks (including but not limited to bleeding, infection, organ damage ), benefits, and alternatives were explained to the patient. Questions regarding the procedure were encouraged and answered. The patient understands and consents to the procedure. Survey ultrasound of the abdomen was performed and an appropriate skin entry site was identified. Skin site was marked, prepped with chlorhexidine, and draped in usual sterile fashion, and infiltrated locally with 1% lidocaine. Intravenous Fentanyl 16mcg and Versed 2mg  were  administered as conscious sedation during continuous monitoring of the patient's level of consciousness and physiological / cardiorespiratory status by the radiology RN, with a total moderate sedation time of 15 minutes. Under real-time ultrasound guidance, gallbladder was accessed using a transhepatic approach with a 21-gauge needle. Ultrasound image documentation was saved. Bile returned through the hub. Needle was exchanged over a 018 guidewire for transitional dilator which allowed placement of 035 short Amplatz wire. Over this, a 10.2 French pigtail catheter was advanced and formed centrally in the gallbladder lumen. Small contrast injection confirmed appropriate position. Catheter secured externally with 0 Prolene suture and StatLock, and placed to external drain bag. Patient tolerated the procedure well. COMPLICATIONS: COMPLICATIONS none IMPRESSION: 1. Technically successful percutaneous cholecystostomy tube placement with ultrasound and fluoroscopic guidance. Electronically Signed   By: Lucrezia Europe M.D.   On: 11/16/2018 12:18    Anti-infectives: Anti-infectives (From admission, onward)   Start     Dose/Rate Route Frequency Ordered Stop   11/17/18 0930  vancomycin (VANCOCIN) 2,000 mg in sodium chloride 0.9 % 500 mL IVPB  Status:  Discontinued     2,000 mg 250 mL/hr over 120 Minutes Intravenous NOW 11/17/18 0836 11/17/18 0838   11/17/18 0930  vancomycin (VANCOCIN) 2,250 mg in sodium chloride 0.9 % 500 mL IVPB     2,250 mg 250 mL/hr over 120 Minutes Intravenous Every 24 hours 11/17/18 0838     11/16/18 1730  Ampicillin-Sulbactam (UNASYN) 3 g in sodium chloride 0.9 % 100 mL IVPB     3 g 200 mL/hr over 30 Minutes Intravenous Every 6 hours 11/16/18 1727     11/15/18 1100  piperacillin-tazobactam (ZOSYN) IVPB 3.375 g  Status:  Discontinued     3.375 g 12.5 mL/hr over 240 Minutes Intravenous Every 8 hours 11/15/18 1022 11/16/18 1727   11/15/18 0845  cefTRIAXone (ROCEPHIN) 2 g in sodium chloride 0.9  % 100 mL IVPB     2 g 200 mL/hr over 30 Minutes Intravenous  Once 11/15/18 0836 11/15/18 0934      Assessment/Plan: Problem List: Patient Active Problem List   Diagnosis Date Noted  . Cholecystitis 11/15/2018  . Goiter 11/15/2018  . Pulmonary nodules/lesions, multiple 11/15/2018  . Esophageal thickening 11/15/2018  . Abdominal pain 11/14/2018  . Cellulitis 08/14/2018  . Leg cramping 05/01/2018  . Depression, major, single episode, mild ([place]) 06/21/2017  . Rib pain on left side 06/21/2017  . Pain of finger of left hand 05/21/2017  . Cigarette nicotine dependence without complication [id number]  . Vertigo 05/10/2017  . Old MI (myocardial infarction) 01/30/2017  . CAD (coronary artery disease) 01/30/2017  . Chest pain with moderate risk of acute coronary syndrome 01/23/2017  . Hyperlipidemia 10/10/2016  . History of non-ST elevation myocardial infarction (NSTEMI) 10/09/2016  . Difficulty sleeping 04/22/2016  . Urinary dysfunction 04/22/2016  . Benign prostatic hyperplasia with urinary frequency 02/11/2016  . Subacromial bursitis of right shoulder joint 02/11/2016    Tried to pull out drain.  Confusion?  xrays pending to check if this needs to be replaced.   * No surgery found *    LOS: 2 days   Matt B. Hassell Done, MD, [facility] [phone] beeper [phone]  11/17/2018 9:16 AM

## 2018-11-17 NOTE — Progress Notes (Signed)
CPT held today due to pt being agitated and combative.

## 2018-11-17 NOTE — Progress Notes (Signed)
Called and gave report to [place], Rn on 6E for 18.  ABGs obtained by Respiratory.

## 2018-11-17 NOTE — Progress Notes (Signed)
Pt on 6L O2 with sat 92%.  Lungs sound junky.  Respiratory, Dr. Junious Dresser in to see pt.  Orders received to transfer pt.  Will check ABG. Cardiac monitoring added.

## 2018-11-17 NOTE — Progress Notes (Signed)
Called to give report

## 2018-11-17 NOTE — Progress Notes (Signed)
  Imaging shows cholecystostomy tube has pulled back out of the gallbladder.  Will attempt to replace, however if gallbladder is not distended, may be unsuccessful.   Kelsay Haggard S Maneh Sieben PA-C 11/17/2018 10:44 AM

## 2018-11-17 NOTE — Progress Notes (Signed)
PCCM Interval Note  David Hendricks is an alcoholic, has a hx CAD, and was admitted with acute cholecystitis. A perc GB drain was placed 11/7. Course has been c/b evidence for EtOH withdrawal. He pulled his GB drain out this morning.   He was moved to progressive care and started on CIWA monitoring, prn benzos. He received bolused ativan + MSO4, steroids. Resulted in obtundation earlier today. Adjustments were made to his meds. He has awakened, displayed agitated delirium, lashed out and is now in 4-point restraints.   Vitals:   11/17/18 0944 11/17/18 1209 11/17/18 1221 11/17/18 1403  BP: (!) 158/82 127/73    Pulse: (!) 123 100    Resp:  (!) 25    Temp: 98.4 F (36.9 C)     TempSrc:      SpO2:  95% 94% 91%  Weight:      Height:      He wakes to vigorous stim and is combative - just received 2mg  ativan. Snoring respirations. Coarse BS. Heart regular and borderline tachy. Abdomen obese, soft.   EtOH withdrawal and agitated delirium.  - don't believe we will be able to manage him with standard CIWA, favor transfer to ICU for precedex infusion.  - goal d/c restraints once we get his meds adjusted  Probable chronic resp failure due to presumed COPD, mild OSA (not treated, AHI 5) - could consider BiPAP going forward. He is too somnolent to protect airway with BiPAP at this time. Would be high risk.   Acute cholecystitis Suspected RLL aspiration PNA - will need his GB drain replaced when stable to do so. Will need to discuss with IR - abx  >> vanco, unasyn   Critical Care time 32 minutes  Baltazar Apo, MD, PhD 11/17/2018, 3:04 PM [facility] [phone] or if no answer [phone]

## 2018-11-17 NOTE — Significant Event (Signed)
Rapid Response Event Note  Overview: Withdrawal   Initial Focused Assessment: Nurse called with concerns of patient having signs of withdrawal, per nurse MD was at the bedside, patient was given Ativan 2 mg IV. I was with another patient in a medical emergency so I instructed the nurse to speak with the doctor.  I came to see the patient at 1000 on 6E16 (patient moved to PCU, for further monitoring prior to me seeing him). Upon arrival, patient was quite lethargic, he would wake, moves all extremities spontaneously, quite disorientated, tremors mild. Oxygen saturations were only 84% on HNFC 8L, I transitioned the patient VM 8L 40% and his saturations did not improve and actually dropped into the 70s. I placed the patient NRB 15L 100% and saturations improved to 93%. RR was 28-34 at times, shallow breaths. Lung sounds - rhonchi, thick yellow secretions were suctioned several times, I feel like the patient might have aspirated. HR/BP stable. PCCM was consulted, placed on BIPAP, saturations and RR improved, patient was calmer as well.   Interventions: -- CXR -- ABG was done prior to my arrival, PaO2 -55  -- BIPAP   Plan of Care: -- Monitor VS and respiratory status  -- Assess CIWA -- Delirium Precautions  -- High Aspiration Risk  -- High Intubation Risk   Event Summary:  Call Time 0719 Arrival Time 1000 End Time [place], [place]

## 2018-11-17 NOTE — Progress Notes (Signed)
Called Dr. Vernard Gambles and let him know that pt pulled the drain out some during the night, will check PKUB, placed order.

## 2018-11-18 ENCOUNTER — Encounter (HOSPITAL_COMMUNITY): Payer: Self-pay | Admitting: Interventional Radiology

## 2018-11-18 ENCOUNTER — Inpatient Hospital Stay (HOSPITAL_COMMUNITY): Payer: Medicare Other

## 2018-11-18 HISTORY — PX: IR CHOLANGIOGRAM EXISTING TUBE: IMG6040

## 2018-11-18 MED ORDER — LIDOCAINE HCL 1 % IJ SOLN
INTRAMUSCULAR | Status: AC
  Filled 2018-11-18: qty 20

## 2018-11-18 MED ORDER — IOHEXOL 300 MG/ML  SOLN
50.0000 mL | Freq: Once | INTRAMUSCULAR | Status: AC | PRN
  Administered 2018-11-18: 10 mL

## 2018-11-18 MED ORDER — LIDOCAINE HCL 1 % IJ SOLN: INTRAMUSCULAR | Status: AC | PRN

## 2018-11-18 MED ORDER — SALINE SPRAY 0.65 % NA SOLN
1.0000 | NASAL | Status: DC | PRN
  Filled 2018-11-18: qty 44

## 2018-11-18 MED ORDER — FENTANYL CITRATE (PF) 100 MCG/2ML IJ SOLN
25.0000 ug | Freq: Once | INTRAMUSCULAR | Status: AC
  Administered 2018-11-18: 25 ug via INTRAVENOUS
  Filled 2018-11-18: qty 2

## 2018-11-18 MED ORDER — HALOPERIDOL LACTATE 5 MG/ML IJ SOLN
2.5000 mg | Freq: Four times a day (QID) | INTRAMUSCULAR | Status: DC | PRN
  Administered 2018-11-18: 5 mg via INTRAVENOUS
  Filled 2018-11-18 (×2): qty 1

## 2018-11-18 NOTE — Plan of Care (Signed)
  Problem: Clinical Measurements: Goal: Ability to maintain clinical measurements within normal limits will improve Outcome: Progressing   Problem: Elimination: Goal: Will not experience complications related to urinary retention Outcome: Progressing   Problem: Safety: Goal: Ability to remain free from injury will improve Outcome: Progressing   Problem: Skin Integrity: Goal: Risk for impaired skin integrity will decrease Outcome: Progressing

## 2018-11-18 NOTE — Progress Notes (Signed)
Referring Physician(s): C[facility] Dr Franco Collet  Supervising Physician: Daryll Brod  Patient Status:  [facility] - In-pt  Chief Complaint:  Cholecystitis Perc chole drain placed in IR 11/7  Subjective:  Pt became agitated and pulled on chole tube MD feels may have some malfunction/dislodgement Bleeding into bag  Request for evaluation and replacement Scheduled now for same  Allergies: Patient has no known allergies.  Medications: Prior to Admission medications   Medication Sig Start Date End Date Taking? Authorizing Provider  methocarbamol (ROBAXIN) 500 MG tablet Take 1 tablet (500 mg total) by mouth 2 (two) times daily. Patient taking differently: Take 500 mg by mouth 2 (two) times daily as needed for muscle spasms.  09/17/18  Yes Lacretia Leigh, MD  traMADol (ULTRAM) 50 MG tablet Take 1 tablet (50 mg total) by mouth every 12 (twelve) hours as needed for up to 5 days. 11/14/18 11/19/18 Yes [name], DO  tamsulosin (FLOMAX) 0.4 MG CAPS capsule Take 0.8 mg by mouth daily.    [provider]  traZODone (DESYREL) 100 MG tablet Take 0.5-1 tablets (50-100 mg total) by mouth at bedtime as needed for sleep. 06/21/17   [name], DO     Vital Signs: BP (!) 104/50    Pulse 66    Temp 97.6 F (36.4 C) (Oral)    Resp 18    Ht 6' (1.829 m)    Wt 206 lb 2.1 oz (93.5 kg)    SpO2 98%    BMI 27.96 kg/m   Physical Exam Skin:    General: Skin is warm and dry.     Comments: Skin site is clean and dry Suture is intact; no bleeding; no hematoma  OP is bloody 20 cc in bag      Imaging: Dg Chest 2 View  Result Date: 11/15/2018 CLINICAL DATA:  Hypoxia EXAM: CHEST - 2 VIEW COMPARISON:  06/12/2017 FINDINGS: Heart size and vascularity normal. Mild right lower lobe airspace disease is new since the prior study. Left lung is clear. No effusion or edema. Mild hyperinflation of the lungs. IMPRESSION: Mild right lower lobe airspace disease. Possible pneumonia versus atelectasis.  Electronically Signed   By: Franchot Gallo M.D.   On: 11/15/2018 07:57   Ct Angio Chest Pe W And/or Wo Contrast  Result Date: 11/15/2018 CLINICAL DATA:  Elevated D-dimer. Hypoxia. Current smoker. Abnormal chest radiograph. EXAM: CT ANGIOGRAPHY CHEST WITH CONTRAST TECHNIQUE: Multidetector CT imaging of the chest was performed using the standard protocol during bolus administration of intravenous contrast. Multiplanar CT image reconstructions and MIPs were obtained to evaluate the vascular anatomy. CONTRAST:  132mL OMNIPAQUE IOHEXOL 350 MG/ML SOLN COMPARISON:  Chest radiograph from earlier today. FINDINGS: Cardiovascular: The study is high quality for the evaluation of pulmonary embolism. There are no filling defects in the central, lobar, segmental or subsegmental pulmonary artery branches to suggest acute pulmonary embolism. Great vessels are normal in course and caliber. Normal heart size. No significant pericardial fluid/thickening. Three-vessel coronary atherosclerosis. Mediastinum/Nodes: Prominent goiter predominantly enlarged on the left with substernal extension on the left with right tracheal deviation and slight tracheal narrowing, without discrete thyroid nodule on CT. Nonspecific circumferential wall thickening in the lower thoracic esophagus. Fluid level in the midthoracic esophagus. No pathologically enlarged axillary, mediastinal or hilar lymph nodes. Lungs/Pleura: No pneumothorax. No pleural effusion. Severe centrilobular and paraseptal emphysema with mild diffuse bronchial wall thickening. No acute consolidative airspace disease or lung masses. Mild platelike scarring versus atelectasis in right lower lobe. Several scattered  solid pulmonary nodules in both lungs, largest 7 mm in the lingula (series 6/image 101). Upper abdomen: Mild gallbladder wall thickening and pericholecystic fat stranding. Tiny granulomatous splenic calcification. Musculoskeletal: No aggressive appearing focal osseous lesions.  Mild thoracic spondylosis. Review of the MIP images confirms the above findings. IMPRESSION: 1. No pulmonary embolism. 2. Mild gallbladder wall thickening and pericholecystic fat stranding in the visualized upper abdomen, compatible with reported acute cholecystitis on abdominal sonogram study from earlier today. 3. Scattered solid pulmonary nodules, largest 7 mm in the lingula. Non-contrast chest CT at 3-6 months is recommended. If the nodules are stable at time of repeat CT, then future CT at 18-24 months (from today's scan) is considered optional for low-risk patients, but is recommended for high-risk patients. This recommendation follows the consensus statement: Guidelines for Management of Incidental Pulmonary Nodules Detected on CT Images: From the Fleischner Society 2017; Radiology 2017; 284:228-243. 4. Nonspecific circumferential wall thickening in the lower thoracic esophagus. While most commonly due to reflux esophagitis, Barrett's esophagus or esophageal neoplasm cannot be excluded by CT. GI consultation suggested. 5. Prominent goiter, asymmetric to the left. Thyroid ultrasound evaluation suggested on a short term outpatient basis. Aortic Atherosclerosis (ICD10-I70.0) and Emphysema (ICD10-J43.9). Electronically Signed   By: Ilona Sorrel M.D.   On: 11/15/2018 12:29   Ir Perc Cholecystostomy  Result Date: 11/16/2018 CLINICAL DATA:  Acute cholecystitis. Poor surgical candidate. Percutaneous drainage requested. EXAM: PERCUTANEOUS CHOLECYSTOSTOMY TUBE PLACEMENT WITH ULTRASOUND AND FLUOROSCOPIC GUIDANCE FLUOROSCOPY TIME:  1.8 minutes; 332 uGym2 DAP TECHNIQUE: The procedure, risks (including but not limited to bleeding, infection, organ damage ), benefits, and alternatives were explained to the patient. Questions regarding the procedure were encouraged and answered. The patient understands and consents to the procedure. Survey ultrasound of the abdomen was performed and an appropriate skin entry site was  identified. Skin site was marked, prepped with chlorhexidine, and draped in usual sterile fashion, and infiltrated locally with 1% lidocaine. Intravenous Fentanyl 133mcg and Versed 2mg  were administered as conscious sedation during continuous monitoring of the patient's level of consciousness and physiological / cardiorespiratory status by the radiology RN, with a total moderate sedation time of 15 minutes. Under real-time ultrasound guidance, gallbladder was accessed using a transhepatic approach with a 21-gauge needle. Ultrasound image documentation was saved. Bile returned through the hub. Needle was exchanged over a 018 guidewire for transitional dilator which allowed placement of 035 short Amplatz wire. Over this, a 10.2 French pigtail catheter was advanced and formed centrally in the gallbladder lumen. Small contrast injection confirmed appropriate position. Catheter secured externally with 0 Prolene suture and StatLock, and placed to external drain bag. Patient tolerated the procedure well. COMPLICATIONS: COMPLICATIONS none IMPRESSION: 1. Technically successful percutaneous cholecystostomy tube placement with ultrasound and fluoroscopic guidance. Electronically Signed   By: Lucrezia Europe M.D.   On: 11/16/2018 12:18   Dg Chest Port 1 View  Result Date: 11/17/2018 CLINICAL DATA:  Respiratory distress EXAM: PORTABLE CHEST 1 VIEW COMPARISON:  11/15/2018 FINDINGS: Interval increase in heterogeneous airspace opacity at the right lung base, consistent with atelectasis or consolidation and a possible small right pleural effusion. Emphysema. The heart and mediastinum are normal. IMPRESSION: Interval increase in heterogeneous airspace opacity at the right lung base, consistent with atelectasis or consolidation and a possible small right pleural effusion. Electronically Signed   By: Eddie Candle M.D.   On: 11/17/2018 13:12   Dg Abd Portable 1v  Result Date: 11/17/2018 CLINICAL DATA:  Blood from cholecystostomy  catheter after catheter was  pulled, evaluate placement. Persistent bilious output. EXAM: PORTABLE ABDOMEN - 1 VIEW COMPARISON:  11/16/2018 FINDINGS: Cholecystostomy tube projects at the periphery of right upper abdomen, significantly displaced laterally from its initial placement radiograph. A few gas distended mid abdominal small bowel loops. Moderate proximal colonic fecal material without dilatation. 8 mm left renal calculus. Regional bones unremarkable. IMPRESSION: 1. Probable malposition of cholecystostomy catheter. We can  attempt repositioning under fluoroscopy. Electronically Signed   By: Lucrezia Europe M.D.   On: 11/17/2018 10:30   US Abdomen Limited Ruq  Result Date: 11/15/2018 CLINICAL DATA:  Right upper quadrant pain EXAM: ULTRASOUND ABDOMEN LIMITED RIGHT UPPER QUADRANT COMPARISON:  November 12, 2018 FINDINGS: Gallbladder: Within the gallbladder, there is extensive sludge with intermingled tiny gallstones throughout the sludge. Currently, the gallbladder wall is thickened, and there is pericholecystic fluid. The patient is focally tender over the gallbladder. Common bile duct: Diameter: 2 mm. No intrahepatic or extrahepatic biliary duct dilatation. Liver: No focal lesion identified. Within normal limits in parenchymal echogenicity. Portal vein is patent on color Doppler imaging with normal direction of blood flow towards the liver. Other: None. IMPRESSION: Extensive sludge throughout the gallbladder with tiny intermingled gallstones. There is gallbladder wall thickening and pericholecystic fluid. Patient is focally tender over the gallbladder. These are findings indicative of acute cholecystitis. Study otherwise unremarkable. Electronically Signed   By: Lowella Grip III M.D.   On: 11/15/2018 08:10    Labs:  CBC: Recent Labs    11/13/18 0934 11/15/18 0555 11/16/18 0400 11/17/18 0303  WBC 14.8* 19.5* 21.0* 11.7*  HGB 17.2* 17.5* 16.1 14.1  HCT 51.6 51.3 46.9 43.3  PLT 147* 132* 135* 111*     COAGS: Recent Labs    11/15/18 1026  INR 1.0    BMP: Recent Labs    11/13/18 0934 11/15/18 0555 11/16/18 0400 11/17/18 0303  NA 135 132* 133* 135  K 3.7 3.6 4.1 4.2  CL 100 94* 96* 97*  CO2 28 27 26 29   GLUCOSE 114* 141* 108* 92  BUN 11 12 11 18   CALCIUM 8.9 9.2 9.0 8.5*  CREATININE 0.98 0.97 0.97 1.03  GFRNONAA >60 >60 >60 >60  GFRAA >60 >60 >60 >60    LIVER FUNCTION TESTS: Recent Labs    11/13/18 0934 11/15/18 0555 11/16/18 0400 11/17/18 0303  BILITOT 1.3* 2.5* 3.6* 1.0  AST 17 57* 96* 33  ALT 16 67* 111* 66*  ALKPHOS 100 115 150* 112  PROT 7.0 6.6 6.2* 5.4*  ALBUMIN 4.1 3.4* 2.8* 2.3*    Assessment and Plan:  Perc chole drain placed in IR 11/7 Pt pulled when agitated and unaware on 11/8 Bleeding into bag Now request for evaluation and possible replacement Pt is aware and agreeable to proceed Consent signed in chart  Electronically Signed: Lavonia Drafts, PA-C 11/18/2018, 8:29 AM   I spent a total of 15 Minutes at the the patient's bedside AND on the patient's hospital floor or unit, greater than 50% of which was counseling/coordinating care for perc chole drain evaluation and possible replacement

## 2018-11-18 NOTE — Plan of Care (Signed)
  Problem: Activity: Goal: Risk for activity intolerance will decrease Outcome: Progressing   Problem: Nutrition: Goal: Adequate nutrition will be maintained Outcome: Progressing   Problem: Elimination: Goal: Will not experience complications related to urinary retention Outcome: Progressing   Problem: Pain Managment: Goal: General experience of comfort will improve Outcome: Progressing   Problem: Skin Integrity: Goal: Risk for impaired skin integrity will decrease Outcome: Progressing   Problem: Clinical Measurements: Goal: Respiratory complications will improve Outcome: Not Progressing  Pt requiring oxygen to maintain O2 saturations >92%, pt needs frequent reminders to keep O2 on his face. Problem: Coping: Goal: Level of anxiety will decrease Outcome: Not Progressing  Pt adamant that he is leaving tomorrow and has intermittent confusion. Pt states he has no pain is is ready to go home. RN reminded pt about his recent gall bladder infection, and the need to monitor him for another night in case of reoccurring infection.

## 2018-11-18 NOTE — Progress Notes (Signed)
Pt requested that RN call his sister-in-law [name] and give her an update about everything that has happened since he has been in the hospital. RN spoke with [name] who is adamant that pt DOES NOT [place]. She states he may smoke too much, but he has never been a drinker and never acted drunk around her and her children. [name] started to become upset, RN provided encouragement and support. All questions and concerns were answered, [name] stated she felt reassured and was thankful for the update.

## 2018-11-18 NOTE — Progress Notes (Signed)
[place] Progress Note Patient Name: Blayke Hlavka DOB: 10-Nov-1950 MRN: [id number]   Date of Service  11/18/2018  HPI/Events of Note  Patient c/o pain not relieved by Norco.   eICU Interventions  Will order: 1. Fentanyl 25 mcg IV X 1.      Intervention Category Major Interventions: Other:  Terissa Haffey Cornelia Copa 11/18/2018, 5:02 AM

## 2018-11-18 NOTE — Progress Notes (Signed)
[place] Progress Note Patient Name: David Hendricks DOB: [birth year] MRN: [id number]   Date of Service  11/18/2018  HPI/Events of Note  Patient c/o pain at perc drain site. Not time for Q 4 hour Vicodin.   eICU Interventions  Will order: 1. Fentanyl 25 mcg IV X 1 now.      Intervention Category Major Interventions: Other:  [name] Cornelia Copa 11/18/2018, 1:59 AM

## 2018-11-18 NOTE — Procedures (Signed)
Retracted cholecystostomy with bloody output  S/p chole drain injection and removal  Chole drain has retracted and is extahepatic  By [place], GB is completely collapsed so unable to replace at this point  Extrahepatic chole drain removed  rec follow clinically and if symptoms recur consider repeat US or CT before any attempt at replacing

## 2018-11-18 NOTE — Progress Notes (Signed)
[facility] Progress Note     Subjective: CC-  Patient transferred to ICU for precedex yesterday afternoon after becoming more agitated/confused and pulling perc chole tube. Currently awake and alert, confused. Complains of abdominal pain. No n/v. Getting OOB to bedside commode.  Objective: Vital signs in last 24 hours: Temp:  [97.5 F (36.4 C)-99.9 F (37.7 C)] 97.8 F (36.6 C) (11/09 0306) Pulse Rate:  [66-123] 66 (11/09 0700) Resp:  [12-26] 12 (11/09 0700) BP: (94-158)/(48-82) 104/50 (11/09 0700) SpO2:  [86 %-99 %] 99 % (11/09 0700) FiO2 (%):  [50 %] 50 % (11/08 1221) Last BM Date: 11/15/18  Intake/Output from previous day: 11/08 0701 - 11/09 0700 In: 3053.4 [I.V.:1796.6; IV Piggyback:1251.8] Out: 1525 [Urine:1475; Drains:50] Intake/Output this shift: No intake/output data recorded.  PE: Gen:  Alert, NAD, cooperative HEENT: EOM's intact, pupils equal and round Pulm:  Rate and effort normal Abd: Soft, protuberant, focal tenderness around drain, no peritonitis, no HSM, no hernia, drain with trace bloody drainage in bag,  Psych: Alert, oriented to self and time Skin: no rashes noted, warm and dry  Lab Results:  Recent Labs    11/16/18 0400 11/17/18 0303  WBC 21.0* 11.7*  HGB 16.1 14.1  HCT 46.9 43.3  PLT 135* 111*   BMET Recent Labs    11/16/18 0400 11/17/18 0303  NA 133* 135  K 4.1 4.2  CL 96* 97*  CO2 26 29  GLUCOSE 108* 92  BUN 11 18  CREATININE 0.97 1.03  CALCIUM 9.0 8.5*   PT/INR Recent Labs    11/15/18 1026  LABPROT 13.2  INR 1.0   CMP     Component Value Date/Time   NA 135 11/17/2018 0303   K 4.2 11/17/2018 0303   CL 97 (L) 11/17/2018 0303   CO2 29 11/17/2018 0303   GLUCOSE 92 11/17/2018 0303   BUN 18 11/17/2018 0303   CREATININE 1.03 11/17/2018 0303   CALCIUM 8.5 (L) 11/17/2018 0303   PROT 5.4 (L) 11/17/2018 0303   PROT 6.3 12/04/2016 1029   ALBUMIN 2.3 (L) 11/17/2018 0303   ALBUMIN 4.0 12/04/2016 1029   AST 33  11/17/2018 0303   ALT 66 (H) 11/17/2018 0303   ALKPHOS 112 11/17/2018 0303   BILITOT 1.0 11/17/2018 0303   BILITOT 0.6 12/04/2016 1029   GFRNONAA >60 11/17/2018 0303   GFRAA >60 11/17/2018 0303   Lipase     Component Value Date/Time   LIPASE 23 11/15/2018 0555       Studies/Results: Ir Perc Cholecystostomy  Result Date: 11/16/2018 CLINICAL DATA:  Acute cholecystitis. Poor surgical candidate. Percutaneous drainage requested. EXAM: PERCUTANEOUS CHOLECYSTOSTOMY TUBE PLACEMENT WITH ULTRASOUND AND FLUOROSCOPIC GUIDANCE FLUOROSCOPY TIME:  1.8 minutes; 332 uGym2 DAP TECHNIQUE: The procedure, risks (including but not limited to bleeding, infection, organ damage ), benefits, and alternatives were explained to the patient. Questions regarding the procedure were encouraged and answered. The patient understands and consents to the procedure. Survey ultrasound of the abdomen was performed and an appropriate skin entry site was identified. Skin site was marked, prepped with chlorhexidine, and draped in usual sterile fashion, and infiltrated locally with 1% lidocaine. Intravenous Fentanyl 157mcg and Versed 2mg  were administered as conscious sedation during continuous monitoring of the patient's level of consciousness and physiological / cardiorespiratory status by the radiology RN, with a total moderate sedation time of 15 minutes. Under real-time ultrasound guidance, gallbladder was accessed using a transhepatic approach with a 21-gauge needle. Ultrasound image documentation was saved. Bile returned through  the hub. Needle was exchanged over a 018 guidewire for transitional dilator which allowed placement of 035 short Amplatz wire. Over this, a 10.2 French pigtail catheter was advanced and formed centrally in the gallbladder lumen. Small contrast injection confirmed appropriate position. Catheter secured externally with 0 Prolene suture and StatLock, and placed to external drain bag. Patient tolerated the  procedure well. COMPLICATIONS: COMPLICATIONS none IMPRESSION: 1. Technically successful percutaneous cholecystostomy tube placement with ultrasound and fluoroscopic guidance. Electronically Signed   By: Lucrezia Europe M.D.   On: 11/16/2018 12:18   Dg Chest Port 1 View  Result Date: 11/17/2018 CLINICAL DATA:  Respiratory distress EXAM: PORTABLE CHEST 1 VIEW COMPARISON:  11/15/2018 FINDINGS: Interval increase in heterogeneous airspace opacity at the right lung base, consistent with atelectasis or consolidation and a possible small right pleural effusion. Emphysema. The heart and mediastinum are normal. IMPRESSION: Interval increase in heterogeneous airspace opacity at the right lung base, consistent with atelectasis or consolidation and a possible small right pleural effusion. Electronically Signed   By: Eddie Candle M.D.   On: 11/17/2018 13:12   Dg Abd Portable 1v  Result Date: 11/17/2018 CLINICAL DATA:  Blood from cholecystostomy catheter after catheter was pulled, evaluate placement. Persistent bilious output. EXAM: PORTABLE ABDOMEN - 1 VIEW COMPARISON:  11/16/2018 FINDINGS: Cholecystostomy tube projects at the periphery of right upper abdomen, significantly displaced laterally from its initial placement radiograph. A few gas distended mid abdominal small bowel loops. Moderate proximal colonic fecal material without dilatation. 8 mm left renal calculus. Regional bones unremarkable. IMPRESSION: 1. Probable malposition of cholecystostomy catheter. We can  attempt repositioning under fluoroscopy. Electronically Signed   By: Lucrezia Europe M.D.   On: 11/17/2018 10:30    Anti-infectives: Anti-infectives (From admission, onward)   Start     Dose/Rate Route Frequency Ordered Stop   11/17/18 0930  vancomycin (VANCOCIN) 2,000 mg in sodium chloride 0.9 % 500 mL IVPB  Status:  Discontinued     2,000 mg 250 mL/hr over 120 Minutes Intravenous NOW 11/17/18 0836 11/17/18 0838   11/17/18 0930  vancomycin (VANCOCIN) 2,250  mg in sodium chloride 0.9 % 500 mL IVPB  Status:  Discontinued     2,250 mg 250 mL/hr over 120 Minutes Intravenous Every 24 hours 11/17/18 0838 11/18/18 0802   11/16/18 1730  Ampicillin-Sulbactam (UNASYN) 3 g in sodium chloride 0.9 % 100 mL IVPB     3 g 200 mL/hr over 30 Minutes Intravenous Every 6 hours 11/16/18 1727     11/15/18 1100  piperacillin-tazobactam (ZOSYN) IVPB 3.375 g  Status:  Discontinued     3.375 g 12.5 mL/hr over 240 Minutes Intravenous Every 8 hours 11/15/18 1022 11/16/18 1727   11/15/18 0845  cefTRIAXone (ROCEPHIN) 2 g in sodium chloride 0.9 % 100 mL IVPB     2 g 200 mL/hr over 30 Minutes Intravenous  Once 11/15/18 0836 11/15/18 0934       Assessment/Plan CAD, Hx of MI s/p stent - previously on Brilinta (reports he is no longer taking) Remote alcohol abuse Hypoxia  Altered mental status  Acute Cholecystitis - s/p perc chole tube 11/7, cultures pending - patient became confused/agitated yesterday and pulled perc chole tube out of placed - IR to attempt replacement   FEN -NPO VTE -SCDs ID -Rocephin x 1 in ED. Zosyn 11/6 >>11/7, unasyn 11/7>>, vancomycin 11/8>>   LOS: 3 days    Wellington [place], [facility] 11/18/2018, 8:03 AM Please see Amion for pager number during day  hours 7:00am-4:30pm

## 2018-11-18 NOTE — Plan of Care (Signed)
TRH to pick up on 11/10.  See [place] communication.

## 2018-11-18 NOTE — Progress Notes (Addendum)
NAME:  David Hendricks, MRN:  [id number], DOB:  March 09, 1950, LOS: 3 ADMISSION DATE:  11/15/2018, CONSULTATION DATE: 11/17/2018 REFERRING MD:  Dr. Junious Dresser, CHIEF COMPLAINT: Altered mental status  Brief History   This is a 68 year old gentleman with past medical history of coronary artery disease status post PCI, history of remote alcohol use and recurrent abdominal pain found to have evidence of cholecystitis underwent percutaneous cholecystostomy tube by interventional radiology on 11/16/2018.  Patient was given additional doses of Ativan and morphine overnight and was found to have altered mental status this morning and transferred from regular medical floor to progressive care unit.  Pulmonary was consulted for recommendations on potential transfer to the intensive care unit  History of present illness   68 year old gentleman past medical history of coronary artery disease status post PCI, history of alcohol use found to have cholecystitis underwent right-sided cholecystostomy tube for drainage by interventional radiology on 11/16/2018.  Overnight became progressively obtunded and the decision was made for transfer to the PCU floor.  He had received multiple doses of Ativan overnight due to concern for worsening withdrawal symptoms.  He had also received fentanyl and Versed periprocedural yesterday.  Was given a oral dose of clonidine this morning as well as IV morphine.  Patient also receiving 6 mg of Decadron daily. I believe this is the biggest reason for his change in mental state.  On exam on the floor.  Patient is arousable to voice.  He is able to answer some basic commands and tell us his name.  He did have an arterial blood gas which did not show CO2 retention however there was hypoxemia.  He is currently on a open facemask.  Rapid response nurse was called.  And posterior oropharyngeal suctioning revealed remnants of his previous breakfast tray.  Past Medical History   Past Medical History:   Diagnosis Date  . Anxiety   . Arthritis   . Coronary artery disease    10/18 PCI/DES to pLAD, mild nonobstructive disease in the Lcx/RCA. Normal EF.   Marland Kitchen Dysrhythmia 2016   irregular heartbeat  . Myocardial infarction ([place])   . Seasonal allergies      Significant Hospital Events   11/17/2018: Rapid response for altered mental status and hypoxemia  Consults:  Interventional radiology General surgery  Procedures:  11/16/2018 percutaneous cholecystostomy tube 11/16/2018: Gallbladder aspiration negative no growth to date  Significant Diagnostic Tests:  11/15/2018 CT chest: IMPRESSION: 1. No pulmonary embolism. 2. Mild gallbladder wall thickening and pericholecystic fat stranding in the visualized upper abdomen, compatible with reported acute cholecystitis on abdominal sonogram study from earlier today. 3. Scattered solid pulmonary nodules, largest 7 mm in the lingula. Non-contrast chest CT at 3-6 months is recommended. If the nodules are stable at time of repeat CT, then future CT at 18-24 months (from today's scan) is considered optional for low-risk patients, but is recommended for high-risk patients. This recommendation follows the consensus statement: Guidelines for Management of Incidental Pulmonary Nodules Detected on CT Images: From the Fleischner Society 2017; Radiology 2017; 284:228-243. 4. Nonspecific circumferential wall thickening in the lower thoracic esophagus. While most commonly due to reflux esophagitis, Barrett's esophagus or esophageal neoplasm cannot be excluded by CT. GI consultation suggested. 5. Prominent goiter, asymmetric to the left. Thyroid ultrasound evaluation suggested on a short term outpatient basis.  Micro Data:  11/15/2018: [id number] negative 11/16/2018: Gallbladder pending  Antimicrobials:  Zosyn 11/6>11/7 Unasyn 11/7> Vanc 11/8>stop  Interim history/subjective:  Transferred to ICU yesterday for agitation requiring sedation.  Started on  Precedex gtt for possible EtOH withdrawal. However overnight patient and family discussions with RN report that patient is not active drinker.  Objective   Blood pressure (!) 104/50, pulse 66, temperature 97.8 F (36.6 C), temperature source Axillary, resp. rate 12, height 6' (1.829 m), weight 93.5 kg, SpO2 99 %.    FiO2 (%):  [50 %] 50 %   Intake/Output Summary (Last 24 hours) at 11/18/2018 0747 Last data filed at 11/18/2018 0700 Gross per 24 hour  Intake 3053.35 ml  Output 1525 ml  Net 1528.35 ml   Filed Weights   11/15/18 0940 11/15/18 2035  Weight: 95.2 kg 93.5 kg   Physical Exam: General: Chronically ill-appearing, awake and oriented to self only HENT: [place], AT, OP clear, MMM Eyes: EOMI, no scleral icterus Respiratory: Clear to auscultation bilaterally.  No crackles, wheezing or rales Cardiovascular: RRR, -M/R/G, no JVD GI: BS+, soft, RUQ tenderness, RUQ percutaneous drain Extremities:-Edema,-tenderness Neuro: Awake, alert oriented to self, CNII-XII grossly intact, moves extremities x 4 GU: Condom cath in place  Resolved Hospital Problem list     Assessment & Plan:   Acute hypoxemic respiratory failure secondary hypoventilation vs aspiration pneumonia/pneumonitis: Presume this to be from hypoventilation and atelectasis in the setting of oversedation from multiple medications.   Altered mental status secondary to oversedation +/- delirium: In the past less than 24 hours has received fentanyl, Versed, Ativan, morphine, clonidine, trazodone, Decadron. He does have a history of alcohol use however he has no current autonomic or sympathetic responses that are consistent with active alcohol withdrawal. Plan: Minimize sedating medications Continue unasyn for possible aspiration pneumonia Continue multivitamin, thamine  Sepsis, cholecystitis: elevated alk phos, transaminitis, elevated bilirubin Plan: Dislodged percutaneous drain to be corrected by IR today  Continue unasyn  Follow-up cultures  Emphysema on CT: 90-pack-year history of smoking, baseline CO2 retention from arterial blood gas analysis, compensated chronic respiratory acidosis, likely underlying COPD no PFTs on file.  Nocturnal hypoxemia on previous split-night sleep study. Plan Continue Pulmicort and scheduled Duonebs Flutter and chest physiotherapy Nocturnal BiPAP when alert  Multiple pulmonary nodules: including largest measured 47m in lingula. This will need to be followed up as an outpatient. Repeat CT imaging per Fleischner criteria  Circumferential distal esophageal thickening Likely need GI evaluation  Best practice:  Diet: NPO Pain/Anxiety/Delirium protocol (if indicated): NA VAP protocol (if indicated): NA DVT prophylaxis: SCDs GI prophylaxis: N/A Glucose control: SSI Mobility:As tolerated Code Status: full Family Communication:  Updated sister 11/9 Disposition: ICU   Labs   CBC: Recent Labs  Lab 11/12/18 0240 11/13/18 0934 11/15/18 0555 11/16/18 0400 11/17/18 0303  WBC 15.4* 14.8* 19.5* 21.0* 11.7*  NEUTROABS 11.9* 10.9*  --   --   --   HGB 17.6* 17.2* 17.5* 16.1 14.1  HCT 53.2* 51.6 51.3 46.9 43.3  MCV 85.4 85.7 83.6 83.9 86.8  PLT 155 147* 132* 135* 111*    Basic Metabolic Panel: Recent Labs  Lab 11/12/18 0240 11/13/18 0934 11/15/18 0555 11/16/18 0400 11/17/18 0303  NA 140 135 132* 133* 135  K 3.9 3.7 3.6 4.1 4.2  CL 102 100 94* 96* 97*  CO2 _0 GLUCOSE 115* 114* 141* 108* 92  BUN _1 CREATININE 1.01 0.98 0.97 0.97 1.03  CALCIUM 9.2 8.9 9.2 9.0 8.5*  MG  --   --   --   --  2.4  PHOS  --   --   --   --  4.6   GFR: Estimated Creatinine Clearance: 80.4 mL/min (by C-G formula based on SCr of 1.03 mg/dL). Recent Labs  Lab 11/13/18 0934 11/15/18 0555 11/16/18 0400 11/17/18 0303  WBC 14.8* 19.5* 21.0* 11.7*    Liver Function Tests: Recent Labs  Lab 11/12/18 0240 11/13/18 0934 11/15/18 0555 11/16/18 0400 11/17/18  0303  AST 19 17 57* 96* 33  ALT 17 16 67* 111* 66*  ALKPHOS 100 100 115 150* 112  BILITOT 0.5 1.3* 2.5* 3.6* 1.0  PROT 7.0 7.0 6.6 6.2* 5.4*  ALBUMIN 4.0 4.1 3.4* 2.8* 2.3*   Recent Labs  Lab 11/12/18 0240 11/13/18 0934 11/15/18 0555  LIPASE 47 34 23   No results for input(s): AMMONIA in the last 168 hours.  ABG    Component Value Date/Time   PHART 7.370 11/17/2018 0908   PCO2ART 54.0 (H) 11/17/2018 0908   PO2ART 55.3 (L) 11/17/2018 0908   HCO3 30.2 (H) 11/17/2018 0908   TCO2 28 04/02/2017 1527   O2SAT 87.5 11/17/2018 0908     Coagulation Profile: Recent Labs  Lab 11/15/18 1026  INR 1.0    Cardiac Enzymes: No results for input(s): CKTOTAL, CKMB, CKMBINDEX, TROPONINI in the last 168 hours.  HbA1C: Hgb A1c MFr Bld  Date/Time Value Ref Range Status  05/03/2018 12:01 PM 5.5 4.6 - 6.5 % Final    Comment:    Glycemic Control Guidelines for People with Diabetes:Non Diabetic:  <6%Goal of Therapy: <7%Additional Action Suggested:  >8%   10/09/2016 08:09 PM 5.1 4.8 - 5.6 % Final    Comment:    (NOTE) Pre diabetes:          5.7%-6.4% Diabetes:              >6.4% Glycemic control for   <7.0% adults with diabetes     CBG: Recent Labs  Lab 11/17/18 1620  GLUCAP 130*    The patient is critically ill with multiple organ systems failure and requires high complexity decision making for assessment and support, frequent evaluation and titration of therapies, application of advanced monitoring technologies and extensive interpretation of multiple databases.   Critical Care Time devoted to patient care services described in this note is 32 Minutes.   Rodman Pickle, M.D. [facility] Pulmonary/Critical Care Medicine 11/18/2018 7:47 AM  Pager: [phone] After hours pager: [phone]

## 2018-11-19 ENCOUNTER — Telehealth: Payer: Self-pay

## 2018-11-19 ENCOUNTER — Other Ambulatory Visit: Payer: Self-pay

## 2018-11-19 ENCOUNTER — Emergency Department (HOSPITAL_COMMUNITY): Payer: Medicare Other

## 2018-11-19 ENCOUNTER — Inpatient Hospital Stay (HOSPITAL_COMMUNITY)
Admission: EM | Admit: 2018-11-19 | Discharge: 2018-11-22 | Disposition: A | Discharge status: Home / Self Care | Payer: Medicare Other | Source: Home / Self Care | Attending: Internal Medicine | Admitting: Internal Medicine

## 2018-11-19 ENCOUNTER — Encounter (HOSPITAL_COMMUNITY): Payer: Self-pay | Admitting: Emergency Medicine

## 2018-11-19 DIAGNOSIS — J189 Pneumonia, unspecified organism: Secondary | ICD-10-CM | POA: Diagnosis present

## 2018-11-19 DIAGNOSIS — R1011 Right upper quadrant pain: Secondary | ICD-10-CM

## 2018-11-19 DIAGNOSIS — Z720 Tobacco use: Secondary | ICD-10-CM | POA: Diagnosis present

## 2018-11-19 DIAGNOSIS — E876 Hypokalemia: Secondary | ICD-10-CM

## 2018-11-19 DIAGNOSIS — R109 Unspecified abdominal pain: Secondary | ICD-10-CM | POA: Diagnosis present

## 2018-11-19 DIAGNOSIS — N400 Enlarged prostate without lower urinary tract symptoms: Secondary | ICD-10-CM | POA: Diagnosis present

## 2018-11-19 LAB — COMPREHENSIVE METABOLIC PANEL
ALT: 47 U/L — ABNORMAL HIGH (ref 0–44)
AST: 38 U/L (ref 15–41)
Albumin: 3.2 g/dL — ABNORMAL LOW (ref 3.5–5.0)
Alkaline Phosphatase: 92 U/L (ref 38–126)
Anion gap: 8 (ref 5–15)
BUN: 16 mg/dL (ref 8–23)
CO2: 27 mmol/L (ref 22–32)
Calcium: 8.3 mg/dL — ABNORMAL LOW (ref 8.9–10.3)
Chloride: 102 mmol/L (ref 98–111)
Creatinine, Ser: 0.85 mg/dL (ref 0.61–1.24)
GFR calc Af Amer: >60 mL/min (ref >60)
GFR calc non Af Amer: >60 mL/min (ref >60)
Glucose, Bld: 97 mg/dL (ref 70–99)
Potassium: 3.4 mmol/L — ABNORMAL LOW (ref 3.5–5.1)
Sodium: 137 mmol/L (ref 135–145)
Total Bilirubin: 1.2 mg/dL (ref 0.3–1.2)
Total Protein: 6.1 g/dL — ABNORMAL LOW (ref 6.5–8.1)

## 2018-11-19 LAB — CBC
HCT: 44.3 % (ref 39.0–52.0)
Hemoglobin: 14.8 g/dL (ref 13.0–17.0)
MCH: 28.6 pg (ref 26.0–34.0)
MCHC: 33.4 g/dL (ref 30.0–36.0)
MCV: 85.7 fL (ref 80.0–100.0)
Platelets: 170 10*3/uL (ref 150–400)
RBC: 5.17 MIL/uL (ref 4.22–5.81)
RDW: 12.5 % (ref 11.5–15.5)
WBC: 9.1 10*3/uL (ref 4.0–10.5)
nRBC: 0 % (ref 0.0–0.2)

## 2018-11-19 LAB — URINALYSIS, ROUTINE W REFLEX MICROSCOPIC
Bilirubin Urine: NEGATIVE
Glucose, UA: NEGATIVE mg/dL
Hgb urine dipstick: NEGATIVE
Ketones, ur: NEGATIVE mg/dL
Leukocytes,Ua: NEGATIVE
Nitrite: NEGATIVE
Protein, ur: NEGATIVE mg/dL
Specific Gravity, Urine: 1.013 (ref 1.005–1.030)
pH: 7 (ref 5.0–8.0)

## 2018-11-19 LAB — LIPASE, BLOOD: Lipase: 40 U/L (ref 11–51)

## 2018-11-19 MED ORDER — ACETAMINOPHEN 325 MG PO TABS
650.0000 mg | ORAL_TABLET | Freq: Four times a day (QID) | ORAL | Status: DC | PRN
  Administered 2018-11-20 – 2018-11-22 (×3): 650 mg via ORAL
  Filled 2018-11-19 (×3): qty 2

## 2018-11-19 MED ORDER — PIPERACILLIN-TAZOBACTAM 3.375 G IVPB
3.3750 g | Freq: Three times a day (TID) | INTRAVENOUS | Status: DC
  Administered 2018-11-20 – 2018-11-22 (×8): 3.375 g via INTRAVENOUS
  Filled 2018-11-19 (×7): qty 50

## 2018-11-19 MED ORDER — LACTATED RINGERS IV SOLN
INTRAVENOUS | Status: AC
  Administered 2018-11-19: 21:00:00 via INTRAVENOUS

## 2018-11-19 MED ORDER — PIPERACILLIN-TAZOBACTAM 3.375 G IVPB 30 MIN
3.3750 g | Freq: Once | INTRAVENOUS | Status: AC
  Administered 2018-11-19: 3.375 g via INTRAVENOUS
  Filled 2018-11-19: qty 50

## 2018-11-19 MED ORDER — NICOTINE 21 MG/24HR TD PT24
21.0000 mg | MEDICATED_PATCH | Freq: Every day | TRANSDERMAL | Status: DC
  Administered 2018-11-19 – 2018-11-21 (×3): 21 mg via TRANSDERMAL
  Filled 2018-11-19 (×3): qty 1

## 2018-11-19 MED ORDER — HYDROMORPHONE HCL 1 MG/ML IJ SOLN
0.5000 mg | INTRAMUSCULAR | Status: DC | PRN
  Administered 2018-11-22: 0.5 mg via INTRAVENOUS
  Filled 2018-11-19: qty 0.5

## 2018-11-19 MED ORDER — POTASSIUM CHLORIDE 10 MEQ/100ML IV SOLN
10.0000 meq | INTRAVENOUS | Status: AC
  Administered 2018-11-19 (×2): 10 meq via INTRAVENOUS
  Filled 2018-11-19 (×2): qty 100

## 2018-11-19 MED ORDER — ZOLPIDEM TARTRATE 5 MG PO TABS
5.0000 mg | ORAL_TABLET | Freq: Once | ORAL | Status: AC
  Administered 2018-11-19: 5 mg via ORAL
  Filled 2018-11-19: qty 1

## 2018-11-19 MED ORDER — ONDANSETRON HCL 4 MG/2ML IJ SOLN: 4.0000 mg | Freq: Four times a day (QID) | INTRAMUSCULAR | Status: DC | PRN

## 2018-11-19 MED ORDER — VANCOMYCIN HCL IN DEXTROSE 1-5 GM/200ML-% IV SOLN
1000.0000 mg | Freq: Once | INTRAVENOUS | Status: AC
  Administered 2018-11-19: 1000 mg via INTRAVENOUS
  Filled 2018-11-19: qty 200

## 2018-11-19 MED ORDER — ACETAMINOPHEN 650 MG RE SUPP: 650.0000 mg | Freq: Four times a day (QID) | RECTAL | Status: DC | PRN

## 2018-11-19 MED ORDER — VANCOMYCIN HCL 10 G IV SOLR
1250.0000 mg | Freq: Two times a day (BID) | INTRAVENOUS | Status: DC
  Administered 2018-11-20: 1250 mg via INTRAVENOUS
  Filled 2018-11-19: qty 1250

## 2018-11-19 MED ORDER — ALBUTEROL SULFATE (2.5 MG/3ML) 0.083% IN NEBU: 2.5000 mg | INHALATION_SOLUTION | RESPIRATORY_TRACT | Status: DC | PRN

## 2018-11-19 MED ORDER — HALOPERIDOL LACTATE 5 MG/ML IJ SOLN: 2.5000 mg | Freq: Once | INTRAMUSCULAR | Status: DC

## 2018-11-19 MED ORDER — SODIUM CHLORIDE 0.9% FLUSH
3.0000 mL | Freq: Two times a day (BID) | INTRAVENOUS | Status: DC
  Administered 2018-11-19 – 2018-11-21 (×2): 3 mL via INTRAVENOUS

## 2018-11-19 NOTE — Telephone Encounter (Signed)
Copied from [place] [id number]. Topic: General - Other >> Nov 19, 2018  1:33 PM Carolyn Stare wrote: Pt sister in law call to say checked his self out of ICU at [facility] hospital against the hosp wishes. She is asking for a call back asking for an antibiotic that he was taking in the hosp, I did advised her he needed an appt to get an antibiotic

## 2018-11-19 NOTE — Progress Notes (Signed)
Pt again requesting to leave AMA. AMA paperwork signed. All belongings returned to patient upon departure.

## 2018-11-19 NOTE — Progress Notes (Signed)
CSW received call from patient's daughter, Azariel Paynter stating patient is really in need of help. She reports patient left [name] ICU AMA early this morning, went home, started feeling bad and came here to [facility] ED. She reports patient stated to her that he did not like [place]. [name] reports patient lives in [place] and patient does not have any family here in the area. [name] stated she wanted to make someone aware and if there are any problems with patient to give her a call [phone]). CSW made Change RN aware of the call with patient's daughter.  [place]en Circle, LCSW Transitions of Care Department [facility] ED [phone]

## 2018-11-19 NOTE — Progress Notes (Signed)
[facility] Progress Note Patient Name: David Hendricks DOB: 11-28-1950 MRN: [id number]   Date of Service  11/19/2018  HPI/Events of Note  Notified that RN hs given PRN Haldol for agitation.   eICU Interventions  Will order: 1. Monitor QTc interval now and Q 6 hours. Notify MD if QTc interval > 500 milliseconds.     Intervention Category Major Interventions: Delirium, psychosis, severe agitation - evaluation and management  Sommer,Steven Eugene 11/19/2018, 12:05 AM

## 2018-11-19 NOTE — Telephone Encounter (Signed)
Spoke with pt sis in law and advise her he needs to go back to the hospital and remain there for care. She verbalized understanding and will try to get him back.

## 2018-11-19 NOTE — ED Triage Notes (Signed)
Per EMS, patient from home, c/o LLQ abdominal pain with SOB. Put on 3L [place] with EMS. Denies N/V/D. Seen for same this weekend. Ambulatory.

## 2018-11-19 NOTE — Telephone Encounter (Signed)
I would strongly advise him to return to the hospital.  He was on IV antibiotics which I am unable to provide. He needs continued monitoring for his illness.  He is at high risk of becoming seriously ill without continued hospital care.

## 2018-11-19 NOTE — ED Notes (Signed)
ED TO INPATIENT HANDOFF REPORT  ED Nurse Name and Phone #: [phone]  S Name/Age/Gender David Hendricks 68 y.o. male Room/Bed: WA03/WA03  Code Status   Code Status: Prior  Home/SNF/Other Home Patient oriented to: self, place, time and situation Is this baseline? Yes   Triage Complete: Triage complete  Chief Complaint SOB, Weakness, Abd Pain  Triage Note Per EMS, patient from home, c/o LLQ abdominal pain with SOB. Put on 3L [employer] with EMS. Denies N/V/D. Seen for same this weekend. Ambulatory.   Allergies No Known Allergies  Level of Care/Admitting Diagnosis ED Disposition    ED Disposition Condition Comment   Admit  Hospital Area: [facility] [id number]  Level of Care: Telemetry [5]  Admit to tele based on following criteria: Other see comments  Comments: mild hypoxia  Covid Evaluation: Confirmed COVID Negative  Diagnosis: HCAP (healthcare-associated pneumonia) [id number]  Admitting Physician: Rhetta Mura [id number]  Attending Physician: Rhetta Mura [id number]  Estimated length of stay: past midnight tomorrow  Certification:: I certify this patient will need inpatient services for at least 2 midnights  PT Class (Do Not Modify): Inpatient [101]  PT Acc Code (Do Not Modify): Private [1]       B Medical/Surgery History Past Medical History:  Diagnosis Date  . Anxiety   . Arthritis   . Coronary artery disease    10/18 PCI/DES to pLAD, mild nonobstructive disease in the Lcx/RCA. Normal EF.   [place] Dysrhythmia 2016   irregular heartbeat  . Myocardial infarction ([place])   . Seasonal allergies    Past Surgical History:  Procedure Laterality Date  . CORONARY ANGIOPLASTY  10/09/2016  . CORONARY STENT INTERVENTION N/A 10/09/2016   Procedure: CORONARY STENT INTERVENTION;  Surgeon: Nelva Bush, MD;  Location: [facility];  Service: Cardiovascular;  Laterality: N/A;  . INTRAVASCULAR ULTRASOUND/IVUS N/A 10/09/2016   Procedure: Intravascular  Ultrasound/IVUS;  Surgeon: Nelva Bush, MD;  Location: [facility];  Service: Cardiovascular;  Laterality: N/A;  . IR CHOLANGIOGRAM EXISTING TUBE  11/18/2018  . IR PERC CHOLECYSTOSTOMY  11/16/2018  . LEFT HEART CATH AND CORONARY ANGIOGRAPHY N/A 10/09/2016   Procedure: LEFT HEART CATH AND CORONARY ANGIOGRAPHY;  Surgeon: Nelva Bush, MD;  Location: [facility];  Service: Cardiovascular;  Laterality: N/A;     A IV Location/Drains/Wounds Patient Lines/Drains/Airways Status   Active Line/Drains/Airways    Name:   Placement date:   Placement time:   Site:   Days:   Peripheral IV 11/19/18 Left Hand   11/19/18    1805    Hand   less than 1          Intake/Output Last 24 hours No intake or output data in the 24 hours ending 11/19/18 1855  Labs/Imaging Results for orders placed or performed during the hospital encounter of 11/19/18 (from the past 48 hour(s))  Lipase, blood     Status: None   Collection Time: 11/19/18  4:08 PM  Result Value Ref Range   Lipase 40 11 - 51 U/L    Comment: Performed at [facility], [place] [address], [place], [place] [address]  Comprehensive metabolic panel     Status: Abnormal   Collection Time: 11/19/18  4:08 PM  Result Value Ref Range   Sodium 137 135 - 145 mmol/L   Potassium 3.4 (L) 3.5 - 5.1 mmol/L   Chloride 102 98 - 111 mmol/L   CO2 27 22 - 32 mmol/L   Glucose, Bld 97  70 - 99 mg/dL   BUN 16 8 - 23 mg/dL   Creatinine, Ser 0.85 0.61 - 1.24 mg/dL   Calcium 8.3 (L) 8.9 - 10.3 mg/dL   Total Protein 6.1 (L) 6.5 - 8.1 g/dL   Albumin 3.2 (L) 3.5 - 5.0 g/dL   AST 38 15 - 41 U/L   ALT 47 (H) 0 - 44 U/L   Alkaline Phosphatase 92 38 - 126 U/L   Total Bilirubin 1.2 0.3 - 1.2 mg/dL   GFR calc non Af Amer >60 >60 mL/min   GFR calc Af Amer >60 >60 mL/min   Anion gap 8 5 - 15    Comment: Performed at [facility], [place] [address] [place], [place] [address]  CBC     Status: None   Collection Time:  11/19/18  4:08 PM  Result Value Ref Range   WBC 9.1 4.0 - 10.5 K/uL   RBC 5.17 4.22 - 5.81 MIL/uL   Hemoglobin 14.8 13.0 - 17.0 g/dL   HCT 44.3 39.0 - 52.0 %   MCV 85.7 80.0 - 100.0 fL   MCH 28.6 26.0 - 34.0 pg   MCHC 33.4 30.0 - 36.0 g/dL   RDW 12.5 11.5 - 15.5 %   Platelets 170 150 - 400 K/uL   nRBC 0.0 0.0 - 0.2 %    Comment: Performed at [facility], [place] [address] [place], [place] [address]   Dg Chest 2 View  Result Date: 11/19/2018 CLINICAL DATA:  Shortness of breath. EXAM: CHEST - 2 VIEW COMPARISON:  11/15/2018 FINDINGS: The cardiac silhouette, mediastinal and hilar contours are within normal limits and stable. Chronic bronchitic changes. There is a small right pleural effusion. IMPRESSION: Chronic bronchitic changes and small right effusion Electronically Signed   By: Marijo Sanes M.D.   On: 11/19/2018 16:29    Pending Labs Unresulted Labs (From admission, onward)    Start     Ordered   11/19/18 1517  Urinalysis, Routine w reflex microscopic  ONCE - STAT,   STAT     11/19/18 1516          Vitals/Pain Today's Vitals   11/19/18 1515 11/19/18 1800 11/19/18 1804  BP: (!) 153/79 136/73   Pulse: 91 90   Resp: (!) 22 (!) 25   Temp: 98.2 F (36.8 C)    TempSrc: Oral    SpO2: 96% 96%   PainSc:   0-No pain    Isolation Precautions No active isolations  Medications Medications  vancomycin (VANCOCIN) IVPB 1000 mg/200 mL premix (has no administration in time range)  piperacillin-tazobactam (ZOSYN) IVPB 3.375 g (3.375 g Intravenous New Bag/Given 11/19/18 1814)    Mobility walks Low fall risk   Focused Assessments GI   R Recommendations: See Admitting Provider Note  Report given to:   Additional Notes:  N/A

## 2018-11-19 NOTE — H&P (Signed)
History and Physical    PLEASE NOTE THAT DRAGON DICTATION SOFTWARE WAS USED IN THE CONSTRUCTION OF THIS NOTE.   David Hendricks EXN:[id number] DOB: [birth year] DOA: 11/19/2018  PCP: [name], DO Patient coming from: Home  I have personally briefly reviewed patient's old medical records in [place]  Chief Complaint: Shortness of breath  HPI: David Hendricks is a 68 y.o. male with medical history significant for coronary artery disease status post drug-eluting stent to proximal LAD in October 2018, BPH, chronic tobacco abuse, who is admitted to [facility] long hospital on 11/19/2018 with HCAP pna after presenting from home to [facility] long emergency department complaining of shortness of breath.  The following history is obtained via my discussions with the patient as well as my discussions with the emergency department physician, and via chart review.  The patient was recently admitted to [facility] on 11/15/2018 for suspected acute cholecystitis after presenting with right upper quadrant abdominal discomfort associated with nausea/vomiting.  On 11/17/2018 it appears that the patient underwent percutaneous cholecystostomy tube placement via interventional radiology, with ensuing cholangiogram demonstrating no abnormalities.  Subsequently, on 11/18/2018, he underwent removal of percutaneous cholecystostomy tube without complication.  Hospital course was complicated by development of shortness of breath on 11/17/2018, prompting chest x-ray which showed interval development of right lower lobe airspace opacity concerning for pneumonia.  Meeting criteria to be considered hospital associated in nature, the patient was started on IV vancomycin as well as Unasyn.  In the setting of a negative MRSA PCR finding, IV vancomycin was discontinued on 11/18/2018.  On 11/18/2018, the patient left [facility].    In response to multiple calls from [place] worker encouraging the  patient to return to the hospital for further evaluation and management, the patient presents to [facility] long emergency department this evening.  He reports persistence of the shortness of breath that he was experiencing over the final 2 days of his hospitalization at [facility] before leaving AMA.  He notes that this is associated with a nonproductive cough as well as subjective fever.  He denies any associated chest pain, palpitations, diaphoresis.  Denies any associated orthopnea, PND, or peripheral edema.  Denies headache, neck stiffness, rash, dysuria, or urinary urgency/frequency.  He reports mild residual right upper quadrant abdominal discomfort and denies any nausea/vomiting over the last 24 hours.  Over that time, he reports having consumed multiple helpings of soup, without any exacerbation of his abdominal discomfort nor any induction of nausea/vomiting.  Denies any associated diarrhea.   ED Course: Vital signs in the emergency department this evening were notable for the following: Temperature max 98.2; heart rate 80-91; blood pressure ranged from 136/73-150 3/79; respiratory rate 20-25; initial oxygen saturation noted to be 88% on room air, which is improved to 96% on 2 to 3 L nasal cannula.  Labs in the ED today were notable for the following: CMP notable for potassium 3.4, bicarbonate 27, creatinine 0.85, alkaline phosphatase 92, AST 38, ALT 47, total bilirubin 1.2.  Lipase 40.  CBC notable for white blood cell count of 9100, hemoglobin 14.8.  Urinalysis was ordered, with result pending at this time.  Repeat chest x-ray performed today showed a small right pleural effusion as well as findings consistent with chronic bronchitis, but no evidence of acute cardiopulmonary process relative to plain films of the chest performed on 11/17/2018.  While in the ED this evening, the following was administered: Zosyn 3.375 g IV x1 as  well as a dose of IV vancomycin.    Review of Systems: As per HPI  otherwise 10 point review of systems negative.   Past Medical History:  Diagnosis Date   Anxiety    Arthritis    Coronary artery disease    10/18 PCI/DES to pLAD, mild nonobstructive disease in the Lcx/RCA. Normal EF.    Dysrhythmia 2016   irregular heartbeat   Myocardial infarction ([place])    Seasonal allergies     Past Surgical History:  Procedure Laterality Date   CORONARY ANGIOPLASTY  10/09/2016   CORONARY STENT INTERVENTION N/A 10/09/2016   Procedure: CORONARY STENT INTERVENTION;  Surgeon: Nelva Bush, MD;  Location: [facility];  Service: Cardiovascular;  Laterality: N/A;   INTRAVASCULAR ULTRASOUND/IVUS N/A 10/09/2016   Procedure: Intravascular Ultrasound/IVUS;  Surgeon: Nelva Bush, MD;  Location: [facility];  Service: Cardiovascular;  Laterality: N/A;   IR CHOLANGIOGRAM EXISTING TUBE  11/18/2018   IR PERC CHOLECYSTOSTOMY  11/16/2018   LEFT HEART CATH AND CORONARY ANGIOGRAPHY N/A 10/09/2016   Procedure: LEFT HEART CATH AND CORONARY ANGIOGRAPHY;  Surgeon: Nelva Bush, MD;  Location: [facility];  Service: Cardiovascular;  Laterality: N/A;    Social History:  reports that he has been smoking cigarettes. He started smoking about 57 years ago. He has a 90.00 pack-year smoking history. He has never used smokeless tobacco. He reports that he does not drink alcohol or use drugs.   No Known Allergies  Family History  Problem Relation Age of Onset   Cancer Father     Prior to Admission medications   Medication Sig Start Date End Date Taking? Authorizing Provider  methocarbamol (ROBAXIN) 500 MG tablet Take 1 tablet (500 mg total) by mouth 2 (two) times daily. Patient taking differently: Take 500 mg by mouth 2 (two) times daily as needed for muscle spasms.  09/17/18  Yes Lacretia Leigh, MD  tamsulosin (FLOMAX) 0.4 MG CAPS capsule Take 0.8 mg by mouth daily.   Yes [provider]  traMADol (ULTRAM) 50 MG tablet Take 1 tablet (50  mg total) by mouth every 12 (twelve) hours as needed for up to 5 days. 11/14/18 11/19/18 Yes [name], DO  traZODone (DESYREL) 100 MG tablet Take 0.5-1 tablets (50-100 mg total) by mouth at bedtime as needed for sleep. 06/21/17  Yes [name], DO    Objective    Physical Exam: Vitals:   11/19/18 1515 11/19/18 1800  BP: (!) 153/79 136/73  Pulse: 91   Resp: (!) 22 (!) 25  Temp: 98.2 F (36.8 C)   TempSrc: Oral   SpO2: 96%     General: appears to be stated age; alert, oriented Skin: warm, dry; healing surgical wound over RUQ, without any associated drainage. Head:  AT/[place] Eyes:  PEARL b/l, EOMI Mouth:  Oral mucosa membranes appear dry, normal dentition Neck: supple; trachea midline Heart:  RRR; did not appreciate any M/R/G Lungs: Diminished bibasilar breath sounds in the absence of any associated wheezes or rales. Abdomen: + BS; soft, ND; mild tenderness over the right upper quadrant in the absence of any associated guarding, rigidity, or rebound tenderness. Vascular: 2+ pedal pulses b/l; 2+ radial pulses b/l Extremities: no peripheral edema, no muscle wasting   Labs on Admission: I have personally reviewed following labs and imaging studies  CBC: Recent Labs  Lab 11/13/18 0934 11/15/18 0555 11/16/18 0400 11/17/18 0303 11/19/18 1608  WBC 14.8* 19.5* 21.0* 11.7* 9.1  NEUTROABS 10.9*  --   --   --   --  HGB 17.2* 17.5* 16.1 14.1 14.8  HCT 51.6 51.3 46.9 43.3 44.3  MCV 85.7 83.6 83.9 86.8 85.7  PLT 147* 132* 135* 111* 834   Basic Metabolic Panel: Recent Labs  Lab 11/13/18 0934 11/15/18 0555 11/16/18 0400 11/17/18 0303 11/19/18 1608  NA 135 132* 133* 135 137  K 3.7 3.6 4.1 4.2 3.4*  CL 100 94* 96* 97* 102  CO2 '28 27 26 29 27  ' GLUCOSE 114* 141* 108* 92 97  BUN '11 12 11 18 16  ' CREATININE 0.98 0.97 0.97 1.03 0.85  CALCIUM 8.9 9.2 9.0 8.5* 8.3*  MG  --   --   --  2.4  --   PHOS  --   --   --  4.6  --    GFR: Estimated Creatinine Clearance: 97.5  mL/min (by C-G formula based on SCr of 0.85 mg/dL). Liver Function Tests: Recent Labs  Lab 11/13/18 0934 11/15/18 0555 11/16/18 0400 11/17/18 0303 11/19/18 1608  AST 17 57* 96* 33 38  ALT 16 67* 111* 66* 47*  ALKPHOS 100 115 150* 112 92  BILITOT 1.3* 2.5* 3.6* 1.0 1.2  PROT 7.0 6.6 6.2* 5.4* 6.1*  ALBUMIN 4.1 3.4* 2.8* 2.3* 3.2*   Recent Labs  Lab 11/13/18 0934 11/15/18 0555 11/19/18 1608  LIPASE 34 23 40   No results for input(s): AMMONIA in the last 168 hours. Coagulation Profile: Recent Labs  Lab 11/15/18 1026  INR 1.0   Cardiac Enzymes: No results for input(s): CKTOTAL, CKMB, CKMBINDEX, TROPONINI in the last 168 hours. BNP (last 3 results) No results for input(s): PROBNP in the last 8760 hours. HbA1C: No results for input(s): HGBA1C in the last 72 hours. CBG: Recent Labs  Lab 11/17/18 1620  GLUCAP 130*   Lipid Profile: No results for input(s): CHOL, HDL, LDLCALC, TRIG, CHOLHDL, LDLDIRECT in the last 72 hours. Thyroid Function Tests: No results for input(s): TSH, T4TOTAL, FREET4, T3FREE, THYROIDAB in the last 72 hours. Anemia Panel: No results for input(s): VITAMINB12, FOLATE, FERRITIN, TIBC, IRON, RETICCTPCT in the last 72 hours. Urine analysis:    Component Value Date/Time   COLORURINE AMBER (A) 11/14/2018 1857   APPEARANCEUR HAZY (A) 11/14/2018 1857   APPEARANCEUR Clear 08/08/2016 0900   LABSPEC 1.024 11/14/2018 1857   PHURINE 6.0 11/14/2018 1857   GLUCOSEU NEGATIVE 11/14/2018 1857   HGBUR NEGATIVE 11/14/2018 1857   BILIRUBINUR SMALL (A) 11/14/2018 1857   BILIRUBINUR negative 05/02/2018 1337   BILIRUBINUR Negative 08/08/2016 0900   KETONESUR NEGATIVE 11/14/2018 1857   PROTEINUR 100 (A) 11/14/2018 1857   UROBILINOGEN 0.2 05/02/2018 1337   NITRITE NEGATIVE 11/14/2018 1857   LEUKOCYTESUR NEGATIVE 11/14/2018 1857    Radiological Exams on Admission: Dg Chest 2 View  Result Date: 11/19/2018 CLINICAL DATA:  Shortness of breath. EXAM: CHEST - 2  VIEW COMPARISON:  11/15/2018 FINDINGS: The cardiac silhouette, mediastinal and hilar contours are within normal limits and stable. Chronic bronchitic changes. There is a small right pleural effusion. IMPRESSION: Chronic bronchitic changes and small right effusion Electronically Signed   By: Marijo Sanes M.D.   On: 11/19/2018 16:29     Assessment/Plan   David Hendricks is a 68 y.o. male with medical history significant for coronary artery disease status post drug-eluting stent to proximal LAD in October 2018, BPH, chronic tobacco abuse, who is admitted to [facility] long hospital on 11/19/2018 with HCAP pna after presenting from home to [facility] long emergency department complaining of shortness of breath.   Principal Problem:  HCAP (healthcare-associated pneumonia) Active Problems:   Tobacco abuse   Abdominal pain   Hypokalemia   BPH (benign prostatic hyperplasia)   #) Healthcare associated pneumonia: Diagnosis appears to have been made on 11/17/2018 corresponding to 48 hours after patient was admitted for suspected acute cholecystitis, at which time new onset shortness of breath and nonproductive cough that was absent at the time of admission prompted chest x-ray, which showed interval development of right lower lobe infiltrate concerning for pneumonia.  Status post 1 day of IV vancomycin as well as Unasyn, with IV vancomycin discontinued following negative MRSA PCR result.  Subsequently, patient left most compatible center [place] on 11/18/2018, but is continued to experience shortness of breath associated with nonproductive cough.  It does not appear that blood cultures were checked during this most recent prior hospitalization.  Of note, while the patient is mildly tachypneic upon presenting to [facility] long emergency department this evening, presentation is otherwise not associated with any SIRS criteria and therefore criteria are not met for sepsis at this time.  Given the criteria met for hospital associated  pneumonia given the above presentation, will continue broad-spectrum antibiotics in the form of IV vancomycin as well as Zosyn.  Will repeat MRSA PCR, and if negative, will plan to discontinue IV vancomycin given the high negative predictive value associated with a negative MRSA PCR finding in the setting of suspected pneumonia.  Presentation is associated with acute hypoxic respiratory distress, with presenting oxygen saturation noted to be 88% on room air, which is improved to 96% on 2 to 3 L nasal cannula in the context of no known baseline supplemental oxygen requirements.  Plan: Check blood cultures x2 now.  IV vancomycin and Zosyn, as above.  MRSA PCR.  Repeat CBC with differential in the morning.  Follow-up on result of COVID-19 nasopharyngeal swab performed this evening.  Droplet precautions.  As needed albuterol nebulizer.  As needed supplemental oxygen in order to maintain oxygen saturations greater than or equal to 92%.  Follow-up on result urinalysis ordered this evening.    #) History of acute cholecystitis: Hospitalized at [facility] from 11/15/2018 until 11/18/2018, at which time the patient left AMA, as further described above.  During hospitalization, he underwent percutaneous cholecystostomy tube spent on 11/17/2018 followed by IR cholangiogram demonstrating no evidence of acute abnormalities.  Percutaneous cholecystostomy tube subsequently removed on 11/18/2018 without evidence of associated complication.  Per physical exam performed today, no evidence of acute surgical abdomen, while presenting labs demonstrate no evidence of cholestatic pattern or transaminitis.  Patient reports improvement in his nausea/vomiting over the course of the last 24 hours, noting that he was able to tolerate soup over that timeframe.  Plan: Repeat CMP in the morning.  As needed IV Dilaudid as well as as needed Zofran.  Clear liquids.  Repeat CBC with differential in the morning.  Will attempt  additional chart review, including review of general surgery documentation during recent hospitalization at [facility].     #) Hypokalemia: Presenting labs reflect serum potassium of 3.4, with mildly low value suspected be on the basis of nausea/vomiting experienced during hospitalization at [facility].  Plan: Lactated Ringer's as above in addition to potassium chloride 20 mEq IV over 2 hours x 1 now.  Repeat CMP in the morning.  We will also check serum magnesium level.      #) Chronic tobacco abuse: The patient reports that he is a current smoker, having smoked approximately  2 packs/day over the last 40 to 45 years.  He politely conveys that he does not have interest in smoking discontinuation at this time, but is amenable to use of nicotine patch during this hospitalization.  Plan: Counseled the patient on the importance of complete smoking discontinuation.  Have also ordered a nicotine patch for use during this hospitalization.     #) BPH: On Flomax as an outpatient.  Plan: In the setting of infectious presentation, will hold home Flomax for now.  Monitor strict I's and O's.  Repeat CMP in the morning.     DVT prophylaxis: scd's  Code Status: full code Family Communication: None Disposition Plan:  Per Rounding Team  Consults called: None Admission status: inpatient; med-telemetry   PLEASE NOTE THAT DRAGON DICTATION SOFTWARE WAS USED IN THE CONSTRUCTION OF THIS NOTE.   [facility] Pager [phone] From [place].   Otherwise, please contact night-coverage  [email or web address] Password [facility]  11/19/2018, 6:42 PM

## 2018-11-19 NOTE — ED Provider Notes (Signed)
[facility] DEPT Provider Note   CSN: [id number] Arrival date & time: 11/19/18  1502     History   Chief Complaint Chief Complaint  Patient presents with  . Abdominal Pain    HPI David Hendricks is a 68 y.o. male.     HPI Patient presents with concern for weakness, abdominal pain, dyspnea. Notably, the patient left AGAINST MEDICAL ADVICE from our affiliated facility within the past 24 hours. He was admitted to that hospital 4 days ago, after initially having abdominal pain, dyspnea, and was found to have cholecystitis, possible pneumonia. He notes that he left due to frustration with care, but since that time has had progressive decline in his energy, increasing generalized discomfort without new focal pain beyond his right-sided upper abdominal pain, right lower chest wall pain.  Prior to the onset of this illness, the patient was in his usual state of health.  He acknowledges multiple medical issues, states that he smokes cigarettes regularly, denies alcohol use.  Since leaving AGAINST MEDICAL ADVICE earlier today, symptoms have progressed, with no relief with anything.  EMS reports that the patient required 3 L nasal cannula to have appropriate oxygen saturation.  Previously he was 92% on room air. Past Medical History:  Diagnosis Date  . Anxiety   . Arthritis   . Coronary artery disease    10/18 PCI/DES to pLAD, mild nonobstructive disease in the Lcx/RCA. Normal EF.   Marland Kitchen Dysrhythmia 2016   irregular heartbeat  . Myocardial infarction ([place])   . Seasonal allergies     Patient Active Problem List   Diagnosis Date Noted  . Cholecystitis 11/15/2018  . Goiter 11/15/2018  . Pulmonary nodules/lesions, multiple 11/15/2018  . Esophageal thickening 11/15/2018  . Abdominal pain 11/14/2018  . Cellulitis 08/14/2018  . Leg cramping 05/01/2018  . Depression, major, single episode, mild ([place]) 06/21/2017  . Rib pain on left side 06/21/2017  . Pain of  finger of left hand 05/21/2017  . Cigarette nicotine dependence without complication [id number]  . Vertigo 05/10/2017  . Old MI (myocardial infarction) 01/30/2017  . CAD (coronary artery disease) 01/30/2017  . Chest pain with moderate risk of acute coronary syndrome 01/23/2017  . Hyperlipidemia 10/10/2016  . History of non-ST elevation myocardial infarction (NSTEMI) 10/09/2016  . Difficulty sleeping 04/22/2016  . Urinary dysfunction 04/22/2016  . Benign prostatic hyperplasia with urinary frequency 02/11/2016  . Subacromial bursitis of right shoulder joint 02/11/2016    Past Surgical History:  Procedure Laterality Date  . CORONARY ANGIOPLASTY  10/09/2016  . CORONARY STENT INTERVENTION N/A 10/09/2016   Procedure: CORONARY STENT INTERVENTION;  Surgeon: Nelva Bush, MD;  Location: [facility];  Service: Cardiovascular;  Laterality: N/A;  . INTRAVASCULAR ULTRASOUND/IVUS N/A 10/09/2016   Procedure: Intravascular Ultrasound/IVUS;  Surgeon: Nelva Bush, MD;  Location: [facility];  Service: Cardiovascular;  Laterality: N/A;  . IR CHOLANGIOGRAM EXISTING TUBE  11/18/2018  . IR PERC CHOLECYSTOSTOMY  11/16/2018  . LEFT HEART CATH AND CORONARY ANGIOGRAPHY N/A 10/09/2016   Procedure: LEFT HEART CATH AND CORONARY ANGIOGRAPHY;  Surgeon: Nelva Bush, MD;  Location: [facility];  Service: Cardiovascular;  Laterality: N/A;        Home Medications    Prior to Admission medications   Medication Sig Start Date End Date Taking? Authorizing Provider  methocarbamol (ROBAXIN) 500 MG tablet Take 1 tablet (500 mg total) by mouth 2 (two) times daily. Patient taking differently: Take 500 mg by mouth 2 (two) times daily  as needed for muscle spasms.  09/17/18  Yes Lacretia Leigh, MD  tamsulosin (FLOMAX) 0.4 MG CAPS capsule Take 0.8 mg by mouth daily.   Yes [provider]  traMADol (ULTRAM) 50 MG tablet Take 1 tablet (50 mg total) by mouth every 12 (twelve) hours as needed  for up to 5 days. 11/14/18 11/19/18 Yes [name], DO  traZODone (DESYREL) 100 MG tablet Take 0.5-1 tablets (50-100 mg total) by mouth at bedtime as needed for sleep. 06/21/17  Yes [name], DO    Family History Family History  Problem Relation Age of Onset  . Cancer Father     Social History Social History   Tobacco Use  . Smoking status: Current Every Day Smoker    Packs/day: 2.00    Years: 45.00    Pack years: 90.00    Types: Cigarettes    Start date: 01/09/1961  . Smokeless tobacco: Never Used  . Tobacco comment: thinking about after settling in   Substance Use Topics  . Alcohol use: No  . Drug use: No     Allergies   Patient has no known allergies.   Review of Systems Review of Systems  Constitutional:       Per HPI, otherwise negative  HENT:       Per HPI, otherwise negative  Respiratory:       Per HPI, otherwise negative  Cardiovascular:       Per HPI, otherwise negative  Gastrointestinal: Positive for abdominal pain. Negative for vomiting.  Endocrine:       Negative aside from HPI  Genitourinary:       Neg aside from HPI   Musculoskeletal:       Per HPI, otherwise negative  Skin: Negative.   Neurological: Negative for syncope.     Physical Exam Updated Vital Signs BP (!) 153/79   Pulse 91   Temp 98.2 F (36.8 C) (Oral)   Resp (!) 22   SpO2 96%   Physical Exam Vitals signs and nursing note reviewed.  Constitutional:      General: He is not in acute distress.    Appearance: He is ill-appearing.     Comments: Disheveled adult male awake and alert  HENT:     Head: Normocephalic and atraumatic.  Eyes:     Conjunctiva/sclera: Conjunctivae normal.  Cardiovascular:     Rate and Rhythm: Normal rate and regular rhythm.  Pulmonary:     Effort: Tachypnea present.     Breath sounds: No stridor.  Abdominal:     General: There is no distension.     Tenderness: There is abdominal tenderness.     Comments: Right lateral superior abdominal  wall with prior tube site appreciable. No erythema, no drainage  Skin:    General: Skin is warm and dry.  Neurological:     Mental Status: He is alert and oriented to person, place, and time.      ED Treatments / Results  Labs (all labs ordered are listed, but only abnormal results are displayed) Labs Reviewed  COMPREHENSIVE METABOLIC PANEL - Abnormal; Notable for the following components:      Result Value   Potassium 3.4 (*)    Calcium 8.3 (*)    Total Protein 6.1 (*)    Albumin 3.2 (*)    ALT 47 (*)    All other components within normal limits  LIPASE, BLOOD  CBC  URINALYSIS, ROUTINE W REFLEX MICROSCOPIC    EKG EKG Interpretation  Date/Time:  Tuesday November 19 2018 15:44:57 EST Ventricular Rate:  86 PR Interval:    QRS Duration: 121 QT Interval:  362 QTC Calculation: 433 R Axis:   -62 Text Interpretation: Sinus rhythm Nonspecific IVCD with LAD No significant change since last tracing Confirmed by Dorie Rank [phone]) on 11/19/2018 3:51:38 PM   Radiology Dg Chest 2 View  Result Date: 11/19/2018 CLINICAL DATA:  Shortness of breath. EXAM: CHEST - 2 VIEW COMPARISON:  11/15/2018 FINDINGS: The cardiac silhouette, mediastinal and hilar contours are within normal limits and stable. Chronic bronchitic changes. There is a small right pleural effusion. IMPRESSION: Chronic bronchitic changes and small right effusion Electronically Signed   By: Marijo Sanes M.D.   On: 11/19/2018 16:29    Procedures Procedures (including critical care time)  Medications Ordered in ED Medications - No data to display   Initial Impression / Assessment and Plan / ED Course  I have reviewed the triage vital signs and the nursing notes.  Pertinent labs & imaging results that were available during my care of the patient were reviewed by me and considered in my medical decision making (see chart for details).       After the initial evaluation I reviewed the patient's chart including  documentation from his recent hospitalization at our affiliated facility. Notably, the patient had placement of a percutaneous drain tube for his acute cholecystitis, but this has been removed.  Patient also had negative CT angiography, but w e/o PNA.  Note from drain removal as below: Retracted cholecystostomy with bloody output   S/p chole drain injection and removal   Chole drain has retracted and is extahepatic   By [place], GB is completely collapsed so unable to replace at this point   Extrahepatic chole drain removed   rec follow clinically and if symptoms recur consider repeat US or CT before any attempt at replacing  CTA findings as below: IMPRESSION: 1. No pulmonary embolism. 2. Mild gallbladder wall thickening and pericholecystic fat stranding in the visualized upper abdomen, compatible with reported acute cholecystitis on abdominal sonogram study from earlier today. 3. Scattered solid pulmonary nodules, largest 7 mm in the lingula. Non-contrast chest CT at 3-6 months is recommended. If the nodules are stable at time of repeat CT, then future CT at 18-24 months (from today's scan) is considered optional for low-risk patients, but is recommended for high-risk patients. This recommendation follows the consensus statement: Guidelines for Management of Incidental Pulmonary Nodules Detected on CT Images: From the Fleischner Society 2017; Radiology 2017; 284:228-243. 4. Nonspecific circumferential wall thickening in the lower thoracic esophagus. While most commonly due to reflux esophagitis, Barrett's esophagus or esophageal neoplasm cannot be excluded by CT. GI consultation suggested. 5. Prominent goiter, asymmetric to the left. Thyroid ultrasound evaluation suggested on a short term outpatient basis.   Aortic Atherosclerosis (ICD10-I70.0) and Emphysema (ICD10-J43.9).     Electronically Signed   By: Ilona Sorrel M.D.   On: 11/15/2018 12:29   He has new O2 requirement and  w CXR / CT w PNA, ABX (vanc and zosyn) restarted.   7:02 PM Patient in similar condition Labs are generally reassuring, slightly better than recent values. However, the patient continues to require oxygen for appropriate saturation.  This adult male with notable recent hospitalization for acute cholecystitis, pneumonia presents after leaving AMA within the past 24 hours.  Line here the patient continues to require oxygen, but initial labs are generally reassuring. With concern for infection as above, both pneumonia and intra-abdominal, the  patient was restarted vancomycin, Zosyn, admitted for further monitoring, management.  Final Clinical Impressions(s) / ED Diagnoses   Final diagnoses:  HCAP (healthcare-associated pneumonia)     Carmin Muskrat, MD 11/19/18 [phone]

## 2018-11-19 NOTE — Progress Notes (Addendum)
Pharmacy Antibiotic Note  Rayson Kirschman is a 68 y.o. male admitted on 11/19/2018 with pneumonia.  Pharmacy has been consulted for Vanc/Zosyn dosing.  Plan:  Vanc 1g x 1 given at 1927, will give another 1g to = 2g total for loading dose. Then start 1250mg  IV q12 thereafter - goal AUC 400-550  Zosyn 3.375g IV q8 (extended interval infusion)  Daily SCr      Temp (24hrs), Avg:98 F (36.7 C), Min:97.8 F (36.6 C), Max:98.2 F (36.8 C)  Recent Labs  Lab 11/13/18 0934 11/15/18 0555 11/16/18 0400 11/17/18 0303 11/19/18 1608  WBC 14.8* 19.5* 21.0* 11.7* 9.1  CREATININE 0.98 0.97 0.97 1.03 0.85    Estimated Creatinine Clearance: 97.5 mL/min (by C-G formula based on SCr of 0.85 mg/dL).    No Known Allergies   Thank you for allowing pharmacy to be a part of this patient's care.  Kara Mead 11/19/2018 7:54 PM

## 2018-11-19 NOTE — Progress Notes (Signed)
0030: Pt has needed frequent redirection and encouragement to stay in the hospital for continued observation of acute cholecystitis. Pt is oriented x3-4. He is able to state his name, date, place, and situation. Pt is adamant about leaving the hospital so the can get some sleep, just after taking ambien. Pt pulled out one IV and is refusing to wear EKG leads, SpO2 monitor, BP cuff and supplemental oxygen. RN and staff tried to encourage pt to stay overnight, pt stated he wanted to leave AMA and signed [place] paperwork. Warren Lacy was notified and paged the ground team doctors to assess pt. NP at bedside, was able to convince pt to stay the night. Pt accepted fluids and a ham sandwich. Pt still refusing to wear SpO2 monitor and currently refusing staff to place RN stating he is tired of being stuck. Pt states he is signing himself out in the morning. Will continue to monitor.  Clint Bolder, RN 11/19/18 2:14 AM

## 2018-11-19 NOTE — Progress Notes (Addendum)
[facility] Progress Note Patient Name: David Hendricks DOB: 09-02-1950 MRN: [id number]   Date of Service  11/19/2018  HPI/Events of Note  Delirium/Confusion - Patient complains that he has not been able to sleep. Sleep hygiene issue? QTc interval = 0.43 seconds.   eICU Interventions  Will order: 1. Haldol 2.5 mg IV now (extra dose). 2. Ambien 5 mg PO now.      Intervention Category Major Interventions: Delirium, psychosis, severe agitation - evaluation and management  Sommer,Steven Eugene 11/19/2018, 12:15 AM

## 2018-11-19 NOTE — Telephone Encounter (Signed)
Attempted to call patient x3  To advise him to return to hospital as there is not much that I can do with what he has going on from an outpatient standpoint.  Kept getting message stating that "call can not be completed as dialed".

## 2018-11-19 NOTE — Progress Notes (Signed)
0230: Pt refusing to be on the monitor. MD aware. Will continue to monitor.

## 2018-11-20 ENCOUNTER — Other Ambulatory Visit: Payer: Self-pay

## 2018-11-20 DIAGNOSIS — N400 Enlarged prostate without lower urinary tract symptoms: Secondary | ICD-10-CM | POA: Diagnosis present

## 2018-11-20 DIAGNOSIS — E876 Hypokalemia: Secondary | ICD-10-CM | POA: Diagnosis present

## 2018-11-20 LAB — COMPREHENSIVE METABOLIC PANEL
ALT: 39 U/L (ref 0–44)
AST: 33 U/L (ref 15–41)
Albumin: 2.7 g/dL — ABNORMAL LOW (ref 3.5–5.0)
Alkaline Phosphatase: 80 U/L (ref 38–126)
Anion gap: 8 (ref 5–15)
BUN: 12 mg/dL (ref 8–23)
CO2: 26 mmol/L (ref 22–32)
Calcium: 8.2 mg/dL — ABNORMAL LOW (ref 8.9–10.3)
Chloride: 103 mmol/L (ref 98–111)
Creatinine, Ser: 0.88 mg/dL (ref 0.61–1.24)
GFR calc Af Amer: >60 mL/min (ref >60)
GFR calc non Af Amer: >60 mL/min (ref >60)
Glucose, Bld: 84 mg/dL (ref 70–99)
Potassium: 3.5 mmol/L (ref 3.5–5.1)
Sodium: 137 mmol/L (ref 135–145)
Total Bilirubin: 1.1 mg/dL (ref 0.3–1.2)
Total Protein: 5.4 g/dL — ABNORMAL LOW (ref 6.5–8.1)

## 2018-11-20 LAB — CBC WITH DIFFERENTIAL/PLATELET
Abs Immature Granulocytes: 0.04 10*3/uL (ref 0.00–0.07)
Basophils Absolute: 0.1 10*3/uL (ref 0.0–0.1)
Basophils Relative: 1 %
Eosinophils Absolute: 0.6 10*3/uL — ABNORMAL HIGH (ref 0.0–0.5)
Eosinophils Relative: 8 %
HCT: 41.8 % (ref 39.0–52.0)
Hemoglobin: 13.6 g/dL (ref 13.0–17.0)
Immature Granulocytes: 1 %
Lymphocytes Relative: 34 %
Lymphs Abs: 2.4 10*3/uL (ref 0.7–4.0)
MCH: 28 pg (ref 26.0–34.0)
MCHC: 32.5 g/dL (ref 30.0–36.0)
MCV: 86 fL (ref 80.0–100.0)
Monocytes Absolute: 0.7 10*3/uL (ref 0.1–1.0)
Monocytes Relative: 9 %
Neutro Abs: 3.3 10*3/uL (ref 1.7–7.7)
Neutrophils Relative %: 47 %
Platelets: 180 10*3/uL (ref 150–400)
RBC: 4.86 MIL/uL (ref 4.22–5.81)
RDW: 12.5 % (ref 11.5–15.5)
WBC: 7 10*3/uL (ref 4.0–10.5)
nRBC: 0 % (ref 0.0–0.2)

## 2018-11-20 LAB — SARS CORONAVIRUS 2 (TAT 6-24 HRS): SARS Coronavirus 2: NEGATIVE

## 2018-11-20 LAB — MAGNESIUM: Magnesium: 2.1 mg/dL (ref 1.7–2.4)

## 2018-11-20 LAB — PHOSPHORUS: Phosphorus: 4.2 mg/dL (ref 2.5–4.6)

## 2018-11-20 LAB — MRSA PCR SCREENING: MRSA by PCR: NEGATIVE

## 2018-11-20 MED ORDER — SIMETHICONE 80 MG PO CHEW
80.0000 mg | CHEWABLE_TABLET | Freq: Four times a day (QID) | ORAL | Status: DC | PRN
  Administered 2018-11-20: 80 mg via ORAL
  Filled 2018-11-20: qty 1

## 2018-11-20 MED ORDER — TAMSULOSIN HCL 0.4 MG PO CAPS
0.8000 mg | ORAL_CAPSULE | Freq: Every day | ORAL | Status: DC
  Administered 2018-11-20 – 2018-11-22 (×3): 0.8 mg via ORAL
  Filled 2018-11-20 (×3): qty 2

## 2018-11-20 MED ORDER — BOOST / RESOURCE BREEZE PO LIQD CUSTOM
1.0000 | Freq: Three times a day (TID) | ORAL | Status: DC
  Administered 2018-11-20 (×3): 1 via ORAL

## 2018-11-20 MED ORDER — IPRATROPIUM-ALBUTEROL 0.5-2.5 (3) MG/3ML IN SOLN: 3.0000 mL | Freq: Four times a day (QID) | RESPIRATORY_TRACT | Status: DC | PRN

## 2018-11-20 MED ORDER — ZOLPIDEM TARTRATE 5 MG PO TABS
5.0000 mg | ORAL_TABLET | Freq: Every evening | ORAL | Status: DC | PRN
  Administered 2018-11-20 – 2018-11-21 (×2): 5 mg via ORAL
  Filled 2018-11-20 (×2): qty 1

## 2018-11-20 NOTE — Progress Notes (Addendum)
PHARMACY NOTE -  [facility] has been assisting with dosing of Zosyn for HAP.  Dosage remains stable at 3.375 g IV q8 hr and need for further dosage adjustment appears unlikely at present given renal function stable and at baseline  WBC improved to WNL, remains afebrile, and SCr stable - consider de-escalation to Augmentin or Ceftin/Omnicef once respiratory status improved  Pharmacy will sign off, following peripherally for culture results or dose adjustments. Please reconsult if a change in clinical status warrants re-evaluation of dosage.  Reuel Boom, PharmD, BCPS [phone] 11/20/2018, 11:23 AM

## 2018-11-20 NOTE — Progress Notes (Signed)
PROGRESS NOTE  David Hendricks  DOB: 1950/07/06  PCP: [name], DO [id number]  DOA: 11/19/2018  LOS: 1 day   Chief Complaint  Patient presents with  . Abdominal Pain   Brief narrative: David Hendricks is a 68 y.o. male with PMH of CAD s/p stent in proximal LAD, BPH, remote history of alcohol use, chronic smoker, probably has underlying COPD. Patient presented to the ED on 11/19/2018 with shortness of breath. Chart reviewed. Patient apparently has repeated recent ER visits for worsening abdominal pain -9/8 CT without evidence of GB disease -11/3 [place] with sludge but no acute cholecystitis -11/4 CT with nonobstructing nephrolithiasis; lung nodules; normal-appearing GB -11/6 US showed extensive sludge, tiny gallstones, wall thickening and pericholecystic fluid suggestive of cholecystitis -11/6 CT abdomen with mild GB wall thickening and pericholecystic fat stranding in the upper abdomen c/w acute cholecystitis -11/6, patient was admitted at [facility].   -11/7, percutaneous cholecystostomy drain was placed by IR.  Broad-spectrum antibiotics were started.  ID consultation obtained. -11/8, patient became confused and accidentally pulled out the percutaneous drain.  IR attempted to replace the drain but unable because gallbladder was not distended.   -11/8, patient started having alcohol withdrawal symptoms.  He was started on Precedex drip, BiPAP and monitored in ICU.  Patient and his sister however adamantly deny using any alcohol recently. -11/9, patient improved and was transferred out of ICU to hospitalist medicine service. -11/10, very early in the morning, patient decided to leave AMA. -11/10, post AMA, patient's family reached out to his primary care provider asking for any antibiotic to take at home.  PCP recommended to return back to the hospital. -11/10, within 12 hours of leaving AMA from [name], family called EMS and brought the patient to ED at [facility] for continuation of  treatment.  Apparently patient is a resident of [place] and does not have any family in the area.   In the ED, he reported persistence of the shortness of breath that he was experiencing over the final 2 days of his hospitalization at [facility] before leaving AMA.   The ED, O2 sat was 88% on room air which improved to 96% on 2 to 3 L by nasal cannula. Work-up showed WBC 9.1, creatinine 0.85, liver enzymes normal, lipase 40. Urinalysis was normal Chest x-ray showed a small right pleural effusion as well as findings consistent with chronic bronchitis. Patient was apparently admitted with suspicion of healthcare associated pneumonia.  Subjective: Patient was seen and examined this morning.  Elderly Caucasian male.  Propped up in bed.  Not in distress.  On low-flow oxygen by nasal cannula.  Assessment/Plan: Acute respiratory failure with hypoxia COPD exacerbation -Reports shortness of breath for 2 days prior to presentation during his last hospitalization. -Currently wearing 2 to 3 L oxygen to maintain O2 sat more than 90% -Chest x-ray on this admission shows small right pleural effusion and chronic bronchitis.  No definite evidence of pneumonia. -Patient is a chronic smoker.  X-ray shows chronic bronchitis changes.  Likely has underlying COPD. -We will start DuoNeb.  No wheezing, no steroids needed at this time.  Acute cholecystitis -As described above, patient had percutaneous drainage placed on 11/7 which accidentally got pulled out and could not be replaced next day because the gallbladder was collapsed. -Patient is not septic in this presentation.  WBC count normal.  Abdomen benign. -Currently tolerating clear liquid diet.  Advance to full liquid diet. -As recommended in the IR note from 11/8,  if abdominal symptoms arise, will obtain a CT scan of abdomen before replacing the drain.  BPH -continue Flomax.  Recent alcohol withdrawal symptoms -Patient adamantly denies using alcohol  recently.  However, any last hospitalization, patient had withdrawal symptoms requiring ICU stay.  -Monitor for symptoms.  Mobility: Encourage ambulation.  PT eval ordered DVT prophylaxis:  SCDs Code Status:   Code Status: Full Code  Family Communication:  Expected Discharge:  Will obtain surgery evaluation.  Consultants:    Procedures:    Antimicrobials: Anti-infectives (From admission, onward)   Start     Dose/Rate Route Frequency Ordered Stop   11/20/18 0900  vancomycin (VANCOCIN) 1,250 mg in sodium chloride 0.9 % 250 mL IVPB  Status:  Discontinued     1,250 mg 166.7 mL/hr over 90 Minutes Intravenous Every 12 hours 11/19/18 1958 11/20/18 1123   11/20/18 0200  piperacillin-tazobactam (ZOSYN) IVPB 3.375 g     3.375 g 12.5 mL/hr over 240 Minutes Intravenous Every 8 hours 11/19/18 2003     11/19/18 2030  vancomycin (VANCOCIN) IVPB 1000 mg/200 mL premix     1,000 mg 200 mL/hr over 60 Minutes Intravenous  Once 11/19/18 1958 11/20/18 0026   11/19/18 1800  vancomycin (VANCOCIN) IVPB 1000 mg/200 mL premix     1,000 mg 200 mL/hr over 60 Minutes Intravenous  Once 11/19/18 1750 11/19/18 2027   11/19/18 1800  piperacillin-tazobactam (ZOSYN) IVPB 3.375 g     3.375 g 100 mL/hr over 30 Minutes Intravenous  Once 11/19/18 1750 11/19/18 1844      Diet Order            Diet clear liquid Room service appropriate? Yes; Fluid consistency: Thin  Diet effective now              Infusions:  . piperacillin-tazobactam (ZOSYN)  IV 3.375 g (11/20/18 1113)    Scheduled Meds: . feeding supplement  1 Container Oral TID BM  . nicotine  21 mg Transdermal Daily  . sodium chloride flush  3 mL Intravenous Q12H    PRN meds: acetaminophen **OR** acetaminophen, albuterol, HYDROmorphone (DILAUDID) injection, ondansetron (ZOFRAN) IV   Objective: Vitals:   11/20/18 1244 11/20/18 1247  BP: (!) 108/50   Pulse: (!) 58   Resp: (!) 22   Temp: 99.1 F (37.3 C)   SpO2: 91% 94%    Intake/Output  Summary (Last 24 hours) at 11/20/2018 1549 Last data filed at 11/20/2018 1524 Gross per 24 hour  Intake 895.01 ml  Output 1350 ml  Net -454.99 ml   There were no vitals filed for this visit. Weight change:  Body mass index is 27.2 kg/m.   Physical Exam: General exam: Appears calm and comfortable.  Skin: No rashes, lesions or ulcers. HEENT: Atraumatic, normocephalic, supple neck, no obvious bleeding Lungs: Clear to auscultation bilaterally CVS: Regular rate and rhythm, no murmur GI/Abd soft, mild RUQ tenderness, nondistended, bowel sound present CNS: Alert, awake, oriented x3 Psychiatry: Mood appropriate Extremities: No pedal edema, no calf tenderness  Data Review: I have personally reviewed the laboratory data and studies available.  Recent Labs  Lab 11/15/18 0555 11/16/18 0400 11/17/18 0303 11/19/18 1608 11/20/18 0524  WBC 19.5* 21.0* 11.7* 9.1 7.0  NEUTROABS  --   --   --   --  3.3  HGB 17.5* 16.1 14.1 14.8 13.6  HCT 51.3 46.9 43.3 44.3 41.8  MCV 83.6 83.9 86.8 85.7 86.0  PLT 132* 135* 111* 170 180   Recent Labs  Lab 11/15/18 0555  11/16/18 0400 11/17/18 0303 11/19/18 1608 11/20/18 0524  NA 132* 133* 135 137 137  K 3.6 4.1 4.2 3.4* 3.5  CL 94* 96* 97* 102 103  CO2 27 26 29 27 26   GLUCOSE 141* 108* 92 97 84  BUN 12 11 18 16 12   CREATININE 0.97 0.97 1.03 0.85 0.88  CALCIUM 9.2 9.0 8.5* 8.3* 8.2*  MG  --   --  2.4  --  2.1  PHOS  --   --  4.6  --  4.2    Terrilee Croak, MD  [facility] 11/20/2018

## 2018-11-20 NOTE — Progress Notes (Signed)
Initial Nutrition Assessment  RD working remotely.  DOCUMENTATION CODES:   Not applicable  INTERVENTION:   - Boost Breeze po TID, each supplement provides 250 kcal and 9 grams of protein  - Once diet advanced further, will order Ensure Enlive po BID, each supplement provides 350 kcal and 20 grams of protein  NUTRITION DIAGNOSIS:   Increased nutrient needs related to acute illness (cholecystitis, pneumonia) as evidenced by estimated needs.  GOAL:   Patient will meet greater than or equal to 90% of their needs  MONITOR:   PO intake, Supplement acceptance, Diet advancement, Labs, Weight trends  REASON FOR ASSESSMENT:   Malnutrition Screening Tool    ASSESSMENT:   68 year old male who presented to the ED on 11/10 with LLQ abdominal pain and SOB. Pt left AMA from [facility] within the last 24 hours of arrival to [facility] ED. Pt was admitted to the hospital 4 days ago and was found to have cholecystitis and possible pneumonia. Pt had a percutaneous drain placed for acute cholecystitis which was later removed. PMH anxiety, CAD, tobacco abuse.   Per H&P, pt reported improvement in N/V over the last 24 hours and reported being able to tolerate soup over that timeframe.  Spoke with pt via phone call to room. Pt reports that he did well with his all-liquid breakfast tray this AM. Pt endorses having some "indigestion" but has not vomited and does not think that he will.  Pt reports that he typically has a fair appetite. Pt reports that he lives alone and does not feel like cooking for just himself, so he will have easily prepared meals. Pt typically eats 3 meals daily.  Breakfast: sausage biscuit and coffee Lunch: sandwich Dinner: TV dinner  Pt endorses some weight loss related to "stomach problems." Pt reports his UBW as between 210-220 lbs. Reviewed weight history in chart. Weight stable over the last 14 months.  When asked about his "stomach problems," pt reports having indigestion/acid  reflux at night which can cause him to have to get up in the middle of the night to vomit. Because of this, pt reports he has "laid off" of eating as much. Pt states that this has been going on for a couple of months.  Pt is amenable to RD ordering clear liquid oral nutrition supplement to aid pt in meeting kcal and protein needs. Will monitor for diet advancement and adjust supplement regimen.  Meal Completion: 100% x 1 meal (clear liquids)  Medications reviewed and include: IV abx  Labs reviewed.  NUTRITION - FOCUSED PHYSICAL EXAM:  Unable to complete at this time. RD working remotely.  Diet Order:   Diet Order            Diet clear liquid Room service appropriate? Yes; Fluid consistency: Thin  Diet effective now              EDUCATION NEEDS:   Education needs have been addressed  Skin:  Skin Assessment: Reviewed RN Assessment  Last BM:  11/20/18 large type 4  Height:   Ht Readings from Last 1 Encounters:  11/19/18 6\' 1"  (1.854 m)    Weight:   Wt Readings from Last 1 Encounters:  11/15/18 93.5 kg    Ideal Body Weight:  83.6 kg  BMI:  Body mass index is 27.2 kg/m.  Estimated Nutritional Needs:   Kcal:  2100-2300  Protein:  105-120 grams  Fluid:  >/= 2.0 L    Gaynell Face, MS, RD, LDN Inpatient Clinical Dietitian  Pager: [phone] Weekend/After Hours: [phone]

## 2018-11-20 NOTE — Progress Notes (Signed)
Patient recently c/o worsening upper abdominal pain. Pt states pain was radiating from left ear down to his mid epigastric area. Pt did show an area of the pain on the left side of his chest, and stated it felt like "pressure." EKG done and VSS. O2 Sats 87-91% RA- pt frequently removes O2. [place] reapplied in nose and O2 Sats increased. Pt denied SOB but does have a strong, congested cough. Pt now states his pain is gone since he "dozed off for a minute." Will make MD aware and continue to monitor.

## 2018-11-21 ENCOUNTER — Inpatient Hospital Stay (HOSPITAL_COMMUNITY): Payer: Medicare Other

## 2018-11-21 LAB — AEROBIC/ANAEROBIC CULTURE W GRAM STAIN (SURGICAL/DEEP WOUND)
Culture: NO GROWTH
Gram Stain: NONE SEEN

## 2018-11-21 MED ORDER — GUAIFENESIN ER 600 MG PO TB12
600.0000 mg | ORAL_TABLET | Freq: Two times a day (BID) | ORAL | Status: DC
  Administered 2018-11-21 – 2018-11-22 (×3): 600 mg via ORAL
  Filled 2018-11-21 (×3): qty 1

## 2018-11-21 MED ORDER — GUAIFENESIN-DM 100-10 MG/5ML PO SYRP
5.0000 mL | ORAL_SOLUTION | ORAL | Status: DC | PRN
  Administered 2018-11-21 – 2018-11-22 (×2): 5 mL via ORAL
  Filled 2018-11-21 (×2): qty 10

## 2018-11-21 MED ORDER — SALINE SPRAY 0.65 % NA SOLN
1.0000 | NASAL | Status: DC | PRN
  Filled 2018-11-21: qty 44

## 2018-11-21 MED ORDER — ENSURE ENLIVE PO LIQD
237.0000 mL | Freq: Two times a day (BID) | ORAL | Status: DC
  Administered 2018-11-21 – 2018-11-22 (×4): 237 mL via ORAL

## 2018-11-21 MED ORDER — LORATADINE 10 MG PO TABS
10.0000 mg | ORAL_TABLET | Freq: Every day | ORAL | Status: DC | PRN
  Administered 2018-11-21: 10 mg via ORAL
  Filled 2018-11-21: qty 1

## 2018-11-21 NOTE — Progress Notes (Signed)
PROGRESS NOTE  David Hendricks  DOB: 1950/10/25  PCP: [name], DO ZA:[id number]  DOA: 11/19/2018  LOS: 2 days   Chief Complaint  Patient presents with   Abdominal Pain   Brief narrative: David Hendricks is a 68 y.o. male with PMH of CAD s/p stent in proximal LAD, BPH, remote history of alcohol use, chronic smoker, probably has underlying COPD. Patient presented to the ED on 11/19/2018 with shortness of breath. Chart reviewed. Patient apparently has repeated recent ER visits for worsening abdominal pain -9/8 CT without evidence of GB disease -11/3 [place] with sludge but no acute cholecystitis -11/4 CT with nonobstructing nephrolithiasis; lung nodules; normal-appearing GB -11/6 US showed extensive sludge, tiny gallstones, wall thickening and pericholecystic fluid suggestive of cholecystitis -11/6 CT abdomen with mild GB wall thickening and pericholecystic fat stranding in the upper abdomen c/w acute cholecystitis -11/6, patient was admitted at [facility].   -11/7, percutaneous cholecystostomy drain was placed by IR.  Broad-spectrum antibiotics were started.  ID consultation obtained. -11/8, patient became confused and accidentally pulled out the percutaneous drain.  IR attempted to replace the drain but unable because gallbladder was not distended.   -11/8, patient started having alcohol withdrawal symptoms.  He was started on Precedex drip, BiPAP and monitored in ICU.  Patient and his sister however adamantly deny using any alcohol recently. -11/9, patient improved and was transferred out of ICU to hospitalist medicine service. -11/10, very early in the morning, patient decided to leave AMA. -11/10, post AMA, patient's family reached out to his primary care provider asking for any antibiotic to take at home.  PCP recommended to return back to the hospital. -11/10, within 12 hours of leaving AMA from [name], family called EMS and brought the patient to ED at [facility] for continuation of  treatment.  Apparently patient is a resident of [place] and does not have any family in the area.   In the ED, he reported persistence of the shortness of breath that he was experiencing over the final 2 days of his hospitalization at [facility] before leaving AMA.   The ED, O2 sat was 88% on room air which improved to 96% on 2 to 3 L by nasal cannula. Work-up showed WBC 9.1, creatinine 0.85, liver enzymes normal, lipase 40. Urinalysis was normal Chest x-ray showed a small right pleural effusion as well as findings consistent with chronic bronchitis. Patient was apparently admitted with suspicion of healthcare associated pneumonia.  Subjective: Patient was seen and examined this morning.  Elderly Caucasian male.  Propped up in bed.  Not in distress.  On low-flow oxygen by nasal cannula.  Assessment/Plan: Acute respiratory failure with hypoxia COPD exacerbation -Reports shortness of breath for 2 days prior to presentation during his last hospitalization. -Chest x-ray on this admission shows small right pleural effusion and chronic bronchitis.  No definite evidence of pneumonia. -Currently wearing 2 to 3 L oxygen to maintain O2 sat more than 90% -Patient is a chronic smoker.  X-ray shows chronic bronchitis changes.  Likely has underlying COPD. -Currently improving with DuoNeb.  Not on steroids.  Acute cholecystitis -As described above, patient had percutaneous drainage placed on 11/7 which accidentally got pulled out and could not be replaced next day because the gallbladder was collapsed. -Patient is not septic in this presentation. WBC count normal.  Abdomen benign. -General surgery consultation obtained today.  Right upper quadrant ultrasound was repeated which did not show any evidence of acute cholecystitis.  Gallstones noted.  Surgery recommended outpatient follow-up in the office. -Currently tolerating full liquid diet.  Advance to soft diet.  BPH -continue Flomax.  Recent  alcohol withdrawal symptoms -Patient adamantly denies using alcohol recently.  However, any last hospitalization, patient had withdrawal symptoms requiring ICU stay.  -Monitor for symptoms.  Mobility: Encourage ambulation.  PT eval ordered DVT prophylaxis:  SCDs Code Status:   Code Status: Full Code  Family Communication:  Expected Discharge:  Anticipate discharge to home tomorrow on short course of oral antibiotics per my conversation with patient's family.  Consultants:  General surgery  Procedures:  None  Antimicrobials: Anti-infectives (From admission, onward)   Start     Dose/Rate Route Frequency Ordered Stop   11/20/18 0900  vancomycin (VANCOCIN) 1,250 mg in sodium chloride 0.9 % 250 mL IVPB  Status:  Discontinued     1,250 mg 166.7 mL/hr over 90 Minutes Intravenous Every 12 hours 11/19/18 1958 11/20/18 1123   11/20/18 0200  piperacillin-tazobactam (ZOSYN) IVPB 3.375 g     3.375 g 12.5 mL/hr over 240 Minutes Intravenous Every 8 hours 11/19/18 2003     11/19/18 2030  vancomycin (VANCOCIN) IVPB 1000 mg/200 mL premix     1,000 mg 200 mL/hr over 60 Minutes Intravenous  Once 11/19/18 1958 11/20/18 0026   11/19/18 1800  vancomycin (VANCOCIN) IVPB 1000 mg/200 mL premix     1,000 mg 200 mL/hr over 60 Minutes Intravenous  Once 11/19/18 1750 11/19/18 2027   11/19/18 1800  piperacillin-tazobactam (ZOSYN) IVPB 3.375 g     3.375 g 100 mL/hr over 30 Minutes Intravenous  Once 11/19/18 1750 11/19/18 1844      Diet Order            Diet full liquid Room service appropriate? Yes; Fluid consistency: Thin  Diet effective now              Infusions:   piperacillin-tazobactam (ZOSYN)  IV 3.375 g (11/21/18 1006)    Scheduled Meds:  feeding supplement (ENSURE ENLIVE)  237 mL Oral BID BM   guaiFENesin  600 mg Oral BID   nicotine  21 mg Transdermal Daily   sodium chloride flush  3 mL Intravenous Q12H   tamsulosin  0.8 mg Oral Daily    PRN meds: acetaminophen **OR**  acetaminophen, guaiFENesin-dextromethorphan, HYDROmorphone (DILAUDID) injection, ipratropium-albuterol, ondansetron (ZOFRAN) IV, simethicone, zolpidem   Objective: Vitals:   11/21/18 0515 11/21/18 1444  BP: (!) 145/75 139/61  Pulse: 67 79  Resp: 18 19  Temp: 97.9 F (36.6 C) 97.8 F (36.6 C)  SpO2: 94% 91%    Intake/Output Summary (Last 24 hours) at 11/21/2018 1616 Last data filed at 11/21/2018 1445 Gross per 24 hour  Intake 608.2 ml  Output 2600 ml  Net -1991.8 ml   Filed Weights   11/21/18 0455  Weight: 92.6 kg   Weight change:  Body mass index is 26.93 kg/m.   Physical Exam: General exam: Appears calm and comfortable.  Low-flow oxygen by nasal cannula. Skin: No rashes, lesions or ulcers. HEENT: Atraumatic, normocephalic, supple neck, no obvious bleeding Lungs: Clear to auscultation bilaterally CVS: Regular rate and rhythm, no murmur GI/Abd soft, mild RUQ tenderness, nondistended, bowel sound present CNS: Alert, awake, oriented x3 Psychiatry: Mood appropriate Extremities: No pedal edema, no calf tenderness  Data Review: I have personally reviewed the laboratory data and studies available.  Recent Labs  Lab 11/15/18 0555 11/16/18 0400 11/17/18 0303 11/19/18 1608 11/20/18 0524  WBC 19.5* 21.0* 11.7* 9.1 7.0  NEUTROABS  --   --   --   --  3.3  HGB 17.5* 16.1 14.1 14.8 13.6  HCT 51.3 46.9 43.3 44.3 41.8  MCV 83.6 83.9 86.8 85.7 86.0  PLT 132* 135* 111* 170 180   Recent Labs  Lab 11/15/18 0555 11/16/18 0400 11/17/18 0303 11/19/18 1608 11/20/18 0524  NA 132* 133* 135 137 137  K 3.6 4.1 4.2 3.4* 3.5  CL 94* 96* 97* 102 103  CO2 27 26 29 27 26   GLUCOSE 141* 108* 92 97 84  BUN 12 11 18 16 12   CREATININE 0.97 0.97 1.03 0.85 0.88  CALCIUM 9.2 9.0 8.5* 8.3* 8.2*  MG  --   --  2.4  --  2.1  PHOS  --   --  4.6  --  4.2    Terrilee Croak, MD  [facility] 11/21/2018

## 2018-11-21 NOTE — Evaluation (Signed)
Physical Therapy Evaluation Patient Details Name: David Hendricks MRN: [id number] DOB: Jun 06, 1950 Today's Date: 11/21/2018   History of Present Illness  Patient is a 68 y.o. male with medical history significant for coronary artery disease status post drug-eluting stent to proximal LAD in October 2018, BPH, chronic tobacco abuse, who is admitted to [facility] long hospital on 11/19/2018 with HCAP pna after presenting from home to [facility] long emergency department complaining of shortness of breath. The patient was recently admitted to [facility] on 11/15/2018 for suspected acute cholecystitis after presenting with right upper quadrant abdominal discomfort associated with nausea/vomiting.  On 11/17/2018 it appears that the patient underwent percutaneous cholecystostomy tube placement via interventional radiology, with ensuing cholangiogram demonstrating no abnormalities.  Subsequently, on 11/18/2018, he underwent removal of percutaneous cholecystostomy tube without complication.  Hospital course was complicated by development of shortness of breath on 11/17/2018, prompting chest x-ray which showed interval development of right lower lobe airspace opacity concerning for pneumonia.    Clinical Impression  David Hendricks is 68 y.o. male admitted with above HPI and diagnosis. Patient is currently limited by functional impairments below (see PT problem list). Patient lives alone and is independent at baseline. Patient will benefit from continued skilled PT interventions to address impairments and progress independence with mobility. Acute PT will follow and progress as able.  Pt is close to baseline but is unsteady with gait and desaturates to 88% SpO2 on RA with mobility. He improved saturation with 2L/min. Patient will benefit from [facility] training and he was educated on how/where to obtain cane when he is medically ready to return home.     Follow Up Recommendations No PT follow up    Equipment  Recommendations  None recommended by PT(educated pt to obtain a [facility])    Recommendations for Other Services       Precautions / Restrictions Precautions Precautions: Fall Restrictions Weight Bearing Restrictions: No      Mobility  Bed Mobility Overal bed mobility: Modified Independent                Transfers Overall transfer level: Modified independent                  Ambulation/Gait Ambulation/Gait assistance: Min guard;Min assist Gait Distance (Feet): 160 Feet Assistive device: IV Pole;None Gait Pattern/deviations: Step-through pattern;Decreased stride length;Narrow base of support;Shuffle Gait velocity: WNLs   General Gait Details: pt unsteady with no UE support for gait requiring min assist to prevent LOB on 2 episodes, pt balance improved with single UE support with IV pole. Pt's SpO2 desaturated to 88% on RA with ambulation, when O2 donned sitting EOD saturation improved to 95-99%.  Stairs            Wheelchair Mobility    Modified Rankin (Stroke Patients Only)       Balance Overall balance assessment: Needs assistance Sitting-balance support: Feet supported;No upper extremity supported Sitting balance-Leahy Scale: Good     Standing balance support: During functional activity;No upper extremity supported;Single extremity supported Standing balance-Leahy Scale: Fair   Single Leg Stance - Right Leg: 4 Single Leg Stance - Left Leg: 3                Pertinent Vitals/Pain Pain Assessment: No/denies pain    Home Living Family/patient expects to be discharged to:: Private residence Living Arrangements: Alone Available Help at Discharge: Friend(s);Available PRN/intermittently Type of Home: Apartment Home Access: Level entry     Home Layout: One level Home Equipment: None;Grab  bars - tub/shower Additional Comments: pt's has a dog and the dog is currently at a kennel while he is in the hospital    Prior Function Level of  Independence: Independent         Comments: pt walks his dog daily but does not do exercise     Hand Dominance   Dominant Hand: Left    Extremity/Trunk Assessment   Upper Extremity Assessment Upper Extremity Assessment: Overall WFL for tasks assessed    Lower Extremity Assessment Lower Extremity Assessment: Overall WFL for tasks assessed    Cervical / Trunk Assessment Cervical / Trunk Assessment: Normal  Communication   Communication: No difficulties  Cognition Arousal/Alertness: Awake/alert Behavior During Therapy: WFL for tasks assessed/performed Overall Cognitive Status: Within Functional Limits for tasks assessed             General Comments General comments (skin integrity, edema, etc.): 5x Sit<>Stand Test: pt performed with UE support on thighs to initiate power up, performed in 20.8 seconds.    Exercises     Assessment/Plan    PT Assessment Patient needs continued PT services  PT Problem List Decreased strength;Decreased balance;Decreased mobility;Decreased knowledge of use of DME       PT Treatment Interventions DME instruction;Functional mobility training;Therapeutic activities;Gait training;Stair training;Therapeutic exercise;Balance training;Patient/family education    PT Goals (Current goals can be found in the Care Plan section)  Acute Rehab PT Goals Patient Stated Goal: get back home and get to dog PT Goal Formulation: With patient Time For Goal Achievement: 12/05/18 Potential to Achieve Goals: Good    Frequency Min 3X/week    AM-PAC PT "6 Clicks" Mobility  Outcome Measure Help needed turning from your back to your side while in a flat bed without using bedrails?: None Help needed moving from lying on your back to sitting on the side of a flat bed without using bedrails?: None Help needed moving to and from a bed to a chair (including a wheelchair)?: A Little Help needed standing up from a chair using your arms (e.g., wheelchair or  bedside chair)?: A Little Help needed to walk in hospital room?: A Little Help needed climbing 3-5 steps with a railing? : A Little 6 Click Score: 20    End of Session   Activity Tolerance: Patient tolerated treatment well Patient left: in bed;with call bell/phone within reach;with bed alarm set Nurse Communication: Mobility status PT Visit Diagnosis: Unsteadiness on feet (R26.81);Other abnormalities of gait and mobility (R26.89)    Time: 1352-1416 PT Time Calculation (min) (ACUTE ONLY): 24 min   Charges:   PT Evaluation $PT Eval Low Complexity: 1 Low PT Treatments $Gait Training: 8-22 mins        Kipp Brood, PT, DPT Physical Therapist with [facility]  11/21/2018 3:12 PM

## 2018-11-21 NOTE — Progress Notes (Signed)
[facility] Progress Note     Subjective: CC: hasn't been able to sleep Patient's chief concern is that he has not been able to sleep much in 2 nights, was given [place] but this did not help. He denies abdominal pain, N/V. He reports he is on a liquid diet and has had loose stools secondary to this. Does not note pain or nausea with eating or drinking. Denies chest pain or SOB.   Patient was seen by CCS over at [facility] last week and through the weekend and had perc chole placed, but this was accidentally removed. He does not remember any details of that. He signed out [facility] 11/10 and then came to [facility] the same day at urging of PCP and family.   Objective: Vital signs in last 24 hours: Temp:  [97.6 F (36.4 C)-99.1 F (37.3 C)] 97.9 F (36.6 C) (11/12 0515) Pulse Rate:  [58-70] 67 (11/12 0515) Resp:  [18-22] 18 (11/12 0515) BP: (108-145)/(50-75) 145/75 (11/12 0515) SpO2:  [91 %-94 %] 94 % (11/12 0515) Weight:  [92.6 kg] 92.6 kg (11/12 0455) Last BM Date: 11/20/18  Intake/Output from previous day: 11/11 0701 - 11/12 0700 In: 480 [P.O.:480] Out: 2800 [Urine:2800] Intake/Output this shift: No intake/output data recorded.  PE: Gen:  Alert, NAD Card:  Regular rate and rhythm, pedal pulses 2+ BL Pulm:  Normal effort, clear to auscultation bilaterally Abd: Soft, non-tender, non-distended, +BS Skin: warm and dry, no rashes    Lab Results:  Recent Labs    11/19/18 1608 11/20/18 0524  WBC 9.1 7.0  HGB 14.8 13.6  HCT 44.3 41.8  PLT 170 180   BMET Recent Labs    11/19/18 1608 11/20/18 0524  NA 137 137  K 3.4* 3.5  CL 102 103  CO2 27 26  GLUCOSE 97 84  BUN 16 12  CREATININE 0.85 0.88  CALCIUM 8.3* 8.2*   PT/INR No results for input(s): LABPROT, INR in the last 72 hours. CMP     Component Value Date/Time   NA 137 11/20/2018 0524   K 3.5 11/20/2018 0524   CL 103 11/20/2018 0524   CO2 26 11/20/2018 0524   GLUCOSE 84 11/20/2018 0524   BUN 12  11/20/2018 0524   CREATININE 0.88 11/20/2018 0524   CALCIUM 8.2 (L) 11/20/2018 0524   PROT 5.4 (L) 11/20/2018 0524   PROT 6.3 12/04/2016 1029   ALBUMIN 2.7 (L) 11/20/2018 0524   ALBUMIN 4.0 12/04/2016 1029   AST 33 11/20/2018 0524   ALT 39 11/20/2018 0524   ALKPHOS 80 11/20/2018 0524   BILITOT 1.1 11/20/2018 0524   BILITOT 0.6 12/04/2016 1029   GFRNONAA >60 11/20/2018 0524   GFRAA >60 11/20/2018 0524   Lipase     Component Value Date/Time   LIPASE 40 11/19/2018 1608       Studies/Results: Dg Chest 2 View  Result Date: 11/19/2018 CLINICAL DATA:  Shortness of breath. EXAM: CHEST - 2 VIEW COMPARISON:  11/15/2018 FINDINGS: The cardiac silhouette, mediastinal and hilar contours are within normal limits and stable. Chronic bronchitic changes. There is a small right pleural effusion. IMPRESSION: Chronic bronchitic changes and small right effusion Electronically Signed   By: Marijo Sanes M.D.   On: 11/19/2018 16:29    Anti-infectives: Anti-infectives (From admission, onward)   Start     Dose/Rate Route Frequency Ordered Stop   11/20/18 0900  vancomycin (VANCOCIN) 1,250 mg in sodium chloride 0.9 % 250 mL IVPB  Status:  Discontinued  1,250 mg 166.7 mL/hr over 90 Minutes Intravenous Every 12 hours 11/19/18 1958 11/20/18 1123   11/20/18 0200  piperacillin-tazobactam (ZOSYN) IVPB 3.375 g     3.375 g 12.5 mL/hr over 240 Minutes Intravenous Every 8 hours 11/19/18 2003     11/19/18 2030  vancomycin (VANCOCIN) IVPB 1000 mg/200 mL premix     1,000 mg 200 mL/hr over 60 Minutes Intravenous  Once 11/19/18 1958 11/20/18 0026   11/19/18 1800  vancomycin (VANCOCIN) IVPB 1000 mg/200 mL premix     1,000 mg 200 mL/hr over 60 Minutes Intravenous  Once 11/19/18 1750 11/19/18 2027   11/19/18 1800  piperacillin-tazobactam (ZOSYN) IVPB 3.375 g     3.375 g 100 mL/hr over 30 Minutes Intravenous  Once 11/19/18 1750 11/19/18 1844       Assessment/Plan CAD, Hx of MI s/p stent - previously on  Brilinta (reports he is no longer taking) Remote alcohol abuse - required ICU and precedex during recent hospitalization earlier this month COPD exacerbation - was admitted with suspicion of PNA originally, primary service does not feel like he definitely has PNA BPH  Acute Cholecystitis - s/p perc chole tube 11/7, cultures with no growth  - patient became confused/agitated 11/8 and removed perc chole - IR attempted replacement but were unable due to gallbladder being collapsed - IR recommended repeat CT or [place] before attempting replacement of perc chole drain  - patient left AMA 11/10 from [facility] and in same day showed up to [facility] - LFTs normal, Tbili 1.1, WBC 7 - patient non-tender on abdominal exam, negative Murphy sign - will obtain RUQ [place] and discuss surgery vs re-attempt at drainage vs medical management with MD  FEN: FLD VTE -SCDs ID -Rocephin 11/6. Zosyn 11/6 >>11/7, unasyn 11/7>11/10, vancomycin 11/8>11/10; Zosyn 11/10>>  LOS: 2 days    Brigid Re , [facility] 11/21/2018, 9:00 AM Please see Amion for pager number during day hours 7:00am-4:30pm

## 2018-11-22 ENCOUNTER — Other Ambulatory Visit: Payer: Self-pay | Admitting: *Deleted

## 2018-11-22 LAB — COMPREHENSIVE METABOLIC PANEL
ALT: 32 U/L (ref 0–44)
AST: 21 U/L (ref 15–41)
Albumin: 2.9 g/dL — ABNORMAL LOW (ref 3.5–5.0)
Alkaline Phosphatase: 73 U/L (ref 38–126)
Anion gap: 9 (ref 5–15)
BUN: 9 mg/dL (ref 8–23)
CO2: 25 mmol/L (ref 22–32)
Calcium: 8.4 mg/dL — ABNORMAL LOW (ref 8.9–10.3)
Chloride: 102 mmol/L (ref 98–111)
Creatinine, Ser: 0.77 mg/dL (ref 0.61–1.24)
GFR calc Af Amer: >60 mL/min (ref >60)
GFR calc non Af Amer: >60 mL/min (ref >60)
Glucose, Bld: 90 mg/dL (ref 70–99)
Potassium: 3.5 mmol/L (ref 3.5–5.1)
Sodium: 136 mmol/L (ref 135–145)
Total Bilirubin: 1 mg/dL (ref 0.3–1.2)
Total Protein: 5.8 g/dL — ABNORMAL LOW (ref 6.5–8.1)

## 2018-11-22 LAB — CBC
HCT: 45.1 % (ref 39.0–52.0)
Hemoglobin: 15 g/dL (ref 13.0–17.0)
MCH: 28 pg (ref 26.0–34.0)
MCHC: 33.3 g/dL (ref 30.0–36.0)
MCV: 84.3 fL (ref 80.0–100.0)
Platelets: 229 10*3/uL (ref 150–400)
RBC: 5.35 MIL/uL (ref 4.22–5.81)
RDW: 12.6 % (ref 11.5–15.5)
WBC: 9.7 10*3/uL (ref 4.0–10.5)
nRBC: 0 % (ref 0.0–0.2)

## 2018-11-22 LAB — MAGNESIUM: Magnesium: 2.2 mg/dL (ref 1.7–2.4)

## 2018-11-22 LAB — PHOSPHORUS: Phosphorus: 4.1 mg/dL (ref 2.5–4.6)

## 2018-11-22 MED ORDER — METHOCARBAMOL 500 MG PO TABS
500.0000 mg | ORAL_TABLET | Freq: Four times a day (QID) | ORAL | Status: DC | PRN
  Administered 2018-11-22: 500 mg via ORAL
  Filled 2018-11-22: qty 1

## 2018-11-22 MED ORDER — ZOLPIDEM TARTRATE 5 MG PO TABS: 5.0000 mg | ORAL_TABLET | Freq: Every evening | ORAL | 0 refills | Status: DC | PRN

## 2018-11-22 MED ORDER — LORAZEPAM 1 MG PO TABS: 1.0000 mg | ORAL_TABLET | ORAL | Status: DC | PRN

## 2018-11-22 MED ORDER — GUAIFENESIN ER 600 MG PO TB12: 600.0000 mg | ORAL_TABLET | Freq: Two times a day (BID) | ORAL | 0 refills | Status: AC

## 2018-11-22 MED ORDER — FOLIC ACID 1 MG PO TABS
1.0000 mg | ORAL_TABLET | Freq: Every day | ORAL | Status: DC
  Administered 2018-11-22: 1 mg via ORAL
  Filled 2018-11-22: qty 1

## 2018-11-22 MED ORDER — TAMSULOSIN HCL 0.4 MG PO CAPS: 0.8000 mg | ORAL_CAPSULE | Freq: Every day | ORAL | 0 refills | Status: AC

## 2018-11-22 MED ORDER — AMOXICILLIN-POT CLAVULANATE 875-125 MG PO TABS: 1.0000 | ORAL_TABLET | Freq: Two times a day (BID) | ORAL | 0 refills | Status: AC

## 2018-11-22 MED ORDER — ADULT MULTIVITAMIN W/MINERALS CH
1.0000 | ORAL_TABLET | Freq: Every day | ORAL | Status: DC
  Administered 2018-11-22: 1 via ORAL
  Filled 2018-11-22: qty 1

## 2018-11-22 MED ORDER — LORAZEPAM 2 MG/ML IJ SOLN: 1.0000 mg | INTRAMUSCULAR | Status: DC | PRN

## 2018-11-22 MED ORDER — THIAMINE HCL 100 MG/ML IJ SOLN: 100.0000 mg | Freq: Every day | INTRAMUSCULAR | Status: DC

## 2018-11-22 MED ORDER — ENSURE ENLIVE PO LIQD: 237.0000 mL | Freq: Two times a day (BID) | ORAL | 0 refills | Status: DC

## 2018-11-22 MED ORDER — VITAMIN B-1 100 MG PO TABS
100.0000 mg | ORAL_TABLET | Freq: Every day | ORAL | Status: DC
  Administered 2018-11-22: 100 mg via ORAL
  Filled 2018-11-22: qty 1

## 2018-11-22 NOTE — Discharge Summary (Signed)
Physician Discharge Summary  David Hendricks [id number] DOB: Aug 10, 1950 DOA: 11/19/2018  PCP: [name], DO  Admit date: 11/19/2018 Discharge date: 11/22/2018  Admitted From: Home Discharge disposition: Home   Code Status: Full Code  Diet Recommendation: Cardiac diet   Recommendations for Outpatient Follow-Up:   1. Follow-up with PCP as an outpatient 2. Follow-up with general surgery as an outpatient  Discharge Diagnosis:   Principal Problem:   HCAP (healthcare-associated pneumonia) Active Problems:   Tobacco abuse   Abdominal pain   Hypokalemia   BPH (benign prostatic hyperplasia)   History of Present Illness / Brief narrative:  David Hendricks is a 68 y.o. male with PMH of CAD s/p stent in proximal LAD, BPH, remote history of alcohol use, chronic smoker, probably has underlying COPD. Patient presented to the ED on 11/19/2018 with shortness of breath. Chart reviewed. Patient apparently has repeated recent ER visits for worsening abdominal pain -9/8 CT without evidence of GB disease -11/3 [place] with sludge but no acute cholecystitis -11/4 CT with nonobstructing nephrolithiasis; lung nodules; normal-appearing GB -11/6 US showed extensive sludge, tiny gallstones, wall thickening and pericholecystic fluid suggestive of cholecystitis -11/6 CT abdomen with mild GB wall thickening and pericholecystic fat stranding in the upper abdomen c/w acute cholecystitis -11/6, patient was admitted at [facility].   -11/7, percutaneous cholecystostomy drain was placed by IR.  Broad-spectrum antibiotics were started.  ID consultation obtained. -11/8, patient became confused and accidentally pulled out the percutaneous drain.  IR attempted to replace the drain but unable because gallbladder was not distended.   -11/8, patient started having alcohol withdrawal symptoms.  He was started on Precedex drip, BiPAP and monitored in ICU.  Patient and his sister however adamantly deny using any alcohol  recently. -11/9, patient improved and was transferred out of ICU to hospitalist medicine service. -11/10, very early in the morning, patient decided to leave AMA. -11/10, post AMA, patient's family reached out to his primary care provider asking for any antibiotic to take at home.  PCP recommended to return back to the hospital. -11/10, within 12 hours of leaving AMA from [name], family called EMS and brought the patient to ED at [facility] for continuation of treatment.  Apparently patient [place] a resident [place] and does not have any family in the area.   In the ED, he reported persistence of the shortness of breath that he was experiencing over the final 2 days of his hospitalization at [facility] before leaving AMA.  The ED, O2 sat was 88% on room air which improved to 96% on 2 to 3 L by nasal cannula. Work-up showed WBC 9.1, creatinine 0.85, liver enzymes normal, lipase 40. Urinalysis was normal Chest x-ray showed a small right pleural effusion as well as findings consistent with chronic bronchitis. Patient was apparently admitted with suspicion of healthcare associated pneumonia.  Hospital Course:  Acute respiratory failure with hypoxia COPD exacerbation -Reports shortness of breath for 2 days prior to presentation during his last hospitalization. -Chest x-ray on this admission shows small right pleural effusion and chronic bronchitis.  No definite evidence of pneumonia. -Initially required oxygen supplementation by nasal cannula.  Later weaned off oxygen.  Currently not requiring oxygen at rest or ambulation. -Patient is a chronic smoker.  X-ray shows chronic bronchitis changes.  Likely has underlying COPD. -Currently improving with DuoNeb.  Not on steroids.  Acute cholecystitis -As described above, patient had percutaneous drainage placed on 11/7 which accidentally got pulled out and could  not be replaced next day because the gallbladder was collapsed. -Patient is not  septic in this presentation. WBC count normal.  Abdomen benign. -General surgery consultation obtained.  Right upper quadrant ultrasound was repeated which did not show any evidence of acute cholecystitis.  Gallstones noted.  Surgery recommended outpatient follow-up in the office. -Currently tolerating soft diet.  History of CAD status post stents -Patient underwent cardiac cath with stent placement by Dr. Saunders Revel in 2018. -Patient was supposed to be in aspirin, Brilinta, metoprolol. -Patient has not been compliant to his medications for more than a year he says.  I would restart him on a baby aspirin.  Unable to start on metoprolol because of blood pressure on the low end of normal.  His heart rate is mostly in 70s.  BPH  -continue Flomax.  Recent alcohol withdrawal symptoms -Patient adamantly denies using alcohol recently.  However, any last hospitalization, patient had withdrawal symptoms requiring ICU stay.  -No symptoms of alcohol withdrawal in this hospitalization.  Subjective:  Seen and examined this morning.  Pleasant elderly Caucasian male.  Not in physical distress.  Discharge Exam:   Vitals:   11/21/18 1444 11/21/18 1942 11/22/18 0649 11/22/18 0942  BP: 139/61 128/73 (!) 98/51   Pulse: 79 73 71   Resp: 19 20 18    Temp: 97.8 F (36.6 C) 98 F (36.7 C) (!) 97.5 F (36.4 C)   TempSrc: Oral  Oral   SpO2: 91% 95% 90% 91%  Weight:      Height:        Body mass index is 26.93 kg/m.  General exam: Appears calm and comfortable.  Skin: No rashes, lesions or ulcers. HEENT: Atraumatic, normocephalic, supple neck, no obvious bleeding Lungs: Clear to auscultation bilaterally CVS: Regular rate and rhythm, no murmur GI/Abd soft, mild tenderness present in the right upper quadrant, nondistended, bowel sound present CNS: Alert, awake, oriented x3 Psychiatry: Mood appropriate Extremities: No pedal edema, no calf tenderness  Discharge Instructions:  Wound care: None Discharge  Instructions    Diet - low sodium heart healthy   Complete by: As directed    Increase activity slowly   Complete by: As directed       Allergies as of 11/22/2018   No Known Allergies     Medication List    STOP taking these medications   methocarbamol 500 MG tablet Commonly known as: ROBAXIN   traMADol 50 MG tablet Commonly known as: ULTRAM   traZODone 100 MG tablet Commonly known as: DESYREL     TAKE these medications   amoxicillin-clavulanate 875-125 MG tablet Commonly known as: Augmentin Take 1 tablet by mouth every 12 (twelve) hours for 3 days.   feeding supplement (ENSURE ENLIVE) Liqd Take 237 mLs by mouth 2 (two) times daily between meals for 10 days.   guaiFENesin 600 MG 12 hr tablet Commonly known as: MUCINEX Take 1 tablet (600 mg total) by mouth 2 (two) times daily for 7 days.   tamsulosin 0.4 MG Caps capsule Commonly known as: FLOMAX Take 2 capsules (0.8 mg total) by mouth daily.   zolpidem 5 MG tablet Commonly known as: AMBIEN Take 1 tablet (5 mg total) by mouth at bedtime as needed for up to 7 days for sleep.       Time coordinating discharge: 35 minutes  The results of significant diagnostics from this hospitalization (including imaging, microbiology, ancillary and laboratory) are listed below for reference.    Procedures and Diagnostic Studies:   Dg Chest  2 View  Result Date: 11/19/2018 CLINICAL DATA:  Shortness of breath. EXAM: CHEST - 2 VIEW COMPARISON:  11/15/2018 FINDINGS: The cardiac silhouette, mediastinal and hilar contours are within normal limits and stable. Chronic bronchitic changes. There is a small right pleural effusion. IMPRESSION: Chronic bronchitic changes and small right effusion Electronically Signed   By: Marijo Sanes M.D.   On: 11/19/2018 16:29     Labs:   Basic Metabolic Panel: Recent Labs  Lab 11/16/18 0400 11/17/18 0303 11/19/18 1608 11/20/18 0524 11/22/18 0819  NA 133* 135 137 137 136  K 4.1 4.2 3.4* 3.5  3.5  CL 96* 97* 102 103 102  CO2 26 29 27 26 25   GLUCOSE 108* 92 97 84 90  BUN 11 18 16 12 9   CREATININE 0.97 1.03 0.85 0.88 0.77  CALCIUM 9.0 8.5* 8.3* 8.2* 8.4*  MG  --  2.4  --  2.1 2.2  PHOS  --  4.6  --  4.2 4.1   GFR Estimated Creatinine Clearance: 98.5 mL/min (by C-G formula based on SCr of 0.77 mg/dL). Liver Function Tests: Recent Labs  Lab 11/16/18 0400 11/17/18 0303 11/19/18 1608 11/20/18 0524 11/22/18 0819  AST 96* 33 38 33 21  ALT 111* 66* 47* 39 32  ALKPHOS 150* 112 92 80 73  BILITOT 3.6* 1.0 1.2 1.1 1.0  PROT 6.2* 5.4* 6.1* 5.4* 5.8*  ALBUMIN 2.8* 2.3* 3.2* 2.7* 2.9*   Recent Labs  Lab 11/19/18 1608  LIPASE 40   No results for input(s): AMMONIA in the last 168 hours. Coagulation profile No results for input(s): INR, PROTIME in the last 168 hours.  CBC: Recent Labs  Lab 11/16/18 0400 11/17/18 0303 11/19/18 1608 11/20/18 0524 11/22/18 0819  WBC 21.0* 11.7* 9.1 7.0 9.7  NEUTROABS  --   --   --  3.3  --   HGB 16.1 14.1 14.8 13.6 15.0  HCT 46.9 43.3 44.3 41.8 45.1  MCV 83.9 86.8 85.7 86.0 84.3  PLT 135* 111* 170 180 229   Cardiac Enzymes: No results for input(s): CKTOTAL, CKMB, CKMBINDEX, TROPONINI in the last 168 hours. BNP: Invalid input(s): POCBNP CBG: Recent Labs  Lab 11/17/18 1620  GLUCAP 130*   D-Dimer No results for input(s): DDIMER in the last 72 hours. Hgb A1c No results for input(s): HGBA1C in the last 72 hours. Lipid Profile No results for input(s): CHOL, HDL, LDLCALC, TRIG, CHOLHDL, LDLDIRECT in the last 72 hours. Thyroid function studies No results for input(s): TSH, T4TOTAL, T3FREE, THYROIDAB in the last 72 hours.  Invalid input(s): FREET3 Anemia work up No results for input(s): VITAMINB12, FOLATE, FERRITIN, TIBC, IRON, RETICCTPCT in the last 72 hours. Microbiology Recent Results (from the past 240 hour(s))  SARS CORONAVIRUS 2 (TAT 6-24 HRS) Nasopharyngeal Nasopharyngeal Swab     Status: None   Collection Time:  11/15/18  8:12 AM   Specimen: Nasopharyngeal Swab  Result Value Ref Range Status   SARS Coronavirus 2 NEGATIVE NEGATIVE Final    Comment: (NOTE) SARS-CoV-2 target nucleic acids are NOT DETECTED. The SARS-CoV-2 RNA is generally detectable in upper and lower respiratory specimens during the acute phase of infection. Negative results do not preclude SARS-CoV-2 infection, do not rule out co-infections with other pathogens, and should not be used as the sole basis for treatment or other patient management decisions. Negative results must be combined with clinical observations, patient history, and epidemiological information. The expected result is Negative. Fact Sheet for Patients: SugarRoll.be Fact Sheet for Healthcare Providers: https://www.woods-mathews.com/  This test is not yet approved or cleared by the [place] and  has been authorized for detection and/or diagnosis of SARS-CoV-2 by FDA under an Emergency Use Authorization (EUA). This EUA will remain  in effect (meaning this test can be used) for the duration of the COVID-19 declaration under Section 56 4(b)(1) of the Act, 21 U.S.C. section 360bbb-3(b)(1), unless the authorization is terminated or revoked sooner. Performed at [facility], [place] [address], [place], [place] [address]   Aerobic/Anaerobic Culture (surgical/deep wound)     Status: None   Collection Time: 11/16/18 12:02 PM   Specimen: PATH GI biopsy; Bile  Result Value Ref Range Status   Specimen Description GALL BLADDER  Final   Special Requests NONE  Final   Gram Stain NO WBC SEEN NO ORGANISMS SEEN   Final   Culture   Final    No growth aerobically or anaerobically. Performed at [facility], [place] [address] [place], [place] [address]    Report Status 11/21/2018 FINAL  Final  MRSA PCR Screening     Status: None   Collection Time: 11/17/18  4:01 PM   Specimen: Nasal Mucosa; Nasopharyngeal  Result  Value Ref Range Status   MRSA by PCR NEGATIVE NEGATIVE Final    Comment:        The GeneXpert MRSA Assay (FDA approved for NASAL specimens only), is one component of a comprehensive MRSA colonization surveillance program. It is not intended to diagnose MRSA infection nor to guide or monitor treatment for MRSA infections. Performed at [facility], [place] [address], [place], [place] [address]   Culture, blood (routine x 2)     Status: None (Preliminary result)   Collection Time: 11/19/18  8:17 PM   Specimen: BLOOD RIGHT HAND  Result Value Ref Range Status   Specimen Description   Final    BLOOD RIGHT HAND Performed at [place] [address] [place], [place] [address]    Special Requests   Final    BOTTLES DRAWN AEROBIC AND ANAEROBIC Blood Culture adequate volume Performed at [place] [address] [place], [place] [address]    Culture   Final    NO GROWTH 2 DAYS Performed at [facility] [address] [place], [place] [address]    Report Status PENDING  Incomplete  Culture, blood (routine x 2)     Status: None (Preliminary result)   Collection Time: 11/19/18  8:19 PM   Specimen: BLOOD  Result Value Ref Range Status   Specimen Description   Final    BLOOD RIGHT ANTECUBITAL Performed at [facility] [address] [place], [place] [address]    Special Requests   Final    BOTTLES DRAWN AEROBIC AND ANAEROBIC Blood Culture adequate volume Performed at [place] [address] [place], [place] [address]    Culture   Final    NO GROWTH 2 DAYS Performed at [facility] [address] [place], [place] [address]    Report Status PENDING  Incomplete  MRSA PCR Screening     Status: None   Collection Time: 11/19/18 11:30 PM   Specimen: Nasal Mucosa; Nasopharyngeal  Result Value Ref Range Status   MRSA by PCR NEGATIVE NEGATIVE Final    Comment:        The  GeneXpert MRSA Assay (FDA approved for NASAL specimens only), is one component of a comprehensive  MRSA colonization surveillance program. It is not intended to diagnose MRSA infection nor to guide or monitor treatment for MRSA infections. Performed at [facility], [place] [address], [place], [place] [address]   SARS CORONAVIRUS 2 (TAT 6-24 HRS) Nasopharyngeal Nasopharyngeal Swab     Status: None   Collection Time: 11/20/18  8:33 AM   Specimen: Nasopharyngeal Swab  Result Value Ref Range Status   SARS Coronavirus 2 NEGATIVE NEGATIVE Final    Comment: (NOTE) SARS-CoV-2 target nucleic acids are NOT DETECTED. The SARS-CoV-2 RNA is generally detectable in upper and lower respiratory specimens during the acute phase of infection. Negative results do not preclude SARS-CoV-2 infection, do not rule out co-infections with other pathogens, and should not be used as the sole basis for treatment or other patient management decisions. Negative results must be combined with clinical observations, patient history, and epidemiological information. The expected result is Negative. Fact Sheet for Patients: SugarRoll.be Fact Sheet for Healthcare Providers: https://www.woods-mathews.com/ This test is not yet approved or cleared by the [place] FDA and  has been authorized for detection and/or diagnosis of SARS-CoV-2 by FDA under an Emergency Use Authorization (EUA). This EUA will remain  in effect (meaning this test can be used) for the duration of the COVID-19 declaration under Section 56 4(b)(1) of the Act, 21 U.S.C. section 360bbb-3(b)(1), unless the authorization is terminated or revoked sooner. Performed at [facility], [place] [address] [place], [place] [address]     Please note: You were cared for by a hospitalist during your hospital stay. Once you are discharged, your primary care physician will handle any further  medical issues. Please note that NO REFILLS for any discharge medications will be authorized once you are discharged, as it is imperative that you return to your primary care physician (or establish a relationship with a primary care physician if you do not have one) for your post hospital discharge needs so that they can reassess your need for medications and monitor your lab values.  Signed: Terrilee Croak  [facility] 11/22/2018, 11:00 AM

## 2018-11-22 NOTE — Consult Note (Signed)
   [facility] CM Inpatient Consult   11/22/2018  David Hendricks [birth year] [id number]    Patient checked forpotential [place] Management services neededunder his Medicare/ [employer] ACO planwith a 7 day and 30 day readmissions, and has 22% high risk score for unplanned readmission with 2 hospitalizations and 3 ED visits in the past 6 months.   Review of patient's medical record and MD brief narrative reveal as: [name] a 68 y.o.malewith PMH of CAD s/p stent in proximal LAD, BPH, remote history of alcohol use, chronic smoker, probably has underlying COPD. On 11/10, post AMA (left against medical advice), patient's family reached out to his primary care provider asking for any antibiotic to take at home. PCP recommended to return back to the hospital. Patient presented to the ED on11/10/2020with shortness of breath. Admitted with suspicion of healthcare associated pneumonia.  Primary Care Provider isCody Zigmund Daniel, DO with [facility] at [facility].  Attempts to call patient but no response; mailbox full on his mobile phone and unable to leave message. He had transitionedto home prior to speaking with him. Chart review shows that patient lives at home alone.   Plan:Will follow with EMMI Pneumonia calls to follow-up post discharge.  Please place a [facility] Care Management consult as appropriate and for questions, please call:   Edwena Felty A. Terreon Ekholm, BSN, RN-BC [facility] Liaison Cell: [phone]

## 2018-11-22 NOTE — Progress Notes (Signed)
AVS given to patient and explained at the bedside. Medications and follow up appointments have been explained with pt verbalizing understanding.  

## 2018-11-22 NOTE — Progress Notes (Signed)
Patient currently sleeping. Off and on during the night, patient complained of pain. Patient had received Ambien, Tylenol, Robitussin, Robaxin, and Dilaudid and he felt that these did not help his pain much. Paged [facility] floor coverage Rufina Falco, NP) to notify about the patient still continuing to have trouble sleeping and have pain despite pharmacological and non-pharmacological interventions. Due to patient's history of alcohol use, I requested having CIWA's done. NP agreed.

## 2018-11-24 LAB — CULTURE, BLOOD (ROUTINE X 2)
Culture: NO GROWTH
Culture: NO GROWTH
Special Requests: ADEQUATE
Special Requests: ADEQUATE

## 2018-11-25 ENCOUNTER — Telehealth: Payer: Self-pay | Admitting: *Deleted

## 2018-11-25 ENCOUNTER — Other Ambulatory Visit: Payer: Self-pay | Admitting: *Deleted

## 2018-11-25 NOTE — Patient Outreach (Signed)
Red EMMI flag received for Day #1 (11/23/18)- Filled new prescriptions? No- outreach call to pt to address, spoke with pt, HIPAA verified, pt reports he does not have tamsulosin and has all other prescriptions and states " not sure why I didn't get this one"  RN CM called [facility] at [place]/ [address], spoke with pharmacist who reports he does have the 30 day prescription to fill and will get this ready for pt, pharmacist states pt had wanted a 12 day supply and MD did not send prescription for this and pt may need to follow up with MD visit for this.  RN CM called pt back and let him know the above information, pt states he will pickup prescription today.  Jacqlyn Larsen [facility], [place] Coordinator [phone]

## 2018-11-25 NOTE — Telephone Encounter (Signed)
Unable to reach pt. Hospital follow up appt scheduled 11/27/18.

## 2018-11-26 ENCOUNTER — Other Ambulatory Visit: Payer: Self-pay | Admitting: *Deleted

## 2018-11-26 ENCOUNTER — Telehealth: Payer: Self-pay

## 2018-11-26 NOTE — Patient Outreach (Signed)
Received 2 Red EMMI pneumonia flags for 11/25/18- Feeling better overall- no, diarrhea or felt sick- yes. Outreach call to pt to address and no answer to telephone, no option to leave voicemail,  Mailed unsuccessful outreach letter to pt home.  PLAN Outreach pt in 3-4 business days  Jacqlyn Larsen [facility], [place] [phone]

## 2018-11-26 NOTE — Telephone Encounter (Signed)
Unable to reach pt x2.

## 2018-11-26 NOTE — Telephone Encounter (Signed)
Questions for Screening COVID-19  Symptom onset: Diarrhea from antibiotics.  Travel or Contacts: No  During this illness, did/does the patient experience any of the following symptoms? Fever >100.71F []   Yes [x]   No []   Unknown Subjective fever (felt feverish) []   Yes [x]   No []   Unknown Chills []   Yes [x]   No []   Unknown Muscle aches (myalgia) []   Yes [x]   No []   Unknown Runny nose (rhinorrhea) []   Yes [x]   No []   Unknown Sore throat []   Yes [x]   No []   Unknown Cough (new onset or worsening of chronic cough) []   Yes [x]   No []   Unknown Shortness of breath (dyspnea) []   Yes [x]   No []   Unknown Nausea or vomiting []   Yes [x]   No []   Unknown Headache []   Yes []   No [x]   Unknown Abdominal pain  []   Yes [x]   No []   Unknown Diarrhea (?3 loose/looser than normal stools/24hr period) [x]   Yes []   No []   Unknown

## 2018-11-27 ENCOUNTER — Ambulatory Visit (INDEPENDENT_AMBULATORY_CARE_PROVIDER_SITE_OTHER): Payer: Medicare Other | Admitting: Family Medicine

## 2018-11-27 ENCOUNTER — Other Ambulatory Visit: Payer: Self-pay

## 2018-11-27 ENCOUNTER — Other Ambulatory Visit: Payer: Self-pay | Admitting: *Deleted

## 2018-11-27 ENCOUNTER — Encounter: Payer: Self-pay | Admitting: Family Medicine

## 2018-11-27 VITALS — BP 112/58 | HR 100 | Temp 98.6°F | Ht 73.0 in | Wt 202.4 lb

## 2018-11-27 DIAGNOSIS — Z720 Tobacco use: Secondary | ICD-10-CM | POA: Diagnosis not present

## 2018-11-27 DIAGNOSIS — K819 Cholecystitis, unspecified: Secondary | ICD-10-CM | POA: Diagnosis not present

## 2018-11-27 DIAGNOSIS — E049 Nontoxic goiter, unspecified: Secondary | ICD-10-CM

## 2018-11-27 DIAGNOSIS — J189 Pneumonia, unspecified organism: Secondary | ICD-10-CM | POA: Diagnosis not present

## 2018-11-27 DIAGNOSIS — K228 Other specified diseases of esophagus: Secondary | ICD-10-CM

## 2018-11-27 DIAGNOSIS — K2289 Other specified disease of esophagus: Secondary | ICD-10-CM

## 2018-11-27 DIAGNOSIS — R918 Other nonspecific abnormal finding of lung field: Secondary | ICD-10-CM | POA: Diagnosis not present

## 2018-11-27 DIAGNOSIS — F17210 Nicotine dependence, cigarettes, uncomplicated: Secondary | ICD-10-CM | POA: Diagnosis not present

## 2018-11-27 DIAGNOSIS — F320 Major depressive disorder, single episode, mild: Secondary | ICD-10-CM | POA: Diagnosis not present

## 2018-11-27 MED ORDER — PANTOPRAZOLE SODIUM 40 MG PO TBEC: 40.0000 mg | DELAYED_RELEASE_TABLET | Freq: Every day | ORAL | 3 refills | Status: DC

## 2018-11-27 NOTE — Assessment & Plan Note (Signed)
Referral entered to psychiatry per his request.

## 2018-11-27 NOTE — Progress Notes (Signed)
David Hendricks - 68 y.o. male MRN [id number]  Date of birth: 07-15-1950  Subjective Chief Complaint  Patient presents with  . Follow-up    hospital follow discharged 4 days ago.Pt said pain is better but he feels tired now.    HPI David Hendricks is a 68 y.o. with history of CAD, nicotine dependence, and insomnia here today for hospital follow up.  Prior to hospitalization he had multiple ER visits for abdominal and back pain as well as video visit with me.  During that time he had imaging including [place] of gallbladder and CT of abdomen.  [place] with GB sludge without evidence of cholecystitis.  WBC mildly elevated on labwork.  He continued to have worsening pain and was seen in ED again on 11/15/2018 and LFTs were trending up and noted to have gallstones on [place].  Surgery was consulted and percutaneuous chole drain was placed by IR.  He was started on IV antibiotics.  CXR also showed possible RUL airspace disease concerning for pneumonia.  Given several recent trips to ED he was given antibiotic coverage for HCAP.  CT angio was negative for PE.  He did become increasingly agitated during his admission and was transferred to ICU for possible EtOH withdrawal along with his acute hypoxic respiratory failure. He was started on precedex and bipap.  He adamantly denies EtOH use and ultimately left AMA.  Within 24 hours he returned for readmission due to increased shortness of breath and restarted on IV antibiotics.  He was subsequently d/c'ed home with augmentin and instructions to f/u with PCP and Gen surgery.  Today he reports that he is doing "ok".  He states he feels tired.  He has some epigastric pain, worse after eating.  Describes this as a burning sensation.  He was noted to have some esophageal thickening on recent chest CT.  He denies difficulty swallowing.  He denies increased shortness of breath.  He denies RUQ pain at this time.  He does not have have f/u with surgery that he is aware of.   He does also request  referral to psychiatry for mild depression and insomnia.  He denies SI at this time.   ROS:  A comprehensive ROS was completed and negative except as noted per HPI  No Known Allergies  Past Medical History:  Diagnosis Date  . Anxiety   . Arthritis   . Coronary artery disease    10/18 PCI/DES to pLAD, mild nonobstructive disease in the Lcx/RCA. Normal EF.   Marland Kitchen Dysrhythmia 2016   irregular heartbeat  . Myocardial infarction ([place])   . Seasonal allergies     Past Surgical History:  Procedure Laterality Date  . CORONARY ANGIOPLASTY  10/09/2016  . CORONARY STENT INTERVENTION N/A 10/09/2016   Procedure: CORONARY STENT INTERVENTION;  Surgeon: Nelva Bush, MD;  Location: [facility];  Service: Cardiovascular;  Laterality: N/A;  . INTRAVASCULAR ULTRASOUND/IVUS N/A 10/09/2016   Procedure: Intravascular Ultrasound/IVUS;  Surgeon: Nelva Bush, MD;  Location: [facility];  Service: Cardiovascular;  Laterality: N/A;  . IR CHOLANGIOGRAM EXISTING TUBE  11/18/2018  . IR PERC CHOLECYSTOSTOMY  11/16/2018  . LEFT HEART CATH AND CORONARY ANGIOGRAPHY N/A 10/09/2016   Procedure: LEFT HEART CATH AND CORONARY ANGIOGRAPHY;  Surgeon: Nelva Bush, MD;  Location: [facility];  Service: Cardiovascular;  Laterality: N/A;    Social History   Socioeconomic History  . Marital status: Divorced    Spouse name: Not on file  . Number of children:  Not on file  . Years of education: Not on file  . Highest education level: Not on file  Occupational History  . Not on file  Social Needs  . Financial resource strain: Not on file  . Food insecurity    Worry: Not on file    Inability: Not on file  . Transportation needs    Medical: Not on file    Non-medical: Not on file  Tobacco Use  . Smoking status: Current Every Day Smoker    Packs/day: 2.00    Years: 45.00    Pack years: 90.00    Types: Cigarettes    Start date: 01/09/1961  . Smokeless tobacco: Never Used  . Tobacco  comment: thinking about after settling in   Substance and Sexual Activity  . Alcohol use: No  . Drug use: No  . Sexual activity: Not on file  Lifestyle  . Physical activity    Days per week: Not on file    Minutes per session: Not on file  . Stress: Not on file  Relationships  . Social Herbalist on phone: Not on file    Gets together: Not on file    Attends religious service: Not on file    Active member of club or organization: Not on file    Attends meetings of clubs or organizations: Not on file    Relationship status: Not on file  Other Topics Concern  . Not on file  Social History Narrative  . Not on file    Family History  Problem Relation Age of Onset  . Cancer Father     Health Maintenance  Topic Date Due  . Hepatitis C Screening  11-28-1950  . TETANUS/TDAP  05/29/1968  . PNA vac Low Risk Adult (2 of 2 - PPSV23) 05/11/2018  . INFLUENZA VACCINE  08/10/2018  . COLONOSCOPY  04/20/2026    ----------------------------------------------------------------------------------------------------------------------------------------------------------------------------------------------------------------- Physical Exam Temp 98.6 F (37 C) (Temporal)   Ht 6\' 1"  (1.854 m)   Wt 202 lb 6.4 oz (91.8 kg)   BMI 26.70 kg/m   Physical Exam Constitutional:      Appearance: Normal appearance.  HENT:     Head: Normocephalic and atraumatic.     Mouth/Throat:     Mouth: Mucous membranes are moist.  Eyes:     General: No scleral icterus. Neck:     Musculoskeletal: Neck supple.  Cardiovascular:     Rate and Rhythm: Normal rate and regular rhythm.  Pulmonary:     Effort: Pulmonary effort is normal.     Breath sounds: Normal breath sounds.  Abdominal:     General: Abdomen is flat. There is no distension.     Palpations: Abdomen is soft.     Tenderness: There is abdominal tenderness (mild epigastric tenderness). There is no guarding or rebound.  Musculoskeletal:      Right lower leg: No edema.     Left lower leg: No edema.  Lymphadenopathy:     Cervical: No cervical adenopathy.  Skin:    General: Skin is warm and dry.  Neurological:     General: No focal deficit present.     Mental Status: He is alert.  Psychiatric:        Mood and Affect: Mood normal.        Behavior: Behavior normal.     ------------------------------------------------------------------------------------------------------------------------------------------------------------------------------------------------------------------- Assessment and Plan  HCAP (healthcare-associated pneumonia) -Completed course of IV antibiotics and has completed course of augmentin that he was discharged home  on.  -Denies increased shortness of breath.  strongly recommend he quit smoking.   Esophageal thickening -Start protonix and referral to GI placed.   Cholecystitis -S/p perc drainage of gallbladder.  Stable at this time but will need elective cholescystectomy, surgical referral ordered for f/u.   -Update CMP/CBC today.   Goiter -TSH and thyroid US ordered.   Cigarette nicotine dependence without complication Counseled and strongly recommended smoking cessation.   Current mild episode of major depressive disorder without prior episode [facility]) Referral entered to psychiatry per his request.   Pulmonary nodules/lesions, multiple Referral to pulmonology.

## 2018-11-27 NOTE — Assessment & Plan Note (Signed)
-  Completed course of IV antibiotics and has completed course of augmentin that he was discharged home on.  -Denies increased shortness of breath.  strongly recommend he quit smoking.

## 2018-11-27 NOTE — Patient Outreach (Signed)
Red EMMI pneumonia flag received for 11/17/120 Day #4, Sleeping better than when in hospital? - no.  Outreach call to pt to address issue, per pt, he has had sleep issues for awhile although he gets about nine hours sleep per night, he wakes up and stays awake approximately one hour per night, pt states " I slept almost all day yesterday" as pneumonia has been difficult "and I'm tired a lot"   RN CM talked with pt about how taking long naps can disrupt night time sleep schedule, pt states he will discuss with primary MD at appointment this afternoon.    Jacqlyn Larsen [facility], [place] Coordinator [phone]

## 2018-11-27 NOTE — Assessment & Plan Note (Signed)
-  S/p perc drainage of gallbladder.  Stable at this time but will need elective cholescystectomy, surgical referral ordered for f/u.   -Update CMP/CBC today.

## 2018-11-27 NOTE — Assessment & Plan Note (Signed)
-  Start protonix and referral to GI placed.

## 2018-11-27 NOTE — Patient Instructions (Addendum)
I have entered referrals for specialist follow up including surgery-gallbladder, GI-Stomach issues, and Lung specialist-Lung nodules.  They will call you to schedule an appt.   -Start protonix for acid reflux  -I strongly recommend that you quit smoking.   -Your thyroid gland was enlarged on recent CT scan of your chest, I have ordered an ultrasound to follow up on this.

## 2018-11-27 NOTE — Assessment & Plan Note (Signed)
Referral to pulmonology

## 2018-11-27 NOTE — Assessment & Plan Note (Signed)
-  TSH and thyroid US ordered.

## 2018-11-27 NOTE — Assessment & Plan Note (Signed)
Counseled and strongly recommended smoking cessation.

## 2018-11-28 ENCOUNTER — Telehealth: Payer: Self-pay

## 2018-11-28 ENCOUNTER — Other Ambulatory Visit: Payer: Self-pay

## 2018-11-28 LAB — COMPREHENSIVE METABOLIC PANEL
ALT: 18 U/L (ref 0–53)
AST: 13 U/L (ref 0–37)
Albumin: 3.9 g/dL (ref 3.5–5.2)
Alkaline Phosphatase: 89 U/L (ref 39–117)
BUN: 11 mg/dL (ref 6–23)
CO2: 29 mEq/L (ref 19–32)
Calcium: 9.2 mg/dL (ref 8.4–10.5)
Chloride: 101 mEq/L (ref 96–112)
Creatinine, Ser: 0.95 mg/dL (ref 0.40–1.50)
GFR: 78.49 mL/min (ref >60.00)
Glucose, Bld: 106 mg/dL — ABNORMAL HIGH (ref 70–99)
Potassium: 4.4 mEq/L (ref 3.5–5.1)
Sodium: 139 mEq/L (ref 135–145)
Total Bilirubin: 0.6 mg/dL (ref 0.2–1.2)
Total Protein: 6.2 g/dL (ref 6.0–8.3)

## 2018-11-28 LAB — CBC
HCT: 48.8 % (ref 39.0–52.0)
Hemoglobin: 16.2 g/dL (ref 13.0–17.0)
MCHC: 33.3 g/dL (ref 30.0–36.0)
MCV: 85.1 fl (ref 78.0–100.0)
Platelets: 260 10*3/uL (ref 150.0–400.0)
RBC: 5.73 Mil/uL (ref 4.22–5.81)
RDW: 13.7 % (ref 11.5–15.5)
WBC: 8.6 10*3/uL (ref 4.0–10.5)

## 2018-11-28 LAB — TSH: TSH: 0.13 u[IU]/mL — ABNORMAL LOW (ref 0.35–4.50)

## 2018-11-28 NOTE — Patient Outreach (Addendum)
Red Emmi: Red Emmi alert from 11/25/2018:   Feeling sick to stomach/diarrhea; and not feeling well. Red Emmi Alert from 11/27/2018 Not eating on a regular basis.  Placed call to pateint and reviewed reason for call. Patient reports that he ate some pre made biscuits and they made him sick. Reports had diarrhea but now it has stopped. Reports he saw his primary MD yesterday and received referrals to many other doctors... reviewed and confirmed with patient referral were placed in Epic.  Encouraged patient to accept calls and appointment and test would be scheduled and the MD office would call him.  Patient reports that his breathing in good and feels he is recovering from his pneumonia.  Patient voiced understanding and denies any other questions at this time.  PLAN: case closure. Questions and concerns answered.  Tomasa Rand, RN, BSN, CEN [facility] [employer] [phone]

## 2018-11-28 NOTE — Telephone Encounter (Signed)
Copied from [place] [id number]. Topic: General - Inquiry >> Nov 28, 2018 12:48 PM Virl Axe D wrote: Reason for CRM: Pt stated he wanted to speak with Dr. Zigmund Daniel' CMA. He stated he needs a nurse visit and has severe diarrhea. Pt was just seen yesterday by Dr. Zigmund Daniel. Please advise.

## 2018-11-29 ENCOUNTER — Other Ambulatory Visit: Payer: Self-pay

## 2018-11-29 ENCOUNTER — Ambulatory Visit: Payer: Self-pay | Admitting: *Deleted

## 2018-11-29 ENCOUNTER — Other Ambulatory Visit: Payer: Self-pay | Admitting: Family Medicine

## 2018-11-29 ENCOUNTER — Telehealth: Payer: Self-pay | Admitting: Family Medicine

## 2018-11-29 DIAGNOSIS — R7989 Other specified abnormal findings of blood chemistry: Secondary | ICD-10-CM

## 2018-11-29 DIAGNOSIS — E049 Nontoxic goiter, unspecified: Secondary | ICD-10-CM

## 2018-11-29 MED ORDER — ZOLPIDEM TARTRATE 5 MG PO TABS: 5.0000 mg | ORAL_TABLET | Freq: Every evening | ORAL | 0 refills | Status: DC | PRN

## 2018-11-29 NOTE — Telephone Encounter (Signed)
Questions for Screening COVID-19    Travel or Contacts: No  During this illness, did/does the patient experience any of the following symptoms? Fever >100.4F []  Yes [x]  No []  Unknown Subjective fever (felt feverish) []  Yes [x]  No []  Unknown Chills []  Yes [x]  No []  Unknown Muscle aches (myalgia) []  Yes [x]  No []  Unknown Runny nose (rhinorrhea) []  Yes [x]  No []  Unknown Sore throat []  Yes [x]  No []  Unknown Cough (new onset or worsening of chronic cough) []  Yes [x]  No []  Unknown Shortness of breath (dyspnea) []  Yes [x]  No []  Unknown Nausea or vomiting []  Yes [x]  No []  Unknown Headache []  Yes [x]  No []  Unknown Abdominal pain  []  Yes [x]  No []  Unknown Diarrhea (?3 loose/looser than normal stools/24hr period) []  Yes [x]  No []  Unknown    

## 2018-11-29 NOTE — Progress Notes (Signed)
-  One of his thyroid tests is abnormal.  We need to check additional labs to see if his thyroid gland is functioning properly. Please schedule lab visit.  Future orders entered.  -Other labs are normal.

## 2018-11-29 NOTE — Telephone Encounter (Signed)
rx refill zolpidem (AMBIEN) 5 MG tablet PHARMACY [facility] [id number] - [address], [place] AT [facility] [address] [phone] (Phone) [phone] (Fax)

## 2018-11-29 NOTE — Patient Outreach (Signed)
Red Emmi alert: New referral for red emmi Reason for alert: smoker-yes                             Feeling better overall-no                             Diarrhea- yes  Spoke with patient on 11/28/2018 prior to this red emmi call on same day.  Patient admitted to diarrhea but felt like it was eating a bad biscuit.  Placed call to patient to with no answer.  Mail box so unable to leave a message.  PLAN: will call back on next business day.  Tomasa Rand, RN, BSN, CEN [facility] [employer] [phone]

## 2018-11-29 NOTE — Telephone Encounter (Signed)
How many loose stools per day is he having?  Referral entered for pulmonology to follow lung nodules.  He doesn't really meet criteria for a home health aide.

## 2018-11-29 NOTE — Telephone Encounter (Signed)
Refilled

## 2018-12-02 ENCOUNTER — Other Ambulatory Visit (INDEPENDENT_AMBULATORY_CARE_PROVIDER_SITE_OTHER): Payer: Medicare Other

## 2018-12-02 ENCOUNTER — Other Ambulatory Visit: Payer: Self-pay

## 2018-12-02 ENCOUNTER — Encounter: Payer: Self-pay | Admitting: Gastroenterology

## 2018-12-02 ENCOUNTER — Ambulatory Visit (INDEPENDENT_AMBULATORY_CARE_PROVIDER_SITE_OTHER): Payer: Medicare Other | Admitting: Gastroenterology

## 2018-12-02 VITALS — BP 118/60 | HR 92 | Temp 98.4°F | Ht 73.0 in | Wt 200.0 lb

## 2018-12-02 DIAGNOSIS — R7989 Other specified abnormal findings of blood chemistry: Secondary | ICD-10-CM | POA: Diagnosis not present

## 2018-12-02 DIAGNOSIS — E049 Nontoxic goiter, unspecified: Secondary | ICD-10-CM

## 2018-12-02 DIAGNOSIS — R933 Abnormal findings on diagnostic imaging of other parts of digestive tract: Secondary | ICD-10-CM

## 2018-12-02 DIAGNOSIS — Z1211 Encounter for screening for malignant neoplasm of colon: Secondary | ICD-10-CM

## 2018-12-02 LAB — T4, FREE: Free T4: 1.09 ng/dL (ref 0.60–1.60)

## 2018-12-02 NOTE — Progress Notes (Signed)
Referring Provider: [name], DO Primary Care Physician:  [name], DO  Reason for Consultation: Esophageal thickening   IMPRESSION:  Distal esophageal thickening on CT 11/15/18 Recent acute cholecystitis s/p per chole Occasional odynophagia and nausea Unintentional weight loss of 12 pounds Colonoscopy 5 years ago in [place]    - poor prep    - patient understood that he needed another procedure in 2-3 years  EGD recommended to evaluate the distal esophageal abnormal on CT scan that may suggest reflux esophagitis, infectious esophagitis, and malignancy. Trial of PPI in the meantime.   Colonoscopy recommended for colon cancer screening.   The patient declined to schedule endoscopy today. He wishes to wait until he feels better.   PLAN: Resume pantoprazole 40 mg daily EGD Colonoscopy  Please see the "Patient Instructions" section for addition details about the plan.  HPI: David Hendricks is a 68 y.o. male with coronary artery disease, nicotine dependence, and insomnia. 68 year old gentleman with a history of coronary artery disease status post PCI, 90-pack-year smoking history, and recent hospital stay for acute cholecystitis who presents for new patient evaluation.  Recent complicated hospital stay - acute cholecystitis s/p percutaneous drain placement, and treatment for HCAP.  He has surgery follow-up 12/16/18. Had a CT study during this admission which showed pulmonary nodules, prominent asymmetric goiter, and a thickened distal esophagus. The radiologist suggested the possibility of "reflux esophagitis, Barrett's esophagus, or esophageal neoplasm."  He presents today for evaluation of the CT findings.  Frequent regurgitation.  Sensitive to hot and cold liquids at the xiphoid process that resolves within 20-30 seconds. No dysphagia or odynophagia. Poor energy.  Frequent nausea when supine.   Stopped using his dentures as they no longer fit after weight changes.  Has lost 12 pounds since his symptoms started.  Previously used pantoprazole. No other associated symptoms. No identified exacerbating or relieving features.   Colonoscopy in [place] 5 years ago. Due to poor prep they recommended that he repeat the colonoscopy in a couple of years.   No known family history of colon cancer or polyps. No family history of uterine/endometrial cancer, pancreatic cancer or gastric/stomach cancer.   Past Medical History:  Diagnosis Date  . Anxiety   . Arthritis   . Coronary artery disease    10/18 PCI/DES to pLAD, mild nonobstructive disease in the Lcx/RCA. Normal EF.   Marland Kitchen Dysrhythmia 2016   irregular heartbeat  . Myocardial infarction ([place])   . Seasonal allergies     Past Surgical History:  Procedure Laterality Date  . CORONARY ANGIOPLASTY  10/09/2016  . CORONARY STENT INTERVENTION N/A 10/09/2016   Procedure: CORONARY STENT INTERVENTION;  Surgeon: Nelva Bush, MD;  Location: [facility];  Service: Cardiovascular;  Laterality: N/A;  . INTRAVASCULAR ULTRASOUND/IVUS N/A 10/09/2016   Procedure: Intravascular Ultrasound/IVUS;  Surgeon: Nelva Bush, MD;  Location: [place] [facility];  Service: Cardiovascular;  Laterality: N/A;  . IR CHOLANGIOGRAM EXISTING TUBE  11/18/2018  . IR PERC CHOLECYSTOSTOMY  11/16/2018  . LEFT HEART CATH AND CORONARY ANGIOGRAPHY N/A 10/09/2016   Procedure: LEFT HEART CATH AND CORONARY ANGIOGRAPHY;  Surgeon: Nelva Bush, MD;  Location: [place] [facility];  Service: Cardiovascular;  Laterality: N/A;    Current Outpatient Medications  Medication Sig Dispense Refill  . pantoprazole (PROTONIX) 40 MG tablet Take 1 tablet (40 mg total) by mouth daily. 30 tablet 3  . tamsulosin (FLOMAX) 0.4 MG CAPS capsule Take 2 capsules (0.8 mg total) by mouth daily. 60 capsule 0  .  zolpidem (AMBIEN) 5 MG tablet Take 1 tablet (5 mg total) by mouth at bedtime as needed for up to 14 days for sleep. 14 tablet 0   No current  facility-administered medications for this visit.     Allergies as of 12/02/2018  . (No Known Allergies)    Family History  Problem Relation Age of Onset  . Cancer Father     Social History   Socioeconomic History  . Marital status: Divorced    Spouse name: Not on file  . Number of children: Not on file  . Years of education: Not on file  . Highest education level: Not on file  Occupational History  . Not on file  Social Needs  . Financial resource strain: Not on file  . Food insecurity    Worry: Not on file    Inability: Not on file  . Transportation needs    Medical: Not on file    Non-medical: Not on file  Tobacco Use  . Smoking status: Current Every Day Smoker    Packs/day: 2.00    Years: 45.00    Pack years: 90.00    Types: Cigarettes    Start date: 01/09/1961  . Smokeless tobacco: Never Used  . Tobacco comment: thinking about after settling in   Substance and Sexual Activity  . Alcohol use: No  . Drug use: No  . Sexual activity: Not on file  Lifestyle  . Physical activity    Days per week: Not on file    Minutes per session: Not on file  . Stress: Not on file  Relationships  . Social Herbalist on phone: Not on file    Gets together: Not on file    Attends religious service: Not on file    Active member of club or organization: Not on file    Attends meetings of clubs or organizations: Not on file    Relationship status: Not on file  . Intimate partner violence    Fear of current or ex partner: Not on file    Emotionally abused: Not on file    Physically abused: Not on file    Forced sexual activity: Not on file  Other Topics Concern  . Not on file  Social History Narrative  . Not on file    Review of Systems: 12 system ROS is negative except as noted above.   Physical Exam: General:   Alert,  well-nourished, pleasant and cooperative in NAD Head:  Normocephalic and atraumatic. Eyes:  Sclera clear, no icterus.   Conjunctiva pink.  Ears:  Normal auditory acuity. Nose:  No deformity, discharge,  or lesions. Mouth:  No deformity or lesions.   Neck:  Supple; no masses or thyromegaly. Lungs:  Clear throughout to auscultation.   No wheezes. Heart:  Regular rate and rhythm; no murmurs. Abdomen:  Soft,nontender, nondistended, normal bowel sounds, no rebound or guarding. No hepatosplenomegaly.   Rectal:  Deferred  Msk:  Symmetrical. No boney deformities LAD: No inguinal or umbilical LAD Extremities:  No clubbing or edema. Neurologic:  Alert and  oriented x4;  grossly nonfocal Skin:  Intact without significant lesions or rashes. Psych:  Alert and cooperative. Normal mood and affect.    Tamaka Sawin L. Tarri Glenn, MD, MPH 12/02/2018, 3:25 PM

## 2018-12-02 NOTE — Discharge Summary (Signed)
Physician Discharge Summary  Patient ID: David Hendricks MRN: [id number] DOB/AGE: 1950/02/21 68 y.o.  Admit date: 11/15/2018 Discharge date: 12/02/2018  Admission Diagnoses:  Discharge Diagnoses:  Principal Problem:   Cholecystitis Active Problems:   Benign prostatic hyperplasia with urinary frequency   CAD (coronary artery disease)   Cigarette nicotine dependence without complication   Goiter   Pulmonary nodules/lesions, multiple   Esophageal thickening   Discharged Condition: [facility]  Hospital Course:  David Hendricks is a 68 year old male with CAD s/p PCI admitted for cholecystitis. Underwent placement of percutaneous cholecystostomy tube on 11/16/18 by IR. He was also treated with antibiotics for cholecystitis and possible penumonia. Overnight on 11/17/18, patient became altered and received multiple doses of sedation requiring ICU transfer for Precedex gtt. Percutaneous cholecystostomy tube was dislodged during agitation. Precedex gtt was weaned off. IR re-consulted for replacement of cholecystostomy drain however unable to replace as gallbladder was completely collapsed on ultrasound. Drain was removed with plan to follow clinically and to consider repeat imaging if indicated. On 11/19/18 at 4:45 patient signed AMA paperwork and left ICU.  Consults: Interventional Radiology  Significant Diagnostic Studies:  US Abdomen 11/15/18  Extensive sludge throughout the gallbladder with tiny intermingled gallstones. There is gallbladder wall thickening and pericholecystic fluid. Patient is focally tender over the gallbladder. These are findings indicative of acute cholecystitis.  CTA 11/15/18 1. No pulmonary embolism. 2. Mild gallbladder wall thickening and pericholecystic fat stranding in the visualized upper abdomen, compatible with reported acute cholecystitis on abdominal sonogram study from earlier today. 3. Scattered solid pulmonary nodules, largest 7 mm in the lingula. Non-contrast chest  CT at 3-6 months is recommended. If the nodules are stable at time of repeat CT, then future CT at 18-24 months (from today's scan) is considered optional for low-risk patients, but is recommended for high-risk patients. This recommendation follows the consensus statement: Guidelines for Management of Incidental Pulmonary Nodules Detected on CT Images: From the Fleischner Society 2017; Radiology 2017; 284:228-243. 4. Nonspecific circumferential wall thickening in the lower thoracic esophagus. While most commonly due to reflux esophagitis, Barrett's esophagus or esophageal neoplasm cannot be excluded by CT. GI consultation suggested. 5. Prominent goiter, asymmetric to the left. Thyroid ultrasound evaluation suggested on a short term outpatient basis.  Treatments: IV hydration, antibiotics: Unasyn and procedures: percutaneous drainage catheter placement  Disposition: AMA   Allergies as of 11/19/2018   No Known Allergies     Medication List    You have not been prescribed any medications.      Signed: Chi Rodman Pickle 12/02/2018, 4:35 PM

## 2018-12-02 NOTE — Patient Instructions (Addendum)
I have recommended an upper endoscopy and colonoscopy to evaluate your abnormal CT scan and to follow-up on your last colonoscopy. Please call me when you are ready to schedule these procedures. In the meantime, continue to take pantoprazole 40 mg daily ideally taken 30-60 minutes prior to breakfast.   If you are age 69 or older, your body mass index should be between 23-30. Your Body mass index is 26.39 kg/m. If this is out of the aforementioned range listed, please consider follow up with your Primary Care Provider.  If you are age 43 or younger, your body mass index should be between 19-25. Your Body mass index is 26.39 kg/m. If this is out of the aformentioned range listed, please consider follow up with your Primary Care Provider.

## 2018-12-02 NOTE — Patient Outreach (Signed)
Emmi: Reason for alert: 11/28/2018  Alert reason: diarrhea Reason for alert on 11/30/2018   Being confused.   Placed call to patient who reports he no longer has diarrhea. Reports feeling better. Denies confusion and reports he answered the question wrong.  Denies any new needs or concerns today.  PLAN: Case closure  Tomasa Rand, RN, BSN, [facility] [employer] [phone]

## 2018-12-03 ENCOUNTER — Ambulatory Visit (INDEPENDENT_AMBULATORY_CARE_PROVIDER_SITE_OTHER): Payer: Medicare Other

## 2018-12-03 ENCOUNTER — Other Ambulatory Visit: Payer: Self-pay | Admitting: *Deleted

## 2018-12-03 DIAGNOSIS — E538 Deficiency of other specified B group vitamins: Secondary | ICD-10-CM | POA: Diagnosis not present

## 2018-12-03 MED ORDER — CYANOCOBALAMIN 1000 MCG/ML IJ SOLN
1000.0000 ug | Freq: Once | INTRAMUSCULAR | Status: AC
  Administered 2018-12-03: 1000 ug via INTRAMUSCULAR

## 2018-12-03 NOTE — Patient Outreach (Signed)
Red EMMI flag pneumonia received for 12/02/18 day #6 for smoker- yes.  Outreach call to pt to address and pt states " yes I am a smoker, 2 ppd and I'm not considering quitting right now"  Pt states he may revisit the idea of quitting after the holidays and will contact RN CM for resources at that time if he decides.  RN CM offered resources today for smoking cessation and pt declined.  RN CM educated pt on detrimental effects of smoking on his health.  Jacqlyn Larsen [facility], [place] Coordinator [phone]

## 2018-12-03 NOTE — Patient Instructions (Signed)
Health Maintenance Due  Topic Date Due  . Hepatitis C Screening  [birth year]  . TETANUS/TDAP  05/29/1968  . PNA vac Low Risk Adult (2 of 2 - PPSV23) 05/11/2018    Depression screen [facility] 2/9 10/30/2018 06/21/2017 05/10/2017  Decreased Interest 0 2 0  Down, Depressed, Hopeless 0 0 0  PHQ - 2 Score 0 2 0  Altered sleeping - 3 -  Tired, decreased energy - 3 -  Change in appetite - 0 -  Feeling bad or failure about yourself  - 3 -  Trouble concentrating - 0 -  Moving slowly or fidgety/restless - 0 -  Suicidal thoughts - 0 -  PHQ-9 Score - 11 -

## 2018-12-03 NOTE — Progress Notes (Signed)
Per orders of Dr. Zigmund Daniel,  injection of B12 given by Verline Lema L Tyus in right Ventrogluteal. Patient tolerated injection well. Patient will make appointment for 1 month.

## 2018-12-09 ENCOUNTER — Ambulatory Visit (INDEPENDENT_AMBULATORY_CARE_PROVIDER_SITE_OTHER): Payer: Medicare Other | Admitting: Psychology

## 2018-12-09 ENCOUNTER — Other Ambulatory Visit: Payer: Self-pay | Admitting: *Deleted

## 2018-12-09 DIAGNOSIS — F411 Generalized anxiety disorder: Secondary | ICD-10-CM | POA: Diagnosis not present

## 2018-12-09 NOTE — Patient Outreach (Signed)
Red EMMI pneumonia received for 12/05/18 Day # 9 for " Back to pre-sick activity level"- no.  Outreach call to pt for follow up and pt reports " I just stay tired, no energy but I'm no worse"  Pt states he has appointment with pulmonary this week on 12/11/18 and will discuss with MD then, pt also has appointment for imaging on 12/10/18.  No other concerns voiced.  Jacqlyn Larsen [facility], [place] Coordinator [phone]

## 2018-12-10 ENCOUNTER — Other Ambulatory Visit: Payer: Self-pay | Admitting: *Deleted

## 2018-12-10 ENCOUNTER — Ambulatory Visit
Admission: RE | Admit: 2018-12-10 | Discharge: 2018-12-10 | Disposition: A | Discharge status: Home / Self Care | Payer: Medicare Other | Source: Ambulatory Visit | Attending: Family Medicine | Admitting: Family Medicine

## 2018-12-10 DIAGNOSIS — E049 Nontoxic goiter, unspecified: Secondary | ICD-10-CM

## 2018-12-10 DIAGNOSIS — E041 Nontoxic single thyroid nodule: Secondary | ICD-10-CM | POA: Diagnosis not present

## 2018-12-10 NOTE — Patient Outreach (Signed)
Red EMMI pneumonia received for 12/09/18- Received flu shot? No-  Outreach call to pt to address, pt reports he did not get the flu vaccine and does not intend to citing a bad experience in the past and he is not changing his mind.  Jacqlyn Larsen [facility], [place] Coordinator [phone]

## 2018-12-11 ENCOUNTER — Encounter: Payer: Self-pay | Admitting: Internal Medicine

## 2018-12-11 ENCOUNTER — Other Ambulatory Visit: Payer: Self-pay | Admitting: Family Medicine

## 2018-12-11 ENCOUNTER — Ambulatory Visit (INDEPENDENT_AMBULATORY_CARE_PROVIDER_SITE_OTHER): Payer: Medicare Other | Admitting: Internal Medicine

## 2018-12-11 ENCOUNTER — Other Ambulatory Visit: Payer: Self-pay

## 2018-12-11 VITALS — BP 114/58 | HR 96 | Temp 97.0°F | Ht 73.0 in | Wt 203.2 lb

## 2018-12-11 DIAGNOSIS — R918 Other nonspecific abnormal finding of lung field: Secondary | ICD-10-CM | POA: Diagnosis not present

## 2018-12-11 DIAGNOSIS — J432 Centrilobular emphysema: Secondary | ICD-10-CM

## 2018-12-11 DIAGNOSIS — F17200 Nicotine dependence, unspecified, uncomplicated: Secondary | ICD-10-CM

## 2018-12-11 DIAGNOSIS — E042 Nontoxic multinodular goiter: Secondary | ICD-10-CM

## 2018-12-11 DIAGNOSIS — F17210 Nicotine dependence, cigarettes, uncomplicated: Secondary | ICD-10-CM | POA: Diagnosis not present

## 2018-12-11 NOTE — Progress Notes (Signed)
David Hendricks    [id number]    Nov 17, 1950  Primary Care Physician:Matthews, Einar Pheasant, DO  Referring Physician: [name], [address] [place],  [place] [address] Reason for Consultation: "pulmonary nodules" Date of Consultation: 12/11/2018  Chief complaint:   Chief Complaint  Patient presents with  . Pulmonary Consult    pneumonia     HPI: David Hendricks is a 68 year old gentleman with a history of coronary artery disease status post PCI, 90-pack-year smoking history, and recent hospital stay for acute cholecystitis who presents for new patient evaluation.  Recent complicated hospital stay - acute cholecystitis s/p percutaneous drain placement, and treatment for HCAP.  At some point during his hospital stay he also left AMA.  He presented to the hospital, and was eventually discharged on room air. Had a CTPE study during this admission which shows pulmonary nodules.  He presents today for evaluation of his nodules.  Today he is visibly upset during our meeting as he was just informed that he has thyroid nodules that could be cancer.  He is pacing and anxious.  He has dyspnea, and feels that he is not doing well, and is reticent to talk.  Social History   Occupational History  . Occupation: retired Mudlogger  Tobacco Use  . Smoking status: Current Every Day Smoker    Packs/day: 2.00    Years: 45.00    Pack years: 90.00    Types: Cigarettes    Start date: 01/09/1961  . Smokeless tobacco: Never Used  . Tobacco comment: tobacco info given  Substance and Sexual Activity  . Alcohol use: No  . Drug use: No  . Sexual activity: Not on file    Relevant family history:  Family History  Problem Relation Age of Onset  . Cancer Father        called [facility] per pt    Past Medical History:  Diagnosis Date  . Anxiety   . Arthritis   . Coronary artery disease    10/18 PCI/DES to pLAD, mild nonobstructive disease in the Lcx/RCA. Normal EF.   Marland Kitchen Dysrhythmia  2016   irregular heartbeat  . Gallstones   . Myocardial infarction ([place])   . Pneumonia   . Seasonal allergies     Past Surgical History:  Procedure Laterality Date  . CORONARY ANGIOPLASTY  10/09/2016  . CORONARY STENT INTERVENTION N/A 10/09/2016   Procedure: CORONARY STENT INTERVENTION;  Surgeon: Nelva Bush, MD;  Location: [place] [facility];  Service: Cardiovascular;  Laterality: N/A;  . INTRAVASCULAR ULTRASOUND/IVUS N/A 10/09/2016   Procedure: Intravascular Ultrasound/IVUS;  Surgeon: Nelva Bush, MD;  Location: [place] [facility];  Service: Cardiovascular;  Laterality: N/A;  . IR CHOLANGIOGRAM EXISTING TUBE  11/18/2018  . IR PERC CHOLECYSTOSTOMY  11/16/2018  . LEFT HEART CATH AND CORONARY ANGIOGRAPHY N/A 10/09/2016   Procedure: LEFT HEART CATH AND CORONARY ANGIOGRAPHY;  Surgeon: Nelva Bush, MD;  Location: [place] [facility];  Service: Cardiovascular;  Laterality: N/A;    Review of systems: Review of Systems  Constitutional: Negative for chills, fever and weight loss.  HENT: Positive for congestion. Negative for sinus pain and sore throat.   Eyes: Negative for discharge and redness.  Respiratory: Positive for shortness of breath. Negative for cough, hemoptysis, sputum production and wheezing.   Cardiovascular: Negative for chest pain, palpitations and leg swelling.  Gastrointestinal: Positive for abdominal pain. Negative for heartburn, nausea and vomiting.  Musculoskeletal: Positive for joint pain. Negative  for myalgias.  Skin: Negative for rash.  Neurological: Negative for dizziness, tremors, focal weakness and headaches.  Endo/Heme/Allergies: Negative for environmental allergies.  Psychiatric/Behavioral: Negative for depression. The patient is not nervous/anxious.   All other systems reviewed and are negative.   Physical Exam: Blood pressure (!) 114/58, pulse 96, temperature (!) 97 F (36.1 C), temperature source Oral, height 6\' 1"  (1.854 m), weight 203 lb 3.2 oz  (92.2 kg), SpO2 96 %. Gen:      No acute distress Eyes: EOMI, sclera anicteric ENT:  no nasal polyps, mucus membranes moist Neck:     Thyromegaly is present Lungs:    No increased respiratory effort, symmetric chest wall excursion, diminished breath sounds bilaterally CV:         Tachycardic rate and regular rhythm; no murmurs, rubs, or gallops.  No pedal edema Abd:      + bowel sounds; soft, non-tender; no distension MSK: no acute synovitis of DIP or PIP joints, no mechanics hands.  Skin:      Warm and dry; no rashes Neuro: normal speech, no focal facial asymmetry Psych: alert and oriented x3, anxious, pacing  Data Reviewed: Imaging: I have personally reviewed the CT PE study from 11/06.  He has multiple pulmonary nodules the largest of which is 7 mm in diameter.  There is moderate to severe centrilobular emphysema which is upper lobe predominant.  There is no pulmonary embolism.  PFTs: No PFTs on file  Labs: Lab Results  Component Value Date   WBC 8.6 11/27/2018   HGB 16.2 11/27/2018   HCT 48.8 11/27/2018   MCV 85.1 11/27/2018   PLT 260.0 11/27/2018    Immunization status: Immunization History  Administered Date(s) Administered  . Pneumococcal Conjugate-13 05/10/2017   Assessment:  Multiple Pulmonary Nodules -high risk patient Centrilobular Emphysema Multinodular Goiter Tobacco Use Disorder -ongoing, 90-pack-year smoking history.   Plan/Recommendations: Based on Fleischner's criteria, I would recommend a follow-up noncontrast CT chest in 6 months time, which would put him at May 2020.  Unfortunately the patient left before I could convey these recommendations.  He cited being very upset about the news from his thyroid ultrasound, he did not feel that he could continue our encounter.  I will have my nurse follow-up with him tomorrow regarding the recommendations for CT scan.  Should his dyspnea worsen between now and 6 months from now I would recommend he call and make a  sooner appointment.  He probably needs some pulmonary function testing, and may benefit from inhaler therapy if he is open.  I personally spent 4 minutes counseling the patient regarding tobacco use disorder.  Patient is symptomatic from tobacco use disorder due to the following condition emphysema and pulmonary nodules.  The patient's response was pre-contemplative.  We discussed nicotine replacement therapy, Wellbutrin, Chantix.  We identified to gather patient specific barriers to change.  The patient is open to future discussions about tobacco cessation.   Return to Care: Return in about 6 months (around 06/11/2019).  Lenice Llamas, MD Pulmonary and [place]  CC: [name], DO   Fleischner Society Guidelines [address], [place], [place], et al. Guidelines for management of incidental pulmonary nodules detected on CT Images: From the Fleischner Society. Radiology 2017; [id number]. Copyright  2017 Radiological Society of [place].  Evaluation of the incidental solid pulmonary nodule in adults Nodule size (mm) Low (<5%) cancer risk High (>65%) or moderate (5 to 65%) cancer risk  Solitary  <  6 No routine follow-up Optional CT at 12 months  6 to 8 CT at 6 to 12 months, then consider CT at 18 to 24 months CT at 6 to 12 months, then CT at 18 to 24 months  >8 CT at 3 months, then at 9 and 24 months FDG PET/CT, biopsy or resection  Multiple (evaluation based on largest nodule)  <6 No routine follow-up Optional CT at 12 months  ?6 CT at 3 to 6 months, then consider CT at 18 to 24 months CT at 3 to 6 months, then CT at 18 to 24 months    Not applicable to patients age <35 years, in lung cancer screening, with immunosuppression, known pulmonary disease or symptoms or active primary cancer.  Chest CT performed without contrast as contiguous 1 mm sections using low dose technique.  Growing or FDG-avid nodules should undergo  biopsy or resection. Growth is defined as >1.5 mm increase.  Nodules unchanged for >2 years are benign.   CT: computed tomography; FDG: 18-fluorodeoxyglucose; PET: positron emission tomography.

## 2018-12-16 DIAGNOSIS — Z8719 Personal history of other diseases of the digestive system: Secondary | ICD-10-CM | POA: Diagnosis not present

## 2018-12-18 ENCOUNTER — Ambulatory Visit (INDEPENDENT_AMBULATORY_CARE_PROVIDER_SITE_OTHER): Payer: Medicare Other | Admitting: Psychology

## 2018-12-18 DIAGNOSIS — F411 Generalized anxiety disorder: Secondary | ICD-10-CM

## 2018-12-19 ENCOUNTER — Other Ambulatory Visit: Payer: Self-pay | Admitting: *Deleted

## 2018-12-19 NOTE — Patient Outreach (Signed)
Red EMMI pneumonia received for 12/18/18- Smoked or been around smoke- yes.  This question has been previously addressed with pt who admits he smokes 2 packs per day and has no intentions of quitting and declined smoking cessation.  Jacqlyn Larsen [facility], [place] Coordinator [phone]

## 2018-12-20 ENCOUNTER — Other Ambulatory Visit: Payer: Self-pay

## 2018-12-21 ENCOUNTER — Encounter: Payer: Self-pay | Admitting: Gastroenterology

## 2018-12-24 ENCOUNTER — Encounter: Payer: Self-pay | Admitting: Internal Medicine

## 2018-12-24 ENCOUNTER — Other Ambulatory Visit: Payer: Self-pay

## 2018-12-24 ENCOUNTER — Ambulatory Visit (INDEPENDENT_AMBULATORY_CARE_PROVIDER_SITE_OTHER): Payer: Medicare Other | Admitting: Internal Medicine

## 2018-12-24 VITALS — BP 116/78 | HR 85 | Temp 97.8°F | Ht 73.0 in | Wt 205.0 lb

## 2018-12-24 DIAGNOSIS — E0590 Thyrotoxicosis, unspecified without thyrotoxic crisis or storm: Secondary | ICD-10-CM

## 2018-12-24 DIAGNOSIS — E042 Nontoxic multinodular goiter: Secondary | ICD-10-CM | POA: Diagnosis not present

## 2018-12-24 NOTE — Progress Notes (Signed)
Name: David Hendricks  MRN/ DOB: [id number], [birth year]    Age/ Sex: 68 y.o., male    PCP: [name], DO   Reason for Endocrinology Evaluation: MNG     Date of Initial Endocrinology Evaluation: 12/24/2018     HPI: [name] is a 68 y.o. male with a past medical history of GERD and BPH. The patient presented for initial endocrinology clinic visit on 12/24/2018 for consultative assistance with his MNG.   Pt was noted to have a low TSH during routine workup in 11/2018 at 0.13 uIU/ml which prompted a thyroid ultrasound, the ultrasound revealed multiple thyroid nodules some of which meeting criteria for FNA.   He denies any weight loss, palpitations or diarrhea.  Denies anxiety, jittery sensation or tremors.    Denies local neck symptoms   No exposure to radiation   No FH of thyroid disease      HISTORY:  Past Medical History:  Past Medical History:  Diagnosis Date  . Anxiety   . Arthritis   . Coronary artery disease    10/18 PCI/DES to pLAD, mild nonobstructive disease in the Lcx/RCA. Normal EF.   Marland Kitchen Dysrhythmia 2016   irregular heartbeat  . Gallstones   . Myocardial infarction ([place])   . Pneumonia   . Seasonal allergies    Past Surgical History:  Past Surgical History:  Procedure Laterality Date  . CORONARY ANGIOPLASTY  10/09/2016  . CORONARY STENT INTERVENTION N/A 10/09/2016   Procedure: CORONARY STENT INTERVENTION;  Surgeon: Nelva Bush, MD;  Location: [place] [facility];  Service: Cardiovascular;  Laterality: N/A;  . INTRAVASCULAR ULTRASOUND/IVUS N/A 10/09/2016   Procedure: Intravascular Ultrasound/IVUS;  Surgeon: Nelva Bush, MD;  Location: [place] [facility];  Service: Cardiovascular;  Laterality: N/A;  . IR CHOLANGIOGRAM EXISTING TUBE  11/18/2018  . IR PERC CHOLECYSTOSTOMY  11/16/2018  . LEFT HEART CATH AND CORONARY ANGIOGRAPHY N/A 10/09/2016   Procedure: LEFT HEART CATH AND CORONARY ANGIOGRAPHY;  Surgeon: Nelva Bush, MD;  Location: [place] [facility];  Service: Cardiovascular;  Laterality: N/A;      Social History:  reports that he has been smoking cigarettes. He started smoking about 57 years ago. He has a 90.00 pack-year smoking history. He has never used smokeless tobacco. He reports that he does not drink alcohol or use drugs.  Family History: family history includes Alcohol abuse in his father.   HOME MEDICATIONS: Allergies as of 12/24/2018   No Known Allergies     Medication List       Accurate as of December 24, 2018  3:21 PM. If you have any questions, ask your nurse or doctor.        pantoprazole 40 MG tablet Commonly known as: PROTONIX Take 1 tablet (40 mg total) by mouth daily.   tamsulosin 0.4 MG Caps capsule Commonly known as: FLOMAX Take 0.4 mg by mouth.   zolpidem 5 MG tablet Commonly known as: AMBIEN Take 1 tablet (5 mg total) by mouth at bedtime as needed for up to 14 days for sleep.         REVIEW OF SYSTEMS: A comprehensive ROS was conducted with the patient and is negative except as per HPI and below:  Review of Systems  Constitutional: Negative for fever and weight loss.  HENT: Negative for congestion and sore throat.   Respiratory: Negative for cough and shortness of breath.   Cardiovascular: Negative for chest pain and palpitations.  Gastrointestinal: Negative for diarrhea and nausea.  Genitourinary: Negative for frequency.  Neurological: Negative for tingling and tremors.  Endo/Heme/Allergies: Negative for polydipsia.  Psychiatric/Behavioral: The patient is not nervous/anxious.        OBJECTIVE:  VS: BP 116/78 (BP Location: Left Arm, Patient Position: Sitting, Cuff Size: Normal)   Pulse 85   Temp 97.8 F (36.6 C)   Ht 6\' 1"  (1.854 m)   Wt 205 lb (93 kg)   SpO2 99%   BMI 27.05 kg/m    Wt Readings from Last 3 Encounters:  12/24/18 205 lb (93 kg)  12/11/18 203 lb 3.2 oz (92.2 kg)  12/02/18 200 lb (90.7 kg)     EXAM: General: Pt appears well and is in NAD    Hydration: Well-hydrated with moist mucous membranes and good skin turgor  Eyes: External eye exam normal without stare, lid lag or exophthalmos.  EOM intact.    Neck: General: Supple without adenopathy. Thyroid: Thyroid size normal. Left nodules appreciated.  Lungs: Clear with good BS bilat with no rales, rhonchi, or wheezes  Heart: Auscultation: RRR.  Abdomen: Normoactive bowel sounds, soft, nontender, without masses or organomegaly palpable  Extremities:  BL LE: No pretibial edema normal ROM and strength.  Skin: Hair: Texture and amount normal with gender appropriate distribution Skin Inspection: No rashes Skin Palpation: Skin temperature, texture, and thickness normal to palpation  Neuro: Cranial nerves: II - XII grossly intact  Motor: Normal strength throughout DTRs: 2+ and symmetric in UE without delay in relaxation phase  Mental Status: Judgment, insight: Intact Orientation: Oriented to time, place, and person Mood and affect: No depression, anxiety, or agitation     DATA REVIEWED: Results for JALMER, TOOKER (MRN [id number]) as of 12/25/2018 13:49  Ref. Range 11/27/2018 15:03 12/02/2018 09:07 12/24/2018 15:24  TSH Latest Ref Range: 0.35 - 4.50 uIU/mL 0.13 (L)  0.12 (L)  T4,Free(Direct) Latest Ref Range: 0.60 - 1.60 ng/dL  1.09 1.02      Thyroid ultrasound 12/10/2018 Estimated total number of nodules >/= 1 cm: 6-10  Number of spongiform nodules >/=  2 cm not described below (TR1): 0  Number of mixed cystic and solid nodules >/= 1.5 cm not described below ([place]): 0  _________________________________________________________  There is an approximately 0.9 cm isoechoic ill-defined nodule/pseudonodule within the superior pole the right lobe of the thyroid (labeled 1) which does not meet criteria to recommend percutaneous sampling or continued dedicated follow-up  _________________________________________________________  Nodule # 2:  Location: Right;  Superior  Maximum size: 2.5 cm; Other 2 dimensions: 1.5 x 1.4 cm  Composition: solid/almost completely solid (2)  Echogenicity: isoechoic (1)  Shape: not taller-than-wide (0)  Margins: ill-defined (0)  Echogenic foci: none (0)  ACR TI-RADS total points: 3.  ACR TI-RADS risk category: TR3 (3 points).  ACR TI-RADS recommendations:  **Given size (>/= 2.5 cm) and appearance, fine needle aspiration of this mildly suspicious nodule should be considered based on TI-RADS criteria.  _________________________________________________________  There is an approximately 1.2 x 1.1 x 1.0 cm hyperechoic nodule within the mid aspect the right lobe of the thyroid (labeled 3), which does not meet imaging criteria to recommend percutaneous sampling or continued dedicated follow-up.  _________________________________________________________  Nodule # 4:  Location: Right; Inferior  Maximum size: 1.1 cm; Other 2 dimensions: 1.0 x 0.9 cm  Composition: solid/almost completely solid (2)  Echogenicity: hypoechoic (2)  Shape: not taller-than-wide (0)  Margins: smooth (0)  Echogenic foci: none (0)  ACR TI-RADS total points: 4.  ACR TI-RADS risk category: TR4 (4-6 points).  ACR TI-RADS recommendations:  *Given size (>/= 1 - 1.4 cm) and appearance, a follow-up ultrasound in 1 year should be considered based on TI-RADS criteria.  _________________________________________________________  Nodule # 5:  Location: Left; Superior  Maximum size: 3.5 cm; Other 2 dimensions: 2.0 x 2.0 cm  Composition: solid/almost completely solid (2)  Echogenicity: isoechoic (1)  Shape: not taller-than-wide (0)  Margins: ill-defined (0)  Echogenic foci: none (0)  ACR TI-RADS total points: 3.  ACR TI-RADS risk category: TR3 (3 points).  ACR TI-RADS recommendations:  **Given size (>/= 2.5 cm) and appearance, fine needle aspiration of this mildly  suspicious nodule should be considered based on TI-RADS criteria.  _________________________________________________________  There are two adjacent ill-defined isoechoic nodules/pseudo nodules within the mid aspect of the left lobe of the thyroid (collectively labeled 6), neither of which meet imaging criteria to recommend percutaneous sampling or continued dedicated follow-up.  _________________________________________________________  Nodule # 7:  Location: Left; Inferior  Maximum size: 3.2 cm; Other 2 dimensions: 2.9 x 2.0 cm  Composition: solid/almost completely solid (2)  Echogenicity: isoechoic (1)  Shape: not taller-than-wide (0)  Margins: ill-defined (0)  Echogenic foci: none (0)  ACR TI-RADS total points: 3.  ACR TI-RADS risk category: TR3 (3 points).  ACR TI-RADS recommendations:  **Given size (>/= 2.5 cm) and appearance, fine needle aspiration of this mildly suspicious nodule should be considered based on TI-RADS criteria.  _________________________________________________________  Nodule # 8:  Location: Left; Inferior  Maximum size: 5.3 cm; Other 2 dimensions: 3.7 x 2.6 cm  Composition: solid/almost completely solid (2)  Echogenicity: isoechoic (1)  Shape: not taller-than-wide (0)  Margins: ill-defined (0)  Echogenic foci: none (0)  ACR TI-RADS total points: 3.  ACR TI-RADS risk category: TR3 (3 points).  ACR TI-RADS recommendations:  **Given size (>/= 2.5 cm) and appearance, fine needle aspiration of this mildly suspicious nodule should be considered based on TI-RADS criteria.  _________________________________________________________  IMPRESSION: 1. Thyromegaly with findings compatible with multinodular goiter. 2. Nodules labeled #2, #5, #7 and #8 all meet imaging criteria to recommend percutaneous sampling. Current imaging recommendations are to only biopsy the two most worrisome thyroid nodules at a  given time. As such, nodules #5 and #8 could both undergo ultrasound-guided fine-needle aspiration as clinically indicated. 3. Nodule #4 meets imaging criteria to recommend a 1 year follow-up.         ASSESSMENT/PLAN/RECOMMENDATIONS:   1. Subclinical Hyperthyroidism:   - The causes of subclinical hyperthyroidism are the same as the causes of overt hyperthyroidism, and like overt hyperthyroidism, subclinical hyperthyroidism can be persistent or transient. Common causes of subclinical hyperthyroidism include autonomously functioning thyroid adenomas and multinodular goiters , or Graves' disease .   Most patients with subclinical hyperthyroidism have no clinical manifestations of hyperthyroidism, and those symptoms that are present (eg, tachycardia, tremor, dyspnea on exertion, weight loss) are mild and nonspecific." However, subclinical hyperthyroidism is associated with an increased risk of atrial fibrillation and, primarily in postmenopausal women, a decrease in bone mineral density.  - Pt is clinically euthyroid at this time     2. MNG:   - No local neck symptoms  - Pt has multiple thyroid nodules and 4 of them meet criteria for FNA. Will proceed with uptake and scan first to determine if these nodules are "hot" vs warm or cold. And make a final determination about FNA then.  - Thyroid uptake and scan has been ordered    Pt expressed understanding of the above    F/U in  3 months    Signed electronically by: Mack Guise, MD  [facility] Endocrinology  [facility] [address] [place], [place] [address] Phone: [phone] FAX: [phone]   CC: [name], [place] [address] Phone: [phone] Fax: [phone]   Return to Endocrinology clinic as below: Future Appointments  Date Time Provider [place]  12/25/2018  5:00 PM Dolores Lory [facility] None  01/07/2019  2:40 PM [facility]  NURSE [facility] PEC  03/25/2019  3:20 PM Shammara Jarrett, Melanie Crazier, MD [facility] None

## 2018-12-24 NOTE — Patient Instructions (Signed)
-   Please stop by the lab today, will contact you with the results   -  Thyroid Uptake and Scan works like this: We would first check a thyroid "scan" (a special, but easy and painless type of thyroid x ray).  you go to the x-ray department of the hospital to swallow a pill, which contains a miniscule amount of radiation.  You will not notice any symptoms from this.  You will go back to the x-ray department the next day, to lie down in front of a camera.  The results of this will be sent to me.

## 2018-12-25 ENCOUNTER — Ambulatory Visit (INDEPENDENT_AMBULATORY_CARE_PROVIDER_SITE_OTHER): Payer: Medicare Other | Admitting: Psychology

## 2018-12-25 DIAGNOSIS — F411 Generalized anxiety disorder: Secondary | ICD-10-CM | POA: Diagnosis not present

## 2018-12-25 LAB — T4, FREE: Free T4: 1.02 ng/dL (ref 0.60–1.60)

## 2018-12-25 LAB — TSH: TSH: 0.12 u[IU]/mL — ABNORMAL LOW (ref 0.35–4.50)

## 2018-12-28 LAB — TRAB (TSH RECEPTOR BINDING ANTIBODY): TRAB: <1 IU/L (ref <=2.00)

## 2018-12-30 ENCOUNTER — Other Ambulatory Visit: Payer: Self-pay | Admitting: Family Medicine

## 2019-01-06 ENCOUNTER — Other Ambulatory Visit: Payer: Self-pay

## 2019-01-07 ENCOUNTER — Ambulatory Visit: Payer: Medicare Other

## 2019-01-09 ENCOUNTER — Ambulatory Visit: Payer: Medicare Other | Admitting: Psychology

## 2019-01-13 ENCOUNTER — Encounter (HOSPITAL_COMMUNITY)
Admission: RE | Admit: 2019-01-13 | Discharge: 2019-01-13 | Disposition: A | Discharge status: Home / Self Care | Payer: Medicare Other | Source: Ambulatory Visit | Attending: Internal Medicine | Admitting: Internal Medicine

## 2019-01-13 ENCOUNTER — Other Ambulatory Visit: Payer: Self-pay

## 2019-01-13 DIAGNOSIS — E042 Nontoxic multinodular goiter: Secondary | ICD-10-CM | POA: Diagnosis not present

## 2019-01-13 DIAGNOSIS — E0590 Thyrotoxicosis, unspecified without thyrotoxic crisis or storm: Secondary | ICD-10-CM

## 2019-01-13 MED ORDER — SODIUM IODIDE I-123 7.4 MBQ CAPS
462.8000 | ORAL_CAPSULE | Freq: Once | ORAL | Status: AC
  Administered 2019-01-13: 462.8 via ORAL

## 2019-01-14 ENCOUNTER — Encounter (HOSPITAL_COMMUNITY)
Admission: RE | Admit: 2019-01-14 | Discharge: 2019-01-14 | Disposition: A | Discharge status: Home / Self Care | Payer: Medicare Other | Source: Ambulatory Visit | Attending: Internal Medicine | Admitting: Internal Medicine

## 2019-01-14 DIAGNOSIS — E042 Nontoxic multinodular goiter: Secondary | ICD-10-CM | POA: Diagnosis not present

## 2019-01-16 ENCOUNTER — Telehealth: Payer: Self-pay | Admitting: Internal Medicine

## 2019-01-16 ENCOUNTER — Ambulatory Visit (INDEPENDENT_AMBULATORY_CARE_PROVIDER_SITE_OTHER): Payer: Medicare Other

## 2019-01-16 ENCOUNTER — Other Ambulatory Visit: Payer: Self-pay

## 2019-01-16 DIAGNOSIS — E538 Deficiency of other specified B group vitamins: Secondary | ICD-10-CM | POA: Diagnosis not present

## 2019-01-16 MED ORDER — CYANOCOBALAMIN 1000 MCG/ML IJ SOLN
1000.0000 ug | Freq: Once | INTRAMUSCULAR | Status: AC
  Administered 2019-01-16: 1000 ug via INTRAMUSCULAR

## 2019-01-16 NOTE — Progress Notes (Signed)
Pt came into the office today to get his monthly B12 injection. Gave injection into the right ventrogluteal, pt tolerated injection well.

## 2019-01-16 NOTE — Telephone Encounter (Signed)
Attempted to contact David Hendricks to discuss his thyroid uptake and scan but he was at a doctor's appointment.       Will call later   Abby Nena Jordan, MD  [facility] Endocrinology  [facility] [address] [place], [place] [address] Phone: [phone] FAX: [phone]

## 2019-01-16 NOTE — Progress Notes (Signed)
Agree with treatment as documented below

## 2019-01-17 ENCOUNTER — Telehealth: Payer: Self-pay | Admitting: Internal Medicine

## 2019-01-17 DIAGNOSIS — E0520 Thyrotoxicosis with toxic multinodular goiter without thyrotoxic crisis or storm: Secondary | ICD-10-CM | POA: Insufficient documentation

## 2019-01-17 NOTE — Telephone Encounter (Signed)
Discussed thyroid uptake and scan on 01/17/2019 at 11;30 Am     Pt with autonomous multinodular goiter.   1. He has a right superior thyroid nodule that is cold and meets criteria for FNA 2. He has a left inferior warm area, with central hot area, but since he has 2 adjacent nodules , its hard to figure out which one is the hot and which one is the warm, so will proceed with FNA of both.   3. The left superior nodule does NOT need an FNA because its a hot nodule , so are the rest on the right.    These results were confirmed with Dr. Thornton Papas. (radiology)   Pt notified that [facility] can biopsy 2 at a time, so will proceed with the 2 on the left inferior , once these results are back, will schedule him for the right superior nodule.       Abby Nena Jordan, MD  [facility] Endocrinology  [facility] [address] [place], [place] [address] Phone: [phone] FAX: [phone]

## 2019-01-20 ENCOUNTER — Telehealth: Payer: Self-pay | Admitting: Internal Medicine

## 2019-01-20 ENCOUNTER — Other Ambulatory Visit: Payer: Self-pay | Admitting: *Deleted

## 2019-01-20 DIAGNOSIS — M19011 Primary osteoarthritis, right shoulder: Secondary | ICD-10-CM | POA: Diagnosis not present

## 2019-01-20 DIAGNOSIS — M25511 Pain in right shoulder: Secondary | ICD-10-CM | POA: Diagnosis not present

## 2019-01-20 DIAGNOSIS — J441 Chronic obstructive pulmonary disease with (acute) exacerbation: Secondary | ICD-10-CM

## 2019-01-20 DIAGNOSIS — G8929 Other chronic pain: Secondary | ICD-10-CM | POA: Diagnosis not present

## 2019-01-20 NOTE — Telephone Encounter (Signed)
Pt informed pt also stated that he is scheduled for 01/23/2019 for FNA

## 2019-01-20 NOTE — Telephone Encounter (Signed)
Patient requests to be called at ph# [phone] re: patient has swelling from left side ear that goes all the way down his neck (causes pain from throat to ear). (Patient was offered to be scheduled for appointment but preferred a note be sent to Dr. Kelton Pillar and to be called)

## 2019-01-20 NOTE — Patient Outreach (Signed)
Per Patient David Hendricks member has had multiple ED visits/hospitalizations. Per Patient David Hendricks member has had a 7 ED/hospitalizations in the past 6 months.  Member screened for potential [facility] Care Management needs as a benefit of [place] [employer].   Per chart records, David Hendricks has called MD office today. Telephone call made to David Hendricks at [phone]. No answer. Left HIPAA compliant voicemail message requesting return call.  Will make referral to [place] due to multiple ED visits/hospitalizations and increased risk for readmission.   David Rolling, MSN-Ed, RN,BSN [place] Acute Care Coordinator [phone] [facility]) [phone]  (Toll free office)

## 2019-01-20 NOTE — Telephone Encounter (Signed)
Please advise 

## 2019-01-23 ENCOUNTER — Other Ambulatory Visit: Payer: Self-pay | Admitting: *Deleted

## 2019-01-23 ENCOUNTER — Ambulatory Visit
Admission: RE | Admit: 2019-01-23 | Discharge: 2019-01-23 | Disposition: A | Discharge status: Home / Self Care | Payer: Medicare Other | Source: Ambulatory Visit | Attending: Internal Medicine | Admitting: Internal Medicine

## 2019-01-23 ENCOUNTER — Other Ambulatory Visit (HOSPITAL_COMMUNITY)
Admission: RE | Admit: 2019-01-23 | Discharge: 2019-01-23 | Disposition: A | Discharge status: Home / Self Care | Payer: Medicare Other | Source: Ambulatory Visit | Attending: Internal Medicine | Admitting: Internal Medicine

## 2019-01-23 DIAGNOSIS — E0520 Thyrotoxicosis with toxic multinodular goiter without thyrotoxic crisis or storm: Secondary | ICD-10-CM

## 2019-01-23 DIAGNOSIS — E042 Nontoxic multinodular goiter: Secondary | ICD-10-CM | POA: Diagnosis not present

## 2019-01-23 DIAGNOSIS — E041 Nontoxic single thyroid nodule: Secondary | ICD-10-CM | POA: Diagnosis not present

## 2019-01-23 NOTE — Patient Outreach (Addendum)
[facility]) Care Management  01/23/2019  David Hendricks 1950/09/25 [id number]    Telephone Assessment-Enrolled-Knowledge Deficient  RN spoke with pt today and introduced the purpose for today's call and inquired further on pt's possible needs. Pt states he is pending a biopsy today. Reports no issues with no SOB however ongoing swelling to his neck and left ear area that he is pending further intervention. Other issues related to "stomach pain". Pt reports he is also pending an appointment in April to address this issue.  RN discussed [facility] services and other disciplines available to assist with community resources and counseling. In addition to pharmacy needs. RN offered to enroll pt into a program to assist with educating him on how to avoid risk for readmission with preventable measures and reaching out to his providers with any medical issues before visiting the ED/hospital. Discussed this plan of care with ongoing communication to assist pt on how to improve his management of care and lower his risk from visiting the ED. Pt very appreciative and agreed to enrollment. Pt is aware [facility] would communicate his disposition with his provider.  Other topics relates to program to assist pt with smoking cessation at the time he is ready to quit. Pt did not wish to visit this information today. Pt also interested in receiving the Co-vid vaccine and requested RN to notify his provider with this request. RN send message to [name], DO concerning pt's request.  Plan: Will send [facility] packet with calendar and scheduled another follow up call over the next few weeks to completed the initial assessment (pt receptive due to unable to complete at this time pending a medical appointment). Care plan generated and discussed with goals and interventions.  [facility] CM Care Plan Problem One     Most Recent Value  Care Plan Problem One  Deficient Knowledge related to multiple admission/ED visits  Role  Documenting the Problem One  Care Management Telephonic [place] for Problem One  Active  [facility] Long Term Goal   Pt will not have an hospitalization or ED visit within the next 60 days.  [facility] Long Term Goal Start Date  01/23/19  Interventions for Problem One Long Term Goal  Will strongly encouraged pt to contact his provider with any related symptoms or issues that are not acute to avoid readmission or increase risk of ED visit.  [facility] CM Short Term Goal #1   Pt will develop preventable measures to avoid readmission or ED visit within the next 30 days.  [facility] CM Short Term Goal #1 Start Date  01/23/19  Interventions for Short Term Goal #1  Will discuss pt reaching out to his providers with questions or concerns concerning on any problems or issues if not acute prior to visiting the ED/hospital  [facility] CM Short Term Goal #2   Adherence to all scheduled medial appointments within the next 30 days.  [facility] CM Short Term Goal #2 Start Date  01/23/19  Interventions for Short Term Goal #2  Will discuss the importance of attending all scheduled medical appointments to avoid acute issues from occurring. Will verify all upcoming appointments and sufficient transportation.  Note pt pending biopsy in April.      Raina Mina, RN Care Management Coordinator [place] Office [phone]

## 2019-01-24 LAB — CYTOLOGY - NON PAP

## 2019-02-06 ENCOUNTER — Ambulatory Visit: Payer: Self-pay | Admitting: *Deleted

## 2019-02-06 ENCOUNTER — Encounter: Payer: Self-pay | Admitting: *Deleted

## 2019-02-06 ENCOUNTER — Other Ambulatory Visit: Payer: Self-pay | Admitting: *Deleted

## 2019-02-06 NOTE — Patient Outreach (Signed)
David Hendricks [facility]) Care Management  02/06/2019  David Hendricks 01-Oct-1950 [id number]  Telephone Assessment (Completion of the initial assessment)  RN spoke with pt today as scheduled to completed the initial assessment. Pt indicated he received the packet of information however has not reviewed. Pt aware of the ongoing plan of care generated 2 weeks ago and will continue to adherence to the dicussed plan. Will update on the next outreach call to allow pt adherence with managing his care. No other inquires or request at this time as pt aware of what to do if acute symptoms should occur. Note provider has been updated on pt's disposition with [facility] services. Pt prefer quarterly follow up call. Will follow up in a few months with this ongoing plan of care. Pt aware RN remains available for any needed resources.  Raina Mina, RN Care Management Coordinator [place] Office [phone]

## 2019-02-07 ENCOUNTER — Telehealth: Payer: Self-pay | Admitting: Internal Medicine

## 2019-02-07 DIAGNOSIS — E0520 Thyrotoxicosis with toxic multinodular goiter without thyrotoxic crisis or storm: Secondary | ICD-10-CM

## 2019-02-11 NOTE — Telephone Encounter (Signed)
Left a message for a call back on 02/11/2019 at 9:51 AM     [place], MD  [facility] Endocrinology  [facility] [address] [place], [place] [address] Phone: [phone] FAX: [phone]

## 2019-02-11 NOTE — Telephone Encounter (Signed)
Pt returned the call back on 02/11/2019 at 11 Am     Clinical History: Left superior 3.6cm; Other 2 dimensions: 2.0 x 2.0cm,  Solid / almost completely solid, Isoechoic, TI-RADS total points 3  Specimen Submitted: A. THYROID, LUP, FINE NEEDLE ASPIRATION:    FINAL MICROSCOPIC DIAGNOSIS:  - Follicular lesion of undetermined significance (Bethesda category III)    Afirma came back benign        Will proceed with FNA of the right superior nodule 2.5 cm and left inferior nodule 3.2 cm    Pt in agreement    [place], MD  [facility] Endocrinology  [facility] [address] [place], [place] [address] Phone: [place]: [phone]

## 2019-02-12 ENCOUNTER — Ambulatory Visit
Admission: RE | Admit: 2019-02-12 | Discharge: 2019-02-12 | Disposition: A | Discharge status: Home / Self Care | Payer: Medicare Other | Source: Ambulatory Visit | Attending: Internal Medicine | Admitting: Internal Medicine

## 2019-02-12 ENCOUNTER — Other Ambulatory Visit (HOSPITAL_COMMUNITY)
Admission: RE | Admit: 2019-02-12 | Discharge: 2019-02-12 | Disposition: A | Discharge status: Home / Self Care | Payer: Medicare Other | Source: Ambulatory Visit | Attending: Physician Assistant | Admitting: Physician Assistant

## 2019-02-12 DIAGNOSIS — E0520 Thyrotoxicosis with toxic multinodular goiter without thyrotoxic crisis or storm: Secondary | ICD-10-CM

## 2019-02-12 DIAGNOSIS — E042 Nontoxic multinodular goiter: Secondary | ICD-10-CM | POA: Diagnosis not present

## 2019-02-13 LAB — CYTOLOGY - NON PAP

## 2019-02-17 ENCOUNTER — Telehealth: Payer: Self-pay | Admitting: Internal Medicine

## 2019-02-17 NOTE — Telephone Encounter (Signed)
    Discussed FNA results as below    Clinical History: Left; Inferior 3.2cm; Other dimensions: 2.9 x 2.0 cm  solid/ almost completely solid isoechoic, TI-RADS  Specimen Submitted: A. THYROID,LEFT, FINE NEEDLE ASPIRATION:    FINAL MICROSCOPIC DIAGNOSIS:  - Consistent with benign follicular nodule (Bethesda category II)    Clinical History: Right; Superior 2.5 cm; Other 2 dimensions: 1.5 x 1.4  cm solid/almost completely solid isoechoic, TI-RADS 3  Specimen Submitted: A. THYROID, RIGHT, FINE NEEDLE ASPIRATION:    FINAL MICROSCOPIC DIAGNOSIS:  - Scant follicular epithelium present (Bethesda category I)    I have the pt the option of proceeding with another FNA in 3 month on the right superior nodule, we also discussed thyroidectomy vs serial monitoring.    Pt in agreement of repeat FNA in ~ 3 months   Will discuss further on next visit    [place], MD  [facility] Endocrinology  [facility] [address] [place], [place] [address] Phone: [phone] FAX: [phone]

## 2019-02-18 ENCOUNTER — Encounter (HOSPITAL_COMMUNITY): Payer: Self-pay

## 2019-03-21 ENCOUNTER — Other Ambulatory Visit: Payer: Self-pay

## 2019-03-22 ENCOUNTER — Other Ambulatory Visit: Payer: Self-pay | Admitting: Family Medicine

## 2019-03-24 ENCOUNTER — Other Ambulatory Visit: Payer: Self-pay | Admitting: Family Medicine

## 2019-03-24 NOTE — Telephone Encounter (Signed)
Must schedule with a new provider for future fills 30d only/thx dmf

## 2019-03-25 ENCOUNTER — Ambulatory Visit (INDEPENDENT_AMBULATORY_CARE_PROVIDER_SITE_OTHER): Payer: Medicare Other | Admitting: Internal Medicine

## 2019-03-25 ENCOUNTER — Other Ambulatory Visit: Payer: Self-pay

## 2019-03-25 ENCOUNTER — Encounter: Payer: Self-pay | Admitting: Internal Medicine

## 2019-03-25 VITALS — BP 120/80 | HR 87 | Ht 73.0 in | Wt 215.8 lb

## 2019-03-25 DIAGNOSIS — E0520 Thyrotoxicosis with toxic multinodular goiter without thyrotoxic crisis or storm: Secondary | ICD-10-CM | POA: Diagnosis not present

## 2019-03-25 DIAGNOSIS — E042 Nontoxic multinodular goiter: Secondary | ICD-10-CM | POA: Diagnosis not present

## 2019-03-25 DIAGNOSIS — E0590 Thyrotoxicosis, unspecified without thyrotoxic crisis or storm: Secondary | ICD-10-CM

## 2019-03-25 NOTE — Progress Notes (Signed)
Name: David Hendricks  MRN/ DOB: [id number], Feb 26, 1950    Age/ Sex: 69 y.o., male     PCP: [name], DO   Reason for Endocrinology Evaluation: MNG     Initial Endocrinology Clinic Visit: 12/24/2018    PATIENT IDENTIFIER: [name] is a 69 y.o., male with a past medical history of GERD and BPH. He has followed with [facility] since 12/24/2018 for consultative assistance with management of his MNG   HISTORICAL SUMMARY:  Pt was noted to have a low TSH during routine workup in 11/2018 at 0.13 uIU/ml which prompted a thyroid ultrasound, the ultrasound revealed multiple thyroid nodules some of which meeting criteria for FNA ( Right superior 2.5 cm, left superior 3.5 cm, left inferior 3.2cm and another left inferior 5.3 cm)   Thyroid uptake and scan performed 01/14/2019 with a normal 24 hour I-123 uptake = 11% (normal 10-30%) but the left superior nodule was "hot " , rest were cold/ warm nodules.      Patient had benign FNA of the left inferior 3.2 cm nodule, benign FNA of the left inferior nodule 5.3 cm, he initially had a cytology report of FLUS (Bethesda category III) with benign results on Afirma.  Of note the left superior hot nodule was biopsied based on the radiologist discretion. An attempt to biopsy the right superior nodule 2.5 cm revealed insufficient cellularity in 02/2019   SUBJECTIVE:   Today (03/26/2019):  [name] is here for a follow up on autonomous MNG.    He denies palpitations  Denies weight loss Denies diarrhea Denies tremors or anxiety   Numbness in the hands   Denies any local neck swelling  No biotin       ROS:  As per HPI.   HISTORY:  Past Medical History:  Past Medical History:  Diagnosis Date  . Anxiety   . Arthritis   . Coronary artery disease    10/18 PCI/DES to pLAD, mild nonobstructive disease in the Lcx/RCA. Normal EF.   Marland Kitchen Dysrhythmia 2016   irregular heartbeat  . Gallstones   . Myocardial infarction ([place])     . Pneumonia   . Seasonal allergies    Past Surgical History:  Past Surgical History:  Procedure Laterality Date  . CORONARY ANGIOPLASTY  10/09/2016  . CORONARY STENT INTERVENTION N/A 10/09/2016   Procedure: CORONARY STENT INTERVENTION;  Surgeon: Nelva Bush, MD;  Location: [place] CV LAB;  Service: Cardiovascular;  Laterality: N/A;  . INTRAVASCULAR ULTRASOUND/IVUS N/A 10/09/2016   Procedure: Intravascular Ultrasound/IVUS;  Surgeon: Nelva Bush, MD;  Location: [place] CV [facility];  Service: Cardiovascular;  Laterality: N/A;  . IR CHOLANGIOGRAM EXISTING TUBE  11/18/2018  . IR PERC CHOLECYSTOSTOMY  11/16/2018  . LEFT HEART CATH AND CORONARY ANGIOGRAPHY N/A 10/09/2016   Procedure: LEFT HEART CATH AND CORONARY ANGIOGRAPHY;  Surgeon: Nelva Bush, MD;  Location: [place] CV LAB;  Service: Cardiovascular;  Laterality: N/A;    Social History:  reports that he has been smoking cigarettes. He started smoking about 58 years ago. He has a 90.00 pack-year smoking history. He has never used smokeless tobacco. He reports that he does not drink alcohol or use drugs. Family History:  Family History  Problem Relation Age of Onset  . Alcohol abuse Father        called [place] per pt     HOME MEDICATIONS: Allergies as of 03/25/2019   No Known Allergies     Medication List  Accurate as of March 25, 2019 11:59 PM. If you have any questions, ask your nurse or doctor.        pantoprazole 40 MG tablet Commonly known as: PROTONIX Take 1qd (Plz sched with new provider)   tamsulosin 0.4 MG Caps capsule Commonly known as: FLOMAX Take 0.4 mg by mouth.   zolpidem 5 MG tablet Commonly known as: AMBIEN Take 1 tablet (5 mg total) by mouth at bedtime as needed for up to 14 days for sleep.         OBJECTIVE:   PHYSICAL EXAM: VS: BP 120/80   Pulse 87   Ht 6\' 1"  (1.854 m)   Wt 215 lb 12.8 oz (97.9 kg)   SpO2 94%   BMI 28.47 kg/m    EXAM: General: Pt appears  well and is in NAD  Neck: General: Supple without adenopathy. Thyroid: Bilateral thyroid nodules appreciated L>R   Lungs: Clear with good BS bilat with no rales, rhonchi, or wheezes  Heart: Auscultation: RRR.  Abdomen: Normoactive bowel sounds, soft, nontender, without masses or organomegaly palpable  Extremities:  BL LE: No pretibial edema normal ROM and strength.  Neuro: Cranial nerves: II - XII grossly intact  Motor: Normal strength throughout  Mental Status: Judgment, insight: Intact Orientation: Oriented to time, place, and person Mood and affect: No depression, anxiety, or agitation     DATA REVIEWED:  Results for SOTA, BECKUM (MRN [id number]) as of 03/27/2019 12:41  Ref. Range 03/25/2019 15:58  TSH Latest Ref Range: 0.35 - 4.50 uIU/mL 0.26 (L)  Triiodothyronine (T3) Latest Ref Range: 76 - 181 ng/dL 197 (H)  T4,Free(Direct) Latest Ref Range: 0.60 - 1.60 ng/dL 0.92     ASSESSMENT / PLAN / RECOMMENDATIONS:   1. Subclinical hyperthyroidism   -This is due to toxic nodule -Patient is clinically euthyroid -We again discussed treatment options with thionamides therapy, radioactive iodine ablation, or thyroidectomy. -We did discuss the need for lifelong LT-4 replacement following thyroidectomy -Repeat TFTs today show an elevation of T3, low TSH, with normal free T4.  But given age above 70 we discussed the increased risk of cardiac arrhythmias as well as increased bone resorption, I have recommended starting thionamides therapy, as we will not be able to proceed with any RAI ablation until his right superior nodule has been biopsied and confirmed benign cytology, but given this has to wait until at least 3 months from the prior inconclusive FNA attempt we will proceed with treatment at this time.   Medications  Methimazole 5 mg daily   2.  Multinodular goiter  - Patient has no local neck symptoms -Patient with benign cytology on FNA of the left superior, 2 left inferior  nodules -FNA of the right superior nodule revealed scant cellularity, we will order another FNA by May 2021.   Follow-up in 4 months  Signed electronically by: Mack Guise, MD  [facility] Endocrinology  [facility] [address] [place], [place] [address] Phone: [phone] FAX: [phone]      CC: [name], [place] [employer] [address] Phone: [phone]  Fax: [phone]   Return to Endocrinology clinic as below: Future Appointments  Date Time Provider [place]  04/09/2019 11:00 AM Tobi Bastos, RN [facility] None  07/25/2019  2:00 PM Ammiel Guiney, Melanie Crazier, MD [facility] None

## 2019-03-25 NOTE — Patient Instructions (Signed)
-  Please stop by the labs today  

## 2019-03-26 LAB — TSH: TSH: 0.26 u[IU]/mL — ABNORMAL LOW (ref 0.35–4.50)

## 2019-03-26 LAB — T3: T3, Total: 197 ng/dL — ABNORMAL HIGH (ref 76–181)

## 2019-03-26 LAB — T4, FREE: Free T4: 0.92 ng/dL (ref 0.60–1.60)

## 2019-03-27 MED ORDER — METHIMAZOLE 5 MG PO TABS: 5.0000 mg | ORAL_TABLET | Freq: Every day | ORAL | 1 refills | Status: DC

## 2019-04-09 ENCOUNTER — Other Ambulatory Visit: Payer: Self-pay | Admitting: *Deleted

## 2019-04-09 ENCOUNTER — Telehealth: Payer: Self-pay | Admitting: Family Medicine

## 2019-04-09 NOTE — Telephone Encounter (Signed)
Lattie Haw from [facility] called and was concerned about a statement from this patient and she reached out to the office.

## 2019-04-09 NOTE — Patient Outreach (Signed)
[facility]) Care Management  04/09/2019  David Hendricks 1950-03-16 [id number]   Telephone Assessment (Case Closure)  RN spoke briefly with pt today concerning update on his ongoing management of care related to his COPD. Pt indicated today he was not feeling well. RN further offered to assist with more inquiries. Pt states nothing I can do and indicated he is just "giving up". RN further attempted to inquire on related issues and offered possible counseling. Pt adamantly decline and does not wish to participate via [facility] services prior to ending the call. RN re-attempted another outreach call for clarity however no answer.  Based upon pt's states that he no long wishes to participate in [facility] services will close this case however will reach out to pt's provider's office [name], DO (PCP) and alerted the triage RN. RN received a call back from provider's office as RN explained pt's history of COPD and smoking with [facility] involvement over the last few months. Informed rep of the concerns with pt's statement and requested someone from the PCP office reach out to this pt for possible interventions if needed based upon possible counseling or depression at this time point.   Note office aware if pt wishes to participate with [facility] services to please submit another referral or contact this RN case manager to re-initiate [facility] services based upon pt's willingness to participate once again. Current plan of care not active.  Raina Mina, RN Care Management Coordinator [place] Office [phone]

## 2019-04-10 NOTE — Telephone Encounter (Signed)
I called the patient at the number listed on file and it was sent to VM. I had asked the patient to call for a follow up.  I then called the sister who is the emergency contact and she reports that the patient had recently went for a check up on his thyroid and he did not get a good report and it put him in a "depressed state". She is going to call him and call me back once she speaks to him.

## 2019-04-15 NOTE — Telephone Encounter (Signed)
I called the patient and he reports that he is just not feeling well. I offered him an appointment and he declined at this time. He does not want to come in at this time. FYI.

## 2019-04-20 ENCOUNTER — Other Ambulatory Visit: Payer: Self-pay | Admitting: Family Medicine

## 2019-04-23 ENCOUNTER — Encounter (HOSPITAL_COMMUNITY): Payer: Self-pay

## 2019-04-23 ENCOUNTER — Emergency Department (HOSPITAL_COMMUNITY): Payer: Medicare Other

## 2019-04-23 ENCOUNTER — Emergency Department (HOSPITAL_COMMUNITY)
Admission: EM | Admit: 2019-04-23 | Discharge: 2019-04-23 | Discharge status: Against Medical Advice | Payer: Medicare Other | Attending: Emergency Medicine | Admitting: Emergency Medicine

## 2019-04-23 DIAGNOSIS — R109 Unspecified abdominal pain: Secondary | ICD-10-CM | POA: Diagnosis present

## 2019-04-23 DIAGNOSIS — I2510 Atherosclerotic heart disease of native coronary artery without angina pectoris: Secondary | ICD-10-CM | POA: Diagnosis not present

## 2019-04-23 DIAGNOSIS — Z20822 Contact with and (suspected) exposure to covid-19: Secondary | ICD-10-CM | POA: Insufficient documentation

## 2019-04-23 DIAGNOSIS — Z79899 Other long term (current) drug therapy: Secondary | ICD-10-CM | POA: Insufficient documentation

## 2019-04-23 DIAGNOSIS — R52 Pain, unspecified: Secondary | ICD-10-CM | POA: Diagnosis not present

## 2019-04-23 DIAGNOSIS — N132 Hydronephrosis with renal and ureteral calculous obstruction: Secondary | ICD-10-CM | POA: Diagnosis not present

## 2019-04-23 DIAGNOSIS — N201 Calculus of ureter: Secondary | ICD-10-CM | POA: Insufficient documentation

## 2019-04-23 DIAGNOSIS — R42 Dizziness and giddiness: Secondary | ICD-10-CM | POA: Diagnosis not present

## 2019-04-23 DIAGNOSIS — E860 Dehydration: Secondary | ICD-10-CM | POA: Diagnosis not present

## 2019-04-23 DIAGNOSIS — N200 Calculus of kidney: Secondary | ICD-10-CM | POA: Diagnosis not present

## 2019-04-23 DIAGNOSIS — R0902 Hypoxemia: Secondary | ICD-10-CM | POA: Diagnosis not present

## 2019-04-23 DIAGNOSIS — R1084 Generalized abdominal pain: Secondary | ICD-10-CM | POA: Diagnosis not present

## 2019-04-23 DIAGNOSIS — F17210 Nicotine dependence, cigarettes, uncomplicated: Secondary | ICD-10-CM | POA: Insufficient documentation

## 2019-04-23 DIAGNOSIS — R55 Syncope and collapse: Secondary | ICD-10-CM | POA: Diagnosis not present

## 2019-04-23 DIAGNOSIS — R112 Nausea with vomiting, unspecified: Secondary | ICD-10-CM | POA: Insufficient documentation

## 2019-04-23 LAB — COMPREHENSIVE METABOLIC PANEL
ALT: 21 U/L (ref 0–44)
AST: 21 U/L (ref 15–41)
Albumin: 4.7 g/dL (ref 3.5–5.0)
Alkaline Phosphatase: 108 U/L (ref 38–126)
Anion gap: 10 (ref 5–15)
BUN: 14 mg/dL (ref 8–23)
CO2: 26 mmol/L (ref 22–32)
Calcium: 9.3 mg/dL (ref 8.9–10.3)
Chloride: 105 mmol/L (ref 98–111)
Creatinine, Ser: 1.08 mg/dL (ref 0.61–1.24)
GFR calc Af Amer: >60 mL/min (ref >60)
GFR calc non Af Amer: >60 mL/min (ref >60)
Glucose, Bld: 125 mg/dL — ABNORMAL HIGH (ref 70–99)
Potassium: 3.6 mmol/L (ref 3.5–5.1)
Sodium: 141 mmol/L (ref 135–145)
Total Bilirubin: 1.1 mg/dL (ref 0.3–1.2)
Total Protein: 7.9 g/dL (ref 6.5–8.1)

## 2019-04-23 LAB — URINALYSIS, ROUTINE W REFLEX MICROSCOPIC
Bacteria, UA: NONE SEEN
Bilirubin Urine: NEGATIVE
Glucose, UA: NEGATIVE mg/dL
Ketones, ur: 5 mg/dL — AB
Leukocytes,Ua: NEGATIVE
Nitrite: NEGATIVE
Protein, ur: 100 mg/dL — AB
RBC / HPF: >50 RBC/hpf — ABNORMAL HIGH (ref 0–5)
Specific Gravity, Urine: 1.042 — ABNORMAL HIGH (ref 1.005–1.030)
pH: 5 (ref 5.0–8.0)

## 2019-04-23 LAB — DIFFERENTIAL
Abs Immature Granulocytes: 0.04 10*3/uL (ref 0.00–0.07)
Basophils Absolute: 0.1 10*3/uL (ref 0.0–0.1)
Basophils Relative: 1 %
Eosinophils Absolute: 0.1 10*3/uL (ref 0.0–0.5)
Eosinophils Relative: 1 %
Immature Granulocytes: 0 %
Lymphocytes Relative: 15 %
Lymphs Abs: 1.9 10*3/uL (ref 0.7–4.0)
Monocytes Absolute: 0.7 10*3/uL (ref 0.1–1.0)
Monocytes Relative: 5 %
Neutro Abs: 10.1 10*3/uL — ABNORMAL HIGH (ref 1.7–7.7)
Neutrophils Relative %: 78 %

## 2019-04-23 LAB — CBC
HCT: 55.4 % — ABNORMAL HIGH (ref 39.0–52.0)
Hemoglobin: 18.4 g/dL — ABNORMAL HIGH (ref 13.0–17.0)
MCH: 28.1 pg (ref 26.0–34.0)
MCHC: 33.2 g/dL (ref 30.0–36.0)
MCV: 84.7 fL (ref 80.0–100.0)
Platelets: 179 10*3/uL (ref 150–400)
RBC: 6.54 MIL/uL — ABNORMAL HIGH (ref 4.22–5.81)
RDW: 13.4 % (ref 11.5–15.5)
WBC: 12.8 10*3/uL — ABNORMAL HIGH (ref 4.0–10.5)
nRBC: 0 % (ref 0.0–0.2)

## 2019-04-23 LAB — LIPASE, BLOOD: Lipase: 52 U/L — ABNORMAL HIGH (ref 11–51)

## 2019-04-23 LAB — CBG MONITORING, ED: Glucose-Capillary: 120 mg/dL — ABNORMAL HIGH (ref 70–99)

## 2019-04-23 MED ORDER — ONDANSETRON 4 MG PO TBDP
4.0000 mg | ORAL_TABLET | Freq: Once | ORAL | Status: AC | PRN
  Administered 2019-04-23: 4 mg via ORAL
  Filled 2019-04-23: qty 1

## 2019-04-23 MED ORDER — IOHEXOL 300 MG/ML  SOLN
100.0000 mL | Freq: Once | INTRAMUSCULAR | Status: AC | PRN
  Administered 2019-04-23: 100 mL via INTRAVENOUS

## 2019-04-23 MED ORDER — SODIUM CHLORIDE 0.9% FLUSH: 3.0000 mL | Freq: Once | INTRAVENOUS | Status: DC

## 2019-04-23 MED ORDER — SODIUM CHLORIDE 0.9 % IV BOLUS
1000.0000 mL | Freq: Once | INTRAVENOUS | Status: AC
  Administered 2019-04-23: 1000 mL via INTRAVENOUS

## 2019-04-23 MED ORDER — MORPHINE SULFATE (PF) 4 MG/ML IV SOLN
4.0000 mg | Freq: Once | INTRAVENOUS | Status: AC
  Administered 2019-04-23: 4 mg via INTRAVENOUS
  Filled 2019-04-23: qty 1

## 2019-04-23 MED ORDER — TAMSULOSIN HCL 0.4 MG PO CAPS: 0.4000 mg | ORAL_CAPSULE | Freq: Every day | ORAL | 2 refills | Status: DC

## 2019-04-23 MED ORDER — SODIUM CHLORIDE 0.9 % IV BOLUS
500.0000 mL | Freq: Once | INTRAVENOUS | Status: AC
  Administered 2019-04-23: 500 mL via INTRAVENOUS

## 2019-04-23 MED ORDER — ONDANSETRON 4 MG PO TBDP: 4.0000 mg | ORAL_TABLET | Freq: Three times a day (TID) | ORAL | 0 refills | Status: DC | PRN

## 2019-04-23 MED ORDER — HYDROCODONE-ACETAMINOPHEN 5-325 MG PO TABS: 1.0000 | ORAL_TABLET | Freq: Four times a day (QID) | ORAL | 0 refills | Status: DC | PRN

## 2019-04-23 NOTE — ED Notes (Signed)
Pt signed AMA form and made aware to return if symptoms persisted or became worse. Pt verbalized understanding. Pt last seen A&O and in no acute distress.

## 2019-04-23 NOTE — ED Notes (Signed)
Pt ambulated with no difficulty. During ambulation, pts o2sat decreased to 80%. MD/PA made aware.

## 2019-04-23 NOTE — Discharge Instructions (Addendum)
Dehydration  We suspect you may be dehydrated.  Symptoms of any other illness will be intensified and complicated by dehydration. Dehydration can also extend the duration of symptoms. Dehydration typically causes its own symptoms including lightheadedness, nausea, headaches, fatigue increased thirst, and generally feeling unwell. Drink plenty of fluids and get plenty of rest. You should be drinking at least half a liter of water every hour or two to stay hydrated. Electrolyte drinks (ex. Gatorade, Powerade, Pedialyte) are also encouraged. You should be drinking enough fluids to make your urine light yellow, almost clear. If this is not the case, you are not drinking enough water.  Your oxygen saturation was lower than normal today.  We recommended admission for further evaluation.   You have chosen to leave [place]. Should you change your mind, you are always welcome and encouraged to return to the ED. You are encouraged to follow-up with, at the very least, a primary care provider, or other similar medical professional on this matter.  Kidney Stone There is evidence of a kidney stone on the left side.  It appears as though it is on its way out.  Some kidney stones can take up to 30 days to pass. Hydration: Hydration is key to helping a kidney stone pass.  Have a goal of half a liter of water every hour or two. Antiinflammatory medications: Take 600 mg of ibuprofen every 6 hours or 440 mg (over the counter dose) to 500 mg (prescription dose) of naproxen every 12 hours for the next 3 days. After this time, these medications may be used as needed for pain. Take these medications with food to avoid upset stomach. Choose only one of these medications, do not take them together. Acetaminophen: Should you continue to have additional pain while taking the ibuprofen or naproxen, you may add in acetaminophen (generic for Tylenol) as needed. Your daily total maximum amount of acetaminophen from  all sources should be limited to 4000mg /day for persons without liver problems, or 2000mg /day for those with liver problems. Vicodin: May take Vicodin (hydrocodone-acetaminophen) as needed for severe pain.   Do not drive or perform other dangerous activities while taking this medication as it can cause drowsiness as well as changes in reaction time and judgement.   Please note that each pill of Vicodin contains 325 mg of acetaminophen (generic for Tylenol) and the above dosage limits apply. Tamsulosin: This medication is designed to help the stone pass.  Take this medication daily until stone passes. Nausea/vomiting: Use the ondansetron (generic for Zofran) for nausea or vomiting.  This medication may not prevent all vomiting or nausea, but can help facilitate better hydration. Things that can help with nausea/vomiting also include peppermint/menthol candies, vitamin B12, and ginger. Follow-up: Follow-up with the urologist as soon as possible on this matter. Return: Return to the ED for significantly increased pain, difficulty urinating, pain with urination, fever, uncontrolled vomiting, or any other major concerns.  For prescription assistance, may try using prescription discount sites or apps, such as goodrx.com

## 2019-04-23 NOTE — ED Triage Notes (Addendum)
Per EMS- Patient c/o abdominal pain x 2 days, diarrhea x 3 days. Patient stated he also has dizziness and lowered himself to the floor. No injuries.  During triage patient stated. Patient states he fell today and hit his right ear on a cabinet. No bleeding noted.  Patient states he was using the bathroom and turned quickly and lost his balance. No syncopal episode.

## 2019-04-23 NOTE — ED Provider Notes (Signed)
[facility] DEPT Provider Note   CSN: [id number] Arrival date & time: 04/23/19  1339     History Chief Complaint  Patient presents with  . Abdominal Pain  . Diarrhea  . Emesis    David Hendricks is a 69 y.o. male.  HPI      David Hendricks is a 69 y.o. male, with a history of anxiety, CAD, MI, tobacco use, presenting to the ED with abdominal pain beginning yesterday. Pain is in the LLQ, sharp, intermittent, nonradiating.  He had diarrhea beginning three days ago. This resolved yesterday and then the pain began. Nausea and nonbloody, nonbilious vomiting beginning today.  While sitting in a chair today, he started to feel lightheaded. Declined analgesia during the initial interview.  States he has been vaccinated for Covid.  Denies fever/chills, chest pain, shortness of breath, acute cough, urinary symptoms, hematochezia/melena, headache, neurologic deficits, current dizziness, or any other complaints.   Past Medical History:  Diagnosis Date  . Anxiety   . Arthritis   . Coronary artery disease    10/18 PCI/DES to pLAD, mild nonobstructive disease in the Lcx/RCA. Normal EF.   Marland Kitchen Dysrhythmia 2016   irregular heartbeat  . Gallstones   . Myocardial infarction ([place])   . Pneumonia   . Seasonal allergies     Patient Active Problem List   Diagnosis Date Noted  . 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  . Multinodular goiter 12/24/2018  . Subclinical hyperthyroidism 12/24/2018  . Current mild episode of major depressive disorder without prior episode ([place]) 11/27/2018  . BPH (benign prostatic hyperplasia) 11/20/2018  . HCAP (healthcare-associated pneumonia) 11/19/2018  . Cholecystitis 11/15/2018  . Goiter 11/15/2018  . Pulmonary nodules/lesions, multiple 11/15/2018  . Esophageal thickening 11/15/2018  . Abdominal pain 11/14/2018  . Rib pain on left side 06/21/2017  . Cigarette nicotine dependence without complication [id number]  . Vertigo 05/10/2017    . Old MI (myocardial infarction) 01/30/2017  . CAD (coronary artery disease) 01/30/2017  . Chest pain with moderate risk of acute coronary syndrome 01/23/2017  . Hyperlipidemia 10/10/2016  . History of non-ST elevation myocardial infarction (NSTEMI) 10/09/2016  . Tobacco abuse 08/28/2016  . Difficulty sleeping 04/22/2016  . Urinary dysfunction 04/22/2016  . Benign prostatic hyperplasia with urinary frequency 02/11/2016    Past Surgical History:  Procedure Laterality Date  . CORONARY ANGIOPLASTY  10/09/2016  . CORONARY STENT INTERVENTION N/A 10/09/2016   Procedure: CORONARY STENT INTERVENTION;  Surgeon: Nelva Bush, MD;  Location: [facility];  Service: Cardiovascular;  Laterality: N/A;  . INTRAVASCULAR ULTRASOUND/IVUS N/A 10/09/2016   Procedure: Intravascular Ultrasound/IVUS;  Surgeon: Nelva Bush, MD;  Location: [facility];  Service: Cardiovascular;  Laterality: N/A;  . IR CHOLANGIOGRAM EXISTING TUBE  11/18/2018  . IR PERC CHOLECYSTOSTOMY  11/16/2018  . LEFT HEART CATH AND CORONARY ANGIOGRAPHY N/A 10/09/2016   Procedure: LEFT HEART CATH AND CORONARY ANGIOGRAPHY;  Surgeon: Nelva Bush, MD;  Location: [facility];  Service: Cardiovascular;  Laterality: N/A;       Family History  Problem Relation Age of Onset  . Alcohol abuse Father        called [facility] per pt    Social History   Tobacco Use  . Smoking status: Current Every Day Smoker    Packs/day: 0.15    Years: 45.00    Pack years: 6.75    Types: Cigarettes    Start date: 01/09/1961  . Smokeless tobacco: Never Used  . Tobacco comment:  tobacco info given  Substance Use Topics  . Alcohol use: No  . Drug use: No    Home Medications Prior to Admission medications   Medication Sig Start Date End Date Taking? Authorizing Provider  Fexofenadine HCl (MUCINEX ALLERGY PO) Take 1 tablet by mouth daily as needed (allergies, sinuses).   Yes [provider]  methimazole  (TAPAZOLE) 5 MG tablet Take 1 tablet (5 mg total) by mouth daily. 03/27/19  Yes Shamleffer, Melanie Crazier, MD  naproxen sodium (ALEVE) 220 MG tablet Take 220 mg by mouth daily as needed (mild pain).   Yes [provider]  pantoprazole (PROTONIX) 40 MG tablet TAKE 1 TABLET BY MOUTH EVERY DAY Patient taking differently: Take 40 mg by mouth daily as needed (acid reflux).  04/21/19  Yes Libby Maw, MD  tamsulosin (FLOMAX) 0.4 MG CAPS capsule Take 0.4 mg by mouth.   Yes [provider]  HYDROcodone-acetaminophen (NORCO/VICODIN) 5-325 MG tablet Take 1 tablet by mouth every 6 (six) hours as needed for severe pain. 04/23/19   Zion Ta C, PA-C  ondansetron (ZOFRAN ODT) 4 MG disintegrating tablet Take 1 tablet (4 mg total) by mouth every 8 (eight) hours as needed for nausea or vomiting. 04/23/19   Jazziel Fitzsimmons C, PA-C  tamsulosin (FLOMAX) 0.4 MG CAPS capsule Take 1 capsule (0.4 mg total) by mouth daily. 04/23/19 07/22/19  Ameira Alessandrini, Helane Gunther, PA-C    Allergies    Patient has no known allergies.  Review of Systems   Review of Systems  Physical Exam Updated Vital Signs BP 113/84 (BP Location: Right Arm)   Pulse 62   Temp 97.8 F (36.6 C) (Oral)   Resp 20   Ht 6\' 1"  (1.854 m)   Wt 95.3 kg   SpO2 (!) 87%   BMI 27.71 kg/m   Physical Exam Vitals and nursing note reviewed.  Constitutional:      General: He is not in acute distress.    Appearance: He is well-developed. He is not diaphoretic.  HENT:     Head: Normocephalic and atraumatic.     Mouth/Throat:     Mouth: Mucous membranes are dry.     Pharynx: Oropharynx is clear.  Eyes:     Extraocular Movements: Extraocular movements intact.     Conjunctiva/sclera: Conjunctivae normal.     Pupils: Pupils are equal, round, and reactive to light.  Cardiovascular:     Rate and Rhythm: Normal rate and regular rhythm.     Pulses: Normal pulses.          Radial pulses are 2+ on the right side and 2+ on the left side.        Posterior tibial pulses are 2+ on the right side and 2+ on the left side.     Heart sounds: Normal heart sounds.     Comments: Tactile temperature in the extremities appropriate and equal bilaterally. Pulmonary:     Effort: Pulmonary effort is normal. No respiratory distress.     Breath sounds: Normal breath sounds.     Comments: During my examination, I verified the patient's room air hypoxia.  He dropped to 85 to 87% on room air.  Was able to maintain 95 to 97% on 2 L supplemental O2. No increased work of breathing.  Speaks in full sentences without difficulty. Abdominal:     Palpations: Abdomen is soft.     Tenderness: There is abdominal tenderness. There is no right CVA tenderness, left CVA tenderness or guarding.  Musculoskeletal:     Cervical back: Neck supple.     Right lower leg: No edema.     Left lower leg: No edema.  Lymphadenopathy:     Cervical: No cervical adenopathy.  Skin:    General: Skin is warm and dry.  Neurological:     Mental Status: He is alert and oriented to person, place, and time.     Comments: No noted acute cognitive deficit. Sensation grossly intact to light touch in the extremities.   Grip strengths equal bilaterally.   Strength 5/5 in all extremities.  Coordination intact.  Cranial nerves III-XII grossly intact.  Handles oral secretions without noted difficulty.  No noted phonation or speech deficit. No facial droop.   Psychiatric:        Mood and Affect: Mood and affect normal.        Speech: Speech normal.        Behavior: Behavior normal.     ED Results / Procedures / Treatments   Labs (all labs ordered are listed, but only abnormal results are displayed) Labs Reviewed  LIPASE, BLOOD - Abnormal; Notable for the following components:      Result Value   Lipase 52 (*)    All other components within normal limits  COMPREHENSIVE METABOLIC PANEL - Abnormal; Notable for the following components:   Glucose, Bld 125 (*)    All other  components within normal limits  CBC - Abnormal; Notable for the following components:   WBC 12.8 (*)    RBC 6.54 (*)    Hemoglobin 18.4 (*)    HCT 55.4 (*)    All other components within normal limits  URINALYSIS, ROUTINE W REFLEX MICROSCOPIC - Abnormal; Notable for the following components:   Color, Urine AMBER (*)    APPearance CLOUDY (*)    Specific Gravity, Urine 1.042 (*)    Hgb urine dipstick LARGE (*)    Ketones, ur 5 (*)    Protein, ur 100 (*)    RBC / HPF >50 (*)    All other components within normal limits  DIFFERENTIAL - Abnormal; Notable for the following components:   Neutro Abs 10.1 (*)    All other components within normal limits  CBG MONITORING, ED - Abnormal; Notable for the following components:   Glucose-Capillary 120 (*)    All other components within normal limits  SARS CORONAVIRUS 2 (TAT 6-24 HRS)    EKG EKG Interpretation  Date/Time:  Wednesday April 23 2019 21:08:42 EDT Ventricular Rate:  82 PR Interval:    QRS Duration: 114 QT Interval:  383 QTC Calculation: 448 R Axis:   -72 Text Interpretation: Sinus rhythm Left anterior fascicular block Abnormal R-wave progression, early transition Confirmed by Madalyn Rob [id number]) on 04/23/2019 9:28:56 PM   Radiology CT Head Wo Contrast  Result Date: 04/23/2019 CLINICAL DATA:  Dizziness, nonspecific EXAM: CT HEAD WITHOUT CONTRAST TECHNIQUE: Contiguous axial images were obtained from the base of the skull through the vertex without intravenous contrast. COMPARISON:  CT head 04/02/2017 FINDINGS: Brain: No evidence of acute infarction, hemorrhage, hydrocephalus, extra-axial collection or mass lesion/mass effect. Cerebellar asymmetry is similar to comparison exams and may be the result of prior infarct or developmental. Prominence of the ventricles, cisterns and sulci compatible with parenchymal volume loss. Patchy areas of white matter hypoattenuation are most compatible with chronic microvascular angiopathy.  Vascular: Atherosclerotic calcification of the carotid siphons. No hyperdense vessel. Skull: No calvarial fracture or suspicious osseous lesion. No scalp swelling or hematoma.  Sinuses/Orbits: Paranasal sinuses and mastoid air cells are predominantly clear. Orbital structures are unremarkable aside from prior lens extractions. Other: None IMPRESSION: 1. No acute intracranial abnormality. 2. Stable parenchymal volume loss and chronic microvascular angiopathy. 3. Stable cerebellar asymmetry, may reflect sequela of prior infarct versus developmental. Electronically Signed   By: Lovena Le M.D.   On: 04/23/2019 18:33   CT ABDOMEN PELVIS W CONTRAST  Result Date: 04/23/2019 CLINICAL DATA:  Left-sided abdominal pain with diarrhea for several days EXAM: CT ABDOMEN AND PELVIS WITH CONTRAST TECHNIQUE: Multidetector CT imaging of the abdomen and pelvis was performed using the standard protocol following bolus administration of intravenous contrast. CONTRAST:  112mL OMNIPAQUE IOHEXOL 300 MG/ML  SOLN COMPARISON:  11/13/2018 FINDINGS: Lower chest: No acute abnormality. Stable right middle lobe nodule is seen measuring 2 mm. No new nodules are seen. Hepatobiliary: No focal liver abnormality is seen. No gallstones, gallbladder wall thickening, or biliary dilatation. Pancreas: Unremarkable. No pancreatic ductal dilatation or surrounding inflammatory changes. Spleen: Calcified granuloma is again seen within the spleen. Adrenals/Urinary Tract: Adrenal glands are within normal limits. Kidneys demonstrate a normal enhancement pattern. Tiny nonobstructing right renal calculi are noted. Tiny nonobstructing left renal stones are noted as well. Mild hydronephrosis is seen with hydroureter. A small 4 mm stone is noted in left renal pelvis. A more irregular larger stone measuring 8 mm is noted in the mid left ureter causing the obstructive change. The more distal ureter appears within normal limits. The bladder is well distended.  Stomach/Bowel: Colon shows no obstructive or inflammatory changes. The appendix is within normal limits. No small bowel abnormality is noted. Stomach is unremarkable. Vascular/Lymphatic: Atherosclerotic calcifications of the abdominal aorta are noted. 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 No lymphadenopathy is noted. Reproductive: Prostate is unremarkable. Other: No abdominal wall hernia or abnormality. No abdominopelvic ascites. Musculoskeletal: Degenerative changes of lumbar spine are noted. IMPRESSION: Left mid ureteral stone with moderate hydronephrosis. Left renal pelvic stone as well as multiple bilateral intrarenal nonobstructing stones as described. Changes of prior granulomatous disease. 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 Aortic Atherosclerosis (ICD10-I70.0). Electronically Signed   By: Inez Catalina M.D.   On: 04/23/2019 18:30   DG Chest Portable 1 View  Result Date: 04/23/2019 CLINICAL DATA:  Hypoxia EXAM: PORTABLE CHEST 1 VIEW COMPARISON:  11/19/2018 FINDINGS: Likely chronic interstitial prominence. No consolidation or edema. No pleural effusion or pneumothorax. Cardiomediastinal contours are within normal limits. IMPRESSION: No acute process in the chest. Electronically Signed   By: Macy Mis M.D.   On: 04/23/2019 17:34    Procedures Procedures (including critical care time)  Medications Ordered in ED Medications  ondansetron (ZOFRAN-ODT) disintegrating tablet 4 mg (4 mg Oral Given 04/23/19 1428)  sodium chloride 0.9 % bolus 500 mL (0 mLs Intravenous Stopped 04/23/19 1809)  morphine 4 MG/ML injection 4 mg (4 mg Intravenous Given 04/23/19 1810)  sodium chloride 0.9 % bolus 1,000 mL (0 mLs Intravenous Stopped 04/23/19 2105)  iohexol (OMNIPAQUE) 300 MG/ML solution 100 mL (100 mLs Intravenous Contrast Given 04/23/19 1749)    ED Course  I have reviewed the triage vital signs and the nursing notes.  Pertinent labs & imaging results that were available during my  care of the patient were reviewed by me and considered in my medical decision making (see chart for details).  Clinical Course as of Apr 22 2128  Wed Apr 23, 2019  2048 Spoke with Dr. Diona Fanti, urology.  Discussed CT findings with renal and  ureteral stones, their locations, and their sizes. We also discussed the patient's post void residual of 380 cc.  He states this can be chronic in some people.  As long as he is urinating, he does not need a Foley catheter.  Patient can follow-up with him in the office.   [SJ]  2109 I was present while we walked the patient.  Patient was steady with ambulation.  No increased work of breathing. Patient even jumped up, turned in a circle, danced a little, and said, "See? I'm all good." Unfortunately, patient was noted to desat to 80% on room air.  It should be noted that the patient did not have any increased work of breathing and was speaking in full sentences without noted difficulty.   [SJ]    Clinical Course User Index [SJ] Ruben Mahler, Helane Gunther, PA-C   MDM Rules/Calculators/A&P                      Patient presents primarily complaining of abdominal pain with vomiting. Patient is nontoxic appearing, afebrile, not tachycardic, not tachypneic, not hypotensive, and is in no apparent distress.  He was noted to be hypoxic down into the 80s, however, did not show any signs of difficulty breathing.  I have reviewed the patient's chart to obtain more information.  I reviewed and interpreted the patient's labs and radiological studies. Chest x-ray without acute abnormality. CT abdomen/pelvis with 8 mm ureteral stone and hydronephrosis.  No other acute abnormality. Elevation in WBC count, RBCs, and hemoglobin could be due to hemoconcentration.  Patient was quite dry upon initial presentation and clinically shows signs of dehydration.  I suspect this may also be why patient became lightheaded earlier today. Patient was able to urinate, however, he had a 380 cc postvoid  residual.  This was discussed with the urologist.  Admission was recommended for the patient due to his hypoxia.  Patient listened to the reasoning, but declined.  He states he will call his PCP in the morning.  If he changes his mind, he will return to the ED.  I made it clear to him that he is welcome and encouraged to do so.  Patient is chronically on tamsulosin already, however, he states he took his last pill today.  I renewed this prescription for him.  Findings and plan of care discussed with Gerlene Fee, MD. Dr. Sedonia Small personally evaluated and examined this patient.   Vitals:   04/23/19 1421 04/23/19 1541 04/23/19 1600 04/23/19 1630  BP:  113/84 129/75 136/70  Pulse:  62 75 75  Resp:  20 15 19   Temp:  97.8 F (36.6 C)    TempSrc:  Oral    SpO2:  (!) 87% 97% 92%  Weight: 95.3 kg     Height: 6\' 1"  (1.854 m)      Vitals:   04/23/19 1830 04/23/19 1945 04/23/19 2030 04/23/19 2100  BP: 124/65 119/78 136/74 137/75  Pulse: 79 76 70 70  Resp: 18 16 (!) 22 20  Temp:      TempSrc:      SpO2: 93% 97% 91% 90%  Weight:      Height:       Orthostatic VS for the past 24 hrs:  BP- Lying Pulse- Lying BP- Sitting Pulse- Sitting BP- Standing at 0 minutes Pulse- Standing at 0 minutes  04/23/19 1943 113/78 72 119/78 75 125/66 90     Final Clinical Impression(s) / ED Diagnoses Final diagnoses:  Non-intractable vomiting with  nausea, unspecified vomiting type  Dehydration  Ureterolithiasis    Rx / DC Orders ED Discharge Orders         Ordered    ondansetron (ZOFRAN ODT) 4 MG disintegrating tablet  Every 8 hours PRN     04/23/19 2057    HYDROcodone-acetaminophen (NORCO/VICODIN) 5-325 MG tablet  Every 6 hours PRN     04/23/19 2103    tamsulosin (FLOMAX) 0.4 MG CAPS capsule  Daily     04/23/19 2113           Lorayne Bender, PA-C 04/23/19 2130    Maudie Flakes, MD 04/23/19 [phone]

## 2019-04-23 NOTE — ED Notes (Addendum)
Bladder scan showed 386 mL post voiding

## 2019-04-23 NOTE — ED Notes (Signed)
Pt transported to CT ?

## 2019-04-24 LAB — SARS CORONAVIRUS 2 (TAT 6-24 HRS): SARS Coronavirus 2: NEGATIVE

## 2019-04-28 NOTE — Telephone Encounter (Signed)
Spoke to pt told him Rx was sent to pharmacy but need to schedule follow up. Pt verbalized understanding and will call back and schedule.

## 2019-05-01 ENCOUNTER — Telehealth: Payer: Self-pay | Admitting: General Practice

## 2019-05-01 NOTE — Telephone Encounter (Signed)
Our department got a call from this patient yesterday requesting help with his follow up appointment, after I looked at the chart I believe he thought he was contacting the office to make an appointment but got to the wrong place. Could the office call and make an appointment with him?   Our office received the note above through a staff message.    I called the patient this morning.   I asked him if he was trying to get in touch with [facility].  Patient stated that he had a lot going on.  Stated that there was nodules found in his lungs and that he had a "tube" that has fallen out.  Stated that he was instructed to call [facility] but not sure why.  Patient stated he was very aggravated.  I informed him that he probably needs an appointment and that his provider is no longer at [facility] but we could get him in with another provider to be seen. I asked patient if he would like to transfer care to another provider at [facility]?  Patient stated he did not want to see [name] anymore.  I informed him that he would not see [name] but would establish with another MD.  Patient stated he did not wish to see any provider at [facility]. Patient stated he was going to find a different primary care provider. I asked patient if he would like to be seen at another [facility] location.  Patient disconnected the phone call at that time.

## 2019-05-02 NOTE — Telephone Encounter (Signed)
Spoke with the pt and he was hoping to transfer care to Dr. Alysia Penna or Carolann Littler.    David Hendricks are there anyway we can help the pt one this?

## 2019-05-02 NOTE — Telephone Encounter (Signed)
Spoke with Mr. Riegel. I will call him back again today before 5 pm.

## 2019-05-02 NOTE — Telephone Encounter (Signed)
Patient sister called and wants to let us know that David Hendricks is willing to try [facility] again. She reports she will be the contact person for him and he is willing to get some support. Do we need to place a new referral? Please advise.

## 2019-05-02 NOTE — Telephone Encounter (Signed)
I'm confused.  My medical assistant, Barnet Pall, spoke with his sister this afternoon and he wanted to continue care with me in [place].  We couldn't see him initially due to credentialing issue with his insurance, but that has been fixed.  Can we please clarify where he plans to continue care?  Thanks!

## 2019-05-05 ENCOUNTER — Emergency Department (HOSPITAL_COMMUNITY)
Admission: EM | Admit: 2019-05-05 | Discharge: 2019-05-05 | Disposition: A | Discharge status: Home / Self Care | Payer: Medicare Other | Attending: Emergency Medicine | Admitting: Emergency Medicine

## 2019-05-05 ENCOUNTER — Other Ambulatory Visit: Payer: Self-pay

## 2019-05-05 ENCOUNTER — Encounter (HOSPITAL_COMMUNITY): Payer: Self-pay

## 2019-05-05 DIAGNOSIS — R1032 Left lower quadrant pain: Secondary | ICD-10-CM | POA: Diagnosis not present

## 2019-05-05 DIAGNOSIS — R0902 Hypoxemia: Secondary | ICD-10-CM | POA: Diagnosis not present

## 2019-05-05 DIAGNOSIS — Z5321 Procedure and treatment not carried out due to patient leaving prior to being seen by health care provider: Secondary | ICD-10-CM | POA: Insufficient documentation

## 2019-05-05 DIAGNOSIS — R110 Nausea: Secondary | ICD-10-CM | POA: Insufficient documentation

## 2019-05-05 DIAGNOSIS — R52 Pain, unspecified: Secondary | ICD-10-CM | POA: Diagnosis not present

## 2019-05-05 DIAGNOSIS — N200 Calculus of kidney: Secondary | ICD-10-CM | POA: Diagnosis not present

## 2019-05-05 NOTE — ED Triage Notes (Signed)
Pt BIB [facility] complaining of L flank pain. Hx kidney stone. Pain is intermittent. Worsened around 10p. 8/10. Ambulatory. Endorses nausea. Denies vomiting and diarrhea.

## 2019-05-07 NOTE — Progress Notes (Signed)
Cardiology Office Note   Date:  05/08/2019   ID:  David Hendricks, DOB September 08, 1950, MRN [id number]  PCP:  Patient, No Pcp Per    No chief complaint on file.  CAD  Wt Readings from Last 3 Encounters:  05/08/19 219 lb 12.8 oz (99.7 kg)  04/23/19 210 lb (95.3 kg)  03/25/19 215 lb 12.8 oz (97.9 kg)       History of Present Illness: David Hendricks is a 69 y.o. male  with a history of tobacco abuse. In 10/18,He had intermittent chest discomfort which he described as a pressure, that lasted anywhere from 30-45 minutes. He thought it was gas. He had more discomfort a few hours later. It moved to his left arm and left side of his face. It persisted even while he was at rest. He came to the ER via EMS.  InitialPOCtroponin was positive. A lab troponin was also positive. He had some relief of his chest discomfort with nitroglycerin.  Cath showed: 1. Severe single-vessel coronary artery disease with serial 50% ostial and 90% proximal/mid LAD stenoses. 2. Mild, nonobstructive CAD involving the LCx and RCA. 3. Subtle mid and apical anterior hypokinesis with otherwise preserved left ventricular contraction; LVEF low normal at 50-55%. 4. Upper normal left ventricular filling pressure. Successful IVUS guided PCI to the proximal/mid LAD with placement of a Resolute Onyx 3.5 x 22 mm drug-eluting stent, post dilated proximally with a 4.0 mm [place] balloon. The first diagonal branch was jailed by the stent but demonstrates only mild ostial stenosis post PCI with TIMI-3 flow."  "He was admitted with chest pain in Jan 2019.  Echo showed :  Left ventricle: The cavity size was normal. Systolic function was normal. The estimated ejection fraction was in the range of 55% to 60%. Wall motion was normal; there were no regional wall motion abnormalities. Doppler parameters are consistent with abnormal left ventricular relaxation (grade 1 diastolic dysfunction). There was no evidence of elevated  ventricular filling pressure by Doppler parameters. - Aortic valve: There was mild regurgitation. - Mitral valve: There was no regurgitation. - Right ventricle: The cavity size was moderately dilated. Wall thickness was normal. Systolic function was normal. - Tricuspid valve: There was moderate regurgitation. - Pulmonary arteries: Systolic pressure was mildly increased. PA peak pressure: 32 mm Hg (S). - Inferior vena cava: The vessel was normal in size. - Pericardium, extracardiac: There was no pericardial effusion.   He was admitted in Jan 2019 with chest pain after coughing.  He ruled out for MI.  There was some question of compliance with medication.  "  Difficulty stopping smoking. Tried patches without success. Still smoking 2 ppd.   Since the last visit, he has had persistent SHOB and leg cramps.    He had a kidney stone on the left 2 weeks ago.     Past Medical History:  Diagnosis Date  . Anxiety   . Arthritis   . Coronary artery disease    10/18 PCI/DES to pLAD, mild nonobstructive disease in the Lcx/RCA. Normal EF.   Marland Kitchen Dysrhythmia 2016   irregular heartbeat  . Gallstones   . Myocardial infarction ([place])   . Pneumonia   . Seasonal allergies     Past Surgical History:  Procedure Laterality Date  . CORONARY ANGIOPLASTY  10/09/2016  . CORONARY STENT INTERVENTION N/A 10/09/2016   Procedure: CORONARY STENT INTERVENTION;  Surgeon: Nelva Bush, MD;  Location: [place] [facility];  Service: Cardiovascular;  Laterality: N/A;  . INTRAVASCULAR  ULTRASOUND/IVUS N/A 10/09/2016   Procedure: Intravascular Ultrasound/IVUS;  Surgeon: Nelva Bush, MD;  Location: [place] [facility];  Service: Cardiovascular;  Laterality: N/A;  . IR CHOLANGIOGRAM EXISTING TUBE  11/18/2018  . IR PERC CHOLECYSTOSTOMY  11/16/2018  . LEFT HEART CATH AND CORONARY ANGIOGRAPHY N/A 10/09/2016   Procedure: LEFT HEART CATH AND CORONARY ANGIOGRAPHY;  Surgeon: Nelva Bush, MD;  Location: [facility] [facility];  Service: Cardiovascular;  Laterality: N/A;     Current Outpatient Medications  Medication Sig Dispense Refill  . Fexofenadine HCl (MUCINEX ALLERGY PO) Take 1 tablet by mouth daily as needed (allergies, sinuses).    Marland Kitchen HYDROcodone-acetaminophen (NORCO/VICODIN) 5-325 MG tablet Take 1 tablet by mouth every 6 (six) hours as needed for severe pain. 10 tablet 0  . methimazole (TAPAZOLE) 5 MG tablet Take 1 tablet (5 mg total) by mouth daily. 90 tablet 1  . naproxen sodium (ALEVE) 220 MG tablet Take 220 mg by mouth daily as needed (mild pain).    . ondansetron (ZOFRAN ODT) 4 MG disintegrating tablet Take 1 tablet (4 mg total) by mouth every 8 (eight) hours as needed for nausea or vomiting. 20 tablet 0  . pantoprazole (PROTONIX) 40 MG tablet TAKE 1 TABLET BY MOUTH EVERY DAY 30 tablet 0  . tamsulosin (FLOMAX) 0.4 MG CAPS capsule Take 0.4 mg by mouth.    . tamsulosin (FLOMAX) 0.4 MG CAPS capsule Take 1 capsule (0.4 mg total) by mouth daily. 30 capsule 2   No current facility-administered medications for this visit.    Allergies:   Patient has no known allergies.    Social History:  The patient  reports that he has been smoking cigarettes. He started smoking about 58 years ago. He has a 6.75 pack-year smoking history. He has never used smokeless tobacco. He reports that he does not drink alcohol or use drugs.   Family History:  The patient's family history includes Alcohol abuse in his father.    ROS:  Please see the history of present illness.   Otherwise, review of systems are positive for mild weight gain.   All other systems are reviewed and negative.    PHYSICAL EXAM: VS:  BP 114/60   Pulse (!) 101   Ht 6\' 1"  (1.854 m)   Wt 219 lb 12.8 oz (99.7 kg)   SpO2 95%   BMI 29.00 kg/m  , BMI Body mass index is 29 kg/m. GEN: Well nourished, well developed, in no acute distress  HEENT: normal  Neck: no JVD, carotid bruits, or masses Cardiac: RRR; no murmurs, rubs, or  gallops,no edema  Respiratory:  clear to auscultation bilaterally, normal work of breathing GI: soft, nontender, nondistended, + BS MS: no deformity or atrophy ; 2+ PT pulses bilaterally Skin: warm and dry, no rash Neuro:  Strength and sensation are intact Psych: euthymic mood, full affect   EKG:   The ekg ordered 04/25/19 demonstrates NSR, nonspecific ST changes   Recent Labs: 11/22/2018: Magnesium 2.2 03/25/2019: TSH 0.26 04/23/2019: ALT 21; BUN 14; Creatinine, Ser 1.08; Hemoglobin 18.4; Platelets 179; Potassium 3.6; Sodium 141   Lipid Panel    Component Value Date/Time   CHOL 106 12/04/2016 1029   TRIG 113 12/04/2016 1029   HDL 45 12/04/2016 1029   CHOLHDL 2.4 12/04/2016 1029   CHOLHDL 5.0 10/09/2016 2009   VLDL 20 10/09/2016 2009   LDLCALC 38 12/04/2016 1029     Other studies Reviewed: Additional studies/ records that were reviewed today with results demonstrating: Prior  cath records reviewed; Abdominal CT reviewed from 4/21.   ASSESSMENT AND PLAN:  1.   CAD: He is taking aspirin 81 mg daily. No angina.  COntinue aggressive secondary prevention. 2.   Tobacco abuse: He has had trouble quitting. He will let us know when he is ready to quit and we can try to prescribe something to help. 3.   Hyperlipidemia: LDL above target.   Restart  Atorvastatin 80 mg daily. Lipids and liver tests in 3 months.   4.   Old MI: No CHF sx Following up for thyroid nodules.   Current medicines are reviewed at length with the patient today.  The patient concerns regarding his medicines were addressed.  The following changes have been made:  Start atorvastatin  Labs/ tests ordered today include:  No orders of the defined types were placed in this encounter.   Recommend 150 minutes/week of aerobic exercise Low fat, low carb, high fiber diet recommended  Disposition:   FU in 1 year   Signed, Larae Grooms, MD  05/08/2019 4:29 PM    [facility] [place], [place], [place]  [address] Phone: [phone]; Fax: [phone]

## 2019-05-08 ENCOUNTER — Ambulatory Visit (INDEPENDENT_AMBULATORY_CARE_PROVIDER_SITE_OTHER): Payer: Medicare Other | Admitting: Interventional Cardiology

## 2019-05-08 ENCOUNTER — Other Ambulatory Visit: Payer: Self-pay

## 2019-05-08 ENCOUNTER — Encounter: Payer: Self-pay | Admitting: Interventional Cardiology

## 2019-05-08 VITALS — BP 114/60 | HR 101 | Ht 73.0 in | Wt 219.8 lb

## 2019-05-08 DIAGNOSIS — Z720 Tobacco use: Secondary | ICD-10-CM

## 2019-05-08 DIAGNOSIS — E7800 Pure hypercholesterolemia, unspecified: Secondary | ICD-10-CM | POA: Diagnosis not present

## 2019-05-08 DIAGNOSIS — I25118 Atherosclerotic heart disease of native coronary artery with other forms of angina pectoris: Secondary | ICD-10-CM | POA: Diagnosis not present

## 2019-05-08 DIAGNOSIS — I252 Old myocardial infarction: Secondary | ICD-10-CM

## 2019-05-08 MED ORDER — ATORVASTATIN CALCIUM 80 MG PO TABS: 80.0000 mg | ORAL_TABLET | Freq: Every day | ORAL | 3 refills | Status: DC

## 2019-05-08 NOTE — Patient Instructions (Addendum)
Medication Instructions:  Your physician has recommended you make the following change in your medication:   START: atorvastatin (lipitor) 80 mg tablet: Take 1 tablet by mouth once a day  *If you need a refill on your cardiac medications before your next appointment, please call your pharmacy*   Lab Work: Your physician recommends that you return for a FASTING lipid profile and liver function panel in 3 months   If you have labs (blood work) drawn today and your tests are completely normal, you will receive your results only by: Marland Kitchen MyChart Message (if you have MyChart) OR . A paper copy in the mail If you have any lab test that is abnormal or we need to change your treatment, we will call you to review the results.   Testing/Procedures: None ordered   Follow-Up: At [facility], you and your health needs are our priority.  As part of our continuing mission to provide you with exceptional heart care, we have created designated Provider Care Teams.  These Care Teams include your primary Cardiologist (physician) and Advanced Practice Providers (APPs -  Physician Assistants and Nurse Practitioners) who all work together to provide you with the care you need, when you need it.  We recommend signing up for the patient portal called "MyChart".  Sign up information is provided on this After Visit Summary.  MyChart is used to connect with patients for Virtual Visits (Telemedicine).  Patients are able to view lab/test results, encounter notes, upcoming appointments, etc.  Non-urgent messages can be sent to your provider as well.   To learn more about what you can do with MyChart, go to [email or web address].    Your next appointment:   12 month(s)  The format for your next appointment:   In Person  Provider:   You may see Casandra Doffing, MD or one of the following Advanced Practice Providers on your designated Care Team:    Melina Copa, PA-C  Ermalinda Barrios, PA-C    Other  Instructions

## 2019-05-12 ENCOUNTER — Other Ambulatory Visit: Payer: Medicare Other

## 2019-05-21 ENCOUNTER — Other Ambulatory Visit: Payer: Self-pay | Admitting: Family Medicine

## 2019-05-21 NOTE — Telephone Encounter (Signed)
Spoke with patient who states that he does not want to return to [place] he is in the process of trying to find another doctor. Patient verbally understood that if he will no longer be seeing any of the doctors at [facility] or Dr. Zigmund Daniel we will not be able to continue to refill meds. He will need to find new provider for next refills.

## 2019-05-23 DIAGNOSIS — M25511 Pain in right shoulder: Secondary | ICD-10-CM | POA: Diagnosis not present

## 2019-05-23 DIAGNOSIS — G8929 Other chronic pain: Secondary | ICD-10-CM | POA: Diagnosis not present

## 2019-05-30 IMAGING — DX DG CHEST 2V
3 series · 3 of 3 positions shown · non-contrast
Comparison: Chest x-ray dated 10/09/2016.

CLINICAL DATA: Lower left-sided chest pain. History of MI in
[REDACTED].

EXAM:
CHEST  2 VIEW

[w chest lat]
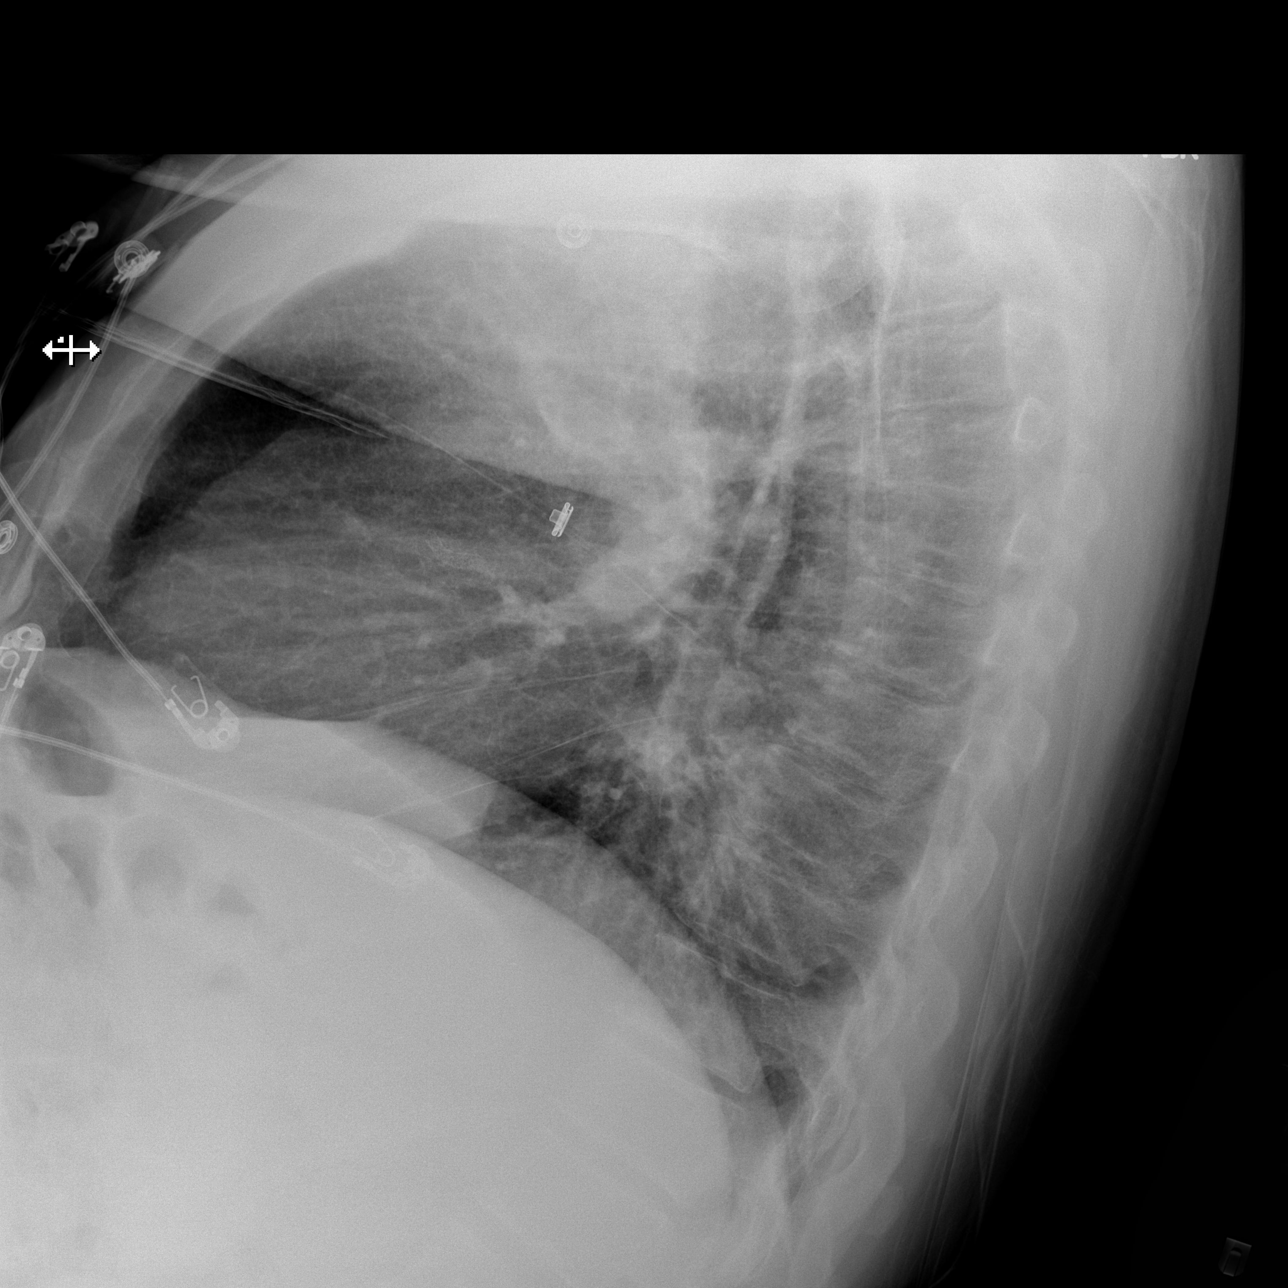

[x chest ap (1 of 2)]
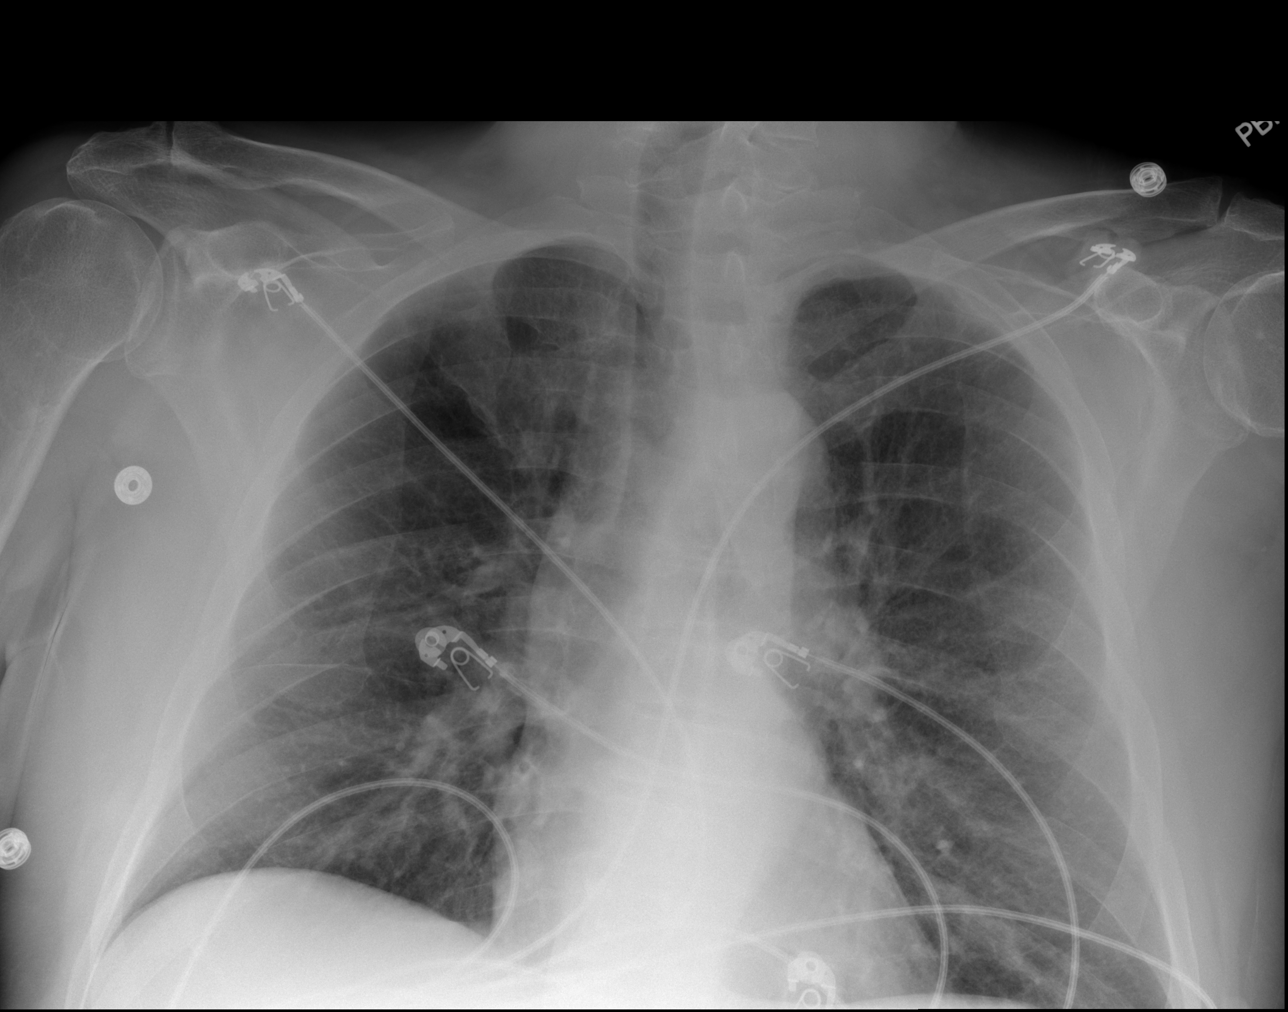

[x chest ap (2 of 2)]
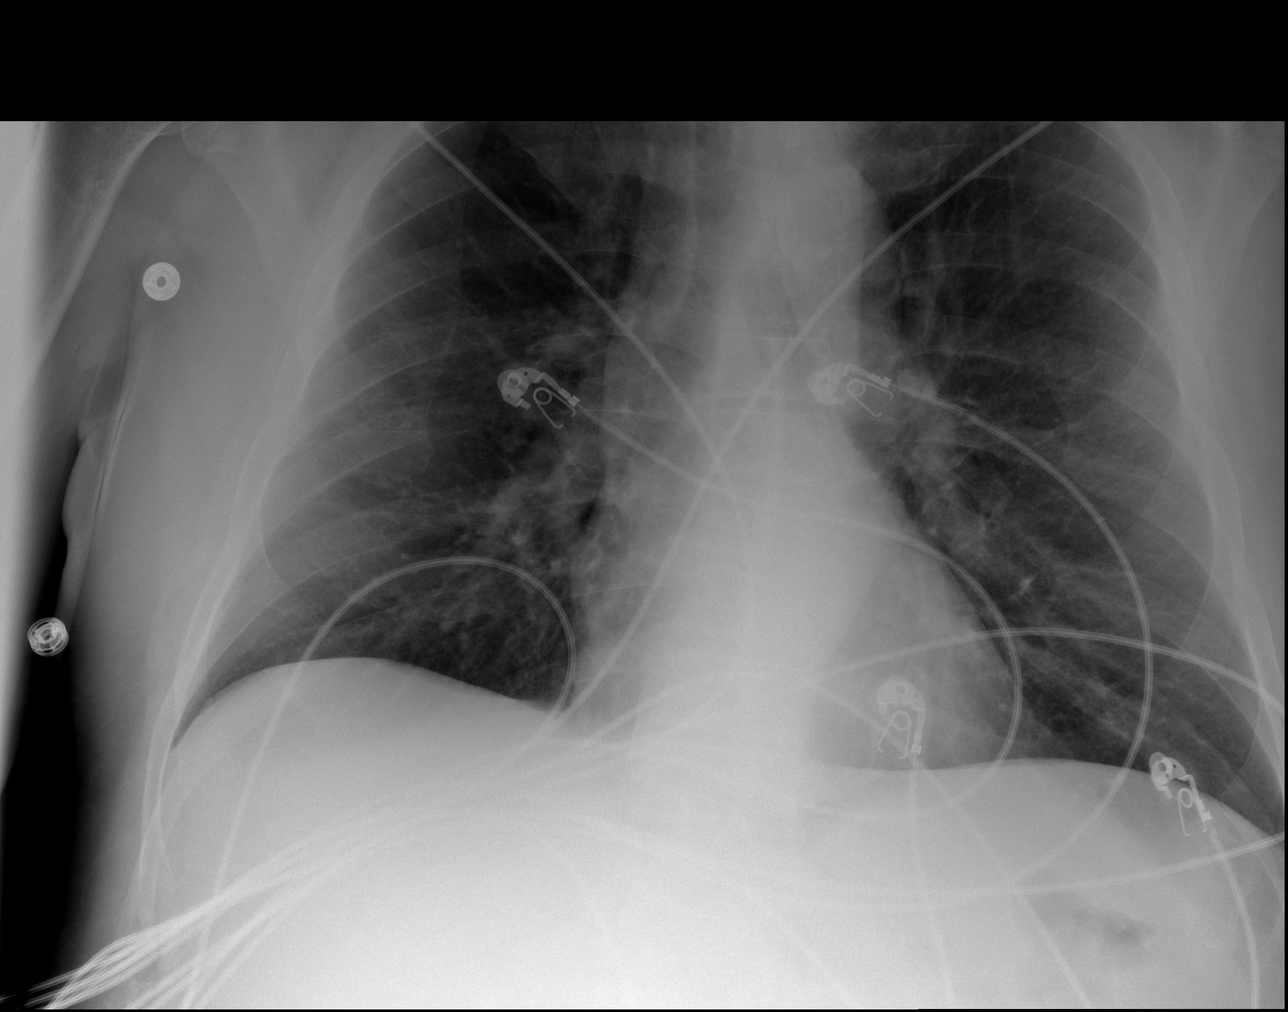

[3 of 3 positions shown; findings below may reference images not displayed]

FINDINGS: The heart size and mediastinal contours are within normal limits.
Atherosclerotic changes noted at the aortic arch. Both lungs are
clear. No pleural effusion or pneumothorax seen. The visualized
skeletal structures are unremarkable.
IMPRESSION: No active cardiopulmonary disease. No evidence of pneumonia or
pulmonary edema.

Aortic atherosclerosis

## 2019-06-02 ENCOUNTER — Ambulatory Visit (INDEPENDENT_AMBULATORY_CARE_PROVIDER_SITE_OTHER): Payer: Medicare Other | Admitting: Emergency Medicine

## 2019-06-02 ENCOUNTER — Encounter: Payer: Self-pay | Admitting: Emergency Medicine

## 2019-06-02 ENCOUNTER — Other Ambulatory Visit: Payer: Self-pay

## 2019-06-02 VITALS — BP 120/60 | HR 87 | Temp 96.7°F | Ht 73.0 in | Wt 218.5 lb

## 2019-06-02 DIAGNOSIS — I25118 Atherosclerotic heart disease of native coronary artery with other forms of angina pectoris: Secondary | ICD-10-CM | POA: Diagnosis not present

## 2019-06-02 DIAGNOSIS — I700 Atherosclerosis of aorta: Secondary | ICD-10-CM | POA: Diagnosis not present

## 2019-06-02 DIAGNOSIS — K8020 Calculus of gallbladder without cholecystitis without obstruction: Secondary | ICD-10-CM | POA: Diagnosis not present

## 2019-06-02 DIAGNOSIS — J410 Simple chronic bronchitis: Secondary | ICD-10-CM | POA: Diagnosis not present

## 2019-06-02 DIAGNOSIS — J439 Emphysema, unspecified: Secondary | ICD-10-CM

## 2019-06-02 DIAGNOSIS — R252 Cramp and spasm: Secondary | ICD-10-CM

## 2019-06-02 DIAGNOSIS — Z720 Tobacco use: Secondary | ICD-10-CM | POA: Diagnosis not present

## 2019-06-02 DIAGNOSIS — N401 Enlarged prostate with lower urinary tract symptoms: Secondary | ICD-10-CM | POA: Diagnosis not present

## 2019-06-02 DIAGNOSIS — E538 Deficiency of other specified B group vitamins: Secondary | ICD-10-CM

## 2019-06-02 DIAGNOSIS — J449 Chronic obstructive pulmonary disease, unspecified: Secondary | ICD-10-CM

## 2019-06-02 DIAGNOSIS — E042 Nontoxic multinodular goiter: Secondary | ICD-10-CM | POA: Diagnosis not present

## 2019-06-02 DIAGNOSIS — E785 Hyperlipidemia, unspecified: Secondary | ICD-10-CM | POA: Diagnosis not present

## 2019-06-02 DIAGNOSIS — M25511 Pain in right shoulder: Secondary | ICD-10-CM | POA: Diagnosis not present

## 2019-06-02 DIAGNOSIS — R350 Frequency of micturition: Secondary | ICD-10-CM

## 2019-06-02 DIAGNOSIS — Z7689 Persons encountering health services in other specified circumstances: Secondary | ICD-10-CM

## 2019-06-02 DIAGNOSIS — G8929 Other chronic pain: Secondary | ICD-10-CM

## 2019-06-02 DIAGNOSIS — R918 Other nonspecific abnormal finding of lung field: Secondary | ICD-10-CM

## 2019-06-02 MED ORDER — CYANOCOBALAMIN 1000 MCG/ML IJ SOLN
1000.0000 ug | INTRAMUSCULAR | Status: DC
  Administered 2019-06-02 – 2019-08-29 (×4): 1000 ug via INTRAMUSCULAR

## 2019-06-02 MED ORDER — BUDESONIDE-FORMOTEROL FUMARATE 80-4.5 MCG/ACT IN AERO: 2.0000 | INHALATION_SPRAY | Freq: Two times a day (BID) | RESPIRATORY_TRACT | 3 refills | Status: DC

## 2019-06-02 NOTE — Progress Notes (Signed)
History of Present Illness: David Hendricks is a 69 y.o. male  with a history of tobacco abuse.In 10/18,He had intermittent chest discomfortwhichhe described as a pressure, that lasted anywhere from 30-45 minutes. He thought it was gas. He had more discomfort a few hours later. It moved to his left arm and left side of his face. It persisted even while he was at rest. He came to the ER via EMS.  InitialPOCtroponin was positive. A lab troponin was also positive. He had some relief of his chest discomfort with nitroglycerin.  Cath showed: 1. Severe single-vessel coronary artery disease with serial 50% ostial and 90% proximal/mid LAD stenoses. 2. Mild, nonobstructive CAD involving the LCx and RCA. 3. Subtle mid and apical anterior hypokinesis with otherwise preserved left ventricular contraction; LVEF low normal at 50-55%. 4. Upper normal left ventricular filling pressure. Successful IVUS guided PCI to the proximal/mid LAD with placement of a Resolute Onyx 3.5 x 22 mm drug-eluting stent, post dilated proximally with a 4.0 mm Byram balloon. The first diagonal branch was jailed by the stent but demonstrates only mild ostial stenosis post PCI with TIMI-3 flow."  "He was admitted with chest pain in Jan 2019. Echo showed :  Left ventricle: The cavity size was normal. Systolic function was normal. The estimated ejection fraction was in the range of 55% to 60%. Wall motion was normal; there were no regional wall motion abnormalities. Doppler parameters are consistent with abnormal left ventricular relaxation (grade 1 diastolic dysfunction). There was no evidence of elevated ventricular filling pressure by Doppler parameters. - Aortic valve: There was mild regurgitation. - Mitral valve: There was no regurgitation. - Right ventricle: The cavity size was moderately dilated. Wall thickness was normal. Systolic function was normal. - Tricuspid valve: There was moderate regurgitation. -  Pulmonary arteries: Systolic pressure was mildly increased. PA peak pressure: 32 mm Hg (S). - Inferior vena cava: The vessel was normal in size. - Pericardium, extracardiac: There was no pericardial effusion. ASSESSMENT AND PLAN:  1.   CAD: He is taking aspirin 81 mg daily. No angina.  COntinue aggressive secondary prevention. 2.   Tobacco abuse: He has had trouble quitting. He will let us know when he is ready to quit and we can try to prescribe something to help. 3.   Hyperlipidemia: LDL above target.   Restart  Atorvastatin 80 mg daily. Lipids and liver tests in 3 months.   4.   Old MI: No CHF sx Following up for thyroid nodules.  David Hendricks 69 y.o.   Chief Complaint  Patient presents with  . Establish Care    Thyroid issues  . COPD    has other concens however i told him they can be addressed with f/u visits    HISTORY OF PRESENT ILLNESS: This is a 69 y.o. male first visit to this office, here to establish care with me.  Has multiple chronic medical problems: 1.  Coronary artery disease status post MIs in the past.  Recent cardiologist visit office notes reviewed as above. 2.  Chronic smoker with no desire to stop. 3.  Multinodular goiter.  Sees endocrinologist on a regular basis. 4.  History of gallstones 5.  Dyslipidemia 6.  Possible COPD but no official diagnosis.  Has not seen a pulmonologist yet.  No medications. 7.  BPH with lower urinary tract symptoms.  On Flomax 0.4 mg daily 8.  Chronic pain to right shoulder.  Sees sports medicine specialist on a regular basis.  CT chest done  on 11/15/2018 showed the following: IMPRESSION: 1. No pulmonary embolism. 2. Mild gallbladder wall thickening and pericholecystic fat stranding in the visualized upper abdomen, compatible with reported acute cholecystitis on abdominal sonogram study from earlier today. 3. Scattered solid pulmonary nodules, largest 7 mm in the lingula. Non-contrast chest CT at 3-6 months is recommended.  If the nodules are stable at time of repeat CT, then future CT at 18-24 months (from today's scan) is considered optional for low-risk patients, but is recommended for high-risk patients. This recommendation follows the consensus statement: Guidelines for Management of Incidental Pulmonary Nodules Detected on CT Images: From the Fleischner Society 2017; Radiology 2017; 284:228-243. 4. Nonspecific circumferential wall thickening in the lower thoracic esophagus. While most commonly due to reflux esophagitis, Barrett's esophagus or esophageal neoplasm cannot be excluded by CT. GI consultation suggested. 5. Prominent goiter, asymmetric to the left. Thyroid ultrasound evaluation suggested on a short term outpatient basis.  Aortic Atherosclerosis (ICD10-I70.0) and Emphysema (ICD10-J43.9).   Electronically Signed   By: Ilona Sorrel M.D.   On: 11/15/2018 12:29   HPI   Prior to Admission medications   Medication Sig Start Date End Date Taking? Authorizing Provider  aspirin EC 81 MG tablet Take 81 mg by mouth daily.   Yes [provider]  atorvastatin (LIPITOR) 80 MG tablet Take 1 tablet (80 mg total) by mouth daily. 05/08/19  Yes [name], MD  methimazole (TAPAZOLE) 5 MG tablet Take 1 tablet (5 mg total) by mouth daily. 03/27/19  Yes Shamleffer, Melanie Crazier, MD  pantoprazole (PROTONIX) 40 MG tablet TAKE 1 TABLET BY MOUTH EVERY DAY 05/21/19  Yes Libby Maw, MD  tamsulosin (FLOMAX) 0.4 MG CAPS capsule Take 0.4 mg by mouth.   Yes [provider]  budesonide-formoterol (SYMBICORT) 80-4.5 MCG/ACT inhaler Inhale 2 puffs into the lungs 2 (two) times daily. 06/02/19   Horald Pollen, MD  Fexofenadine HCl [facility] ALLERGY PO) Take 1 tablet by mouth daily as needed (allergies, sinuses).    [provider]  HYDROcodone-acetaminophen (NORCO/VICODIN) 5-325 MG tablet Take 1 tablet by mouth every 6 (six) hours as needed for severe pain. Patient  not taking: Reported on 06/02/2019 04/23/19   Arlean Hopping C, PA-C  naproxen sodium (ALEVE) 220 MG tablet Take 220 mg by mouth daily as needed (mild pain).    [provider]  ondansetron (ZOFRAN ODT) 4 MG disintegrating tablet Take 1 tablet (4 mg total) by mouth every 8 (eight) hours as needed for nausea or vomiting. Patient not taking: Reported on 06/02/2019 04/23/19   Lorayne Bender, PA-C    No Known Allergies  Patient Active Problem List   Diagnosis Date Noted  . 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  . Multinodular goiter 12/24/2018  . Subclinical hyperthyroidism 12/24/2018  . BPH (benign prostatic hyperplasia) 11/20/2018  . Pulmonary nodules/lesions, multiple 11/15/2018  . Esophageal thickening 11/15/2018  . Cigarette nicotine dependence without complication [id number]  . Old MI (myocardial infarction) 01/30/2017  . CAD (coronary artery disease) 01/30/2017  . Hyperlipidemia 10/10/2016  . History of non-ST elevation myocardial infarction (NSTEMI) 10/09/2016  . Tobacco abuse 08/28/2016  . Difficulty sleeping 04/22/2016  . Benign prostatic hyperplasia with urinary frequency 02/11/2016    Past Medical History:  Diagnosis Date  . Anxiety   . Arthritis   . Coronary artery disease    10/18 PCI/DES to pLAD, mild nonobstructive disease in the Lcx/RCA. Normal EF.   Marland Kitchen Dysrhythmia 2016   irregular heartbeat  . Gallstones   .  Myocardial infarction ([place])   . Pneumonia   . Seasonal allergies     Past Surgical History:  Procedure Laterality Date  . CORONARY ANGIOPLASTY  10/09/2016  . CORONARY STENT INTERVENTION N/A 10/09/2016   Procedure: CORONARY STENT INTERVENTION;  Surgeon: Nelva Bush, MD;  Location: [facility] [facility];  Service: Cardiovascular;  Laterality: N/A;  . INTRAVASCULAR ULTRASOUND/IVUS N/A 10/09/2016   Procedure: Intravascular Ultrasound/IVUS;  Surgeon: Nelva Bush, MD;  Location: [facility];  Service: Cardiovascular;  Laterality: N/A;  . IR  CHOLANGIOGRAM EXISTING TUBE  11/18/2018  . IR PERC CHOLECYSTOSTOMY  11/16/2018  . LEFT HEART CATH AND CORONARY ANGIOGRAPHY N/A 10/09/2016   Procedure: LEFT HEART CATH AND CORONARY ANGIOGRAPHY;  Surgeon: Nelva Bush, MD;  Location: [facility];  Service: Cardiovascular;  Laterality: N/A;    Social History   Socioeconomic History  . Marital status: Divorced    Spouse name: Not on file  . Number of children: 0  . Years of education: Not on file  . Highest education level: Not on file  Occupational History  . Occupation: retired Mudlogger  Tobacco Use  . Smoking status: Current Every Day Smoker    Packs/day: 0.15    Years: 45.00    Pack years: 6.75    Types: Cigarettes    Start date: 01/09/1961  . Smokeless tobacco: Never Used  . Tobacco comment: tobacco info given  Substance and Sexual Activity  . Alcohol use: No  . Drug use: No  . Sexual activity: Not on file  Other Topics Concern  . Not on file  Social History Narrative  . Not on file   Social Determinants of Health   Financial Resource Strain:   . Difficulty of Paying Living Expenses:   Food Insecurity:   . Worried About Charity fundraiser in the Last Year:   . Arboriculturist in the Last Year:   Transportation Needs:   . Film/video editor (Medical):   Marland Kitchen Lack of Transportation (Non-Medical):   Physical Activity:   . Days of Exercise per Week:   . Minutes of Exercise per Session:   Stress:   . Feeling of Stress :   Social Connections:   . Frequency of Communication with Friends and Family:   . Frequency of Social Gatherings with Friends and Family:   . Attends Religious Services:   . Active Member of Clubs or Organizations:   . Attends Archivist Meetings:   Marland Kitchen Marital Status:   Intimate Partner Violence:   . Fear of Current or Ex-Partner:   . Emotionally Abused:   Marland Kitchen Physically Abused:   . Sexually Abused:     Family History  Problem Relation Age of Onset  . Alcohol abuse  Father        called [place] per pt     Review of Systems  Constitutional: Negative.  Negative for chills and fever.  HENT: Negative.  Negative for congestion and sore throat.   Respiratory: Positive for cough (Chronic smoker's cough), shortness of breath (Mostly on exertion) and wheezing.   Cardiovascular: Negative.  Negative for chest pain and palpitations.  Gastrointestinal: Negative for abdominal pain, blood in stool, melena, nausea and vomiting.  Genitourinary: Negative.  Negative for dysuria, frequency and hematuria.  Musculoskeletal:       Chronic leg cramping.  States vitamin B12 shots help.  Skin: Negative.  Negative for rash.  Neurological: Negative.  Negative for dizziness and headaches.  All other systems reviewed and are negative.   Today's Vitals   06/02/19 1059  BP: 120/60  Pulse: 87  Temp: (!) 96.7 F (35.9 C)  TempSrc: Temporal  SpO2: 95%  Weight: 218 lb 8 oz (99.1 kg)  Height: 6\' 1"  (1.854 m)   Body mass index is 28.83 kg/m.  Physical Exam Vitals reviewed.  Constitutional:      Appearance: Normal appearance.  HENT:     Head: Normocephalic.     Mouth/Throat:     Mouth: Mucous membranes are moist.     Pharynx: Oropharynx is clear.  Eyes:     Extraocular Movements: Extraocular movements intact.     Pupils: Pupils are equal, round, and reactive to light.  Neck:     Comments: Nontender goiter Cardiovascular:     Rate and Rhythm: Normal rate and regular rhythm.     Pulses: Normal pulses.     Heart sounds: Normal heart sounds.  Pulmonary:     Effort: Pulmonary effort is normal.     Breath sounds: Normal breath sounds.  Abdominal:     Palpations: Abdomen is soft.     Tenderness: There is no abdominal tenderness.  Musculoskeletal:        General: Normal range of motion.     Cervical back: Normal range of motion and neck supple.     Right lower leg: No edema.     Left lower leg: No edema.  Skin:    General: Skin is warm and dry.      Capillary Refill: Capillary refill takes less than 2 seconds.  Neurological:     General: No focal deficit present.     Mental Status: He is alert and oriented to person, place, and time.  Psychiatric:        Mood and Affect: Mood normal.        Behavior: Behavior normal.    A total of 45 minutes was spent with the patient, greater than 50% of which was in counseling/coordination of care regarding multiple chronic medical problems and their management, cardiovascular risks associated with these conditions, review of all medications, review of most recent specialists office visit notes, review of most recent blood work results, smoking advice, diet and nutrition, COPD diagnosis and medications Symbicort, need for pulmonary evaluation, prognosis and need for follow-up.   ASSESSMENT & PLAN: [place] was seen today for establish care and copd.  Diagnoses and all orders for this visit:  Pulmonary emphysema, unspecified emphysema type ([place]) -     budesonide-formoterol (SYMBICORT) 80-4.5 MCG/ACT inhaler; Inhale 2 puffs into the lungs 2 (two) times daily. -     Ambulatory referral to Pulmonology  Encounter to establish care  Multinodular goiter  Tobacco abuse  Coronary artery disease involving native coronary artery of native heart with other form of angina pectoris (HCC)  Leg cramping  Benign prostatic hyperplasia with urinary frequency  Chronic right shoulder pain  Dyslipidemia  Calculus of gallbladder without cholecystitis without obstruction  Aortic atherosclerosis (HCC)  Smokers' cough (HCC)  B12 deficiency -     cyanocobalamin ((VITAMIN B-12)) injection 1,000 mcg  Pulmonary nodules     Patient Instructions       If you have lab work done today you will be contacted with your lab results within the next 2 weeks.  If you have not heard from [place] then please contact us. The fastest way to get your results is to register for My Chart.   IF you received an x-ray  today,  you will receive an invoice from [facility]. Please contact [facility] at [phone] with questions or concerns regarding your invoice.   IF you received labwork today, you will receive an invoice from [facility]. Please contact [facility] at [phone] with questions or concerns regarding your invoice.   Our billing staff will not be able to assist you with questions regarding bills from these companies.  You will be contacted with the lab results as soon as they are available. The fastest way to get your results is to activate your My Chart account. Instructions are located on the last page of this paperwork. If you have not heard from [place] regarding the results in 2 weeks, please contact this office.      Health Maintenance After Age 31 After age 69, you are at a higher risk for certain long-term diseases and infections as well as injuries from falls. Falls are a major cause of broken bones and head injuries in people who are older than age 63. Getting regular preventive care can help to keep you healthy and well. Preventive care includes getting regular testing and making lifestyle changes as recommended by your health care provider. Talk with your health care provider about:  Which screenings and tests you should have. A screening is a test that checks for a disease when you have no symptoms.  A diet and exercise plan that is right for you. What should I know about screenings and tests to prevent falls? Screening and testing are the best ways to find a health problem early. Early diagnosis and treatment give you the best chance of managing medical conditions that are common after age 49. Certain conditions and lifestyle choices may make you more likely to have a fall. Your health care provider may recommend:  Regular vision checks. Poor vision and conditions such as cataracts can make you more likely to have a fall. If you wear glasses, make sure to get your prescription  updated if your vision changes.  Medicine review. Work with your health care provider to regularly review all of the medicines you are taking, including over-the-counter medicines. Ask your health care provider about any side effects that may make you more likely to have a fall. Tell your health care provider if any medicines that you take make you feel dizzy or sleepy.  Osteoporosis screening. Osteoporosis is a condition that causes the bones to get weaker. This can make the bones weak and cause them to break more easily.  Blood pressure screening. Blood pressure changes and medicines to control blood pressure can make you feel dizzy.  Strength and balance checks. Your health care provider may recommend certain tests to check your strength and balance while standing, walking, or changing positions.  Foot health exam. Foot pain and numbness, as well as not wearing proper footwear, can make you more likely to have a fall.  Depression screening. You may be more likely to have a fall if you have a fear of falling, feel emotionally low, or feel unable to do activities that you used to do.  Alcohol use screening. Using too much alcohol can affect your balance and may make you more likely to have a fall. What actions can I take to lower my risk of falls? General instructions  Talk with your health care provider about your risks for falling. Tell your health care provider if: ? You fall. Be sure to tell your health care provider about all falls, even ones that seem minor. ?  You feel dizzy, sleepy, or off-balance.  Take over-the-counter and prescription medicines only as told by your health care provider. These include any supplements.  Eat a healthy diet and maintain a healthy weight. A healthy diet includes low-fat dairy products, low-fat (lean) meats, and fiber from whole grains, beans, and lots of fruits and vegetables. Home safety  Remove any tripping hazards, such as rugs, cords, and  clutter.  Install safety equipment such as grab bars in bathrooms and safety rails on stairs.  Keep rooms and walkways well-lit. Activity   Follow a regular exercise program to stay fit. This will help you maintain your balance. Ask your health care provider what types of exercise are appropriate for you.  If you need a cane or walker, use it as recommended by your health care provider.  Wear supportive shoes that have nonskid soles. Lifestyle  Do not drink alcohol if your health care provider tells you not to drink.  If you drink alcohol, limit how much you have: ? 0-1 drink a day for women. ? 0-2 drinks a day for men.  Be aware of how much alcohol is in your drink. In the [place] one drink equals one typical bottle of beer (12 oz), one-half glass of wine (5 oz), or one shot of hard liquor (1 oz).  Do not use any products that contain nicotine or tobacco, such as cigarettes and e-cigarettes. If you need help quitting, ask your health care provider. Summary  Having a healthy lifestyle and getting preventive care can help to protect your health and wellness after age 89.  Screening and testing are the best way to find a health problem early and help you avoid having a fall. Early diagnosis and treatment give you the best chance for managing medical conditions that are more common for people who are older than age 8.  Falls are a major cause of broken bones and head injuries in people who are older than age 34. Take precautions to prevent a fall at home.  Work with your health care provider to learn what changes you can make to improve your health and wellness and to prevent falls. This information is not intended to replace advice given to you by your health care provider. Make sure you discuss any questions you have with your health care provider. Document Revised: 04/18/2018 Document Reviewed: 11/08/2016 Elsevier Patient Education  2020 Elsevier Inc.      Agustina Caroli,  MD [facility]

## 2019-06-02 NOTE — Patient Instructions (Addendum)
   If you have lab work done today you will be contacted with your lab results within the next 2 weeks.  If you have not heard from us then please contact us. The fastest way to get your results is to register for My Chart.   IF you received an x-ray today, you will receive an invoice from [facility]. Please contact [facility] at [phone] with questions or concerns regarding your invoice.   IF you received labwork today, you will receive an invoice from [facility]. Please contact [facility] at [phone] with questions or concerns regarding your invoice.   Our billing staff will not be able to assist you with questions regarding bills from these companies.  You will be contacted with the lab results as soon as they are available. The fastest way to get your results is to activate your My Chart account. Instructions are located on the last page of this paperwork. If you have not heard from us regarding the results in 2 weeks, please contact this office.     Health Maintenance After Age 70 After age 65, you are at a higher risk for certain long-term diseases and infections as well as injuries from falls. Falls are a major cause of broken bones and head injuries in people who are older than age 70. Getting regular preventive care can help to keep you healthy and well. Preventive care includes getting regular testing and making lifestyle changes as recommended by your health care provider. Talk with your health care provider about:  Which screenings and tests you should have. A screening is a test that checks for a disease when you have no symptoms.  A diet and exercise plan that is right for you. What should I know about screenings and tests to prevent falls? Screening and testing are the best ways to find a health problem early. Early diagnosis and treatment give you the best chance of managing medical conditions that are common after age 70. Certain conditions and  lifestyle choices may make you more likely to have a fall. Your health care provider may recommend:  Regular vision checks. Poor vision and conditions such as cataracts can make you more likely to have a fall. If you wear glasses, make sure to get your prescription updated if your vision changes.  Medicine review. Work with your health care provider to regularly review all of the medicines you are taking, including over-the-counter medicines. Ask your health care provider about any side effects that may make you more likely to have a fall. Tell your health care provider if any medicines that you take make you feel dizzy or sleepy.  Osteoporosis screening. Osteoporosis is a condition that causes the bones to get weaker. This can make the bones weak and cause them to break more easily.  Blood pressure screening. Blood pressure changes and medicines to control blood pressure can make you feel dizzy.  Strength and balance checks. Your health care provider may recommend certain tests to check your strength and balance while standing, walking, or changing positions.  Foot health exam. Foot pain and numbness, as well as not wearing proper footwear, can make you more likely to have a fall.  Depression screening. You may be more likely to have a fall if you have a fear of falling, feel emotionally low, or feel unable to do activities that you used to do.  Alcohol use screening. Using too much alcohol can affect your balance and may make you more likely to   have a fall. What actions can I take to lower my risk of falls? General instructions  Talk with your health care provider about your risks for falling. Tell your health care provider if: ? You fall. Be sure to tell your health care provider about all falls, even ones that seem minor. ? You feel dizzy, sleepy, or off-balance.  Take over-the-counter and prescription medicines only as told by your health care provider. These include any  supplements.  Eat a healthy diet and maintain a healthy weight. A healthy diet includes low-fat dairy products, low-fat (lean) meats, and fiber from whole grains, beans, and lots of fruits and vegetables. Home safety  Remove any tripping hazards, such as rugs, cords, and clutter.  Install safety equipment such as grab bars in bathrooms and safety rails on stairs.  Keep rooms and walkways well-lit. Activity   Follow a regular exercise program to stay fit. This will help you maintain your balance. Ask your health care provider what types of exercise are appropriate for you.  If you need a cane or walker, use it as recommended by your health care provider.  Wear supportive shoes that have nonskid soles. Lifestyle  Do not drink alcohol if your health care provider tells you not to drink.  If you drink alcohol, limit how much you have: ? 0-1 drink a day for women. ? 0-2 drinks a day for men.  Be aware of how much alcohol is in your drink. In the [place] one drink equals one typical bottle of beer (12 oz), one-half glass of wine (5 oz), or one shot of hard liquor (1 oz).  Do not use any products that contain nicotine or tobacco, such as cigarettes and e-cigarettes. If you need help quitting, ask your health care provider. Summary  Having a healthy lifestyle and getting preventive care can help to protect your health and wellness after age 70.  Screening and testing are the best way to find a health problem early and help you avoid having a fall. Early diagnosis and treatment give you the best chance for managing medical conditions that are more common for people who are older than age 70.  Falls are a major cause of broken bones and head injuries in people who are older than age 70. Take precautions to prevent a fall at home.  Work with your health care provider to learn what changes you can make to improve your health and wellness and to prevent falls. This information is not intended  to replace advice given to you by your health care provider. Make sure you discuss any questions you have with your health care provider. Document Revised: 04/18/2018 Document Reviewed: 11/08/2016 Elsevier Patient Education  2020 Elsevier Inc.  

## 2019-06-04 ENCOUNTER — Ambulatory Visit: Payer: Medicare Other | Admitting: Psychology

## 2019-06-05 ENCOUNTER — Other Ambulatory Visit: Payer: Self-pay

## 2019-06-05 ENCOUNTER — Encounter: Payer: Self-pay | Admitting: Internal Medicine

## 2019-06-05 ENCOUNTER — Ambulatory Visit (INDEPENDENT_AMBULATORY_CARE_PROVIDER_SITE_OTHER): Payer: Medicare Other | Admitting: Internal Medicine

## 2019-06-05 VITALS — BP 110/56 | HR 85 | Temp 98.3°F | Ht 73.0 in | Wt 219.0 lb

## 2019-06-05 DIAGNOSIS — R918 Other nonspecific abnormal finding of lung field: Secondary | ICD-10-CM | POA: Diagnosis not present

## 2019-06-05 DIAGNOSIS — J410 Simple chronic bronchitis: Secondary | ICD-10-CM

## 2019-06-05 DIAGNOSIS — F17210 Nicotine dependence, cigarettes, uncomplicated: Secondary | ICD-10-CM | POA: Diagnosis not present

## 2019-06-05 DIAGNOSIS — J441 Chronic obstructive pulmonary disease with (acute) exacerbation: Secondary | ICD-10-CM

## 2019-06-05 DIAGNOSIS — J432 Centrilobular emphysema: Secondary | ICD-10-CM

## 2019-06-05 MED ORDER — ALBUTEROL SULFATE HFA 108 (90 BASE) MCG/ACT IN AERS: 2.0000 | INHALATION_SPRAY | Freq: Four times a day (QID) | RESPIRATORY_TRACT | 5 refills | Status: DC | PRN

## 2019-06-05 NOTE — Progress Notes (Signed)
David Hendricks    [id number]    07-21-1950  Primary Care Physician:Sagardia, Ines Bloomer, MD Date of Appointment: 06/05/2019 Established Patient Visit  Chief complaint:   Chief Complaint  Patient presents with  . Follow-up    feels like he can't get a full breath.  dry cough.    HPI: David Hendricks is a 69 y.o. gentleman who presents for follow up. Saw me initially in Dec 2020. Left appointment before it was concluded due to upsetting news surrounding thyroid nodule.  Interval Updates: Here for follow up today. Having difficulty breathing.  Often feels like it's hard to take a deep breath in. Coughing and symptoms are worse at night. He does have reflux and is taking PPI for this daily. Taking symbicort twice a day just started two days ago through PCP. No hospitalizations for his breathing.   I have reviewed the patient's family social and past medical history and updated as appropriate.   Past Medical History:  Diagnosis Date  . Anxiety   . Arthritis   . Coronary artery disease    10/18 PCI/DES to pLAD, mild nonobstructive disease in the Lcx/RCA. Normal EF.   Marland Kitchen Dysrhythmia 2016   irregular heartbeat  . Gallstones   . Myocardial infarction ([place])   . Pneumonia   . Seasonal allergies     Past Surgical History:  Procedure Laterality Date  . CORONARY ANGIOPLASTY  10/09/2016  . CORONARY STENT INTERVENTION N/A 10/09/2016   Procedure: CORONARY STENT INTERVENTION;  Surgeon: Nelva Bush, MD;  Location: [facility];  Service: Cardiovascular;  Laterality: N/A;  . INTRAVASCULAR ULTRASOUND/IVUS N/A 10/09/2016   Procedure: Intravascular Ultrasound/IVUS;  Surgeon: Nelva Bush, MD;  Location: [facility];  Service: Cardiovascular;  Laterality: N/A;  . IR CHOLANGIOGRAM EXISTING TUBE  11/18/2018  . IR PERC CHOLECYSTOSTOMY  11/16/2018  . LEFT HEART CATH AND CORONARY ANGIOGRAPHY N/A 10/09/2016   Procedure: LEFT HEART CATH AND CORONARY ANGIOGRAPHY;  Surgeon: Nelva Bush, MD;  Location: [facility];  Service: Cardiovascular;  Laterality: N/A;    Family History  Problem Relation Age of Onset  . Alcohol abuse Father        called [place] per pt    Social History   Occupational History  . Occupation: retired Mudlogger  Tobacco Use  . Smoking status: Current Every Day Smoker    Packs/day: 0.15    Years: 45.00    Pack years: 6.75    Types: Cigarettes    Start date: 01/09/1961  . Smokeless tobacco: Never Used  . Tobacco comment: tobacco info given  Substance and Sexual Activity  . Alcohol use: No  . Drug use: No  . Sexual activity: Not on file    Physical Exam: Blood pressure (!) 110/56, pulse 85, temperature 98.3 F (36.8 C), temperature source Oral, height 6\' 1"  (1.854 m), weight 219 lb (99.3 kg), SpO2 95 %.  Gen:      No acute distress. Anxious.  Lungs:    No increased respiratory effort, symmetric chest wall excursion, clear to auscultation bilaterally, diminished, no wheezes or crackles CV:         Regular rate and rhythm; no murmurs, rubs, or gallops.  No pedal edema   Data Reviewed: Imaging: I have personally reviewed the CT Angio from Nov 2020 which shows severe emphysema and several pulmonary nodules.   PFTs: None on file  Labs: Lab Results  Component Value Date  WBC 12.8 (H) 04/23/2019   HGB 18.4 (H) 04/23/2019   HCT 55.4 (H) 04/23/2019   MCV 84.7 04/23/2019   PLT 179 04/23/2019   Lab Results  Component Value Date   NA 141 04/23/2019   K 3.6 04/23/2019   CL 105 04/23/2019   CO2 26 04/23/2019    Immunization status: Immunization History  Administered Date(s) Administered  . PFIZER SARS-COV-2 Vaccination 02/20/2019, 03/17/2019  . Pneumococcal Conjugate-13 05/10/2017    Assessment:  Multiple Pulmonary Nodules -high risk patient Centrilobular Emphysema Tobacco Use Disorder - ongoing, 90-pack-year smoking history.  Plan/Recommendations: - Plan for CT scan for follow up on  nodules - PFTs - start albuterol. Continue symbicort which was just started. We did inhaler teaching today  I personally spent 5 minutes counseling the patient regarding tobacco use disorder.  Patient is symptomatic from tobacco use disorder due to the following condition: emphysema.  The patient's response was pre-contemplative.  We discussed nicotine replacement therapy, Wellbutrin, Chantix.  We identified to gather patient specific barriers to change.  The patient is open to future discussions about tobacco cessation.  I spent 40 minutes on 06/05/2019 in care of this patient including face to face time and non-face to face time spent charting, review of outside records, and coordination of care.  Return to Care: Return in about 6 months (around 12/06/2019). Will call with the results of the CT scan for further follow up.    David Llamas, MD [facility] [place]

## 2019-06-05 NOTE — Patient Instructions (Addendum)
The patient should have follow up scheduled with myself in 4 months.   Prior to next visit patient should have: Full set of PFTs - anytime before appt. CT scan needs to be done now.   Take symbicort twice a day. Need to gargle after taking symbicort.   Take the albuterol rescue inhaler every 4 to 6 hours as needed for wheezing or shortness of breath. You can also take it 15 minutes before exercise or exertional activity. Side effects include heart racing or pounding, jitters or anxiety. If you have a history of an irregular heart rhythm, it can make this worse. Can also give some patients a hard time sleeping.  To inhale the aerosol using an inhaler, follow these steps:  1. Remove the protective dust cap from the end of the mouthpiece. If the dust cap was not placed on the mouthpiece, check the mouthpiece for dirt or other objects. Be sure that the canister is fully and firmly inserted in the mouthpiece. 2. If you are using the inhaler for the first time or if you have not used the inhaler in more than 14 days, you will need to prime it. You may also need to prime the inhaler if it has been dropped. Ask your pharmacist or check the manufacturer's information if this happens. To prime the inhaler, shake it well and then press down on the canister 4 times to release 4 sprays into the air, away from your face. Be careful not to get albuterol in your eyes. 3. Shake the inhaler well. 4. Breathe out as completely as possible through your mouth. 4. Hold the canister with the mouthpiece on the bottom, facing you and the canister pointing upward. Place the open end of the mouthpiece into your mouth. Close your lips tightly around the mouthpiece. 6. Breathe in slowly and deeply through the mouthpiece.At the same time, press down once on the container to spray the medication into your mouth. 7. Try to hold your breath for 10 seconds. remove the inhaler, and breathe out slowly. 8. If you were told to use 2  puffs, wait 1 minute and then repeat steps 3-7. 9. Replace the protective cap on the inhaler. 10. Clean your inhaler regularly. Follow the manufacturer's directions carefully and ask your doctor or pharmacist if you have any questions about cleaning your inhaler.  Check the back of the inhaler to keep track of the total number of doses left on the inhaler.   Understanding COPD   What is COPD? COPD stands for chronic obstructive pulmonary (lung) disease. COPD is a general term used for several lung diseases.  COPD is an umbrella term and encompasses other  common diseases in this group like chronic bronchitis and emphysema. Chronic asthma may also be included in this group. While some patients with COPD have only chronic bronchitis or emphysema, most patients have a combination of both.  You might hear these terms used in exchange for one another.   COPD adds to the work of the heart. Diseased lungs may reduce the amount of oxygen that goes to the blood. High blood pressure in blood vessels from the heart to the lungs makes it difficult for the heart to pump. Lung disease can also cause the body to produce too many red blood cells which may make the blood thicker and harder to pump.   Patients who have COPD with low oxygen levels may develop an enlarged heart (cor pulmonale). This condition weakens the heart and causes increased shortness  of breath and swelling in the legs and feet.   Chronic bronchitis Chronic bronchitis is irritation and inflammation (swelling) of the lining in the bronchial tubes (air passages). The irritation causes coughing and an excess amount of mucus in the airways. The swelling makes it difficult to get air in and out of the lungs. The small, hair-like structures on the inside of the airways (called cilia) may be damaged by the irritation. The cilia are then unable to help clean mucus from the airways.  Bronchitis is generally considered to be chronic when you have: a  productive cough (cough up mucus) and shortness of breath that lasts about 3 months or more each year for 2 or more years in a row. Your doctor may define chronic bronchitis differently.   Emphysema Emphysema is the destruction, or breakdown, of the walls of the alveoli (air sacs) located at the end of the bronchial tubes. The damaged alveoli are not able to exchange oxygen and carbon dioxide between the lungs and the blood. The bronchioles lose their elasticity and collapse when you exhale, trapping air in the lungs. The trapped air keeps fresh air and oxygen from entering the lungs.   Who is affected by COPD? Emphysema and chronic bronchitis affect approximately 16 million people in the [place], or close to 11 percent of the population.   Symptoms of COPD   Shortness of breath   Shortness of breath with mild exercise (walking, using the stairs, etc.)   Chronic, productive cough (with mucus)   A feeling of "tightness" in the chest   Wheezing   What causes COPD? The two primary causes of COPD are cigarette smoking and alpha1-antitrypsin (AAT) deficiency. Air pollution and occupational dusts may also contribute to COPD, especially when the person exposed to these substances is a cigarette smoker.  Cigarette smoke causes COPD by irritating the airways and creating inflammation that narrows the airways, making it more difficult to breathe. Cigarette smoke also causes the cilia to stop working properly so mucus and trapped particles are not cleaned from the airways. As a result, chronic cough and excess mucus production develop, leading to chronic bronchitis.  In some people, chronic bronchitis and infections can lead to destruction of the small airways, or emphysema.  AAT deficiency, an inherited disorder, can also lead to emphysema. Alpha antitrypsin (AAT) is a protective material produced in the liver and transported to the lungs to help combat inflammation. When there is not enough of  the chemical AAT, the body is no longer protected from an enzyme in the white blood cells.   How is COPD diagnosed?  To diagnose COPD, the physician needs to know: . Do you smoke?  . Have you had chronic exposure to dust or air pollutants?  . Do other members of your family have lung disease?  Marland Kitchen Are you short of breath?  . Do you get short of breath with exercise?  Marland Kitchen Do you have chronic cough and/or wheezing?  Marland Kitchen Do you cough up excess mucus?  To help with the diagnosis, the physician will conduct a thorough physical exam which includes:  1. Listening to your lungs and heart  2. Checking your blood pressure and pulse  3. Examining your nose and throat  4. Checking your feet and ankles for swelling   Laboratory and other tests Several laboratory and other tests are needed to confirm a diagnosis of COPD. These tests may include:  . Chest X-ray to look for lung changes that could  be caused by COPD  .  Spirometry and pulmonary function tests (PFTs) to determine lung volume and air flow  . Pulse oximetry to measure the saturation of oxygen in the blood  . Arterial blood gases (ABGs) to determine the amount of oxygen and carbon dioxide in the blood  . Exercise testing to determine if the oxygen level in the blood drops during exercise   Treatment In the beginning stages of COPD, there is minimal shortness of breath that may be noticed only during exercise. As the disease progresses, shortness of breath may worsen and you may need to wear an oxygen device.   To help control other symptoms of COPD, the following treatments and lifestyle changes may be prescribed.  . Quitting smoking  . Avoiding cigarette smoke and other irritants  . Taking medications including: a. bronchodilators b. anti-inflammatory agents c. oxygen d. antibiotics  . Maintaining a healthy diet  . Following a structured exercise program such as pulmonary rehabilitation . Preventing respiratory infections  . Controlling  stress   If your COPD progresses, you may be eligible to be evaluated for lung volume reduction surgery or lung transplantation. You may also be eligible to participate in certain clinical trials (research studies). Ask your health care providers about studies being conducted in your hospital.   What is the outlook? Although COPD can not be cured, its symptoms can be treated and your quality of life can be improved. Your prognosis or outlook for the future will depend on how well your lungs are functioning, your symptoms, and how well you respond to and follow your treatment plan.

## 2019-06-11 ENCOUNTER — Other Ambulatory Visit: Payer: Medicare Other

## 2019-06-11 ENCOUNTER — Emergency Department (HOSPITAL_COMMUNITY)
Admission: EM | Admit: 2019-06-11 | Discharge: 2019-06-12 | Disposition: A | Discharge status: Home / Self Care | Payer: Medicare Other | Attending: Emergency Medicine | Admitting: Emergency Medicine

## 2019-06-11 ENCOUNTER — Other Ambulatory Visit: Payer: Self-pay

## 2019-06-11 DIAGNOSIS — R1084 Generalized abdominal pain: Secondary | ICD-10-CM | POA: Diagnosis not present

## 2019-06-11 DIAGNOSIS — R1111 Vomiting without nausea: Secondary | ICD-10-CM | POA: Diagnosis not present

## 2019-06-11 DIAGNOSIS — R1030 Lower abdominal pain, unspecified: Secondary | ICD-10-CM | POA: Diagnosis not present

## 2019-06-11 DIAGNOSIS — N23 Unspecified renal colic: Secondary | ICD-10-CM | POA: Diagnosis not present

## 2019-06-11 DIAGNOSIS — F17210 Nicotine dependence, cigarettes, uncomplicated: Secondary | ICD-10-CM | POA: Diagnosis not present

## 2019-06-11 DIAGNOSIS — N132 Hydronephrosis with renal and ureteral calculous obstruction: Secondary | ICD-10-CM | POA: Diagnosis not present

## 2019-06-11 DIAGNOSIS — Z20822 Contact with and (suspected) exposure to covid-19: Secondary | ICD-10-CM | POA: Insufficient documentation

## 2019-06-11 DIAGNOSIS — I2510 Atherosclerotic heart disease of native coronary artery without angina pectoris: Secondary | ICD-10-CM | POA: Diagnosis not present

## 2019-06-11 DIAGNOSIS — Z79899 Other long term (current) drug therapy: Secondary | ICD-10-CM | POA: Insufficient documentation

## 2019-06-11 DIAGNOSIS — I10 Essential (primary) hypertension: Secondary | ICD-10-CM | POA: Diagnosis not present

## 2019-06-11 DIAGNOSIS — Z7982 Long term (current) use of aspirin: Secondary | ICD-10-CM | POA: Insufficient documentation

## 2019-06-11 DIAGNOSIS — Z03818 Encounter for observation for suspected exposure to other biological agents ruled out: Secondary | ICD-10-CM | POA: Diagnosis not present

## 2019-06-11 DIAGNOSIS — R1032 Left lower quadrant pain: Secondary | ICD-10-CM | POA: Diagnosis present

## 2019-06-11 LAB — CBC WITH DIFFERENTIAL/PLATELET
Abs Immature Granulocytes: 0.07 10*3/uL (ref 0.00–0.07)
Basophils Absolute: 0.1 10*3/uL (ref 0.0–0.1)
Basophils Relative: 1 %
Eosinophils Absolute: 0.2 10*3/uL (ref 0.0–0.5)
Eosinophils Relative: 2 %
HCT: 50 % (ref 39.0–52.0)
Hemoglobin: 16.7 g/dL (ref 13.0–17.0)
Immature Granulocytes: 1 %
Lymphocytes Relative: 15 %
Lymphs Abs: 2.2 10*3/uL (ref 0.7–4.0)
MCH: 29.3 pg (ref 26.0–34.0)
MCHC: 33.4 g/dL (ref 30.0–36.0)
MCV: 87.7 fL (ref 80.0–100.0)
Monocytes Absolute: 1 10*3/uL (ref 0.1–1.0)
Monocytes Relative: 7 %
Neutro Abs: 10.6 10*3/uL — ABNORMAL HIGH (ref 1.7–7.7)
Neutrophils Relative %: 74 %
Platelets: 137 10*3/uL — ABNORMAL LOW (ref 150–400)
RBC: 5.7 MIL/uL (ref 4.22–5.81)
RDW: 13.3 % (ref 11.5–15.5)
WBC: 14.2 10*3/uL — ABNORMAL HIGH (ref 4.0–10.5)
nRBC: 0 % (ref 0.0–0.2)

## 2019-06-11 LAB — BASIC METABOLIC PANEL
Anion gap: 9 (ref 5–15)
BUN: 22 mg/dL (ref 8–23)
CO2: 27 mmol/L (ref 22–32)
Calcium: 8.7 mg/dL — ABNORMAL LOW (ref 8.9–10.3)
Chloride: 104 mmol/L (ref 98–111)
Creatinine, Ser: 1.58 mg/dL — ABNORMAL HIGH (ref 0.61–1.24)
GFR calc Af Amer: 51 mL/min — ABNORMAL LOW (ref >60)
GFR calc non Af Amer: 44 mL/min — ABNORMAL LOW (ref >60)
Glucose, Bld: 122 mg/dL — ABNORMAL HIGH (ref 70–99)
Potassium: 4.2 mmol/L (ref 3.5–5.1)
Sodium: 140 mmol/L (ref 135–145)

## 2019-06-11 MED ORDER — MORPHINE SULFATE (PF) 4 MG/ML IV SOLN
4.0000 mg | Freq: Once | INTRAVENOUS | Status: AC
  Administered 2019-06-11: 4 mg via INTRAVENOUS
  Filled 2019-06-11: qty 1

## 2019-06-11 MED ORDER — ONDANSETRON HCL 4 MG/2ML IJ SOLN
4.0000 mg | Freq: Once | INTRAMUSCULAR | Status: AC
  Administered 2019-06-11: 4 mg via INTRAVENOUS
  Filled 2019-06-11: qty 2

## 2019-06-11 NOTE — ED Triage Notes (Signed)
Patient arrived with EMS from home c/o abdominal pain started yesterday morning. Pt reported abdominal pain got worsening and vomitted x2 tonight. Pt a/o x 4.

## 2019-06-11 NOTE — ED Provider Notes (Signed)
David Hendricks   CSN: [id number] Arrival date & time: 06/11/19  2259     History Chief Complaint  Patient presents with  . Abdominal Pain    Pershing Bonnici is a 69 y.o. male.  Patient presents to the emergency department for evaluation of abdominal pain.  Patient reports that pain has been present for 2 days.  He had eased off earlier but then came back.  He has had nausea and vomiting as well as sensation of constipation.  No rectal bleeding or melena.  No fever.        Past Medical History:  Diagnosis Date  . Anxiety   . Arthritis   . Coronary artery disease    10/18 PCI/DES to pLAD, mild nonobstructive disease in the Lcx/RCA. Normal EF.   Marland Kitchen Dysrhythmia 2016   irregular heartbeat  . Gallstones   . Myocardial infarction ([place])   . Pneumonia   . Seasonal allergies     Patient Active Problem List   Diagnosis Date Noted  . Pulmonary emphysema ([place]) 06/02/2019  . Aortic atherosclerosis ([place]) 06/02/2019  . Smokers' cough ([place]) 06/02/2019  . 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  . Multinodular goiter 12/24/2018  . Subclinical hyperthyroidism 12/24/2018  . BPH (benign prostatic hyperplasia) 11/20/2018  . Pulmonary nodules 11/15/2018  . Esophageal thickening 11/15/2018  . Cigarette nicotine dependence without complication 99991111  . Old MI (myocardial infarction) 01/30/2017  . CAD (coronary artery disease) 01/30/2017  . Hyperlipidemia 10/10/2016  . History of non-ST elevation myocardial infarction (NSTEMI) 10/09/2016  . Tobacco abuse 08/28/2016  . Difficulty sleeping 04/22/2016  . Benign prostatic hyperplasia with urinary frequency 02/11/2016    Past Surgical History:  Procedure Laterality Date  . CORONARY ANGIOPLASTY  10/09/2016  . CORONARY STENT INTERVENTION N/A 10/09/2016   Procedure: CORONARY STENT INTERVENTION;  Surgeon: Nelva Bush, MD;  Location: [facility];  Service: Cardiovascular;  Laterality: N/A;   . INTRAVASCULAR ULTRASOUND/IVUS N/A 10/09/2016   Procedure: Intravascular Ultrasound/IVUS;  Surgeon: Nelva Bush, MD;  Location: [facility];  Service: Cardiovascular;  Laterality: N/A;  . IR CHOLANGIOGRAM EXISTING TUBE  11/18/2018  . IR PERC CHOLECYSTOSTOMY  11/16/2018  . LEFT HEART CATH AND CORONARY ANGIOGRAPHY N/A 10/09/2016   Procedure: LEFT HEART CATH AND CORONARY ANGIOGRAPHY;  Surgeon: Nelva Bush, MD;  Location: [facility];  Service: Cardiovascular;  Laterality: N/A;       Family History  Problem Relation Age of Onset  . Alcohol abuse Father        called [place] per pt    Social History   Tobacco Use  . Smoking status: Current Every Day Smoker    Packs/day: 2.00    Years: 45.00    Pack years: 90.00    Types: Cigarettes    Start date: 01/09/1961  . Smokeless tobacco: Never Used  . Tobacco comment: tobacco info given  Substance Use Topics  . Alcohol use: No  . Drug use: No    Home Medications Prior to Admission medications   Medication Sig Start Date End Date Taking? Authorizing Provider  albuterol (VENTOLIN HFA) 108 (90 Base) MCG/ACT inhaler Inhale 2 puffs into the lungs every 6 (six) hours as needed. Patient taking differently: Inhale 2 puffs into the lungs every 6 (six) hours as needed for wheezing or shortness of breath.  06/05/19  Yes Spero Geralds, MD  aspirin EC 81 MG tablet Take 81 mg by mouth daily.   Yes [provider]  atorvastatin (LIPITOR) 80 MG tablet Take 1 tablet (80 mg total) by mouth daily. 05/08/19  Yes [name], MD  budesonide-formoterol [facility]) 80-4.5 MCG/ACT inhaler Inhale 2 puffs into the lungs 2 (two) times daily. 06/02/19  Yes Sagardia, Ines Bloomer, MD  Fexofenadine HCl [facility] ALLERGY PO) Take 1 tablet by mouth daily as needed (allergies, sinuses).   Yes [provider]  MELATONIN PO Take 1 tablet by mouth at bedtime as needed (sleep).   Yes [provider]  methimazole  (TAPAZOLE) 5 MG tablet Take 1 tablet (5 mg total) by mouth daily. 03/27/19  Yes Shamleffer, Melanie Crazier, MD  naproxen sodium (ALEVE) 220 MG tablet Take 220 mg by mouth daily as needed (mild pain).   Yes [provider]  ondansetron (ZOFRAN) 4 MG tablet Take 4 mg by mouth every 8 (eight) hours as needed for nausea or vomiting.   Yes [provider]  pantoprazole (PROTONIX) 40 MG tablet TAKE 1 TABLET BY MOUTH EVERY DAY Patient taking differently: Take 40 mg by mouth daily.  05/21/19  Yes Libby Maw, MD  tamsulosin (FLOMAX) 0.4 MG CAPS capsule Take 0.4 mg by mouth daily.    Yes [provider]    Allergies    Patient has no known allergies.  Review of Systems   Review of Systems  Gastrointestinal: Positive for abdominal pain, constipation, nausea and vomiting.  All other systems reviewed and are negative.   Physical Exam Updated Vital Signs BP 120/69   Pulse 73   Temp 97.8 F (36.6 C) (Oral)   Resp 18   Ht 6\' 1"  (1.854 m)   Wt 99.8 kg   SpO2 (!) 88% Comment: Patient placed on 2L O2  BMI 29.03 kg/m   Physical Exam Vitals and nursing Hendricks reviewed.  Constitutional:      General: He is not in acute distress.    Appearance: Normal appearance. He is well-developed.  HENT:     Head: Normocephalic and atraumatic.     Right Ear: Hearing normal.     Left Ear: Hearing normal.     Nose: Nose normal.  Eyes:     Conjunctiva/sclera: Conjunctivae normal.     Pupils: Pupils are equal, round, and reactive to light.  Cardiovascular:     Rate and Rhythm: Regular rhythm.     Heart sounds: S1 normal and S2 normal. No murmur. No friction rub. No gallop.   Pulmonary:     Effort: Pulmonary effort is normal. No respiratory distress.     Breath sounds: Normal breath sounds.  Chest:     Chest wall: No tenderness.  Abdominal:     General: Bowel sounds are normal.     Palpations: Abdomen is soft.     Tenderness: There is abdominal tenderness in the left  lower quadrant. There is no guarding or rebound. Negative signs include Murphy's sign and McBurney's sign.     Hernia: No hernia is present.  Musculoskeletal:        General: Normal range of motion.     Cervical back: Normal range of motion and neck supple.  Skin:    General: Skin is warm and dry.     Findings: No rash.  Neurological:     Mental Status: He is alert and oriented to person, place, and time.     GCS: GCS eye subscore is 4. GCS verbal subscore is 5. GCS motor subscore is 6.     Cranial Nerves: No cranial  nerve deficit.     Sensory: No sensory deficit.     Coordination: Coordination normal.  Psychiatric:        Speech: Speech normal.        Behavior: Behavior normal.        Thought Content: Thought content normal.     ED Results / Procedures / Treatments   Labs (all labs ordered are listed, but only abnormal results are displayed) Labs Reviewed  CBC WITH DIFFERENTIAL/PLATELET - Abnormal; Notable for the following components:      Result Value   WBC 14.2 (*)    Platelets 137 (*)    Neutro Abs 10.6 (*)    All other components within normal limits  BASIC METABOLIC PANEL - Abnormal; Notable for the following components:   Glucose, Bld 122 (*)    Creatinine, Ser 1.58 (*)    Calcium 8.7 (*)    GFR calc non Af Amer 44 (*)    GFR calc Af Amer 51 (*)    All other components within normal limits  URINALYSIS, ROUTINE W REFLEX MICROSCOPIC    EKG None  Radiology CT ABDOMEN PELVIS W CONTRAST  Result Date: 06/12/2019 CLINICAL DATA:  Left lower quadrant abdominal pain EXAM: CT ABDOMEN AND PELVIS WITH CONTRAST TECHNIQUE: Multidetector CT imaging of the abdomen and pelvis was performed using the standard protocol following bolus administration of intravenous contrast. CONTRAST:  143mL OMNIPAQUE IOHEXOL 300 MG/ML  SOLN COMPARISON:  April 23, 2019 FINDINGS: Lower chest: The visualized heart size within normal limits. No pericardial fluid/thickening. No hiatal hernia. The  visualized portions of the lungs are clear. Hepatobiliary: The liver is normal in density without focal abnormality.The main portal vein is patent. The patient is status post cholecystectomy. Pancreas: Unremarkable. No pancreatic ductal dilatation or surrounding inflammatory changes. Spleen: Normal in size without focal abnormality. Adrenals/Urinary Tract: Both adrenal glands appear normal. There is interval progression in the mild left pelvicaliectasis and ureterectasis down to the level of the distal ureter where there is a 7 mm calculus which appears to be more distal than on the prior exam. There is mild perinephric and periureteral stranding to the level of the UVJ. Again noted is a punctate calcification lower pole of the right kidney. The bladder is unremarkable. Stomach/Bowel: The stomach, small bowel, and colon are normal in appearance. No inflammatory changes, wall thickening, or obstructive findings. A moderate to large amount of right colonic stool is present. The appendix is unremarkable. Vascular/Lymphatic: There are no enlarged mesenteric, retroperitoneal, or pelvic lymph nodes. Scattered aortic and iliac atherosclerotic calcifications are seen without aneurysmal dilatation. Reproductive: The prostate is unremarkable. Other: No evidence of abdominal wall mass or hernia. Musculoskeletal: No acute or significant osseous findings. IMPRESSION: Since the prior exam there is interval progression in the 7 mm distal left ureteral calculus causing mild left hydronephrosis with perinephric /periureteral stranding which could be due to inflammatory or concomitant infectious etiology. Aortic Atherosclerosis (ICD10-I70.0). Electronically Signed   By: Prudencio Pair M.D.   On: 06/12/2019 00:38    Procedures Procedures (including critical care time)  Medications Ordered in ED Medications  sodium chloride (PF) 0.9 % injection (has no administration in time range)  morphine 4 MG/ML injection 4 mg (4 mg  Intravenous Given 06/11/19 2332)  ondansetron (ZOFRAN) injection 4 mg (4 mg Intravenous Given 06/11/19 2332)  iohexol (OMNIPAQUE) 300 MG/ML solution 100 mL (100 mLs Intravenous Contrast Given 06/12/19 0018)  sodium chloride 0.9 % bolus 500 mL (500 mLs Intravenous New Bag/Given 06/12/19  [email or web address])    ED Course  I have reviewed the triage vital signs and the nursing notes.  Pertinent labs & imaging results that were available during my care of the patient were reviewed by me and considered in my medical decision making (see chart for details).    MDM Rules/Calculators/A&P                      Patient presents to the emergency department for evaluation of left-sided abdominal pain.  Patient reports that pain started yesterday morning, then eased off.  He then had increased pain tonight.  He reports that in the past he has had pain on the left side secondary to a kidney stone, but at that time it was much lower.  He did have associated nausea and vomiting tonight.  Abdominal exam did reveal tenderness in the left lower quadrant, no guarding or peritonitis.  CT scan was performed.  No diverticulitis or other infectious process noted.  He does, however, have a stone in the left distal ureter.  It is unclear if this is the same stone that was seen in April.  Patient treated with analgesia, will need to follow-up with urology.  Final Clinical Impression(s) / ED Diagnoses Final diagnoses:  Ureteral colic    Rx / DC Orders ED Discharge Orders    None       Cyrah Mclamb, Gwenyth Allegra, MD 06/12/19 [phone]

## 2019-06-12 ENCOUNTER — Other Ambulatory Visit: Payer: Self-pay

## 2019-06-12 ENCOUNTER — Encounter (HOSPITAL_COMMUNITY): Payer: Self-pay

## 2019-06-12 ENCOUNTER — Emergency Department (HOSPITAL_COMMUNITY): Payer: Medicare Other

## 2019-06-12 ENCOUNTER — Emergency Department (HOSPITAL_COMMUNITY)
Admission: EM | Admit: 2019-06-12 | Discharge: 2019-06-12 | Disposition: A | Discharge status: Home / Self Care | Payer: Medicare Other | Source: Home / Self Care | Attending: Emergency Medicine | Admitting: Emergency Medicine

## 2019-06-12 DIAGNOSIS — N23 Unspecified renal colic: Secondary | ICD-10-CM | POA: Diagnosis not present

## 2019-06-12 DIAGNOSIS — R9431 Abnormal electrocardiogram [ECG] [EKG]: Secondary | ICD-10-CM | POA: Diagnosis not present

## 2019-06-12 DIAGNOSIS — Z03818 Encounter for observation for suspected exposure to other biological agents ruled out: Secondary | ICD-10-CM | POA: Diagnosis not present

## 2019-06-12 DIAGNOSIS — N132 Hydronephrosis with renal and ureteral calculous obstruction: Secondary | ICD-10-CM | POA: Diagnosis not present

## 2019-06-12 LAB — URINALYSIS, ROUTINE W REFLEX MICROSCOPIC
Bacteria, UA: NONE SEEN
Bilirubin Urine: NEGATIVE
Glucose, UA: NEGATIVE mg/dL
Ketones, ur: NEGATIVE mg/dL
Leukocytes,Ua: NEGATIVE
Nitrite: NEGATIVE
Protein, ur: NEGATIVE mg/dL
Specific Gravity, Urine: 1.034 — ABNORMAL HIGH (ref 1.005–1.030)
pH: 5 (ref 5.0–8.0)

## 2019-06-12 LAB — SARS CORONAVIRUS 2 BY RT PCR (HOSPITAL ORDER, PERFORMED IN ~~LOC~~ HOSPITAL LAB): SARS Coronavirus 2: NEGATIVE

## 2019-06-12 MED ORDER — IOHEXOL 300 MG/ML  SOLN
100.0000 mL | Freq: Once | INTRAMUSCULAR | Status: AC | PRN
  Administered 2019-06-12: 100 mL via INTRAVENOUS

## 2019-06-12 MED ORDER — SODIUM CHLORIDE 0.9 % IV BOLUS
500.0000 mL | Freq: Once | INTRAVENOUS | Status: AC
  Administered 2019-06-12: 500 mL via INTRAVENOUS

## 2019-06-12 MED ORDER — OXYCODONE-ACETAMINOPHEN 5-325 MG PO TABS: 1.0000 | ORAL_TABLET | ORAL | 0 refills | Status: DC | PRN

## 2019-06-12 MED ORDER — SODIUM CHLORIDE (PF) 0.9 % IJ SOLN
INTRAMUSCULAR | Status: AC
  Filled 2019-06-12: qty 50

## 2019-06-12 MED ORDER — ONDANSETRON HCL 4 MG PO TABS
4.0000 mg | ORAL_TABLET | Freq: Four times a day (QID) | ORAL | 0 refills | Status: DC | PRN
Start: 2019-06-12 — End: 2020-03-09

## 2019-06-12 MED ORDER — HYDROMORPHONE HCL 1 MG/ML IJ SOLN
1.0000 mg | Freq: Once | INTRAMUSCULAR | Status: AC
  Administered 2019-06-12: 1 mg via INTRAVENOUS
  Filled 2019-06-12: qty 1

## 2019-06-12 MED ORDER — ONDANSETRON HCL 4 MG/2ML IJ SOLN
4.0000 mg | Freq: Once | INTRAMUSCULAR | Status: AC
  Administered 2019-06-12: 4 mg via INTRAVENOUS
  Filled 2019-06-12: qty 2

## 2019-06-12 NOTE — ED Triage Notes (Signed)
Pt c/o abdominal pain and emesis x 2 days.  Pain score 4/10.  Pt was just discharged.  Reports he was waiting for the bus, began feeling dizzy, and vomited.

## 2019-06-12 NOTE — ED Provider Notes (Signed)
[facility] DEPT Provider Note   CSN: [id number] Arrival date & time: 06/12/19  0502     History Chief Complaint  Patient presents with  . Abdominal Pain  . Emesis    David Hendricks is a 69 y.o. male.  Patient returns with recurrent abdominal pain, nausea and vomiting.  Patient was seen earlier tonight with similar and found to have 7 mm left ureteral stone.        Past Medical History:  Diagnosis Date  . Anxiety   . Arthritis   . Coronary artery disease    10/18 PCI/DES to pLAD, mild nonobstructive disease in the Lcx/RCA. Normal EF.   Marland Kitchen Dysrhythmia 2016   irregular heartbeat  . Gallstones   . Myocardial infarction ([place])   . Pneumonia   . Seasonal allergies     Patient Active Problem List   Diagnosis Date Noted  . Pulmonary emphysema ([place]) 06/02/2019  . Aortic atherosclerosis ([place]) 06/02/2019  . Smokers' cough ([place]) 06/02/2019  . 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  . Multinodular goiter 12/24/2018  . Subclinical hyperthyroidism 12/24/2018  . BPH (benign prostatic hyperplasia) 11/20/2018  . Pulmonary nodules 11/15/2018  . Esophageal thickening 11/15/2018  . Cigarette nicotine dependence without complication 99991111  . Old MI (myocardial infarction) 01/30/2017  . CAD (coronary artery disease) 01/30/2017  . Hyperlipidemia 10/10/2016  . History of non-ST elevation myocardial infarction (NSTEMI) 10/09/2016  . Tobacco abuse 08/28/2016  . Difficulty sleeping 04/22/2016  . Benign prostatic hyperplasia with urinary frequency 02/11/2016    Past Surgical History:  Procedure Laterality Date  . CORONARY ANGIOPLASTY  10/09/2016  . CORONARY STENT INTERVENTION N/A 10/09/2016   Procedure: CORONARY STENT INTERVENTION;  Surgeon: Nelva Bush, MD;  Location: [facility];  Service: Cardiovascular;  Laterality: N/A;  . INTRAVASCULAR ULTRASOUND/IVUS N/A 10/09/2016   Procedure: Intravascular Ultrasound/IVUS;  Surgeon: Nelva Bush,  MD;  Location: [facility];  Service: Cardiovascular;  Laterality: N/A;  . IR CHOLANGIOGRAM EXISTING TUBE  11/18/2018  . IR PERC CHOLECYSTOSTOMY  11/16/2018  . LEFT HEART CATH AND CORONARY ANGIOGRAPHY N/A 10/09/2016   Procedure: LEFT HEART CATH AND CORONARY ANGIOGRAPHY;  Surgeon: Nelva Bush, MD;  Location: [facility];  Service: Cardiovascular;  Laterality: N/A;       Family History  Problem Relation Age of Onset  . Alcohol abuse Father        called [place] per pt    Social History   Tobacco Use  . Smoking status: Current Every Day Smoker    Packs/day: 2.00    Years: 45.00    Pack years: 90.00    Types: Cigarettes    Start date: 01/09/1961  . Smokeless tobacco: Never Used  . Tobacco comment: tobacco info given  Substance Use Topics  . Alcohol use: No  . Drug use: No    Home Medications Prior to Admission medications   Medication Sig Start Date End Date Taking? Authorizing Provider  albuterol (VENTOLIN HFA) 108 (90 Base) MCG/ACT inhaler Inhale 2 puffs into the lungs every 6 (six) hours as needed. Patient taking differently: Inhale 2 puffs into the lungs every 6 (six) hours as needed for wheezing or shortness of breath.  06/05/19   Spero Geralds, MD  aspirin EC 81 MG tablet Take 81 mg by mouth daily.    [provider]  atorvastatin (LIPITOR) 80 MG tablet Take 1 tablet (80 mg total) by mouth daily. 05/08/19   [name], MD  budesonide-formoterol (SYMBICORT) 80-4.5 MCG/ACT inhaler Inhale 2 puffs into the lungs 2 (two) times daily. 06/02/19   Horald Pollen, MD  Fexofenadine HCl [facility] ALLERGY PO) Take 1 tablet by mouth daily as needed (allergies, sinuses).    [provider]  MELATONIN PO Take 1 tablet by mouth at bedtime as needed (sleep).    [provider]  methimazole (TAPAZOLE) 5 MG tablet Take 1 tablet (5 mg total) by mouth daily. 03/27/19   Shamleffer, Melanie Crazier, MD  naproxen sodium (ALEVE) 220 MG  tablet Take 220 mg by mouth daily as needed (mild pain).    [provider]  ondansetron (ZOFRAN) 4 MG tablet Take 4 mg by mouth every 8 (eight) hours as needed for nausea or vomiting.    [provider]  oxyCODONE-acetaminophen (PERCOCET) 5-325 MG tablet Take 1 tablet by mouth every 4 (four) hours as needed. 06/12/19   Orpah Greek, MD  pantoprazole (PROTONIX) 40 MG tablet TAKE 1 TABLET BY MOUTH EVERY DAY Patient taking differently: Take 40 mg by mouth daily.  05/21/19   Libby Maw, MD  tamsulosin (FLOMAX) 0.4 MG CAPS capsule Take 0.4 mg by mouth daily.     [provider]    Allergies    Patient has no known allergies.  Review of Systems   Review of Systems  Gastrointestinal: Positive for abdominal pain, nausea and vomiting.  Genitourinary: Positive for flank pain.  All other systems reviewed and are negative.   Physical Exam Updated Vital Signs BP (!) 143/75 (BP Location: Right Arm)   Pulse 74   Temp (!) 97.5 F (36.4 C) (Oral)   Resp 18   Ht 6\' 1"  (1.854 m)   Wt 99.8 kg   SpO2 94%   BMI 29.03 kg/m   Physical Exam Vitals and nursing note reviewed.  Constitutional:      General: He is not in acute distress.    Appearance: Normal appearance. He is well-developed.  HENT:     Head: Normocephalic and atraumatic.     Right Ear: Hearing normal.     Left Ear: Hearing normal.     Nose: Nose normal.  Eyes:     Conjunctiva/sclera: Conjunctivae normal.     Pupils: Pupils are equal, round, and reactive to light.  Cardiovascular:     Rate and Rhythm: Regular rhythm.     Heart sounds: S1 normal and S2 normal. No murmur. No friction rub. No gallop.   Pulmonary:     Effort: Pulmonary effort is normal. No respiratory distress.     Breath sounds: Normal breath sounds.  Chest:     Chest wall: No tenderness.  Abdominal:     General: Bowel sounds are normal.     Palpations: Abdomen is soft.     Tenderness: There is no abdominal  tenderness. There is no guarding or rebound. Negative signs include Murphy's sign and McBurney's sign.     Hernia: No hernia is present.  Musculoskeletal:        General: Normal range of motion.     Cervical back: Normal range of motion and neck supple.  Skin:    General: Skin is warm and dry.     Findings: No rash.  Neurological:     Mental Status: He is alert and oriented to person, place, and time.     GCS: GCS eye subscore is 4. GCS verbal subscore is 5. GCS motor subscore is 6.     Cranial Nerves: No cranial  nerve deficit.     Sensory: No sensory deficit.     Coordination: Coordination normal.  Psychiatric:        Speech: Speech normal.        Behavior: Behavior normal.        Thought Content: Thought content normal.     ED Results / Procedures / Treatments   Labs (all labs ordered are listed, but only abnormal results are displayed) Labs Reviewed  SARS CORONAVIRUS 2 BY RT PCR (HOSPITAL ORDER, [facility])    EKG None  Radiology CT ABDOMEN PELVIS W CONTRAST  Result Date: 06/12/2019 CLINICAL DATA:  Left lower quadrant abdominal pain EXAM: CT ABDOMEN AND PELVIS WITH CONTRAST TECHNIQUE: Multidetector CT imaging of the abdomen and pelvis was performed using the standard protocol following bolus administration of intravenous contrast. CONTRAST:  154mL OMNIPAQUE IOHEXOL 300 MG/ML  SOLN COMPARISON:  April 23, 2019 FINDINGS: Lower chest: The visualized heart size within normal limits. No pericardial fluid/thickening. No hiatal hernia. The visualized portions of the lungs are clear. Hepatobiliary: The liver is normal in density without focal abnormality.The main portal vein is patent. The patient is status post cholecystectomy. Pancreas: Unremarkable. No pancreatic ductal dilatation or surrounding inflammatory changes. Spleen: Normal in size without focal abnormality. Adrenals/Urinary Tract: Both adrenal glands appear normal. There is interval progression in the  mild left pelvicaliectasis and ureterectasis down to the level of the distal ureter where there is a 7 mm calculus which appears to be more distal than on the prior exam. There is mild perinephric and periureteral stranding to the level of the UVJ. Again noted is a punctate calcification lower pole of the right kidney. The bladder is unremarkable. Stomach/Bowel: The stomach, small bowel, and colon are normal in appearance. No inflammatory changes, wall thickening, or obstructive findings. A moderate to large amount of right colonic stool is present. The appendix is unremarkable. Vascular/Lymphatic: There are no enlarged mesenteric, retroperitoneal, or pelvic lymph nodes. Scattered aortic and iliac atherosclerotic calcifications are seen without aneurysmal dilatation. Reproductive: The prostate is unremarkable. Other: No evidence of abdominal wall mass or hernia. Musculoskeletal: No acute or significant osseous findings. IMPRESSION: Since the prior exam there is interval progression in the 7 mm distal left ureteral calculus causing mild left hydronephrosis with perinephric /periureteral stranding which could be due to inflammatory or concomitant infectious etiology. Aortic Atherosclerosis (ICD10-I70.0). Electronically Signed   By: Prudencio Pair M.D.   On: 06/12/2019 00:38    Procedures Procedures (including critical care time)  Medications Ordered in ED Medications  HYDROmorphone (DILAUDID) injection 1 mg (has no administration in time range)  ondansetron (ZOFRAN) injection 4 mg (has no administration in time range)    ED Course  I have reviewed the triage vital signs and the nursing notes.  Pertinent labs & imaging results that were available during my care of the patient were reviewed by me and considered in my medical decision making (see chart for details).    MDM Rules/Calculators/A&P                      Patient returns with nausea, vomiting and flank pain.  Symptoms are secondary to his  known kidney stone.  Discussed with Dr. Alinda Money, on-call for urology.  Recommends attempting to get patient more comfortable and he will have office call patient at home to schedule follow-up appointment to determine if he needs intervention.  Return to this ER if he has further problems.  Final  Clinical Impression(s) / ED Diagnoses Final diagnoses:  Renal colic on left side    Rx / DC Orders ED Discharge Orders    None       Coley Kulikowski, Gwenyth Allegra, MD 06/12/19 [phone]

## 2019-06-12 NOTE — Discharge Instructions (Signed)
The urologists will call you with an appointment for follow-up.  Return to this ER if your symptoms worsen.

## 2019-06-13 ENCOUNTER — Encounter (HOSPITAL_BASED_OUTPATIENT_CLINIC_OR_DEPARTMENT_OTHER): Payer: Self-pay | Admitting: Urology

## 2019-06-13 ENCOUNTER — Other Ambulatory Visit: Payer: Self-pay | Admitting: Urology

## 2019-06-13 DIAGNOSIS — N201 Calculus of ureter: Secondary | ICD-10-CM | POA: Diagnosis not present

## 2019-06-13 NOTE — Progress Notes (Signed)
Per Dr. Royce Macadamia David Hendricks needs to have surgery performed at [facility], I left a voicemail for [facility] at Dr. Keane Scrape office to make her aware.

## 2019-06-13 NOTE — Progress Notes (Signed)
Anesthesia Chart Review   Case: [id number] Date/Time: 06/17/19 1145   Procedures:      CYSTOSCOPY WITH RETROGRADE PYELOGRAM, URETEROSCOPY AND STENT PLACEMENT (Left ) - 1 HR     HOLMIUM LASER APPLICATION (Left )   Anesthesia type: General   Pre-op diagnosis: LEFT URETERAL CALCULUS   Location: [facility] OR ROOM 2 / [place]   Surgeons: Robley Fries, MD      DISCUSSION:69 y.o. current every day smoker (90 pack years) with h/o COPD, multiple pulmonary nodules, CAD (PCI/DES 10/2016), left ureteral calculus scheduled for above procedure 06/17/2019 with Dr. Jacalyn Lefevre.   Pt last seen by pulmonology 06/05/2019.  Per OV note, "Here for follow up today.  Having difficulty breathing.  Often feels like it's hard to take a deep breath in.  Coughing and symptoms are worse at night.  He does have reflux and is taking PPI for this daily. Taking symbicort twice a day just started two days ago through PCP. No hospitalizations for his breathing."  CT ordered to follow up on nodules, PFTS ordered, albuterol prescribed in addition to Symbicort.    Discussed with Dr. Royce Macadamia.  Case should be moved to main.  Will evaluate DOS.  VS: There were no vitals taken for this visit.  PROVIDERS: Horald Pollen, MD is PCP   Lenice Llamas, MD is Pulmonologist   Larae Grooms, MD is Cardiologist  LABS: labs DOS, SDW (all labs ordered are listed, but only abnormal results are displayed)  Labs Reviewed - No data to display   IMAGES:   EKG: 06/12/2019 Rate 66 bpm  Sinus rhythm  Nonspecific intraventricular conduction delay   CV: Echo 01/24/2017 Study Conclusions   - Left ventricle: The cavity size was normal. Systolic function was  normal. The estimated ejection fraction was in the range of 55%  to 60%. Wall motion was normal; there were no regional wall  motion abnormalities. Doppler parameters are consistent with  abnormal left ventricular relaxation (grade 1 diastolic   dysfunction). There was no evidence of elevated ventricular  filling pressure by Doppler parameters.  - Aortic valve: There was mild regurgitation.  - Mitral valve: There was no regurgitation.  - Right ventricle: The cavity size was moderately dilated. Wall  thickness was normal. Systolic function was normal.  - Tricuspid valve: There was moderate regurgitation.  - Pulmonary arteries: Systolic pressure was mildly increased. PA  peak pressure: 32 mm Hg (S).  - Inferior vena cava: The vessel was normal in size.  - Pericardium, extracardiac: There was no pericardial effusion.  Past Medical History:  Diagnosis Date  . Anxiety   . Aortic atherosclerosis ([place])   . Arthritis   . Benign prostatic hyperplasia (BPH) with urinary urgency   . COPD (chronic obstructive pulmonary disease) ([place])    possible   . Coronary artery disease    10/18 PCI/DES to pLAD, mild nonobstructive disease in the Lcx/RCA. Normal EF.   Marland Kitchen Depression   . Dyslipidemia   . Dysrhythmia 2016   irregular heartbeat  . Gallstones   . History of kidney stones   . Hyperthyroidism   . Leg cramps   . Multiple pulmonary nodules   . Myocardial infarction ([place])   . Pneumonia   . Seasonal allergies   . Thyroid nodule   . Vertigo   . Vitamin B 12 deficiency     Past Surgical History:  Procedure Laterality Date  . CORONARY ANGIOPLASTY  10/09/2016  . CORONARY STENT INTERVENTION N/A  10/09/2016   Procedure: CORONARY STENT INTERVENTION;  Surgeon: Nelva Bush, MD;  Location: [facility] LAB;  Service: Cardiovascular;  Laterality: N/A;  . INTRAVASCULAR ULTRASOUND/IVUS N/A 10/09/2016   Procedure: Intravascular Ultrasound/IVUS;  Surgeon: Nelva Bush, MD;  Location: [facility];  Service: Cardiovascular;  Laterality: N/A;  . IR CHOLANGIOGRAM EXISTING TUBE  11/18/2018  . IR PERC CHOLECYSTOSTOMY  11/16/2018  . LEFT HEART CATH AND CORONARY ANGIOGRAPHY N/A 10/09/2016   Procedure: LEFT HEART CATH AND CORONARY  ANGIOGRAPHY;  Surgeon: Nelva Bush, MD;  Location: [facility];  Service: Cardiovascular;  Laterality: N/A;    MEDICATIONS: . cyanocobalamin ((VITAMIN B-12)) injection 1,000 mcg   . albuterol (VENTOLIN HFA) 108 (90 Base) MCG/ACT inhaler  . aspirin EC 81 MG tablet  . atorvastatin (LIPITOR) 80 MG tablet  . budesonide-formoterol (SYMBICORT) 80-4.5 MCG/ACT inhaler  . Fexofenadine HCl (MUCINEX ALLERGY PO)  . MELATONIN PO  . methimazole (TAPAZOLE) 5 MG tablet  . naproxen sodium (ALEVE) 220 MG tablet  . ondansetron (ZOFRAN) 4 MG tablet  . ondansetron (ZOFRAN) 4 MG tablet  . oxyCODONE-acetaminophen (PERCOCET) 5-325 MG tablet  . pantoprazole (PROTONIX) 40 MG tablet  . tamsulosin (FLOMAX) 0.4 MG CAPS capsule     Maia Plan [facility] Pre-Surgical Testing [phone] 06/13/19  2:43 PM

## 2019-06-16 ENCOUNTER — Other Ambulatory Visit: Payer: Self-pay

## 2019-06-16 ENCOUNTER — Encounter (HOSPITAL_COMMUNITY): Payer: Self-pay | Admitting: Urology

## 2019-06-16 NOTE — H&P (Signed)
/HPI: cc: ureteral calculus   06/13/19: 69 year old man with left flank pain found to have a 7 mm distal left ureteral calculus on imaging. Stone was initially seen on imaging in April and at that point in the mid ureter. Repeat imaging on 06/12/2019 showed interval progression of the stone to the distal ureter. Patient denies fever and chills but has had some nausea. Nausea responds well to Zofran. He is not currently hurting. Urinalysis without signs of infection. This is patient's 1st kidney stone. He is unsure if he has ever seen a urologist before.     ALLERGIES: No Known Drug Allergies    MEDICATIONS: Aspirin 81 mg tablet,chewable  Tamsulosin Hcl 0.4 mg capsule  Albuterol Sulfate  Atorvastatin Calcium 80 mg tablet  Budesonide-Formoterol Fumarate  Melatonin  Methimazole 5 mg tablet  Mucinex  Naproxen Sodium  Ondansetron Odt 4 mg tablet,disintegrating  Oxycodone-Acetaminophen 5 mg-325 mg tablet  Pantoprazole Sodium 40 mg tablet, delayed release     GU PSH: None   NON-GU PSH: None   GU PMH: None   NON-GU PMH: Arthritis GERD    FAMILY HISTORY: None   SOCIAL HISTORY: Marital Status: Divorced Preferred Language: English; Ethnicity: Not Hispanic Or Latino; Race: White Current Smoking Status: Patient smokes.   Tobacco Use Assessment Completed: Used Tobacco in last 30 days? Does not drink anymore.  Drinks 4+ caffeinated drinks per day.    REVIEW OF SYSTEMS:    GU Review Male:   Patient denies frequent urination, hard to postpone urination, burning/ pain with urination, get up at night to urinate, leakage of urine, stream starts and stops, trouble starting your stream, have to strain to urinate , erection problems, and penile pain.  Gastrointestinal (Upper):   Patient denies nausea, vomiting, and indigestion/ heartburn.  Gastrointestinal (Lower):   Patient denies diarrhea and constipation.  Constitutional:   Patient denies weight loss, night sweats, fatigue, and fever.   Skin:   Patient denies skin rash/ lesion and itching.  Eyes:   Patient denies blurred vision and double vision.  Ears/ Nose/ Throat:   Patient denies sore throat and sinus problems.  Hematologic/Lymphatic:   Patient denies swollen glands and easy bruising.  Cardiovascular:   Patient denies leg swelling and chest pains.  Respiratory:   Patient denies cough and shortness of breath.  Endocrine:   Patient denies excessive thirst.  Musculoskeletal:   Patient denies back pain and joint pain.  Neurological:   Patient reports dizziness. Patient denies headaches.  Psychologic:   Patient denies depression and anxiety.   VITAL SIGNS:      06/13/2019 09:21 AM  Weight 220 lb / 99.79 kg  Height 73 in / 185.42 cm  BP 98/62 mmHg  Pulse 89 /min  Temperature 97.1 F / 36.1 C  BMI 29.0 kg/m   MULTI-SYSTEM PHYSICAL EXAMINATION:    Constitutional: Well-nourished. No physical deformities. Normally developed. Good grooming.  Neck: Neck symmetrical, not swollen. Normal tracheal position.  Respiratory: No labored breathing, no use of accessory muscles.   Cardiovascular: Normal temperature  Skin: No paleness, no jaundice, no cyanosis. No lesion, no ulcer, no rash.  Neurologic / Psychiatric: Oriented to time, oriented to place, oriented to person. No depression, no anxiety, no agitation.  Gastrointestinal: No mass, no tenderness, no rigidity, non obese abdomen. No CVA tenderness bilaterally  Eyes: Normal conjunctivae. Normal eyelids.  Ears, Nose, Mouth, and Throat: Left ear no scars, no lesions, no masses. Right ear no scars, no lesions, no masses. Nose no scars, no  lesions, no masses. Normal hearing. Normal lips.  Musculoskeletal: Normal gait and station of head and neck.     Complexity of Data:  Records Review:   POC Tool  Urine Test Review:   Urinalysis  X-Ray Review: C.T. Abdomen/Pelvis: Reviewed Films. Reviewed Report. Discussed With Patient.    Notes:                     04/23/2019: BUN 14,  creatinine 1.08     PROCEDURES: None   ASSESSMENT:      ICD-10 Details  1 GU:   Ureteral calculus - W43.1 Acute, Uncomplicated - Patient is currently pain controlled and we discussed options for stone management given the length of time he said the stone. I do not think he would be an ideal candidate for ESWL based on his COPD and ability to lie flat. I will schedule him for cystoscopy, ureteroscopy with laser lithotripsy and stent placement next week. I gave him a refill of Zofran to help with the nausea through the weekend. Discussed the risks and benefits of the above procedure including bleeding, pain, damage to surrounding structures, need for staged procedure, stent discomfort, infection. Patient understands and wishes to proceed.  2   Renal calculus - N20.0 Chronic, Stable   PLAN:            Medications New Meds: Zofran 4 mg tablet 1 tablet PO Q 8 H PRN nausea  #15  0 Refill(s)

## 2019-06-17 ENCOUNTER — Ambulatory Visit (HOSPITAL_COMMUNITY): Payer: Medicare Other | Admitting: Physician Assistant

## 2019-06-17 ENCOUNTER — Other Ambulatory Visit: Payer: Self-pay | Admitting: Family Medicine

## 2019-06-17 ENCOUNTER — Ambulatory Visit (HOSPITAL_COMMUNITY): Payer: Medicare Other

## 2019-06-17 ENCOUNTER — Encounter (HOSPITAL_COMMUNITY): Payer: Self-pay | Admitting: Urology

## 2019-06-17 ENCOUNTER — Ambulatory Visit (HOSPITAL_COMMUNITY)
Admission: RE | Admit: 2019-06-17 | Discharge: 2019-06-17 | Disposition: A | Discharge status: Home / Self Care | Payer: Medicare Other | Attending: Urology | Admitting: Urology

## 2019-06-17 ENCOUNTER — Encounter (HOSPITAL_COMMUNITY)
Admission: RE | Disposition: A | Discharge status: Home / Self Care | Payer: Self-pay | Source: Home / Self Care | Attending: Urology

## 2019-06-17 ENCOUNTER — Telehealth (HOSPITAL_COMMUNITY): Payer: Self-pay | Admitting: *Deleted

## 2019-06-17 DIAGNOSIS — F17210 Nicotine dependence, cigarettes, uncomplicated: Secondary | ICD-10-CM | POA: Diagnosis not present

## 2019-06-17 DIAGNOSIS — Z79899 Other long term (current) drug therapy: Secondary | ICD-10-CM | POA: Diagnosis not present

## 2019-06-17 DIAGNOSIS — K219 Gastro-esophageal reflux disease without esophagitis: Secondary | ICD-10-CM | POA: Insufficient documentation

## 2019-06-17 DIAGNOSIS — Z79891 Long term (current) use of opiate analgesic: Secondary | ICD-10-CM | POA: Insufficient documentation

## 2019-06-17 DIAGNOSIS — I2510 Atherosclerotic heart disease of native coronary artery without angina pectoris: Secondary | ICD-10-CM | POA: Insufficient documentation

## 2019-06-17 DIAGNOSIS — Z7951 Long term (current) use of inhaled steroids: Secondary | ICD-10-CM | POA: Insufficient documentation

## 2019-06-17 DIAGNOSIS — Z7982 Long term (current) use of aspirin: Secondary | ICD-10-CM | POA: Diagnosis not present

## 2019-06-17 DIAGNOSIS — J449 Chronic obstructive pulmonary disease, unspecified: Secondary | ICD-10-CM | POA: Diagnosis not present

## 2019-06-17 DIAGNOSIS — Z20822 Contact with and (suspected) exposure to covid-19: Secondary | ICD-10-CM | POA: Insufficient documentation

## 2019-06-17 DIAGNOSIS — N201 Calculus of ureter: Secondary | ICD-10-CM | POA: Insufficient documentation

## 2019-06-17 DIAGNOSIS — M1990 Unspecified osteoarthritis, unspecified site: Secondary | ICD-10-CM | POA: Diagnosis not present

## 2019-06-17 DIAGNOSIS — E785 Hyperlipidemia, unspecified: Secondary | ICD-10-CM | POA: Insufficient documentation

## 2019-06-17 DIAGNOSIS — N400 Enlarged prostate without lower urinary tract symptoms: Secondary | ICD-10-CM | POA: Insufficient documentation

## 2019-06-17 DIAGNOSIS — I252 Old myocardial infarction: Secondary | ICD-10-CM | POA: Diagnosis not present

## 2019-06-17 DIAGNOSIS — Z955 Presence of coronary angioplasty implant and graft: Secondary | ICD-10-CM | POA: Diagnosis not present

## 2019-06-17 HISTORY — DX: Cramp and spasm: R25.2

## 2019-06-17 HISTORY — DX: Deficiency of other specified B group vitamins: E53.8

## 2019-06-17 HISTORY — DX: Personal history of urinary calculi: Z87.442

## 2019-06-17 HISTORY — DX: Thyrotoxicosis, unspecified without thyrotoxic crisis or storm: E05.90

## 2019-06-17 HISTORY — DX: Atherosclerosis of aorta: I70.0

## 2019-06-17 HISTORY — PX: HOLMIUM LASER APPLICATION: SHX5852

## 2019-06-17 HISTORY — DX: Dizziness and giddiness: R42

## 2019-06-17 HISTORY — DX: Benign prostatic hyperplasia with lower urinary tract symptoms: N40.1

## 2019-06-17 HISTORY — DX: Other nonspecific abnormal finding of lung field: R91.8

## 2019-06-17 HISTORY — DX: Nontoxic single thyroid nodule: E04.1

## 2019-06-17 HISTORY — DX: Depression, unspecified: F32.A

## 2019-06-17 HISTORY — DX: Chronic obstructive pulmonary disease, unspecified: J44.9

## 2019-06-17 HISTORY — PX: CYSTOSCOPY WITH RETROGRADE PYELOGRAM, URETEROSCOPY AND STENT PLACEMENT: SHX5789

## 2019-06-17 HISTORY — DX: Hyperlipidemia, unspecified: E78.5

## 2019-06-17 LAB — SARS CORONAVIRUS 2 BY RT PCR (HOSPITAL ORDER, PERFORMED IN ~~LOC~~ HOSPITAL LAB): SARS Coronavirus 2: NEGATIVE

## 2019-06-17 SURGERY — CYSTOURETEROSCOPY, WITH RETROGRADE PYELOGRAM AND STENT INSERTION
Anesthesia: General | Laterality: Left

## 2019-06-17 MED ORDER — ACETAMINOPHEN 160 MG/5ML PO SOLN: 325.0000 mg | ORAL | Status: DC | PRN

## 2019-06-17 MED ORDER — SODIUM CHLORIDE 0.9 % IR SOLN
Status: DC | PRN
  Administered 2019-06-17: 3000 mL

## 2019-06-17 MED ORDER — PHENAZOPYRIDINE HCL 100 MG PO TABS
100.0000 mg | ORAL_TABLET | Freq: Three times a day (TID) | ORAL | 0 refills | Status: DC | PRN
Start: 2019-06-17 — End: 2020-02-15

## 2019-06-17 MED ORDER — OXYCODONE HCL 5 MG/5ML PO SOLN: 5.0000 mg | Freq: Once | ORAL | Status: DC | PRN

## 2019-06-17 MED ORDER — LACTATED RINGERS IV SOLN: INTRAVENOUS | Status: DC

## 2019-06-17 MED ORDER — PROPOFOL 10 MG/ML IV BOLUS
INTRAVENOUS | Status: DC | PRN
  Administered 2019-06-17: 200 mg via INTRAVENOUS

## 2019-06-17 MED ORDER — FENTANYL CITRATE (PF) 100 MCG/2ML IJ SOLN
INTRAMUSCULAR | Status: AC
  Filled 2019-06-17: qty 2

## 2019-06-17 MED ORDER — MEPERIDINE HCL 50 MG/ML IJ SOLN: 6.2500 mg | INTRAMUSCULAR | Status: DC | PRN

## 2019-06-17 MED ORDER — CHLORHEXIDINE GLUCONATE 0.12 % MT SOLN
15.0000 mL | Freq: Once | OROMUCOSAL | Status: AC
  Administered 2019-06-17: 15 mL via OROMUCOSAL

## 2019-06-17 MED ORDER — FENTANYL CITRATE (PF) 100 MCG/2ML IJ SOLN: 25.0000 ug | INTRAMUSCULAR | Status: DC | PRN

## 2019-06-17 MED ORDER — FENTANYL CITRATE (PF) 100 MCG/2ML IJ SOLN
25.0000 ug | INTRAMUSCULAR | Status: DC | PRN
  Administered 2019-06-17 (×2): 50 ug via INTRAVENOUS

## 2019-06-17 MED ORDER — SODIUM CHLORIDE 0.9 % IV SOLN
2.0000 g | INTRAVENOUS | Status: AC
  Administered 2019-06-17: 2 g via INTRAVENOUS
  Filled 2019-06-17: qty 20

## 2019-06-17 MED ORDER — CIPROFLOXACIN HCL 500 MG PO TABS
500.0000 mg | ORAL_TABLET | Freq: Once | ORAL | 0 refills | Status: AC
Start: 2019-06-17 — End: 2019-06-17

## 2019-06-17 MED ORDER — LIDOCAINE HCL (CARDIAC) PF 100 MG/5ML IV SOSY
PREFILLED_SYRINGE | INTRAVENOUS | Status: DC | PRN
  Administered 2019-06-17: 50 mg via INTRAVENOUS

## 2019-06-17 MED ORDER — ONDANSETRON HCL 4 MG/2ML IJ SOLN: 4.0000 mg | Freq: Once | INTRAMUSCULAR | Status: DC | PRN

## 2019-06-17 MED ORDER — OXYCODONE HCL 5 MG PO TABS: 5.0000 mg | ORAL_TABLET | Freq: Once | ORAL | Status: DC | PRN

## 2019-06-17 MED ORDER — ONDANSETRON HCL 4 MG/2ML IJ SOLN
INTRAMUSCULAR | Status: DC | PRN
  Administered 2019-06-17: 4 mg via INTRAVENOUS

## 2019-06-17 MED ORDER — ORAL CARE MOUTH RINSE: 15.0000 mL | Freq: Once | OROMUCOSAL | Status: AC

## 2019-06-17 MED ORDER — EPHEDRINE SULFATE 50 MG/ML IJ SOLN
INTRAMUSCULAR | Status: DC | PRN
Start: 2019-06-17 — End: 2019-06-17
  Administered 2019-06-17: 5 mg via INTRAVENOUS

## 2019-06-17 MED ORDER — ACETAMINOPHEN 325 MG PO TABS: 325.0000 mg | ORAL_TABLET | ORAL | Status: DC | PRN

## 2019-06-17 MED ORDER — FENTANYL CITRATE (PF) 100 MCG/2ML IJ SOLN
INTRAMUSCULAR | Status: DC | PRN
  Administered 2019-06-17 (×2): 50 ug via INTRAVENOUS

## 2019-06-17 MED ORDER — DEXAMETHASONE SODIUM PHOSPHATE 4 MG/ML IJ SOLN
INTRAMUSCULAR | Status: DC | PRN
  Administered 2019-06-17: 10 mg via INTRAVENOUS

## 2019-06-17 SURGICAL SUPPLY — 20 items
BAG URO CATCHER STRL LF (MISCELLANEOUS) ×2 IMPLANT
BASKET ZERO TIP NITINOL 2.4FR (BASKET) ×2 IMPLANT
CATH URET 5FR 28IN OPEN ENDED (CATHETERS) ×2 IMPLANT
CLOTH BEACON ORANGE TIMEOUT ST (SAFETY) ×2 IMPLANT
DRSG TEGADERM 2-3/8X2-3/4 SM (GAUZE/BANDAGES/DRESSINGS) ×2 IMPLANT
EXTRACTOR STONE 1.7FRX115CM (UROLOGICAL SUPPLIES) IMPLANT
FIBER LASER FLEXIVA 365 (UROLOGICAL SUPPLIES) IMPLANT
FIBER LASER TRAC TIP (UROLOGICAL SUPPLIES) ×2 IMPLANT
GAUZE 4X4 16PLY RFD (DISPOSABLE) ×2 IMPLANT
GLOVE BIO SURGEON STRL SZ 6.5 (GLOVE) ×2 IMPLANT
GOWN STRL REUS W/TWL LRG LVL3 (GOWN DISPOSABLE) ×2 IMPLANT
GUIDEWIRE STR DUAL SENSOR (WIRE) ×2 IMPLANT
KIT TURNOVER KIT A (KITS) IMPLANT
MANIFOLD NEPTUNE II (INSTRUMENTS) ×2 IMPLANT
SHEATH URETERAL 12FRX28CM (UROLOGICAL SUPPLIES) IMPLANT
SHEATH URETERAL 12FRX35CM (MISCELLANEOUS) IMPLANT
STENT URET 6FRX26 CONTOUR (STENTS) ×2 IMPLANT
TRAY CYSTO PACK (CUSTOM PROCEDURE TRAY) ×2 IMPLANT
TUBING CONNECTING 10 (TUBING) ×2 IMPLANT
TUBING UROLOGY SET (TUBING) ×2 IMPLANT

## 2019-06-17 NOTE — Discharge Instructions (Signed)
DISCHARGE INSTRUCTIONS FOR KIDNEY STONE/URETERAL STENT   MEDICATIONS:  1. Resume all your other meds from home. 2. Pyridium is to help with the burning/stinging when you urinate. 3. You may take flomax daily to help with stent discomfort.   4. Take Cipro one hour prior to removal of your stent.    ACTIVITY:  1. No strenuous activity x 1week  2. No driving while on narcotic pain medications  3. Drink plenty of water  4. Continue to walk at home - you can still get blood clots when you are at home, so keep active, but don't over do it.  5. May return to work/school tomorrow or when you feel ready   BATHING:  1. You can shower and we recommend daily showers  2. You have a string coming from your urethra: The stent string is attached to your ureteral stent. Do not pull on this.   SIGNS/SYMPTOMS TO CALL:  Please call us if you have a fever greater than 101.5, uncontrolled nausea/vomiting, uncontrolled pain, dizziness, unable to urinate, bloody urine, chest pain, shortness of breath, leg swelling, leg pain, redness around wound, drainage from wound, or any other concerns or questions.   You can reach [place] at [phone].   FOLLOW-UP:  1. You have a string attached to your stent, you may remove it on. To do this, pull the strings until the stent is completely removed. You may feel an odd sensation in your back.  Please take the antibiotic 1 hour prior to removing stent.

## 2019-06-17 NOTE — Transfer of Care (Signed)
Immediate Anesthesia Transfer of Care Note  Patient: David Hendricks  Procedure(s) Performed: CYSTOSCOPY WITH RETROGRADE PYELOGRAM, URETEROSCOPY AND STENT PLACEMENT (Left ) HOLMIUM LASER APPLICATION (Left )  Patient Location: PACU  Anesthesia Type:General  Level of Consciousness: awake, alert  and oriented  Airway & Oxygen Therapy: Patient Spontanous Breathing and Patient connected to face mask oxygen  Post-op Assessment: Report given to RN  Post vital signs: Reviewed and stable  Last Vitals:  Vitals Value Taken Time  BP 109/54 06/17/19 1100  Temp    Pulse 68 06/17/19 1102  Resp 18 06/17/19 1102  SpO2 99 % 06/17/19 1102  Vitals shown include unvalidated device data.  Last Pain:  Vitals:   06/17/19 0758  TempSrc: Oral  PainSc:       Patients Stated Pain Goal: 1 (10/12/47 6116)  Complications: No apparent anesthesia complications

## 2019-06-17 NOTE — Interval H&P Note (Signed)
History and Physical Interval Note:  06/17/2019 8:43 AM  David Hendricks  has presented today for surgery, with the diagnosis of LEFT URETERAL CALCULUS.  The various methods of treatment have been discussed with the patient and family. After consideration of risks, benefits and other options for treatment, the patient has consented to  Procedure(s) with comments: CYSTOSCOPY WITH RETROGRADE PYELOGRAM, URETEROSCOPY AND STENT PLACEMENT (Left) - 1 HR HOLMIUM LASER APPLICATION (Left) as a surgical intervention.  The patient's history has been reviewed, patient examined, no change in status, stable for surgery.  I have reviewed the patient's chart and labs.  Questions were answered to the patient's satisfaction.     Media Pizzini D Aaiden Depoy

## 2019-06-17 NOTE — Anesthesia Postprocedure Evaluation (Signed)
Anesthesia Post Note  Patient: David Hendricks  Procedure(s) Performed: CYSTOSCOPY WITH RETROGRADE PYELOGRAM, URETEROSCOPY AND STENT PLACEMENT (Left ) HOLMIUM LASER APPLICATION (Left )     Patient location during evaluation: PACU Anesthesia Type: General Level of consciousness: awake and alert Pain management: pain level controlled Vital Signs Assessment: post-procedure vital signs reviewed and stable Respiratory status: spontaneous breathing, nonlabored ventilation, respiratory function stable and patient connected to nasal cannula oxygen Cardiovascular status: blood pressure returned to baseline and stable Postop Assessment: no apparent nausea or vomiting Anesthetic complications: no    Last Vitals:  Vitals:   06/17/19 1136 06/17/19 1145  BP:  118/63  Pulse: 67 70  Resp: 12 11  Temp:    SpO2: 90% 92%    Last Pain:  Vitals:   06/17/19 1145  TempSrc:   PainSc: Asleep                 Jencarlo Bonadonna

## 2019-06-17 NOTE — Op Note (Signed)
Preoperative diagnosis: left ureteral calculus  Postoperative diagnosis: left ureteral calculus  Procedure:  1. Cystoscopy 2. left ureteroscopy, laser lithotripsy and stone removal 3. left 39F x 239Fr ureteral stent placement   Surgeon: Jacalyn Lefevre, MD  Anesthesia: General  Complications: None  Intraoperative findings: 1. Normal urethra 2. Normal bladder mucosa with orthotopic UOs 3. No intravesical median lobe, mild lateral lobe hypertrophy  EBL: Minimal  Specimens: 1. left ureteral calculus  Disposition of specimens: [facility] for stone analysis  Indication: David Hendricks is a 69 y.o.   patient with a 72mm left ureteral stone and associated left symptoms. After reviewing the management options for treatment, the patient elected to proceed with the above surgical procedure(s). We have discussed the potential benefits and risks of the procedure, side effects of the proposed treatment, the likelihood of the patient achieving the goals of the procedure, and any potential problems that might occur during the procedure or recuperation. Informed consent has been obtained.   Description of procedure:  The patient was taken to the operating room and general anesthesia was induced.  The patient was placed in the dorsal lithotomy position, prepped and draped in the usual sterile fashion, and preoperative antibiotics were administered. A preoperative time-out was performed.   Cystourethroscopy was performed.  The patient's urethra was examined and was normal.  The bladder was then systematically examined in its entirety. There was no evidence for any bladder tumors, stones, or other mucosal pathology.    Attention then turned to the left ureteral orifice a 0.38 sensor guidewire was then advanced up the left ureter into the renal pelvis under fluoroscopic guidance. The 4.5 Fr semirigid ureteroscope was then advanced into the ureter next to the guidewire and the calculus was  identified.   The stone was then fragmented with the 200 micron holmium laser fiber.   All stones were then removed from the ureter with an 0 tip basket.  Reinspection of the ureter revealed no remaining visible stones or fragments.   The wire was then backloaded through the cystoscope and a ureteral stent was advance over the wire using Seldinger technique.  The stent was positioned appropriately under fluoroscopic and cystoscopic guidance.  The wire was then removed with an adequate stent curl noted in the renal pelvis as well as in the bladder.  The bladder was then emptied and the procedure ended.  The patient appeared to tolerate the procedure well and without complications.  The patient was able to be awakened and transferred to the recovery unit in satisfactory condition.   Disposition: The tether of the stent was left on and secured to the ventral aspect of the patient's penis.  The patient will be instructed to remove his stent at home on Monday, June 14.Marland Kitchen

## 2019-06-17 NOTE — Anesthesia Procedure Notes (Signed)
Procedure Name: LMA Insertion Date/Time: 06/17/2019 10:07 AM Performed by: Silas Sacramento, CRNA Pre-anesthesia Checklist: Patient identified, Emergency Drugs available, Suction available and Patient being monitored Patient Re-evaluated:Patient Re-evaluated prior to induction Oxygen Delivery Method: Circle system utilized Preoxygenation: Pre-oxygenation with 100% oxygen Induction Type: IV induction LMA: LMA with gastric port inserted LMA Size: 4.0 Tube type: Oral Number of attempts: 1 Placement Confirmation: ETT inserted through vocal cords under direct vision,  positive ETCO2 and breath sounds checked- equal and bilateral Tube secured with: Tape Dental Injury: Teeth and Oropharynx as per pre-operative assessment

## 2019-06-17 NOTE — Anesthesia Preprocedure Evaluation (Addendum)
Anesthesia Evaluation  Patient identified by MRN, date of birth, ID band Patient awake    Reviewed: Allergy & Precautions, H&P , NPO status , Patient's Chart, lab work & pertinent test results, reviewed documented beta blocker date and time   Airway Mallampati: II  TM Distance: >3 FB Neck ROM: full    Dental no notable dental hx. (+) Edentulous Upper, Edentulous Lower   Pulmonary COPD, Current Smoker,    Pulmonary exam normal breath sounds clear to auscultation       Cardiovascular Exercise Tolerance: Good + CAD, + Past MI and + Cardiac Stents  + dysrhythmias  Rhythm:regular Rate:Normal  10/18 PCI/DES to pLAD, mild nonobstructive disease in the Lcx/RCA. Normal EF.     EKG: 06/12/2019 Rate 66 bpm  Sinus rhythm  Nonspecific intraventricular conduction delay   CV: Echo 01/24/2017 Study Conclusions   - Left ventricle: The cavity size was normal. Systolic function was  normal. The estimated ejection fraction was in the range of 55%  to 60%. Wall motion was normal; there were no regional wall  motion abnormalities. Doppler parameters are consistent with  abnormal left ventricular relaxation (grade 1 diastolic  dysfunction). There was no evidence of elevated ventricular  filling pressure by Doppler parameters.  - Aortic valve: There was mild regurgitation.  - Mitral valve: There was no regurgitation.  - Right ventricle: The cavity size was moderately dilated. Wall  thickness was normal. Systolic function was normal.  - Tricuspid valve: There was moderate regurgitation.  - Pulmonary arteries: Systolic pressure was mildly increased. PA  peak pressure: 32 mm Hg (S).    Neuro/Psych PSYCHIATRIC DISORDERS Anxiety Depression negative neurological ROS     GI/Hepatic negative GI ROS, Neg liver ROS,   Endo/Other  Hyperthyroidism   Renal/GU negative Renal ROS  negative genitourinary   Musculoskeletal  (+)  Arthritis , Osteoarthritis,    Abdominal   Peds  Hematology negative hematology ROS (+)   Anesthesia Other Findings   Reproductive/Obstetrics negative OB ROS                           Anesthesia Physical Anesthesia Plan  ASA: III  Anesthesia Plan: General   Post-op Pain Management:    Induction: Intravenous  PONV Risk Score and Plan: 2 and Ondansetron, Dexamethasone and Treatment may vary due to age or medical condition  Airway Management Planned: LMA  Additional Equipment:   Intra-op Plan:   Post-operative Plan:   Informed Consent: I have reviewed the patients History and Physical, chart, labs and discussed the procedure including the risks, benefits and alternatives for the proposed anesthesia with the patient or authorized representative who has indicated his/her understanding and acceptance.     Dental Advisory Given  Plan Discussed with: CRNA and Anesthesiologist  Anesthesia Plan Comments: ( )        Anesthesia Quick Evaluation

## 2019-06-18 ENCOUNTER — Encounter: Payer: Self-pay | Admitting: *Deleted

## 2019-06-18 DIAGNOSIS — N201 Calculus of ureter: Secondary | ICD-10-CM | POA: Diagnosis not present

## 2019-06-18 DIAGNOSIS — R310 Gross hematuria: Secondary | ICD-10-CM | POA: Diagnosis not present

## 2019-06-30 ENCOUNTER — Other Ambulatory Visit: Payer: Self-pay

## 2019-06-30 ENCOUNTER — Ambulatory Visit (INDEPENDENT_AMBULATORY_CARE_PROVIDER_SITE_OTHER): Payer: Medicare Other | Admitting: Emergency Medicine

## 2019-06-30 DIAGNOSIS — E538 Deficiency of other specified B group vitamins: Secondary | ICD-10-CM

## 2019-06-30 NOTE — Patient Instructions (Signed)
° ° ° °  If you have lab work done today you will be contacted with your lab results within the next 2 weeks.  If you have not heard from us then please contact us. The fastest way to get your results is to register for My Chart. ° ° °IF you received an x-ray today, you will receive an invoice from [facility]. Please contact [facility] at [phone] with questions or concerns regarding your invoice.  ° °IF you received labwork today, you will receive an invoice from [facility]. Please contact [facility] at [phone] with questions or concerns regarding your invoice.  ° °Our billing staff will not be able to assist you with questions regarding bills from these companies. ° °You will be contacted with the lab results as soon as they are available. The fastest way to get your results is to activate your My Chart account. Instructions are located on the last page of this paperwork. If you have not heard from us regarding the results in 2 weeks, please contact this office. °  ° ° ° °

## 2019-07-25 ENCOUNTER — Ambulatory Visit: Payer: Medicare Other | Admitting: Internal Medicine

## 2019-07-30 ENCOUNTER — Other Ambulatory Visit: Payer: Self-pay

## 2019-07-30 ENCOUNTER — Ambulatory Visit (INDEPENDENT_AMBULATORY_CARE_PROVIDER_SITE_OTHER): Payer: Medicare Other | Admitting: Emergency Medicine

## 2019-07-30 DIAGNOSIS — E538 Deficiency of other specified B group vitamins: Secondary | ICD-10-CM

## 2019-07-30 NOTE — Progress Notes (Signed)
Pt came and got B12 shot. Tolerated well

## 2019-08-19 DIAGNOSIS — E785 Hyperlipidemia, unspecified: Secondary | ICD-10-CM | POA: Diagnosis not present

## 2019-08-19 DIAGNOSIS — K228 Other specified diseases of esophagus: Secondary | ICD-10-CM | POA: Diagnosis not present

## 2019-08-19 DIAGNOSIS — E0520 Thyrotoxicosis with toxic multinodular goiter without thyrotoxic crisis or storm: Secondary | ICD-10-CM | POA: Diagnosis not present

## 2019-08-19 DIAGNOSIS — N400 Enlarged prostate without lower urinary tract symptoms: Secondary | ICD-10-CM | POA: Diagnosis not present

## 2019-08-19 DIAGNOSIS — I700 Atherosclerosis of aorta: Secondary | ICD-10-CM | POA: Diagnosis not present

## 2019-08-19 DIAGNOSIS — F17210 Nicotine dependence, cigarettes, uncomplicated: Secondary | ICD-10-CM | POA: Diagnosis not present

## 2019-08-19 DIAGNOSIS — J439 Emphysema, unspecified: Secondary | ICD-10-CM | POA: Diagnosis not present

## 2019-08-19 DIAGNOSIS — N201 Calculus of ureter: Secondary | ICD-10-CM | POA: Diagnosis not present

## 2019-08-19 DIAGNOSIS — G4700 Insomnia, unspecified: Secondary | ICD-10-CM | POA: Diagnosis not present

## 2019-08-28 DIAGNOSIS — M7541 Impingement syndrome of right shoulder: Secondary | ICD-10-CM | POA: Diagnosis not present

## 2019-08-29 ENCOUNTER — Other Ambulatory Visit: Payer: Self-pay

## 2019-08-29 ENCOUNTER — Ambulatory Visit (INDEPENDENT_AMBULATORY_CARE_PROVIDER_SITE_OTHER): Payer: Medicare Other | Admitting: Family Medicine

## 2019-08-29 DIAGNOSIS — E538 Deficiency of other specified B group vitamins: Secondary | ICD-10-CM

## 2019-08-29 NOTE — Patient Instructions (Signed)
° ° ° °  If you have lab work done today you will be contacted with your lab results within the next 2 weeks.  If you have not heard from us then please contact us. The fastest way to get your results is to register for My Chart. ° ° °IF you received an x-ray today, you will receive an invoice from [facility]. Please contact [facility] at [phone] with questions or concerns regarding your invoice.  ° °IF you received labwork today, you will receive an invoice from [facility]. Please contact [facility] at [phone] with questions or concerns regarding your invoice.  ° °Our billing staff will not be able to assist you with questions regarding bills from these companies. ° °You will be contacted with the lab results as soon as they are available. The fastest way to get your results is to activate your My Chart account. Instructions are located on the last page of this paperwork. If you have not heard from us regarding the results in 2 weeks, please contact this office. °  ° ° ° °

## 2019-09-09 ENCOUNTER — Encounter: Payer: Self-pay | Admitting: Gastroenterology

## 2019-09-10 ENCOUNTER — Other Ambulatory Visit: Payer: Self-pay | Admitting: Orthopedic Surgery

## 2019-09-10 DIAGNOSIS — M25511 Pain in right shoulder: Secondary | ICD-10-CM

## 2019-09-10 DIAGNOSIS — M7541 Impingement syndrome of right shoulder: Secondary | ICD-10-CM

## 2019-09-12 ENCOUNTER — Other Ambulatory Visit: Payer: Self-pay | Admitting: Internal Medicine

## 2019-09-12 NOTE — Telephone Encounter (Signed)
Patient called to follow up on refill request °

## 2019-09-20 DIAGNOSIS — M25519 Pain in unspecified shoulder: Secondary | ICD-10-CM | POA: Diagnosis not present

## 2019-09-25 IMAGING — DX DG FINGER INDEX 2+V*L*
3 series · 3 of 3 positions shown · non-contrast
Comparison: None.

CLINICAL DATA: 66-year-old male jammed finger 3 weeks ago. Initial
encounter.

EXAM:
LEFT INDEX FINGER 2+V

[finger pa]
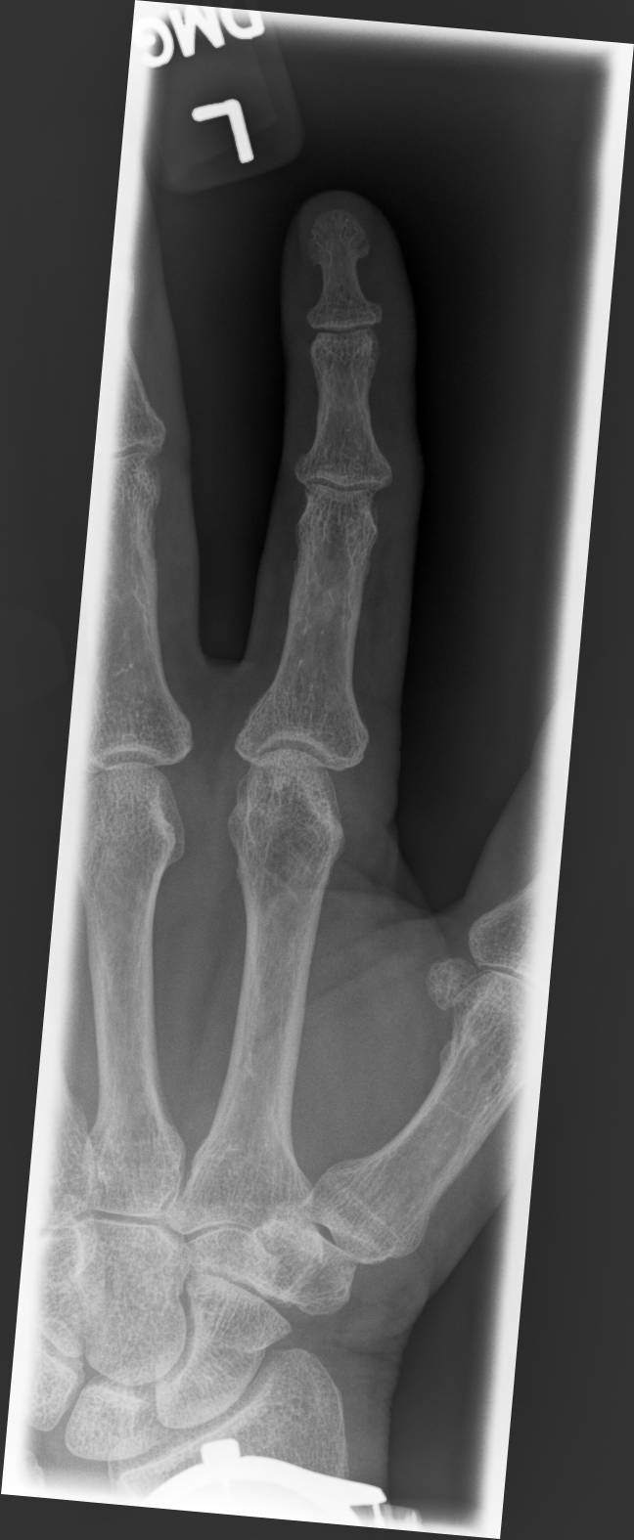

[finger mlo]
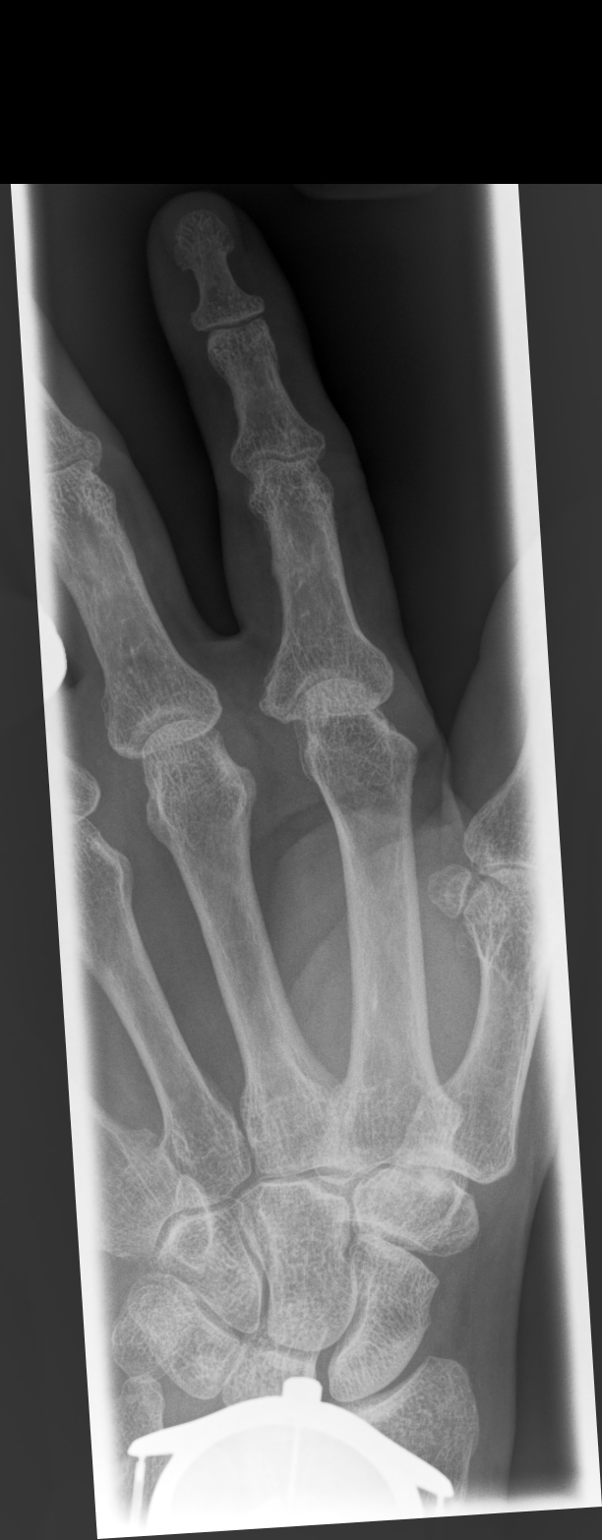

[finger lat]
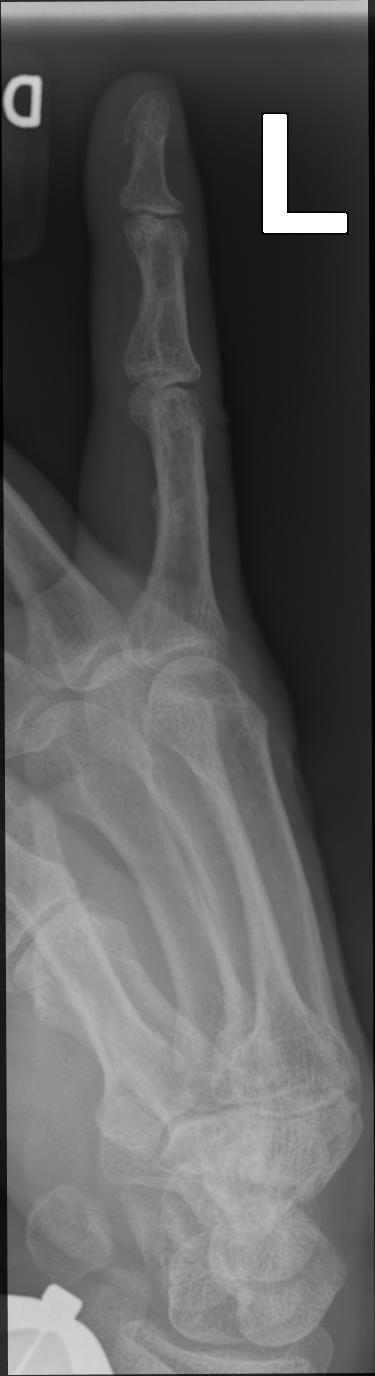

[3 of 3 positions shown; findings below may reference images not displayed]

FINDINGS: There is no evidence of fracture or dislocation. Mild soft tissue
prominence left second proximal interphalangeal joint space.
IMPRESSION: No fracture or dislocation.

Mild soft tissue prominence left second proximal interphalangeal
joint space.

## 2019-09-27 DIAGNOSIS — M25519 Pain in unspecified shoulder: Secondary | ICD-10-CM | POA: Diagnosis not present

## 2019-10-03 ENCOUNTER — Encounter: Payer: Self-pay | Admitting: *Deleted

## 2019-10-03 ENCOUNTER — Telehealth: Payer: Self-pay | Admitting: *Deleted

## 2019-10-03 ENCOUNTER — Ambulatory Visit: Payer: Medicare Other

## 2019-10-03 ENCOUNTER — Other Ambulatory Visit (HOSPITAL_COMMUNITY)
Admission: RE | Admit: 2019-10-03 | Discharge: 2019-10-03 | Disposition: A | Discharge status: Home / Self Care | Payer: Medicare Other | Source: Ambulatory Visit | Attending: Pulmonary Disease | Admitting: Pulmonary Disease

## 2019-10-03 DIAGNOSIS — M25519 Pain in unspecified shoulder: Secondary | ICD-10-CM | POA: Diagnosis not present

## 2019-10-03 DIAGNOSIS — G4700 Insomnia, unspecified: Secondary | ICD-10-CM | POA: Diagnosis not present

## 2019-10-03 DIAGNOSIS — Z01812 Encounter for preprocedural laboratory examination: Secondary | ICD-10-CM | POA: Diagnosis not present

## 2019-10-03 DIAGNOSIS — Z20822 Contact with and (suspected) exposure to covid-19: Secondary | ICD-10-CM | POA: Insufficient documentation

## 2019-10-03 DIAGNOSIS — E0520 Thyrotoxicosis with toxic multinodular goiter without thyrotoxic crisis or storm: Secondary | ICD-10-CM | POA: Diagnosis not present

## 2019-10-03 DIAGNOSIS — R5383 Other fatigue: Secondary | ICD-10-CM | POA: Diagnosis not present

## 2019-10-03 DIAGNOSIS — E538 Deficiency of other specified B group vitamins: Secondary | ICD-10-CM | POA: Diagnosis not present

## 2019-10-03 LAB — SARS CORONAVIRUS 2 (TAT 6-24 HRS): SARS Coronavirus 2: NEGATIVE

## 2019-10-03 NOTE — Telephone Encounter (Signed)
May overbook.    Thanks!

## 2019-10-03 NOTE — Telephone Encounter (Signed)
Is it ok to double book?  He is scheduled for 1:30 pm and you have a 4:00 opening I could block if that's ok.

## 2019-10-03 NOTE — Telephone Encounter (Signed)
John, Could you please review this patient's chart?  He is scheduled for a colon with Dr. Tarri Glenn on 10/22/19.   He has a hx of CAD, an MI and COPD.  I don't see that he is on O2 at home but did see mention of "severe emphysema".  Last echo was 01/24/17 and EF was 55- 60%.  Please advise if he is appropriate for [facility].   Thank you! Lanelle Bal

## 2019-10-03 NOTE — Telephone Encounter (Signed)
Appointment changed to Endo/colon per Dr. Tarri Glenn.

## 2019-10-03 NOTE — Telephone Encounter (Signed)
Dr. Tarri Glenn, I am prepping this patient's chart for a PV for upcoming colonoscopy.  The last note is from 12/02/18 where you advised an endo/colon for esophageal thickening on a CT scan and screening colon.  I do no see where he had either procedure.  Would you like for him to have an EGD at this time? Thank you, Lanelle Bal

## 2019-10-03 NOTE — Telephone Encounter (Signed)
Yes, please schedule the EGD as well. Thank you.

## 2019-10-04 NOTE — Telephone Encounter (Signed)
Natalie,  This pt is cleared for anesthetic care at [facility].  Thanks,  Chizuko Trine 

## 2019-10-06 ENCOUNTER — Ambulatory Visit: Payer: Medicare Other | Admitting: Internal Medicine

## 2019-10-06 ENCOUNTER — Ambulatory Visit: Payer: Medicare Other | Admitting: Pulmonary Disease

## 2019-10-09 ENCOUNTER — Ambulatory Visit
Admission: RE | Admit: 2019-10-09 | Discharge: 2019-10-09 | Disposition: A | Discharge status: Home / Self Care | Payer: Medicare Other | Source: Ambulatory Visit | Attending: Orthopedic Surgery | Admitting: Orthopedic Surgery

## 2019-10-09 ENCOUNTER — Other Ambulatory Visit: Payer: Self-pay

## 2019-10-09 DIAGNOSIS — M25511 Pain in right shoulder: Secondary | ICD-10-CM

## 2019-10-09 DIAGNOSIS — M7541 Impingement syndrome of right shoulder: Secondary | ICD-10-CM

## 2019-10-13 ENCOUNTER — Other Ambulatory Visit: Payer: Self-pay

## 2019-10-13 ENCOUNTER — Ambulatory Visit (AMBULATORY_SURGERY_CENTER): Payer: Self-pay | Admitting: *Deleted

## 2019-10-13 VITALS — Ht 73.0 in | Wt 218.0 lb

## 2019-10-13 DIAGNOSIS — Z1211 Encounter for screening for malignant neoplasm of colon: Secondary | ICD-10-CM

## 2019-10-13 DIAGNOSIS — R933 Abnormal findings on diagnostic imaging of other parts of digestive tract: Secondary | ICD-10-CM

## 2019-10-13 MED ORDER — SUPREP BOWEL PREP KIT 17.5-3.13-1.6 GM/177ML PO SOLN: 1.0000 | Freq: Once | ORAL | 0 refills | Status: AC

## 2019-10-13 NOTE — Progress Notes (Signed)
COV VAX X 2    No egg or soy allergy known to patient  No issues with past sedation with any surgeries or procedures no intubation problems in the past  No FH of Malignant Hyperthermia No diet pills per patient No home 02 use per patient  No blood thinners per patient  Pt denies issues with constipation  No A fib or A flutter  EMMI video to pt or via [place] 19 guidelines implemented in PV today with Pt and RN    5 YRS AGO PREP POOR- WILL DO A 2 DAY SUPREP    Due to the COVID-19 pandemic we are asking patients to follow these guidelines. Please only bring one care partner. Please be aware that your care partner may wait in the car in the parking lot or if they feel like they will be too hot to wait in the car, they may wait in the lobby on the 4th floor. All care partners are required to wear a mask the entire time (we do not have any that we can provide them), they need to practice social distancing, and we will do a Covid check for all patient's and care partners when you arrive. Also we will check their temperature and your temperature. If the care partner waits in their car they need to stay in the parking lot the entire time and we will call them on their cell phone when the patient is ready for discharge so they can bring the car to the front of the building. Also all patient's will need to wear a mask into building.

## 2019-10-15 ENCOUNTER — Telehealth: Payer: Self-pay | Admitting: Gastroenterology

## 2019-10-15 NOTE — Telephone Encounter (Signed)
Pt called- states he is not in the best mental health shape right now, he has a lot on him currently,  he was given a 2 day prep due to last colon prep poor, he states he has a lot on him and he feels like he cannot prep correctly at this time and he wishes to cancel the 10-13 colon for now.  I canceled the colon for him, encouraged him to call once he feels better to RS colon- he verbalized understanding and states he will CB to RS once he is better and feels like he can prep correctly   Lelan Pons pv

## 2019-10-15 NOTE — Telephone Encounter (Signed)
Returned call, no answer- lmtrc - marie  PV

## 2019-10-22 ENCOUNTER — Encounter: Payer: Medicare Other | Admitting: Gastroenterology

## 2019-10-27 DIAGNOSIS — M7541 Impingement syndrome of right shoulder: Secondary | ICD-10-CM | POA: Diagnosis not present

## 2019-10-30 ENCOUNTER — Other Ambulatory Visit: Payer: Self-pay | Admitting: Internal Medicine

## 2019-10-30 DIAGNOSIS — M7521 Bicipital tendinitis, right shoulder: Secondary | ICD-10-CM | POA: Diagnosis not present

## 2019-10-30 DIAGNOSIS — J410 Simple chronic bronchitis: Secondary | ICD-10-CM

## 2019-10-30 DIAGNOSIS — J441 Chronic obstructive pulmonary disease with (acute) exacerbation: Secondary | ICD-10-CM

## 2019-11-05 DIAGNOSIS — M19019 Primary osteoarthritis, unspecified shoulder: Secondary | ICD-10-CM | POA: Diagnosis not present

## 2019-11-05 DIAGNOSIS — M25611 Stiffness of right shoulder, not elsewhere classified: Secondary | ICD-10-CM | POA: Diagnosis not present

## 2019-11-05 DIAGNOSIS — M7541 Impingement syndrome of right shoulder: Secondary | ICD-10-CM | POA: Diagnosis not present

## 2019-11-05 DIAGNOSIS — M25511 Pain in right shoulder: Secondary | ICD-10-CM | POA: Diagnosis not present

## 2019-11-05 DIAGNOSIS — M6281 Muscle weakness (generalized): Secondary | ICD-10-CM | POA: Diagnosis not present

## 2019-11-11 DIAGNOSIS — M19019 Primary osteoarthritis, unspecified shoulder: Secondary | ICD-10-CM | POA: Diagnosis not present

## 2019-11-11 DIAGNOSIS — M25511 Pain in right shoulder: Secondary | ICD-10-CM | POA: Diagnosis not present

## 2019-11-11 DIAGNOSIS — M7541 Impingement syndrome of right shoulder: Secondary | ICD-10-CM | POA: Diagnosis not present

## 2019-11-11 DIAGNOSIS — M6281 Muscle weakness (generalized): Secondary | ICD-10-CM | POA: Diagnosis not present

## 2019-11-11 DIAGNOSIS — M25611 Stiffness of right shoulder, not elsewhere classified: Secondary | ICD-10-CM | POA: Diagnosis not present

## 2019-11-12 DIAGNOSIS — M6281 Muscle weakness (generalized): Secondary | ICD-10-CM | POA: Diagnosis not present

## 2019-11-12 DIAGNOSIS — M25611 Stiffness of right shoulder, not elsewhere classified: Secondary | ICD-10-CM | POA: Diagnosis not present

## 2019-11-12 DIAGNOSIS — M19019 Primary osteoarthritis, unspecified shoulder: Secondary | ICD-10-CM | POA: Diagnosis not present

## 2019-11-12 DIAGNOSIS — M25511 Pain in right shoulder: Secondary | ICD-10-CM | POA: Diagnosis not present

## 2019-11-12 DIAGNOSIS — M7541 Impingement syndrome of right shoulder: Secondary | ICD-10-CM | POA: Diagnosis not present

## 2019-11-14 DIAGNOSIS — M25611 Stiffness of right shoulder, not elsewhere classified: Secondary | ICD-10-CM | POA: Diagnosis not present

## 2019-11-14 DIAGNOSIS — M7541 Impingement syndrome of right shoulder: Secondary | ICD-10-CM | POA: Diagnosis not present

## 2019-11-14 DIAGNOSIS — M19019 Primary osteoarthritis, unspecified shoulder: Secondary | ICD-10-CM | POA: Diagnosis not present

## 2019-11-14 DIAGNOSIS — M6281 Muscle weakness (generalized): Secondary | ICD-10-CM | POA: Diagnosis not present

## 2019-11-14 DIAGNOSIS — M25511 Pain in right shoulder: Secondary | ICD-10-CM | POA: Diagnosis not present

## 2019-11-17 DIAGNOSIS — M19019 Primary osteoarthritis, unspecified shoulder: Secondary | ICD-10-CM | POA: Diagnosis not present

## 2019-11-17 DIAGNOSIS — M7541 Impingement syndrome of right shoulder: Secondary | ICD-10-CM | POA: Diagnosis not present

## 2019-11-17 DIAGNOSIS — M25511 Pain in right shoulder: Secondary | ICD-10-CM | POA: Diagnosis not present

## 2019-11-17 DIAGNOSIS — M25611 Stiffness of right shoulder, not elsewhere classified: Secondary | ICD-10-CM | POA: Diagnosis not present

## 2019-11-17 DIAGNOSIS — M6281 Muscle weakness (generalized): Secondary | ICD-10-CM | POA: Diagnosis not present

## 2019-11-19 DIAGNOSIS — M25511 Pain in right shoulder: Secondary | ICD-10-CM | POA: Diagnosis not present

## 2019-11-19 DIAGNOSIS — M19019 Primary osteoarthritis, unspecified shoulder: Secondary | ICD-10-CM | POA: Diagnosis not present

## 2019-11-19 DIAGNOSIS — M6281 Muscle weakness (generalized): Secondary | ICD-10-CM | POA: Diagnosis not present

## 2019-11-19 DIAGNOSIS — M7541 Impingement syndrome of right shoulder: Secondary | ICD-10-CM | POA: Diagnosis not present

## 2019-11-19 DIAGNOSIS — M25611 Stiffness of right shoulder, not elsewhere classified: Secondary | ICD-10-CM | POA: Diagnosis not present

## 2019-12-01 ENCOUNTER — Ambulatory Visit: Payer: Medicare Other | Admitting: Emergency Medicine

## 2019-12-31 ENCOUNTER — Emergency Department (HOSPITAL_COMMUNITY)
Admission: EM | Admit: 2019-12-31 | Discharge: 2019-12-31 | Disposition: A | Discharge status: Home / Self Care | Payer: Medicare Other | Attending: Emergency Medicine | Admitting: Emergency Medicine

## 2019-12-31 ENCOUNTER — Emergency Department (HOSPITAL_COMMUNITY): Payer: Medicare Other

## 2019-12-31 DIAGNOSIS — F17210 Nicotine dependence, cigarettes, uncomplicated: Secondary | ICD-10-CM | POA: Insufficient documentation

## 2019-12-31 DIAGNOSIS — W01198A Fall on same level from slipping, tripping and stumbling with subsequent striking against other object, initial encounter: Secondary | ICD-10-CM | POA: Diagnosis not present

## 2019-12-31 DIAGNOSIS — M25519 Pain in unspecified shoulder: Secondary | ICD-10-CM | POA: Insufficient documentation

## 2019-12-31 DIAGNOSIS — W19XXXA Unspecified fall, initial encounter: Secondary | ICD-10-CM

## 2019-12-31 DIAGNOSIS — S0990XA Unspecified injury of head, initial encounter: Secondary | ICD-10-CM | POA: Diagnosis not present

## 2019-12-31 DIAGNOSIS — Z23 Encounter for immunization: Secondary | ICD-10-CM | POA: Diagnosis not present

## 2019-12-31 DIAGNOSIS — Z7982 Long term (current) use of aspirin: Secondary | ICD-10-CM | POA: Diagnosis not present

## 2019-12-31 DIAGNOSIS — Y92009 Unspecified place in unspecified non-institutional (private) residence as the place of occurrence of the external cause: Secondary | ICD-10-CM | POA: Diagnosis not present

## 2019-12-31 DIAGNOSIS — Z043 Encounter for examination and observation following other accident: Secondary | ICD-10-CM | POA: Diagnosis not present

## 2019-12-31 DIAGNOSIS — J449 Chronic obstructive pulmonary disease, unspecified: Secondary | ICD-10-CM | POA: Diagnosis not present

## 2019-12-31 DIAGNOSIS — N189 Chronic kidney disease, unspecified: Secondary | ICD-10-CM | POA: Diagnosis not present

## 2019-12-31 DIAGNOSIS — I2510 Atherosclerotic heart disease of native coronary artery without angina pectoris: Secondary | ICD-10-CM | POA: Insufficient documentation

## 2019-12-31 DIAGNOSIS — S39012A Strain of muscle, fascia and tendon of lower back, initial encounter: Secondary | ICD-10-CM | POA: Insufficient documentation

## 2019-12-31 DIAGNOSIS — M549 Dorsalgia, unspecified: Secondary | ICD-10-CM | POA: Diagnosis not present

## 2019-12-31 DIAGNOSIS — I10 Essential (primary) hypertension: Secondary | ICD-10-CM | POA: Diagnosis not present

## 2019-12-31 DIAGNOSIS — S34109A Unspecified injury to unspecified level of lumbar spinal cord, initial encounter: Secondary | ICD-10-CM | POA: Diagnosis present

## 2019-12-31 LAB — CBC WITH DIFFERENTIAL/PLATELET
Abs Immature Granulocytes: 0.07 10*3/uL (ref 0.00–0.07)
Basophils Absolute: 0.1 10*3/uL (ref 0.0–0.1)
Basophils Relative: 1 %
Eosinophils Absolute: 0.2 10*3/uL (ref 0.0–0.5)
Eosinophils Relative: 2 %
HCT: 50.6 % (ref 39.0–52.0)
Hemoglobin: 17.1 g/dL — ABNORMAL HIGH (ref 13.0–17.0)
Immature Granulocytes: 1 %
Lymphocytes Relative: 22 %
Lymphs Abs: 2.5 10*3/uL (ref 0.7–4.0)
MCH: 29.4 pg (ref 26.0–34.0)
MCHC: 33.8 g/dL (ref 30.0–36.0)
MCV: 87.1 fL (ref 80.0–100.0)
Monocytes Absolute: 0.7 10*3/uL (ref 0.1–1.0)
Monocytes Relative: 6 %
Neutro Abs: 7.8 10*3/uL — ABNORMAL HIGH (ref 1.7–7.7)
Neutrophils Relative %: 68 %
Platelets: 164 10*3/uL (ref 150–400)
RBC: 5.81 MIL/uL (ref 4.22–5.81)
RDW: 13.3 % (ref 11.5–15.5)
WBC: 11.3 10*3/uL — ABNORMAL HIGH (ref 4.0–10.5)
nRBC: 0 % (ref 0.0–0.2)

## 2019-12-31 LAB — BASIC METABOLIC PANEL
Anion gap: 9 (ref 5–15)
BUN: 15 mg/dL (ref 8–23)
CO2: 27 mmol/L (ref 22–32)
Calcium: 8.8 mg/dL — ABNORMAL LOW (ref 8.9–10.3)
Chloride: 103 mmol/L (ref 98–111)
Creatinine, Ser: 0.92 mg/dL (ref 0.61–1.24)
GFR, Estimated: >60 mL/min (ref >60)
Glucose, Bld: 109 mg/dL — ABNORMAL HIGH (ref 70–99)
Potassium: 4.1 mmol/L (ref 3.5–5.1)
Sodium: 139 mmol/L (ref 135–145)

## 2019-12-31 MED ORDER — HYDROMORPHONE HCL 1 MG/ML IJ SOLN
1.0000 mg | Freq: Once | INTRAMUSCULAR | Status: AC
  Administered 2019-12-31: 1 mg via INTRAVENOUS
  Filled 2019-12-31: qty 1

## 2019-12-31 NOTE — ED Triage Notes (Signed)
Pt to Er via EMS from home after having a ground level fall at home.  PT states that his legs went out under him and he fell back and hit the back of his head.  PT reports chronic back pain that got suddenly worse before his legs gave out.  No LOC.  No blood thinners.  NADN.

## 2019-12-31 NOTE — Discharge Instructions (Signed)
Take your regular medications as prescribed.  Return to ER if you have any additional falls.  Return if you have any fever, uncontrolled pain, develop numbness, weakness, bladder or bowel incontinence, other new concerning symptom.

## 2019-12-31 NOTE — ED Provider Notes (Signed)
David Hendricks   CSN: [id number] Arrival date & time: 12/31/19  2019     History Chief Complaint  Patient presents with  . Fall    David Hendricks is a 69 y.o. male.  Presents to ER with concern for fall.  Patient reports that he struggles with chronic shoulder pain as well as some back pain.  Reports that he had a severe flare of his chronic plain when all of a sudden his legs gave out and he fell to the ground.  Hit his head on chair.  Did not lose consciousness.  Was concerned that he had tingling sensation in his legs after the fall however this has since resolved.  Still having right shoulder pain.  Also endorsing lower back pain.  Does not think he hit his back during the fall.  No bladder or bowel incontinence, no urinary retention, no saddle anesthesia.  No fevers.  HPI     Past Medical History:  Diagnosis Date  . Allergy   . Anxiety   . Aortic atherosclerosis ([place])   . Arthritis   . Benign prostatic hyperplasia (BPH) with urinary urgency   . Chronic kidney disease    KIDNEY STONES  . COPD (chronic obstructive pulmonary disease) ([place])    possible   . Coronary artery disease    10/18 PCI/DES to pLAD, mild nonobstructive disease in the Lcx/RCA. Normal EF.   Marland Kitchen Depression   . Dyslipidemia   . Dysrhythmia 2016   irregular heartbeat  . Gallstones   . GERD (gastroesophageal reflux disease)   . History of kidney stones   . Hyperlipidemia   . Hyperthyroidism   . Leg cramps   . Multiple pulmonary nodules   . Myocardial infarction ([place])    2018  . Pneumonia   . Seasonal allergies   . Thyroid nodule   . Vertigo   . Vitamin B 12 deficiency     Patient Active Problem List   Diagnosis Date Noted  . Pulmonary emphysema ([place]) 06/02/2019  . Aortic atherosclerosis ([place]) 06/02/2019  . Smokers' cough ([place]) 06/02/2019  . 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  . Multinodular goiter 12/24/2018  . Subclinical hyperthyroidism  12/24/2018  . BPH (benign prostatic hyperplasia) 11/20/2018  . Pulmonary nodules 11/15/2018  . Esophageal thickening 11/15/2018  . Cigarette nicotine dependence without complication [id number]  . Old MI (myocardial infarction) 01/30/2017  . CAD (coronary artery disease) 01/30/2017  . Hyperlipidemia 10/10/2016  . History of non-ST elevation myocardial infarction (NSTEMI) 10/09/2016  . Tobacco abuse 08/28/2016  . Difficulty sleeping 04/22/2016  . Benign prostatic hyperplasia with urinary frequency 02/11/2016    Past Surgical History:  Procedure Laterality Date  . COLONOSCOPY    . CORONARY ANGIOPLASTY  10/09/2016  . CORONARY STENT INTERVENTION N/A 10/09/2016   Procedure: CORONARY STENT INTERVENTION;  Surgeon: Nelva Bush, MD;  Location: [facility];  Service: Cardiovascular;  Laterality: N/A;  . CYSTOSCOPY WITH RETROGRADE PYELOGRAM, URETEROSCOPY AND STENT PLACEMENT Left 06/17/2019   Procedure: CYSTOSCOPY WITH RETROGRADE PYELOGRAM, URETEROSCOPY AND STENT PLACEMENT;  Surgeon: Robley Fries, MD;  Location: [facility];  Service: Urology;  Laterality: Left;  1 HR  . HOLMIUM LASER APPLICATION Left [id number]   Procedure: HOLMIUM LASER APPLICATION;  Surgeon: Robley Fries, MD;  Location: [facility];  Service: Urology;  Laterality: Left;  . INTRAVASCULAR ULTRASOUND/IVUS N/A 10/09/2016   Procedure: Intravascular Ultrasound/IVUS;  Surgeon: Nelva Bush, MD;  Location: [facility];  Service: Cardiovascular;  Laterality:  N/A;  . IR CHOLANGIOGRAM EXISTING TUBE  11/18/2018  . IR PERC CHOLECYSTOSTOMY  11/16/2018  . LEFT HEART CATH AND CORONARY ANGIOGRAPHY N/A 10/09/2016   Procedure: LEFT HEART CATH AND CORONARY ANGIOGRAPHY;  Surgeon: Nelva Bush, MD;  Location: [facility];  Service: Cardiovascular;  Laterality: N/A;       Family History  Problem Relation Age of Onset  . Alcohol abuse Father        called [place] per pt  . Cancer Father        OAK CELL CANCER  PER PT NON SURGICAL   . Colon cancer Neg Hx   . Colon polyps Neg Hx   . Esophageal cancer Neg Hx   . Rectal cancer Neg Hx   . Stomach cancer Neg Hx     Social History   Tobacco Use  . Smoking status: Current Every Day Smoker    Packs/day: 2.00    Years: 45.00    Pack years: 90.00    Types: Cigarettes    Start date: 01/09/1961  . Smokeless tobacco: Never Used  . Tobacco comment: tobacco info given  Vaping Use  . Vaping Use: Never used  Substance Use Topics  . Alcohol use: No  . Drug use: No    Home Medications Prior to Admission medications   Medication Sig Start Date End Date Taking? Authorizing Provider  albuterol (VENTOLIN HFA) 108 (90 Base) MCG/ACT inhaler INHALE 2 PUFFS INTO THE LUNGS EVERY 6 HOURS AS NEEDED 10/30/19   Spero Geralds, MD  aspirin EC 81 MG tablet Take 81 mg by mouth daily.    [provider]  atorvastatin (LIPITOR) 80 MG tablet Take 1 tablet (80 mg total) by mouth daily. 05/08/19   [name], MD  budesonide-formoterol [facility]) 80-4.5 MCG/ACT inhaler Inhale 2 puffs into the lungs 2 (two) times daily. 06/02/19   Horald Pollen, MD  methimazole (TAPAZOLE) 5 MG tablet TAKE 1 TABLET(5 MG) BY MOUTH DAILY 09/12/19   Shamleffer, Melanie Crazier, MD  naproxen sodium (ALEVE) 220 MG tablet Take 220 mg by mouth daily as needed (mild pain).    [provider]  ondansetron (ZOFRAN) 4 MG tablet Take 1 tablet (4 mg total) by mouth every 6 (six) hours as needed for nausea or vomiting. 06/12/19   Orpah Greek, MD  oxyCODONE-acetaminophen (PERCOCET) 5-325 MG tablet Take 1 tablet by mouth every 4 (four) hours as needed. Patient not taking: Reported on 10/13/2019 06/12/19   Orpah Greek, MD  pantoprazole (PROTONIX) 40 MG tablet TAKE 1 TABLET BY MOUTH EVERY DAY Patient taking differently: Take 40 mg by mouth daily.  05/21/19   Libby Maw, MD  phenazopyridine (PYRIDIUM) 100 MG tablet Take 1 tablet (100 mg total) by mouth  3 (three) times daily as needed for pain. 06/17/19 06/16/20  Robley Fries, MD  tamsulosin (FLOMAX) 0.4 MG CAPS capsule Take 0.4 mg by mouth daily.     [provider]  traMADol Veatrice Bourbon) 50 MG tablet Take by mouth. 09/29/19   [provider]  traZODone (DESYREL) 50 MG tablet Take 50 mg by mouth at bedtime. 08/19/19   [provider]    Allergies    Patient has no known allergies.  Review of Systems   Review of Systems  Constitutional: Negative for chills and fever.  HENT: Negative for ear pain and sore throat.   Eyes: Negative for pain and visual disturbance.  Respiratory: Negative for cough and shortness  of breath.   Cardiovascular: Negative for chest pain and palpitations.  Gastrointestinal: Negative for abdominal pain and vomiting.  Genitourinary: Negative for dysuria and hematuria.  Musculoskeletal: Positive for arthralgias and back pain.  Skin: Negative for color change and rash.  Neurological: Positive for numbness. Negative for seizures and syncope.  All other systems reviewed and are negative.   Physical Exam Updated Vital Signs BP 118/71   Pulse 71   Temp 98.4 F (36.9 C) (Oral)   Resp 19   Ht 6\' 1"  (1.854 m)   Wt 97.5 kg   SpO2 (!) 89%   BMI 28.37 kg/m   Physical Exam Vitals and nursing Hendricks reviewed.  Constitutional:      Appearance: He is well-developed and well-nourished.  HENT:     Head: Normocephalic and atraumatic.  Eyes:     Conjunctiva/sclera: Conjunctivae normal.  Cardiovascular:     Rate and Rhythm: Normal rate and regular rhythm.     Heart sounds: No murmur heard.   Pulmonary:     Effort: Pulmonary effort is normal. No respiratory distress.     Breath sounds: Normal breath sounds.  Abdominal:     Palpations: Abdomen is soft.     Tenderness: There is no abdominal tenderness.  Musculoskeletal:        General: No edema.     Cervical back: Neck supple.     Comments: No deformity or tenderness noted throughout all 4  extremities  Skin:    General: Skin is warm and dry.  Neurological:     General: No focal deficit present.     Mental Status: He is alert.     Comments: Normal sensation and strength in lower extremities  Psychiatric:        Mood and Affect: Mood and affect normal.     ED Results / Procedures / Treatments   Labs (all labs ordered are listed, but only abnormal results are displayed) Labs Reviewed  CBC WITH DIFFERENTIAL/PLATELET - Abnormal; Notable for the following components:      Result Value   WBC 11.3 (*)    Hemoglobin 17.1 (*)    Neutro Abs 7.8 (*)    All other components within normal limits  BASIC METABOLIC PANEL - Abnormal; Notable for the following components:   Glucose, Bld 109 (*)    Calcium 8.8 (*)    All other components within normal limits    EKG None  Radiology DG Lumbar Spine Complete  Result Date: 12/31/2019 CLINICAL DATA:  Status post fall. EXAM: LUMBAR SPINE - COMPLETE 4+ VIEW COMPARISON:  None. FINDINGS: There is no evidence of lumbar spine fracture. Approximately 4 mm retrolisthesis of the L5 vertebral body is noted on S1. Mild to moderate severity multilevel endplate sclerosis is seen. This is slightly more prominent at the level of L5-S1. A mild to moderate severity intervertebral disc space narrowing and associated vacuum disc phenomenon is seen at this level. Marked severity calcification of the abdominal aorta and bilateral common iliac arteries is noted. IMPRESSION: 1. No acute findings. 2. 4 mm retrolisthesis of the L5 vertebral body on S1. 3. Moderate severity multilevel degenerative disc disease, slightly more prominent at L5-S1. Electronically Signed   By: Virgina Norfolk M.D.   On: 12/31/2019 21:53   CT Head Wo Contrast  Result Date: 12/31/2019 CLINICAL DATA:  Status post fall. EXAM: CT HEAD WITHOUT CONTRAST TECHNIQUE: Contiguous axial images were obtained from the base of the skull through the vertex without intravenous contrast. COMPARISON:  April 23, 2019 FINDINGS: Brain: There is mild cerebral atrophy with widening of the extra-axial spaces and ventricular dilatation. There are areas of decreased attenuation within the white matter tracts of the supratentorial brain, consistent with microvascular disease changes. Vascular: No hyperdense vessel or unexpected calcification. Skull: Normal. Negative for fracture or focal lesion. Sinuses/Orbits: No acute finding. Other: None. IMPRESSION: 1. Mild cerebral atrophy and microvascular disease changes of the supratentorial brain. 2. No acute intracranial abnormality. Electronically Signed   By: Virgina Norfolk M.D.   On: 12/31/2019 21:42    Procedures Procedures (including critical care time)  Medications Ordered in ED Medications  HYDROmorphone (DILAUDID) injection 1 mg (1 mg Intravenous Given 12/31/19 2215)    ED Course  I have reviewed the triage vital signs and the nursing notes.  Pertinent labs & imaging results that were available during my care of the patient were reviewed by me and considered in my medical decision making (see chart for details).    MDM Rules/Calculators/A&P                         69 year old male presents to ER after fall.  On exam well-appearing, neuro intact.  Having some back pain.  Also reported head trauma.  CT head was negative, lumbar x-rays negative for acute traumatic pathology.  Recommend follow-up with primary doctor.   After the discussed management above, the patient was determined to be safe for discharge.  The patient was in agreement with this plan and all questions regarding their care were answered.  ED return precautions were discussed and the patient will return to the ED with any significant worsening of condition.  Final Clinical Impression(s) / ED Diagnoses Final diagnoses:  Fall, initial encounter  Strain of lumbar region, initial encounter    Rx / DC Orders ED Discharge Orders    None       Lucrezia Starch, MD 01/01/20  [phone]

## 2020-01-14 DIAGNOSIS — K130 Diseases of lips: Secondary | ICD-10-CM | POA: Diagnosis not present

## 2020-01-14 DIAGNOSIS — M791 Myalgia, unspecified site: Secondary | ICD-10-CM | POA: Diagnosis not present

## 2020-01-14 DIAGNOSIS — M25519 Pain in unspecified shoulder: Secondary | ICD-10-CM | POA: Diagnosis not present

## 2020-01-14 DIAGNOSIS — I700 Atherosclerosis of aorta: Secondary | ICD-10-CM | POA: Diagnosis not present

## 2020-01-14 DIAGNOSIS — J439 Emphysema, unspecified: Secondary | ICD-10-CM | POA: Diagnosis not present

## 2020-01-14 DIAGNOSIS — R5383 Other fatigue: Secondary | ICD-10-CM | POA: Diagnosis not present

## 2020-02-15 ENCOUNTER — Emergency Department (HOSPITAL_COMMUNITY): Payer: Medicare Other

## 2020-02-15 ENCOUNTER — Encounter (HOSPITAL_COMMUNITY): Payer: Self-pay | Admitting: Internal Medicine

## 2020-02-15 ENCOUNTER — Inpatient Hospital Stay (HOSPITAL_COMMUNITY)
Admission: EM | Admit: 2020-02-15 | Discharge: 2020-02-24 | DRG: 041 | Disposition: A | Discharge status: Rehabilitation Facility | Payer: Medicare Other | Attending: Internal Medicine | Admitting: Internal Medicine

## 2020-02-15 ENCOUNTER — Inpatient Hospital Stay (HOSPITAL_COMMUNITY): Payer: Medicare Other

## 2020-02-15 DIAGNOSIS — F32A Depression, unspecified: Secondary | ICD-10-CM | POA: Diagnosis present

## 2020-02-15 DIAGNOSIS — Z7982 Long term (current) use of aspirin: Secondary | ICD-10-CM | POA: Diagnosis not present

## 2020-02-15 DIAGNOSIS — R471 Dysarthria and anarthria: Secondary | ICD-10-CM | POA: Diagnosis present

## 2020-02-15 DIAGNOSIS — Z6829 Body mass index (BMI) 29.0-29.9, adult: Secondary | ICD-10-CM

## 2020-02-15 DIAGNOSIS — I252 Old myocardial infarction: Secondary | ICD-10-CM

## 2020-02-15 DIAGNOSIS — R17 Unspecified jaundice: Secondary | ICD-10-CM | POA: Diagnosis present

## 2020-02-15 DIAGNOSIS — R4701 Aphasia: Secondary | ICD-10-CM | POA: Diagnosis present

## 2020-02-15 DIAGNOSIS — R29702 NIHSS score 2: Secondary | ICD-10-CM | POA: Diagnosis present

## 2020-02-15 DIAGNOSIS — I700 Atherosclerosis of aorta: Secondary | ICD-10-CM | POA: Diagnosis present

## 2020-02-15 DIAGNOSIS — K5900 Constipation, unspecified: Secondary | ICD-10-CM | POA: Diagnosis not present

## 2020-02-15 DIAGNOSIS — I63411 Cerebral infarction due to embolism of right middle cerebral artery: Secondary | ICD-10-CM | POA: Diagnosis not present

## 2020-02-15 DIAGNOSIS — D751 Secondary polycythemia: Secondary | ICD-10-CM | POA: Diagnosis present

## 2020-02-15 DIAGNOSIS — I69322 Dysarthria following cerebral infarction: Secondary | ICD-10-CM

## 2020-02-15 DIAGNOSIS — Z20822 Contact with and (suspected) exposure to covid-19: Secondary | ICD-10-CM | POA: Diagnosis present

## 2020-02-15 DIAGNOSIS — E041 Nontoxic single thyroid nodule: Secondary | ICD-10-CM | POA: Diagnosis present

## 2020-02-15 DIAGNOSIS — J438 Other emphysema: Secondary | ICD-10-CM | POA: Diagnosis not present

## 2020-02-15 DIAGNOSIS — I63511 Cerebral infarction due to unspecified occlusion or stenosis of right middle cerebral artery: Secondary | ICD-10-CM | POA: Diagnosis not present

## 2020-02-15 DIAGNOSIS — I129 Hypertensive chronic kidney disease with stage 1 through stage 4 chronic kidney disease, or unspecified chronic kidney disease: Secondary | ICD-10-CM | POA: Diagnosis present

## 2020-02-15 DIAGNOSIS — Z79899 Other long term (current) drug therapy: Secondary | ICD-10-CM | POA: Diagnosis not present

## 2020-02-15 DIAGNOSIS — J432 Centrilobular emphysema: Secondary | ICD-10-CM | POA: Diagnosis present

## 2020-02-15 DIAGNOSIS — Z7902 Long term (current) use of antithrombotics/antiplatelets: Secondary | ICD-10-CM | POA: Diagnosis not present

## 2020-02-15 DIAGNOSIS — I2510 Atherosclerotic heart disease of native coronary artery without angina pectoris: Secondary | ICD-10-CM | POA: Diagnosis present

## 2020-02-15 DIAGNOSIS — G8194 Hemiplegia, unspecified affecting left nondominant side: Secondary | ICD-10-CM | POA: Diagnosis present

## 2020-02-15 DIAGNOSIS — F17210 Nicotine dependence, cigarettes, uncomplicated: Secondary | ICD-10-CM | POA: Diagnosis present

## 2020-02-15 DIAGNOSIS — K219 Gastro-esophageal reflux disease without esophagitis: Secondary | ICD-10-CM | POA: Diagnosis present

## 2020-02-15 DIAGNOSIS — Z95818 Presence of other cardiac implants and grafts: Secondary | ICD-10-CM | POA: Diagnosis not present

## 2020-02-15 DIAGNOSIS — I6389 Other cerebral infarction: Secondary | ICD-10-CM | POA: Diagnosis not present

## 2020-02-15 DIAGNOSIS — E0590 Thyrotoxicosis, unspecified without thyrotoxic crisis or storm: Secondary | ICD-10-CM

## 2020-02-15 DIAGNOSIS — Z7189 Other specified counseling: Secondary | ICD-10-CM | POA: Diagnosis not present

## 2020-02-15 DIAGNOSIS — Z515 Encounter for palliative care: Secondary | ICD-10-CM | POA: Diagnosis not present

## 2020-02-15 DIAGNOSIS — J439 Emphysema, unspecified: Secondary | ICD-10-CM | POA: Diagnosis present

## 2020-02-15 DIAGNOSIS — E042 Nontoxic multinodular goiter: Secondary | ICD-10-CM | POA: Diagnosis present

## 2020-02-15 DIAGNOSIS — Z716 Tobacco abuse counseling: Secondary | ICD-10-CM | POA: Diagnosis not present

## 2020-02-15 DIAGNOSIS — E0520 Thyrotoxicosis with toxic multinodular goiter without thyrotoxic crisis or storm: Secondary | ICD-10-CM | POA: Diagnosis present

## 2020-02-15 DIAGNOSIS — B370 Candidal stomatitis: Secondary | ICD-10-CM | POA: Diagnosis not present

## 2020-02-15 DIAGNOSIS — Z955 Presence of coronary angioplasty implant and graft: Secondary | ICD-10-CM

## 2020-02-15 DIAGNOSIS — N400 Enlarged prostate without lower urinary tract symptoms: Secondary | ICD-10-CM | POA: Diagnosis present

## 2020-02-15 DIAGNOSIS — R29810 Facial weakness: Secondary | ICD-10-CM | POA: Diagnosis present

## 2020-02-15 DIAGNOSIS — I69391 Dysphagia following cerebral infarction: Secondary | ICD-10-CM

## 2020-02-15 DIAGNOSIS — Z7951 Long term (current) use of inhaled steroids: Secondary | ICD-10-CM

## 2020-02-15 DIAGNOSIS — L740 Miliaria rubra: Secondary | ICD-10-CM | POA: Diagnosis not present

## 2020-02-15 DIAGNOSIS — R1310 Dysphagia, unspecified: Secondary | ICD-10-CM | POA: Diagnosis present

## 2020-02-15 DIAGNOSIS — E049 Nontoxic goiter, unspecified: Secondary | ICD-10-CM

## 2020-02-15 DIAGNOSIS — R531 Weakness: Secondary | ICD-10-CM

## 2020-02-15 DIAGNOSIS — E079 Disorder of thyroid, unspecified: Secondary | ICD-10-CM | POA: Diagnosis not present

## 2020-02-15 DIAGNOSIS — E785 Hyperlipidemia, unspecified: Secondary | ICD-10-CM | POA: Diagnosis present

## 2020-02-15 DIAGNOSIS — R451 Restlessness and agitation: Secondary | ICD-10-CM | POA: Diagnosis not present

## 2020-02-15 DIAGNOSIS — E669 Obesity, unspecified: Secondary | ICD-10-CM | POA: Diagnosis present

## 2020-02-15 DIAGNOSIS — Z720 Tobacco use: Secondary | ICD-10-CM | POA: Diagnosis not present

## 2020-02-15 DIAGNOSIS — N189 Chronic kidney disease, unspecified: Secondary | ICD-10-CM | POA: Diagnosis present

## 2020-02-15 DIAGNOSIS — E7849 Other hyperlipidemia: Secondary | ICD-10-CM | POA: Diagnosis not present

## 2020-02-15 DIAGNOSIS — R059 Cough, unspecified: Secondary | ICD-10-CM | POA: Diagnosis not present

## 2020-02-15 DIAGNOSIS — R4781 Slurred speech: Secondary | ICD-10-CM | POA: Diagnosis not present

## 2020-02-15 DIAGNOSIS — Z87442 Personal history of urinary calculi: Secondary | ICD-10-CM | POA: Diagnosis not present

## 2020-02-15 DIAGNOSIS — Z79890 Hormone replacement therapy (postmenopausal): Secondary | ICD-10-CM | POA: Diagnosis not present

## 2020-02-15 DIAGNOSIS — E039 Hypothyroidism, unspecified: Secondary | ICD-10-CM | POA: Diagnosis not present

## 2020-02-15 DIAGNOSIS — E0789 Other specified disorders of thyroid: Secondary | ICD-10-CM

## 2020-02-15 DIAGNOSIS — R0902 Hypoxemia: Secondary | ICD-10-CM | POA: Diagnosis not present

## 2020-02-15 DIAGNOSIS — F419 Anxiety disorder, unspecified: Secondary | ICD-10-CM | POA: Diagnosis present

## 2020-02-15 DIAGNOSIS — I69392 Facial weakness following cerebral infarction: Secondary | ICD-10-CM | POA: Diagnosis not present

## 2020-02-15 DIAGNOSIS — I69354 Hemiplegia and hemiparesis following cerebral infarction affecting left non-dominant side: Secondary | ICD-10-CM | POA: Diagnosis not present

## 2020-02-15 DIAGNOSIS — I471 Supraventricular tachycardia: Secondary | ICD-10-CM | POA: Diagnosis present

## 2020-02-15 DIAGNOSIS — I639 Cerebral infarction, unspecified: Principal | ICD-10-CM

## 2020-02-15 DIAGNOSIS — R479 Unspecified speech disturbances: Secondary | ICD-10-CM | POA: Diagnosis not present

## 2020-02-15 DIAGNOSIS — R9431 Abnormal electrocardiogram [ECG] [EKG]: Secondary | ICD-10-CM | POA: Diagnosis not present

## 2020-02-15 DIAGNOSIS — R1312 Dysphagia, oropharyngeal phase: Secondary | ICD-10-CM | POA: Diagnosis not present

## 2020-02-15 DIAGNOSIS — Z9049 Acquired absence of other specified parts of digestive tract: Secondary | ICD-10-CM | POA: Diagnosis not present

## 2020-02-15 DIAGNOSIS — Z781 Physical restraint status: Secondary | ICD-10-CM

## 2020-02-15 LAB — DIFFERENTIAL
Abs Immature Granulocytes: 0.03 10*3/uL (ref 0.00–0.07)
Basophils Absolute: 0.1 10*3/uL (ref 0.0–0.1)
Basophils Relative: 1 %
Eosinophils Absolute: 0.3 10*3/uL (ref 0.0–0.5)
Eosinophils Relative: 3 %
Immature Granulocytes: 0 %
Lymphocytes Relative: 25 %
Lymphs Abs: 2.5 10*3/uL (ref 0.7–4.0)
Monocytes Absolute: 0.5 10*3/uL (ref 0.1–1.0)
Monocytes Relative: 6 %
Neutro Abs: 6.3 10*3/uL (ref 1.7–7.7)
Neutrophils Relative %: 65 %

## 2020-02-15 LAB — COMPREHENSIVE METABOLIC PANEL
ALT: 18 U/L (ref 0–44)
AST: 33 U/L (ref 15–41)
Albumin: 3.6 g/dL (ref 3.5–5.0)
Alkaline Phosphatase: 106 U/L (ref 38–126)
Anion gap: 12 (ref 5–15)
BUN: 9 mg/dL (ref 8–23)
CO2: 25 mmol/L (ref 22–32)
Calcium: 9.6 mg/dL (ref 8.9–10.3)
Chloride: 102 mmol/L (ref 98–111)
Creatinine, Ser: 1 mg/dL (ref 0.61–1.24)
GFR, Estimated: >60 mL/min (ref >60)
Glucose, Bld: 104 mg/dL — ABNORMAL HIGH (ref 70–99)
Potassium: 4.4 mmol/L (ref 3.5–5.1)
Sodium: 139 mmol/L (ref 135–145)
Total Bilirubin: 1.5 mg/dL — ABNORMAL HIGH (ref 0.3–1.2)
Total Protein: 6.7 g/dL (ref 6.5–8.1)

## 2020-02-15 LAB — SARS CORONAVIRUS 2 (TAT 6-24 HRS): SARS Coronavirus 2: NEGATIVE

## 2020-02-15 LAB — CBC
HCT: 55.2 % — ABNORMAL HIGH (ref 39.0–52.0)
Hemoglobin: 18.3 g/dL — ABNORMAL HIGH (ref 13.0–17.0)
MCH: 28.6 pg (ref 26.0–34.0)
MCHC: 33.2 g/dL (ref 30.0–36.0)
MCV: 86.4 fL (ref 80.0–100.0)
Platelets: 192 10*3/uL (ref 150–400)
RBC: 6.39 MIL/uL — ABNORMAL HIGH (ref 4.22–5.81)
RDW: 13.1 % (ref 11.5–15.5)
WBC: 9.7 10*3/uL (ref 4.0–10.5)
nRBC: 0 % (ref 0.0–0.2)

## 2020-02-15 LAB — I-STAT CHEM 8, ED
BUN: 10 mg/dL (ref 8–23)
Calcium, Ion: 1.05 mmol/L — ABNORMAL LOW (ref 1.15–1.40)
Chloride: 102 mmol/L (ref 98–111)
Creatinine, Ser: 0.9 mg/dL (ref 0.61–1.24)
Glucose, Bld: 104 mg/dL — ABNORMAL HIGH (ref 70–99)
HCT: 54 % — ABNORMAL HIGH (ref 39.0–52.0)
Hemoglobin: 18.4 g/dL — ABNORMAL HIGH (ref 13.0–17.0)
Potassium: 3.9 mmol/L (ref 3.5–5.1)
Sodium: 139 mmol/L (ref 135–145)
TCO2: 27 mmol/L (ref 22–32)

## 2020-02-15 LAB — CBG MONITORING, ED: Glucose-Capillary: 96 mg/dL (ref 70–99)

## 2020-02-15 LAB — HIV ANTIBODY (ROUTINE TESTING W REFLEX): HIV Screen 4th Generation wRfx: NONREACTIVE

## 2020-02-15 LAB — RAPID URINE DRUG SCREEN, HOSP PERFORMED
Amphetamines: NOT DETECTED
Barbiturates: NOT DETECTED
Benzodiazepines: NOT DETECTED
Cocaine: NOT DETECTED
Opiates: NOT DETECTED
Tetrahydrocannabinol: NOT DETECTED

## 2020-02-15 LAB — URINALYSIS, ROUTINE W REFLEX MICROSCOPIC
Bilirubin Urine: NEGATIVE
Glucose, UA: NEGATIVE mg/dL
Hgb urine dipstick: NEGATIVE
Ketones, ur: NEGATIVE mg/dL
Leukocytes,Ua: NEGATIVE
Nitrite: NEGATIVE
Protein, ur: NEGATIVE mg/dL
Specific Gravity, Urine: 1.042 — ABNORMAL HIGH (ref 1.005–1.030)
pH: 5 (ref 5.0–8.0)

## 2020-02-15 LAB — CK: Total CK: 62 U/L (ref 49–397)

## 2020-02-15 LAB — PROTIME-INR
INR: 0.9 (ref 0.8–1.2)
Prothrombin Time: 11.8 seconds (ref 11.4–15.2)

## 2020-02-15 LAB — LACTIC ACID, PLASMA: Lactic Acid, Venous: 1 mmol/L (ref 0.5–1.9)

## 2020-02-15 LAB — TSH: TSH: 0.919 u[IU]/mL (ref 0.350–4.500)

## 2020-02-15 LAB — APTT: aPTT: 28 seconds (ref 24–36)

## 2020-02-15 MED ORDER — TAMSULOSIN HCL 0.4 MG PO CAPS
0.4000 mg | ORAL_CAPSULE | Freq: Every day | ORAL | Status: DC
  Administered 2020-02-18 – 2020-02-24 (×7): 0.4 mg via ORAL
  Filled 2020-02-15 (×7): qty 1

## 2020-02-15 MED ORDER — ASPIRIN 81 MG PO CHEW
324.0000 mg | CHEWABLE_TABLET | Freq: Once | ORAL | Status: DC
  Filled 2020-02-15: qty 4

## 2020-02-15 MED ORDER — DIPHENHYDRAMINE HCL 50 MG/ML IJ SOLN
25.0000 mg | Freq: Once | INTRAMUSCULAR | Status: AC | PRN
  Administered 2020-02-15: 25 mg via INTRAVENOUS
  Filled 2020-02-15: qty 1

## 2020-02-15 MED ORDER — ALBUTEROL SULFATE HFA 108 (90 BASE) MCG/ACT IN AERS
1.0000 | INHALATION_SPRAY | RESPIRATORY_TRACT | Status: DC | PRN
  Filled 2020-02-15: qty 6.7

## 2020-02-15 MED ORDER — METHIMAZOLE 5 MG PO TABS
5.0000 mg | ORAL_TABLET | Freq: Every day | ORAL | Status: DC
  Administered 2020-02-18 – 2020-02-24 (×7): 5 mg via ORAL
  Filled 2020-02-15 (×11): qty 1

## 2020-02-15 MED ORDER — ASPIRIN 300 MG RE SUPP
300.0000 mg | Freq: Once | RECTAL | Status: AC
  Administered 2020-02-15: 300 mg via RECTAL
  Filled 2020-02-15: qty 1

## 2020-02-15 MED ORDER — SODIUM CHLORIDE 0.9 % IV SOLN: INTRAVENOUS | Status: DC

## 2020-02-15 MED ORDER — LACTATED RINGERS IV BOLUS
1000.0000 mL | Freq: Once | INTRAVENOUS | Status: AC
  Administered 2020-02-15: 1000 mL via INTRAVENOUS

## 2020-02-15 MED ORDER — ALBUTEROL SULFATE (2.5 MG/3ML) 0.083% IN NEBU
5.0000 mg | INHALATION_SOLUTION | Freq: Once | RESPIRATORY_TRACT | Status: AC
  Administered 2020-02-15: 5 mg via RESPIRATORY_TRACT
  Filled 2020-02-15: qty 6

## 2020-02-15 MED ORDER — BISACODYL 10 MG RE SUPP: 10.0000 mg | Freq: Every day | RECTAL | Status: DC | PRN

## 2020-02-15 MED ORDER — ASPIRIN EC 81 MG PO TBEC: 81.0000 mg | DELAYED_RELEASE_TABLET | Freq: Every day | ORAL | Status: DC

## 2020-02-15 MED ORDER — ATORVASTATIN CALCIUM 80 MG PO TABS
80.0000 mg | ORAL_TABLET | Freq: Every day | ORAL | Status: DC
  Administered 2020-02-18 – 2020-02-24 (×7): 80 mg via ORAL
  Filled 2020-02-15 (×7): qty 1

## 2020-02-15 MED ORDER — SENNOSIDES-DOCUSATE SODIUM 8.6-50 MG PO TABS: 1.0000 | ORAL_TABLET | Freq: Every evening | ORAL | Status: DC | PRN

## 2020-02-15 MED ORDER — ACETAMINOPHEN 160 MG/5ML PO SOLN: 650.0000 mg | ORAL | Status: DC | PRN

## 2020-02-15 MED ORDER — STROKE: EARLY STAGES OF RECOVERY BOOK
Freq: Once | Status: DC
  Filled 2020-02-15: qty 1

## 2020-02-15 MED ORDER — ACETAMINOPHEN 650 MG RE SUPP: 650.0000 mg | RECTAL | Status: DC | PRN

## 2020-02-15 MED ORDER — NICOTINE 14 MG/24HR TD PT24
14.0000 mg | MEDICATED_PATCH | Freq: Every day | TRANSDERMAL | Status: DC
  Administered 2020-02-15: 14 mg via TRANSDERMAL
  Filled 2020-02-15: qty 1

## 2020-02-15 MED ORDER — ACETAMINOPHEN 325 MG PO TABS
650.0000 mg | ORAL_TABLET | ORAL | Status: DC | PRN
  Administered 2020-02-22 – 2020-02-23 (×2): 650 mg via ORAL
  Filled 2020-02-15 (×2): qty 2

## 2020-02-15 MED ORDER — SODIUM CHLORIDE 0.9% FLUSH
3.0000 mL | Freq: Once | INTRAVENOUS | Status: AC
  Administered 2020-02-15: 3 mL via INTRAVENOUS

## 2020-02-15 MED ORDER — CLOPIDOGREL BISULFATE 75 MG PO TABS
75.0000 mg | ORAL_TABLET | Freq: Every day | ORAL | Status: DC
  Administered 2020-02-18 – 2020-02-24 (×7): 75 mg via ORAL
  Filled 2020-02-15 (×7): qty 1

## 2020-02-15 MED ORDER — IOHEXOL 350 MG/ML SOLN
75.0000 mL | Freq: Once | INTRAVENOUS | Status: AC | PRN
  Administered 2020-02-15: 75 mL via INTRAVENOUS

## 2020-02-15 NOTE — Consult Note (Signed)
Neurology Code Stroke Consult  CC: CODE STROKE  History is obtained from: EMS  HPI: David Hendricks is a 70 y.o. left handed male with a PMHx of HLD, CAD with hx MI, COPD, depression, tobacco abuse, GERD, nephrolithiasis, hyperthyroidism, and CKD. Patient presents via EMS as a code stroke. Per EMS, patient went to bed at 0100 hours and woke up around noon. He woke up with slurred speech and left arm weakness. Facial droop noted by EMS on arrival. In transport, his left arm weakness improved.   Upon arrival, NIH score with 1 for droop and 1 for dysarthria. He has a wet cough on arrival. Denies [place] of ETOH today. Patient taken emergently to CT.   In review of chart, patient has a hx of CAD s/p MI at some point. HLD on Lipitor. Takes a baby ASA qd. No other antiplatelets or anticoagulants.   No hx of personal of familial stroke. Smokes 2 PPD since 1963.   CBG 150 in field  LKW: 0100 tpa given?: No, outside of window IR Thrombectomy? No, no LVO MRS 0  NIHSS:  1a Level of Conscious.: 0 1b LOC Questions: 0 1c LOC Commands: 0 2 Best Gaze: 0 3 Visual: 0 4 Facial Palsy: 1 5a Motor Arm - left: 0 5b Motor Arm - Right: 0 6a Motor Leg - Left: 0 6b Motor Leg - Right: 0 7 Limb Ataxia: 0 8 Sensory: 0 9 Best Language: 0 10 Dysarthria: 1 11 Extinct. and Inatten.: 0 TOTAL: 2  ROS: He does say that he has a cough and congestion. Unable to assess entire ROS due to emergent nature of event.   Past Medical History:  Diagnosis Date  . Allergy   . Anxiety   . Aortic atherosclerosis ([place])   . Arthritis   . Benign prostatic hyperplasia (BPH) with urinary urgency   . Chronic kidney disease    KIDNEY STONES  . COPD (chronic obstructive pulmonary disease) ([place])    possible   . Coronary artery disease    10/18 PCI/DES to pLAD, mild nonobstructive disease in the Lcx/RCA. Normal EF.   Marland Kitchen Depression   . Dyslipidemia   . Dysrhythmia 2016   irregular heartbeat  . Gallstones   . GERD  (gastroesophageal reflux disease)   . History of kidney stones   . Hyperlipidemia   . Hyperthyroidism   . Leg cramps   . Multiple pulmonary nodules   . Myocardial infarction ([place])    2018  . Pneumonia   . Seasonal allergies   . Thyroid nodule   . Vertigo   . Vitamin B 12 deficiency      Family History  Problem Relation Age of Onset  . Alcohol abuse Father        called [place] per pt  . Cancer Father        OAK CELL CANCER PER PT NON SURGICAL   . Colon cancer Neg Hx   . Colon polyps Neg Hx   . Esophageal cancer Neg Hx   . Rectal cancer Neg Hx   . Stomach cancer Neg Hx     Social History: Smokes 2 PPD x 59 yrs, 118 pack yr hx. No ETOH or illicit drugs.  Prior to Admission medications   Medication Sig Start Date End Date Taking? Authorizing Provider  albuterol (VENTOLIN HFA) 108 (90 Base) MCG/ACT inhaler INHALE 2 PUFFS INTO THE LUNGS EVERY 6 HOURS AS NEEDED 10/30/19   Spero Geralds, MD  aspirin EC 81  MG tablet Take 81 mg by mouth daily.    [provider]  atorvastatin (LIPITOR) 80 MG tablet Take 1 tablet (80 mg total) by mouth daily. 05/08/19   [name], MD  budesonide-formoterol [facility]) 80-4.5 MCG/ACT inhaler Inhale 2 puffs into the lungs 2 (two) times daily. 06/02/19   Horald Pollen, MD  methimazole (TAPAZOLE) 5 MG tablet TAKE 1 TABLET(5 MG) BY MOUTH DAILY 09/12/19   Shamleffer, Melanie Crazier, MD  naproxen sodium (ALEVE) 220 MG tablet Take 220 mg by mouth daily as needed (mild pain).    [provider]  ondansetron (ZOFRAN) 4 MG tablet Take 1 tablet (4 mg total) by mouth every 6 (six) hours as needed for nausea or vomiting. 06/12/19   Orpah Greek, MD  oxyCODONE-acetaminophen (PERCOCET) 5-325 MG tablet Take 1 tablet by mouth every 4 (four) hours as needed. Patient not taking: Reported on 10/13/2019 06/12/19   Orpah Greek, MD  pantoprazole (PROTONIX) 40 MG tablet TAKE 1 TABLET BY MOUTH EVERY DAY Patient  taking differently: Take 40 mg by mouth daily.  05/21/19   Libby Maw, MD  phenazopyridine (PYRIDIUM) 100 MG tablet Take 1 tablet (100 mg total) by mouth 3 (three) times daily as needed for pain. 06/17/19 06/16/20  Robley Fries, MD  tamsulosin (FLOMAX) 0.4 MG CAPS capsule Take 0.4 mg by mouth daily.     [provider]  traMADol Veatrice Bourbon) 50 MG tablet Take by mouth. 09/29/19   [provider]  traZODone (DESYREL) 50 MG tablet Take 50 mg by mouth at bedtime. 08/19/19   [provider]    Exam: Current vital signs: BP 140/90 HR 90s in field.   Physical Exam  Constitutional: Appears well-developed and well-nourished. Obese. Wreaks of cigarette smoke.  Psych: Affect appropriate to situation Eyes: No scleral injection HENT: No OP obstrucion Head: Normocephalic.  Cardiovascular: RRR Respiratory: cough, wet. Normal respiratory effort.  GI: Soft.  Skin: WDI  Neuro: Mental Status: Patient is awake, alert, oriented to person, place, month, year, and situation. Patient is able to give a clear and coherent history. No signs of aphasia or neglect. Speech/Language: Speech is intact and fluent. Moderate dysarthria-speech is slurred but intelligible. Comprehension, repetition, and naming intact.  Cranial Nerves: II: Visual Fields are full. Pupils are equal, round, and reactive to light. III,IV, VI: EOMI without ptosis or diploplia.  V: Facial sensation is symmetric to light touch. Able to move jaw back and forth.  VII: left facial droop, lower face. Raises eyebrows symmetrically.   VIII: hearing is intact to voice IX, X: Uvula elevates symmetrically. Phonation normal.  XI: Shoulder shrug is symmetric. XII: tongue is midline without atrophy or fasciculations.  Motor: Tone is normal. Bulk is normal. 5/5 strength RUE, BLE. 4/5 LUE.  Sensory: Sensation is symmetric to light touch in the arms and legs. Extinction intact.  Deep Tendon Reflexes: 2+ UEs. 0  patella. Plantars: Toes are downgoing bilaterally. Cerebellar: FNF intact bilaterally. No drift UE/LE.  Gait: deferred.   I have reviewed labs in epic and the pertinent results are:  INR   0.9        LDL  38 in 2018    TSH  0.26 2021         HbA1c 5.5 2020  Dr. Theda Sers reviewed the images obtained:  NCT head IMPRESSION: 1. No evidence of acute large vascular territory infarct. ASPECTS is 10. 2. Moderate to advanced patchy white matter hypoattenuation appears similar to prior and  presumably related to chronic microvascular ischemic disease. A small white matter infarct could be obscured and MRI could better evaluate for acute ischemia if clinically indicated.  CTA head and neck  IMPRESSION: 1. 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 2. Otherwise, no large vessel occlusion or proximal hemodynamically significant stenosis in the head or neck 3. Diminutive vertebrobasilar system intracranially with bilateral fetal type PCAs, anatomic variant. 4. Enlarged, heterogeneous thyroid with numerous nodules and retrosternal extension. This may reflect goiter; however, recommend thyroid ultrasound to further evaluate. 5. Paraseptal and centrilobular emphysema.  Assessment: Gerold Sar is a 70 y.o. male PMHx significant for vascular risk factors-tobacco abuse, CAD, hx MI, and HLD. Presented with left facial droop, dysarthria, and left UE weakness. CT head did not show acute large territory stroke and CTA head and neck did not show LVO however, the non dominant right VA is diminutive and possibly occluded intradurally and cannot exclude brainstem ischemic stroke. The dominant VA is robust, not hypoplastic or stenotic which is typically managed conservatively. His exam improved in route and he was producing copious thick secretions and there is concern for his respiratory status, so MRI brain not done at this time. Hopefully, his  respiratory status will improve and MRI can be accomplished tonight. There is also an incidental finding of thyroid mass, likely goiter, given patient's hx of hyperthyroidism. This was communicated to ED MD.    Impression:  1. CVA 2. Thyroid mass  Plan: - Medicine admit - MRI brain without contrast. - Recommend TTE. - Recommend labs: HbA1c, lipid panel, TSH. - Per chart, he is taking Lipitor 80mg .  - Aspirin 325mg  now (ordered) then 81mg  daily. - Clopidogrel 75mg  daily for 3 weeks. - SBP goal - Permissive hypertension first 24 h < 220/110. Hold home medications for now. - Maintain MAP >65. - Telemetry monitoring for arrhythmia. - Recommend bedside Swallow screen. - Recommend Stroke education. - Recommend PT/OT/SLP consult. - Recommend metabolic/infectious workup with CBC, CMP, UA with UCx, CXR, CK, serum lactate. - Respiratory issues and neck mass per ED/medicine team.   This patient is critically ill and at significant risk of neurological worsening, death and care requires constant monitoring of vital signs, hemodynamics,respiratory and cardiac monitoring, neurological assessment, discussion with family, other specialists and medical decision making of high complexity. I spent 45 minutes of neurocritical care time  in the care of  this patient. This was time spent independent of any time provided by nurse practitioner or PA.   Lynnae Sandhoff, MD Page: [phone]

## 2020-02-15 NOTE — ED Provider Notes (Signed)
[facility] EMERGENCY DEPARTMENT Provider Note   CSN: [id number] Arrival date & time: 02/15/20  1319     History Chief Complaint  Patient presents with  . Code Stroke    David Hendricks is a 70 y.o. male.  Patient is a 70 year old male with a history of COPD, CAD status post stents, hypertension, hypothyroidism, CKD, BPH, ongoing tobacco use who is presenting today as a code stroke.  Patient reports he was last normal at 1 AM when he went to bed last night he woke up around noon and noticed he had weakness on his left side.  Paramedics noted aphasia, left-sided facial droop and weakness in the left upper and lower extremity because of presenting with LVO symptoms within the last 24 hours patient was made a code stroke.  He reports that he has had a cough and some shortness of breath for a while but cannot give any other timeframe.  He lives alone with his dog but has been independent.  He denies recent fever.  The history is provided by the patient and the EMS personnel.       Past Medical History:  Diagnosis Date  . Allergy   . Anxiety   . Aortic atherosclerosis ([place])   . Arthritis   . Benign prostatic hyperplasia (BPH) with urinary urgency   . Chronic kidney disease    KIDNEY STONES  . COPD (chronic obstructive pulmonary disease) ([place])    possible   . Coronary artery disease    10/18 PCI/DES to pLAD, mild nonobstructive disease in the Lcx/RCA. Normal EF.   Marland Kitchen Depression   . Dyslipidemia   . Dysrhythmia 2016   irregular heartbeat  . Gallstones   . GERD (gastroesophageal reflux disease)   . History of kidney stones   . Hyperlipidemia   . Hyperthyroidism   . Leg cramps   . Multiple pulmonary nodules   . Myocardial infarction ([place])    2018  . Pneumonia   . Seasonal allergies   . Thyroid nodule   . Vertigo   . Vitamin B 12 deficiency     Patient Active Problem List   Diagnosis Date Noted  . Pulmonary emphysema ([place]) 06/02/2019  . Aortic  atherosclerosis ([place]) 06/02/2019  . Smokers' cough ([place]) 06/02/2019  . 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  . Multinodular goiter 12/24/2018  . Subclinical hyperthyroidism 12/24/2018  . BPH (benign prostatic hyperplasia) 11/20/2018  . Pulmonary nodules 11/15/2018  . Esophageal thickening 11/15/2018  . Cigarette nicotine dependence without complication [id number]  . Old MI (myocardial infarction) 01/30/2017  . CAD (coronary artery disease) 01/30/2017  . Hyperlipidemia 10/10/2016  . History of non-ST elevation myocardial infarction (NSTEMI) 10/09/2016  . Tobacco abuse 08/28/2016  . Difficulty sleeping 04/22/2016  . Benign prostatic hyperplasia with urinary frequency 02/11/2016    Past Surgical History:  Procedure Laterality Date  . COLONOSCOPY    . CORONARY ANGIOPLASTY  10/09/2016  . CORONARY STENT INTERVENTION N/A 10/09/2016   Procedure: CORONARY STENT INTERVENTION;  Surgeon: Nelva Bush, MD;  Location: [facility];  Service: Cardiovascular;  Laterality: N/A;  . CYSTOSCOPY WITH RETROGRADE PYELOGRAM, URETEROSCOPY AND STENT PLACEMENT Left 06/17/2019   Procedure: CYSTOSCOPY WITH RETROGRADE PYELOGRAM, URETEROSCOPY AND STENT PLACEMENT;  Surgeon: Robley Fries, MD;  Location: [facility];  Service: Urology;  Laterality: Left;  1 HR  . HOLMIUM LASER APPLICATION Left [id number]   Procedure: HOLMIUM LASER APPLICATION;  Surgeon: Robley Fries, MD;  Location: [facility];  Service: Urology;  Laterality:  Left;  . INTRAVASCULAR ULTRASOUND/IVUS N/A 10/09/2016   Procedure: Intravascular Ultrasound/IVUS;  Surgeon: Nelva Bush, MD;  Location: [facility];  Service: Cardiovascular;  Laterality: N/A;  . IR CHOLANGIOGRAM EXISTING TUBE  11/18/2018  . IR PERC CHOLECYSTOSTOMY  11/16/2018  . LEFT HEART CATH AND CORONARY ANGIOGRAPHY N/A 10/09/2016   Procedure: LEFT HEART CATH AND CORONARY ANGIOGRAPHY;  Surgeon: Nelva Bush, MD;  Location: [facility];  Service: Cardiovascular;   Laterality: N/A;       Family History  Problem Relation Age of Onset  . Alcohol abuse Father        called [facility] per pt  . Cancer Father        OAK CELL CANCER PER PT NON SURGICAL   . Colon cancer Neg Hx   . Colon polyps Neg Hx   . Esophageal cancer Neg Hx   . Rectal cancer Neg Hx   . Stomach cancer Neg Hx     Social History   Tobacco Use  . Smoking status: Current Every Day Smoker    Packs/day: 2.00    Years: 45.00    Pack years: 90.00    Types: Cigarettes    Start date: 01/09/1961  . Smokeless tobacco: Never Used  . Tobacco comment: tobacco info given  Vaping Use  . Vaping Use: Never used  Substance Use Topics  . Alcohol use: No  . Drug use: No    Home Medications Prior to Admission medications   Medication Sig Start Date End Date Taking? Authorizing Provider  albuterol (VENTOLIN HFA) 108 (90 Base) MCG/ACT inhaler INHALE 2 PUFFS INTO THE LUNGS EVERY 6 HOURS AS NEEDED 10/30/19   Spero Geralds, MD  aspirin EC 81 MG tablet Take 81 mg by mouth daily.    [provider]  atorvastatin (LIPITOR) 80 MG tablet Take 1 tablet (80 mg total) by mouth daily. 05/08/19   [name], MD  budesonide-formoterol [facility]) 80-4.5 MCG/ACT inhaler Inhale 2 puffs into the lungs 2 (two) times daily. 06/02/19   Horald Pollen, MD  methimazole (TAPAZOLE) 5 MG tablet TAKE 1 TABLET(5 MG) BY MOUTH DAILY 09/12/19   Shamleffer, Melanie Crazier, MD  naproxen sodium (ALEVE) 220 MG tablet Take 220 mg by mouth daily as needed (mild pain).    [provider]  ondansetron (ZOFRAN) 4 MG tablet Take 1 tablet (4 mg total) by mouth every 6 (six) hours as needed for nausea or vomiting. 06/12/19   Orpah Greek, MD  oxyCODONE-acetaminophen (PERCOCET) 5-325 MG tablet Take 1 tablet by mouth every 4 (four) hours as needed. Patient not taking: Reported on 10/13/2019 06/12/19   Orpah Greek, MD  pantoprazole (PROTONIX) 40 MG tablet TAKE 1 TABLET BY MOUTH  EVERY DAY Patient taking differently: Take 40 mg by mouth daily.  05/21/19   Libby Maw, MD  phenazopyridine (PYRIDIUM) 100 MG tablet Take 1 tablet (100 mg total) by mouth 3 (three) times daily as needed for pain. 06/17/19 06/16/20  Robley Fries, MD  tamsulosin (FLOMAX) 0.4 MG CAPS capsule Take 0.4 mg by mouth daily.     [provider]  traMADol Veatrice Bourbon) 50 MG tablet Take by mouth. 09/29/19   [provider]  traZODone (DESYREL) 50 MG tablet Take 50 mg by mouth at bedtime. 08/19/19   [provider]    Allergies    Patient has no known allergies.  Review of Systems   Review of Systems  All other systems  reviewed and are negative.   Physical Exam Updated Vital Signs BP 129/72   Pulse 73   Temp 97.9 F (36.6 C) (Oral)   Resp (!) 23   Ht 6\' 1"  (1.854 m)   Wt 99.8 kg   SpO2 90%   BMI 29.03 kg/m   Physical Exam Vitals and nursing note reviewed.  Constitutional:      General: He is not in acute distress.    Appearance: Normal appearance. He is well-developed, normal weight and well-nourished.  HENT:     Head: Normocephalic and atraumatic.     Mouth/Throat:     Mouth: Oropharynx is clear and moist.  Eyes:     Extraocular Movements: EOM normal.     Conjunctiva/sclera: Conjunctivae normal.     Pupils: Pupils are equal, round, and reactive to light.  Neck:     Comments: No tracheal deviation Cardiovascular:     Rate and Rhythm: Normal rate and regular rhythm.     Pulses: Intact distal pulses.     Heart sounds: No murmur heard.   Pulmonary:     Effort: Pulmonary effort is normal. No respiratory distress.     Breath sounds: Normal breath sounds. No wheezing or rales.     Comments: Decreased breath sounds.  Coarse cough Abdominal:     General: There is no distension.     Palpations: Abdomen is soft.     Tenderness: There is no abdominal tenderness. There is no guarding or rebound.  Musculoskeletal:        General: No tenderness or  edema. Normal range of motion.     Cervical back: Normal range of motion and neck supple.     Right lower leg: No edema.     Left lower leg: No edema.  Skin:    General: Skin is warm and dry.     Findings: No erythema or rash.  Neurological:     Mental Status: He is alert and oriented to person, place, and time.     Sensory: Sensory deficit present.     Motor: Weakness present.     Comments: Left side facial droop.  4/5 strength in the left upper/lower.  Slurred speech with mild expressive aphasia  Psychiatric:        Mood and Affect: Mood and affect and mood normal.        Behavior: Behavior normal.     ED Results / Procedures / Treatments   Labs (all labs ordered are listed, but only abnormal results are displayed) Labs Reviewed  CBC - Abnormal; Notable for the following components:      Result Value   RBC 6.39 (*)    Hemoglobin 18.3 (*)    HCT 55.2 (*)    All other components within normal limits  COMPREHENSIVE METABOLIC PANEL - Abnormal; Notable for the following components:   Glucose, Bld 104 (*)    Total Bilirubin 1.5 (*)    All other components within normal limits  I-STAT CHEM 8, ED - Abnormal; Notable for the following components:   Glucose, Bld 104 (*)    Calcium, Ion 1.05 (*)    Hemoglobin 18.4 (*)    HCT 54.0 (*)    All other components within normal limits  SARS CORONAVIRUS 2 (TAT 6-24 HRS)  PROTIME-INR  APTT  DIFFERENTIAL  TSH  CBG MONITORING, ED    EKG None  Radiology CT HEAD CODE STROKE WO CONTRAST  Result Date: 02/15/2020 CLINICAL DATA:  Code stroke. Left-sided weakness. Facial  droop and slurred speech. EXAM: CT HEAD WITHOUT CONTRAST TECHNIQUE: Contiguous axial images were obtained from the base of the skull through the vertex without intravenous contrast. COMPARISON:  December 31, 2019. FINDINGS: Brain: No evidence of acute large vascular territory infarct. There is moderate to advanced patchy white matter hypoattenuation, nonspecific but most  likely related to chronic microvascular ischemic disease. No acute hemorrhage. Moderate diffuse cerebral atrophy with ex vacuo ventricular dilation. No mass lesion or abnormal mass effect. No extra-axial fluid collection. Vascular: No definite hyperdense vessel. Mild diffuse hyperdensity of the dural sinuses and vessels appears similar to prior. Skull: No acute fracture. Sinuses/Orbits: Sinuses are largely clear. Other: No mastoid effusions. ASPECTS [facility] Stroke Program Early CT Score) Total score (0-10 with 10 being normal): 10 IMPRESSION: 1. No evidence of acute large vascular territory infarct.ASPECTS is 10. 2. Moderate to advanced patchy white matter hypoattenuation appears similar to prior and presumably related to chronic microvascular ischemic disease. A small white matter infarct could be obscured and MRI could better evaluate for acute ischemia if clinically indicated. Code stroke imaging results were communicated on 02/15/2020 at 1:45 pm to provider Dr. Theda Sers Via telephone, who verbally acknowledged these results. Electronically Signed   By: Margaretha Sheffield MD   On: 02/15/2020 13:48    Procedures Procedures   Medications Ordered in ED Medications  sodium chloride flush (NS) 0.9 % injection 3 mL (has no administration in time range)  aspirin chewable tablet 324 mg (has no administration in time range)  iohexol (OMNIPAQUE) 350 MG/ML injection 75 mL (75 mLs Intravenous Contrast Given 02/15/20 1342)    ED Course  I have reviewed the triage vital signs and the nursing notes.  Pertinent labs & imaging results that were available during my care of the patient were reviewed by me and considered in my medical decision making (see chart for details).    MDM Rules/Calculators/A&P                          Elderly male presenting today as a code stroke.  Last normal at 1 AM when he went to bed last night woke up at noon today with left-sided upper and lower extremity weakness, facial droop and  some mild expressive aphasia and slurred speech.  Patient also has a cough and appears to possibly have some mild swallowing difficulty.  He is otherwise awake and alert.  Made a code stroke because it is within 24 hours and he had LVO symptoms.  Head CT was negative for acute LVO.  He does have evidence of prior strokes.  I-STAT Chem-8 without acute findings, CBC with hemoconcentration with a hemoglobin of 18 and normal platelet count.  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  He also has an enlarged heterogeneous thyroid with numerous nodules and retrosternal extension which may just reflect a goiter but they did recommend an ultrasound.  Also evidence of emphysema.  Patient is not a TPA candidate at this time given he is LVO negative by CT and last seen normal is greater than 3 to 4 hours.  Patient will need admission for stroke work-up.  He did fail his swallow screen and oral meds were held.  Blood pressure has been normal and they recommended an IV bolus.  We will also give him a dose of albuterol as he does  have diminished breath sounds.  Chest x-ray pending.  EKG without acute findings.  MDM Number of Diagnoses or Management Options   Amount and/or Complexity of Data Reviewed Clinical lab tests: ordered and reviewed Tests in the radiology section of CPT: ordered and reviewed Tests in the medicine section of CPT: ordered and reviewed Decide to obtain previous medical records or to obtain history from someone other than the patient: yes Obtain history from someone other than the patient: yes Review and summarize past medical records: yes Discuss the patient with other providers: yes Independent visualization of images, tracings, or specimens: yes  Risk of Complications, Morbidity, and/or Mortality Presenting problems: moderate Diagnostic  procedures: moderate Management options: moderate  Patient Progress Patient progress: stable   Final Clinical Impression(s) / ED Diagnoses Final diagnoses:  Cerebrovascular accident (CVA), unspecified mechanism ([place])  Thyroid enlargement    Rx / DC Orders ED Discharge Orders    None       Blanchie Dessert, MD 02/15/20 1458

## 2020-02-15 NOTE — ED Notes (Signed)
Pt going to mri 

## 2020-02-15 NOTE — H&P (Addendum)
History and Physical    David Hendricks [id number] DOB: 09-30-1950 DOA: 02/15/2020  PCP: Horald Pollen, MD Patient coming from: home  Chief Complaint: slurred speech and left sided weakness  HPI: David Hendricks is a 70 y.o. male with medical history significant of tobacco abuse 2 pack a day, COPD, CKD, CAD, HLD, hyperthyroidism, thyroid nodule, BPH, anxiety and depression, who presented with slurred speech and left-sided weakness.  Patient lives home with his dog.David Hendricks  He reports that he was last normal at 1 AM when he went to bed last night.  Patient woke up around 11 AM today and noticed that he had slurred speech and left-sided weakness.  He called the EMS who noted aphasia, left-sided facial droop and left upper and lower extremity weakness.  Patient was brought to the ED for code stroke.  Of note, patient reports nonproductive cough for 2 weeks but cannot give other details.  Denies fevers, chills, shortness of breath, wheezing, chest pain, chest pressure, nausea, vomiting, diarrhea, abdominal pain, dysuria or urinary frequency or urgency.  In the emergency room, he was afebrile with pulse 73, RR 20, BP 124/56 and room air O2 sat 94%.  The labs showed nonrevealing CBC and BMP, glucose 96, normal TSH, Covid pending. EKG showed no ischemic change. CXR showed no active disease. Head CT and CTA head and neck showed no evidence of acute infarct. 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 Enlarged, heterogeneous thyroid.  Neurology was consulted.   Review of Systems: As per HPI otherwise 10 point review of systems negative.  Review of Systems Otherwise negative except as per HPI, including: General: Denies fever, chills, night sweats or unintended weight loss. Resp: Denies cough, wheezing, shortness of breath. Cardiac: Denies chest pain, palpitations, orthopnea, paroxysmal nocturnal dyspnea. GI: Denies abdominal pain, nausea,  vomiting, diarrhea or constipation GU: Denies dysuria, frequency, hesitancy or incontinence MS: Denies muscle aches, joint pain or swelling Neuro: Denies headache.  Positive for slurred speech, left facial droop, and left-sided weakness. Psych: Denies anxiety, depression, SI/HI/AVH Skin: Denies new rashes or lesions ID: Denies sick contacts, exotic exposures, travel  Past Medical History:  Diagnosis Date  . Allergy   . Anxiety   . Aortic atherosclerosis ([place])   . Arthritis   . Benign prostatic hyperplasia (BPH) with urinary urgency   . Chronic kidney disease    KIDNEY STONES  . COPD (chronic obstructive pulmonary disease) ([place])    possible   . Coronary artery disease    10/18 PCI/DES to pLAD, mild nonobstructive disease in the Lcx/RCA. Normal EF.   David Hendricks Depression   . Dyslipidemia   . Dysrhythmia 2016   irregular heartbeat  . Gallstones   . GERD (gastroesophageal reflux disease)   . History of kidney stones   . Hyperlipidemia   . Hyperthyroidism   . Leg cramps   . Multiple pulmonary nodules   . Myocardial infarction ([place])    2018  . Pneumonia   . Seasonal allergies   . Thyroid nodule   . Vertigo   . Vitamin B 12 deficiency     Past Surgical History:  Procedure Laterality Date  . COLONOSCOPY    . CORONARY ANGIOPLASTY  10/09/2016  . CORONARY STENT INTERVENTION N/A 10/09/2016   Procedure: CORONARY STENT INTERVENTION;  Surgeon: Nelva Bush, MD;  Location: [facility];  Service: Cardiovascular;  Laterality: N/A;  . CYSTOSCOPY WITH RETROGRADE PYELOGRAM, URETEROSCOPY AND STENT PLACEMENT Left 06/17/2019   Procedure:  CYSTOSCOPY WITH RETROGRADE PYELOGRAM, URETEROSCOPY AND STENT PLACEMENT;  Surgeon: Robley Fries, MD;  Location: [facility];  Service: Urology;  Laterality: Left;  1 HR  . HOLMIUM LASER APPLICATION Left 04/10/6832   Procedure: HOLMIUM LASER APPLICATION;  Surgeon: Robley Fries, MD;  Location: [facility];  Service: Urology;  Laterality: Left;  . INTRAVASCULAR  ULTRASOUND/IVUS N/A 10/09/2016   Procedure: Intravascular Ultrasound/IVUS;  Surgeon: Nelva Bush, MD;  Location: [facility];  Service: Cardiovascular;  Laterality: N/A;  . IR CHOLANGIOGRAM EXISTING TUBE  11/18/2018  . IR PERC CHOLECYSTOSTOMY  11/16/2018  . LEFT HEART CATH AND CORONARY ANGIOGRAPHY N/A 10/09/2016   Procedure: LEFT HEART CATH AND CORONARY ANGIOGRAPHY;  Surgeon: Nelva Bush, MD;  Location: [facility];  Service: Cardiovascular;  Laterality: N/A;    SOCIAL HISTORY:  reports that he has been smoking cigarettes. He started smoking about 59 years ago. He has a 90.00 pack-year smoking history. He has never used smokeless tobacco. He reports that he does not drink alcohol and does not use drugs.  No Known Allergies  FAMILY HISTORY: Family History  Problem Relation Age of Onset  . Alcohol abuse Father        called [facility] per pt  . Cancer Father        OAK CELL CANCER PER PT NON SURGICAL   . Colon cancer Neg Hx   . Colon polyps Neg Hx   . Esophageal cancer Neg Hx   . Rectal cancer Neg Hx   . Stomach cancer Neg Hx      Prior to Admission medications   Medication Sig Start Date End Date Taking? Authorizing Provider  albuterol (VENTOLIN HFA) 108 (90 Base) MCG/ACT inhaler INHALE 2 PUFFS INTO THE LUNGS EVERY 6 HOURS AS NEEDED 10/30/19   Spero Geralds, MD  aspirin EC 81 MG tablet Take 81 mg by mouth daily.   Yes [provider]  atorvastatin (LIPITOR) 80 MG tablet Take 1 tablet (80 mg total) by mouth daily. 05/08/19  Yes [name], MD  oxyCODONE-acetaminophen (PERCOCET) 5-325 MG tablet Take 1 tablet by mouth every 4 (four) hours as needed. Patient taking differently: Take 1-1.5 tablets by mouth every 4 (four) hours as needed for moderate pain. 06/12/19  Yes Pollina, Gwenyth Allegra, MD  budesonide-formoterol (SYMBICORT) 80-4.5 MCG/ACT inhaler Inhale 2 puffs into the lungs 2 (two) times daily. Patient not taking: Reported on 02/15/2020  06/02/19   Horald Pollen, MD  methimazole (TAPAZOLE) 5 MG tablet TAKE 1 TABLET(5 MG) BY MOUTH DAILY 09/12/19   Shamleffer, Melanie Crazier, MD  naproxen sodium (ALEVE) 220 MG tablet Take 220 mg by mouth daily as needed (mild pain).    [provider]  ondansetron (ZOFRAN) 4 MG tablet Take 1 tablet (4 mg total) by mouth every 6 (six) hours as needed for nausea or vomiting. 06/12/19   Orpah Greek, MD  pantoprazole (PROTONIX) 40 MG tablet TAKE 1 TABLET BY MOUTH EVERY DAY Patient taking differently: Take 40 mg by mouth daily.  05/21/19   Libby Maw, MD  phenazopyridine (PYRIDIUM) 100 MG tablet Take 1 tablet (100 mg total) by mouth 3 (three) times daily as needed for pain. 06/17/19 06/16/20  Robley Fries, MD  tamsulosin (FLOMAX) 0.4 MG CAPS capsule Take 0.4 mg by mouth daily.     [provider]  traMADol Veatrice Bourbon) 50 MG tablet Take by mouth. 09/29/19   [provider]  traZODone (DESYREL) 50 MG tablet  Take 50 mg by mouth at bedtime. 08/19/19   [provider]    Physical Exam: Vitals:   02/15/20 1430 02/15/20 1545 02/15/20 1600 02/15/20 1615  BP: 129/72 123/71 108/78 134/72  Pulse: 73 74 85 76  Resp: (!) 23 17 15 20   Temp:      TempSrc:      SpO2: 90% 96% 98% 96%  Weight:      Height:          Constitutional: NAD, calm, comfortable.  Acute ill-appearing. Eyes: PERRL, lids and conjunctivae normal ENMT: Mucous membranes are moist. Posterior pharynx clear of any exudate or lesions.Normal dentition.  Neck: normal, supple, no masses, no thyromegaly Respiratory: clear to auscultation bilaterally, no wheezing, no crackles. Normal respiratory effort. No accessory muscle use.  Cardiovascular: Regular rate and rhythm, no murmurs / rubs / gallops. No extremity edema. 2+ pedal pulses. No carotid bruits.  Abdomen: no tenderness, no masses palpated. No hepatosplenomegaly. Bowel sounds positive.  Musculoskeletal: no clubbing / cyanosis. No  joint deformity upper and lower extremities. Good ROM, no contractures. Normal muscle tone.  Skin: no rashes, lesions, ulcers. No induration Neurologic: CN 2-12 grossly intact except for left facial droop. Sensation intact, DTR normal. Strength 5/5 in RUE and RLE. 3/5 LUE and 4/5 LLE.  Psychiatric: Normal judgment and insight. Alert and oriented x 3. Normal mood.     Labs on Admission: I have personally reviewed following labs and imaging studies  CBC: Recent Labs  Lab 02/15/20 1325 02/15/20 1333  WBC 9.7  --   NEUTROABS 6.3  --   HGB 18.3* 18.4*  HCT 55.2* 54.0*  MCV 86.4  --   PLT 192  --    Basic Metabolic Panel: Recent Labs  Lab 02/15/20 1325 02/15/20 1333  Arshiya Jakes 139 139  K 4.4 3.9  CL 102 102  CO2 25  --   GLUCOSE 104* 104*  BUN 9 10  CREATININE 1.00 0.90  CALCIUM 9.6  --    GFR: Estimated Creatinine Clearance: 95 mL/min (by C-G formula based on SCr of 0.9 mg/dL). Liver Function Tests: Recent Labs  Lab 02/15/20 1325  AST 33  ALT 18  ALKPHOS 106  BILITOT 1.5*  PROT 6.7  ALBUMIN 3.6   No results for input(s): LIPASE, AMYLASE in the last 168 hours. No results for input(s): AMMONIA in the last 168 hours. Coagulation Profile: Recent Labs  Lab 02/15/20 1325  INR 0.9   Cardiac Enzymes: No results for input(s): CKTOTAL, CKMB, CKMBINDEX, TROPONINI in the last 168 hours. BNP (last 3 results) No results for input(s): PROBNP in the last 8760 hours. HbA1C: No results for input(s): HGBA1C in the last 72 hours. CBG: Recent Labs  Lab 02/15/20 1408  GLUCAP 96   Lipid Profile: No results for input(s): CHOL, HDL, LDLCALC, TRIG, CHOLHDL, LDLDIRECT in the last 72 hours. Thyroid Function Tests: Recent Labs    02/15/20 1518  TSH 0.919   Anemia Panel: No results for input(s): VITAMINB12, FOLATE, FERRITIN, TIBC, IRON, RETICCTPCT in the last 72 hours. Urine analysis:    Component Value Date/Time   COLORURINE YELLOW 06/12/2019 0230   APPEARANCEUR CLEAR  06/12/2019 0230   APPEARANCEUR Clear 08/08/2016 0900   LABSPEC 1.034 (H) 06/12/2019 0230   PHURINE 5.0 06/12/2019 0230   GLUCOSEU NEGATIVE 06/12/2019 0230   HGBUR SMALL (A) 06/12/2019 0230   BILIRUBINUR NEGATIVE 06/12/2019 0230   BILIRUBINUR negative 05/02/2018 1337   BILIRUBINUR Negative 08/08/2016 0900   KETONESUR NEGATIVE 06/12/2019 0230  PROTEINUR NEGATIVE 06/12/2019 0230   UROBILINOGEN 0.2 05/02/2018 1337   NITRITE NEGATIVE 06/12/2019 0230   LEUKOCYTESUR NEGATIVE 06/12/2019 0230   Sepsis Labs: !!!!!!!!!!!!!!!!!!!!!!!!!!!!!!!!!!!!!!!!!!!! @LABRCNTIP (procalcitonin:4,lacticidven:4) )No results found for this or any previous visit (from the past 240 hour(s)).   Radiological Exams on Admission: DG Chest Port 1 View  Result Date: 02/15/2020 CLINICAL DATA:  Cough. EXAM: PORTABLE CHEST 1 VIEW COMPARISON:  April 23, 2019. FINDINGS: The heart size and mediastinal contours are within normal limits. Both lungs are clear. The visualized skeletal structures are unremarkable. IMPRESSION: No active disease. Electronically Signed   By: Marijo Conception M.D.   On: 02/15/2020 15:43   CT HEAD CODE STROKE WO CONTRAST  Result Date: 02/15/2020 CLINICAL DATA:  Code stroke. Left-sided weakness. Facial droop and slurred speech. EXAM: CT HEAD WITHOUT CONTRAST TECHNIQUE: Contiguous axial images were obtained from the base of the skull through the vertex without intravenous contrast. COMPARISON:  December 31, 2019. FINDINGS: Brain: No evidence of acute large vascular territory infarct. There is moderate to advanced patchy white matter hypoattenuation, nonspecific but most likely related to chronic microvascular ischemic disease. No acute hemorrhage. Moderate diffuse cerebral atrophy with ex vacuo ventricular dilation. No mass lesion or abnormal mass effect. No extra-axial fluid collection. Vascular: No definite hyperdense vessel. Mild diffuse hyperdensity of the dural sinuses and vessels appears similar to prior.  Skull: No acute fracture. Sinuses/Orbits: Sinuses are largely clear. Other: No mastoid effusions. ASPECTS [facility] Stroke Program Early CT Score) Total score (0-10 with 10 being normal): 10 IMPRESSION: 1. No evidence of acute large vascular territory infarct.ASPECTS is 10. 2. Moderate to advanced patchy white matter hypoattenuation appears similar to prior and presumably related to chronic microvascular ischemic disease. A small white matter infarct could be obscured and MRI could better evaluate for acute ischemia if clinically indicated. Code stroke imaging results were communicated on 02/15/2020 at 1:45 pm to provider Dr. Theda Sers Via telephone, who verbally acknowledged these results. Electronically Signed   By: Margaretha Sheffield MD   On: 02/15/2020 13:48   CT ANGIO HEAD CODE STROKE  Result Date: 02/15/2020 CLINICAL DATA:  Speech difficulty and left-sided weakness. EXAM: CT ANGIOGRAPHY HEAD AND NECK TECHNIQUE: Multidetector CT imaging of the head and neck was performed using the standard protocol during bolus administration of intravenous contrast. Multiplanar CT image reconstructions and MIPs were obtained to evaluate the vascular anatomy. Carotid stenosis measurements (when applicable) are obtained utilizing NASCET criteria, using the distal internal carotid diameter as the denominator. CONTRAST:  55mL OMNIPAQUE IOHEXOL 350 MG/ML SOLN COMPARISON:  Same day CT head. FINDINGS: CTA NECK FINDINGS Aortic arch: Calcific atherosclerosis of the aorta. Great vessel origins are patent. Right carotid system: No evidence of dissection, stenosis (50% or greater) or occlusion. Left carotid system: No evidence of dissection, stenosis (50% or greater) or occlusion. Vertebral arteries: Left dominant. The right vertebral artery is diminutive throughout its course. No evidence of significant focal stenosis in the neck. Skeleton: Mild multilevel degenerative change of the cervical spine. Other neck: Enlarged, heterogeneous  thyroid with numerous nodules and retrosternal extension. Upper chest: Paraseptal and centrilobular emphysema. No focal consolidation. Review of the MIP images confirms the above findings CTA HEAD FINDINGS Anterior circulation: No significant stenosis, proximal occlusion, aneurysm, or vascular malformation. 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 The left intradural vertebral artery is patent and without evidence of significant stenosis. Diminutive vertebrobasilar system with bilateral fetal type PCAs. Bilateral posterior cerebral arteries are patent without proximal hemodynamically  significant stenosis. Evaluation of the distal PCAs is limited by venous contamination. Venous sinuses: As permitted by contrast timing, patent. Review of the MIP images confirms the above findings IMPRESSION: 1. 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 2. Otherwise, no large vessel occlusion or proximal hemodynamically significant stenosis in the head or neck 3. Diminutive vertebrobasilar system intracranially with bilateral fetal type PCAs, anatomic variant. 4. Enlarged, heterogeneous thyroid with numerous nodules and retrosternal extension. This may reflect goiter; however, recommend thyroid ultrasound to further evaluate. 5. Paraseptal and centrilobular emphysema. Findings were communicated on 02/15/2020 at 1:45 pm to provider Dr. Theda Sers Via telephone, who verbally acknowledged these results. Electronically Signed   By: Margaretha Sheffield MD   On: 02/15/2020 14:00   CT ANGIO NECK CODE STROKE  Result Date: 02/15/2020 CLINICAL DATA:  Speech difficulty and left-sided weakness. EXAM: CT ANGIOGRAPHY HEAD AND NECK TECHNIQUE: Multidetector CT imaging of the head and neck was performed using the standard protocol during bolus administration of intravenous contrast. Multiplanar CT image reconstructions and  MIPs were obtained to evaluate the vascular anatomy. Carotid stenosis measurements (when applicable) are obtained utilizing NASCET criteria, using the distal internal carotid diameter as the denominator. CONTRAST:  72mL OMNIPAQUE IOHEXOL 350 MG/ML SOLN COMPARISON:  Same day CT head. FINDINGS: CTA NECK FINDINGS Aortic arch: Calcific atherosclerosis of the aorta. Great vessel origins are patent. Right carotid system: No evidence of dissection, stenosis (50% or greater) or occlusion. Left carotid system: No evidence of dissection, stenosis (50% or greater) or occlusion. Vertebral arteries: Left dominant. The right vertebral artery is diminutive throughout its course. No evidence of significant focal stenosis in the neck. Skeleton: Mild multilevel degenerative change of the cervical spine. Other neck: Enlarged, heterogeneous thyroid with numerous nodules and retrosternal extension. Upper chest: Paraseptal and centrilobular emphysema. No focal consolidation. Review of the MIP images confirms the above findings CTA HEAD FINDINGS Anterior circulation: No significant stenosis, proximal occlusion, aneurysm, or vascular malformation. 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 The left intradural vertebral artery is patent and without evidence of significant stenosis. Diminutive vertebrobasilar system with bilateral fetal type PCAs. Bilateral posterior cerebral arteries are patent without proximal hemodynamically significant stenosis. Evaluation of the distal PCAs is limited by venous contamination. Venous sinuses: As permitted by contrast timing, patent. Review of the MIP images confirms the above findings IMPRESSION: 1. 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 2. Otherwise, no large vessel occlusion or proximal hemodynamically significant stenosis in the head or neck 3. Diminutive  vertebrobasilar system intracranially with bilateral fetal type PCAs, anatomic variant. 4. Enlarged, heterogeneous thyroid with numerous nodules and retrosternal extension. This may reflect goiter; however, recommend thyroid ultrasound to further evaluate. 5. Paraseptal and centrilobular emphysema. Findings were communicated on 02/15/2020 at 1:45 pm to provider Dr. Theda Sers Via telephone, who verbally acknowledged these results. Electronically Signed   By: Margaretha Sheffield MD   On: 02/15/2020 14:00     All images have been reviewed by me personally.  EKG: Independently reviewed.   Assessment/Plan Principal Problem:   CVA (cerebral vascular accident) ([place]) Active Problems:   Tobacco abuse   Hyperlipidemia   Old MI (myocardial infarction)   CAD (coronary artery disease)   Cigarette nicotine dependence without complication   Subclinical hyperthyroidism   Pulmonary emphysema ([facility])   Thyroid mass   Anxiety and depression   Assessment  plan  #Slurred speech, left lower  droop and left-sided weakness concerning for acute stroke  Head CT showed no evidence of acute infarct  CTA head and neck showed  The nondominant right vertebral artery is diminutive throughout its course and likely occluded intradurally Diminutive vertebrobasilar system intracranially with bilateral fetal type PCAs, anatomic variant.  Addendum Brain MRI resulted and showed Acute cortical and subcortical infarct in the high right posterior frontal lobe  I updated to neurology on call MD Dr. Velvet Bathe. Continue current management  Charlann Lange, MD 1920pm 02/15/20 - admit to med telemetry -Neurology consult and appreciate input.  Neurology recommended the following  -bedsideSwallow screen-failed.  He is NPO and on aspiration precaution.. -MRI brain without contrast pending -TTE pending -labs HbA1c, lipid panel, TSH. - Aspirin 325mg  now (ordered) then 81mg  daily. - Clopidogrel 75mg  daily for 3 weeks. - SBP goal -  Permissive hypertension first 24 h < 220/110. Hold home medications for now. - Maintain MAP >65.  -Stroke education ordered - PT/OT/SLP consult.     #Thyroid mass #History of hyperthyroidism -CT noted "Enlarged, heterogeneous thyroid with numerous nodules and retrosternal extension. This may reflect goiter; however, recommend thyroid ultrasound to further evaluate. - TSH normal - Thyroid U/S pending  #COPD #Tobacco abuse 2 pack a day  -No evidence of COPD exacerbation -tobacco cessation education done -Nicotine patch  #History of CAD and HLD -chronic and at baseline  #Anxiety and depression -chronic and at baseline  #Ethics  He lives with his dog at home.  Patient told me that he had asked somebody to take care of his dog -       DVT prophylaxis: SCD Code Status: Full Code Family Communication: Sister in law over the phone Consults called: Neurology Admission status: Inpatient  Status is: Inpatient  Remains inpatient appropriate because:Ongoing diagnostic testing needed not appropriate for outpatient work up   Dispo: The patient is from: Home              Anticipated d/c is to: Pending hospital course              Anticipated d/c date is: > 3 days              Patient currently is not medically stable to d/c.   Difficult to place patient No       Time Spent: 65 minutes.  >50% of the time was devoted to discussing the patients care, assessment, plan and disposition with other care givers along with counseling the patient about the risks and benefits of treatment.    Charlann Lange MD [facility]  If 7PM-7AM, please contact night-coverage   02/15/2020, 4:58 PM

## 2020-02-15 NOTE — ED Triage Notes (Signed)
Per ems pt called them himself bc he couldn't talk when he woke up. Per ems initial NIH was 4 for them. Pt presents with slurred speech, left facial droop, and left arm weakness. Pt a&OX4.  LNK 0100am

## 2020-02-16 ENCOUNTER — Inpatient Hospital Stay (HOSPITAL_COMMUNITY): Payer: Medicare Other

## 2020-02-16 DIAGNOSIS — I252 Old myocardial infarction: Secondary | ICD-10-CM | POA: Diagnosis not present

## 2020-02-16 DIAGNOSIS — N400 Enlarged prostate without lower urinary tract symptoms: Secondary | ICD-10-CM

## 2020-02-16 DIAGNOSIS — F17210 Nicotine dependence, cigarettes, uncomplicated: Secondary | ICD-10-CM | POA: Diagnosis not present

## 2020-02-16 DIAGNOSIS — I6389 Other cerebral infarction: Secondary | ICD-10-CM | POA: Diagnosis not present

## 2020-02-16 DIAGNOSIS — I639 Cerebral infarction, unspecified: Secondary | ICD-10-CM

## 2020-02-16 DIAGNOSIS — I700 Atherosclerosis of aorta: Secondary | ICD-10-CM | POA: Diagnosis not present

## 2020-02-16 DIAGNOSIS — I63411 Cerebral infarction due to embolism of right middle cerebral artery: Secondary | ICD-10-CM | POA: Diagnosis not present

## 2020-02-16 DIAGNOSIS — E669 Obesity, unspecified: Secondary | ICD-10-CM | POA: Diagnosis present

## 2020-02-16 LAB — LIPID PANEL
Cholesterol: 168 mg/dL (ref 0–200)
HDL: 41 mg/dL (ref >40)
LDL Cholesterol: 111 mg/dL — ABNORMAL HIGH (ref 0–99)
Total CHOL/HDL Ratio: 4.1 RATIO
Triglycerides: 80 mg/dL (ref <150)
VLDL: 16 mg/dL (ref 0–40)

## 2020-02-16 LAB — COMPREHENSIVE METABOLIC PANEL
ALT: 17 U/L (ref 0–44)
AST: 24 U/L (ref 15–41)
Albumin: 3.5 g/dL (ref 3.5–5.0)
Alkaline Phosphatase: 95 U/L (ref 38–126)
Anion gap: 12 (ref 5–15)
BUN: 10 mg/dL (ref 8–23)
CO2: 24 mmol/L (ref 22–32)
Calcium: 8.8 mg/dL — ABNORMAL LOW (ref 8.9–10.3)
Chloride: 101 mmol/L (ref 98–111)
Creatinine, Ser: 0.98 mg/dL (ref 0.61–1.24)
GFR, Estimated: >60 mL/min (ref >60)
Glucose, Bld: 92 mg/dL (ref 70–99)
Potassium: 3.7 mmol/L (ref 3.5–5.1)
Sodium: 137 mmol/L (ref 135–145)
Total Bilirubin: 1.5 mg/dL — ABNORMAL HIGH (ref 0.3–1.2)
Total Protein: 6.3 g/dL — ABNORMAL LOW (ref 6.5–8.1)

## 2020-02-16 LAB — CBC
HCT: 48.6 % (ref 39.0–52.0)
Hemoglobin: 16.9 g/dL (ref 13.0–17.0)
MCH: 29.8 pg (ref 26.0–34.0)
MCHC: 34.8 g/dL (ref 30.0–36.0)
MCV: 85.7 fL (ref 80.0–100.0)
Platelets: 170 10*3/uL (ref 150–400)
RBC: 5.67 MIL/uL (ref 4.22–5.81)
RDW: 13.2 % (ref 11.5–15.5)
WBC: 13.9 10*3/uL — ABNORMAL HIGH (ref 4.0–10.5)
nRBC: 0 % (ref 0.0–0.2)

## 2020-02-16 LAB — ECHOCARDIOGRAM COMPLETE
Area-P 1/2: 3.27 cm2
Calc EF: 69.4 %
Height: 73 in
S' Lateral: 2.8 cm
Single Plane A2C EF: 69.6 %
Single Plane A4C EF: 69.8 %
Weight: 3520 oz

## 2020-02-16 LAB — HEMOGLOBIN A1C
Hgb A1c MFr Bld: 5.2 % (ref 4.8–5.6)
Mean Plasma Glucose: 102.54 mg/dL

## 2020-02-16 MED ORDER — LORAZEPAM 2 MG/ML IJ SOLN
0.5000 mg | Freq: Four times a day (QID) | INTRAMUSCULAR | Status: DC | PRN
  Administered 2020-02-17 – 2020-02-19 (×3): 0.5 mg via INTRAVENOUS
  Filled 2020-02-16 (×4): qty 1

## 2020-02-16 MED ORDER — NICOTINE 21 MG/24HR TD PT24
21.0000 mg | MEDICATED_PATCH | Freq: Every day | TRANSDERMAL | Status: DC
  Administered 2020-02-16 – 2020-02-24 (×10): 21 mg via TRANSDERMAL
  Filled 2020-02-16 (×10): qty 1

## 2020-02-16 MED ORDER — LORAZEPAM 2 MG/ML IJ SOLN: 1.0000 mg | Freq: Once | INTRAMUSCULAR | Status: DC

## 2020-02-16 MED ORDER — LORAZEPAM 2 MG/ML IJ SOLN
1.0000 mg | Freq: Once | INTRAMUSCULAR | Status: AC
  Administered 2020-02-16: 1 mg via INTRAVENOUS
  Filled 2020-02-16: qty 1

## 2020-02-16 MED ORDER — SODIUM CHLORIDE 0.9 % IV SOLN: INTRAVENOUS | Status: DC

## 2020-02-16 MED ORDER — LORAZEPAM 2 MG/ML IJ SOLN
INTRAMUSCULAR | Status: AC
  Administered 2020-02-16: 1 mg
  Filled 2020-02-16: qty 1

## 2020-02-16 MED ORDER — TRAZODONE HCL 50 MG PO TABS
50.0000 mg | ORAL_TABLET | Freq: Every day | ORAL | Status: DC
  Administered 2020-02-16 – 2020-02-23 (×7): 50 mg via ORAL
  Filled 2020-02-16 (×7): qty 1

## 2020-02-16 NOTE — Evaluation (Signed)
Physical Therapy Evaluation Patient Details Name: David Hendricks MRN: [id number] DOB: [birth year] Today's Date: 02/16/2020   History of Present Illness  Pt is a 70 y/o male admitted secondary to slurred speech and L sided weakness. 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 PMH includes tobacco use, CAD, COPD, anxiety, and depression.  Clinical Impression  Pt admitted secondary to problem above with deficits below. Decreased safety awareness and awareness of deficits. Pt also with LUE/LLE weakness and L lateral lean in sitting. Max A for bed mobility tasks. Feel pt would benefit from SNF level therapies at d/c. Pt currently lives alone and is at increased risk for falls. Will continue to follow acutely to maximize functional mobility independence and safety.     Follow Up Recommendations SNF;Supervision/Assistance - 24 hour    Equipment Recommendations  Wheelchair cushion (measurements PT);Wheelchair (measurements PT)    Recommendations for Other Services       Precautions / Restrictions Precautions Precautions: Fall Restrictions Weight Bearing Restrictions: No      Mobility  Bed Mobility Overal bed mobility: Needs Assistance Bed Mobility: Supine to Sit;Sit to Supine     Supine to sit: Max assist Sit to supine: Max assist   General bed mobility comments: MAx A for trunk and LE assist. Multimodal cues for sequencing. Attempted to have pt perform lateral scoot, however, pt unable to maintain balance to do so and requesting to return to supine. Pt with L lateral lean in sitting.    Transfers                 General transfer comment: Unsafe to attempt with +1  Ambulation/Gait                Stairs            Wheelchair Mobility    Modified Rankin (Stroke Patients Only)       Balance Overall balance assessment: Needs assistance Sitting-balance support: Single extremity supported;Feet supported Sitting balance-Leahy Scale: Poor Sitting balance -  Comments: Reliant on at least RUE support                                     Pertinent Vitals/Pain Pain Assessment: No/denies pain    Home Living Family/patient expects to be discharged to:: Private residence Living Arrangements: Alone Available Help at Discharge: Other (Comment) (no one close) Type of Home: Apartment Home Access: Level entry     Home Layout: One level Home Equipment: None      Prior Function Level of Independence: Independent               Hand Dominance        Extremity/Trunk Assessment   Upper Extremity Assessment Upper Extremity Assessment: Defer to OT evaluation    Lower Extremity Assessment Lower Extremity Assessment: LLE deficits/detail LLE Deficits / Details: Grossly 3/5 throughout. Could not hold knee in full extension when sitting at EOB for long period. Difficulty performing SLR.    Cervical / Trunk Assessment Cervical / Trunk Assessment: Kyphotic  Communication   Communication: Expressive difficulties (slurred speech. Difficult to understand at times.)  Cognition Arousal/Alertness: Awake/alert Behavior During Therapy: [facility] for tasks assessed/performed Overall Cognitive Status: No family/caregiver present to determine baseline cognitive functioning  General Comments: Decreased awareness of safety and deficits. Disoriented to time and situation. States "the rumor is I had a stroke", but pt unsure that he had one. Slow processing noted.      General Comments      Exercises     Assessment/Plan    PT Assessment Patient needs continued PT services  PT Problem List Decreased strength;Decreased range of motion;Decreased balance;Decreased activity tolerance;Decreased mobility;Decreased knowledge of use of DME;Decreased knowledge of precautions       PT Treatment Interventions Gait training;DME instruction;Functional mobility training;Therapeutic activities;Therapeutic  exercise;Balance training;Patient/family education    PT Goals (Current goals can be found in the Care Plan section)  Acute Rehab PT Goals Patient Stated Goal: none stated PT Goal Formulation: With patient Time For Goal Achievement: 03/01/20 Potential to Achieve Goals: Fair    Frequency Min 3X/week   Barriers to discharge Decreased caregiver support      Co-evaluation               AM-PAC PT "6 Clicks" Mobility  Outcome Measure Help needed turning from your back to your side while in a flat bed without using bedrails?: A Lot Help needed moving from lying on your back to sitting on the side of a flat bed without using bedrails?: A Lot Help needed moving to and from a bed to a chair (including a wheelchair)?: A Lot Help needed standing up from a chair using your arms (e.g., wheelchair or bedside chair)?: A Lot Help needed to walk in hospital room?: A Lot Help needed climbing 3-5 steps with a railing? 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

## 2020-02-16 NOTE — Evaluation (Signed)
Clinical/Bedside Swallow Evaluation Patient Details  Name: David Hendricks MRN: [id number] Date of Birth: Nov 26, 1950  Today's Date: 02/16/2020 Time: SLP Start Time (ACUTE ONLY): 78 SLP Stop Time (ACUTE ONLY): 1605 SLP Time Calculation (min) (ACUTE ONLY): 15 min  Past Medical History:  Past Medical History:  Diagnosis Date  . Allergy   . Anxiety   . Aortic atherosclerosis ([place])   . Arthritis   . Benign prostatic hyperplasia (BPH) with urinary urgency   . Chronic kidney disease    KIDNEY STONES  . COPD (chronic obstructive pulmonary disease) ([place])    possible   . Coronary artery disease    10/18 PCI/DES to pLAD, mild nonobstructive disease in the Lcx/RCA. Normal EF.   Marland Kitchen Depression   . Dyslipidemia   . Dysrhythmia 2016   irregular heartbeat  . Gallstones   . GERD (gastroesophageal reflux disease)   . History of kidney stones   . Hyperlipidemia   . Hyperthyroidism   . Leg cramps   . Multiple pulmonary nodules   . Myocardial infarction ([place])    2018  . Pneumonia   . Seasonal allergies   . Thyroid nodule   . Vertigo   . Vitamin B 12 deficiency    Past Surgical History:  Past Surgical History:  Procedure Laterality Date  . COLONOSCOPY    . CORONARY ANGIOPLASTY  10/09/2016  . CORONARY STENT INTERVENTION N/A 10/09/2016   Procedure: CORONARY STENT INTERVENTION;  Surgeon: Nelva Bush, MD;  Location: [facility];  Service: Cardiovascular;  Laterality: N/A;  . CYSTOSCOPY WITH RETROGRADE PYELOGRAM, URETEROSCOPY AND STENT PLACEMENT Left 06/17/2019   Procedure: CYSTOSCOPY WITH RETROGRADE PYELOGRAM, URETEROSCOPY AND STENT PLACEMENT;  Surgeon: Robley Fries, MD;  Location: [facility];  Service: Urology;  Laterality: Left;  1 HR  . HOLMIUM LASER APPLICATION Left 6/0/6301   Procedure: HOLMIUM LASER APPLICATION;  Surgeon: Robley Fries, MD;  Location: [facility];  Service: Urology;  Laterality: Left;  . INTRAVASCULAR ULTRASOUND/IVUS N/A 10/09/2016   Procedure: Intravascular  Ultrasound/IVUS;  Surgeon: Nelva Bush, MD;  Location: [facility];  Service: Cardiovascular;  Laterality: N/A;  . IR CHOLANGIOGRAM EXISTING TUBE  11/18/2018  . IR PERC CHOLECYSTOSTOMY  11/16/2018  . LEFT HEART CATH AND CORONARY ANGIOGRAPHY N/A 10/09/2016   Procedure: LEFT HEART CATH AND CORONARY ANGIOGRAPHY;  Surgeon: Nelva Bush, MD;  Location: [facility];  Service: Cardiovascular;  Laterality: N/A;   HPI:  Pt is a 70 y/o male admitted secondary to slurred speech and L sided weakness. 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 PMH includes tobacco use, CAD, COPD, anxiety, and depression.   Assessment / Plan / Recommendation Clinical Impression  Pt presents with L-sided facial weakness (CN VII), with lack of dentition also impacting ability to masticate. He pushes a graham cracker around with his tongue until it starts to dissolve, but does not make any active attempts at chewing despite prompts to do so. No overt s/s of aspiration were noted initially with thin liquids, but upon challenging with larger volumes, there is a strong, prolonged cough response. No overt s/s of aspiration are noted with thickening of liquids to nectar thick consistency. Recommend starting Dys 1 diet and nectar thick liquids for today until MBS can be completed on next date at the earliest. SLP Visit Diagnosis: Dysphagia, unspecified (R13.10)    Aspiration Risk  Moderate aspiration risk    Diet Recommendation Dysphagia 1 (Puree);Nectar-thick liquid   Liquid Administration via: Cup;Straw Medication Administration: Crushed  with puree Supervision: Staff to assist with self feeding;Full supervision/cueing for compensatory strategies Compensations: Minimize environmental distractions;Slow rate;Small sips/bites Postural Changes: Seated upright at 90 degrees;Remain upright for at least 30 minutes after po intake    Other  Recommendations Oral Care Recommendations: Oral care BID   Follow up  Recommendations Skilled Nursing facility      Frequency and Duration            Prognosis Prognosis for Safe Diet Advancement: Good      Swallow Study   General HPI: Pt is a 70 y/o male admitted secondary to slurred speech and L sided weakness. 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 PMH includes tobacco use, CAD, COPD, anxiety, and depression. Type of Study: Bedside Swallow Evaluation Previous Swallow Assessment: none in chart Diet Prior to this Study: NPO Temperature Spikes Noted: No Respiratory Status: Room air History of Recent Intubation: No Behavior/Cognition: Alert;Cooperative;Pleasant mood;Requires cueing Oral Cavity Assessment: Within Functional Limits Oral Care Completed by SLP: Recent completion by staff Oral Cavity - Dentition: Edentulous;Dentures, not available Vision: Functional for self-feeding Self-Feeding Abilities: Needs assist Patient Positioning: Upright in bed Baseline Vocal Quality: Other (comment) (intermittent pitch breaks) Volitional Cough: Strong Volitional Swallow: Able to elicit    Oral/Motor/Sensory Function Overall Oral Motor/Sensory Function: Moderate impairment Facial ROM: Reduced left;Suspected CN VII (facial) dysfunction Facial Symmetry: Abnormal symmetry left;Suspected CN VII (facial) dysfunction Facial Strength: Reduced left;Suspected CN VII (facial) dysfunction Facial Sensation: Within Functional Limits Lingual ROM: Within Functional Limits Lingual Symmetry: Within Functional Limits Lingual Strength: Reduced Velum:  (difficult to visualize) Mandible: Within Functional Limits   Ice Chips Ice chips: Not tested   Thin Liquid Thin Liquid: Impaired Presentation: Cup;Self Fed;Straw Pharyngeal  Phase Impairments: Cough - Immediate    Nectar Thick Nectar Thick Liquid: Within functional limits Presentation: Self Fed;Straw   Honey Thick Honey Thick Liquid: Not tested   Puree Puree: Within functional limits Presentation: Spoon    Solid     Solid: Impaired Oral Phase Impairments: Impaired mastication      Osie Bond., M.A. [place] Acute Rehabilitation Services Pager [phone] Office [phone]  02/16/2020,4:28 PM

## 2020-02-16 NOTE — Progress Notes (Signed)
Progress Note    David Hendricks  XLK:[id number] DOB: September 06, 1950  DOA: 02/15/2020 PCP: Horald Pollen, MD    Brief Narrative:   Chief complaint: Slurred speech and left-sided weakness.  Medical records reviewed and are as summarized below:  David Hendricks is an 70 y.o. male with a PMH of tobacco abuse 2 pack a day, COPD, CKD, CAD, HLD, hyperthyroidism, thyroid nodule, BPH, anxiety and depression, who presented to the ED via EMS with slurred speech and left-sided weakness. CODE STROKE activated. In the ED, evaluated by neurologist who noted dysarthria and a left facial droop. 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  Assessment/Plan:   Principal Problem:  Acute CVA (cerebrovascular accident), POA Admitted to telemetry. Stroke service consulted. tPA not given because patient outside of normal window. CT head showed no evidence of acute large vascular territory infarct. 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 CTA of head/neck showed nondominant right vertebral artery is diminutive throughout its course and likely occluded intradurally. Continue aspirin daily. FLP showed a cholesterol of 168, LDL 111. Continue statin.  HgbA1c 5.2%.  F/U 2 D Echocardiogram. NPO pending swallowing evaluation. Counsel on smoking cessation. PT/OT/ST evaluations pending.   Active Problems:   Hyperbilirubinemia Mild, unclear etiology. Outpatient work up recommended.    Tobacco abuse, POA RN to provide tobacco cessation counseling. Increase Nicoderm patch to 21 mg (smokes 2 PPD).    Hyperlipidemia, POA FLP showed a cholesterol of  168, LDL 111. Continue statin.    CAD (coronary artery disease) with h/o MI and aortic atherosclerosis, POA Continue ASA/statin.    Subclinical hyperthyroidism/thyroid mass/multinodular goiter, POA Continue methimazole.    Pulmonary emphysema (HCC) Continue PRN albuterol. Not taking symbicort anymore.    Anxiety and depression Resume Desyrel Q HS.    BPH, POA Continue Flomax.    Obesity, chronic, POA Nutritional status        Body mass index is 29.03 kg/m.   Progression: Sister tells me he has no close family that lives near-by and she lives in [place]. He has no children. He is already threatening to leave AMA because he hasn't eaten or had anything to drink due to failing swallowing evaluation.    Family Communication/Anticipated D/C date and plan/Code Status   DVT prophylaxis: SCD's Start: 02/15/20 1620  Current Level of Care:: Telemetry Medical Code Status: Full Code.  Family Communication: Sister updated by telephone. Disposition Plan: Status is: Inpatient  Remains inpatient appropriate because:Inpatient level of care appropriate due to severity of illness: Acute CVA with deficits. Needs PT/OT/ST evaluations, likely will need a SNF.   Dispo: The patient is from: Home              Anticipated d/c is to: SNF              Anticipated d/c date is: 2 days              Patient currently is not medically stable to d/c.   Difficult to place patient No   Medical Consultants:    Neurology   Anti-Infectives:    None  Subjective:   Irritable.  Wants something to eat/drink but failed swallow evaluation.  Wants to be discharged. Threatening to call someone to come pick him up.    Objective:    Vitals:   02/15/20 2345 02/16/20 0100 02/16/20 0230 02/16/20 0628  BP:    125/73  Pulse: 88 81 88 83  Resp: 20 (!) 21 (!) 22 19  Temp:      TempSrc:      SpO2: 90% 93% 93% 90%  Weight:      Height:        Intake/Output Summary (Last 24 hours) at 02/16/2020 0756 Last  data filed at 02/15/2020 1544 Gross per 24 hour  Intake 1000 ml  Output --  Net 1000 ml   Filed Weights   02/15/20 1355  Weight: 99.8 kg    Exam: General: Anxious, irritable, restless. Obese. Cardiovascular: Heart sounds show a regular rate, and rhythm. No gallops or rubs. No murmurs. No JVD. Lungs: Clear to auscultation bilaterally with good air movement. No rales, rhonchi or wheezes. Abdomen: Soft, nontender, nondistended with normal active bowel sounds. No masses. No hepatosplenomegaly. Neurological: Prominent dysarthria. Left sided weakness. Alert and oriented to place/month/year. Skin: Warm and dry. No rashes or lesions. Extremities: No clubbing or cyanosis. No edema. Pedal pulses 2+. Psychiatric: Mood and affect are irritable. Insight and judgment are poor.    Data Reviewed:   I have personally reviewed following labs and imaging studies:  Labs: Labs show the following:   Basic Metabolic Panel: Recent Labs  Lab 02/15/20 1325 02/15/20 1333 02/16/20 0500  NA 139 139 137  K 4.4 3.9 3.7  CL 102 102 101  CO2 25  --  24  GLUCOSE 104* 104* 92  BUN 9 10 10   CREATININE 1.00 0.90 0.98  CALCIUM 9.6  --  8.8*   GFR Estimated Creatinine Clearance: 87.2 mL/min (by C-G formula based on SCr of 0.98 mg/dL). Liver Function Tests: Recent Labs  Lab 02/15/20 1325 02/16/20 0500  AST 33 24  ALT 18 17  ALKPHOS 106 95  BILITOT 1.5* 1.5*  PROT 6.7 6.3*  ALBUMIN 3.6 3.5   Coagulation profile Recent Labs  Lab 02/15/20 1325  INR 0.9    CBC: Recent Labs  Lab 02/15/20 1325 02/15/20 1333 02/16/20 0500  WBC 9.7  --  13.9*  NEUTROABS 6.3  --   --   HGB 18.3* 18.4* 16.9  HCT 55.2* 54.0* 48.6  MCV 86.4  --  85.7  PLT 192  --  170   Cardiac Enzymes: Recent Labs  Lab 02/15/20 1815  CKTOTAL 62   CBG: Recent Labs  Lab 02/15/20 1408  GLUCAP 96   Hgb A1c: Recent Labs    02/16/20 0500  HGBA1C 5.2   Lipid Profile: Recent Labs    02/16/20 0500  CHOL 168   HDL 41  LDLCALC 111*  TRIG 80  CHOLHDL 4.1   Thyroid function studies: Recent Labs    02/15/20 1518  TSH 0.919   Microbiology Recent Results (from the past 240 hour(s))  SARS CORONAVIRUS 2 (TAT 6-24 HRS) Nasopharyngeal Nasopharyngeal Swab     Status: None   Collection Time: 02/15/20  2:26 PM   Specimen: Nasopharyngeal Swab  Result Value Ref Range Status   SARS Coronavirus 2 NEGATIVE NEGATIVE Final    Comment: (NOTE) SARS-CoV-2 target nucleic acids are NOT DETECTED.  The SARS-CoV-2 RNA is generally detectable in upper and lower respiratory specimens during the acute phase of infection. Negative results do not preclude SARS-CoV-2 infection, do not rule out co-infections with other pathogens, and  should not be used as the sole basis for treatment or other patient management decisions. Negative results must be combined with clinical observations, patient history, and epidemiological information. The expected result is Negative.  Fact Sheet for Patients: SugarRoll.be  Fact Sheet for Healthcare Providers: https://www.woods-mathews.com/  This test is not yet approved or cleared by the [place] FDA and  has been authorized for detection and/or diagnosis of SARS-CoV-2 by FDA under an Emergency Use Authorization (EUA). This EUA will remain  in effect (meaning this test can be used) for the duration of the COVID-19 declaration under Se ction 564(b)(1) of the Act, 21 U.S.C. section 360bbb-3(b)(1), unless the authorization is terminated or revoked sooner.  Performed at [facility], [place] [address] [place], [place] [address]     Procedures and diagnostic studies:  MR BRAIN WO CONTRAST  Result Date: 02/15/2020 CLINICAL DATA:  Stroke follow-up. EXAM: MRI HEAD WITHOUT CONTRAST TECHNIQUE: Multiplanar, multiecho pulse sequences of the brain and surrounding structures were obtained without intravenous contrast. COMPARISON:  None.  FINDINGS: Brain: Acute cortical and subcortical infarct in the high right posterior frontal lobe no substantial edema or mass effect. Small focus of susceptibility artifact in this region may represent a small thrombosed distal cortical MCA branch (see series 18, image 40). There is an additional possible punctate focus of restricted diffusion in the right parietal cortex (see series 9, image 93 and series 10, image 39). Moderate to advanced additional scattered T2/FLAIR hyperintensities within the white matter, most likely related to chronic microvascular ischemic disease. No hydrocephalus. No acute hemorrhage. No extra-axial fluid collection. No abnormal mass lesion or abnormal mass effect. No midline shift. Basal cisterns are patent. Additional small focus of susceptibility artifact in the right parietal lobe may represent the sequela of prior microhemorrhage. Vascular: Major arterial flow voids are maintained at the skull base. Skull and upper cervical spine: Normal marrow signal. Sinuses/Orbits: Largely clear. Other: No mastoid effusions. IMPRESSION: 1. Acute cortical and subcortical infarct in the high right posterior frontal lobe. Small focus of susceptibility artifact in this region may represent a small thrombosed distal cortical MCA branch. 2. Possible additional punctate infarct in the right parietal cortex versus artifact. 3. No substantial edema or mass effect. 4. Moderate to advanced chronic microvascular ischemic disease. These results will be called to the ordering clinician or representative by the Radiologist Assistant, and communication documented in the PACS or [employer]. Electronically Signed   By: Margaretha Sheffield MD   On: 02/15/2020 18:44   DG Chest Port 1 View  Result Date: 02/15/2020 CLINICAL DATA:  Cough. EXAM: PORTABLE CHEST 1 VIEW COMPARISON:  April 23, 2019. FINDINGS: The heart size and mediastinal contours are within normal limits. Both lungs are clear. The visualized  skeletal structures are unremarkable. IMPRESSION: No active disease. Electronically Signed   By: Marijo Conception M.D.   On: 02/15/2020 15:43   CT HEAD CODE STROKE WO CONTRAST  Result Date: 02/15/2020 CLINICAL DATA:  Code stroke. Left-sided weakness. Facial droop and slurred speech. EXAM: CT HEAD WITHOUT CONTRAST TECHNIQUE: Contiguous axial images were obtained from the base of the skull through the vertex without intravenous contrast. COMPARISON:  December 31, 2019. FINDINGS: Brain: No evidence of acute large vascular territory infarct. There is moderate to advanced patchy white matter hypoattenuation, nonspecific but most likely related to chronic microvascular ischemic disease. No acute hemorrhage. Moderate diffuse cerebral atrophy with ex vacuo ventricular dilation. No mass lesion or abnormal mass effect. No extra-axial fluid collection. Vascular:  No definite hyperdense vessel. Mild diffuse hyperdensity of the dural sinuses and vessels appears similar to prior. Skull: No acute fracture. Sinuses/Orbits: Sinuses are largely clear. Other: No mastoid effusions. ASPECTS [facility] Stroke Program Early CT Score) Total score (0-10 with 10 being normal): 10 IMPRESSION: 1. No evidence of acute large vascular territory infarct.ASPECTS is 10. 2. Moderate to advanced patchy white matter hypoattenuation appears similar to prior and presumably related to chronic microvascular ischemic disease. A small white matter infarct could be obscured and MRI could better evaluate for acute ischemia if clinically indicated. Code stroke imaging results were communicated on 02/15/2020 at 1:45 pm to provider Dr. Theda Sers Via telephone, who verbally acknowledged these results. Electronically Signed   By: Margaretha Sheffield MD   On: 02/15/2020 13:48   CT ANGIO HEAD CODE STROKE  Result Date: 02/15/2020 CLINICAL DATA:  Speech difficulty and left-sided weakness. EXAM: CT ANGIOGRAPHY HEAD AND NECK TECHNIQUE: Multidetector CT imaging of the head  and neck was performed using the standard protocol during bolus administration of intravenous contrast. Multiplanar CT image reconstructions and MIPs were obtained to evaluate the vascular anatomy. Carotid stenosis measurements (when applicable) are obtained utilizing NASCET criteria, using the distal internal carotid diameter as the denominator. CONTRAST:  32mL OMNIPAQUE IOHEXOL 350 MG/ML SOLN COMPARISON:  Same day CT head. FINDINGS: CTA NECK FINDINGS Aortic arch: Calcific atherosclerosis of the aorta. Great vessel origins are patent. Right carotid system: No evidence of dissection, stenosis (50% or greater) or occlusion. Left carotid system: No evidence of dissection, stenosis (50% or greater) or occlusion. Vertebral arteries: Left dominant. The right vertebral artery is diminutive throughout its course. No evidence of significant focal stenosis in the neck. Skeleton: Mild multilevel degenerative change of the cervical spine. Other neck: Enlarged, heterogeneous thyroid with numerous nodules and retrosternal extension. Upper chest: Paraseptal and centrilobular emphysema. No focal consolidation. Review of the MIP images confirms the above findings CTA HEAD FINDINGS Anterior circulation: No significant stenosis, proximal occlusion, aneurysm, or vascular malformation. 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 The left intradural vertebral artery is patent and without evidence of significant stenosis. Diminutive vertebrobasilar system with bilateral fetal type PCAs. Bilateral posterior cerebral arteries are patent without proximal hemodynamically significant stenosis. Evaluation of the distal PCAs is limited by venous contamination. Venous sinuses: As permitted by contrast timing, patent. Review of the MIP images confirms the above findings IMPRESSION: 1. The nondominant right vertebral artery is diminutive throughout its course and likely occluded  intradurally. 2. Otherwise, no large vessel occlusion or proximal hemodynamically significant stenosis in the head or neck 3. Diminutive vertebrobasilar system intracranially with bilateral fetal type PCAs, anatomic variant. 4. Enlarged, heterogeneous thyroid with numerous nodules and retrosternal extension. This may reflect goiter; however, recommend thyroid ultrasound to further evaluate. 5. Paraseptal and centrilobular emphysema. Findings were communicated on 02/15/2020 at 1:45 pm to provider Dr. Theda Sers Via telephone, who verbally acknowledged these results. Electronically Signed   By: Margaretha Sheffield MD   On: 02/15/2020 14:00   CT ANGIO NECK CODE STROKE  Result Date: 02/15/2020 CLINICAL DATA:  Speech difficulty and left-sided weakness. EXAM: CT ANGIOGRAPHY HEAD AND NECK TECHNIQUE: Multidetector CT imaging of the head and neck was performed using the standard protocol during bolus administration of intravenous contrast. Multiplanar CT image reconstructions and MIPs were obtained to evaluate the vascular anatomy. Carotid stenosis measurements (when applicable) are obtained utilizing NASCET criteria, using the distal internal carotid diameter as the denominator. CONTRAST:  8mL OMNIPAQUE IOHEXOL 350  MG/ML SOLN COMPARISON:  Same day CT head. FINDINGS: CTA NECK FINDINGS Aortic arch: Calcific atherosclerosis of the aorta. Great vessel origins are patent. Right carotid system: No evidence of dissection, stenosis (50% or greater) or occlusion. Left carotid system: No evidence of dissection, stenosis (50% or greater) or occlusion. Vertebral arteries: Left dominant. The right vertebral artery is diminutive throughout its course. No evidence of significant focal stenosis in the neck. Skeleton: Mild multilevel degenerative change of the cervical spine. Other neck: Enlarged, heterogeneous thyroid with numerous nodules and retrosternal extension. Upper chest: Paraseptal and centrilobular emphysema. No focal consolidation.  Review of the MIP images confirms the above findings CTA HEAD FINDINGS Anterior circulation: No significant stenosis, proximal occlusion, aneurysm, or vascular malformation. 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 The left intradural vertebral artery is patent and without evidence of significant stenosis. Diminutive vertebrobasilar system with bilateral fetal type PCAs. Bilateral posterior cerebral arteries are patent without proximal hemodynamically significant stenosis. Evaluation of the distal PCAs is limited by venous contamination. Venous sinuses: As permitted by contrast timing, patent. Review of the MIP images confirms the above findings IMPRESSION: 1. 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 2. Otherwise, no large vessel occlusion or proximal hemodynamically significant stenosis in the head or neck 3. Diminutive vertebrobasilar system intracranially with bilateral fetal type PCAs, anatomic variant. 4. Enlarged, heterogeneous thyroid with numerous nodules and retrosternal extension. This may reflect goiter; however, recommend thyroid ultrasound to further evaluate. 5. Paraseptal and centrilobular emphysema. Findings were communicated on 02/15/2020 at 1:45 pm to provider Dr. Theda Sers Via telephone, who verbally acknowledged these results. Electronically Signed   By: Margaretha Sheffield MD   On: 02/15/2020 14:00    Medications:   .  stroke: mapping our early stages of recovery book   Does not apply Once  . aspirin EC  81 mg Oral Daily  . atorvastatin  80 mg Oral Daily  . clopidogrel  75 mg Oral Daily  . cyanocobalamin  1,000 mcg Intramuscular Q30 days  . methimazole  5 mg Oral Daily  . nicotine  14 mg Transdermal Daily  . tamsulosin  0.4 mg Oral Daily   Continuous Infusions: . sodium chloride 75 mL/hr at 02/15/20 1700     LOS: 1 day   Jacquelynn Cree, MD  [facility]   [facility] How to contact the [facility] Attending or Consulting provider [place] or covering provider during after hours [place], for this patient?  1. Check the care team in [facility] and look for a) attending/consulting [facility] provider listed and b) the [facility] team listed 2. Log into [email or web address] and use [place]'s universal password to access. If you do not have the password, please contact the hospital operator. 3. Locate the [facility] provider you are looking for under [facility] and page to a number that you can be directly reached. 4. If you still have difficulty reaching the provider, please page the [facility] (Director on Call) for the Hospitalists listed on amion for assistance.  02/16/2020, 7:56 AM

## 2020-02-16 NOTE — Evaluation (Signed)
Speech Language Pathology Evaluation Patient Details Name: David Hendricks MRN: [id number] DOB: December 26, 1950 Today's Date: 02/16/2020 Time: 6160-7371 SLP Time Calculation (min) (ACUTE ONLY): 14 min  Problem List:  Patient Active Problem List   Diagnosis Date Noted  . Obesity 02/16/2020  . Hyperbilirubinemia 02/16/2020  . CVA (cerebral vascular accident) ([place]) 02/15/2020  . Thyroid mass 02/15/2020  . Anxiety and depression 02/15/2020  . Pulmonary emphysema ([place]) 06/02/2019  . Aortic atherosclerosis ([place]) 06/02/2019  . Smokers' cough ([place]) 06/02/2019  . 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  . Multinodular goiter 12/24/2018  . Subclinical hyperthyroidism 12/24/2018  . BPH (benign prostatic hyperplasia) 11/20/2018  . Pulmonary nodules 11/15/2018  . Esophageal thickening 11/15/2018  . Cigarette nicotine dependence without complication 07/05/9483  . Old MI (myocardial infarction) 01/30/2017  . CAD (coronary artery disease) 01/30/2017  . Hyperlipidemia 10/10/2016  . History of non-ST elevation myocardial infarction (NSTEMI) 10/09/2016  . Tobacco abuse 08/28/2016  . Difficulty sleeping 04/22/2016  . Benign prostatic hyperplasia with urinary frequency 02/11/2016   Past Medical History:  Past Medical History:  Diagnosis Date  . Allergy   . Anxiety   . Aortic atherosclerosis ([place])   . Arthritis   . Benign prostatic hyperplasia (BPH) with urinary urgency   . Chronic kidney disease    KIDNEY STONES  . COPD (chronic obstructive pulmonary disease) ([place])    possible   . Coronary artery disease    10/18 PCI/DES to pLAD, mild nonobstructive disease in the Lcx/RCA. Normal EF.   [place] Kitchen Depression   . Dyslipidemia   . Dysrhythmia 2016   irregular heartbeat  . Gallstones   . GERD (gastroesophageal reflux disease)   . History of kidney stones   . Hyperlipidemia   . Hyperthyroidism   . Leg cramps   . Multiple pulmonary nodules   . Myocardial infarction ([place])    2018  . Pneumonia   . Seasonal  allergies   . Thyroid nodule   . Vertigo   . Vitamin B 12 deficiency    Past Surgical History:  Past Surgical History:  Procedure Laterality Date  . COLONOSCOPY    . CORONARY ANGIOPLASTY  10/09/2016  . CORONARY STENT INTERVENTION N/A 10/09/2016   Procedure: CORONARY STENT INTERVENTION;  Surgeon: Nelva Bush, MD;  Location: [facility];  Service: Cardiovascular;  Laterality: N/A;  . CYSTOSCOPY WITH RETROGRADE PYELOGRAM, URETEROSCOPY AND STENT PLACEMENT Left 06/17/2019   Procedure: CYSTOSCOPY WITH RETROGRADE PYELOGRAM, URETEROSCOPY AND STENT PLACEMENT;  Surgeon: Robley Fries, MD;  Location: [facility];  Service: Urology;  Laterality: Left;  1 HR  . HOLMIUM LASER APPLICATION Left 04/14/2701   Procedure: HOLMIUM LASER APPLICATION;  Surgeon: Robley Fries, MD;  Location: [facility];  Service: Urology;  Laterality: Left;  . INTRAVASCULAR ULTRASOUND/IVUS N/A 10/09/2016   Procedure: Intravascular Ultrasound/IVUS;  Surgeon: Nelva Bush, MD;  Location: [facility];  Service: Cardiovascular;  Laterality: N/A;  . IR CHOLANGIOGRAM EXISTING TUBE  11/18/2018  . IR PERC CHOLECYSTOSTOMY  11/16/2018  . LEFT HEART CATH AND CORONARY ANGIOGRAPHY N/A 10/09/2016   Procedure: LEFT HEART CATH AND CORONARY ANGIOGRAPHY;  Surgeon: Nelva Bush, MD;  Location: [facility];  Service: Cardiovascular;  Laterality: N/A;   HPI:  Pt is a 70 y/o male admitted secondary to slurred speech and L sided weakness. 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 PMH includes tobacco use, CAD, COPD, anxiety, and depression.   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 He has limited intellectual and emergent awareness of deficits, so it is unclear how his vocal quality specifically may differ from baseline. Pt also has reduced storage and retrieval of new information (recalling  2/4 words given multiple choice cues). He follows commands well when attentive enough. 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 Will continue to follow acute, anticipating need for SLP f/u and 24/7 supervision upon discharge.    SLP Assessment  SLP Recommendation/Assessment: Patient needs continued Speech Lanaguage Pathology Services SLP Visit Diagnosis: Dysarthria and anarthria (R47.1);Cognitive communication deficit (R41.841)    Follow Up Recommendations  Skilled Nursing facility    Frequency and Duration min 2x/week  2 weeks      SLP Evaluation Cognition  Overall Cognitive Status: No family/caregiver present to determine baseline cognitive functioning Arousal/Alertness: Awake/alert Orientation Level: Oriented to person;Disoriented to place;Disoriented to time;Disoriented to situation Attention: Sustained Sustained Attention: Impaired Sustained Attention Impairment: Verbal basic Memory: Impaired Memory Impairment: Storage deficit;Retrieval deficit;Decreased recall of new information Awareness: Impaired Awareness Impairment: Intellectual impairment;Emergent impairment Safety/Judgment: Impaired       Comprehension  Auditory Comprehension Overall Auditory Comprehension: Appears within functional limits for tasks assessed    Expression Expression Primary Mode of Expression: Verbal Verbal Expression Overall Verbal Expression: Appears within functional limits for tasks assessed   Oral / Motor  Oral Motor/Sensory Function Overall Oral Motor/Sensory Function: Moderate impairment Facial ROM: Reduced left;Suspected CN VII (facial) dysfunction Facial Symmetry: Abnormal symmetry left;Suspected CN VII (facial) dysfunction Facial Strength: Reduced left;Suspected CN VII (facial) dysfunction Facial Sensation: Within Functional Limits Lingual ROM:  Within Functional Limits Lingual Symmetry: Within Functional Limits Lingual Strength: Reduced Velum:  (difficult to visualize) Mandible: Within Functional Limits Motor Speech Overall Motor Speech: Impaired Respiration: Within functional limits Phonation: Other (comment) (intermittent pitch breaks) Resonance: Within functional limits Articulation: Impaired Level of Impairment: Word Intelligibility: Intelligibility reduced Word: 50-74% accurate Phrase: 50-74% accurate Motor Planning: Witnin functional limits Motor Speech Errors: Not applicable   GO                    Osie Bond., M.A. [facility] Pager [phone] Office [phone]  02/16/2020, 4:34 PM

## 2020-02-16 NOTE — Progress Notes (Signed)
Bilateral lower extremity venous duplex has been completed. Preliminary results can be found in CV Proc through chart review.   02/16/20 12:05 PM David Hendricks RVT

## 2020-02-16 NOTE — Progress Notes (Signed)
  Echocardiogram 2D Echocardiogram has been performed.  Bobbye Charleston 02/16/2020, 3:32 PM

## 2020-02-16 NOTE — Progress Notes (Signed)
Patient noted attempting to get out of bed. Neuro assess alert to self, place and time, unaware of dx, forgetful, unable to follow command at this time. Patient voices he wants to go home and per PT earlier he requires 2 assists. Patient noted super agitated and combative toward staffs at this time, staffs unable to reason with patient. MD called, orders in placed.

## 2020-02-16 NOTE — Progress Notes (Addendum)
STROKE TEAM PROGRESS NOTE   INTERVAL HISTORY He is evaluated at the bedside.  No family present at the bedside.  Patient states he is hungry and wants to eat something.  He states he is doing better than yesterday.  On examination, patient alert, oriented x 4. patient has slurred speech, left facial droop and left extremity weakness.  Patient was advised to stop smoking.  No arrhythmias observed on monitor.  Vitals stable.  Patient afebrile.  MRI showed Acute cortical and subcortical infarct in the high right posterior frontal lobe.  Possible additional punctate infarct in the right parietal cortex versus artifact.  ECHO done results pending, vascular ultrasound lower extremities negative for DVT.  HbA1c 5.2, LDL 111. Pt Eval pending.  Pt is on Asprin 81 mg and Plavix 75 mg. He is NPO pending swallow eval.Talked to Pt about loop recorder. Pt agrees. Request for Loop recorder placed.  Vitals:   02/16/20 1215 02/16/20 1400 02/16/20 1500 02/16/20 1542  BP:  123/63 (!) 116/57 122/74  Pulse:  83 79 81  Resp:  15 (!) 21 16  Temp:   99 F (37.2 C) 98.7 F (37.1 C)  TempSrc:   Oral Oral  SpO2: 92% 94% 96% 90%  Weight:      Height:       CBC:  Recent Labs  Lab 02/15/20 1325 02/15/20 1333 02/16/20 0500  WBC 9.7  --  13.9*  NEUTROABS 6.3  --   --   HGB 18.3* 18.4* 16.9  HCT 55.2* 54.0* 48.6  MCV 86.4  --  85.7  PLT 192  --  132   Basic Metabolic Panel:  Recent Labs  Lab 02/15/20 1325 02/15/20 1333 02/16/20 0500  NA 139 139 137  K 4.4 3.9 3.7  CL 102 102 101  CO2 25  --  24  GLUCOSE 104* 104* 92  BUN 9 10 10   CREATININE 1.00 0.90 0.98  CALCIUM 9.6  --  8.8*   Lipid Panel:  Recent Labs  Lab 02/16/20 0500  CHOL 168  TRIG 80  HDL 41  CHOLHDL 4.1  VLDL 16  LDLCALC 111*   HgbA1c:  Recent Labs  Lab 02/16/20 0500  HGBA1C 5.2   Urine Drug Screen:  Recent Labs  Lab 02/15/20 1753  LABOPIA NONE DETECTED  COCAINSCRNUR NONE DETECTED  LABBENZ NONE DETECTED  AMPHETMU NONE  DETECTED  THCU NONE DETECTED  LABBARB NONE DETECTED    Alcohol Level No results for input(s): ETH in the last 168 hours.  IMAGING past 24 hours MR BRAIN WO CONTRAST  Result Date: 02/15/2020 CLINICAL DATA:  Stroke follow-up. EXAM: MRI HEAD WITHOUT CONTRAST TECHNIQUE: Multiplanar, multiecho pulse sequences of the brain and surrounding structures were obtained without intravenous contrast. COMPARISON:  None. FINDINGS: Brain: Acute cortical and subcortical infarct in the high right posterior frontal lobe no substantial edema or mass effect. Small focus of susceptibility artifact in this region may represent a small thrombosed distal cortical MCA branch (see series 18, image 40). There is an additional possible punctate focus of restricted diffusion in the right parietal cortex (see series 9, image 93 and series 10, image 39). Moderate to advanced additional scattered T2/FLAIR hyperintensities within the white matter, most likely related to chronic microvascular ischemic disease. No hydrocephalus. No acute hemorrhage. No extra-axial fluid collection. No abnormal mass lesion or abnormal mass effect. No midline shift. Basal cisterns are patent. Additional small focus of susceptibility artifact in the right parietal lobe may represent the sequela of prior  microhemorrhage. Vascular: Major arterial flow voids are maintained at the skull base. Skull and upper cervical spine: Normal marrow signal. Sinuses/Orbits: Largely clear. Other: No mastoid effusions. IMPRESSION: 1. Acute cortical and subcortical infarct in the high right posterior frontal lobe. Small focus of susceptibility artifact in this region may represent a small thrombosed distal cortical MCA branch. 2. Possible additional punctate infarct in the right parietal cortex versus artifact. 3. No substantial edema or mass effect. 4. Moderate to advanced chronic microvascular ischemic disease. These results will be called to the ordering clinician or  representative by the Radiologist Assistant, and communication documented in the PACS or [employer]. Electronically Signed   By: Margaretha Sheffield MD   On: 02/15/2020 18:44   VAS [place] LOWER EXTREMITY VENOUS (DVT)  Result Date: 02/16/2020  Lower Venous DVT Study Indications: Stroke.  Risk Factors: None identified. Comparison Study: No prior studies. Performing Technologist: Oliver Hum RVT  Examination Guidelines: A complete evaluation includes B-mode imaging, spectral Doppler, color Doppler, and power Doppler as needed of all accessible portions of each vessel. Bilateral testing is considered an integral part of a complete examination. Limited examinations for reoccurring indications may be performed as noted. The reflux portion of the exam is performed with the patient in reverse Trendelenburg.  +---------+---------------+---------+-----------+----------+--------------+ RIGHT    CompressibilityPhasicitySpontaneityPropertiesThrombus Aging +---------+---------------+---------+-----------+----------+--------------+ CFV      Full           Yes      Yes                                 +---------+---------------+---------+-----------+----------+--------------+ SFJ      Full                                                        +---------+---------------+---------+-----------+----------+--------------+ FV Prox  Full                                                        +---------+---------------+---------+-----------+----------+--------------+ FV Mid   Full                                                        +---------+---------------+---------+-----------+----------+--------------+ FV DistalFull                                                        +---------+---------------+---------+-----------+----------+--------------+ PFV      Full                                                         +---------+---------------+---------+-----------+----------+--------------+ POP      Full  Yes      Yes                                 +---------+---------------+---------+-----------+----------+--------------+ PTV      Full                                                        +---------+---------------+---------+-----------+----------+--------------+ PERO     Full                                                        +---------+---------------+---------+-----------+----------+--------------+   +---------+---------------+---------+-----------+----------+--------------+ LEFT     CompressibilityPhasicitySpontaneityPropertiesThrombus Aging +---------+---------------+---------+-----------+----------+--------------+ CFV      Full           Yes      Yes                                 +---------+---------------+---------+-----------+----------+--------------+ SFJ      Full                                                        +---------+---------------+---------+-----------+----------+--------------+ FV Prox  Full                                                        +---------+---------------+---------+-----------+----------+--------------+ FV Mid   Full                                                        +---------+---------------+---------+-----------+----------+--------------+ FV DistalFull                                                        +---------+---------------+---------+-----------+----------+--------------+ PFV      Full                                                        +---------+---------------+---------+-----------+----------+--------------+ POP      Full           Yes      Yes                                 +---------+---------------+---------+-----------+----------+--------------+ PTV  Full                                                         +---------+---------------+---------+-----------+----------+--------------+ PERO     Full                                                        +---------+---------------+---------+-----------+----------+--------------+     Summary: RIGHT: - There is no evidence of deep vein thrombosis in the lower extremity.  - No cystic structure found in the popliteal fossa.  LEFT: - There is no evidence of deep vein thrombosis in the lower extremity.  - No cystic structure found in the popliteal fossa.  *See table(s) above for measurements and observations.    Preliminary     PHYSICAL EXAM GENERAL: Awake, alert in NAD, Oriented x4, Knows month and year.  HEENT: - Normocephalic and atraumatic. LUNGS - Symmetrical chest rise, No labored breathing noted CV - no JVD, No Peripheral Edema ABDOMEN - Soft,  nondistended  Ext: warm, well perfused, intact peripheral pulses, no Peripheral edema.  NEURO Exam:   Mental Status: AA&Ox4. Oriented to self, age, place, situation. Knows month and year. Good Attention and Concentration Language: speech is slurred.  Pt is able to name simple objects, repetition intact,  fluency, and comprehension intact. Cranial Nerves:   CN II Pupils equal and reactive to light, no VF deficits    CN III,IV,VI EOM intact, no gaze preference or deviation, no nystagmus    CN V normal sensation in V1, V2, and V3 segments bilaterally    CN VII Rt sided facial droop present, no nasolabial fold flattening   CN VIII normal hearing to speech    CN IX & X normal palatal elevation, no uvular deviation    CN XI 5/5 head turn and 5/5 shoulder shrug bilaterally    CN XII midline tongue protrusion    Motor: Drift in Lt Upper Extremity, No Drift in LE's. Strength 5/5 in Rt UE, 4/5 in Lt UE with clumsiness, Weakness observed in Lt hand demonstrated by rapid finger tapping movements.  Strength in Rt LE  5/5, Lt LE 5/5 Tone: is normal and bulk is normal. Sensation- Intact to light touch  bilaterally. Coordination: Clumsiness present in FTN test on Lt side , no ataxia  Gait- deferred  ASSESSMENT/PLAN [name] is a 70 y.o. male with history of HLD, CAD with hx MI, COPD, depression, tobacco abuse, GERD, nephrolithiasis, hyperthyroidism, and CKD presenting with slurred speech, Rt facial droop  and Left arm weakness. NIHSS was 2.  CT Head normal. CTA Head and Neck revealed the nondominant right vertebral artery is diminutive throughout its course and likely occluded intradurally, Diminutive vertebrobasilar system intracranially with bilateral fetal type PCAs, anatomic variant. MRI showed Acute cortical and subcortical infarct in the high right posterior frontal lobe.  Possible additional punctate infarct in the right parietal cortex versus artifact. ECHO done results pending, vascular ultrasound lower extremities negative for DVT.  HbA1c 5.2, LDL 111. Pt Eval pending. Talked to Pt about loop recorder. Pt agrees. Request for Loop recorder placed. Pt on Asprin and plavix.   Stroke: Acute right MCA  cortical and subcortical infarcts, concerning for embolic pattern, source unclear  CT Code Stroke- No evidence of acute large vascular territory infarct.   CTA head & neck- The nondominant right vertebral artery is diminutive throughout its course and likely occluded intradurally. Diminutive vertebrobasilar system intracranially with bilateral fetal type PCAs, anatomic variant.   MRI -with 6 today is am not sure dilatation to the same thing cortical and subcortical infarct in the high right posterior frontal lobe. Small focus of susceptibility artifact in this region may represent a small thrombosed distal cortical MCA Branch. Possible additional punctate infarct in the right parietal cortex versus artifact.  2D Echo - pending  Doppler of lower extremities-  No DVT  Recommend loop recorder to rule out afib  LDL 111  HgbA1c 5.2  VTE prophylaxis - SCD's  aspirin 81 mg daily prior to  admission, now on aspirin 81 mg daily and clopidogrel 75 mg daily. Continue DAPT for 3 weeks and then plavix alone.  Therapy recommendations: Pending  Disposition: TBD  CAD with history of MI ? Arrhythmia   On ASA PTA  Now on DAPT for 3 weeks  Irregular heartbeat listed in the medical problems  Patient denies heart palpitation  Recommend loop recorder to rule out A. fib   Dysphagia  Speech on board  On dys1 and nectar thick liquid now  On IV fluids  No h/o Hypertension  Home meds: None  BP stable . Permissive hypertension systolic BP <267 but gradually normalize in 5-7 days . Long-term BP goal normotensive  Hyperlipidemia  Home meds: Lipitor 80 mg, resumed in hospital  LDL 111, goal < 70  Continue statin at discharge  CKD   Creatinine stable at 0.98  Tobacco  Use  Current every day smoker. 2 PPD x 59 years. 118 pack yr history.  Smoking cessation counseling provided Nicotine patch provided Pt is willing to quit  Other Stroke Risk Factors  Advanced Age >/= 50   Other Active Problems  Hypothyroidism- TSH- 0.919  Depression  Nephrolithiasis  BPH- Home meds include Flomax  COPD- Home meds include albuterol, Budesonide inhaler  Hospital day # 1   ATTENDING NOTE: I reviewed above note and agree with the assessment and plan. Pt was seen and examined.   70 year old male with history of BPH, CKD, CAD/MI status post stent, HLD, smoker presented with slurred speech, left facial droop, left sided weakness.  CT no acute abnormality.  CT head and neck unremarkable, bilateral fetal PCAs.  MRI showed right MCA cortical and subcortical infarcts.  2D echo pending.  LDL 111 and A1c 5.2.  UDS negative.  Creatinine 0.9.  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  On exam patient lethargic but awake alert, fully orientated.  No aphasia, follows simple commands, able to name and repeat.  Mild dysarthria.  Visual field full, no gaze preference.  Left facial  mild droop.  Tongue midline.  Left upper extremity pronator drift with decreased dexterity on the left hand.  Otherwise normal strength with right upper and bilateral lower extremities.  Sensation symmetrical.  Finger-to-nose grossly intact, although slow on the left.  Gait not tested  Patient stroke concerning for embolic pattern, source unclear.  Patient did have history of CAD/MI and questionable irregular heartbeat.  Recommend further embolic work-up with loop recorder to rule out A. fib.  Patient is in agreement to proceed.  Recommend aspirin 81 and Plavix 75 DAPT for 3 weeks and then Plavix alone.  Continue statin.  PT/OT pending.  Will follow.  Rosalin Hawking, MD PhD Stroke Neurology 02/16/2020 5:36 PM     To contact Stroke Continuity provider, please refer to [email or web address] After hours, contact General Neurology

## 2020-02-17 ENCOUNTER — Inpatient Hospital Stay (HOSPITAL_COMMUNITY): Payer: Medicare Other

## 2020-02-17 DIAGNOSIS — I252 Old myocardial infarction: Secondary | ICD-10-CM | POA: Diagnosis not present

## 2020-02-17 DIAGNOSIS — I69391 Dysphagia following cerebral infarction: Secondary | ICD-10-CM | POA: Diagnosis not present

## 2020-02-17 DIAGNOSIS — E7849 Other hyperlipidemia: Secondary | ICD-10-CM | POA: Diagnosis not present

## 2020-02-17 DIAGNOSIS — F17210 Nicotine dependence, cigarettes, uncomplicated: Secondary | ICD-10-CM | POA: Diagnosis not present

## 2020-02-17 DIAGNOSIS — Z720 Tobacco use: Secondary | ICD-10-CM | POA: Diagnosis not present

## 2020-02-17 DIAGNOSIS — I639 Cerebral infarction, unspecified: Secondary | ICD-10-CM | POA: Diagnosis not present

## 2020-02-17 DIAGNOSIS — I69322 Dysarthria following cerebral infarction: Secondary | ICD-10-CM

## 2020-02-17 DIAGNOSIS — I2510 Atherosclerotic heart disease of native coronary artery without angina pectoris: Secondary | ICD-10-CM | POA: Diagnosis not present

## 2020-02-17 DIAGNOSIS — I63411 Cerebral infarction due to embolism of right middle cerebral artery: Secondary | ICD-10-CM | POA: Diagnosis not present

## 2020-02-17 MED ORDER — CHLORHEXIDINE GLUCONATE 0.12 % MT SOLN
15.0000 mL | Freq: Two times a day (BID) | OROMUCOSAL | Status: DC
  Administered 2020-02-18 – 2020-02-24 (×12): 15 mL via OROMUCOSAL
  Filled 2020-02-17 (×11): qty 15

## 2020-02-17 MED ORDER — ORAL CARE MOUTH RINSE
15.0000 mL | Freq: Two times a day (BID) | OROMUCOSAL | Status: DC
  Administered 2020-02-17 – 2020-02-24 (×13): 15 mL via OROMUCOSAL

## 2020-02-17 MED ORDER — LORAZEPAM 2 MG/ML IJ SOLN
1.0000 mg | Freq: Once | INTRAMUSCULAR | Status: AC | PRN
  Administered 2020-02-17: 1 mg via INTRAVENOUS
  Filled 2020-02-17: qty 1

## 2020-02-17 NOTE — Progress Notes (Signed)
Rehab Admissions Coordinator Note:  Patient was screened by Cleatrice Burke for appropriateness for an Inpatient Acute Rehab Consult per PT change in recommendation from SNF to CIR today. Patient not yet at a level to participate in the intensity required of an inpt rehab admit. I will not place rehab consult at this time.   Cleatrice Burke RN MSN 02/17/2020, 12:36 PM  I can be reached at [phone].
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
## 2020-02-17 NOTE — Progress Notes (Signed)
Modified Barium Swallow Progress Note  Patient Details  Name: David Hendricks MRN: [id number] Date of Birth: June 24, 1950  Today's Date: 02/17/2020  Modified Barium Swallow completed.  Full report located under Chart Review in the Imaging Section.  Brief recommendations include the following:  Clinical Impression  Pt presents with a significant oropharyngeal dysphagia that is also exacerbated by current level of alertness. He is quite lethargic throughout testing, although alert enough to follow commands and accept boluses consistently. Limited trials were given in light of severity of dysphagia and mentation. He has limited lingual control of boluses, allowing a spoonful of nectar thick liquids to spill immediately back into his laryngeal vestibule. Once it is there, he tries to achieve some laryngeal closure, which is successful at expelling some of the bolus back out of his airway, but he does aspirate some of it. Pt also has very limited base of tongue retraction, hyolaryngeal movement, and pharyngeal squeeze, so that almost an entire bolus of puree remains in his valleculae post-swallow. He cannot clear this on his own despite cues, with SLP also providing suction via yankauer to clear as much as possible. As this pureed residue seems to thin out by mixing with secretions, he begins to aspirate this as well. Recommend that pt remain NPO for now with consideration of temporary, alternative means of nutrition with plans to retest swallowing function when more alert. MD in agreement with keeping pt NPO and also plans to involve palliative care.   Swallow Evaluation Recommendations       SLP Diet Recommendations: NPO;Alternative means - temporary     Medication Administration: Via alternative means   Oral Care Recommendations: Oral care QID   Other Recommendations: Have oral suction available    Osie Bond., M.A. [place] [facility] Pager [phone] Office  [phone]  02/17/2020,2:59 PM

## 2020-02-17 NOTE — Progress Notes (Signed)
  EP consulted for loop consideration.   Currently patient is agitated, yelling, and in soft restraints.   Unable to obtain consent for a procedure at this time.   We will follow along for improvement and discharge timing.   David Hendricks [address]" [place], PA-C  02/17/2020 10:24 AM

## 2020-02-17 NOTE — Progress Notes (Signed)
Physical Therapy Treatment Patient Details Name: David Hendricks MRN: [id number] DOB: [birth year] Today's Date: 02/17/2020    History of Present Illness Pt is a 70 y/o male admitted secondary to slurred speech and L sided weakness. 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 PMH includes tobacco use, CAD, COPD, anxiety, and depression.    PT Comments    Patient received in bed, cooperative with therapy and eager to get out of bed- repeatedly tells [place] he is claustrophobic. Speech slurred and difficult to understand at times. Remains impulsive and with poor safety awareness as well as slow processing and with poor awareness of deficits. Requires heavy 2 person assist for mobility and transfers in stedy due to heavy left lean with limited ability to correct. Fell asleep immediately once we got into the chair; noted O2 desat to 86-87% on room air and requires 2LPM to maintain above 90%. Left up in recliner with all needs met, chair alarm active at nursing station. Feel he would benefit from intensive therapies in CIR setting.    Follow Up Recommendations  CIR;Supervision/Assistance - 24 hour     Equipment Recommendations  Wheelchair cushion (measurements PT);Wheelchair (measurements PT);Hospital bed;Other (comment) (hoyer lift)    Recommendations for Other Services       Precautions / Restrictions Precautions Precautions: Fall;Other (comment) Precaution Comments: can be agitated and combative- getting OOB and up to nurse station in recliner seems to help (claustrophobic), L weakness and lean Restrictions Weight Bearing Restrictions: No    Mobility  Bed Mobility Overal bed mobility: Needs Assistance Bed Mobility: Supine to Sit     Supine to sit: Min assist;+2 for physical assistance     General bed mobility comments: MinAx2 for safety getting to EOB with HOB elevated and use of rails; needed most assistance getting trunk up to midline  Transfers Overall transfer level: Needs  assistance Equipment used: Ambulation equipment used Transfers: Sit to/from Stand Sit to Stand: +2 physical assistance;Max assist;From elevated surface         General transfer comment: MaxAx2 to boost up to standing in the steady, heavy left lean with poor awareness of midline once up; pivoted into recliner using stedy  Ambulation/Gait             General Gait Details: unable   Stairs             Wheelchair Mobility    Modified Rankin (Stroke Patients Only)       Balance Overall balance assessment: Needs assistance Sitting-balance support: Bilateral upper extremity supported;Feet supported Sitting balance-Leahy Scale: Poor Sitting balance - Comments: posterior lean Postural control: Posterior lean Standing balance support: Bilateral upper extremity supported;During functional activity Standing balance-Leahy Scale: Poor Standing balance comment: heavy left lean with limited ability to correct                            Cognition Arousal/Alertness: Awake/alert Behavior During Therapy: Flat affect;Impulsive Overall Cognitive Status: No family/caregiver present to determine baseline cognitive functioning                                 General Comments: hx of being very agitated/combative with nursing, OK with therapy but with decreased awareness of safety/deficits and not really oriented to situation. Slow processing.      Exercises      General Comments General comments (skin integrity, edema, etc.): desats to 86-87% on  room air, requires 2LPM to sat at greater than 90%      Pertinent Vitals/Pain Pain Assessment: Faces Faces Pain Scale: Hurts a little bit Pain Location: generalized discomfort, grimacing with suctioning Pain Descriptors / Indicators: Discomfort Pain Intervention(s): Limited activity within patient's tolerance;Monitored during session;Repositioned    Home Living                      Prior Function             PT Goals (current goals can now be found in the care plan section) Acute Rehab PT Goals Patient Stated Goal: none stated PT Goal Formulation: With patient Time For Goal Achievement: 03/01/20 Potential to Achieve Goals: Fair Progress towards PT goals: Progressing toward goals    Frequency    Min 3X/week      PT Plan Discharge plan needs to be updated;Equipment recommendations need to be updated    Co-evaluation              AM-PAC PT "6 Clicks" Mobility   Outcome Measure  Help needed turning from your back to your side while in a flat bed without using bedrails?: A Little Help needed moving from lying on your back to sitting on the side of a flat bed without using bedrails?: A Lot Help needed moving to and from a bed to a chair (including a wheelchair)?: A Lot Help needed standing up from a chair using your arms (e.g., wheelchair or bedside chair)?: A Lot Help needed to walk in hospital room?: Total Help needed climbing 3-5 steps with a railing? : Total 6 Click Score: 11    End of Session Equipment Utilized During Treatment: Gait belt Activity Tolerance: Patient tolerated treatment well Patient left: in chair;with call bell/phone within reach;with chair alarm set;with restraints reapplied (at nurses station) Nurse Communication: Mobility status PT Visit Diagnosis: Unsteadiness on feet (R26.81);Muscle weakness (generalized) (M62.81);Difficulty in walking, not elsewhere classified (R26.2);Hemiplegia and hemiparesis Hemiplegia - Right/Left: Left Hemiplegia - caused by: Cerebral infarction     Time: 1000-1032 PT Time Calculation (min) (ACUTE ONLY): 32 min  Charges:  $Therapeutic Activity: 8-22 mins (co-tx with OT)                     Windell Norfolk, DPT, PN1   Supplemental Physical Therapist [place]    Pager [phone] Acute Rehab Office [phone]

## 2020-02-17 NOTE — Progress Notes (Signed)
Pt continues to be very restless and agitated. Pt states that he wants to leave. Pt attempting to remove restraints. MD paged, new orders placed.

## 2020-02-17 NOTE — Progress Notes (Signed)
Progress Note    David Hendricks  ESP:[id number] DOB: [birth year]  DOA: 02/15/2020 PCP: Horald Pollen, MD    Brief Narrative:   Chief complaint: Slurred speech and left-sided weakness.  Medical records reviewed and are as summarized below:  David Hendricks is an 70 y.o. male with a PMH of tobacco abuse 2 pack a day, COPD, CKD, CAD, HLD, hyperthyroidism, thyroid nodule, BPH, anxiety and depression, who presented to the ED via EMS with slurred speech and left-sided weakness. CODE STROKE activated. In the ED, evaluated by neurologist who noted dysarthria and a left facial droop. 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  Assessment/Plan:   Principal Problem:  Acute CVA (cerebrovascular accident) with moderate dysarthria, left facial weakness and dysphagia POA Admitted to telemetry. Stroke service consulted. tPA not given because patient outside of normal window. CT head showed no evidence of acute large vascular territory infarct. 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 CTA of head/neck showed nondominant right vertebral artery is diminutive throughout its course and likely occluded intradurally. Continue aspirin daily. FLP showed a cholesterol of 168, LDL 111. Continue statin.  HgbA1c 5.2%. 2 D Echocardiogram showed EF 63-33%, grade I diastolic dysfunction and mild LVH. Diet advanced to dysphagia I s/p ST evaluation, but now downgraded back to NPO. RN to counsel on smoking cessation. PT recommending SNF. Doubt will consent to placement, but is not capable of making decision (realistically unable to care for himself,  lived alone prior to admission, unable to appreciate risks of leaving AMA).  TOC consulted.   Active Problems:   Agitation Has required restraint for safety and Ativan for sedation. Attempting to leave the hospital and lacks any insight into current deficits. Palliative care consulted. Lacks capacity.     Hyperbilirubinemia Mild, unclear etiology. Outpatient work up recommended.    Tobacco abuse, POA RN to provide tobacco cessation counseling. Continue nicoderm patch 21 mg (smokes 2 PPD).    Hyperlipidemia, POA FLP showed a cholesterol of 168, LDL 111. Continue statin.    CAD (coronary artery disease) with h/o MI and aortic atherosclerosis, POA Continue ASA/statin.    Subclinical hyperthyroidism/thyroid mass/multinodular goiter, POA Continue methimazole.    Pulmonary emphysema (HCC) Continue PRN albuterol. Not taking symbicort anymore.    Anxiety and depression Continue Desyrel Q HS.    BPH, POA Continue Flomax.    Obesity, chronic, POA  Body mass index is 29.03 kg/m.   Progression: Sister tells me he has no close family that lives near-by and she lives in [place]. He has no children. Palliative care consulted to assist with goals of care and disposition. Sister confirms he will likely refuse SNF, but lacks capacity.     Family Communication/Anticipated D/C date and plan/Code Status   DVT prophylaxis: SCD's Start: 02/15/20 1620  Current Level of Care:: Telemetry Medical Code Status: Full Code.  Family Communication: Sister updated by telephone. Disposition Plan: Status is: Inpatient  Remains inpatient appropriate because:Inpatient level of care appropriate due to severity of illness: Acute CVA with deficits. Needs PT/OT/ST evaluations, likely will need a SNF.   Dispo: The patient is from: Home  Anticipated d/c is to: SNF              Anticipated d/c date is: 2 days              Patient currently is not medically stable to d/c.   Difficult to place patient  No   Medical Consultants:    Neurology  Palliative care.   Anti-Infectives:    None  Subjective:   Agitated through the night, required restraint for safety. Follows come commands but remains very impulsive and confused.  Thought he was in [place] today. Nurses had him up at the nurse's station to try to keep him calm.   Objective:    Vitals:   02/16/20 1811 02/16/20 2010 02/17/20 0005 02/17/20 0600  BP: (!) 159/77 125/76 (!) 150/73 (!) 144/65  Pulse: (!) 109 89 (!) 101 85  Resp: 20 20 19 20   Temp: 98.4 F (36.9 C) 98.1 F (36.7 C) 98.6 F (37 C) 98.4 F (36.9 C)  TempSrc: Oral Oral Oral Axillary  SpO2: 93% 93% 93% 94%  Weight:      Height:        Intake/Output Summary (Last 24 hours) at 02/17/2020 0807 Last data filed at 02/17/2020 0749 Gross per 24 hour  Intake 855.18 ml  Output 300 ml  Net 555.18 ml   Filed Weights   02/15/20 1355  Weight: 99.8 kg    Exam: General: Sedated, has been in 4 point soft restraints for safety, but OT working with him now. Cardiovascular: Heart sounds show a regular rate, and rhythm. No gallops or rubs. No murmurs. No JVD. Lungs: Occasional rhonchi. Decreased breath sounds. Abdomen: Soft, nontender, nondistended with normal active bowel sounds. No masses. No hepatosplenomegaly. Neurological: Lethargic, disoriented. Thinks he is in [place], but knows it is Feb/2022. Moves all extremities but decreased strength on left. Has dysarthria and dysphagia. Skin: Warm and dry. No rashes or lesions. Extremities: No clubbing or cyanosis. No edema. Pedal pulses 2+. Psychiatric: Mood and affect are flat. Insight and judgment are poor.     Data Reviewed:   I have personally reviewed following labs and imaging studies:  Labs: Labs show the following:   Basic Metabolic Panel: Recent Labs  Lab 02/15/20 1325 02/15/20 1333 02/16/20 0500  NA 139 139 137  K 4.4 3.9 3.7  CL 102 102 101  CO2 25  --  24  GLUCOSE 104* 104* 92  BUN 9  10 10   CREATININE 1.00 0.90 0.98  CALCIUM 9.6  --  8.8*   GFR Estimated Creatinine Clearance: 87.2 mL/min (by C-G formula based on SCr of 0.98 mg/dL). Liver Function Tests: Recent Labs  Lab 02/15/20 1325 02/16/20 0500  AST 33 24  ALT 18 17  ALKPHOS 106 95  BILITOT 1.5* 1.5*  PROT 6.7 6.3*  ALBUMIN 3.6 3.5   Coagulation profile Recent Labs  Lab 02/15/20 1325  INR 0.9    CBC: Recent Labs  Lab 02/15/20 1325 02/15/20 1333 02/16/20 0500  WBC 9.7  --  13.9*  NEUTROABS 6.3  --   --   HGB 18.3* 18.4* 16.9  HCT 55.2* 54.0* 48.6  MCV 86.4  --  85.7  PLT 192  --  170   Cardiac Enzymes: Recent Labs  Lab 02/15/20 1815  CKTOTAL 62   CBG: Recent Labs  Lab 02/15/20 1408  GLUCAP 96   Hgb A1c: Recent Labs    02/16/20 0500  HGBA1C 5.2   Lipid Profile: Recent Labs  02/16/20 0500  CHOL 168  HDL 41  LDLCALC 111*  TRIG 80  CHOLHDL 4.1   Thyroid function studies: Recent Labs    02/15/20 1518  TSH 0.919   Microbiology Recent Results (from the past 240 hour(s))  SARS CORONAVIRUS 2 (TAT 6-24 HRS) Nasopharyngeal Nasopharyngeal Swab     Status: None   Collection Time: 02/15/20  2:26 PM   Specimen: Nasopharyngeal Swab  Result Value Ref Range Status   SARS Coronavirus 2 NEGATIVE NEGATIVE Final    Comment: (NOTE) SARS-CoV-2 target nucleic acids are NOT DETECTED.  The SARS-CoV-2 RNA is generally detectable in upper and lower respiratory specimens during the acute phase of infection. Negative results do not preclude SARS-CoV-2 infection, do not rule out co-infections with other pathogens, and should not be used as the sole basis for treatment or other patient management decisions. Negative results must be combined with clinical observations, patient history, and epidemiological information. The expected result is Negative.  Fact Sheet for Patients: SugarRoll.be  Fact Sheet for Healthcare  Providers: https://www.woods-mathews.com/  This test is not yet approved or cleared by the [place] FDA and  has been authorized for detection and/or diagnosis of SARS-CoV-2 by FDA under an Emergency Use Authorization (EUA). This EUA will remain  in effect (meaning this test can be used) for the duration of the COVID-19 declaration under Se ction 564(b)(1) of the Act, 21 U.S.C. section 360bbb-3(b)(1), unless the authorization is terminated or revoked sooner.  Performed at [facility], [place] [address], [place], [place] [address]     Procedures and diagnostic studies:  MR BRAIN WO CONTRAST  Result Date: 02/15/2020 CLINICAL DATA:  Stroke follow-up. EXAM: MRI HEAD WITHOUT CONTRAST TECHNIQUE: Multiplanar, multiecho pulse sequences of the brain and surrounding structures were obtained without intravenous contrast. COMPARISON:  None. FINDINGS: Brain: Acute cortical and subcortical infarct in the high right posterior frontal lobe no substantial edema or mass effect. Small focus of susceptibility artifact in this region may represent a small thrombosed distal cortical MCA branch (see series 18, image 40). There is an additional possible punctate focus of restricted diffusion in the right parietal cortex (see series 9, image 93 and series 10, image 39). Moderate to advanced additional scattered T2/FLAIR hyperintensities within the white matter, most likely related to chronic microvascular ischemic disease. No hydrocephalus. No acute hemorrhage. No extra-axial fluid collection. No abnormal mass lesion or abnormal mass effect. No midline shift. Basal cisterns are patent. Additional small focus of susceptibility artifact in the right parietal lobe may represent the sequela of prior microhemorrhage. Vascular: Major arterial flow voids are maintained at the skull base. Skull and upper cervical spine: Normal marrow signal. Sinuses/Orbits: Largely clear. Other: No mastoid effusions.  IMPRESSION: 1. Acute cortical and subcortical infarct in the high right posterior frontal lobe. Small focus of susceptibility artifact in this region may represent a small thrombosed distal cortical MCA branch. 2. Possible additional punctate infarct in the right parietal cortex versus artifact. 3. No substantial edema or mass effect. 4. Moderate to advanced chronic microvascular ischemic disease. These results will be called to the ordering clinician or representative by the Radiologist Assistant, and communication documented in the PACS or [employer]. Electronically Signed   By: Margaretha Sheffield MD   On: 02/15/2020 18:44   DG Chest Port 1 View  Result Date: 02/15/2020 CLINICAL DATA:  Cough. EXAM: PORTABLE CHEST 1 VIEW COMPARISON:  April 23, 2019. FINDINGS: The heart size and mediastinal contours are within normal limits. Both lungs  are clear. The visualized skeletal structures are unremarkable. IMPRESSION: No active disease. Electronically Signed   By: Marijo Conception M.D.   On: 02/15/2020 15:43   ECHOCARDIOGRAM COMPLETE  Result Date: 02/16/2020    ECHOCARDIOGRAM REPORT   Patient Name:   JAZPER NIKOLAI Date of Exam: 02/16/2020 Medical Rec #:  [id number]  Height:       73.0 in Accession #:    [id number] Weight:       220.0 lb Date of Birth:  Apr 17, 1950  BSA:          2.241 m Patient Age:    79 years   BP:           116/57 mmHg Patient Gender: M          HR:           60 bpm. Exam Location:  Inpatient Procedure: 2D Echo, Cardiac Doppler and Color Doppler Indications:    Stroke  History:        Patient has prior history of Echocardiogram examinations, most                 recent 01/24/2017. Previous Myocardial Infarction and CAD,                 Stroke, Signs/Symptoms:Chest Pain; Risk Factors:Current Smoker.  Sonographer:    Roseanna Rainbow RDCS Referring Phys: [id number] NA LI  Sonographer Comments: Technically difficult study due to poor echo windows. IMPRESSIONS  1. Left ventricular ejection fraction, by estimation, is  60 to 65%. The left ventricle has normal function. The left ventricle has no regional wall motion abnormalities. There is mild left ventricular hypertrophy. Left ventricular diastolic parameters are consistent with Grade I diastolic dysfunction (impaired relaxation).  2. Right ventricular systolic function is mildly reduced. The right ventricular size is mildly enlarged. Tricuspid regurgitation signal is inadequate for assessing PA pressure.  3. The mitral valve is normal in structure. Trivial mitral valve regurgitation. No evidence of mitral stenosis.  4. The aortic valve is tricuspid. Aortic valve regurgitation is not visualized. No aortic stenosis is present.  5. Aortic dilatation noted. There is mild dilatation of the ascending aorta, measuring 40 mm.  6. The inferior vena cava is normal in size with greater than 50% respiratory variability, suggesting right atrial pressure of 3 mmHg. FINDINGS  Left Ventricle: Left ventricular ejection fraction, by estimation, is 60 to 65%. The left ventricle has normal function. The left ventricle has no regional wall motion abnormalities. The left ventricular internal cavity size was normal in size. There is  mild left ventricular hypertrophy. Left ventricular diastolic parameters are consistent with Grade I diastolic dysfunction (impaired relaxation). Right Ventricle: The right ventricular size is mildly enlarged. No increase in right ventricular wall thickness. Right ventricular systolic function is mildly reduced. Tricuspid regurgitation signal is inadequate for assessing PA pressure. Left Atrium: Left atrial size was normal in size. Right Atrium: Right atrial size was normal in size. Pericardium: Trivial pericardial effusion is present. Mitral Valve: The mitral valve is normal in structure. Trivial mitral valve regurgitation. No evidence of mitral valve stenosis. Tricuspid Valve: The tricuspid valve is normal in structure. Tricuspid valve regurgitation is not demonstrated.  Aortic Valve: The aortic valve is tricuspid. Aortic valve regurgitation is not visualized. No aortic stenosis is present. Pulmonic Valve: The pulmonic valve was normal in structure. Pulmonic valve regurgitation is not visualized. Aorta: Aortic dilatation noted. There is mild dilatation of the ascending aorta, measuring 40 mm. Venous: The inferior vena  cava is normal in size with greater than 50% respiratory variability, suggesting right atrial pressure of 3 mmHg. IAS/Shunts: No atrial level shunt detected by color flow Doppler.  LEFT VENTRICLE PLAX 2D LVIDd:         4.40 cm      Diastology LVIDs:         2.80 cm      LV e' medial:    7.29 cm/s LV PW:         1.30 cm      LV E/e' medial:  11.1 LV IVS:        1.50 cm      LV e' lateral:   12.40 cm/s LVOT diam:     1.80 cm      LV E/e' lateral: 6.5 LV SV:         46 LV SV Index:   20 LVOT Area:     2.54 cm  LV Volumes (MOD) LV vol d, MOD A2C: 36.9 ml LV vol d, MOD A4C: 120.0 ml LV vol s, MOD A2C: 11.2 ml LV vol s, MOD A4C: 36.3 ml LV SV MOD A2C:     25.7 ml LV SV MOD A4C:     120.0 ml LV SV MOD BP:      46.2 ml RIGHT VENTRICLE RV S prime:     9.03 cm/s TAPSE (M-mode): 1.4 cm LEFT ATRIUM             Index       RIGHT ATRIUM          Index LA diam:        3.75 cm 1.67 cm/m  RA Area:     8.46 cm LA Vol (A2C):   13.5 ml 6.03 ml/m  RA Volume:   19.90 ml 8.88 ml/m LA Vol (A4C):   23.9 ml 10.67 ml/m LA Biplane Vol: 19.8 ml 8.84 ml/m  AORTIC VALVE LVOT Vmax:   75.10 cm/s LVOT Vmean:  49.700 cm/s LVOT VTI:    0.180 m  AORTA Ao Root diam: 3.60 cm Ao Asc diam:  4.00 cm MITRAL VALVE MV Area (PHT): 3.27 cm    SHUNTS MV Decel Time: 232 msec    Systemic VTI:  0.18 m MV E velocity: 80.60 cm/s  Systemic Diam: 1.80 cm MV A velocity: 82.70 cm/s MV E/A ratio:  0.97 Loralie Champagne MD Electronically signed by Loralie Champagne MD Signature Date/Time: 02/16/2020/3:57:28 PM    Final    CT HEAD CODE STROKE WO CONTRAST  Result Date: 02/15/2020 CLINICAL DATA:  Code stroke. Left-sided  weakness. Facial droop and slurred speech. EXAM: CT HEAD WITHOUT CONTRAST TECHNIQUE: Contiguous axial images were obtained from the base of the skull through the vertex without intravenous contrast. COMPARISON:  December 31, 2019. FINDINGS: Brain: No evidence of acute large vascular territory infarct. There is moderate to advanced patchy white matter hypoattenuation, nonspecific but most likely related to chronic microvascular ischemic disease. No acute hemorrhage. Moderate diffuse cerebral atrophy with ex vacuo ventricular dilation. No mass lesion or abnormal mass effect. No extra-axial fluid collection. Vascular: No definite hyperdense vessel. Mild diffuse hyperdensity of the dural sinuses and vessels appears similar to prior. Skull: No acute fracture. Sinuses/Orbits: Sinuses are largely clear. Other: No mastoid effusions. ASPECTS [facility] Stroke Program Early CT Score) Total score (0-10 with 10 being normal): 10 IMPRESSION: 1. No evidence of acute large vascular territory infarct.ASPECTS is 10. 2. Moderate to advanced patchy white matter hypoattenuation appears similar to prior and  presumably related to chronic microvascular ischemic disease. A small white matter infarct could be obscured and MRI could better evaluate for acute ischemia if clinically indicated. Code stroke imaging results were communicated on 02/15/2020 at 1:45 pm to provider Dr. Theda Sers Via telephone, who verbally acknowledged these results. Electronically Signed   By: Margaretha Sheffield MD   On: 02/15/2020 13:48   VAS [place] LOWER EXTREMITY VENOUS (DVT)  Result Date: 02/16/2020  Lower Venous DVT Study Indications: Stroke.  Risk Factors: None identified. Comparison Study: No prior studies. Performing Technologist: Oliver Hum RVT  Examination Guidelines: A complete evaluation includes B-mode imaging, spectral Doppler, color Doppler, and power Doppler as needed of all accessible portions of each vessel. Bilateral testing is considered an integral  part of a complete examination. Limited examinations for reoccurring indications may be performed as noted. The reflux portion of the exam is performed with the patient in reverse Trendelenburg.  +---------+---------------+---------+-----------+----------+--------------+ RIGHT    CompressibilityPhasicitySpontaneityPropertiesThrombus Aging +---------+---------------+---------+-----------+----------+--------------+ CFV      Full           Yes      Yes                                 +---------+---------------+---------+-----------+----------+--------------+ SFJ      Full                                                        +---------+---------------+---------+-----------+----------+--------------+ FV Prox  Full                                                        +---------+---------------+---------+-----------+----------+--------------+ FV Mid   Full                                                        +---------+---------------+---------+-----------+----------+--------------+ FV DistalFull                                                        +---------+---------------+---------+-----------+----------+--------------+ PFV      Full                                                        +---------+---------------+---------+-----------+----------+--------------+ POP      Full           Yes      Yes                                 +---------+---------------+---------+-----------+----------+--------------+ PTV      Full                                                        +---------+---------------+---------+-----------+----------+--------------+  PERO     Full                                                        +---------+---------------+---------+-----------+----------+--------------+   +---------+---------------+---------+-----------+----------+--------------+ LEFT     CompressibilityPhasicitySpontaneityPropertiesThrombus Aging  +---------+---------------+---------+-----------+----------+--------------+ CFV      Full           Yes      Yes                                 +---------+---------------+---------+-----------+----------+--------------+ SFJ      Full                                                        +---------+---------------+---------+-----------+----------+--------------+ FV Prox  Full                                                        +---------+---------------+---------+-----------+----------+--------------+ FV Mid   Full                                                        +---------+---------------+---------+-----------+----------+--------------+ FV DistalFull                                                        +---------+---------------+---------+-----------+----------+--------------+ PFV      Full                                                        +---------+---------------+---------+-----------+----------+--------------+ POP      Full           Yes      Yes                                 +---------+---------------+---------+-----------+----------+--------------+ PTV      Full                                                        +---------+---------------+---------+-----------+----------+--------------+ PERO     Full                                                        +---------+---------------+---------+-----------+----------+--------------+  Summary: RIGHT: - There is no evidence of deep vein thrombosis in the lower extremity.  - No cystic structure found in the popliteal fossa.  LEFT: - There is no evidence of deep vein thrombosis in the lower extremity.  - No cystic structure found in the popliteal fossa.  *See table(s) above for measurements and observations. Electronically signed by Jamelle Haring on 02/16/2020 at 5:03:07 PM.    Final    CT ANGIO HEAD CODE STROKE  Result Date: 02/15/2020 CLINICAL DATA:  Speech difficulty and  left-sided weakness. EXAM: CT ANGIOGRAPHY HEAD AND NECK TECHNIQUE: Multidetector CT imaging of the head and neck was performed using the standard protocol during bolus administration of intravenous contrast. Multiplanar CT image reconstructions and MIPs were obtained to evaluate the vascular anatomy. Carotid stenosis measurements (when applicable) are obtained utilizing NASCET criteria, using the distal internal carotid diameter as the denominator. CONTRAST:  69mL OMNIPAQUE IOHEXOL 350 MG/ML SOLN COMPARISON:  Same day CT head. FINDINGS: CTA NECK FINDINGS Aortic arch: Calcific atherosclerosis of the aorta. Great vessel origins are patent. Right carotid system: No evidence of dissection, stenosis (50% or greater) or occlusion. Left carotid system: No evidence of dissection, stenosis (50% or greater) or occlusion. Vertebral arteries: Left dominant. The right vertebral artery is diminutive throughout its course. No evidence of significant focal stenosis in the neck. Skeleton: Mild multilevel degenerative change of the cervical spine. Other neck: Enlarged, heterogeneous thyroid with numerous nodules and retrosternal extension. Upper chest: Paraseptal and centrilobular emphysema. No focal consolidation. Review of the MIP images confirms the above findings CTA HEAD FINDINGS Anterior circulation: No significant stenosis, proximal occlusion, aneurysm, or vascular malformation. 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 The left intradural vertebral artery is patent and without evidence of significant stenosis. Diminutive vertebrobasilar system with bilateral fetal type PCAs. Bilateral posterior cerebral arteries are patent without proximal hemodynamically significant stenosis. Evaluation of the distal PCAs is limited by venous contamination. Venous sinuses: As permitted by contrast timing, patent. Review of the MIP images confirms the above findings IMPRESSION:  1. 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 2. Otherwise, no large vessel occlusion or proximal hemodynamically significant stenosis in the head or neck 3. Diminutive vertebrobasilar system intracranially with bilateral fetal type PCAs, anatomic variant. 4. Enlarged, heterogeneous thyroid with numerous nodules and retrosternal extension. This may reflect goiter; however, recommend thyroid ultrasound to further evaluate. 5. Paraseptal and centrilobular emphysema. Findings were communicated on 02/15/2020 at 1:45 pm to provider Dr. Theda Sers Via telephone, who verbally acknowledged these results. Electronically Signed   By: Margaretha Sheffield MD   On: 02/15/2020 14:00   CT ANGIO NECK CODE STROKE  Result Date: 02/15/2020 CLINICAL DATA:  Speech difficulty and left-sided weakness. EXAM: CT ANGIOGRAPHY HEAD AND NECK TECHNIQUE: Multidetector CT imaging of the head and neck was performed using the standard protocol during bolus administration of intravenous contrast. Multiplanar CT image reconstructions and MIPs were obtained to evaluate the vascular anatomy. Carotid stenosis measurements (when applicable) are obtained utilizing NASCET criteria, using the distal internal carotid diameter as the denominator. CONTRAST:  33mL OMNIPAQUE IOHEXOL 350 MG/ML SOLN COMPARISON:  Same day CT head. FINDINGS: CTA NECK FINDINGS Aortic arch: Calcific atherosclerosis of the aorta. Great vessel origins are patent. Right carotid system: No evidence of dissection, stenosis (50% or greater) or occlusion. Left carotid system: No evidence of dissection, stenosis (50% or greater) or occlusion. Vertebral arteries: Left dominant. The right vertebral artery is  diminutive throughout its course. No evidence of significant focal stenosis in the neck. Skeleton: Mild multilevel degenerative change of the cervical spine. Other neck: Enlarged, heterogeneous thyroid with numerous nodules and  retrosternal extension. Upper chest: Paraseptal and centrilobular emphysema. No focal consolidation. Review of the MIP images confirms the above findings CTA HEAD FINDINGS Anterior circulation: No significant stenosis, proximal occlusion, aneurysm, or vascular malformation. 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 The left intradural vertebral artery is patent and without evidence of significant stenosis. Diminutive vertebrobasilar system with bilateral fetal type PCAs. Bilateral posterior cerebral arteries are patent without proximal hemodynamically significant stenosis. Evaluation of the distal PCAs is limited by venous contamination. Venous sinuses: As permitted by contrast timing, patent. Review of the MIP images confirms the above findings IMPRESSION: 1. 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 2. Otherwise, no large vessel occlusion or proximal hemodynamically significant stenosis in the head or neck 3. Diminutive vertebrobasilar system intracranially with bilateral fetal type PCAs, anatomic variant. 4. Enlarged, heterogeneous thyroid with numerous nodules and retrosternal extension. This may reflect goiter; however, recommend thyroid ultrasound to further evaluate. 5. Paraseptal and centrilobular emphysema. Findings were communicated on 02/15/2020 at 1:45 pm to provider Dr. Theda Sers Via telephone, who verbally acknowledged these results. Electronically Signed   By: Margaretha Sheffield MD   On: 02/15/2020 14:00    Medications:   .  stroke: mapping our early stages of recovery book   Does not apply Once  . aspirin EC  81 mg Oral Daily  . atorvastatin  80 mg Oral Daily  . clopidogrel  75 mg Oral Daily  . methimazole  5 mg Oral Daily  . nicotine  21 mg Transdermal Daily  . tamsulosin  0.4 mg Oral Daily  . traZODone  50 mg Oral QHS   Continuous Infusions: . sodium chloride  100 mL/hr at 02/16/20 1435     LOS: 2 days   Jacquelynn Cree, MD  [facility]   [facility] How to contact the [facility] Attending or Consulting provider [place] or covering provider during after hours [place], for this patient?  1. Check the care team in [facility] and look for a) attending/consulting [facility] provider listed and b) the [facility] team listed 2. Log into [email or web address] and use [place]'s universal password to access. If you do not have the password, please contact the hospital operator. 3. Locate the [facility] provider you are looking for under [facility] and page to a number that you can be directly reached. 4. If you still have difficulty reaching the provider, please page the [facility] (Director on Call) for the Hospitalists listed on amion for assistance.  02/17/2020, 8:07 AM

## 2020-02-17 NOTE — Progress Notes (Signed)
Mr. Rack friend Jeneen Rinks) came to bring him some of his belongings from home. He brought a bag of clothes and the patient's laptop computer and charger. I advised the patient on being responsible to his own belongings.

## 2020-02-17 NOTE — Progress Notes (Signed)
Occupational Therapy Treatment Patient Details Name: David Hendricks MRN: [id number] DOB: December 12, 1950 Today's Date: 02/17/2020    History of present illness Pt is a 70 y/o male admitted secondary to slurred speech and L sided weakness. 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 PMH includes tobacco use, CAD, COPD, anxiety, and depression.   OT comments  Assist for transitioning pt from recliner to bed due to MBS study this PM. Pt following simple commands, but very lethargic at this time. Pt requiring maxA +2 for L lateral lean and weakness on L side. Pt's LUE assisted to stedy handle to push from and grip strength, WFLs once positioned. Pt sit to stand x2 times with maxA +2. Pt maxA +2 for bed mobility back to bed due to lethargy and weakness. Pt would benefit from continued OT skilled services for ADL, mobility and safety in CIR setting. OT following acutely.    Follow Up Recommendations  CIR    Equipment Recommendations  3 in 1 bedside commode;Wheelchair (measurements OT);Wheelchair cushion (measurements OT)    Recommendations for Other Services Rehab consult    Precautions / Restrictions Precautions Precautions: Fall;Other (comment) Precaution Comments: can be agitated and combative- getting OOB and up to nurse station in recliner seems to help (claustrophobic), L weakness and lean Restrictions Weight Bearing Restrictions: No       Mobility Bed Mobility Overal bed mobility: Needs Assistance Bed Mobility: Sit to Supine     Supine to sit: Min assist;+2 for physical assistance Sit to supine: Max assist;+2 for physical assistance   General bed mobility comments: maxA+2 for trunk elevation and BLE movement  Transfers Overall transfer level: Needs assistance Equipment used: Ambulation equipment used Transfers: Sit to/from Stand Sit to Stand: +2 physical assistance;Max assist         General transfer comment: MaxA+2 to power up to standing in the steady, heavy lean to with  poor awareness of midline once up; recliner to bed with stedy +2 assist    Balance Overall balance assessment: Needs assistance Sitting-balance support: Bilateral upper extremity supported;Feet supported Sitting balance-Leahy Scale: Poor Sitting balance - Comments: posterior and left lean Postural control: Posterior lean;Left lateral lean Standing balance support: Bilateral upper extremity supported;During functional activity Standing balance-Leahy Scale: Poor Standing balance comment: heavy left lean with limited ability to correct                           ADL either performed or assessed with clinical judgement   ADL Overall ADL's : Needs assistance/impaired Eating/Feeding: NPO   Grooming: Maximal assistance;Wash/dry face;Sitting Grooming Details (indicate cue type and reason): washing face Upper Body Bathing: Maximal assistance;Sitting   Lower Body Bathing: Total assistance;Cueing for safety;Sitting/lateral leans;Sit to/from stand   Upper Body Dressing : Moderate assistance;Sitting Upper Body Dressing Details (indicate cue type and reason): donning gown Lower Body Dressing: Total assistance;Sitting/lateral leans;Sit to/from stand;Cueing for safety   Toilet Transfer: Maximal assistance;+2 for physical assistance;+2 for safety/equipment;Cueing for safety;Cueing for sequencing Toilet Transfer Details (indicate cue type and reason): with larger stedy Toileting- Clothing Manipulation and Hygiene: Total assistance;Sitting/lateral lean;Sit to/from stand;Cueing for safety Toileting - Clothing Manipulation Details (indicate cue type and reason): condom catheter on     Functional mobility during ADLs: Maximal assistance;+2 for physical assistance;+2 for safety/equipment;Cueing for safety;Cueing for sequencing General ADL Comments: Session focused on getting pt back to bed due to MBS this PM. Pt requiring cues to follow for sequencing through task. Pt very lethargic and  once  lying in bed, pt was fast asleep.     Vision Baseline Vision/History: Wears glasses Patient Visual Report: Other (comment) (appears to have visual deficit in L lateral visual field and possible R medial visual view.) Vision Assessment?: Vision impaired- to be further tested in functional context;Yes Eye Alignment: Within Functional Limits Ocular Range of Motion: Restricted on the left;Impaired-to be further tested in functional context Alignment/Gaze Preference: Gaze right Additional Comments: eyes closed throughout session   Perception Perception Spatial deficits: Pt with eyes closed, unaware of body in space   Praxis      Cognition Arousal/Alertness: Lethargic Behavior During Therapy: Flat affect Overall Cognitive Status: No family/caregiver present to determine baseline cognitive functioning                                 General Comments: Pt following simple 1 step commands and opening eyes every so often, but not consistently, espeically on L side. Pt agitated with RN in room prior to arrival, but PT/OT introducing selves and pt wanting to get OOB to recliner and pt relaxed. Pt was not comabtive, but did appear to have L facial droop and slurred speech with cognitive deficits noted.        Exercises     Shoulder Instructions       General Comments +3 assist (just incase) and security on standby incase pt was to act out. Pt desats 87% on RA with exertion >90% on2-3L O2.    Pertinent Vitals/ Pain       Pain Assessment: Faces Pain Score: 0-No pain Faces Pain Scale: No hurt Pain Location: generalized discomfort, grimacing with suctioning Pain Descriptors / Indicators: Discomfort Pain Intervention(s): Monitored during session  Home Living Family/patient expects to be discharged to:: Private residence Living Arrangements: Alone Available Help at Discharge: Other (Comment) Type of Home: Apartment Home Access: Level entry     Home Layout: One level      Bathroom Shower/Tub: Teacher, early years/pre: Standard Bathroom Accessibility: Yes   Home Equipment: None   Additional Comments: Info taken from PT eval  Lives With: Alone    Prior Functioning/Environment Level of Independence: Independent            Frequency  Min 2X/week        Progress Toward Goals  OT Goals(current goals can now be found in the care plan section)  Progress towards OT goals: Progressing toward goals  Acute Rehab OT Goals Patient Stated Goal: none stated OT Goal Formulation: Patient unable to participate in goal setting Time For Goal Achievement: 03/02/20 Potential to Achieve Goals: Good ADL Goals Pt Will Perform Grooming: with min assist;sitting Pt Will Perform Upper Body Dressing: with min assist;sitting Pt Will Perform Lower Body Dressing: with mod assist;sitting/lateral leans;sit to/from stand Pt/caregiver will Perform Home Exercise Program: Increased strength;Both right and left upper extremity;With Supervision;With written HEP provided;With theraband Additional ADL Goal #1: Pt will demonstrate selective attention during x5 min ADL task with minimal cues in a non-distracting environment. Additional ADL Goal #2: Pt will practice and be able to perform compensatory visual strategies to maximize vision with ADL and mobility with minimal cues.  Plan Discharge plan remains appropriate    Co-evaluation    PT/OT/SLP Co-Evaluation/Treatment: Yes Reason for Co-Treatment: Complexity of the patient's impairments (multi-system involvement);Necessary to address cognition/behavior during functional activity;To address functional/ADL transfers   OT goals addressed during session: ADL's and self-care;Strengthening/ROM  AM-PAC OT "6 Clicks" Daily Activity     Outcome Measure   Help from another person eating meals?: Total Help from another person taking care of personal grooming?: A Lot Help from another person toileting, which includes  using toliet, bedpan, or urinal?: Total Help from another person bathing (including washing, rinsing, drying)?: A Lot Help from another person to put on and taking off regular upper body clothing?: A Lot Help from another person to put on and taking off regular lower body clothing?: Total 6 Click Score: 9    End of Session Equipment Utilized During Treatment: Gait belt;Oxygen  OT Visit Diagnosis: Unsteadiness on feet (R26.81);Muscle weakness (generalized) (M62.81);Other symptoms and signs involving cognitive function;Hemiplegia and hemiparesis   Activity Tolerance Patient tolerated treatment well   Patient Left with call bell/phone within reach;in bed;with bed alarm set;with nursing/sitter in room   Nurse Communication Mobility status        Time: 6195-0932 OT Time Calculation (min): 25 min  Charges: OT General Charges $OT Visit: 1 Visit OT Evaluation $OT Eval Moderate Complexity: 1 Mod OT Treatments $Therapeutic Activity: 8-22 mins  Jefferey Pica, OTR/L Acute Rehabilitation Services Pager: [phone] Office: [phone]    Alleya Demeter C 02/17/2020, 1:58 PM

## 2020-02-17 NOTE — Evaluation (Signed)
Occupational Therapy Evaluation Patient Details Name: David Hendricks MRN: [id number] DOB: 06-08-1950 Today's Date: 02/17/2020    History of Present Illness Pt is a 70 y/o male admitted secondary to slurred speech and L sided weakness. 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 PMH includes tobacco use, CAD, COPD, anxiety, and depression.   Clinical Impression   Pt PTA: Pt living alone and independent. Pt currently, pt limited by decreased strength, decreased ability to care for self and decreased activity tolerance. Pt following 1 step commands and decreased use of LUE, but able to grip the stedy. Pt with L lateral lean and L visual deficits noted to L side. Pt not able to keep eyes open long enough to perform comprehensive visual scanning test. Pt maxA +2 for transfers and sit to stands; pt minA +2 for bed mobility. Pt minA to [place] for ADL. Pt would greatly benefit from continued OT skilled services for ADL, mobility and safety in CIR setting. OT following acutely.     Follow Up Recommendations  CIR    Equipment Recommendations  3 in 1 bedside commode;Wheelchair (measurements OT);Wheelchair cushion (measurements OT)    Recommendations for Other Services Rehab consult     Precautions / Restrictions Precautions Precautions: Fall;Other (comment) Precaution Comments: can be agitated and combative- getting OOB and up to nurse station in recliner seems to help (claustrophobic), L weakness and lean Restrictions Weight Bearing Restrictions: No      Mobility Bed Mobility Overal bed mobility: Needs Assistance Bed Mobility: Sit to Supine     Supine to sit: Min assist;+2 for physical assistance Sit to supine: Max assist;+2 for physical assistance   General bed mobility comments: maxA+2 for trunk elevation and BLE movement    Transfers Overall transfer level: Needs assistance Equipment used: Ambulation equipment used Transfers: Sit to/from Stand Sit to Stand: +2 physical  assistance;Max assist         General transfer comment: MaxA+2 to power up to standing in the steady, heavy lean to with poor awareness of midline once up; EOB  to recliner with stedy +2 assist    Balance Overall balance assessment: Needs assistance Sitting-balance support: Bilateral upper extremity supported;Feet supported Sitting balance-Leahy Scale: Poor Sitting balance - Comments: posterior and left lean Postural control: Posterior lean;Left lateral lean Standing balance support: Bilateral upper extremity supported;During functional activity Standing balance-Leahy Scale: Poor Standing balance comment: heavy left lean with limited ability to correct                           ADL either performed or assessed with clinical judgement   ADL Overall ADL's : Needs assistance/impaired Eating/Feeding: NPO   Grooming: Maximal assistance;Wash/dry face;Sitting Grooming Details (indicate cue type and reason): washing face Upper Body Bathing: Maximal assistance;Sitting   Lower Body Bathing: Total assistance;Cueing for safety;Sitting/lateral leans;Sit to/from stand   Upper Body Dressing : Moderate assistance;Sitting Upper Body Dressing Details (indicate cue type and reason): donning gown Lower Body Dressing: Total assistance;Sitting/lateral leans;Sit to/from stand;Cueing for safety   Toilet Transfer: Maximal assistance;+2 for physical assistance;+2 for safety/equipment;Cueing for safety;Cueing for sequencing Toilet Transfer Details (indicate cue type and reason): with larger stedy Toileting- Clothing Manipulation and Hygiene: Total assistance;Sitting/lateral lean;Sit to/from stand;Cueing for safety Toileting - Clothing Manipulation Details (indicate cue type and reason): condom catheter on     Functional mobility during ADLs: Maximal assistance;+2 for physical assistance;+2 for safety/equipment;Cueing for safety;Cueing for sequencing General ADL Comments: Pt limited by  decreased strength,  decreased ability to care for self and decreased activity tolerance. Pt following 1 step commands and decreased use of LUE, but able to grip the stedy. Pt with L lateral lean and L visual deficits noted to L side. Pt not able to keep eyes open long enough to perform comprehensive visual scanning test.     Vision Baseline Vision/History: Wears glasses Patient Visual Report: Other (comment) (appears to have visual deficit in L lateral visual field and possible R medial visual view.) Vision Assessment?: Vision impaired- to be further tested in functional context;Yes Eye Alignment: Within Functional Limits Ocular Range of Motion: Restricted on the left;Impaired-to be further tested in functional context Alignment/Gaze Preference: Gaze right     Perception Perception Spatial deficits: Pt with eyes closed, unaware of body in space   Praxis      Pertinent Vitals/Pain Pain Assessment: Faces Faces Pain Scale: No hurt Pain Location: generalized discomfort, grimacing with suctioning Pain Descriptors / Indicators: Discomfort Pain Intervention(s): Limited activity within patient's tolerance;Monitored during session     Hand Dominance     Extremity/Trunk Assessment Upper Extremity Assessment Upper Extremity Assessment: Generalized weakness;LUE deficits/detail;RUE deficits/detail RUE Deficits / Details: AROM, WFLs; MMT 4/5 MM grade, good grip RUE Sensation: WNL RUE Coordination: WNL LUE Deficits / Details: LUE AAROM intact; fair grip strength; pt requiring assist for arm movement to hold onto stedy, but able to grip to stand LUE Sensation: decreased light touch LUE Coordination: decreased fine motor;decreased gross motor   Lower Extremity Assessment Lower Extremity Assessment: Generalized weakness;Defer to PT evaluation;LLE deficits/detail LLE Deficits / Details: L sided weakness LLE Sensation: decreased light touch;WNL   Cervical / Trunk Assessment Cervical / Trunk  Assessment: Kyphotic   Communication Communication Communication: Expressive difficulties   Cognition Arousal/Alertness: Lethargic Behavior During Therapy: Flat affect Overall Cognitive Status: No family/caregiver present to determine baseline cognitive functioning                                 General Comments: Pt following simple 1 step commands and opening eyes every so often, but not consistently, espeically on L side. Pt agitated with RN in room prior to arrival, but PT/OT introducing selves and pt wanting to get OOB to recliner and pt relaxed. Pt was not comabtive, but did appear to have L facial droop and slurred speech with cognitive deficits noted.   General Comments  +3 assist (just incase) and security on standby incase pt was to act out. Pt desats 87% on RA with exertion >90% on2-3L O2.    Exercises     Shoulder Instructions      Home Living Family/patient expects to be discharged to:: Private residence Living Arrangements: Alone Available Help at Discharge: Other (Comment) Type of Home: Apartment Home Access: Level entry     Home Layout: One level     Bathroom Shower/Tub: Teacher, early years/pre: Standard Bathroom Accessibility: Yes   Home Equipment: None   Additional Comments: Info taken from PT eval  Lives With: Alone    Prior Functioning/Environment Level of Independence: Independent                 OT Problem List: Decreased strength;Decreased activity tolerance;Impaired balance (sitting and/or standing);Impaired vision/perception;Decreased coordination;Decreased cognition;Decreased safety awareness;Decreased knowledge of use of DME or AE;Decreased knowledge of precautions;Cardiopulmonary status limiting activity;Impaired UE functional use;Pain;Impaired tone      OT Treatment/Interventions: Self-care/ADL training;Therapeutic exercise;Neuromuscular education;Energy conservation;DME and/or AE instruction;Therapeutic  activities;Cognitive remediation/compensation;Visual/perceptual remediation/compensation;Patient/family education;Balance training    OT Goals(Current goals can be found in the care plan section) Acute Rehab OT Goals Patient Stated Goal: none stated OT Goal Formulation: Patient unable to participate in goal setting Time For Goal Achievement: 03/02/20 Potential to Achieve Goals: Good ADL Goals Pt Will Perform Grooming: with min assist;sitting Pt Will Perform Upper Body Dressing: with min assist;sitting Pt Will Perform Lower Body Dressing: with mod assist;sitting/lateral leans;sit to/from stand Pt/caregiver will Perform Home Exercise Program: Increased strength;Both right and left upper extremity;With Supervision;With written HEP provided;With theraband Additional ADL Goal #1: Pt will demonstrate selective attention during x5 min ADL task with minimal cues in a non-distracting environment. Additional ADL Goal #2: Pt will practice and be able to perform compensatory visual strategies to maximize vision with ADL and mobility with minimal cues.  OT Frequency: Min 2X/week   Barriers to D/C:            Co-evaluation PT/OT/SLP Co-Evaluation/Treatment: Yes Reason for Co-Treatment: Complexity of the patient's impairments (multi-system involvement);To address functional/ADL transfers;Necessary to address cognition/behavior during functional activity;For patient/therapist safety   OT goals addressed during session: ADL's and self-care      AM-PAC OT "6 Clicks" Daily Activity     Outcome Measure Help from another person eating meals?: Total Help from another person taking care of personal grooming?: A Lot Help from another person toileting, which includes using toliet, bedpan, or urinal?: Total Help from another person bathing (including washing, rinsing, drying)?: A Lot Help from another person to put on and taking off regular upper body clothing?: A Lot Help from another person to put on and  taking off regular lower body clothing?: Total 6 Click Score: 9   End of Session Equipment Utilized During Treatment: Gait belt;Oxygen Nurse Communication: Mobility status  Activity Tolerance: Patient tolerated treatment well Patient left: in chair;with call bell/phone within reach;with chair alarm set;Other (comment) (pt left at nurse's station)  OT Visit Diagnosis: Unsteadiness on feet (R26.81);Muscle weakness (generalized) (M62.81);Other symptoms and signs involving cognitive function;Hemiplegia and hemiparesis                Time: 1000-1035 OT Time Calculation (min): 35 min Charges:  OT General Charges $OT Visit: 1 Visit OT Evaluation $OT Eval Moderate Complexity: 1 Mod  Jefferey Pica, OTR/L Acute Rehabilitation Services Pager: [phone] Office: [phone]   Alexee Delsanto C 02/17/2020, 1:26 PM

## 2020-02-18 ENCOUNTER — Inpatient Hospital Stay (HOSPITAL_COMMUNITY): Payer: Medicare Other

## 2020-02-18 DIAGNOSIS — I639 Cerebral infarction, unspecified: Secondary | ICD-10-CM | POA: Diagnosis not present

## 2020-02-18 DIAGNOSIS — I69391 Dysphagia following cerebral infarction: Secondary | ICD-10-CM | POA: Diagnosis not present

## 2020-02-18 DIAGNOSIS — I63411 Cerebral infarction due to embolism of right middle cerebral artery: Secondary | ICD-10-CM | POA: Diagnosis not present

## 2020-02-18 DIAGNOSIS — I69322 Dysarthria following cerebral infarction: Secondary | ICD-10-CM | POA: Diagnosis not present

## 2020-02-18 DIAGNOSIS — F17210 Nicotine dependence, cigarettes, uncomplicated: Secondary | ICD-10-CM | POA: Diagnosis not present

## 2020-02-18 MED ORDER — ONDANSETRON HCL 4 MG/2ML IJ SOLN
4.0000 mg | Freq: Four times a day (QID) | INTRAMUSCULAR | Status: DC | PRN
  Administered 2020-02-18: 4 mg via INTRAVENOUS
  Filled 2020-02-18: qty 2

## 2020-02-18 MED ORDER — WHITE PETROLATUM EX OINT
TOPICAL_OINTMENT | CUTANEOUS | Status: AC
  Filled 2020-02-18: qty 28.35

## 2020-02-18 MED ORDER — ASPIRIN EC 81 MG PO TBEC
81.0000 mg | DELAYED_RELEASE_TABLET | Freq: Every day | ORAL | Status: DC
  Administered 2020-02-18 – 2020-02-24 (×7): 81 mg via ORAL
  Filled 2020-02-18 (×7): qty 1

## 2020-02-18 MED ORDER — PANTOPRAZOLE SODIUM 40 MG IV SOLR
40.0000 mg | INTRAVENOUS | Status: DC
  Administered 2020-02-18: 40 mg via INTRAVENOUS
  Filled 2020-02-18: qty 40

## 2020-02-18 MED ORDER — ASPIRIN 300 MG RE SUPP: 300.0000 mg | Freq: Every day | RECTAL | Status: DC

## 2020-02-18 MED ORDER — RESOURCE THICKENUP CLEAR PO POWD
ORAL | Status: DC | PRN
  Filled 2020-02-18: qty 125

## 2020-02-18 NOTE — Progress Notes (Signed)
Pt hollered out and stated that he has a burning sensation in his upper stomach and his chest MD notified. Orders placed

## 2020-02-18 NOTE — Progress Notes (Addendum)
Progress Note    David Hendricks  JOA:[id number] DOB: December 08, 1950  DOA: 02/15/2020 PCP: Horald Pollen, MD    Brief Narrative:     Medical records reviewed and are as summarized below:  David Hendricks is an 70 y.o. male with a PMH oftobacco abuse 2 pack a day, COPD, CAD, HLD, hyperthyroidism, thyroid nodule, BPH, anxiety and depression, who presented to the ED via EMS with slurred speech and left-sided weakness. CODE STROKE activated. In the ED, evaluated by neurologist who noted dysarthria and a left facial droop. 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  Assessment/Plan:   Principal Problem:   CVA (cerebral vascular accident) ([place]) Active Problems:   Tobacco abuse   History of non-ST elevation myocardial infarction (NSTEMI)   Hyperlipidemia   Old MI (myocardial infarction)   CAD (coronary artery disease)   Cigarette nicotine dependence without complication   BPH (benign prostatic hyperplasia)   Multinodular goiter   Subclinical hyperthyroidism   Pulmonary emphysema (HCC)   Aortic atherosclerosis (HCC)   Thyroid mass   Anxiety and depression   Obesity   Hyperbilirubinemia   Dysphagia following cerebrovascular accident   Dysarthria due to recent cerebral infarction    Acute CVA (cerebrovascular accident) with moderate dysarthria, left facial weakness and dysphagia POA -telemetry.  -tPA not given because patient outside of normal window. -CT head showed no evidence of acute large vascular territory infarct. 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 CTA of head/neck showed nondominant right  vertebral artery is diminutive throughout its course and likely occluded intradurally.  aspirin 81 mg daily and clopidogrel 75 mg daily. Continue DAPT for 3 weeks and then plavix alone. (ASA 300 mg suppository while he is NPO) -FLP showed a cholesterol of 168, LDL 111. Continue statin.   -HgbA1c 5.2%.  -2 D Echocardiogram showed EF 60-10%, grade I diastolic dysfunction and mild LVH.  -PT recommending SNF.  Mental status seems improved today (yesterday: realistically unable to care for himself, lived alone prior to admission, unable to appreciate risks of leaving AMA) -SLP following-- had MBS already-- discussed feeding tube and cortrack with patient -- he is not interested in either -palliative consult placed    Agitation -appears resolved -calm and cooperative thus far today    Hyperbilirubinemia Mild, unclear etiology. Outpatient work up recommended.    Tobacco abuse, POA RN to provide tobacco cessation counseling. Continue nicoderm patch 21 mg (smokes 2 PPD).    Hyperlipidemia, POA FLP showed a cholesterol of 168, LDL 111. Continue statin.    CAD (coronary artery disease) with h/o MI and aortic atherosclerosis, POA Continue ASA/statin.    Subclinical hyperthyroidism/thyroid mass/multinodular goiter, POA Continue methimazole.    Pulmonary emphysema (HCC) Continue PRN albuterol. Not taking symbicort anymore.    Anxiety and depression Continue Desyrel QHS.    BPH, POA Continue Flomax.   Progression: Sister tells me he has no close family that lives near-by and she lives in [place]. He has no children. Palliative care consulted to assist with goals of care and disposition. Sister confirms he will likely refuse SNF   Family Communication/Anticipated D/C date and plan/Code Status   DVT prophylaxis: Lovenox ordered. Code Status: Full  Code.  Disposition Plan: Status is: Inpatient  Remains inpatient appropriate because:Inpatient level of care appropriate due to severity  of illness   Dispo: The patient is from: Home              Anticipated d/c is to: tbd              Anticipated d/c date is: 2 days              Patient currently is not medically stable to d/c.   Difficult to place patient No         Medical Consultants:    Neurology  Palliative care  Subjective:   hungry  Objective:    Vitals:   02/17/20 1247 02/17/20 1726 02/18/20 0153 02/18/20 0509  BP: 128/65 126/63 129/60 115/80  Pulse: 80 77 88 88  Resp: 19 18 18 17   Temp: 98.5 F (36.9 C) 98 F (36.7 C) 98.7 F (37.1 C) 98.3 F (36.8 C)  TempSrc: Oral Oral Oral Oral  SpO2: 97% 93% 96% 93%  Weight:      Height:        Intake/Output Summary (Last 24 hours) at 02/18/2020 1137 Last data filed at 02/18/2020 0700 Gross per 24 hour  Intake 1588.45 ml  Output 1600 ml  Net -11.55 ml   Filed Weights   02/15/20 1355  Weight: 99.8 kg    Exam:  General: Appearance:     Overweight male in no acute distress     Lungs:     respirations unlabored  Heart:    Normal heart rate. Normal rhythm. No murmurs, rubs, or gallops.   MS:   All extremities are intact.   Neurologic:   Awake, alert, speech slurred, cooperative    Data Reviewed:   I have personally reviewed following labs and imaging studies:  Labs: Labs show the following:   Basic Metabolic Panel: Recent Labs  Lab 02/15/20 1325 02/15/20 1333 02/16/20 0500  NA 139 139 137  K 4.4 3.9 3.7  CL 102 102 101  CO2 25  --  24  GLUCOSE 104* 104* 92  BUN 9 10 10   CREATININE 1.00 0.90 0.98  CALCIUM 9.6  --  8.8*   GFR Estimated Creatinine Clearance: 87.2 mL/min (by C-G formula based on SCr of 0.98 mg/dL). Liver Function Tests: Recent Labs  Lab 02/15/20 1325 02/16/20 0500  AST 33 24  ALT 18 17  ALKPHOS 106 95  BILITOT 1.5* 1.5*  PROT 6.7 6.3*  ALBUMIN 3.6 3.5   No results for input(s): LIPASE, AMYLASE in the last 168 hours. No results for input(s): AMMONIA in the last 168 hours. Coagulation  profile Recent Labs  Lab 02/15/20 1325  INR 0.9    CBC: Recent Labs  Lab 02/15/20 1325 02/15/20 1333 02/16/20 0500  WBC 9.7  --  13.9*  NEUTROABS 6.3  --   --   HGB 18.3* 18.4* 16.9  HCT 55.2* 54.0* 48.6  MCV 86.4  --  85.7  PLT 192  --  170   Cardiac Enzymes: Recent Labs  Lab 02/15/20 1815  CKTOTAL 62   BNP (last 3 results) No results for input(s): PROBNP in the last 8760 hours. CBG: Recent Labs  Lab 02/15/20 1408  GLUCAP 96   D-Dimer: No results for input(s): DDIMER in the last 72 hours. Hgb A1c: Recent Labs    02/16/20 0500  HGBA1C 5.2   Lipid Profile: Recent Labs    02/16/20 0500  CHOL 168  HDL 41  LDLCALC 111*  TRIG 80  CHOLHDL 4.1   Thyroid function studies: Recent Labs    02/15/20 1518  TSH 0.919   Anemia work up: No results for input(s): VITAMINB12, FOLATE, FERRITIN, TIBC, IRON, RETICCTPCT in the last 72 hours. Sepsis Labs: Recent Labs  Lab 02/15/20 1325 02/15/20 1815 02/16/20 0500  WBC 9.7  --  13.9*  LATICACIDVEN  --  1.0  --     Microbiology Recent Results (from the past 240 hour(s))  SARS CORONAVIRUS 2 (TAT 6-24 HRS) Nasopharyngeal Nasopharyngeal Swab     Status: None   Collection Time: 02/15/20  2:26 PM   Specimen: Nasopharyngeal Swab  Result Value Ref Range Status   SARS Coronavirus 2 NEGATIVE NEGATIVE Final    Comment: (NOTE) SARS-CoV-2 target nucleic acids are NOT DETECTED.  The SARS-CoV-2 RNA is generally detectable in upper and lower respiratory specimens during the acute phase of infection. Negative results do not preclude SARS-CoV-2 infection, do not rule out co-infections with other pathogens, and should not be used as the sole basis for treatment or other patient management decisions. Negative results must be combined with clinical observations, patient history, and epidemiological information. The expected result is Negative.  Fact Sheet for Patients: SugarRoll.be  Fact  Sheet for Healthcare Providers: https://www.woods-mathews.com/  This test is not yet approved or cleared by the [place] FDA and  has been authorized for detection and/or diagnosis of SARS-CoV-2 by FDA under an Emergency Use Authorization (EUA). This EUA will remain  in effect (meaning this test can be used) for the duration of the COVID-19 declaration under Se ction 564(b)(1) of the Act, 21 U.S.C. section 360bbb-3(b)(1), unless the authorization is terminated or revoked sooner.  Performed at [facility], [place] [address], [place], [place] [address]     Procedures and diagnostic studies:  DG Swallowing Func-Speech Pathology  Result Date: 02/17/2020 Objective Swallowing Evaluation: Type of Study: MBS-Modified Barium Swallow Study  Patient Details Name: Milad Bublitz MRN: [id number] Date of Birth: 1950/12/02 Today's Date: 02/17/2020 Time: SLP Start Time (ACUTE ONLY): 1335 -SLP Stop Time (ACUTE ONLY): 1353 SLP Time Calculation (min) (ACUTE ONLY): 18 min Past Medical History: Past Medical History: Diagnosis Date . Allergy  . Anxiety  . Aortic atherosclerosis ([place])  . Arthritis  . Benign prostatic hyperplasia (BPH) with urinary urgency  . Chronic kidney disease   KIDNEY STONES . COPD (chronic obstructive pulmonary disease) ([place])   possible  . Coronary artery disease   10/18 PCI/DES to pLAD, mild nonobstructive disease in the Lcx/RCA. Normal EF.  Marland Kitchen Depression  . Dyslipidemia  . Dysrhythmia 2016  irregular heartbeat . Gallstones  . GERD (gastroesophageal reflux disease)  . History of kidney stones  . Hyperlipidemia  . Hyperthyroidism  . Leg cramps  . Multiple pulmonary nodules  . Myocardial infarction ([place])   2018 . Pneumonia  . Seasonal allergies  . Thyroid nodule  . Vertigo  . Vitamin B 12 deficiency  Past Surgical History: Past Surgical History: Procedure Laterality Date . COLONOSCOPY   . CORONARY ANGIOPLASTY  10/09/2016 . CORONARY STENT INTERVENTION N/A 10/09/2016  Procedure: CORONARY  STENT INTERVENTION;  Surgeon: Nelva Bush, MD;  Location: [facility];  Service: Cardiovascular;  Laterality: N/A; . CYSTOSCOPY WITH RETROGRADE PYELOGRAM, URETEROSCOPY AND STENT PLACEMENT Left 06/17/2019  Procedure: CYSTOSCOPY WITH RETROGRADE PYELOGRAM, URETEROSCOPY AND STENT PLACEMENT;  Surgeon: Robley Fries, MD;  Location: [facility];  Service: Urology;  Laterality: Left;  1 HR . HOLMIUM LASER APPLICATION  Left 06/17/2019  Procedure: HOLMIUM LASER APPLICATION;  Surgeon: Robley Fries, MD;  Location: [facility];  Service: Urology;  Laterality: Left; . INTRAVASCULAR ULTRASOUND/IVUS N/A 10/09/2016  Procedure: Intravascular Ultrasound/IVUS;  Surgeon: Nelva Bush, MD;  Location: [facility];  Service: Cardiovascular;  Laterality: N/A; . IR CHOLANGIOGRAM EXISTING TUBE  11/18/2018 . IR PERC CHOLECYSTOSTOMY  11/16/2018 . LEFT HEART CATH AND CORONARY ANGIOGRAPHY N/A 10/09/2016  Procedure: LEFT HEART CATH AND CORONARY ANGIOGRAPHY;  Surgeon: Nelva Bush, MD;  Location: [facility];  Service: Cardiovascular;  Laterality: N/A; HPI: Pt is a 70 y/o male admitted secondary to slurred speech and L sided weakness. 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 PMH includes tobacco use, CAD, COPD, anxiety, and depression.  Subjective: pt lethargic but following commands Assessment / Plan / Recommendation CHL IP CLINICAL IMPRESSIONS 02/17/2020 Clinical Impression Pt presents with a significant oropharyngeal dysphagia that is also exacerbated by current level of alertness. He is quite lethargic throughout testing, although alert enough to follow commands and accept boluses consistently. Limited trials were given in light of severity of dysphagia and mentation. He has limited lingual control of boluses, allowing a spoonful of nectar thick liquids to spill immediately back into his laryngeal vestibule. Once it is there, he tries to achieve some laryngeal closure, which is successful at expelling some of the  bolus back out of his airway, but he does aspirate some of it. Pt also has very limited base of tongue retraction, hyolaryngeal movement, and pharyngeal squeeze, so that almost an entire bolus of puree remains in his valleculae post-swallow. He cannot clear this on his own despite cues, with SLP also providing suction via yankauer to clear as much as possible. As this pureed residue seems to thin out by mixing with secretions, he begins to aspirate this as well. Recommend that pt remain NPO for now with consideration of temporary, alternative means of nutrition with plans to retest swallowing function when more alert. MD in agreement with keeping pt NPO and also plans to involve palliative care.  SLP Visit Diagnosis Dysphagia, oropharyngeal phase (R13.12) Attention and concentration deficit following -- Frontal lobe and executive function deficit following -- Impact on safety and function Severe aspiration risk   CHL IP TREATMENT RECOMMENDATION 02/17/2020 Treatment Recommendations Therapy as outlined in treatment plan below   Prognosis 02/17/2020 Prognosis for Safe Diet Advancement Fair Barriers to Reach Goals Cognitive deficits;Severity of deficits Barriers/Prognosis Comment -- CHL IP DIET RECOMMENDATION 02/17/2020 SLP Diet Recommendations NPO;Alternative means - temporary Liquid Administration via -- Medication Administration Via alternative means Compensations -- Postural Changes --   CHL IP OTHER RECOMMENDATIONS 02/17/2020 Recommended Consults -- Oral Care Recommendations Oral care QID Other Recommendations Have oral suction available   CHL IP FOLLOW UP RECOMMENDATIONS 02/17/2020 Follow up Recommendations Inpatient Rehab   CHL IP FREQUENCY AND DURATION 02/17/2020 Speech Therapy Frequency (ACUTE ONLY) min 2x/week Treatment Duration 2 weeks      CHL IP ORAL PHASE 02/17/2020 Oral Phase Impaired Oral - Pudding Teaspoon -- Oral - Pudding Cup -- Oral - Honey Teaspoon -- Oral - Honey Cup -- Oral - Nectar Teaspoon Decreased bolus  cohesion;Weak lingual manipulation Oral - Nectar Cup -- Oral - Nectar Straw -- Oral - Thin Teaspoon -- Oral - Thin Cup -- Oral - Thin Straw -- Oral - Puree Lingual/palatal residue;Weak lingual manipulation Oral - Mech Soft -- Oral - Regular -- Oral - Multi-Consistency -- Oral - Pill -- Oral Phase - Comment --  CHL IP  PHARYNGEAL PHASE 02/17/2020 Pharyngeal Phase Impaired Pharyngeal- Pudding Teaspoon -- Pharyngeal -- Pharyngeal- Pudding Cup -- Pharyngeal -- Pharyngeal- Honey Teaspoon -- Pharyngeal -- Pharyngeal- Honey Cup -- Pharyngeal -- Pharyngeal- Nectar Teaspoon Reduced pharyngeal peristalsis;Reduced epiglottic inversion;Reduced anterior laryngeal mobility;Reduced laryngeal elevation;Reduced tongue base retraction;Pharyngeal residue - valleculae;Penetration/Aspiration before swallow Pharyngeal Material enters airway, passes BELOW cords without attempt by patient to eject out (silent aspiration) Pharyngeal- Nectar Cup -- Pharyngeal -- Pharyngeal- Nectar Straw -- Pharyngeal -- Pharyngeal- Thin Teaspoon -- Pharyngeal -- Pharyngeal- Thin Cup -- Pharyngeal -- Pharyngeal- Thin Straw -- Pharyngeal -- Pharyngeal- Puree Reduced pharyngeal peristalsis;Reduced epiglottic inversion;Reduced anterior laryngeal mobility;Reduced laryngeal elevation;Reduced tongue base retraction;Pharyngeal residue - valleculae;Penetration/Apiration after swallow Pharyngeal Material enters airway, passes BELOW cords without attempt by patient to eject out (silent aspiration) Pharyngeal- Mechanical Soft -- Pharyngeal -- Pharyngeal- Regular -- Pharyngeal -- Pharyngeal- Multi-consistency -- Pharyngeal -- Pharyngeal- Pill -- Pharyngeal -- Pharyngeal Comment --  CHL IP CERVICAL ESOPHAGEAL PHASE 02/17/2020 Cervical Esophageal Phase (No Data) Pudding Teaspoon -- Pudding Cup -- Honey Teaspoon -- Honey Cup -- Nectar Teaspoon -- Nectar Cup -- Nectar Straw -- Thin Teaspoon -- Thin Cup -- Thin Straw -- Puree -- Mechanical Soft -- Regular -- Multi-consistency --  Pill -- Cervical Esophageal Comment -- Osie Bond., M.A. [place] Pager [phone] Office [phone] 02/17/2020, 3:25 PM              ECHOCARDIOGRAM COMPLETE  Result Date: 02/16/2020    ECHOCARDIOGRAM REPORT   Patient Name:   HARJAS BIGGINS Date of Exam: 02/16/2020 Medical Rec #:  [id number]  Height:       73.0 in Accession #:    [id number] Weight:       220.0 lb Date of Birth:  10-Aug-1950  BSA:          2.241 m Patient Age:    10 years   BP:           116/57 mmHg Patient Gender: M          HR:           60 bpm. Exam Location:  Inpatient Procedure: 2D Echo, Cardiac Doppler and Color Doppler Indications:    Stroke  History:        Patient has prior history of Echocardiogram examinations, most                 recent 01/24/2017. Previous Myocardial Infarction and CAD,                 Stroke, Signs/Symptoms:Chest Pain; Risk Factors:Current Smoker.  Sonographer:    Roseanna Rainbow RDCS Referring Phys: [phone] NA LI  Sonographer Comments: Technically difficult study due to poor echo windows. IMPRESSIONS  1. Left ventricular ejection fraction, by estimation, is 60 to 65%. The left ventricle has normal function. The left ventricle has no regional wall motion abnormalities. There is mild left ventricular hypertrophy. Left ventricular diastolic parameters are consistent with Grade I diastolic dysfunction (impaired relaxation).  2. Right ventricular systolic function is mildly reduced. The right ventricular size is mildly enlarged. Tricuspid regurgitation signal is inadequate for assessing PA pressure.  3. The mitral valve is normal in structure. Trivial mitral valve regurgitation. No evidence of mitral stenosis.  4. The aortic valve is tricuspid. Aortic valve regurgitation is not visualized. No aortic stenosis is present.  5. Aortic dilatation noted. There is mild dilatation of the ascending aorta, measuring 40 mm.  6. The inferior vena cava is normal in size with greater than  50% respiratory variability,  suggesting right atrial pressure of 3 mmHg. FINDINGS  Left Ventricle: Left ventricular ejection fraction, by estimation, is 60 to 65%. The left ventricle has normal function. The left ventricle has no regional wall motion abnormalities. The left ventricular internal cavity size was normal in size. There is  mild left ventricular hypertrophy. Left ventricular diastolic parameters are consistent with Grade I diastolic dysfunction (impaired relaxation). Right Ventricle: The right ventricular size is mildly enlarged. No increase in right ventricular wall thickness. Right ventricular systolic function is mildly reduced. Tricuspid regurgitation signal is inadequate for assessing PA pressure. Left Atrium: Left atrial size was normal in size. Right Atrium: Right atrial size was normal in size. Pericardium: Trivial pericardial effusion is present. Mitral Valve: The mitral valve is normal in structure. Trivial mitral valve regurgitation. No evidence of mitral valve stenosis. Tricuspid Valve: The tricuspid valve is normal in structure. Tricuspid valve regurgitation is not demonstrated. Aortic Valve: The aortic valve is tricuspid. Aortic valve regurgitation is not visualized. No aortic stenosis is present. Pulmonic Valve: The pulmonic valve was normal in structure. Pulmonic valve regurgitation is not visualized. Aorta: Aortic dilatation noted. There is mild dilatation of the ascending aorta, measuring 40 mm. Venous: The inferior vena cava is normal in size with greater than 50% respiratory variability, suggesting right atrial pressure of 3 mmHg. IAS/Shunts: No atrial level shunt detected by color flow Doppler.  LEFT VENTRICLE PLAX 2D LVIDd:         4.40 cm      Diastology LVIDs:         2.80 cm      LV e' medial:    7.29 cm/s LV PW:         1.30 cm      LV E/e' medial:  11.1 LV IVS:        1.50 cm      LV e' lateral:   12.40 cm/s LVOT diam:     1.80 cm      LV E/e' lateral: 6.5 LV SV:         46 LV SV Index:   20 LVOT Area:      2.54 cm  LV Volumes (MOD) LV vol d, MOD A2C: 36.9 ml LV vol d, MOD A4C: 120.0 ml LV vol s, MOD A2C: 11.2 ml LV vol s, MOD A4C: 36.3 ml LV SV MOD A2C:     25.7 ml LV SV MOD A4C:     120.0 ml LV SV MOD BP:      46.2 ml RIGHT VENTRICLE RV S prime:     9.03 cm/s TAPSE (M-mode): 1.4 cm LEFT ATRIUM             Index       RIGHT ATRIUM          Index LA diam:        3.75 cm 1.67 cm/m  RA Area:     8.46 cm LA Vol (A2C):   13.5 ml 6.03 ml/m  RA Volume:   19.90 ml 8.88 ml/m LA Vol (A4C):   23.9 ml 10.67 ml/m LA Biplane Vol: 19.8 ml 8.84 ml/m  AORTIC VALVE LVOT Vmax:   75.10 cm/s LVOT Vmean:  49.700 cm/s LVOT VTI:    0.180 m  AORTA Ao Root diam: 3.60 cm Ao Asc diam:  4.00 cm MITRAL VALVE MV Area (PHT): 3.27 cm    SHUNTS MV Decel Time: 232 msec    Systemic VTI:  0.18 m MV  E velocity: 80.60 cm/s  Systemic Diam: 1.80 cm MV A velocity: 82.70 cm/s MV E/A ratio:  0.97 Loralie Champagne MD Electronically signed by Loralie Champagne MD Signature Date/Time: 02/16/2020/3:57:28 PM    Final    VAS [place] LOWER EXTREMITY VENOUS (DVT)  Result Date: 02/16/2020  Lower Venous DVT Study Indications: Stroke.  Risk Factors: None identified. Comparison Study: No prior studies. Performing Technologist: Oliver Hum RVT  Examination Guidelines: A complete evaluation includes B-mode imaging, spectral Doppler, color Doppler, and power Doppler as needed of all accessible portions of each vessel. Bilateral testing is considered an integral part of a complete examination. Limited examinations for reoccurring indications may be performed as noted. The reflux portion of the exam is performed with the patient in reverse Trendelenburg.  +---------+---------------+---------+-----------+----------+--------------+ RIGHT    CompressibilityPhasicitySpontaneityPropertiesThrombus Aging +---------+---------------+---------+-----------+----------+--------------+ CFV      Full           Yes      Yes                                  +---------+---------------+---------+-----------+----------+--------------+ SFJ      Full                                                        +---------+---------------+---------+-----------+----------+--------------+ FV Prox  Full                                                        +---------+---------------+---------+-----------+----------+--------------+ FV Mid   Full                                                        +---------+---------------+---------+-----------+----------+--------------+ FV DistalFull                                                        +---------+---------------+---------+-----------+----------+--------------+ PFV      Full                                                        +---------+---------------+---------+-----------+----------+--------------+ POP      Full           Yes      Yes                                 +---------+---------------+---------+-----------+----------+--------------+ PTV      Full                                                        +---------+---------------+---------+-----------+----------+--------------+  PERO     Full                                                        +---------+---------------+---------+-----------+----------+--------------+   +---------+---------------+---------+-----------+----------+--------------+ LEFT     CompressibilityPhasicitySpontaneityPropertiesThrombus Aging +---------+---------------+---------+-----------+----------+--------------+ CFV      Full           Yes      Yes                                 +---------+---------------+---------+-----------+----------+--------------+ SFJ      Full                                                        +---------+---------------+---------+-----------+----------+--------------+ FV Prox  Full                                                         +---------+---------------+---------+-----------+----------+--------------+ FV Mid   Full                                                        +---------+---------------+---------+-----------+----------+--------------+ FV DistalFull                                                        +---------+---------------+---------+-----------+----------+--------------+ PFV      Full                                                        +---------+---------------+---------+-----------+----------+--------------+ POP      Full           Yes      Yes                                 +---------+---------------+---------+-----------+----------+--------------+ PTV      Full                                                        +---------+---------------+---------+-----------+----------+--------------+ PERO     Full                                                        +---------+---------------+---------+-----------+----------+--------------+  Summary: RIGHT: - There is no evidence of deep vein thrombosis in the lower extremity.  - No cystic structure found in the popliteal fossa.  LEFT: - There is no evidence of deep vein thrombosis in the lower extremity.  - No cystic structure found in the popliteal fossa.  *See table(s) above for measurements and observations. Electronically signed by Jamelle Haring on 02/16/2020 at 5:03:07 PM.    Final     Medications:   .  stroke: mapping our early stages of recovery book   Does not apply Once  . aspirin EC  81 mg Oral Daily   Or  . aspirin  300 mg Rectal Daily  . atorvastatin  80 mg Oral Daily  . chlorhexidine  15 mL Mouth Rinse BID  . clopidogrel  75 mg Oral Daily  . mouth rinse  15 mL Mouth Rinse q12n4p  . methimazole  5 mg Oral Daily  . nicotine  21 mg Transdermal Daily  . tamsulosin  0.4 mg Oral Daily  . traZODone  50 mg Oral QHS  . white petrolatum       Continuous Infusions: . sodium chloride 100 mL/hr at 02/18/20 0417      LOS: 3 days   Geradine Girt  [facility]   How to contact the [facility] Attending or Consulting provider [place] or covering provider during after hours [place], for this patient?  1. Check the care team in [facility] and look for a) attending/consulting TRH provider listed and b) the [facility] team listed 2. Log into [email or web address] and use 's universal password to access. If you do not have the password, please contact the hospital operator. 3. Locate the [facility] provider you are looking for under [facility] and page to a number that you can be directly reached. 4. If you still have difficulty reaching the provider, please page the [facility] (Director on Call) for the Hospitalists listed on amion for assistance.  02/18/2020, 11:37 AM

## 2020-02-18 NOTE — Progress Notes (Signed)
  Speech Language Pathology Treatment: Dysphagia  Patient Details Name: David Hendricks MRN: [id number] DOB: 08-26-1950 Today's Date: 02/18/2020 Time: 2395-3202 SLP Time Calculation (min) (ACUTE ONLY): 13 min  Assessment / Plan / Recommendation Clinical Impression  Pt is much more alert today, and still able to follow commands consistently. SLP provided trials of purees and nectar thick liquids via spoon with cueing provided to try to maximize safety given severity of dysphagia noted on swallow study yesterday. Strategies included multiple, hard swallows and intermittent coughing with use of yankauer. Pt reflexively coughs more consistently with nectar thick liquids than he does with purees, attributing this to "phlegm." Little to no residue is retrieved when using yankauer today. Although he just had imaging done on previous date, I think repeat testing is indicated to get a better sense of what his oropharyngeal swallowing is like now that he is much more alert. Will keep NPO pending completion this afternoon.    HPI HPI: Pt is a 70 y/o male admitted secondary to slurred speech and L sided weakness. 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 PMH includes tobacco use, CAD, COPD, anxiety, and depression.      SLP Plan  MBS       Recommendations  Diet recommendations: NPO Medication Administration: Via alternative means                Oral Care Recommendations: Oral care QID Follow up Recommendations: Inpatient Rehab SLP Visit Diagnosis: Dysphagia, oropharyngeal phase (R13.12) Plan: MBS       GO                David Hendricks., M.A. [place] [facility] Pager [phone] Office [phone]  02/18/2020, 3:16 PM

## 2020-02-18 NOTE — Plan of Care (Signed)
  Problem: Education: Goal: Knowledge of General Education information will improve Description: Including pain rating scale, medication(s)/side effects and non-pharmacologic comfort measures Outcome: Progressing   Problem: Safety: Goal: Ability to remain free from injury will improve Outcome: Progressing   

## 2020-02-18 NOTE — Progress Notes (Signed)
Modified Barium Swallow Progress Note  Patient Details  Name: David Hendricks MRN: [id number] Date of Birth: 09/06/1950  Today's Date: 02/18/2020  Modified Barium Swallow completed.  Full report located under Chart Review in the Imaging Section.  Brief recommendations include the following:  Clinical Impression  Although pt presents with similar areas of weakness compared to yesterday's MBS, his improved mentation has a positive impact on his performance and ability to incorporate swallowing strategies. Head turns do not have a significant effect, but he has mildly improved vallecular clearance with use of a chin tuck and hard swallows. Residue is still significant, but he demonstrated some awareness when he spontaneously brought most of the residue back into his mouth to swallow it again, clearing more of it on the second try. On subsequent swallows, pt could be cued to do the same. Penetration and trace aspiration occurs with nectar thick liquids that still spill into the airway before he swallows, but no aspiration is observed with honey thick liquids or purees. There is trace amounts of penetration that is not always fully cleared, and when also considering the amount of vallecular residue he has, he would still remain at risk for some aspiration across the duration of a meal. Discussed with pt and MD, who is in agreement with starting modified diet with use of precautions to reduce but not eliminate aspiration risk while palliative care consult is pending. Therefore, will start Dys 1 diet and honey thick liquids with the following  precautions: chin tuck, alternate solids/liquids, hard swallows, cue to bring food back into his mouth and swallow again.   Swallow Evaluation Recommendations       SLP Diet Recommendations: Dysphagia 1 (Puree) solids;Honey thick liquids   Liquid Administration via: Cup   Medication Administration: Crushed with puree   Supervision: Patient able to self feed;Full  supervision/cueing for compensatory strategies   Compensations: Minimize environmental distractions;Slow rate;Follow solids with liquid;Chin tuck;Other (Comment) (cue pt to bring food back into his mouth and reswallow)   Postural Changes: Seated upright at 90 degrees;Remain semi-upright after after feeds/meals (Comment)   Oral Care Recommendations: Oral care QID   Other Recommendations: Have oral suction available;Order thickener from pharmacy;Prohibited food (jello, ice cream, thin soups);Remove water pitcher    Osie Bond., M.A. [place] [facility] Pager [phone] Office [phone]  02/18/2020,4:34 PM

## 2020-02-18 NOTE — Progress Notes (Signed)
PMT consult received and chart reviewed. Discussed with RN in detail. Met with David Hendricks at bedside. He is awake, alert, oriented x4. He just passed MBS evaluation and dysphagia 1 diet has been initiated. He is accepting bites and sips from RN. David Hendricks is eager to return home and tells me he has friends who will help take care of him. Expressed need for aggressive PT/OT prior to return home (and that he has not even gotten out of the bed yet). Expressed high fall risk. David Hendricks does not seem interested in rehab or CIR.   He confirms sister-in-law, David Hendricks is his emergency contact and would wish for her to be documented HCPOA. Call placed to [name] at bedside. No answer. Called [name]'s daughter, David Hendricks who shares that her mother is on a plane to [place]. She will be at [facility] tomorrow 2/10.   Reassured [name] that this NP will f/u in AM when his sister-in-law is present. Plans to further discuss goals of care and advance directives with SIL.  Thank you.   NO CHARGE  Ihor Dow, [place], FNP-C Palliative Medicine Team  Phone: [phone] Fax: [phone]

## 2020-02-18 NOTE — Progress Notes (Addendum)
STROKE TEAM PROGRESS NOTE   INTERVAL HISTORY No acute overnight events. Pt is evaluated at the bedside. No family present at the bedside. Vitals stable. Denies any complaints. Pt states he wants to eat food. On examination today, patient is alert and oriented x4. Pt still has some facial drooping on Lt side and slurred speech. Some weakness observed on rapid finger tapping movements on Lt side. FNT test also slow on Left side.   PT recommends CIR with 24 hr supervision.   Pt didn't pass swallow Eval.  Barium swallow study revealed dysphagia in oropharyngeal phase.  Pt is NPO and on IV fluids. Pt is on Asprin 81 mg and Plavix 75 mg but on hold as he was NPO. We ordered Aspirin suppository until pt is NPO. Wbc's 13.9.  Loop recorder before discharge. No more neurological interventions needed. Neurology will sign off.   Vitals:   02/17/20 1247 02/17/20 1726 02/18/20 0153 02/18/20 0509  BP: 128/65 126/63 129/60 115/80  Pulse: 80 77 88 88  Resp: 19 18 18 17   Temp: 98.5 F (36.9 C) 98 F (36.7 C) 98.7 F (37.1 C) 98.3 F (36.8 C)  TempSrc: Oral Oral Oral Oral  SpO2: 97% 93% 96% 93%  Weight:      Height:       CBC:  Recent Labs  Lab 02/15/20 1325 02/15/20 1333 02/16/20 0500  WBC 9.7  --  13.9*  NEUTROABS 6.3  --   --   HGB 18.3* 18.4* 16.9  HCT 55.2* 54.0* 48.6  MCV 86.4  --  85.7  PLT 192  --  462   Basic Metabolic Panel:  Recent Labs  Lab 02/15/20 1325 02/15/20 1333 02/16/20 0500  NA 139 139 137  K 4.4 3.9 3.7  CL 102 102 101  CO2 25  --  24  GLUCOSE 104* 104* 92  BUN 9 10 10   CREATININE 1.00 0.90 0.98  CALCIUM 9.6  --  8.8*   Lipid Panel:  Recent Labs  Lab 02/16/20 0500  CHOL 168  TRIG 80  HDL 41  CHOLHDL 4.1  VLDL 16  LDLCALC 111*   HgbA1c:  Recent Labs  Lab 02/16/20 0500  HGBA1C 5.2   Urine Drug Screen:  Recent Labs  Lab 02/15/20 1753  LABOPIA NONE DETECTED  COCAINSCRNUR NONE DETECTED  LABBENZ NONE DETECTED  AMPHETMU NONE DETECTED  THCU NONE  DETECTED  LABBARB NONE DETECTED    Alcohol Level No results for input(s): ETH in the last 168 hours.  IMAGING past 24 hours DG Swallowing Func-Speech Pathology  Result Date: 02/17/2020 Objective Swallowing Evaluation: Type of Study: MBS-Modified Barium Swallow Study  Patient Details Name: David Hendricks MRN: [id number] Date of Birth: 1950/09/01 Today's Date: 02/17/2020 Time: SLP Start Time (ACUTE ONLY): 1335 -SLP Stop Time (ACUTE ONLY): 1353 SLP Time Calculation (min) (ACUTE ONLY): 18 min Past Medical History: Past Medical History: Diagnosis Date . Allergy  . Anxiety  . Aortic atherosclerosis ([place])  . Arthritis  . Benign prostatic hyperplasia (BPH) with urinary urgency  . Chronic kidney disease   KIDNEY STONES . COPD (chronic obstructive pulmonary disease) ([place])   possible  . Coronary artery disease   10/18 PCI/DES to pLAD, mild nonobstructive disease in the Lcx/RCA. Normal EF.  [place] Kitchen Depression  . Dyslipidemia  . Dysrhythmia 2016  irregular heartbeat . Gallstones  . GERD (gastroesophageal reflux disease)  . History of kidney stones  . Hyperlipidemia  . Hyperthyroidism  . Leg cramps  . Multiple pulmonary nodules  .  Myocardial infarction ([place])   2018 . Pneumonia  . Seasonal allergies  . Thyroid nodule  . Vertigo  . Vitamin B 12 deficiency  Past Surgical History: Past Surgical History: Procedure Laterality Date . COLONOSCOPY   . CORONARY ANGIOPLASTY  10/09/2016 . CORONARY STENT INTERVENTION N/A 10/09/2016  Procedure: CORONARY STENT INTERVENTION;  Surgeon: Nelva Bush, MD;  Location: [place] [facility];  Service: Cardiovascular;  Laterality: N/A; . CYSTOSCOPY WITH RETROGRADE PYELOGRAM, URETEROSCOPY AND STENT PLACEMENT Left 06/17/2019  Procedure: CYSTOSCOPY WITH RETROGRADE PYELOGRAM, URETEROSCOPY AND STENT PLACEMENT;  Surgeon: Robley Fries, MD;  Location: [facility];  Service: Urology;  Laterality: Left;  1 HR . HOLMIUM LASER APPLICATION Left 02/11/252  Procedure: HOLMIUM LASER APPLICATION;  Surgeon: Robley Fries, MD;   Location: [facility];  Service: Urology;  Laterality: Left; . INTRAVASCULAR ULTRASOUND/IVUS N/A 10/09/2016  Procedure: Intravascular Ultrasound/IVUS;  Surgeon: Nelva Bush, MD;  Location: [place] [facility];  Service: Cardiovascular;  Laterality: N/A; . IR CHOLANGIOGRAM EXISTING TUBE  11/18/2018 . IR PERC CHOLECYSTOSTOMY  11/16/2018 . LEFT HEART CATH AND CORONARY ANGIOGRAPHY N/A 10/09/2016  Procedure: LEFT HEART CATH AND CORONARY ANGIOGRAPHY;  Surgeon: Nelva Bush, MD;  Location: [place] [facility];  Service: Cardiovascular;  Laterality: N/A; HPI: Pt is a 70 y/o male admitted secondary to slurred speech and L sided weakness. 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 PMH includes tobacco use, CAD, COPD, anxiety, and depression.  Subjective: pt lethargic but following commands Assessment / Plan / Recommendation CHL IP CLINICAL IMPRESSIONS 02/17/2020 Clinical Impression Pt presents with a significant oropharyngeal dysphagia that is also exacerbated by current level of alertness. He is quite lethargic throughout testing, although alert enough to follow commands and accept boluses consistently. Limited trials were given in light of severity of dysphagia and mentation. He has limited lingual control of boluses, allowing a spoonful of nectar thick liquids to spill immediately back into his laryngeal vestibule. Once it is there, he tries to achieve some laryngeal closure, which is successful at expelling some of the bolus back out of his airway, but he does aspirate some of it. Pt also has very limited base of tongue retraction, hyolaryngeal movement, and pharyngeal squeeze, so that almost an entire bolus of puree remains in his valleculae post-swallow. He cannot clear this on his own despite cues, with SLP also providing suction via yankauer to clear as much as possible. As this pureed residue seems to thin out by mixing with secretions, he begins to aspirate this as well. Recommend that pt remain NPO for now with  consideration of temporary, alternative means of nutrition with plans to retest swallowing function when more alert. MD in agreement with keeping pt NPO and also plans to involve palliative care.  SLP Visit Diagnosis Dysphagia, oropharyngeal phase (R13.12) Attention and concentration deficit following -- Frontal lobe and executive function deficit following -- Impact on safety and function Severe aspiration risk   CHL IP TREATMENT RECOMMENDATION 02/17/2020 Treatment Recommendations Therapy as outlined in treatment plan below   Prognosis 02/17/2020 Prognosis for Safe Diet Advancement Fair Barriers to Reach Goals Cognitive deficits;Severity of deficits Barriers/Prognosis Comment -- CHL IP DIET RECOMMENDATION 02/17/2020 SLP Diet Recommendations NPO;Alternative means - temporary Liquid Administration via -- Medication Administration Via alternative means Compensations -- Postural Changes --   CHL IP OTHER RECOMMENDATIONS 02/17/2020 Recommended Consults -- Oral Care Recommendations Oral care QID Other Recommendations Have oral suction available   CHL IP FOLLOW UP RECOMMENDATIONS 02/17/2020 Follow up Recommendations Inpatient Rehab  CHL IP FREQUENCY AND DURATION 02/17/2020 Speech Therapy Frequency (ACUTE ONLY) min 2x/week Treatment Duration 2 weeks      CHL IP ORAL PHASE 02/17/2020 Oral Phase Impaired Oral - Pudding Teaspoon -- Oral - Pudding Cup -- Oral - Honey Teaspoon -- Oral - Honey Cup -- Oral - Nectar Teaspoon Decreased bolus cohesion;Weak lingual manipulation Oral - Nectar Cup -- Oral - Nectar Straw -- Oral - Thin Teaspoon -- Oral - Thin Cup -- Oral - Thin Straw -- Oral - Puree Lingual/palatal residue;Weak lingual manipulation Oral - Mech Soft -- Oral - Regular -- Oral - Multi-Consistency -- Oral - Pill -- Oral Phase - Comment --  CHL IP PHARYNGEAL PHASE 02/17/2020 Pharyngeal Phase Impaired Pharyngeal- Pudding Teaspoon -- Pharyngeal -- Pharyngeal- Pudding Cup -- Pharyngeal -- Pharyngeal- Honey Teaspoon -- Pharyngeal --  Pharyngeal- Honey Cup -- Pharyngeal -- Pharyngeal- Nectar Teaspoon Reduced pharyngeal peristalsis;Reduced epiglottic inversion;Reduced anterior laryngeal mobility;Reduced laryngeal elevation;Reduced tongue base retraction;Pharyngeal residue - valleculae;Penetration/Aspiration before swallow Pharyngeal Material enters airway, passes BELOW cords without attempt by patient to eject out (silent aspiration) Pharyngeal- Nectar Cup -- Pharyngeal -- Pharyngeal- Nectar Straw -- Pharyngeal -- Pharyngeal- Thin Teaspoon -- Pharyngeal -- Pharyngeal- Thin Cup -- Pharyngeal -- Pharyngeal- Thin Straw -- Pharyngeal -- Pharyngeal- Puree Reduced pharyngeal peristalsis;Reduced epiglottic inversion;Reduced anterior laryngeal mobility;Reduced laryngeal elevation;Reduced tongue base retraction;Pharyngeal residue - valleculae;Penetration/Apiration after swallow Pharyngeal Material enters airway, passes BELOW cords without attempt by patient to eject out (silent aspiration) Pharyngeal- Mechanical Soft -- Pharyngeal -- Pharyngeal- Regular -- Pharyngeal -- Pharyngeal- Multi-consistency -- Pharyngeal -- Pharyngeal- Pill -- Pharyngeal -- Pharyngeal Comment --  CHL IP CERVICAL ESOPHAGEAL PHASE 02/17/2020 Cervical Esophageal Phase (No Data) Pudding Teaspoon -- Pudding Cup -- Honey Teaspoon -- Honey Cup -- Nectar Teaspoon -- Nectar Cup -- Nectar Straw -- Thin Teaspoon -- Thin Cup -- Thin Straw -- Puree -- Mechanical Soft -- Regular -- Multi-consistency -- Pill -- Cervical Esophageal Comment -- Osie Bond., M.A. [place] [facility] Pager [phone] Office [phone] 02/17/2020, 3:25 PM               PHYSICAL EXAM GENERAL: He is Alert and Oriented x4.  Oriented to self, and age, time and situation. knows month and year.  HEENT: - Normocephalic and atraumatic. LUNGS - Symmetrical chest rise, No labored breathing noted CV - no JVD, No Peripheral Edema ABDOMEN - Soft,  nondistended  Ext: warm, well perfused, intact peripheral  pulses, no Peripheral edema.  NEURO Exam:  Mental Status: He is Alert and Oriented x4.  Oriented to self, and age, time and situation. knows month and year.  Good Attention and concentration. Language: Speech is slurred.   Cranial Nerves:   CN II Pupils equal and reactive to light, VF full b/l   CN III,IV,VI EOM intact, no gaze preference or deviation, no nystagmus   CN V normal sensation in V1, V2, and V3 segments bilaterally    CN VII Lt sided facial droop present, no nasolabial fold flattening   CN VIII normal hearing to speech    CN IX & X normal palatal elevation, no uvular deviation    CN XI 5/5 head turn and 5/5 shoulder shrug bilaterally    CN XII midline tongue protrusion    Motor: No Drift in Upper Extremities, No Drift in LE's. Strength 5/5 in Rt UE, 4/5 in Lt UE with Weakness observed in Lt hand demonstrated by rapid finger tapping movements which is better than before. Strength in Rt LE  5/5, Lt LE  5/5 Tone: is normal and bulk is normal. Sensation- Intact to light touch bilaterally. Coordination: slowness observed on Left side on FTN test.  Gait- deferred  ASSESSMENT/PLAN [name] is a 70 y.o. male with history of HLD, CAD with hx MI, COPD, depression, tobacco abuse, GERD, nephrolithiasis, hyperthyroidism, and CKD presenting with slurred speech, Rt facial droop  and Left arm weakness. NIHSS was 2.  CT Head normal. CTA Head and Neck revealed the nondominant right vertebral artery is diminutive throughout its course and likely occluded intradurally, Diminutive vertebrobasilar system intracranially with bilateral fetal type PCAs, anatomic variant. MRI showed Acute cortical and subcortical infarct in the high right posterior frontal lobe.  Possible additional punctate infarct in the right parietal cortex versus artifact.  Vascular ultrasound lower extremities negative for DVT.  HbA1c 5.2, LDL 111.  ECHO done LVEF 60-65% , Aortic dilatation noted.  HbA1c 5.2, LDL 111. Pt Eval  recommends CIR with 24 hr supervision.  Barium swallow study revealed dysphagia in oropharyngeal phase. Pt is NPO. Pt is on IV fluids. Pt is on Asprin 81 mg and Plavix 75 mg but on hold as he was NPO. We ordered Aspirin suppository until pt is NPO. Loop recorder before discharge. No more neurological interventions needed. Neurology will sign off.   Stroke: Acute right MCA cortical and subcortical infarcts, concerning for embolic pattern, source unclear  CT Code Stroke- No evidence of acute large vascular territory infarct.   CTA head & neck- The nondominant right vertebral artery is diminutive throughout its course and likely occluded intradurally. Diminutive vertebrobasilar system intracranially with bilateral fetal type PCAs, anatomic variant.   MRI -with 6 today is am not sure dilatation to the same thing cortical and subcortical infarct in the high right posterior frontal lobe. Small focus of susceptibility artifact in this region may represent a small thrombosed distal cortical MCA Branch. Possible additional punctate infarct in the right parietal cortex versus artifact.  2D Echo -  LVEF 60-65%   Doppler of lower extremities - No DVT  Recommend loop recorder to rule out afib on discharge - EP aware  LDL 111  HgbA1c 5.2  VTE prophylaxis - SCD's  aspirin 81 mg daily prior to admission, now on aspirin 81 mg daily and clopidogrel 75 mg daily. Continue DAPT for 3 weeks and then plavix alone. (On Hold as Pt is NPO, Can restart if Pt passes swallow test).   ASA 300 mg suppository until he has po access.  Therapy recommendations: CIR   Disposition: pending  CAD with history of MI ? Arrhythmia   On ASA PTA  Now on DAPT for 3 weeks  Irregular heartbeat listed in the medical problems  Patient denies heart palpitation  Recommend loop recorder to rule out A. Fib on discharge  Dysphagia  Speech on board  Did not pass swallow  Currently NPO   MBS pending  On IV fluids  BP  management  Home meds: None  BP stable . Long-term BP goal normotensive  Hyperlipidemia  Home meds: Lipitor 80 mg, resumed in hospital  LDL 111, goal < 70  Continue statin at discharge  Agitation vs. sundowning  Agitation and aggressive overnight 2/7  S/p ativan 5am 2/8  Per RN, wife reported similar behavior in the past admission  Mental status much improved 2/9  Management per primary team  Tobacco  Use  Current every day smoker. 2 PPD x 59 years. 118 pack yr history.  Smoking cessation counseling provided Nicotine patch provided Pt  is willing to quit  Other Stroke Risk Factors  Advanced Age >/= 63   Other Active Problems  Hypothyroidism- TSH- 0.919  Depression  Nephrolithiasis  BPH- Home meds include Flomax  COPD- Home meds include albuterol, Budesonide inhaler  Hospital day # 3   ATTENDING NOTE: I reviewed above note and agree with the assessment and plan. Pt was seen and examined.   No family at bedside.  Patient lying in bed, awake alert, orientated, mental status much improved from yesterday.  No acute event or agitation or combative event overnight.  Still has moderate dysarthria, currently n.p.o.  Pending barium study and speech evaluation. Still has mild left arm drift and left hand dexterity and finger grip mild difficulty.  Left lower extremity strong.  Still has mild left facial droop. Continue aspirin 300 PR if no p.o. access.  Once p.o. access, consider to resume DAPT and statin.    PT/OT recommend CIR. Loop recorder placement before discharge.  EP aware.  Neurology will sign off. Please call with questions. Pt will follow up with stroke clinic NP at [facility] in about 4 weeks. Thanks for the consult.   Rosalin Hawking, MD PhD Stroke Neurology 02/18/2020 2:06 PM        To contact Stroke Continuity provider, please refer to [email or web address] After hours, contact General Neurology

## 2020-02-19 DIAGNOSIS — I69322 Dysarthria following cerebral infarction: Secondary | ICD-10-CM | POA: Diagnosis not present

## 2020-02-19 DIAGNOSIS — I63411 Cerebral infarction due to embolism of right middle cerebral artery: Secondary | ICD-10-CM

## 2020-02-19 DIAGNOSIS — Z720 Tobacco use: Secondary | ICD-10-CM | POA: Diagnosis not present

## 2020-02-19 DIAGNOSIS — I639 Cerebral infarction, unspecified: Secondary | ICD-10-CM | POA: Diagnosis not present

## 2020-02-19 DIAGNOSIS — Z515 Encounter for palliative care: Secondary | ICD-10-CM | POA: Diagnosis not present

## 2020-02-19 DIAGNOSIS — Z7189 Other specified counseling: Secondary | ICD-10-CM

## 2020-02-19 LAB — CBC
HCT: 49.3 % (ref 39.0–52.0)
Hemoglobin: 16.4 g/dL (ref 13.0–17.0)
MCH: 28.8 pg (ref 26.0–34.0)
MCHC: 33.3 g/dL (ref 30.0–36.0)
MCV: 86.5 fL (ref 80.0–100.0)
Platelets: 160 10*3/uL (ref 150–400)
RBC: 5.7 MIL/uL (ref 4.22–5.81)
RDW: 12.6 % (ref 11.5–15.5)
WBC: 9.8 10*3/uL (ref 4.0–10.5)
nRBC: 0 % (ref 0.0–0.2)

## 2020-02-19 LAB — BASIC METABOLIC PANEL
Anion gap: 12 (ref 5–15)
BUN: 11 mg/dL (ref 8–23)
CO2: 23 mmol/L (ref 22–32)
Calcium: 8.4 mg/dL — ABNORMAL LOW (ref 8.9–10.3)
Chloride: 102 mmol/L (ref 98–111)
Creatinine, Ser: 0.85 mg/dL (ref 0.61–1.24)
GFR, Estimated: >60 mL/min (ref >60)
Glucose, Bld: 83 mg/dL (ref 70–99)
Potassium: 3.6 mmol/L (ref 3.5–5.1)
Sodium: 137 mmol/L (ref 135–145)

## 2020-02-19 MED ORDER — PANTOPRAZOLE SODIUM 40 MG PO TBEC
40.0000 mg | DELAYED_RELEASE_TABLET | Freq: Two times a day (BID) | ORAL | Status: DC
Start: 2020-02-19 — End: 2020-02-24
  Administered 2020-02-19 – 2020-02-24 (×10): 40 mg via ORAL
  Filled 2020-02-19 (×11): qty 1

## 2020-02-19 MED ORDER — PANTOPRAZOLE SODIUM 40 MG PO TBEC
40.0000 mg | DELAYED_RELEASE_TABLET | Freq: Every day | ORAL | Status: DC
  Administered 2020-02-19: 40 mg via ORAL

## 2020-02-19 NOTE — Consult Note (Signed)
Physical Medicine and Rehabilitation Consult Reason for Consult: Left side weakness and slurred speech Referring Physician: [facility]   HPI: David Hendricks is a 70 y.o. right-handed male with history of tobacco abuse, COPD, CAD with stenting maintained on aspirin, hyperlipidemia, hyperthyroidism, BPH, anxiety with depression.  Per chart review patient lives alone.  Reportedly independent prior to admission.  1 level apartment.  Presented to 06/28/2020 with left-sided weakness and slurred speech.  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  Small focus of susceptibility artifact in this region representing small thrombosed distal cortical MCA branch.  Possible additional punctate infarct in the right parietal cortex versus infarct.  No substantial edema or mass-effect.  CT angiogram of head and neck the nondominant right vertebral artery is diminutive throughout its course and likely occluded intradurally otherwise no large vessel occlusion or proximal hemodynamically significant stenosis.  Admission chemistries unremarkable except glucose 104, SARS coronavirus negative, hemoglobin 18.3, TSH 0.919, urine drug screen negative.  Echocardiogram ejection fraction of 60 to 65% no wall motion abnormalities grade 1 diastolic dysfunction.  Patient currently maintained on aspirin and Plavix for CVA prophylaxis x3 weeks then Plavix alone.  Dysphagia #1 honey thick liquid diet.  Therapy evaluations completed due to patient's left-sided weakness slurred speech recommendations for physical medicine rehab consult.   Pt swears there are friends that can help him when he goes home.  Pt reports LBM last night- voiding well with condom catheter.  Also, notes is so sleepy sometimes, falls asleep when talking.  Neck sensitive, chronic- not painful.  Can't tell if leaning per pt- per PT he's leaning dramatically to Left, but better than 2 days ago.     Review of Systems   Constitutional: Negative for chills and fever.  HENT: Negative for hearing loss.   Eyes: Negative for blurred vision and double vision.  Respiratory: Negative for cough and shortness of breath.   Cardiovascular: Negative for chest pain, palpitations and leg swelling.  Gastrointestinal: Positive for constipation. Negative for heartburn, nausea and vomiting.       GERD  Genitourinary: Positive for urgency. Negative for dysuria and hematuria.  Musculoskeletal: Positive for myalgias.  Skin: Negative for rash.  Neurological: Positive for speech change and weakness.       Vertigo  Psychiatric/Behavioral: Positive for depression.       Anxiety  All other systems reviewed and are negative.  Past Medical History:  Diagnosis Date  . Allergy   . Anxiety   . Aortic atherosclerosis ([place])   . Arthritis   . Benign prostatic hyperplasia (BPH) with urinary urgency   . Chronic kidney disease    KIDNEY STONES  . COPD (chronic obstructive pulmonary disease) ([place])    possible   . Coronary artery disease    10/18 PCI/DES to pLAD, mild nonobstructive disease in the Lcx/RCA. Normal EF.   Marland Kitchen Depression   . Dyslipidemia   . Dysrhythmia 2016   irregular heartbeat  . Gallstones   . GERD (gastroesophageal reflux disease)   . History of kidney stones   . Hyperlipidemia   . Hyperthyroidism   . Leg cramps   . Multiple pulmonary nodules   . Myocardial infarction ([place])    2018  . Pneumonia   . Seasonal allergies   . Thyroid nodule   . Vertigo   . Vitamin B 12 deficiency    Past Surgical History:  Procedure Laterality Date  . COLONOSCOPY    .  CORONARY ANGIOPLASTY  10/09/2016  . CORONARY STENT INTERVENTION N/A 10/09/2016   Procedure: CORONARY STENT INTERVENTION;  Surgeon: Nelva Bush, MD;  Location: [facility];  Service: Cardiovascular;  Laterality: N/A;  . CYSTOSCOPY WITH RETROGRADE PYELOGRAM, URETEROSCOPY AND STENT PLACEMENT Left 06/17/2019   Procedure: CYSTOSCOPY WITH RETROGRADE  PYELOGRAM, URETEROSCOPY AND STENT PLACEMENT;  Surgeon: Robley Fries, MD;  Location: [facility];  Service: Urology;  Laterality: Left;  1 HR  . HOLMIUM LASER APPLICATION Left 01/15/9676   Procedure: HOLMIUM LASER APPLICATION;  Surgeon: Robley Fries, MD;  Location: [facility];  Service: Urology;  Laterality: Left;  . INTRAVASCULAR ULTRASOUND/IVUS N/A 10/09/2016   Procedure: Intravascular Ultrasound/IVUS;  Surgeon: Nelva Bush, MD;  Location: [facility];  Service: Cardiovascular;  Laterality: N/A;  . IR CHOLANGIOGRAM EXISTING TUBE  11/18/2018  . IR PERC CHOLECYSTOSTOMY  11/16/2018  . LEFT HEART CATH AND CORONARY ANGIOGRAPHY N/A 10/09/2016   Procedure: LEFT HEART CATH AND CORONARY ANGIOGRAPHY;  Surgeon: Nelva Bush, MD;  Location: [facility];  Service: Cardiovascular;  Laterality: N/A;   Family History  Problem Relation Age of Onset  . Alcohol abuse Father        called [place] per pt  . Cancer Father        OAK CELL CANCER PER PT NON SURGICAL   . Colon cancer Neg Hx   . Colon polyps Neg Hx   . Esophageal cancer Neg Hx   . Rectal cancer Neg Hx   . Stomach cancer Neg Hx    Social History:  reports that he has been smoking cigarettes. He started smoking about 59 years ago. He has a 90.00 pack-year smoking history. He has never used smokeless tobacco. He reports that he does not drink alcohol and does not use drugs. Allergies: No Known Allergies Facility-Administered Medications Prior to Admission  Medication Dose Route Frequency Provider Last Rate Last Admin  . cyanocobalamin ((VITAMIN B-12)) injection 1,000 mcg  1,000 mcg Intramuscular Q30 days Horald Pollen, MD   1,000 mcg at 08/29/19 1525   Medications Prior to Admission  Medication Sig Dispense Refill  . albuterol (VENTOLIN HFA) 108 (90 Base) MCG/ACT inhaler INHALE 2 PUFFS INTO THE LUNGS EVERY 6 HOURS AS NEEDED (Patient taking differently: Inhale 2 puffs into the lungs every 6 (six) hours as needed  for wheezing or shortness of breath.) 18 g 5  . aspirin EC 81 MG tablet Take 81 mg by mouth daily.    Marland Kitchen atorvastatin (LIPITOR) 80 MG tablet Take 1 tablet (80 mg total) by mouth daily. 90 tablet 3  . cyclobenzaprine (FLEXERIL) 5 MG tablet Take 5 mg by mouth 3 (three) times daily as needed for muscle spasms.    Marland Kitchen gabapentin (NEURONTIN) 100 MG capsule Take 100 mg by mouth 3 (three) times daily as needed (pain).    . methimazole (TAPAZOLE) 5 MG tablet TAKE 1 TABLET(5 MG) BY MOUTH DAILY (Patient taking differently: Take 5 mg by mouth daily.) 90 tablet 1  . naproxen sodium (ALEVE) 220 MG tablet Take 220 mg by mouth daily as needed (mild pain).    . ondansetron (ZOFRAN) 4 MG tablet Take 1 tablet (4 mg total) by mouth every 6 (six) hours as needed for nausea or vomiting. 20 tablet 0  . OVER THE COUNTER MEDICATION Take 1 tablet by mouth daily as needed (nasal congestion). [employer] brand    . oxyCODONE-acetaminophen (PERCOCET) 5-325 MG tablet Take 1 tablet by mouth every 4 (  four) hours as needed. (Patient taking differently: Take 1-1.5 tablets by mouth every 4 (four) hours as needed for moderate pain.) 20 tablet 0  . pantoprazole (PROTONIX) 40 MG tablet TAKE 1 TABLET BY MOUTH EVERY DAY (Patient taking differently: Take 40 mg by mouth daily as needed (heartburn).) 30 tablet 0  . tamsulosin (FLOMAX) 0.4 MG CAPS capsule Take 0.4 mg by mouth daily.     . traMADol (ULTRAM) 50 MG tablet Take 50 mg by mouth every 6 (six) hours as needed for moderate pain.    . traZODone (DESYREL) 50 MG tablet Take 50 mg by mouth at bedtime.    . budesonide-formoterol (SYMBICORT) 80-4.5 MCG/ACT inhaler Inhale 2 puffs into the lungs 2 (two) times daily. (Patient not taking: Reported on 02/15/2020) 1 Inhaler 3    Home: Home Living Family/patient expects to be discharged to:: Private residence Living Arrangements: Alone Available Help at Discharge: Other (Comment) Type of Home: Apartment Home Access: Level entry Home Layout:  One level Bathroom Shower/Tub: Chiropodist: Standard Bathroom Accessibility: Yes Home Equipment: None Additional Comments: Info taken from PT eval  Lives With: Alone  Functional History: Prior Function Level of Independence: Independent Functional Status:  Mobility: Bed Mobility Overal bed mobility: Needs Assistance Bed Mobility: Sit to Supine Supine to sit: Min assist,+2 for physical assistance Sit to supine: Max assist,+2 for physical assistance General bed mobility comments: maxA+2 for trunk elevation and BLE movement Transfers Overall transfer level: Needs assistance Equipment used: Ambulation equipment used Transfer via Lift Equipment: Stedy Transfers: Sit to/from Stand Sit to Stand: +2 physical assistance,Max assist General transfer comment: MaxA+2 to power up to standing in the steady, heavy lean to with poor awareness of midline once up; recliner to bed with stedy +2 assist Ambulation/Gait General Gait Details: unable    ADL: ADL Overall ADL's : Needs assistance/impaired Eating/Feeding: NPO Grooming: Maximal assistance,Wash/dry face,Sitting Grooming Details (indicate cue type and reason): washing face Upper Body Bathing: Maximal assistance,Sitting Lower Body Bathing: Total assistance,Cueing for safety,Sitting/lateral leans,Sit to/from stand Upper Body Dressing : Moderate assistance,Sitting Upper Body Dressing Details (indicate cue type and reason): donning gown Lower Body Dressing: Total assistance,Sitting/lateral leans,Sit to/from stand,Cueing for safety Toilet Transfer: Maximal assistance,+2 for physical assistance,+2 for safety/equipment,Cueing for safety,Cueing for sequencing Toilet Transfer Details (indicate cue type and reason): with larger stedy Toileting- Clothing Manipulation and Hygiene: Total assistance,Sitting/lateral lean,Sit to/from stand,Cueing for safety Toileting - Clothing Manipulation Details (indicate cue type and reason):  condom catheter on Functional mobility during ADLs: Maximal assistance,+2 for physical assistance,+2 for safety/equipment,Cueing for safety,Cueing for sequencing General ADL Comments: Session focused on getting pt back to bed due to MBS this PM. Pt requiring cues to follow for sequencing through task. Pt very lethargic and once lying in bed, pt was fast asleep.  Cognition: Cognition Overall Cognitive Status: No family/caregiver present to determine baseline cognitive functioning Arousal/Alertness: Awake/alert Orientation Level: Oriented to person,Oriented to place,Disoriented to time,Disoriented to situation Attention: Sustained Sustained Attention: Impaired Sustained Attention Impairment: Verbal basic Memory: Impaired Memory Impairment: Storage deficit,Retrieval deficit,Decreased recall of new information Awareness: Impaired Awareness Impairment: Intellectual impairment,Emergent impairment Safety/Judgment: Impaired Cognition Arousal/Alertness: Lethargic Behavior During Therapy: Flat affect Overall Cognitive Status: No family/caregiver present to determine baseline cognitive functioning General Comments: Pt following simple 1 step commands and opening eyes every so often, but not consistently, espeically on L side. Pt agitated with RN in room prior to arrival, but PT/OT introducing selves and pt wanting to get OOB to recliner and pt relaxed. Pt was  not comabtive, but did appear to have L facial droop and slurred speech with cognitive deficits noted.  Blood pressure 120/66, pulse 87, temperature 98.4 F (36.9 C), temperature source Oral, resp. rate 20, height 6\' 1"  (1.854 m), weight 99.8 kg, SpO2 92 %. Physical Exam Vitals and nursing note reviewed. Exam conducted with a chaperone present.  Constitutional:      Comments: Sitting EOB- transferred with min A of 2 therapy using sara steady to bedside chair, O2 was off, but O2 sats dropped to 86% shortly, so put O2 back on, drooling from L  side of mouth- a lot, NAD Both therapists in room  HENT:     Head: Normocephalic and atraumatic.     Comments: L facial droop- cannot control secretions out of left side of mouth Tongue appears L sided    Right Ear: External ear normal.     Left Ear: External ear normal.     Nose:     Comments: Wearing O2 2L- sats got back up to 93-94%    Mouth/Throat:     Mouth: Mucous membranes are moist.     Pharynx: Oropharyngeal exudate present.  Eyes:     General:        Right eye: No discharge.        Left eye: No discharge.     Extraocular Movements: Extraocular movements intact.     Conjunctiva/sclera: Conjunctivae normal.  Neck:     Comments: Neck very sensitive to even whisper type touch Cardiovascular:     Rate and Rhythm: Normal rate and regular rhythm.     Heart sounds: Normal heart sounds. No murmur heard. No gallop.   Pulmonary:     Comments: CTA B/L except when swallowing drool- got a little coarse, but cleared immediately; - no W/R/R- good air movement Abdominal:     Comments: Soft, NT, ND, (+)BS hypoactive  Genitourinary:    Comments: Condom catheter in place- light to medium amber urine Musculoskeletal:     Comments: RUE- 5-/5 LUE- bicep/tricep 3/5, grip 3-/5, finger abd 2+/5 RLE- 5-/5 LLE- 4/5 in HF, KE, DF and PF Was leaning to left while sitting EOB with 1-2 person assist to maintain sitting- required min A of 2 to sit-stand to steady  Skin:    Comments: No skin breakdown seen- IV looks OK- looked at backside,   Neurological:     Comments: Patient is awake alert no acute distress.  Makes eye contact with examiner follows commands.  Oriented to person place and time.  Speech is dysarthric but intelligible.  Very hard to understand due to dysarthria- says intact to light touch in all 4 extremities, however couldn't tell was leaning to left- was not pushing.   Psychiatric:     Comments: Joking occ     Results for orders placed or performed during the hospital  encounter of 02/15/20 (from the past 24 hour(s))  Basic metabolic panel     Status: Abnormal   Collection Time: 02/19/20  2:18 AM  Result Value Ref Range   Sodium 137 135 - 145 mmol/L   Potassium 3.6 3.5 - 5.1 mmol/L   Chloride 102 98 - 111 mmol/L   CO2 23 22 - 32 mmol/L   Glucose, Bld 83 70 - 99 mg/dL   BUN 11 8 - 23 mg/dL   Creatinine, Ser 0.85 0.61 - 1.24 mg/dL   Calcium 8.4 (L) 8.9 - 10.3 mg/dL   GFR, Estimated >60 >60 mL/min   Anion gap 12  5 - 15  CBC     Status: None   Collection Time: 02/19/20  2:18 AM  Result Value Ref Range   WBC 9.8 4.0 - 10.5 K/uL   RBC 5.70 4.22 - 5.81 MIL/uL   Hemoglobin 16.4 13.0 - 17.0 g/dL   HCT 49.3 39.0 - 52.0 %   MCV 86.5 80.0 - 100.0 fL   MCH 28.8 26.0 - 34.0 pg   MCHC 33.3 30.0 - 36.0 g/dL   RDW 12.6 11.5 - 15.5 %   Platelets 160 150 - 400 K/uL   nRBC 0.0 0.0 - 0.2 %   DG Swallowing Func-Speech Pathology  Result Date: 02/18/2020 Objective Swallowing Evaluation: Type of Study: MBS-Modified Barium Swallow Study  Patient Details Name: David Hendricks MRN: [id number] Date of Birth: May 30, 1950 Today's Date: 02/18/2020 Time: SLP Start Time (ACUTE ONLY): 1409 -SLP Stop Time (ACUTE ONLY): 1427 SLP Time Calculation (min) (ACUTE ONLY): 18 min Past Medical History: Past Medical History: Diagnosis Date . Allergy  . Anxiety  . Aortic atherosclerosis ([place])  . Arthritis  . Benign prostatic hyperplasia (BPH) with urinary urgency  . Chronic kidney disease   KIDNEY STONES . COPD (chronic obstructive pulmonary disease) ([place])   possible  . Coronary artery disease   10/18 PCI/DES to pLAD, mild nonobstructive disease in the Lcx/RCA. Normal EF.  Marland Kitchen Depression  . Dyslipidemia  . Dysrhythmia 2016  irregular heartbeat . Gallstones  . GERD (gastroesophageal reflux disease)  . History of kidney stones  . Hyperlipidemia  . Hyperthyroidism  . Leg cramps  . Multiple pulmonary nodules  . Myocardial infarction ([place])   2018 . Pneumonia  . Seasonal allergies  . Thyroid nodule  . Vertigo  .  Vitamin B 12 deficiency  Past Surgical History: Past Surgical History: Procedure Laterality Date . COLONOSCOPY   . CORONARY ANGIOPLASTY  10/09/2016 . CORONARY STENT INTERVENTION N/A 10/09/2016  Procedure: CORONARY STENT INTERVENTION;  Surgeon: Nelva Bush, MD;  Location: [facility];  Service: Cardiovascular;  Laterality: N/A; . CYSTOSCOPY WITH RETROGRADE PYELOGRAM, URETEROSCOPY AND STENT PLACEMENT Left 06/17/2019  Procedure: CYSTOSCOPY WITH RETROGRADE PYELOGRAM, URETEROSCOPY AND STENT PLACEMENT;  Surgeon: Robley Fries, MD;  [facility];  Service: Urology;  Laterality: Left;  1 HR . HOLMIUM LASER APPLICATION Left 0/01/6008  Procedure: HOLMIUM LASER APPLICATION;  Surgeon: Robley Fries, MD;  [facility];  Service: Urology;  Laterality: Left; . INTRAVASCULAR ULTRASOUND/IVUS N/A 10/09/2016  Procedure: Intravascular Ultrasound/IVUS;  Surgeon: Nelva Bush, MD;  Location: [facility];  Service: Cardiovascular;  Laterality: N/A; . IR CHOLANGIOGRAM EXISTING TUBE  11/18/2018 . IR PERC CHOLECYSTOSTOMY  11/16/2018 . LEFT HEART CATH AND CORONARY ANGIOGRAPHY N/A 10/09/2016  Procedure: LEFT HEART CATH AND CORONARY ANGIOGRAPHY;  Surgeon: Nelva Bush, MD;  Location: [facility];  Service: Cardiovascular;  Laterality: N/A; HPI: Pt is a 70 y/o male admitted secondary to slurred speech and L sided weakness. 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 PMH includes tobacco use, CAD, COPD, anxiety, and depression.  Subjective: alert, cooperative, dysarthric Assessment / Plan / Recommendation CHL IP CLINICAL IMPRESSIONS 02/18/2020 Clinical Impression Although pt presents with similar areas of weakness compared to yesterday's MBS, his improved mentation has a positive impact on his performance and ability to incorporate swallowing strategies. Head turns do not have a significant effect, but he has mildly improved vallecular clearance with use of a chin tuck and hard swallows. Residue is  still significant, but he demonstrated some awareness when he spontaneously  brought most of the residue back into his mouth to swallow it again, clearing more of it on the second try. On subsequent swallows, pt could be cued to do the same. Penetration and trace aspiration occurs with nectar thick liquids that still spill into the airway before he swallows, but no aspiration is observed with honey thick liquids or purees. There is trace amounts of penetration that is not always fully cleared, and when also considering the amount of vallecular residue he has, he would still remain at risk for some aspiration across the duration of a meal. Discussed with pt and MD, who is in agreement with starting modified diet with use of precautions to reduce but not eliminate aspiration risk while palliative care consult is pending. Therefore, will start Dys 1 diet and honey thick liquids with the following  precautions: chin tuck, alternate solids/liquids, hard swallows, cue to bring food back into his mouth and swallow again.  SLP Visit Diagnosis Dysphagia, oropharyngeal phase (R13.12) Attention and concentration deficit following -- Frontal lobe and executive function deficit following -- Impact on safety and function Moderate aspiration risk   CHL IP TREATMENT RECOMMENDATION 02/18/2020 Treatment Recommendations Therapy as outlined in treatment plan below   Prognosis 02/18/2020 Prognosis for Safe Diet Advancement Good Barriers to Reach Goals Cognitive deficits;Severity of deficits Barriers/Prognosis Comment -- CHL IP DIET RECOMMENDATION 02/18/2020 SLP Diet Recommendations Dysphagia 1 (Puree) solids;Honey thick liquids Liquid Administration via Cup Medication Administration Crushed with puree Compensations Minimize environmental distractions;Slow rate;Follow solids with liquid;Chin tuck;Other (Comment) Postural Changes Seated upright at 90 degrees;Remain semi-upright after after feeds/meals (Comment)   CHL IP OTHER RECOMMENDATIONS  02/18/2020 Recommended Consults -- Oral Care Recommendations Oral care QID Other Recommendations Have oral suction available;Order thickener from pharmacy;Prohibited food (jello, ice cream, thin soups);Remove water pitcher   CHL IP FOLLOW UP RECOMMENDATIONS 02/18/2020 Follow up Recommendations Inpatient Rehab   CHL IP FREQUENCY AND DURATION 02/18/2020 Speech Therapy Frequency (ACUTE ONLY) min 2x/week Treatment Duration 2 weeks      CHL IP ORAL PHASE 02/18/2020 Oral Phase Impaired Oral - Pudding Teaspoon -- Oral - Pudding Cup -- Oral - Honey Teaspoon -- Oral - Honey Cup Reduced posterior propulsion;Decreased bolus cohesion;Lingual/palatal residue;Weak lingual manipulation Oral - Nectar Teaspoon Reduced posterior propulsion;Decreased bolus cohesion;Lingual/palatal residue;Weak lingual manipulation Oral - Nectar Cup -- Oral - Nectar Straw -- Oral - Thin Teaspoon -- Oral - Thin Cup -- Oral - Thin Straw -- Oral - Puree Reduced posterior propulsion;Decreased bolus cohesion;Lingual/palatal residue;Weak lingual manipulation Oral - Mech Soft -- Oral - Regular -- Oral - Multi-Consistency -- Oral - Pill -- Oral Phase - Comment --  CHL IP PHARYNGEAL PHASE 02/18/2020 Pharyngeal Phase Impaired Pharyngeal- Pudding Teaspoon -- Pharyngeal -- Pharyngeal- Pudding Cup -- Pharyngeal -- Pharyngeal- Honey Teaspoon -- Pharyngeal -- Pharyngeal- Honey Cup Reduced pharyngeal peristalsis;Reduced epiglottic inversion;Reduced anterior laryngeal mobility;Reduced laryngeal elevation;Reduced tongue base retraction;Pharyngeal residue - valleculae;Penetration/Aspiration before swallow Pharyngeal Material enters airway, remains ABOVE vocal cords and not ejected out Pharyngeal- Nectar Teaspoon Reduced pharyngeal peristalsis;Reduced epiglottic inversion;Reduced anterior laryngeal mobility;Reduced laryngeal elevation;Reduced tongue base retraction;Pharyngeal residue - valleculae;Penetration/Aspiration before swallow Pharyngeal Material enters airway, passes BELOW  cords without attempt by patient to eject out (silent aspiration) Pharyngeal- Nectar Cup -- Pharyngeal -- Pharyngeal- Nectar Straw -- Pharyngeal -- Pharyngeal- Thin Teaspoon -- Pharyngeal -- Pharyngeal- Thin Cup -- Pharyngeal -- Pharyngeal- Thin Straw -- Pharyngeal -- Pharyngeal- Puree Reduced pharyngeal peristalsis;Reduced epiglottic inversion;Reduced anterior laryngeal mobility;Reduced laryngeal elevation;Reduced tongue base retraction;Pharyngeal residue - valleculae;Penetration/Apiration after swallow Pharyngeal Material enters  airway, remains ABOVE vocal cords then ejected out Pharyngeal- Mechanical Soft -- Pharyngeal -- Pharyngeal- Regular -- Pharyngeal -- Pharyngeal- Multi-consistency -- Pharyngeal -- Pharyngeal- Pill -- Pharyngeal -- Pharyngeal Comment --  CHL IP CERVICAL ESOPHAGEAL PHASE 02/18/2020 Cervical Esophageal Phase WFL Pudding Teaspoon -- Pudding Cup -- Honey Teaspoon -- Honey Cup -- Nectar Teaspoon -- Nectar Cup -- Nectar Straw -- Thin Teaspoon -- Thin Cup -- Thin Straw -- Puree -- Mechanical Soft -- Regular -- Multi-consistency -- Pill -- Cervical Esophageal Comment -- Osie Bond., M.A. CCC-SLP Acute Rehabilitation Services Pager [phone] Office [phone] 02/18/2020, 4:35 PM              DG Swallowing Func-Speech Pathology  Result Date: 02/17/2020 Objective Swallowing Evaluation: Type of Study: MBS-Modified Barium Swallow Study  Patient Details Name: David Hendricks MRN: [id number] Date of Birth: 01-12-1950 Today's Date: 02/17/2020 Time: SLP Start Time (ACUTE ONLY): 1335 -SLP Stop Time (ACUTE ONLY): 1353 SLP Time Calculation (min) (ACUTE ONLY): 18 min Past Medical History: Past Medical History: Diagnosis Date . Allergy  . Anxiety  . Aortic atherosclerosis ([place])  . Arthritis  . Benign prostatic hyperplasia (BPH) with urinary urgency  . Chronic kidney disease   KIDNEY STONES . COPD (chronic obstructive pulmonary disease) ([place])   possible  . Coronary artery disease   10/18 PCI/DES to pLAD, mild  nonobstructive disease in the Lcx/RCA. Normal EF.  Marland Kitchen Depression  . Dyslipidemia  . Dysrhythmia 2016  irregular heartbeat . Gallstones  . GERD (gastroesophageal reflux disease)  . History of kidney stones  . Hyperlipidemia  . Hyperthyroidism  . Leg cramps  . Multiple pulmonary nodules  . Myocardial infarction ([place])   2018 . Pneumonia  . Seasonal allergies  . Thyroid nodule  . Vertigo  . Vitamin B 12 deficiency  Past Surgical History: Past Surgical History: Procedure Laterality Date . COLONOSCOPY   . CORONARY ANGIOPLASTY  10/09/2016 . CORONARY STENT INTERVENTION N/A 10/09/2016  Procedure: CORONARY STENT INTERVENTION;  Surgeon: Nelva Bush, MD;  Location: [facility];  Service: Cardiovascular;  Laterality: N/A; . CYSTOSCOPY WITH RETROGRADE PYELOGRAM, URETEROSCOPY AND STENT PLACEMENT Left 06/17/2019  Procedure: CYSTOSCOPY WITH RETROGRADE PYELOGRAM, URETEROSCOPY AND STENT PLACEMENT;  Surgeon: Robley Fries, MD;  Location: [facility];  Service: Urology;  Laterality: Left;  1 HR . HOLMIUM LASER APPLICATION Left 08/09/173  Procedure: HOLMIUM LASER APPLICATION;  Surgeon: Robley Fries, MD;  Location: [facility];  Service: Urology;  Laterality: Left; . INTRAVASCULAR ULTRASOUND/IVUS N/A 10/09/2016  Procedure: Intravascular Ultrasound/IVUS;  Surgeon: Nelva Bush, MD;  Location: [facility];  Service: Cardiovascular;  Laterality: N/A; . IR CHOLANGIOGRAM EXISTING TUBE  11/18/2018 . IR PERC CHOLECYSTOSTOMY  11/16/2018 . LEFT HEART CATH AND CORONARY ANGIOGRAPHY N/A 10/09/2016  Procedure: LEFT HEART CATH AND CORONARY ANGIOGRAPHY;  Surgeon: Nelva Bush, MD;  Location: [facility];  Service: Cardiovascular;  Laterality: N/A; HPI: Pt is a 70 y/o male admitted secondary to slurred speech and L sided weakness. 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 PMH includes tobacco use, CAD, COPD, anxiety, and depression.  Subjective: pt lethargic but following commands Assessment / Plan / Recommendation CHL IP  CLINICAL IMPRESSIONS 02/17/2020 Clinical Impression Pt presents with a significant oropharyngeal dysphagia that is also exacerbated by current level of alertness. He is quite lethargic throughout testing, although alert enough to follow commands and accept boluses consistently. Limited trials were given in light of severity of dysphagia and mentation. He has limited lingual control of boluses, allowing a  spoonful of nectar thick liquids to spill immediately back into his laryngeal vestibule. Once it is there, he tries to achieve some laryngeal closure, which is successful at expelling some of the bolus back out of his airway, but he does aspirate some of it. Pt also has very limited base of tongue retraction, hyolaryngeal movement, and pharyngeal squeeze, so that almost an entire bolus of puree remains in his valleculae post-swallow. He cannot clear this on his own despite cues, with SLP also providing suction via yankauer to clear as much as possible. As this pureed residue seems to thin out by mixing with secretions, he begins to aspirate this as well. Recommend that pt remain NPO for now with consideration of temporary, alternative means of nutrition with plans to retest swallowing function when more alert. MD in agreement with keeping pt NPO and also plans to involve palliative care.  SLP Visit Diagnosis Dysphagia, oropharyngeal phase (R13.12) Attention and concentration deficit following -- Frontal lobe and executive function deficit following -- Impact on safety and function Severe aspiration risk   CHL IP TREATMENT RECOMMENDATION 02/17/2020 Treatment Recommendations Therapy as outlined in treatment plan below   Prognosis 02/17/2020 Prognosis for Safe Diet Advancement Fair Barriers to Reach Goals Cognitive deficits;Severity of deficits Barriers/Prognosis Comment -- CHL IP DIET RECOMMENDATION 02/17/2020 SLP Diet Recommendations NPO;Alternative means - temporary Liquid Administration via -- Medication Administration Via  alternative means Compensations -- Postural Changes --   CHL IP OTHER RECOMMENDATIONS 02/17/2020 Recommended Consults -- Oral Care Recommendations Oral care QID Other Recommendations Have oral suction available   CHL IP FOLLOW UP RECOMMENDATIONS 02/17/2020 Follow up Recommendations Inpatient Rehab   CHL IP FREQUENCY AND DURATION 02/17/2020 Speech Therapy Frequency (ACUTE ONLY) min 2x/week Treatment Duration 2 weeks      CHL IP ORAL PHASE 02/17/2020 Oral Phase Impaired Oral - Pudding Teaspoon -- Oral - Pudding Cup -- Oral - Honey Teaspoon -- Oral - Honey Cup -- Oral - Nectar Teaspoon Decreased bolus cohesion;Weak lingual manipulation Oral - Nectar Cup -- Oral - Nectar Straw -- Oral - Thin Teaspoon -- Oral - Thin Cup -- Oral - Thin Straw -- Oral - Puree Lingual/palatal residue;Weak lingual manipulation Oral - Mech Soft -- Oral - Regular -- Oral - Multi-Consistency -- Oral - Pill -- Oral Phase - Comment --  CHL IP PHARYNGEAL PHASE 02/17/2020 Pharyngeal Phase Impaired Pharyngeal- Pudding Teaspoon -- Pharyngeal -- Pharyngeal- Pudding Cup -- Pharyngeal -- Pharyngeal- Honey Teaspoon -- Pharyngeal -- Pharyngeal- Honey Cup -- Pharyngeal -- Pharyngeal- Nectar Teaspoon Reduced pharyngeal peristalsis;Reduced epiglottic inversion;Reduced anterior laryngeal mobility;Reduced laryngeal elevation;Reduced tongue base retraction;Pharyngeal residue - valleculae;Penetration/Aspiration before swallow Pharyngeal Material enters airway, passes BELOW cords without attempt by patient to eject out (silent aspiration) Pharyngeal- Nectar Cup -- Pharyngeal -- Pharyngeal- Nectar Straw -- Pharyngeal -- Pharyngeal- Thin Teaspoon -- Pharyngeal -- Pharyngeal- Thin Cup -- Pharyngeal -- Pharyngeal- Thin Straw -- Pharyngeal -- Pharyngeal- Puree Reduced pharyngeal peristalsis;Reduced epiglottic inversion;Reduced anterior laryngeal mobility;Reduced laryngeal elevation;Reduced tongue base retraction;Pharyngeal residue - valleculae;Penetration/Apiration after swallow  Pharyngeal Material enters airway, passes BELOW cords without attempt by patient to eject out (silent aspiration) Pharyngeal- Mechanical Soft -- Pharyngeal -- Pharyngeal- Regular -- Pharyngeal -- Pharyngeal- Multi-consistency -- Pharyngeal -- Pharyngeal- Pill -- Pharyngeal -- Pharyngeal Comment --  CHL IP CERVICAL ESOPHAGEAL PHASE 02/17/2020 Cervical Esophageal Phase (No Data) Pudding Teaspoon -- Pudding Cup -- Honey Teaspoon -- Honey Cup -- Nectar Teaspoon -- Nectar Cup -- Nectar Straw -- Thin Teaspoon -- Thin Cup -- Thin Straw -- Puree --  Mechanical Soft -- Regular -- Multi-consistency -- Pill -- Cervical Esophageal Comment -- Osie Bond., M.A. CCC-SLP Acute Rehabilitation Services Pager [phone] Office [phone] 02/17/2020, 3:25 PM                Assessment/Plan: Diagnosis: R posterior frontal stroke with R parietal cortex also affect with L hemiparesis, dysarthria, and dysphagia as well as balance/splace issues 1. Does the need for close, 24 hr/day medical supervision in concert with the patient's rehab needs make it unreasonable for this patient to be served in a less intensive setting? Yes 2. Co-Morbidities requiring supervision/potential complications: smoker 2ppd, COPD_ needs O2 currently, Hyperthyroid, HTN, CAD s/p hx of MI, BPH, anxiety, dperession 3. Due to bladder management, bowel management, safety, skin/wound care, disease management, medication administration, pain management and patient education, does the patient require 24 hr/day rehab nursing? Yes 4. Does the patient require coordinated care of a physician, rehab nurse, therapy disciplines of PT, OT and SLP to address physical and functional deficits in the context of the above medical diagnosis(es)? Yes Addressing deficits in the following areas: balance, endurance, locomotion, strength, transferring, bowel/bladder control, bathing, dressing, feeding, grooming, toileting, speech and swallowing 5. Can the patient actively participate  in an intensive therapy program of at least 3 hrs of therapy per day at least 5 days per week? Yes 6. The potential for patient to make measurable gains while on inpatient rehab is good and fair 7. Anticipated functional outcomes upon discharge from inpatient rehab are supervision and min assist  with PT, supervision and min assist with OT, supervision with SLP. 8. Estimated rehab length of stay to reach the above functional goals is: ~2-3 weeks- but needs assistance at home- concerned about dispo 9. Anticipated discharge destination: Home 10. Overall Rehab/Functional Prognosis: good and fair  RECOMMENDATIONS: This patient's condition is appropriate for continued rehabilitative care in the following setting: CIR and SNF Patient has agreed to participate in recommended program. Potentially Note that insurance prior authorization may be required for reimbursement for recommended care.  Comment:  1. Pt needs CIR, and would participate well and progress, but I'm concerned that sister/niece only family- no one lives in the area-  2. Will d/w admissions coordinators and see what's possible 3. Thank you for this consult   Cathlyn Parsons, PA-C 02/19/2020    I have personally performed a face to face diagnostic evaluation of this patient and formulated the key components of the plan.  Additionally, I have personally reviewed laboratory data, imaging studies, as well as relevant notes and concur with the physician assistant's documentation above.

## 2020-02-19 NOTE — Progress Notes (Signed)
Physical Therapy Treatment Patient Details Name: David Hendricks MRN: [id number] DOB: 1950-11-03 Today's Date: 02/19/2020    History of Present Illness Pt is a 70 y/o male admitted secondary to slurred speech and L sided weakness. 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 PMH includes tobacco use, CAD, COPD, anxiety, and depression.    PT Comments    Patient received in bed, much calmer and very cooperative today; speech still slurred and difficult to understand, but he was cooperative in slowing down and repeating himself as needed. Able to mobilize on a MinAx2 level in stedy, worked on multiple transfers in this device as well as standing lateral weight shifts however he does remain easily fatigued. SPO2 drop to 86% on room air with activity, continues to require supplemental O2 for mobility. Continues to require cues for safety, sequencing, and functional problem solving in dynamic and novel situations. Left up in recliner with all needs met and alarm active, nursing staff aware of patient status. Continue to recommend CIR.    Follow Up Recommendations  CIR;Supervision/Assistance - 24 hour     Equipment Recommendations  Wheelchair cushion (measurements PT);Wheelchair (measurements PT);Hospital bed;Other (comment) (hoyer lift)    Recommendations for Other Services       Precautions / Restrictions Precautions Precautions: Fall;Other (comment) Precaution Comments: can be agitated and combative- getting OOB and up to nurse station in recliner seems to help (claustrophobic), L weakness and lean Restrictions Weight Bearing Restrictions: No    Mobility  Bed Mobility Overal bed mobility: Needs Assistance Bed Mobility: Supine to Sit;Sit to Supine     Supine to sit: Min assist;HOB elevated     General bed mobility comments: MinA to get to EOB with HOB elevated, increased time and use of rail with cues to scoot forward to EOB    Transfers Overall transfer level: Needs  assistance Equipment used: Ambulation equipment used Transfers: Sit to/from Stand Sit to Stand: +2 physical assistance;Min assist         General transfer comment: MinAX2 to power up to standing in steady multiple times, only mild lean to the left today and able to weight shift laterally in stedy  Ambulation/Gait             General Gait Details: deferred- fatigue   Stairs             Wheelchair Mobility    Modified Rankin (Stroke Patients Only)       Balance Overall balance assessment: Needs assistance Sitting-balance support: Bilateral upper extremity supported;Feet supported Sitting balance-Leahy Scale: Poor Sitting balance - Comments: posterior and left lean Postural control: Posterior lean;Left lateral lean Standing balance support: Bilateral upper extremity supported;During functional activity Standing balance-Leahy Scale: Poor Standing balance comment: reliant on BUE support                            Cognition Arousal/Alertness: Awake/alert Behavior During Therapy: Flat affect Overall Cognitive Status: Impaired/Different from baseline Area of Impairment: Attention;Memory;Following commands;Safety/judgement;Awareness;Problem solving                   Current Attention Level: Sustained Memory: Decreased short-term memory;Decreased recall of precautions Following Commands: Follows one step commands consistently;Follows one step commands with increased time Safety/Judgement: Decreased awareness of safety;Decreased awareness of deficits Awareness: Intellectual Problem Solving: Slow processing;Decreased initiation;Difficulty sequencing;Requires verbal cues General Comments: much calmer today, followed cues well but with increased time. Still very flat affect and difficulty with sequencing. Still with  slurred speech which can be diffficult to understand at times.      Exercises      General Comments General comments (skin integrity,  edema, etc.): SPO2 drop to 86% on room air with actiivty, does still need 3LPM to maintain above 90% when active      Pertinent Vitals/Pain Pain Assessment: No/denies pain Faces Pain Scale: No hurt Pain Intervention(s): Limited activity within patient's tolerance;Monitored during session    Home Living                      Prior Function            PT Goals (current goals can now be found in the care plan section) Acute Rehab PT Goals Patient Stated Goal: none stated PT Goal Formulation: With patient Time For Goal Achievement: 03/01/20 Potential to Achieve Goals: Fair Progress towards PT goals: Progressing toward goals    Frequency    Min 3X/week      PT Plan Current plan remains appropriate    Co-evaluation              AM-PAC PT "6 Clicks" Mobility   Outcome Measure  Help needed turning from your back to your side while in a flat bed without using bedrails?: A Little Help needed moving from lying on your back to sitting on the side of a flat bed without using bedrails?: A Lot Help needed moving to and from a bed to a chair (including a wheelchair)?: A Lot Help needed standing up from a chair using your arms (e.g., wheelchair or bedside chair)?: A Little Help needed to walk in hospital room?: A Lot Help needed climbing 3-5 steps with a railing? : Total 6 Click Score: 13    End of Session Equipment Utilized During Treatment: Gait belt;Oxygen Activity Tolerance: Patient tolerated treatment well Patient left: in chair;with call bell/phone within reach;with chair alarm set Nurse Communication: Mobility status;Other (comment) (condom cath fell off during session) PT Visit Diagnosis: Unsteadiness on feet (R26.81);Muscle weakness (generalized) (M62.81);Difficulty in walking, not elsewhere classified (R26.2);Hemiplegia and hemiparesis Hemiplegia - Right/Left: Left Hemiplegia - caused by: Cerebral infarction     Time: 6438-3818 PT Time Calculation (min)  (ACUTE ONLY): 39 min  Charges:  $Therapeutic Activity: 38-52 mins                     Windell Norfolk, DPT, PN1   Supplemental Physical Therapist [place]    Pager [phone] Acute Rehab Office [phone]

## 2020-02-19 NOTE — Consult Note (Signed)
Consultation Note Date: 02/19/2020   Patient Name: David Hendricks  DOB: 1950-06-04  MRN: [id number]  Age / Sex: 70 y.o., male  PCP: David Pollen, MD Referring Physician: Geradine Girt, DO  Reason for Consultation: Establishing goals of care  HPI/Patient Profile: 70 y.o. male  with past medical history of COPD, CAD, HLD, hyperthyroidism, thyroid nodule, BPH, anxiety, depression, 2 pack a day smoker admitted on 02/15/2020 with slurred speech and left-sided weakness. Code stroke activated. MRI confirmed acute cortical and subcortical infarct high right posterior frontal lobe, possible additional punctate infarct with moderate to advanced microvascular ischemic disease. PT/OT/SLP following. Patient passed MBS and started on dysphagia diet. Palliative medicine consultation for goals of care.    Clinical Assessment and Goals of Care:  I have reviewed medical records, discussed with care team, and met with patient, brother David Hendricks) and sister-in-law David Hendricks) at bedside to discuss goals of care.   I introduced Palliative Medicine as specialized medical care for people living with serious illness. It focuses on providing relief from the symptoms and stress of a serious illness. The goal is to improve quality of life for both the patient and the family.  Prior to admission, patient living in an apartment independently. No spouse or children. All next of kin live out of state.   Discussed events leading up to admission and course of hospitalization including diagnoses, interventions, plan of care. Discussed neuro, PT/OT/SLP recommendations.   Yesterday, patient was resistant to the thought of rehab. With David Hendricks at bedside, patient is more willing to consider rehab following this hospitalization. (In the hallway, David Hendricks still expresses her concern with Tiquan ever being able to return home independently. I also expressed  the same concern).   I attempted to elicit values and goals of care important to the patient. Advanced directives, concepts specific to code status, artifical feeding and hydration were discussed. AD packed introduced and reviewed. Patient clearly verbalizes his wish for sister-in-law David Hendricks to serve as primary HCPOA and brother David Hendricks to serve as secondary HCPOA. Patient also verbalizes that he would not wish for life-prolonging measures if terminal or incurable. David Hendricks would like to complete this documentation this admission while they are in town. Reassured of support from chaplain with coordinating notary and witness tomorrow 2/11.  Introduced and discussed MOST form. Encouraged David Hendricks, and David Hendricks discuss and notify PMT provider this admission if they are ready to complete.    David Hendricks is requesting SW referral to discuss disposition plan and options. TOC notified. Ultimately, they may need to move [name] to [place] at some point so he is closer to family.   Questions and concerns were addressed.  Hard Choices booklet and PMT contact information given.     SUMMARY OF RECOMMENDATIONS    Continue full code/full scope treatment  AD packet reviewed. Patient wishes for SIL David Hendricks) to be primary HCPOA and brother David Hendricks) to be secondary HCPOA. Chaplain notified and will coordinate with notary tomorrow 2/11 to complete.  Introduced and discussed MOST form. Family  will notify PMT provider if ready to complete prior to patient discharge.   Continue PT/OT/SLP efforts. CIR vs. SNF rehab.  TOC referral per sister-in-law request.   Code Status/Advance Care Planning:  Full code  Symptom Management:   Per attending  Palliative Prophylaxis:   Aspiration, Bowel Regimen, Delirium Protocol, Oral Care and Turn Reposition  Additional Recommendations (Limitations, Scope, Preferences):  Full Scope Treatment  Psycho-social/Spiritual:   Desire for further Chaplaincy support:yes  Additional  Recommendations: Caregiving  Support/Resources  Prognosis:   Unable to determine  Discharge Planning: CIR vs. SNF rehab     Primary Diagnoses: Present on Admission: . CVA (cerebral vascular accident) ([place]) . CAD (coronary artery disease) . Cigarette nicotine dependence without complication . Hyperlipidemia . Pulmonary emphysema ([place]) . Subclinical hyperthyroidism . Tobacco abuse . Thyroid mass . Anxiety and depression . Aortic atherosclerosis ([place]) . BPH (benign prostatic hyperplasia) . Multinodular goiter . Obesity . Hyperbilirubinemia   I have reviewed the medical record, interviewed the patient and family, and examined the patient. The following aspects are pertinent.  Past Medical History:  Diagnosis Date  . Allergy   . Anxiety   . Aortic atherosclerosis ([place])   . Arthritis   . Benign prostatic hyperplasia (BPH) with urinary urgency   . Chronic kidney disease    KIDNEY STONES  . COPD (chronic obstructive pulmonary disease) ([place])    possible   . Coronary artery disease    10/18 PCI/DES to pLAD, mild nonobstructive disease in the Lcx/RCA. Normal EF.   Marland Kitchen Depression   . Dyslipidemia   . Dysrhythmia 2016   irregular heartbeat  . Gallstones   . GERD (gastroesophageal reflux disease)   . History of kidney stones   . Hyperlipidemia   . Hyperthyroidism   . Leg cramps   . Multiple pulmonary nodules   . Myocardial infarction ([place])    2018  . Pneumonia   . Seasonal allergies   . Thyroid nodule   . Vertigo   . Vitamin B 12 deficiency    Social History   Socioeconomic History  . Marital status: Divorced    Spouse name: Not on file  . Number of children: 0  . Years of education: Not on file  . Highest education level: Not on file  Occupational History  . Occupation: retired Mudlogger  Tobacco Use  . Smoking status: Current Every Day Smoker    Packs/day: 2.00    Years: 45.00    Pack years: 90.00    Types: Cigarettes    Start date: 01/09/1961  .  Smokeless tobacco: Never Used  . Tobacco comment: tobacco info given  Vaping Use  . Vaping Use: Never used  Substance and Sexual Activity  . Alcohol use: No  . Drug use: No  . Sexual activity: Not on file  Other Topics Concern  . Not on file  Social History Narrative  . Not on file   Social Determinants of Health   Financial Resource Strain: Not on file  Food Insecurity: Not on file  Transportation Needs: Not on file  Physical Activity: Not on file  Stress: Not on file  Social Connections: Not on file   Family History  Problem Relation Age of Onset  . Alcohol abuse Father        called [place] per pt  . Cancer Father        OAK CELL CANCER PER PT NON SURGICAL   . Colon cancer Neg Hx   . Colon  polyps Neg Hx   . Esophageal cancer Neg Hx   . Rectal cancer Neg Hx   . Stomach cancer Neg Hx    Scheduled Meds: .  stroke: mapping our early stages of recovery book   Does not apply Once  . aspirin EC  81 mg Oral Daily  . atorvastatin  80 mg Oral Daily  . chlorhexidine  15 mL Mouth Rinse BID  . clopidogrel  75 mg Oral Daily  . mouth rinse  15 mL Mouth Rinse q12n4p  . methimazole  5 mg Oral Daily  . nicotine  21 mg Transdermal Daily  . pantoprazole  40 mg Oral BID  . tamsulosin  0.4 mg Oral Daily  . traZODone  50 mg Oral QHS   Continuous Infusions: PRN Meds:.acetaminophen **OR** acetaminophen (TYLENOL) oral liquid 160 mg/5 mL **OR** acetaminophen, albuterol, bisacodyl, LORazepam, ondansetron (ZOFRAN) IV, Resource ThickenUp Clear, senna-docusate Medications Prior to Admission:  Prior to Admission medications   Medication Sig Start Date End Date Taking? Authorizing Provider  albuterol (VENTOLIN HFA) 108 (90 Base) MCG/ACT inhaler INHALE 2 PUFFS INTO THE LUNGS EVERY 6 HOURS AS NEEDED Patient taking differently: Inhale 2 puffs into the lungs every 6 (six) hours as needed for wheezing or shortness of breath. 10/30/19  Yes Spero Geralds, MD  aspirin EC 81 MG tablet Take  81 mg by mouth daily.   Yes [provider]  atorvastatin (LIPITOR) 80 MG tablet Take 1 tablet (80 mg total) by mouth daily. 05/08/19  Yes [name], MD  cyclobenzaprine (FLEXERIL) 5 MG tablet Take 5 mg by mouth 3 (three) times daily as needed for muscle spasms. 01/26/20  Yes [provider]  gabapentin (NEURONTIN) 100 MG capsule Take 100 mg by mouth 3 (three) times daily as needed (pain). 02/01/20  Yes [provider]  methimazole (TAPAZOLE) 5 MG tablet TAKE 1 TABLET(5 MG) BY MOUTH DAILY Patient taking differently: Take 5 mg by mouth daily. 09/12/19  Yes Shamleffer, Melanie Crazier, MD  naproxen sodium (ALEVE) 220 MG tablet Take 220 mg by mouth daily as needed (mild pain).   Yes [provider]  ondansetron (ZOFRAN) 4 MG tablet Take 1 tablet (4 mg total) by mouth every 6 (six) hours as needed for nausea or vomiting. 06/12/19  Yes Pollina, Gwenyth Allegra, MD  OVER THE COUNTER MEDICATION Take 1 tablet by mouth daily as needed (nasal congestion). [employer] brand   Yes [provider]  oxyCODONE-acetaminophen (PERCOCET) 5-325 MG tablet Take 1 tablet by mouth every 4 (four) hours as needed. Patient taking differently: Take 1-1.5 tablets by mouth every 4 (four) hours as needed for moderate pain. 06/12/19  Yes Pollina, Gwenyth Allegra, MD  pantoprazole (PROTONIX) 40 MG tablet TAKE 1 TABLET BY MOUTH EVERY DAY Patient taking differently: Take 40 mg by mouth daily as needed (heartburn). 05/21/19  Yes Libby Maw, MD  tamsulosin (FLOMAX) 0.4 MG CAPS capsule Take 0.4 mg by mouth daily.    Yes [provider]  traMADol (ULTRAM) 50 MG tablet Take 50 mg by mouth every 6 (six) hours as needed for moderate pain. 09/29/19  Yes [provider]  traZODone (DESYREL) 50 MG tablet Take 50 mg by mouth at bedtime. 08/19/19  Yes [provider]  budesonide-formoterol (SYMBICORT) 80-4.5 MCG/ACT inhaler Inhale 2 puffs into the lungs 2 (two)  times daily. Patient not taking: Reported on 02/15/2020 06/02/19   David Pollen, MD   No Known Allergies Review of Systems  Unable to  perform ROS  Physical Exam Vitals and nursing note reviewed.  HENT:     Head: Normocephalic and atraumatic.  Pulmonary:     Effort: No tachypnea, accessory muscle usage or respiratory distress.  Skin:    General: Skin is warm and dry.  Neurological:     Mental Status: He is easily aroused.     Comments: Dysarthria, generalized weakness.      Vital Signs: BP 140/69 (BP Location: Right Arm)   Pulse 80   Temp 97.8 F (36.6 C) (Oral)   Resp 18   Ht '6\' 1"'  (1.854 m)   Wt 99.8 kg   SpO2 93%   BMI 29.03 kg/m  Pain Scale: 0-10   Pain Score: 0-No pain   SpO2: SpO2: 93 % O2 Device:SpO2: 93 % O2 Flow Rate: .O2 Flow Rate (L/min): 2 L/min  IO: Intake/output summary:   Intake/Output Summary (Last 24 hours) at 02/19/2020 1149 Last data filed at 02/19/2020 0119 Gross per 24 hour  Intake --  Output 900 ml  Net -900 ml    LBM: Last BM Date: 02/17/20 Baseline Weight: Weight: 99.8 kg Most recent weight: Weight: 99.8 kg     Palliative Assessment/Data: PPS 50%    Time Total: 70 Greater than 50%  of this time was spent counseling and coordinating care related to the above assessment and plan.  Signed by:  Ihor Dow, DNP, FNP-C Palliative Medicine Team  Phone: [phone] Fax: [phone]   Please contact Palliative Medicine Team phone at [phone] for questions and concerns.  For individual provider: See Shea Evans

## 2020-02-19 NOTE — Progress Notes (Signed)
Progress Note    [name]  ZOX:[id number] DOB: 23-Jan-1950  DOA: 02/15/2020 PCP: Horald Pollen, MD    Brief Narrative:     Medical records reviewed and are as summarized below:  David Hendricks is an 70 y.o. male with a PMH oftobacco abuse 2 pack a day, COPD, CAD, HLD, hyperthyroidism, thyroid nodule, BPH, anxiety and depression, who presented to the ED via EMS with slurred speech and left-sided weakness. CODE STROKE activated. In the ED, evaluated by neurologist who noted dysarthria and a left facial droop. 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  Assessment/Plan:   Principal Problem:   CVA (cerebral vascular accident) ([place]) Active Problems:   Tobacco abuse   History of non-ST elevation myocardial infarction (NSTEMI)   Hyperlipidemia   Old MI (myocardial infarction)   CAD (coronary artery disease)   Cigarette nicotine dependence without complication   BPH (benign prostatic hyperplasia)   Multinodular goiter   Subclinical hyperthyroidism   Pulmonary emphysema (HCC)   Aortic atherosclerosis (HCC)   Thyroid mass   Anxiety and depression   Obesity   Hyperbilirubinemia   Dysphagia following cerebrovascular accident   Dysarthria due to recent cerebral infarction    Acute CVA (cerebrovascular accident) with moderate dysarthria, left facial weakness and dysphagia POA -telemetry.  -tPA not given because patient outside of normal window. -CT head showed no evidence of acute large vascular territory infarct. 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 CTA of head/neck showed nondominant right  vertebral artery is diminutive throughout its course and likely occluded intradurally.  aspirin 81 mg daily and clopidogrel 75 mg daily. Continue DAPT for 3 weeks and then plavix alone -FLP showed a cholesterol of 168, LDL 111. Continue statin.   -HgbA1c 5.2%.  -2 D Echocardiogram showed EF 81-19%, grade I diastolic dysfunction and mild LVH.  -PT recommending SNF.  Mental status (re: agitation) seems to continue to improve but continues with poor insight and difficulty following commands  -SLP following: DYS diet -palliative consult placed    Agitation -appears resolved -calm and cooperative thus far today    Hyperbilirubinemia Mild, unclear etiology. Outpatient work up recommended.    Tobacco abuse, POA RN to provide tobacco cessation counseling. Continue nicoderm patch 21 mg (smokes 2 PPD).    Hyperlipidemia, POA FLP showed a cholesterol of 168, LDL 111. Continue statin.    CAD (coronary artery disease) with h/o MI and aortic atherosclerosis, POA Continue ASA/statin.    Subclinical hyperthyroidism/thyroid mass/multinodular goiter, POA Continue methimazole.    Pulmonary emphysema (HCC) Continue PRN albuterol. Not taking symbicort anymore.    Anxiety and depression Continue Desyrel QHS.    BPH, POA Continue Flomax.   Progression: Sister says he has no close family that lives near-by and she lives in [place]. He has no children. Palliative care consulted to assist with goals of care and disposition. Sister confirms he will likely refuse SNF   Family Communication/Anticipated D/C date and plan/Code Status   DVT prophylaxis: Lovenox ordered. Code Status: Full Code.  Disposition Plan: Status is: Inpatient  Remains inpatient appropriate because:Inpatient level of care appropriate due to severity of illness   Dispo: The patient is from: Home  Anticipated d/c is to: tbd              Anticipated d/c date is: 2 days              Patient currently is not  medically stable to d/c.   Difficult to place patient No         Medical Consultants:    Neurology  Palliative care  Subjective:   Working with SLP and excited about his progress swallowing Does c/o GERD like symptoms  Objective:    Vitals:   02/18/20 1214 02/18/20 1812 02/19/20 0117 02/19/20 0632  BP: 100/77 (!) 144/49 131/73 140/69  Pulse: 81 95 89 80  Resp: 20 16 18 18   Temp: 98.5 F (36.9 C) 98 F (36.7 C) 97.7 F (36.5 C) 97.8 F (36.6 C)  TempSrc: Oral Oral Oral Oral  SpO2: 91% 93% 92% 93%  Weight:      Height:        Intake/Output Summary (Last 24 hours) at 02/19/2020 1051 Last data filed at 02/19/2020 0119 Gross per 24 hour  Intake -  Output 900 ml  Net -900 ml   Filed Weights   02/15/20 1355  Weight: 99.8 kg    Exam:  General: Appearance:     Overweight male in no acute distress     Lungs:     respirations unlabored  Heart:    Normal heart rate.   MS:   All extremities are intact.   Neurologic:   Awake, alert, cooperative, speech slurred     Data Reviewed:   I have personally reviewed following labs and imaging studies:  Labs: Labs show the following:   Basic Metabolic Panel: Recent Labs  Lab 02/15/20 1325 02/15/20 1333 02/16/20 0500 02/19/20 0218  NA 139 139 137 137  K 4.4 3.9 3.7 3.6  CL 102 102 101 102  CO2 25  --  24 23  GLUCOSE 104* 104* 92 83  BUN 9 10 10 11   CREATININE 1.00 0.90 0.98 0.85  CALCIUM 9.6  --  8.8* 8.4*   GFR Estimated Creatinine Clearance: 100.5 mL/min (by C-G formula based on SCr of 0.85 mg/dL). Liver Function Tests: Recent Labs  Lab 02/15/20 1325 02/16/20 0500  AST 33 24  ALT 18 17  ALKPHOS 106 95  BILITOT 1.5* 1.5*  PROT 6.7 6.3*  ALBUMIN 3.6 3.5   No results for input(s): LIPASE, AMYLASE in the last 168 hours. No results for input(s): AMMONIA in the last 168 hours. Coagulation profile Recent Labs  Lab 02/15/20 1325  INR 0.9    CBC: Recent Labs  Lab 02/15/20 1325  02/15/20 1333 02/16/20 0500 02/19/20 0218  WBC 9.7  --  13.9* 9.8  NEUTROABS 6.3  --   --   --   HGB 18.3* 18.4* 16.9 16.4  HCT 55.2* 54.0* 48.6 49.3  MCV 86.4  --  85.7 86.5  PLT 192  --  170 160   Cardiac Enzymes: Recent Labs  Lab 02/15/20 1815  CKTOTAL 62   BNP (last 3 results) No results for input(s): PROBNP in the last 8760 hours. CBG: Recent Labs  Lab 02/15/20 1408  GLUCAP 96   D-Dimer: No results for input(s): DDIMER in the last 72 hours. Hgb A1c: No results for input(s): HGBA1C in the last 72 hours. Lipid Profile: No results for input(s): CHOL, HDL, LDLCALC, TRIG, CHOLHDL, LDLDIRECT in the last 72 hours. Thyroid function studies: No results for input(s): TSH, T4TOTAL, T3FREE, THYROIDAB in  the last 72 hours.  Invalid input(s): FREET3 Anemia work up: No results for input(s): VITAMINB12, FOLATE, FERRITIN, TIBC, IRON, RETICCTPCT in the last 72 hours. Sepsis Labs: Recent Labs  Lab 02/15/20 1325 02/15/20 1815 02/16/20 0500 02/19/20 0218  WBC 9.7  --  13.9* 9.8  LATICACIDVEN  --  1.0  --   --     Microbiology Recent Results (from the past 240 hour(s))  SARS CORONAVIRUS 2 (TAT 6-24 HRS) Nasopharyngeal Nasopharyngeal Swab     Status: None   Collection Time: 02/15/20  2:26 PM   Specimen: Nasopharyngeal Swab  Result Value Ref Range Status   SARS Coronavirus 2 NEGATIVE NEGATIVE Final    Comment: (NOTE) SARS-CoV-2 target nucleic acids are NOT DETECTED.  The SARS-CoV-2 RNA is generally detectable in upper and lower respiratory specimens during the acute phase of infection. Negative results do not preclude SARS-CoV-2 infection, do not rule out co-infections with other pathogens, and should not be used as the sole basis for treatment or other patient management decisions. Negative results must be combined with clinical observations, patient history, and epidemiological information. The expected result is Negative.  Fact Sheet for  Patients: SugarRoll.be  Fact Sheet for Healthcare Providers: https://www.woods-mathews.com/  This test is not yet approved or cleared by the [place] FDA and  has been authorized for detection and/or diagnosis of SARS-CoV-2 by FDA under an Emergency Use Authorization (EUA). This EUA will remain  in effect (meaning this test can be used) for the duration of the COVID-19 declaration under Se ction 564(b)(1) of the Act, 21 U.S.C. section 360bbb-3(b)(1), unless the authorization is terminated or revoked sooner.  Performed at [facility], [place] [address], [place], [place] [address]     Procedures and diagnostic studies:  DG Swallowing Func-Speech Pathology  Result Date: 02/18/2020 Objective Swallowing Evaluation: Type of Study: MBS-Modified Barium Swallow Study  Patient Details Name: David Hendricks MRN: [id number] Date of Birth: 1950/11/07 Today's Date: 02/18/2020 Time: SLP Start Time (ACUTE ONLY): 1409 -SLP Stop Time (ACUTE ONLY): 1427 SLP Time Calculation (min) (ACUTE ONLY): 18 min Past Medical History: Past Medical History: Diagnosis Date . Allergy  . Anxiety  . Aortic atherosclerosis ([place])  . Arthritis  . Benign prostatic hyperplasia (BPH) with urinary urgency  . Chronic kidney disease   KIDNEY STONES . COPD (chronic obstructive pulmonary disease) ([place])   possible  . Coronary artery disease   10/18 PCI/DES to pLAD, mild nonobstructive disease in the Lcx/RCA. Normal EF.  Marland Kitchen Depression  . Dyslipidemia  . Dysrhythmia 2016  irregular heartbeat . Gallstones  . GERD (gastroesophageal reflux disease)  . History of kidney stones  . Hyperlipidemia  . Hyperthyroidism  . Leg cramps  . Multiple pulmonary nodules  . Myocardial infarction ([place])   2018 . Pneumonia  . Seasonal allergies  . Thyroid nodule  . Vertigo  . Vitamin B 12 deficiency  Past Surgical History: Past Surgical History: Procedure Laterality Date . COLONOSCOPY   . CORONARY ANGIOPLASTY  10/09/2016 .  CORONARY STENT INTERVENTION N/A 10/09/2016  Procedure: CORONARY STENT INTERVENTION;  Surgeon: Nelva Bush, MD;  Location: [facility];  Service: Cardiovascular;  Laterality: N/A; . CYSTOSCOPY WITH RETROGRADE PYELOGRAM, URETEROSCOPY AND STENT PLACEMENT Left 06/17/2019  Procedure: CYSTOSCOPY WITH RETROGRADE PYELOGRAM, URETEROSCOPY AND STENT PLACEMENT;  Surgeon: Robley Fries, MD;  Location: [facility];  Service: Urology;  Laterality: Left;  1 HR . HOLMIUM LASER APPLICATION Left 08/10/9560  Procedure: HOLMIUM LASER APPLICATION;  Surgeon: Robley Fries, MD;  Location: [facility];  Service: Urology;  Laterality: Left; . INTRAVASCULAR ULTRASOUND/IVUS N/A 10/09/2016  Procedure: Intravascular Ultrasound/IVUS;  Surgeon: Nelva Bush, MD;  Location: [facility];  Service: Cardiovascular;  Laterality: N/A; . IR CHOLANGIOGRAM EXISTING TUBE  11/18/2018 . IR PERC CHOLECYSTOSTOMY  11/16/2018 . LEFT HEART CATH AND CORONARY ANGIOGRAPHY N/A 10/09/2016  Procedure: LEFT HEART CATH AND CORONARY ANGIOGRAPHY;  Surgeon: Nelva Bush, MD;  Location: [facility];  Service: Cardiovascular;  Laterality: N/A; HPI: Pt is a 70 y/o male admitted secondary to slurred speech and L sided weakness. 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 PMH includes tobacco use, CAD, COPD, anxiety, and depression.  Subjective: alert, cooperative, dysarthric Assessment / Plan / Recommendation CHL IP CLINICAL IMPRESSIONS 02/18/2020 Clinical Impression Although pt presents with similar areas of weakness compared to yesterday's MBS, his improved mentation has a positive impact on his performance and ability to incorporate swallowing strategies. Head turns do not have a significant effect, but he has mildly improved vallecular clearance with use of a chin tuck and hard swallows. Residue is still significant, but he demonstrated some awareness when he spontaneously brought most of the residue back into his mouth to swallow it again, clearing  more of it on the second try. On subsequent swallows, pt could be cued to do the same. Penetration and trace aspiration occurs with nectar thick liquids that still spill into the airway before he swallows, but no aspiration is observed with honey thick liquids or purees. There is trace amounts of penetration that is not always fully cleared, and when also considering the amount of vallecular residue he has, he would still remain at risk for some aspiration across the duration of a meal. Discussed with pt and MD, who is in agreement with starting modified diet with use of precautions to reduce but not eliminate aspiration risk while palliative care consult is pending. Therefore, will start Dys 1 diet and honey thick liquids with the following  precautions: chin tuck, alternate solids/liquids, hard swallows, cue to bring food back into his mouth and swallow again.  SLP Visit Diagnosis Dysphagia, oropharyngeal phase (R13.12) Attention and concentration deficit following -- Frontal lobe and executive function deficit following -- Impact on safety and function Moderate aspiration risk   CHL IP TREATMENT RECOMMENDATION 02/18/2020 Treatment Recommendations Therapy as outlined in treatment plan below   Prognosis 02/18/2020 Prognosis for Safe Diet Advancement Good Barriers to Reach Goals Cognitive deficits;Severity of deficits Barriers/Prognosis Comment -- CHL IP DIET RECOMMENDATION 02/18/2020 SLP Diet Recommendations Dysphagia 1 (Puree) solids;Honey thick liquids Liquid Administration via Cup Medication Administration Crushed with puree Compensations Minimize environmental distractions;Slow rate;Follow solids with liquid;Chin tuck;Other (Comment) Postural Changes Seated upright at 90 degrees;Remain semi-upright after after feeds/meals (Comment)   CHL IP OTHER RECOMMENDATIONS 02/18/2020 Recommended Consults -- Oral Care Recommendations Oral care QID Other Recommendations Have oral suction available;Order thickener from  pharmacy;Prohibited food (jello, ice cream, thin soups);Remove water pitcher   CHL IP FOLLOW UP RECOMMENDATIONS 02/18/2020 Follow up Recommendations Inpatient Rehab   CHL IP FREQUENCY AND DURATION 02/18/2020 Speech Therapy Frequency (ACUTE ONLY) min 2x/week Treatment Duration 2 weeks      CHL IP ORAL PHASE 02/18/2020 Oral Phase Impaired Oral - Pudding Teaspoon -- Oral - Pudding Cup -- Oral - Honey Teaspoon -- Oral - Honey Cup Reduced posterior propulsion;Decreased bolus cohesion;Lingual/palatal residue;Weak lingual manipulation Oral - Nectar Teaspoon Reduced posterior propulsion;Decreased bolus cohesion;Lingual/palatal residue;Weak lingual manipulation Oral - Nectar Cup -- Oral - Nectar Straw -- Oral -  Thin Teaspoon -- Oral - Thin Cup -- Oral - Thin Straw -- Oral - Puree Reduced posterior propulsion;Decreased bolus cohesion;Lingual/palatal residue;Weak lingual manipulation Oral - Mech Soft -- Oral - Regular -- Oral - Multi-Consistency -- Oral - Pill -- Oral Phase - Comment --  CHL IP PHARYNGEAL PHASE 02/18/2020 Pharyngeal Phase Impaired Pharyngeal- Pudding Teaspoon -- Pharyngeal -- Pharyngeal- Pudding Cup -- Pharyngeal -- Pharyngeal- Honey Teaspoon -- Pharyngeal -- Pharyngeal- Honey Cup Reduced pharyngeal peristalsis;Reduced epiglottic inversion;Reduced anterior laryngeal mobility;Reduced laryngeal elevation;Reduced tongue base retraction;Pharyngeal residue - valleculae;Penetration/Aspiration before swallow Pharyngeal Material enters airway, remains ABOVE vocal cords and not ejected out Pharyngeal- Nectar Teaspoon Reduced pharyngeal peristalsis;Reduced epiglottic inversion;Reduced anterior laryngeal mobility;Reduced laryngeal elevation;Reduced tongue base retraction;Pharyngeal residue - valleculae;Penetration/Aspiration before swallow Pharyngeal Material enters airway, passes BELOW cords without attempt by patient to eject out (silent aspiration) Pharyngeal- Nectar Cup -- Pharyngeal -- Pharyngeal- Nectar Straw -- Pharyngeal  -- Pharyngeal- Thin Teaspoon -- Pharyngeal -- Pharyngeal- Thin Cup -- Pharyngeal -- Pharyngeal- Thin Straw -- Pharyngeal -- Pharyngeal- Puree Reduced pharyngeal peristalsis;Reduced epiglottic inversion;Reduced anterior laryngeal mobility;Reduced laryngeal elevation;Reduced tongue base retraction;Pharyngeal residue - valleculae;Penetration/Apiration after swallow Pharyngeal Material enters airway, remains ABOVE vocal cords then ejected out Pharyngeal- Mechanical Soft -- Pharyngeal -- Pharyngeal- Regular -- Pharyngeal -- Pharyngeal- Multi-consistency -- Pharyngeal -- Pharyngeal- Pill -- Pharyngeal -- Pharyngeal Comment --  CHL IP CERVICAL ESOPHAGEAL PHASE 02/18/2020 Cervical Esophageal Phase WFL Pudding Teaspoon -- Pudding Cup -- Honey Teaspoon -- Honey Cup -- Nectar Teaspoon -- Nectar Cup -- Nectar Straw -- Thin Teaspoon -- Thin Cup -- Thin Straw -- Puree -- Mechanical Soft -- Regular -- Multi-consistency -- Pill -- Cervical Esophageal Comment -- Osie Bond., M.A. CCC-SLP Acute Rehabilitation Services Pager [phone] Office [phone] 02/18/2020, 4:35 PM              DG Swallowing Func-Speech Pathology  Result Date: 02/17/2020 Objective Swallowing Evaluation: Type of Study: MBS-Modified Barium Swallow Study  Patient Details Name: Vincient Vanaman MRN: [id number] Date of Birth: 1950/01/16 Today's Date: 02/17/2020 Time: SLP Start Time (ACUTE ONLY): 1335 -SLP Stop Time (ACUTE ONLY): 1353 SLP Time Calculation (min) (ACUTE ONLY): 18 min Past Medical History: Past Medical History: Diagnosis Date . Allergy  . Anxiety  . Aortic atherosclerosis ([place])  . Arthritis  . Benign prostatic hyperplasia (BPH) with urinary urgency  . Chronic kidney disease   KIDNEY STONES . COPD (chronic obstructive pulmonary disease) ([place])   possible  . Coronary artery disease   10/18 PCI/DES to pLAD, mild nonobstructive disease in the Lcx/RCA. Normal EF.  Marland Kitchen Depression  . Dyslipidemia  . Dysrhythmia 2016  irregular heartbeat . Gallstones  . GERD  (gastroesophageal reflux disease)  . History of kidney stones  . Hyperlipidemia  . Hyperthyroidism  . Leg cramps  . Multiple pulmonary nodules  . Myocardial infarction ([place])   2018 . Pneumonia  . Seasonal allergies  . Thyroid nodule  . Vertigo  . Vitamin B 12 deficiency  Past Surgical History: Past Surgical History: Procedure Laterality Date . COLONOSCOPY   . CORONARY ANGIOPLASTY  10/09/2016 . CORONARY STENT INTERVENTION N/A 10/09/2016  Procedure: CORONARY STENT INTERVENTION;  Surgeon: Nelva Bush, MD;  Location: [facility];  Service: Cardiovascular;  Laterality: N/A; . CYSTOSCOPY WITH RETROGRADE PYELOGRAM, URETEROSCOPY AND STENT PLACEMENT Left 06/17/2019  Procedure: CYSTOSCOPY WITH RETROGRADE PYELOGRAM, URETEROSCOPY AND STENT PLACEMENT;  Surgeon: Robley Fries, MD;  [facility];  Service: Urology;  Laterality: Left;  1 HR . HOLMIUM LASER APPLICATION Left 02/10/2977  Procedure: HOLMIUM  LASER APPLICATION;  Surgeon: Robley Fries, MD;  [facility];  Service: Urology;  Laterality: Left; . INTRAVASCULAR ULTRASOUND/IVUS N/A 10/09/2016  Procedure: Intravascular Ultrasound/IVUS;  Surgeon: Nelva Bush, MD;  Location: [facility];  Service: Cardiovascular;  Laterality: N/A; . IR CHOLANGIOGRAM EXISTING TUBE  11/18/2018 . IR PERC CHOLECYSTOSTOMY  11/16/2018 . LEFT HEART CATH AND CORONARY ANGIOGRAPHY N/A 10/09/2016  Procedure: LEFT HEART CATH AND CORONARY ANGIOGRAPHY;  Surgeon: Nelva Bush, MD;  Location: [facility];  Service: Cardiovascular;  Laterality: N/A; HPI: Pt is a 70 y/o male admitted secondary to slurred speech and L sided weakness. 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 PMH includes tobacco use, CAD, COPD, anxiety, and depression.  Subjective: pt lethargic but following commands Assessment / Plan / Recommendation CHL IP CLINICAL IMPRESSIONS 02/17/2020 Clinical Impression Pt presents with a significant oropharyngeal dysphagia that is also exacerbated by current  level of alertness. He is quite lethargic throughout testing, although alert enough to follow commands and accept boluses consistently. Limited trials were given in light of severity of dysphagia and mentation. He has limited lingual control of boluses, allowing a spoonful of nectar thick liquids to spill immediately back into his laryngeal vestibule. Once it is there, he tries to achieve some laryngeal closure, which is successful at expelling some of the bolus back out of his airway, but he does aspirate some of it. Pt also has very limited base of tongue retraction, hyolaryngeal movement, and pharyngeal squeeze, so that almost an entire bolus of puree remains in his valleculae post-swallow. He cannot clear this on his own despite cues, with SLP also providing suction via yankauer to clear as much as possible. As this pureed residue seems to thin out by mixing with secretions, he begins to aspirate this as well. Recommend that pt remain NPO for now with consideration of temporary, alternative means of nutrition with plans to retest swallowing function when more alert. MD in agreement with keeping pt NPO and also plans to involve palliative care.  SLP Visit Diagnosis Dysphagia, oropharyngeal phase (R13.12) Attention and concentration deficit following -- Frontal lobe and executive function deficit following -- Impact on safety and function Severe aspiration risk   CHL IP TREATMENT RECOMMENDATION 02/17/2020 Treatment Recommendations Therapy as outlined in treatment plan below   Prognosis 02/17/2020 Prognosis for Safe Diet Advancement Fair Barriers to Reach Goals Cognitive deficits;Severity of deficits Barriers/Prognosis Comment -- CHL IP DIET RECOMMENDATION 02/17/2020 SLP Diet Recommendations NPO;Alternative means - temporary Liquid Administration via -- Medication Administration Via alternative means Compensations -- Postural Changes --   CHL IP OTHER RECOMMENDATIONS 02/17/2020 Recommended Consults -- Oral Care  Recommendations Oral care QID Other Recommendations Have oral suction available   CHL IP FOLLOW UP RECOMMENDATIONS 02/17/2020 Follow up Recommendations Inpatient Rehab   CHL IP FREQUENCY AND DURATION 02/17/2020 Speech Therapy Frequency (ACUTE ONLY) min 2x/week Treatment Duration 2 weeks      CHL IP ORAL PHASE 02/17/2020 Oral Phase Impaired Oral - Pudding Teaspoon -- Oral - Pudding Cup -- Oral - Honey Teaspoon -- Oral - Honey Cup -- Oral - Nectar Teaspoon Decreased bolus cohesion;Weak lingual manipulation Oral - Nectar Cup -- Oral - Nectar Straw -- Oral - Thin Teaspoon -- Oral - Thin Cup -- Oral - Thin Straw -- Oral - Puree Lingual/palatal residue;Weak lingual manipulation Oral - Mech Soft -- Oral - Regular -- Oral - Multi-Consistency -- Oral - Pill -- Oral Phase - Comment --  CHL IP PHARYNGEAL PHASE 02/17/2020 Pharyngeal Phase  Impaired Pharyngeal- Pudding Teaspoon -- Pharyngeal -- Pharyngeal- Pudding Cup -- Pharyngeal -- Pharyngeal- Honey Teaspoon -- Pharyngeal -- Pharyngeal- Honey Cup -- Pharyngeal -- Pharyngeal- Nectar Teaspoon Reduced pharyngeal peristalsis;Reduced epiglottic inversion;Reduced anterior laryngeal mobility;Reduced laryngeal elevation;Reduced tongue base retraction;Pharyngeal residue - valleculae;Penetration/Aspiration before swallow Pharyngeal Material enters airway, passes BELOW cords without attempt by patient to eject out (silent aspiration) Pharyngeal- Nectar Cup -- Pharyngeal -- Pharyngeal- Nectar Straw -- Pharyngeal -- Pharyngeal- Thin Teaspoon -- Pharyngeal -- Pharyngeal- Thin Cup -- Pharyngeal -- Pharyngeal- Thin Straw -- Pharyngeal -- Pharyngeal- Puree Reduced pharyngeal peristalsis;Reduced epiglottic inversion;Reduced anterior laryngeal mobility;Reduced laryngeal elevation;Reduced tongue base retraction;Pharyngeal residue - valleculae;Penetration/Apiration after swallow Pharyngeal Material enters airway, passes BELOW cords without attempt by patient to eject out (silent aspiration) Pharyngeal-  Mechanical Soft -- Pharyngeal -- Pharyngeal- Regular -- Pharyngeal -- Pharyngeal- Multi-consistency -- Pharyngeal -- Pharyngeal- Pill -- Pharyngeal -- Pharyngeal Comment --  CHL IP CERVICAL ESOPHAGEAL PHASE 02/17/2020 Cervical Esophageal Phase (No Data) Pudding Teaspoon -- Pudding Cup -- Honey Teaspoon -- Honey Cup -- Nectar Teaspoon -- Nectar Cup -- Nectar Straw -- Thin Teaspoon -- Thin Cup -- Thin Straw -- Puree -- Mechanical Soft -- Regular -- Multi-consistency -- Pill -- Cervical Esophageal Comment -- Osie Bond., M.A. [facility] Acute Rehabilitation Services Pager [phone] Office [phone] 02/17/2020, 3:25 PM               Medications:   .  stroke: mapping our early stages of recovery book   Does not apply Once  . aspirin EC  81 mg Oral Daily   Or  . aspirin  300 mg Rectal Daily  . atorvastatin  80 mg Oral Daily  . chlorhexidine  15 mL Mouth Rinse BID  . clopidogrel  75 mg Oral Daily  . mouth rinse  15 mL Mouth Rinse q12n4p  . methimazole  5 mg Oral Daily  . nicotine  21 mg Transdermal Daily  . pantoprazole  40 mg Oral Daily  . tamsulosin  0.4 mg Oral Daily  . traZODone  50 mg Oral QHS   Continuous Infusions:    LOS: 4 days   Geradine Girt  [facility]   How to contact the [facility] Attending or Consulting provider [place] or covering provider during after hours [place], for this patient?  1. Check the care team in [facility] and look for a) attending/consulting TRH provider listed and b) the [facility] team listed 2. Log into [email or web address] and use [place]'s universal password to access. If you do not have the password, please contact the hospital operator. 3. Locate the [facility] provider you are looking for under [facility] and page to a number that you can be directly reached. 4. If you still have difficulty reaching the provider, please page the [facility] (Director on Call) for the Hospitalists listed on amion for assistance.  02/19/2020, 10:51 AM

## 2020-02-19 NOTE — Progress Notes (Signed)
  Speech Language Pathology Treatment: Dysphagia;Cognitive-Linquistic  Patient Details Name: David Hendricks MRN: [id number] DOB: October 13, 1950 Today's Date: 02/19/2020 Time: 7893-8101 SLP Time Calculation (min) (ACUTE ONLY): 24 min  Assessment / Plan / Recommendation Clinical Impression  Pt needed Mod cues for recall of swallowing study on previous date, including events as well as results. SLP provided education and training in use of swallowing strategies prior to providing PO trials, and also gave assistance with awareness and functional problem solving in order to get into a better position for PO intake. He had only one cough throughout all trials, and responded well to cueing throughout intake to increase use of strategies. In particular, he would need reminders to keep his chin in a fully tucked position until after he finished swallowing. Pt started acknowledging during session today that his speech is not the same as it normally is, and although he said he wanted to do therapy to improve it, he also wants to go home to be with his dog. SLP provided education about recommendations and cues for anticipatory awareness when discussing potential discharge plans. Continue to recommend intensive SLP f/u and 24/7 supervision. Will leave current diet and precautions in place.    HPI HPI: Pt is a 70 y/o male admitted secondary to slurred speech and L sided weakness. 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 PMH includes tobacco use, CAD, COPD, anxiety, and depression.      SLP Plan  Continue with current plan of care       Recommendations  Diet recommendations: Dysphagia 1 (puree);Honey-thick liquid Liquids provided via: Cup;No straw Medication Administration: Crushed with puree Compensations: Minimize environmental distractions;Slow rate;Follow solids with liquid;Chin tuck;Other (Comment) (cue pt to bring food back into his mouth and reswallow) Postural Changes and/or Swallow Maneuvers: Seated  upright 90 degrees;Upright 30-60 min after meal                Oral Care Recommendations: Oral care QID Follow up Recommendations: Inpatient Rehab SLP Visit Diagnosis: Cognitive communication deficit (R41.841);Dysphagia, oropharyngeal phase (R13.12) Plan: Continue with current plan of care       GO                Osie Bond., M.A. [facility] [facility] Pager [phone] Office [phone]  02/19/2020, 12:06 PM

## 2020-02-19 NOTE — Progress Notes (Signed)
This chaplain responded to PMT consult for preparing the Pt. Advance Directive:  HCPOA and Living Will.  The chaplain understands Advance Directive education was completed by PMT NP-MM.    The chaplain phoned the Pt. sister in law-[name] Raap to discuss next steps in notarizing the Pt. AD on Friday.  The chaplain communicated with [name] the coordinating of a notary and witnesses at the Pt. bedside will be attempted on Friday morning if the Pt. understands the purpose of the AD.  This chaplain will F/U with spiritual care as needed.

## 2020-02-20 ENCOUNTER — Encounter (HOSPITAL_COMMUNITY)
Admission: EM | Disposition: A | Discharge status: Rehabilitation Facility | Payer: Self-pay | Source: Home / Self Care | Attending: Internal Medicine

## 2020-02-20 DIAGNOSIS — I639 Cerebral infarction, unspecified: Secondary | ICD-10-CM

## 2020-02-20 DIAGNOSIS — E0590 Thyrotoxicosis, unspecified without thyrotoxic crisis or storm: Secondary | ICD-10-CM | POA: Diagnosis not present

## 2020-02-20 HISTORY — PX: LOOP RECORDER INSERTION: EP1214

## 2020-02-20 LAB — GLUCOSE, CAPILLARY: Glucose-Capillary: 114 mg/dL — ABNORMAL HIGH (ref 70–99)

## 2020-02-20 SURGERY — LOOP RECORDER INSERTION

## 2020-02-20 MED ORDER — LIDOCAINE-EPINEPHRINE 1 %-1:100000 IJ SOLN
INTRAMUSCULAR | Status: AC
  Filled 2020-02-20: qty 1

## 2020-02-20 MED ORDER — SODIUM CHLORIDE 0.9 % IV BOLUS
500.0000 mL | Freq: Once | INTRAVENOUS | Status: AC
  Administered 2020-02-20: 500 mL via INTRAVENOUS

## 2020-02-20 MED ORDER — LIDOCAINE-EPINEPHRINE 1 %-1:100000 IJ SOLN
INTRAMUSCULAR | Status: DC | PRN
  Administered 2020-02-20: 20 mL

## 2020-02-20 SURGICAL SUPPLY — 2 items
MONITOR REVEAL LINQ II (Prosthesis & Implant Heart) ×2 IMPLANT
PACK LOOP INSERTION (CUSTOM PROCEDURE TRAY) ×2 IMPLANT

## 2020-02-20 NOTE — Progress Notes (Signed)
Inpatient Rehab Admissions:  Inpatient Rehab Consult received.  I met with patient at the bedside for rehabilitation assessment and to discuss goals and expectations of an inpatient rehab admission.  He was working with therapy on my initial visit and mobilizing very well.  I think that based on his progress we could expect intermittent supervision to mod I level mobility with ELOS of 16-18 days.  We discussed that with Medicare prior authorization is not required for CIR.  I also discussed CIR with his sister and she is hopeful for CIR admission.  We did discuss that if he did not reach mod I level, she would not be able to provide care for him, so would need to pursue SNF.  We will follow for bed availability and plan for potential admit as early as this weekend.   Signed: Shann Medal, PT, DPT Admissions Coordinator [phone] 02/20/20  4:22 PM

## 2020-02-20 NOTE — H&P (Incomplete)
Physical Medicine and Rehabilitation Admission H&P    Chief Complaint  Patient presents with  . Code Stroke  : HPI: David Hendricks is a 70 year old right-handed male with history of tobacco abuse, COPD, CAD with stenting maintained on aspirin, hyperlipidemia, hyperthyroidism, BPH anxiety with depression.  Per chart review patient lives alone.  Reportedly independent prior to admission.  1 level apartment.  Presented 02/15/2020 with left-sided weakness and slurred speech.  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  Small focus of susceptibility artifact in this region representing small thrombosed distal cortical MCA branch.  Possible additional punctate infarct in the right parietal cortex versus infarct.  No substantial edema or mass-effect.  CT angiogram of the head and neck nondominant right vertebral artery is diminutive throughout its course and likely occluded intradurally otherwise no large vessel occlusion or proximal hemodynamically significant stenosis.  Admission chemistries unremarkable aside glucose 104 SARS current virus negative hemoglobin 18.3, TSH 0.919.  Urine drug screen negative.  Echocardiogram with ejection fraction of 60 to 65% no wall motion abnormalities grade 1 diastolic dysfunction.  Currently maintained on aspirin and Plavix for CVA prophylaxis x3 weeks then Plavix alone.  Awaiting plan for loop recorder.  Dysphagia #1 honey thick liquid diet.  Therapy evaluations completed due to patient's left-sided weakness and slurred speech was admitted for a comprehensive rehab program.  Review of Systems  Constitutional: Negative for chills and fever.  HENT: Negative for hearing loss.   Eyes: Negative for blurred vision and double vision.  Respiratory: Negative for cough and shortness of breath.   Cardiovascular: Negative for chest pain, palpitations and leg swelling.  Gastrointestinal: Positive for constipation. Negative for  heartburn, nausea and vomiting.       GERD  Genitourinary: Positive for urgency. Negative for dysuria, flank pain and hematuria.  Musculoskeletal: Positive for myalgias.  Skin: Negative for rash.  Neurological: Positive for speech change and weakness.       Vertigo  Psychiatric/Behavioral: Positive for depression.       Anxiety  All other systems reviewed and are negative.  Past Medical History:  Diagnosis Date  . Allergy   . Anxiety   . Aortic atherosclerosis ([place])   . Arthritis   . Benign prostatic hyperplasia (BPH) with urinary urgency   . Chronic kidney disease    KIDNEY STONES  . COPD (chronic obstructive pulmonary disease) ([place])    possible   . Coronary artery disease    10/18 PCI/DES to pLAD, mild nonobstructive disease in the Lcx/RCA. Normal EF.   Marland Kitchen Depression   . Dyslipidemia   . Dysrhythmia 2016   irregular heartbeat  . Gallstones   . GERD (gastroesophageal reflux disease)   . History of kidney stones   . Hyperlipidemia   . Hyperthyroidism   . Leg cramps   . Multiple pulmonary nodules   . Myocardial infarction ([place])    2018  . Pneumonia   . Seasonal allergies   . Thyroid nodule   . Vertigo   . Vitamin B 12 deficiency    Past Surgical History:  Procedure Laterality Date  . COLONOSCOPY    . CORONARY ANGIOPLASTY  10/09/2016  . CORONARY STENT INTERVENTION N/A 10/09/2016   Procedure: CORONARY STENT INTERVENTION;  Surgeon: Nelva Bush, MD;  Location: [facility];  Service: Cardiovascular;  Laterality: N/A;  . CYSTOSCOPY WITH RETROGRADE PYELOGRAM, URETEROSCOPY AND STENT PLACEMENT Left 06/17/2019   Procedure: CYSTOSCOPY WITH RETROGRADE PYELOGRAM, URETEROSCOPY AND STENT  PLACEMENT;  Surgeon: Robley Fries, MD;  Location: [facility];  Service: Urology;  Laterality: Left;  1 HR  . HOLMIUM LASER APPLICATION Left 04/15/5679   Procedure: HOLMIUM LASER APPLICATION;  Surgeon: Robley Fries, MD;  Location: [facility];  Service: Urology;  Laterality: Left;  .  INTRAVASCULAR ULTRASOUND/IVUS N/A 10/09/2016   Procedure: Intravascular Ultrasound/IVUS;  Surgeon: Nelva Bush, MD;  Location: [facility];  Service: Cardiovascular;  Laterality: N/A;  . IR CHOLANGIOGRAM EXISTING TUBE  11/18/2018  . IR PERC CHOLECYSTOSTOMY  11/16/2018  . LEFT HEART CATH AND CORONARY ANGIOGRAPHY N/A 10/09/2016   Procedure: LEFT HEART CATH AND CORONARY ANGIOGRAPHY;  Surgeon: Nelva Bush, MD;  Location: [facility];  Service: Cardiovascular;  Laterality: N/A;   Family History  Problem Relation Age of Onset  . Alcohol abuse Father        called [place] per pt  . Cancer Father        OAK CELL CANCER PER PT NON SURGICAL   . Colon cancer Neg Hx   . Colon polyps Neg Hx   . Esophageal cancer Neg Hx   . Rectal cancer Neg Hx   . Stomach cancer Neg Hx    Social History:  reports that he has been smoking cigarettes. He started smoking about 59 years ago. He has a 90.00 pack-year smoking history. He has never used smokeless tobacco. He reports that he does not drink alcohol and does not use drugs. Allergies: No Known Allergies Facility-Administered Medications Prior to Admission  Medication Dose Route Frequency Provider Last Rate Last Admin  . cyanocobalamin ((VITAMIN B-12)) injection 1,000 mcg  1,000 mcg Intramuscular Q30 days Horald Pollen, MD   1,000 mcg at 08/29/19 1525   Medications Prior to Admission  Medication Sig Dispense Refill  . albuterol (VENTOLIN HFA) 108 (90 Base) MCG/ACT inhaler INHALE 2 PUFFS INTO THE LUNGS EVERY 6 HOURS AS NEEDED (Patient taking differently: Inhale 2 puffs into the lungs every 6 (six) hours as needed for wheezing or shortness of breath.) 18 g 5  . aspirin EC 81 MG tablet Take 81 mg by mouth daily.    Marland Kitchen atorvastatin (LIPITOR) 80 MG tablet Take 1 tablet (80 mg total) by mouth daily. 90 tablet 3  . cyclobenzaprine (FLEXERIL) 5 MG tablet Take 5 mg by mouth 3 (three) times daily as needed for muscle spasms.    Marland Kitchen  gabapentin (NEURONTIN) 100 MG capsule Take 100 mg by mouth 3 (three) times daily as needed (pain).    . methimazole (TAPAZOLE) 5 MG tablet TAKE 1 TABLET(5 MG) BY MOUTH DAILY (Patient taking differently: Take 5 mg by mouth daily.) 90 tablet 1  . naproxen sodium (ALEVE) 220 MG tablet Take 220 mg by mouth daily as needed (mild pain).    . ondansetron (ZOFRAN) 4 MG tablet Take 1 tablet (4 mg total) by mouth every 6 (six) hours as needed for nausea or vomiting. 20 tablet 0  . OVER THE COUNTER MEDICATION Take 1 tablet by mouth daily as needed (nasal congestion). [employer] brand    . oxyCODONE-acetaminophen (PERCOCET) 5-325 MG tablet Take 1 tablet by mouth every 4 (four) hours as needed. (Patient taking differently: Take 1-1.5 tablets by mouth every 4 (four) hours as needed for moderate pain.) 20 tablet 0  . pantoprazole (PROTONIX) 40 MG tablet TAKE 1 TABLET BY MOUTH EVERY DAY (Patient taking differently: Take 40 mg by mouth daily as needed (heartburn).) 30 tablet 0  . tamsulosin (  FLOMAX) 0.4 MG CAPS capsule Take 0.4 mg by mouth daily.     . traMADol (ULTRAM) 50 MG tablet Take 50 mg by mouth every 6 (six) hours as needed for moderate pain.    . traZODone (DESYREL) 50 MG tablet Take 50 mg by mouth at bedtime.    . budesonide-formoterol (SYMBICORT) 80-4.5 MCG/ACT inhaler Inhale 2 puffs into the lungs 2 (two) times daily. (Patient not taking: Reported on 02/15/2020) 1 Inhaler 3    Drug Regimen Review Drug regimen was reviewed and remains appropriate with no significant issues identified  Home: Home Living Family/patient expects to be discharged to:: Private residence Living Arrangements: Alone Available Help at Discharge: Other (Comment) Type of Home: Apartment Home Access: Level entry Home Layout: One level Bathroom Shower/Tub: Chiropodist: Standard Bathroom Accessibility: Yes Home Equipment: None Additional Comments: Info taken from PT eval  Lives With: Alone    Functional History: Prior Function Level of Independence: Independent  Functional Status:  Mobility: Bed Mobility Overal bed mobility: Needs Assistance Bed Mobility: Supine to Sit,Sit to Supine Supine to sit: Min assist,HOB elevated Sit to supine: Min guard General bed mobility comments: MinA and increased time to get to EOB with HOB elevated, cues to scoot forward so B feet were on the floor. Lean much better today. Transfers Overall transfer level: Needs assistance Equipment used: Rolling walker (2 wheeled) Transfer via Lift Equipment: Stedy Transfers: Sit to/from [employer] to Stand: Hydrographic surveyor transfer comment: MInA to boost up to standing with RW, cues for hand placement and to get balance in standing before attempting gait Ambulation/Gait Ambulation/Gait assistance: Min assist Gait Distance (Feet): 70 Feet (42ft, 53ft) Assistive device: Rolling walker (2 wheeled) Gait Pattern/deviations: Step-through pattern,Trunk flexed,Trendelenburg General Gait Details: tends to push RW far ahead of him and required MinA for safety/control of device; moves very quickly and needed cues for slowing down to safe pace. SPO2 between 88-94% on room air with activity with one time drop to 86% but able to recover well with rest. Gait velocity: too fast to be considered safe given impairments    ADL: ADL Overall ADL's : Needs assistance/impaired Eating/Feeding: NPO Grooming: Wash/dry face,Standing,Min guard,Brushing hair Grooming Details (indicate cue type and reason): pt able to stand in stedy to complete face washing and brushing hair with up to min guard assist for balance Upper Body Bathing: Maximal assistance,Sitting Lower Body Bathing: Total assistance,Cueing for safety,Sitting/lateral leans,Sit to/from stand Upper Body Dressing : Moderate assistance,Sitting Upper Body Dressing Details (indicate cue type and reason): donning gown Lower Body Dressing: Total assistance,Bed  level Lower Body Dressing Details (indicate cue type and reason): to don socks Toilet Transfer: Min guard,Total assistance Toilet Transfer Details (indicate cue type and reason): minguard to sit<>stand to stedy, total A for the pivot in stedy Toileting- Clothing Manipulation and Hygiene: Total assistance,Sitting/lateral lean,Sit to/from stand,Cueing for safety Toileting - Clothing Manipulation Details (indicate cue type and reason): condom catheter on Functional mobility during ADLs: Minimal assistance,Min guard,+2 for safety/equipment General ADL Comments: pt able to complete multiple sit<>stands in stedy with minguard assist +2 for safety, pt able to stand at sink with minguard to complete grooming tasks. pt continues to present with impaired LUE [place] tasks, and visual deficits  Cognition: Cognition Overall Cognitive Status: Impaired/Different from baseline Arousal/Alertness: Awake/alert Orientation Level: Oriented X4 Attention: Sustained Sustained Attention: Impaired Sustained Attention Impairment: Verbal basic Memory: Impaired Memory Impairment: Storage deficit,Retrieval deficit,Decreased recall of new information Awareness: Impaired Awareness Impairment: Intellectual impairment,Emergent impairment Safety/Judgment: Impaired  Cognition Arousal/Alertness: Awake/alert Behavior During Therapy: WFL for tasks assessed/performed,Impulsive Overall Cognitive Status: Impaired/Different from baseline Area of Impairment: Orientation,Attention,Memory,Following commands,Safety/judgement,Awareness,Problem solving Orientation Level: Disoriented to,Time Current Attention Level: Sustained Memory: Decreased short-term memory Following Commands: Follows one step commands with increased time,Follows one step commands consistently Safety/Judgement: Decreased awareness of safety,Decreased awareness of deficits Awareness: Intellectual Problem Solving: Slow processing General Comments: calm and  cooperative but still with deficits in awareness of safety and overall deficits- impulsive and requiring multiple multimodal cues to prevent him from standing and moving prematurely. Still with need for increased processing time/command following.  Physical Exam: Blood pressure 117/71, pulse 72, temperature 98.3 F (36.8 C), temperature source Oral, resp. rate 18, height 6\' 1"  (1.854 m), weight 99.8 kg, SpO2 98 %. Physical Exam Neurological:     Comments: Patient is alert in no acute distress.  Speech is mildly dysarthric but intelligible.  Left facial droop.  Oriented x3 and follows commands.  Fair insight and awareness     Results for orders placed or performed during the hospital encounter of 02/15/20 (from the past 48 hour(s))  Glucose, capillary     Status: Abnormal   Collection Time: 02/20/20  3:06 PM  Result Value Ref Range   Glucose-Capillary 114 (H) 70 - 99 mg/dL    Comment: Glucose reference range applies only to samples taken after fasting for at least 8 hours.   No results found.     Medical Problem List and Plan: 1.  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  Planning loop recorder.  -patient may *** shower  -ELOS/Goals: *** 2.  Antithrombotics: -DVT/anticoagulation: SCDs  -antiplatelet therapy: Aspirin 81 mg daily and Plavix 75 mg day x3 weeks and then Plavix alone 3. Pain Management: Tylenol as needed 4. Mood: Trazodone 50 mg nightly, melatonin 3 mg nightly  -antipsychotic agents: N/A 5. Neuropsych: This patient is capable of making decisions on his own behalf. 6. Skin/Wound Care: Routine skin checks 7. Fluids/Electrolytes/Nutrition: Routine in and outs with follow-up chemistries 8.  Dysphagia.  Dysphagia #1 honey thick liquids.  Follow-up speech therapy.  Monitor hydration 9.  Hyperlipidemia.  Lipitor 10.  Hypothyroidism.  Continue Tapazole 11.  Tobacco abuse.  NicoDerm patch.  Provide counseling 12.  BPH.  Flomax  0.4 mg daily.  Check PVR 13.  GERD.  Protonix 14.  CAD with history of stenting.  Continue Plavix and aspirin  ***  Cathlyn Parsons, PA-C 02/22/2020

## 2020-02-20 NOTE — Care Management Important Message (Signed)
Important Message  Patient Details  Name: David Hendricks MRN: [id number] Date of Birth: 1950/02/18   Medicare Important Message Given:  Yes     [name] P [place] 02/20/2020, 1:11 PM

## 2020-02-20 NOTE — Discharge Instructions (Signed)

## 2020-02-20 NOTE — Progress Notes (Signed)
This chaplain is present at the Pt. bedside with the Pt. brother-Jeff and sister in law-[name].  The Pt. is awake and conversational.   The chaplain completed Pt. AD education. The chaplain answered the Pt. AD questions.  The chaplain joined the notary and two witnesses for notarizing of the Pt. Advance Directive.    The Pt. named David Hendricks as his healthcare agent and Skipper Dacosta as the person if the healthcare agent is unable or unwilling to serve as his next choice.  The Pt. was given the original AD and two copies.  A copy was scanned into the Pt. EMR.  This chaplain is available for F/U spiritual care as needed.

## 2020-02-20 NOTE — Progress Notes (Signed)
Occupational Therapy Treatment Patient Details Name: David Hendricks MRN: [id number] DOB: 1950/04/01 Today's Date: 02/20/2020    History of present illness Pt is a 70 y/o male admitted secondary to slurred speech and L sided weakness. 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 PMH includes tobacco use, CAD, COPD, anxiety, and depression.   OT comments  Pt making steady progress towards OT goals this session. Pt continues to present with decreased [place] in LUE, cogntive deficits, L visual deficits and decreased strength and functional endurance impacting pts ability to complete BADLs independently. Pt able to complete multiple sit<>stands in stedy with MIN guard assist +2 mostly for safety. Pt also able to complete standing grooming tasks at sink with no UE support and min guard assist. Pt would continue to benefit from skilled occupational therapy while admitted and after d/c to address the below listed limitations in order to improve overall functional mobility and facilitate independence with BADL participation. DC plan remains appropriate, will follow acutely per POC.     Follow Up Recommendations  CIR    Equipment Recommendations  3 in 1 bedside commode;Wheelchair (measurements OT);Wheelchair cushion (measurements OT)    Recommendations for Other Services      Precautions / Restrictions Precautions Precautions: Fall;Other (comment) Precaution Comments: can be agitated and combative- getting OOB and up to nurse station in recliner seems to help (claustrophobic), L weakness and lean Restrictions Weight Bearing Restrictions: No       Mobility Bed Mobility Overal bed mobility: Needs Assistance Bed Mobility: Supine to Sit     Supine to sit: Min assist;HOB elevated     General bed mobility comments: MIN A to transition EOB with HOB elevated, pt impulsive with mobility needing step by step cues for safety and for steadying assist  Transfers Overall transfer level: Needs  assistance Equipment used: Ambulation equipment used Transfers: Sit to/from Stand Sit to Stand: Min guard;+2 safety/equipment         General transfer comment: pt sit<>stand multiple times during session needing MIN guard assist +2 for safety    Balance Overall balance assessment: Needs assistance Sitting-balance support: Feet supported;No upper extremity supported Sitting balance-Leahy Scale: Fair Sitting balance - Comments: minguard for safety when sitting EOB d/t impulsivity   Standing balance support: Single extremity supported;No upper extremity supported;During functional activity Standing balance-Leahy Scale: Fair Standing balance comment: pt able to stand in stedy with no UE support to complete grooming tasks with min guard assist                           ADL either performed or assessed with clinical judgement   ADL Overall ADL's : Needs assistance/impaired     Grooming: Wash/dry face;Standing;Min guard;Brushing hair Grooming Details (indicate cue type and reason): pt able to stand in stedy to complete face washing and brushing hair with up to min guard assist for balance         Upper Body Dressing : Moderate assistance;Sitting Upper Body Dressing Details (indicate cue type and reason): donning gown Lower Body Dressing: Total assistance;Bed level Lower Body Dressing Details (indicate cue type and reason): to don socks Toilet Transfer: Min guard;Total assistance Toilet Transfer Details (indicate cue type and reason): minguard to sit<>stand to stedy, total A for the pivot in stedy         Functional mobility during ADLs: Minimal assistance;Min guard;+2 for safety/equipment General ADL Comments: pt able to complete multiple sit<>stands in stedy with minguard assist +2  for safety, pt able to stand at sink with minguard to complete grooming tasks. pt continues to present with impaired LUE [place] tasks, and visual deficits     Vision Baseline  Vision/History: Wears glasses Wears Glasses: Reading only Patient Visual Report: Other (comment);Blurring of vision (pt not following commands when attempting to complete visual assessment however pt does endorse that L visual field appears blurry) Vision Assessment?: Vision impaired- to be further tested in functional context;Yes Eye Alignment: Within Functional Limits Ocular Range of Motion: Restricted on the left;Impaired-to be further tested in functional context Tracking/Visual Pursuits: Decreased smoothness of eye movement to LEFT superior field Additional Comments: pt with difficulty following commands related to visual assessment however after completing table top visual tasks pt noted to start task on R side, pt did locate all items in L visual field but noted to have difficulty tracking to L superior quadrant during asssessment. cognition continues to be a barrier as pt not following commands fully during assessment   Perception     Praxis      Cognition Arousal/Alertness: Awake/alert Behavior During Therapy: WFL for tasks assessed/performed;Impulsive (impulsive with mobility tasks) Overall Cognitive Status: Impaired/Different from baseline Area of Impairment: Orientation;Attention;Memory;Following commands;Safety/judgement;Awareness;Problem solving                 Orientation Level: Disoriented to;Time (able to state name and DOB, states its Novemeber. when cues about approaching [place] of valentines day pt continues to answer incorrectly) Current Attention Level: Sustained Memory: Decreased short-term memory Following Commands: Follows one step commands inconsistently;Follows one step commands with increased time Safety/Judgement: Decreased awareness of safety Awareness: Intellectual Problem Solving: Slow processing General Comments: pt calm this session, not anxious but slightly impulsive with mobility. pt disoriented to time but able to recall beng in hospital for CVA,  pt does endorse LUE decreased coodination. Pt with decreased ability to follow commands needing increased time and following commands ~ 75 % of session        Exercises Other Exercises Other Exercises: pt able to write name with LUE, pt completed table top visual task where pt instructed pt locate all squares on paper, pt completed with 100% accuracy but noted to start tracking items on R upper quadrant of paper   Shoulder Instructions       General Comments pt on Ra during session with sats >91% during session    Pertinent Vitals/ Pain       Pain Assessment: Faces Faces Pain Scale: Hurts a little bit Pain Location: low back Pain Descriptors / Indicators: Discomfort Pain Intervention(s): Monitored during session;Repositioned  Home Living                                          Prior Functioning/Environment              Frequency  Min 2X/week        Progress Toward Goals  OT Goals(current goals can now be found in the care plan section)  Progress towards OT goals: Progressing toward goals  Acute Rehab OT Goals Patient Stated Goal: none stated Time For Goal Achievement: 03/02/20 Potential to Achieve Goals: Good  Plan Discharge plan remains appropriate;Frequency remains appropriate    Co-evaluation                 AM-PAC OT "6 Clicks" Daily Activity     Outcome Measure   Help from  another person eating meals?: None Help from another person taking care of personal grooming?: None Help from another person toileting, which includes using toliet, bedpan, or urinal?: A Little Help from another person bathing (including washing, rinsing, drying)?: A Lot Help from another person to put on and taking off regular upper body clothing?: A Little Help from another person to put on and taking off regular lower body clothing?: A Lot 6 Click Score: 18    End of Session Equipment Utilized During Treatment: Gait belt;Other (comment) (stedy)  OT  Visit Diagnosis: Unsteadiness on feet (R26.81);Muscle weakness (generalized) (M62.81);Other symptoms and signs involving cognitive function;Hemiplegia and hemiparesis   Activity Tolerance Patient tolerated treatment well   Patient Left in chair;with call bell/phone within reach;with chair alarm set   Nurse Communication Mobility status        Time: 0940-1000 OT Time Calculation (min): 20 min  Charges: OT General Charges $OT Visit: 1 Visit OT Treatments $Self Care/Home Management : 8-22 mins  David Alto., COTA/L Acute Rehabilitation Services [phone]    David Hendricks 02/20/2020, 11:01 AM

## 2020-02-20 NOTE — Progress Notes (Signed)
Physical Therapy Treatment Patient Details Name: David Hendricks MRN: [id number] DOB: [birth year] Today's Date: 02/20/2020    History of Present Illness Pt is a 70 y/o male admitted secondary to slurred speech and L sided weakness. 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 PMH includes tobacco use, CAD, COPD, anxiety, and depression.    PT Comments    Patient received in bed, pleasant and cooperative with therapies today. Able to mobilize on a minA basis with RW, however did require cues for safety, sequencing, and to reduce impulsivity with mobility today. Tolerated significant progression of activity with SpO2 on room air 88-94% on room air but did seem to have some shortness of breath. Continues to demonstrate reduced coordination and safety awareness. Politely declines staying in recliner due to chronic back pain, so left in bed with all needs met and bed alarm active. Continue to recommend CIR.     Follow Up Recommendations  CIR;Supervision/Assistance - 24 hour     Equipment Recommendations  Rolling walker with 5" wheels;3in1 (PT);Wheelchair (measurements PT);Wheelchair cushion (measurements PT)    Recommendations for Other Services       Precautions / Restrictions Precautions Precautions: Fall;Other (comment) Precaution Comments: L weakness and lean, impulsive Restrictions Weight Bearing Restrictions: No    Mobility  Bed Mobility Overal bed mobility: Needs Assistance Bed Mobility: Supine to Sit;Sit to Supine     Supine to sit: Min assist;HOB elevated Sit to supine: Min guard   General bed mobility comments: MinA and increased time to get to EOB with HOB elevated, cues to scoot forward so B feet were on the floor. Lean much better today.    Transfers Overall transfer level: Needs assistance Equipment used: Rolling walker (2 wheeled) Transfers: Sit to/from Stand Sit to Stand: Min assist         General transfer comment: MInA to boost up to standing with RW, cues  for hand placement and to get balance in standing before attempting gait  Ambulation/Gait Ambulation/Gait assistance: Min assist Gait Distance (Feet): 70 Feet (95f, 324f Assistive device: Rolling walker (2 wheeled) Gait Pattern/deviations: Step-through pattern;Trunk flexed;Trendelenburg Gait velocity: too fast to be considered safe given impairments   General Gait Details: tends to push RW far ahead of him and required MinA for safety/control of device; moves very quickly and needed cues for slowing down to safe pace. SPO2 between 88-94% on room air with activity with one time drop to 86% but able to recover well with rest.   Stairs             Wheelchair Mobility    Modified Rankin (Stroke Patients Only)       Balance Overall balance assessment: Needs assistance Sitting-balance support: Feet supported;No upper extremity supported Sitting balance-Leahy Scale: Fair Sitting balance - Comments: min guard for safety   Standing balance support: Bilateral upper extremity supported;During functional activity Standing balance-Leahy Scale: Fair Standing balance comment: benefits from BUE support                            Cognition Arousal/Alertness: Awake/alert Behavior During Therapy: WFL for tasks assessed/performed;Impulsive Overall Cognitive Status: Impaired/Different from baseline Area of Impairment: Orientation;Attention;Memory;Following commands;Safety/judgement;Awareness;Problem solving                 Orientation Level: Disoriented to;Time Current Attention Level: Sustained Memory: Decreased short-term memory Following Commands: Follows one step commands with increased time;Follows one step commands consistently Safety/Judgement: Decreased awareness of safety;Decreased awareness of deficits  Awareness: Intellectual Problem Solving: Slow processing General Comments: calm and cooperative but still with deficits in awareness of safety and overall  deficits- impulsive and requiring multiple multimodal cues to prevent him from standing and moving prematurely. Still with need for increased processing time/command following.      Exercises Other Exercises Other Exercises: pt able to write name with LUE, pt completed table top visual task where pt instructed pt locate all squares on paper, pt completed with 100% accuracy but noted to start tracking items on R upper quadrant of paper    General Comments General comments (skin integrity, edema, etc.): pt on Ra during session with sats >91% during session      Pertinent Vitals/Pain Pain Assessment: Faces Faces Pain Scale: No hurt Pain Location: low back Pain Descriptors / Indicators: Discomfort Pain Intervention(s): Limited activity within patient's tolerance;Monitored during session;Repositioned    Home Living                      Prior Function            PT Goals (current goals can now be found in the care plan section) Acute Rehab PT Goals Patient Stated Goal: none stated PT Goal Formulation: With patient Time For Goal Achievement: 03/01/20 Potential to Achieve Goals: Fair Progress towards PT goals: Progressing toward goals    Frequency    Min 3X/week      PT Plan Current plan remains appropriate;Equipment recommendations need to be updated    Co-evaluation              AM-PAC PT "6 Clicks" Mobility   Outcome Measure  Help needed turning from your back to your side while in a flat bed without using bedrails?: A Little Help needed moving from lying on your back to sitting on the side of a flat bed without using bedrails?: A Little Help needed moving to and from a bed to a chair (including a wheelchair)?: A Little Help needed standing up from a chair using your arms (e.g., wheelchair or bedside chair)?: A Little Help needed to walk in hospital room?: A Little Help needed climbing 3-5 steps with a railing? : A Lot 6 Click Score: 17    End of  Session Equipment Utilized During Treatment: Gait belt Activity Tolerance: Patient tolerated treatment well Patient left: in bed;with call bell/phone within reach;with bed alarm set Nurse Communication: Mobility status PT Visit Diagnosis: Unsteadiness on feet (R26.81);Muscle weakness (generalized) (M62.81);Difficulty in walking, not elsewhere classified (R26.2);Hemiplegia and hemiparesis Hemiplegia - Right/Left: Left Hemiplegia - caused by: Cerebral infarction     Time: 7579-7282 PT Time Calculation (min) (ACUTE ONLY): 28 min  Charges:  $Gait Training: 8-22 mins $Therapeutic Activity: 8-22 mins                     Windell Norfolk, DPT, PN1   Supplemental Physical Therapist [place]    Pager [phone] Acute Rehab Office [phone]

## 2020-02-20 NOTE — Progress Notes (Signed)
Progress Note    David Hendricks  ONG:[id number] DOB: March 10, 1950  DOA: 02/15/2020 PCP: Horald Pollen, MD    Brief Narrative:     Medical records reviewed and are as summarized below:  David Hendricks is an 70 y.o. male with a PMH oftobacco abuse 2 pack a day, COPD, CAD, HLD, hyperthyroidism, thyroid nodule, BPH, anxiety and depression, who presented to the ED via EMS with slurred speech and left-sided weakness. CODE STROKE activated. In the ED, evaluated by neurologist who noted dysarthria and a left facial droop. 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  Assessment/Plan:   Principal Problem:   CVA (cerebral vascular accident) ([place]) Active Problems:   Tobacco abuse   History of non-ST elevation myocardial infarction (NSTEMI)   Hyperlipidemia   Old MI (myocardial infarction)   CAD (coronary artery disease)   Cigarette nicotine dependence without complication   BPH (benign prostatic hyperplasia)   Multinodular goiter   Subclinical hyperthyroidism   Pulmonary emphysema (HCC)   Aortic atherosclerosis (HCC)   Thyroid mass   Anxiety and depression   Obesity   Hyperbilirubinemia   Dysphagia following cerebrovascular accident   Dysarthria due to recent cerebral infarction    Acute CVA (cerebrovascular accident) with moderate dysarthria, left facial weakness and dysphagia POA -telemetry.  -tPA not given because patient outside of normal window. -CT head showed no evidence of acute large vascular territory infarct. 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 CTA of head/neck showed nondominant right  vertebral artery is diminutive throughout its course and likely occluded intradurally.  aspirin 81 mg daily and clopidogrel 75 mg daily. Continue DAPT for 3 weeks and then plavix alone -FLP showed a cholesterol of 168, LDL 111. Continue statin.   -HgbA1c 5.2%.  -2 D Echocardiogram showed EF 44-01%, grade I diastolic dysfunction and mild LVH.  -PT recommending SNF.  Mental status (re: agitation) seems to continue to improve but continues with poor insight  -SLP following: DYS diet -palliative consult placed for GOC -loop prior to d/c if able    Agitation -appears resolved -calm and cooperative    Hyperbilirubinemia Mild, unclear etiology. Outpatient work up recommended.    Tobacco abuse, POA RN to provide tobacco cessation counseling. Continue nicoderm patch 21 mg (smokes 2 PPD).    Hyperlipidemia, POA FLP showed a cholesterol of 168, LDL 111. Continue statin.    CAD (coronary artery disease) with h/o MI and aortic atherosclerosis, POA Continue ASA/statin.    Subclinical hyperthyroidism/thyroid mass/multinodular goiter, POA Continue methimazole.    Pulmonary emphysema (HCC) Continue PRN albuterol. Not taking symbicort anymore.    Anxiety and depression Continue Desyrel QHS.    BPH, POA Continue Flomax.   Progression: Sister says he has no close family that lives near-by and she lives in [place]. He has no children. Palliative care consulted to assist with goals of care and disposition. Sister confirms he will likely refuse SNF   Family Communication/Anticipated D/C date and plan/Code Status    Code Status: Full Code.  Disposition Plan: Status is: Inpatient  Remains inpatient appropriate because:Inpatient level of care appropriate due to severity of illness   Dispo: The patient is from: Home  Anticipated d/c is to: tbd              Anticipated d/c date is: 2 days              Patient currently is not medically stable to d/c.   Difficult to place  patient No         Medical Consultants:    Neurology  Palliative care  Subjective:   Lost his glasses this AM (was laying on them)  Objective:    Vitals:   02/20/20 0206 02/20/20 0542 02/20/20 0846 02/20/20 1200  BP: 115/67 114/64 115/63 113/68  Pulse: 74 72 79 79  Resp: 18 18 18 18   Temp: 98 F (36.7 C) 98 F (36.7 C) 98 F (36.7 C) 98.2 F (36.8 C)  TempSrc: Oral Oral Oral Oral  SpO2: 94% 93% 90% 96%  Weight:      Height:        Intake/Output Summary (Last 24 hours) at 02/20/2020 1323 Last data filed at 02/20/2020 0300 Gross per 24 hour  Intake -  Output 1300 ml  Net -1300 ml   Filed Weights   02/15/20 1355  Weight: 99.8 kg    Exam:  General: Appearance:     Overweight male in no acute distress     Lungs:      respirations unlabored  Heart:    Normal heart rate. Normal rhythm. No murmurs, rubs, or gallops.   MS:   All extremities are intact.   Neurologic:   Awake, alert, speech slurred but understandable    Data Reviewed:   I have personally reviewed following labs and imaging studies:  Labs: Labs show the following:   Basic Metabolic Panel: Recent Labs  Lab 02/15/20 1325 02/15/20 1333 02/16/20 0500 02/19/20 0218  NA 139 139 137 137  K 4.4 3.9 3.7 3.6  CL 102 102 101 102  CO2 25  --  24 23  GLUCOSE 104* 104* 92 83  BUN 9 10 10 11   CREATININE 1.00 0.90 0.98 0.85  CALCIUM 9.6  --  8.8* 8.4*   GFR Estimated Creatinine Clearance: 100.5 mL/min (by C-G formula based on SCr of 0.85 mg/dL). Liver Function Tests: Recent Labs  Lab 02/15/20 1325 02/16/20 0500  AST 33 24  ALT 18 17  ALKPHOS 106 95  BILITOT 1.5* 1.5*  PROT 6.7 6.3*  ALBUMIN 3.6 3.5   No results for input(s): LIPASE, AMYLASE in the last 168 hours. No results for input(s): AMMONIA in the last 168 hours. Coagulation profile Recent Labs  Lab 02/15/20 1325  INR 0.9    CBC: Recent Labs  Lab 02/15/20 1325 02/15/20 1333 02/16/20 0500 02/19/20 0218  WBC 9.7   --  13.9* 9.8  NEUTROABS 6.3  --   --   --   HGB 18.3* 18.4* 16.9 16.4  HCT 55.2* 54.0* 48.6 49.3  MCV 86.4  --  85.7 86.5  PLT 192  --  170 160   Cardiac Enzymes: Recent Labs  Lab 02/15/20 1815  CKTOTAL 62   BNP (last 3 results) No results for input(s): PROBNP in the last 8760 hours. CBG: Recent Labs  Lab 02/15/20 1408  GLUCAP 96   D-Dimer: No results for input(s): DDIMER in the last 72 hours. Hgb A1c: No results for input(s): HGBA1C in the last 72 hours. Lipid Profile: No results for input(s): CHOL, HDL, LDLCALC, TRIG, CHOLHDL, LDLDIRECT in the last 72 hours. Thyroid function studies: No results for input(s): TSH, T4TOTAL, T3FREE,  THYROIDAB in the last 72 hours.  Invalid input(s): FREET3 Anemia work up: No results for input(s): VITAMINB12, FOLATE, FERRITIN, TIBC, IRON, RETICCTPCT in the last 72 hours. Sepsis Labs: Recent Labs  Lab 02/15/20 1325 02/15/20 1815 02/16/20 0500 02/19/20 0218  WBC 9.7  --  13.9* 9.8  LATICACIDVEN  --  1.0  --   --     Microbiology Recent Results (from the past 240 hour(s))  SARS CORONAVIRUS 2 (TAT 6-24 HRS) Nasopharyngeal Nasopharyngeal Swab     Status: None   Collection Time: 02/15/20  2:26 PM   Specimen: Nasopharyngeal Swab  Result Value Ref Range Status   SARS Coronavirus 2 NEGATIVE NEGATIVE Final    Comment: (NOTE) SARS-CoV-2 target nucleic acids are NOT DETECTED.  The SARS-CoV-2 RNA is generally detectable in upper and lower respiratory specimens during the acute phase of infection. Negative results do not preclude SARS-CoV-2 infection, do not rule out co-infections with other pathogens, and should not be used as the sole basis for treatment or other patient management decisions. Negative results must be combined with clinical observations, patient history, and epidemiological information. The expected result is Negative.  Fact Sheet for Patients: SugarRoll.be  Fact Sheet for Healthcare  Providers: https://www.woods-mathews.com/  This test is not yet approved or cleared by the [place] FDA and  has been authorized for detection and/or diagnosis of SARS-CoV-2 by FDA under an Emergency Use Authorization (EUA). This EUA will remain  in effect (meaning this test can be used) for the duration of the COVID-19 declaration under Se ction 564(b)(1) of the Act, 21 U.S.C. section 360bbb-3(b)(1), unless the authorization is terminated or revoked sooner.  Performed at [facility], [place] [address], [place], [place] [address]     Procedures and diagnostic studies:  DG Swallowing Func-Speech Pathology  Result Date: 02/18/2020 Objective Swallowing Evaluation: Type of Study: MBS-Modified Barium Swallow Study  Patient Details Name: Jeovany Huitron MRN: [id number] Date of Birth: [birth year] Today's Date: 02/18/2020 Time: SLP Start Time (ACUTE ONLY): 1409 -SLP Stop Time (ACUTE ONLY): 1427 SLP Time Calculation (min) (ACUTE ONLY): 18 min Past Medical History: Past Medical History: Diagnosis Date . Allergy  . Anxiety  . Aortic atherosclerosis ([place])  . Arthritis  . Benign prostatic hyperplasia (BPH) with urinary urgency  . Chronic kidney disease   KIDNEY STONES . COPD (chronic obstructive pulmonary disease) ([place])   possible  . Coronary artery disease   10/18 PCI/DES to pLAD, mild nonobstructive disease in the Lcx/RCA. Normal EF.  [place] Kitchen Depression  . Dyslipidemia  . Dysrhythmia 2016  irregular heartbeat . Gallstones  . GERD (gastroesophageal reflux disease)  . History of kidney stones  . Hyperlipidemia  . Hyperthyroidism  . Leg cramps  . Multiple pulmonary nodules  . Myocardial infarction ([place])   2018 . Pneumonia  . Seasonal allergies  . Thyroid nodule  . Vertigo  . Vitamin B 12 deficiency  Past Surgical History: Past Surgical History: Procedure Laterality Date . COLONOSCOPY   . CORONARY ANGIOPLASTY  10/09/2016 . CORONARY STENT INTERVENTION N/A 10/09/2016  Procedure: CORONARY STENT INTERVENTION;   Surgeon: Nelva Bush, MD;  Location: [facility];  Service: Cardiovascular;  Laterality: N/A; . CYSTOSCOPY WITH RETROGRADE PYELOGRAM, URETEROSCOPY AND STENT PLACEMENT Left 06/17/2019  Procedure: CYSTOSCOPY WITH RETROGRADE PYELOGRAM, URETEROSCOPY AND STENT PLACEMENT;  Surgeon: Robley Fries, MD;  Location: [facility];  Service: Urology;  Laterality: Left;  1 HR . HOLMIUM LASER APPLICATION Left 09/11/2353  Procedure: HOLMIUM LASER APPLICATION;  Surgeon: Robley Fries,  MD;  Location: [facility];  Service: Urology;  Laterality: Left; . INTRAVASCULAR ULTRASOUND/IVUS N/A 10/09/2016  Procedure: Intravascular Ultrasound/IVUS;  Surgeon: Nelva Bush, MD;  Location: [facility];  Service: Cardiovascular;  Laterality: N/A; . IR CHOLANGIOGRAM EXISTING TUBE  11/18/2018 . IR PERC CHOLECYSTOSTOMY  11/16/2018 . LEFT HEART CATH AND CORONARY ANGIOGRAPHY N/A 10/09/2016  Procedure: LEFT HEART CATH AND CORONARY ANGIOGRAPHY;  Surgeon: Nelva Bush, MD;  Location: [facility];  Service: Cardiovascular;  Laterality: N/A; HPI: Pt is a 70 y/o male admitted secondary to slurred speech and L sided weakness. 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 PMH includes tobacco use, CAD, COPD, anxiety, and depression.  Subjective: alert, cooperative, dysarthric Assessment / Plan / Recommendation CHL IP CLINICAL IMPRESSIONS 02/18/2020 Clinical Impression Although pt presents with similar areas of weakness compared to yesterday's MBS, his improved mentation has a positive impact on his performance and ability to incorporate swallowing strategies. Head turns do not have a significant effect, but he has mildly improved vallecular clearance with use of a chin tuck and hard swallows. Residue is still significant, but he demonstrated some awareness when he spontaneously brought most of the residue back into his mouth to swallow it again, clearing more of it on the second try. On subsequent swallows, pt could be cued to do the  same. Penetration and trace aspiration occurs with nectar thick liquids that still spill into the airway before he swallows, but no aspiration is observed with honey thick liquids or purees. There is trace amounts of penetration that is not always fully cleared, and when also considering the amount of vallecular residue he has, he would still remain at risk for some aspiration across the duration of a meal. Discussed with pt and MD, who is in agreement with starting modified diet with use of precautions to reduce but not eliminate aspiration risk while palliative care consult is pending. Therefore, will start Dys 1 diet and honey thick liquids with the following  precautions: chin tuck, alternate solids/liquids, hard swallows, cue to bring food back into his mouth and swallow again.  SLP Visit Diagnosis Dysphagia, oropharyngeal phase (R13.12) Attention and concentration deficit following -- Frontal lobe and executive function deficit following -- Impact on safety and function Moderate aspiration risk   CHL IP TREATMENT RECOMMENDATION 02/18/2020 Treatment Recommendations Therapy as outlined in treatment plan below   Prognosis 02/18/2020 Prognosis for Safe Diet Advancement Good Barriers to Reach Goals Cognitive deficits;Severity of deficits Barriers/Prognosis Comment -- CHL IP DIET RECOMMENDATION 02/18/2020 SLP Diet Recommendations Dysphagia 1 (Puree) solids;Honey thick liquids Liquid Administration via Cup Medication Administration Crushed with puree Compensations Minimize environmental distractions;Slow rate;Follow solids with liquid;Chin tuck;Other (Comment) Postural Changes Seated upright at 90 degrees;Remain semi-upright after after feeds/meals (Comment)   CHL IP OTHER RECOMMENDATIONS 02/18/2020 Recommended Consults -- Oral Care Recommendations Oral care QID Other Recommendations Have oral suction available;Order thickener from pharmacy;Prohibited food (jello, ice cream, thin soups);Remove water pitcher   CHL IP FOLLOW  UP RECOMMENDATIONS 02/18/2020 Follow up Recommendations Inpatient Rehab   CHL IP FREQUENCY AND DURATION 02/18/2020 Speech Therapy Frequency (ACUTE ONLY) min 2x/week Treatment Duration 2 weeks      CHL IP ORAL PHASE 02/18/2020 Oral Phase Impaired Oral - Pudding Teaspoon -- Oral - Pudding Cup -- Oral - Honey Teaspoon -- Oral - Honey Cup Reduced posterior propulsion;Decreased bolus cohesion;Lingual/palatal residue;Weak lingual manipulation Oral - Nectar Teaspoon Reduced posterior propulsion;Decreased bolus cohesion;Lingual/palatal residue;Weak lingual manipulation Oral - Nectar Cup -- Oral - Nectar Straw --  Oral - Thin Teaspoon -- Oral - Thin Cup -- Oral - Thin Straw -- Oral - Puree Reduced posterior propulsion;Decreased bolus cohesion;Lingual/palatal residue;Weak lingual manipulation Oral - Mech Soft -- Oral - Regular -- Oral - Multi-Consistency -- Oral - Pill -- Oral Phase - Comment --  CHL IP PHARYNGEAL PHASE 02/18/2020 Pharyngeal Phase Impaired Pharyngeal- Pudding Teaspoon -- Pharyngeal -- Pharyngeal- Pudding Cup -- Pharyngeal -- Pharyngeal- Honey Teaspoon -- Pharyngeal -- Pharyngeal- Honey Cup Reduced pharyngeal peristalsis;Reduced epiglottic inversion;Reduced anterior laryngeal mobility;Reduced laryngeal elevation;Reduced tongue base retraction;Pharyngeal residue - valleculae;Penetration/Aspiration before swallow Pharyngeal Material enters airway, remains ABOVE vocal cords and not ejected out Pharyngeal- Nectar Teaspoon Reduced pharyngeal peristalsis;Reduced epiglottic inversion;Reduced anterior laryngeal mobility;Reduced laryngeal elevation;Reduced tongue base retraction;Pharyngeal residue - valleculae;Penetration/Aspiration before swallow Pharyngeal Material enters airway, passes BELOW cords without attempt by patient to eject out (silent aspiration) Pharyngeal- Nectar Cup -- Pharyngeal -- Pharyngeal- Nectar Straw -- Pharyngeal -- Pharyngeal- Thin Teaspoon -- Pharyngeal -- Pharyngeal- Thin Cup -- Pharyngeal --  Pharyngeal- Thin Straw -- Pharyngeal -- Pharyngeal- Puree Reduced pharyngeal peristalsis;Reduced epiglottic inversion;Reduced anterior laryngeal mobility;Reduced laryngeal elevation;Reduced tongue base retraction;Pharyngeal residue - valleculae;Penetration/Apiration after swallow Pharyngeal Material enters airway, remains ABOVE vocal cords then ejected out Pharyngeal- Mechanical Soft -- Pharyngeal -- Pharyngeal- Regular -- Pharyngeal -- Pharyngeal- Multi-consistency -- Pharyngeal -- Pharyngeal- Pill -- Pharyngeal -- Pharyngeal Comment --  CHL IP CERVICAL ESOPHAGEAL PHASE 02/18/2020 Cervical Esophageal Phase WFL Pudding Teaspoon -- Pudding Cup -- Honey Teaspoon -- Honey Cup -- Nectar Teaspoon -- Nectar Cup -- Nectar Straw -- Thin Teaspoon -- Thin Cup -- Thin Straw -- Puree -- Mechanical Soft -- Regular -- Multi-consistency -- Pill -- Cervical Esophageal Comment -- Osie Bond., M.A. [facility] Pager [phone] Office [phone] 02/18/2020, 4:35 PM               Medications:   .  stroke: mapping our early stages of recovery book   Does not apply Once  . aspirin EC  81 mg Oral Daily  . atorvastatin  80 mg Oral Daily  . chlorhexidine  15 mL Mouth Rinse BID  . clopidogrel  75 mg Oral Daily  . mouth rinse  15 mL Mouth Rinse q12n4p  . methimazole  5 mg Oral Daily  . nicotine  21 mg Transdermal Daily  . pantoprazole  40 mg Oral BID  . tamsulosin  0.4 mg Oral Daily  . traZODone  50 mg Oral QHS   Continuous Infusions:    LOS: 5 days   Geradine Girt  [facility]   How to contact the [facility] Attending or Consulting provider [place] or covering provider during after hours [place], for this patient?  1. Check the care team in [facility] and look for a) attending/consulting [facility] provider listed and b) the [facility] team listed 2. Log into [email or web address] and use [place]'s universal password to access. If you do not have the password, please contact the hospital operator. 3. Locate the  [facility] provider you are looking for under [facility] and page to a number that you can be directly reached. 4. If you still have difficulty reaching the provider, please page the [facility] (Director on Call) for the Hospitalists listed on amion for assistance.  02/20/2020, 1:23 PM

## 2020-02-20 NOTE — PMR Pre-admission (Addendum)
PMR Admission Coordinator Pre-Admission Assessment  Patient: David Hendricks is an 70 y.o., male MRN: [id number] DOB: 09-18-1950 Height: 6\' 1"  (185.4 cm) Weight: 99.8 kg              Insurance Information HMO:     PPO:      PCP:      IPA:      80/20:      OTHER:  PRIMARY: Medicare A/B      Policy#: [id number]      Subscriber:  CM Name:       Phone#:      Fax#:  Pre-Cert#: verified online      Employer:  Benefits:  Phone #:      Name:  Eff. Date: A and B 05/10/14     Deduct: $1556      Out of Pocket Max: n/a      Life Max: n/a  CIR: 100%      SNF: 20 full days Outpatient: 80%     Co Ins: 20% Home Health: 100%      Co-Pay:  DME: 80%     Co-Pay: 20% Providers: pt choice SECONDARY: Medicaid      Policy#: [id number] p      Phone#:   Financial Counselor:       Phone#:   The "Data Collection Information Summary" for patients in Inpatient Rehabilitation Facilities with attached "Privacy Act [place] Records" was provided and verbally reviewed with: Patient  Emergency Contact Information Contact Information    Name Relation Home Work Mobile   [place] Sister   [phone]     Current Medical History  Patient Admitting Diagnosis: R posterior frontal lobe CVA  History of Present Illness: David Hendricks is a 70 year old right-handed male with history of tobacco abuse, COPD, CAD with stenting maintained on aspirin, hyperlipidemia, hyperthyroidism, BPH anxiety with depression.  Presented 02/15/2020 with left-sided weakness and slurred speech.  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  Small focus of susceptibility artifact in this region representing small thrombosed distal cortical MCA branch.  Possible additional punctate infarct in the right parietal cortex versus infarct.  No substantial edema or mass-effect.  CT angiogram of the head and neck nondominant right vertebral artery is diminutive throughout its course and likely occluded  intradurally otherwise no large vessel occlusion or proximal hemodynamically significant stenosis.  Admission chemistries unremarkable aside glucose 104 SARS current virus negative hemoglobin 18.3, TSH 0.919.  Urine drug screen negative.  Echocardiogram with ejection fraction of 60 to 65% no wall motion abnormalities grade 1 diastolic dysfunction.  Currently maintained on aspirin and Plavix for CVA prophylaxis x3 weeks then Plavix alone.  Awaiting plan for loop recorder.  Dysphagia #1 honey thick liquid diet.  Therapy evaluations completed due to patient's left-sided weakness and slurred speech was recommended for a comprehensive rehab program.  Complete NIHSS TOTAL: 3 Glasgow Coma Scale Score: 14  Past Medical History  Past Medical History:  Diagnosis Date  . Allergy   . Anxiety   . Aortic atherosclerosis ([place])   . Arthritis   . Benign prostatic hyperplasia (BPH) with urinary urgency   . Chronic kidney disease    KIDNEY STONES  . COPD (chronic obstructive pulmonary disease) ([place])    possible   . Coronary artery disease    10/18 PCI/DES to pLAD, mild nonobstructive disease in the Lcx/RCA. Normal EF.   Marland Kitchen Depression   . Dyslipidemia   . Dysrhythmia 2016   irregular  heartbeat  . Gallstones   . GERD (gastroesophageal reflux disease)   . History of kidney stones   . Hyperlipidemia   . Hyperthyroidism   . Leg cramps   . Multiple pulmonary nodules   . Myocardial infarction ([place])    2018  . Pneumonia   . Seasonal allergies   . Thyroid nodule   . Vertigo   . Vitamin B 12 deficiency     Family History  family history includes Alcohol abuse in his father; Cancer in his father.  Prior Rehab/Hospitalizations:  Has the patient had prior rehab or hospitalizations prior to admission? No  Has the patient had major surgery during 100 days prior to admission? Yes  Current Medications   Current Facility-Administered Medications:  .  [MAR Hold]  stroke: mapping our early stages of  recovery book, , Does not apply, Once, Nicoletta Dress, Na, MD .  [facility] Hold] acetaminophen (TYLENOL) tablet 650 mg, 650 mg, Oral, Q4H PRN **OR** [MAR Hold] acetaminophen (TYLENOL) 160 MG/5ML solution 650 mg, 650 mg, Per Tube, Q4H PRN **OR** [MAR Hold] acetaminophen (TYLENOL) suppository 650 mg, 650 mg, Rectal, Q4H PRN, Nicoletta Dress, Na, MD .  [facility] Hold] albuterol (VENTOLIN HFA) 108 (90 Base) MCG/ACT inhaler 1 puff, 1 puff, Inhalation, Q4H PRN, Nicoletta Dress, Na, MD .  [facility] Hold] aspirin EC tablet 81 mg, 81 mg, Oral, Daily, 81 mg at 02/20/20 0821 **OR** [DISCONTINUED] aspirin suppository 300 mg, 300 mg, Rectal, Daily, Rosalin Hawking, MD .  [facility] Hold] atorvastatin (LIPITOR) tablet 80 mg, 80 mg, Oral, Daily, Li, Na, MD, 80 mg at 02/20/20 0821 .  [MAR Hold] bisacodyl (DULCOLAX) suppository 10 mg, 10 mg, Rectal, Daily PRN, Nicoletta Dress, Na, MD .  [facility] Hold] chlorhexidine (PERIDEX) 0.12 % solution 15 mL, 15 mL, Mouth Rinse, BID, Rama, Venetia Maxon, MD, 15 mL at 02/20/20 0826 .  [MAR Hold] clopidogrel (PLAVIX) tablet 75 mg, 75 mg, Oral, Daily, Li, Na, MD, 75 mg at 02/20/20 5053 .  [facility] Hold] MEDLINE mouth rinse, 15 mL, Mouth Rinse, q12n4p, Rama, Venetia Maxon, MD, 15 mL at 02/20/20 1529 .  [MAR Hold] methimazole (TAPAZOLE) tablet 5 mg, 5 mg, Oral, Daily, Li, Na, MD, 5 mg at 02/20/20 9767 .  [MAR Hold] nicotine (NICODERM CQ - dosed in mg/24 hours) patch 21 mg, 21 mg, Transdermal, Daily, Rama, Venetia Maxon, MD, 21 mg at 02/20/20 0826 .  [MAR Hold] ondansetron (ZOFRAN) injection 4 mg, 4 mg, Intravenous, Q6H PRN, Vann, Jessica U, DO, 4 mg at 02/18/20 1837 .  [MAR Hold] pantoprazole (PROTONIX) EC tablet 40 mg, 40 mg, Oral, BID, Vann, Jessica U, DO, 40 mg at 02/20/20 3419 .  [facility] Hold] Resource ThickenUp Clear, , Oral, PRN, Eliseo Squires, Jessica U, DO .  [MAR Hold] senna-docusate (Senokot-S) tablet 1 tablet, 1 tablet, Oral, QHS PRN, Nicoletta Dress, Na, MD .  [facility] Hold] tamsulosin (FLOMAX) capsule 0.4 mg, 0.4 mg, Oral, Daily, Li, Na, MD, 0.4 mg at 02/20/20 3790 .  [MAR Hold]  traZODone (DESYREL) tablet 50 mg, 50 mg, Oral, QHS, Rama, Venetia Maxon, MD, 50 mg at 02/19/20 2123  Patients Current Diet:  Diet Order            DIET - DYS 1 Room service appropriate? No; Fluid consistency: Honey Thick  Diet effective now                 Precautions / Restrictions Precautions Precautions: Fall,Other (comment) Precaution Comments: L weakness and lean, impulsive Restrictions Weight Bearing Restrictions: No   Has the patient had  2 or more falls or a fall with injury in the past year?No  Prior Activity Level Limited Community (1-2x/wk): was driving PTA, getting groceries, caring for home, no DME used  Prior Functional Level Prior Function Level of Independence: Independent  Self Care: Did the patient need help bathing, dressing, using the toilet or eating?  Independent  Indoor Mobility: Did the patient need assistance with walking from room to room (with or without device)? Independent  Stairs: Did the patient need assistance with internal or external stairs (with or without device)? Independent  Functional Cognition: Did the patient need help planning regular tasks such as shopping or remembering to take medications? Independent  Home Assistive Devices / Equipment Home Assistive Devices/Equipment: None Home Equipment: None  Prior Device Use: Indicate devices/aids used by the patient prior to current illness, exacerbation or injury? None of the above  Current Functional Level Cognition  Arousal/Alertness: Awake/alert Overall Cognitive Status: Impaired/Different from baseline Current Attention Level: Sustained Orientation Level: Oriented to person,Oriented to place Following Commands: Follows one step commands with increased time,Follows one step commands consistently Safety/Judgement: Decreased awareness of safety,Decreased awareness of deficits General Comments: calm and cooperative but still with deficits in awareness of safety and overall deficits-  impulsive and requiring multiple multimodal cues to prevent him from standing and moving prematurely. Still with need for increased processing time/command following. Attention: Sustained Sustained Attention: Impaired Sustained Attention Impairment: Verbal basic Memory: Impaired Memory Impairment: Storage deficit,Retrieval deficit,Decreased recall of new information Awareness: Impaired Awareness Impairment: Intellectual impairment,Emergent impairment Safety/Judgment: Impaired    Extremity Assessment (includes Sensation/Coordination)  Upper Extremity Assessment: Generalized weakness RUE Deficits / Details: AROM, WFLs; MMT 4/5 MM grade, good grip RUE Sensation: WNL RUE Coordination: WNL LUE Deficits / Details: LUE AAROM intact; fair grip strength; pt requiring assist for arm movement to hold onto stedy, but able to grip to stand LUE Sensation: decreased light touch LUE Coordination: decreased fine motor,decreased gross motor  Lower Extremity Assessment: Generalized weakness LLE Deficits / Details: L sided weakness LLE Sensation: decreased light touch,WNL    ADLs  Overall ADL's : Needs assistance/impaired Eating/Feeding: NPO Grooming: Wash/dry face,Standing,Min guard,Brushing hair Grooming Details (indicate cue type and reason): pt able to stand in stedy to complete face washing and brushing hair with up to min guard assist for balance Upper Body Bathing: Maximal assistance,Sitting Lower Body Bathing: Total assistance,Cueing for safety,Sitting/lateral leans,Sit to/from stand Upper Body Dressing : Moderate assistance,Sitting Upper Body Dressing Details (indicate cue type and reason): donning gown Lower Body Dressing: Total assistance,Bed level Lower Body Dressing Details (indicate cue type and reason): to don socks Toilet Transfer: Min guard,Total assistance Toilet Transfer Details (indicate cue type and reason): minguard to sit<>stand to stedy, total A for the pivot in  stedy Toileting- Clothing Manipulation and Hygiene: Total assistance,Sitting/lateral lean,Sit to/from stand,Cueing for safety Toileting - Clothing Manipulation Details (indicate cue type and reason): condom catheter on Functional mobility during ADLs: Minimal assistance,Min guard,+2 for safety/equipment General ADL Comments: pt able to complete multiple sit<>stands in stedy with minguard assist +2 for safety, pt able to stand at sink with minguard to complete grooming tasks. pt continues to present with impaired LUE [place] tasks, and visual deficits    Mobility  Overal bed mobility: Needs Assistance Bed Mobility: Supine to Sit,Sit to Supine Supine to sit: Min assist,HOB elevated Sit to supine: Min guard General bed mobility comments: MinA and increased time to get to EOB with HOB elevated, cues to scoot forward so B feet were on the floor.  Lean much better today.    Transfers  Overall transfer level: Needs assistance Equipment used: Rolling walker (2 wheeled) Transfer via Lift Equipment: Stedy Transfers: Sit to/from [employer] to Stand: Hydrographic surveyor transfer comment: MInA to boost up to standing with RW, cues for hand placement and to get balance in standing before attempting gait    Ambulation / Gait / Stairs / Wheelchair Mobility  Ambulation/Gait Ambulation/Gait assistance: Herbalist (Feet): 70 Feet (77ft, 82ft) Assistive device: Rolling walker (2 wheeled) Gait Pattern/deviations: Step-through pattern,Trunk flexed,Trendelenburg General Gait Details: tends to push RW far ahead of him and required MinA for safety/control of device; moves very quickly and needed cues for slowing down to safe pace. SPO2 between 88-94% on room air with activity with one time drop to 86% but able to recover well with rest. Gait velocity: too fast to be considered safe given impairments    Posture / Balance Dynamic Sitting Balance Sitting balance - Comments: min guard for  safety Balance Overall balance assessment: Needs assistance Sitting-balance support: Feet supported,No upper extremity supported Sitting balance-Leahy Scale: Fair Sitting balance - Comments: min guard for safety Postural control: Posterior lean,Left lateral lean Standing balance support: Bilateral upper extremity supported,During functional activity Standing balance-Leahy Scale: Fair Standing balance comment: benefits from BUE support    Special needs/care consideration Skin loop incision, Diabetic management yes and Designated visitor Tyson Parkison (sister)     Previous Home Environment (from acute therapy documentation) Living Arrangements: Alone  Lives With: Alone Available Help at Discharge: Other (Comment) Type of Home: Apartment Home Layout: One level Home Access: Level entry Bathroom Shower/Tub: Optometrist: Yes Home Care Services: No Additional Comments: Info taken from PT eval  Discharge Living Setting Plans for Discharge Living Setting: Patient's home Type of Home at Discharge: Apartment Discharge Home Layout: One level Discharge Home Access: Level entry Discharge Bathroom Shower/Tub: Tub/shower unit Discharge Bathroom Toilet: Standard Discharge Bathroom Accessibility: Yes How Accessible: Accessible via walker Does the patient have any problems obtaining your medications?: No (pt will be going to SNF)  Social/Family/Support Systems Anticipated Caregiver: sister is primary contact for decisions/coordination of care Anticipated Caregiver's Contact Information: [name] [phone] Ability/Limitations of Caregiver: cannot provide assist as she [place] out of [place] Discharge Plan Discussed with Primary Caregiver: Yes Is Caregiver In Agreement with Plan?: Yes Does Caregiver/Family have Issues with Lodging/Transportation while Pt is in Rehab?: No   Goals Patient/Family Goal for Rehab: PT/OT/SLP intermittent mod  I Expected length of stay: 16-18 days Additional Information: discussed goal updates with Dr. Naaman Plummer Pt/Family Agrees to Admission and willing to participate: Yes Program Orientation Provided & Reviewed with Pt/Caregiver Including Roles  & Responsibilities: Yes  Barriers to Discharge: Lack of/limited family support   Decrease burden of Care through IP rehab admission: n/a  Possible need for SNF placement upon discharge: Potentially.  If pt does not reach a level where he can safely discharge home with intermittent mod I will need to pursue SNF.   Patient Condition: This patient's medical and functional status has changed since the consult dated: 02/19/2020 in which the Rehabilitation Physician determined and documented that the patient's condition is appropriate for intensive rehabilitative care in an inpatient rehabilitation facility. See "History of Present Illness" (above) for medical update. Functional changes are: pt is min assist with mobility. Patient's medical and functional status update has been discussed with the Rehabilitation physician and patient remains appropriate for inpatient rehabilitation. Will admit to inpatient rehab today.  Preadmission Screen Completed By:  Michel Santee, PT, 02/20/2020 4:28 PM ______________________________________________________________________   Discussed status with Dr. Dagoberto Ligas on 02/24/20 at  9:50 AM  and received approval for admission today.  Admission Coordinator:  Michel Santee, PT, DPT time 9:50 AM Sudie Grumbling 02/24/20

## 2020-02-20 NOTE — Consult Note (Signed)
ELECTROPHYSIOLOGY CONSULT NOTE  Patient ID: David Hendricks MRN: [id number], DOB/AGE: Aug 22, 1950   Admit date: 02/15/2020 Date of Consult: 02/20/2020  Primary Physician: Horald Pollen, MD Primary Cardiologist: No primary care provider on file.  Primary Electrophysiologist: New to Dr. Quentin Ore Reason for Consultation: Cryptogenic stroke; recommendations regarding Implantable Loop Recorder Insurance: Medicare A & B  History of Present Illness David Hendricks is a 70 y.o. male with h/o HTN, CAD, tobacco abuse, and HLD.   EP has been asked to evaluate [name] for placement of an implantable loop recorder to monitor for atrial fibrillation by Dr Erlinda Hong.  The patient was admitted on 02/15/2020 with slurred speech, Rt facial droop, and left arm weakness.     Imaging demonstrated acute right MCA cortical and subcortical infarcts in an embolic pattern.    He has undergone workup for stroke including:   CT Code Stroke- No evidence of acute large vascular territory infarct.   CTA head & neck- The nondominant right vertebral artery is diminutive throughout its course and likely occluded intradurally. Diminutive vertebrobasilar system intracranially with bilateral fetal type PCAs, anatomic variant.   MRI -with 6 today is am not sure dilatation to the same thing cortical and subcortical infarct in the high right posterior frontal lobe. Small focus of susceptibility artifact in this region may represent a small thrombosed distal cortical MCA Branch. Possible additional punctate infarct in the right parietal cortex versus artifact.  2D Echo -  LVEF 60-65%   Doppler of lower extremities - No DVT  The patient has been monitored on telemetry which has demonstrated sinus rhythm with short SVT but no AF/AFL.  Inpatient stroke work-up will not require a TEE per Neurology.   Echocardiogram as above. Lab work is reviewed.  Initially patient significant altered mental status post stroke require soft restraints  and goals of care discussions. He has now progressed well, mentating well and pleasantly interactive. Plans for CIR, likely tomorrow.   Prior to admission, the patient denies recent chest pain, shortness of breath, dizziness, palpitations, or syncope.  He is recovering from his stroke with plans to attend CIR  at discharge.  Past Medical History:  Diagnosis Date  . Allergy   . Anxiety   . Aortic atherosclerosis ([place])   . Arthritis   . Benign prostatic hyperplasia (BPH) with urinary urgency   . Chronic kidney disease    KIDNEY STONES  . COPD (chronic obstructive pulmonary disease) ([place])    possible   . Coronary artery disease    10/18 PCI/DES to pLAD, mild nonobstructive disease in the Lcx/RCA. Normal EF.   Marland Kitchen Depression   . Dyslipidemia   . Dysrhythmia 2016   irregular heartbeat  . Gallstones   . GERD (gastroesophageal reflux disease)   . History of kidney stones   . Hyperlipidemia   . Hyperthyroidism   . Leg cramps   . Multiple pulmonary nodules   . Myocardial infarction ([place])    2018  . Pneumonia   . Seasonal allergies   . Thyroid nodule   . Vertigo   . Vitamin B 12 deficiency      Surgical History:  Past Surgical History:  Procedure Laterality Date  . COLONOSCOPY    . CORONARY ANGIOPLASTY  10/09/2016  . CORONARY STENT INTERVENTION N/A 10/09/2016   Procedure: CORONARY STENT INTERVENTION;  Surgeon: Nelva Bush, MD;  Location: [facility];  Service: Cardiovascular;  Laterality: N/A;  . CYSTOSCOPY WITH RETROGRADE PYELOGRAM, URETEROSCOPY AND STENT PLACEMENT Left  06/17/2019   Procedure: CYSTOSCOPY WITH RETROGRADE PYELOGRAM, URETEROSCOPY AND STENT PLACEMENT;  Surgeon: Robley Fries, MD;  [facility];  Service: Urology;  Laterality: Left;  1 HR  . HOLMIUM LASER APPLICATION Left 02/13/9561   Procedure: HOLMIUM LASER APPLICATION;  Surgeon: Robley Fries, MD;  [facility];  Service: Urology;  Laterality: Left;  . INTRAVASCULAR ULTRASOUND/IVUS N/A  10/09/2016   Procedure: Intravascular Ultrasound/IVUS;  Surgeon: Nelva Bush, MD;  Location: [facility];  Service: Cardiovascular;  Laterality: N/A;  . IR CHOLANGIOGRAM EXISTING TUBE  11/18/2018  . IR PERC CHOLECYSTOSTOMY  11/16/2018  . LEFT HEART CATH AND CORONARY ANGIOGRAPHY N/A 10/09/2016   Procedure: LEFT HEART CATH AND CORONARY ANGIOGRAPHY;  Surgeon: Nelva Bush, MD;  Location: [facility];  Service: Cardiovascular;  Laterality: N/A;     Facility-Administered Medications Prior to Admission  Medication Dose Route Frequency Provider Last Rate Last Admin  . cyanocobalamin ((VITAMIN B-12)) injection 1,000 mcg  1,000 mcg Intramuscular Q30 days Horald Pollen, MD   1,000 mcg at 08/29/19 1525   Medications Prior to Admission  Medication Sig Dispense Refill Last Dose  . albuterol (VENTOLIN HFA) 108 (90 Base) MCG/ACT inhaler INHALE 2 PUFFS INTO THE LUNGS EVERY 6 HOURS AS NEEDED (Patient taking differently: Inhale 2 puffs into the lungs every 6 (six) hours as needed for wheezing or shortness of breath.) 18 g 5 unk  . aspirin EC 81 MG tablet Take 81 mg by mouth daily.   02/14/2020 at Unknown time  . atorvastatin (LIPITOR) 80 MG tablet Take 1 tablet (80 mg total) by mouth daily. 90 tablet 3 02/14/2020 at Unknown time  . cyclobenzaprine (FLEXERIL) 5 MG tablet Take 5 mg by mouth 3 (three) times daily as needed for muscle spasms.   02/14/2020 at Unknown time  . gabapentin (NEURONTIN) 100 MG capsule Take 100 mg by mouth 3 (three) times daily as needed (pain).   Past Week at Unknown time  . methimazole (TAPAZOLE) 5 MG tablet TAKE 1 TABLET(5 MG) BY MOUTH DAILY (Patient taking differently: Take 5 mg by mouth daily.) 90 tablet 1 02/14/2020 at Unknown time  . naproxen sodium (ALEVE) 220 MG tablet Take 220 mg by mouth daily as needed (mild pain).   unk  . ondansetron (ZOFRAN) 4 MG tablet Take 1 tablet (4 mg total) by mouth every 6 (six) hours as needed for nausea or vomiting. 20 tablet 0 unk   . OVER THE COUNTER MEDICATION Take 1 tablet by mouth daily as needed (nasal congestion). [employer] brand   02/14/2020 at Unknown time  . oxyCODONE-acetaminophen (PERCOCET) 5-325 MG tablet Take 1 tablet by mouth every 4 (four) hours as needed. (Patient taking differently: Take 1-1.5 tablets by mouth every 4 (four) hours as needed for moderate pain.) 20 tablet 0 UNK at UNK  . pantoprazole (PROTONIX) 40 MG tablet TAKE 1 TABLET BY MOUTH EVERY DAY (Patient taking differently: Take 40 mg by mouth daily as needed (heartburn).) 30 tablet 0 Past Week at Unknown time  . tamsulosin (FLOMAX) 0.4 MG CAPS capsule Take 0.4 mg by mouth daily.    02/14/2020 at Unknown time  . traMADol (ULTRAM) 50 MG tablet Take 50 mg by mouth every 6 (six) hours as needed for moderate pain.   Past Month at Unknown time  . traZODone (DESYREL) 50 MG tablet Take 50 mg by mouth at bedtime.   02/14/2020 at Unknown time  . budesonide-formoterol (SYMBICORT) 80-4.5 MCG/ACT inhaler Inhale 2 puffs into the  lungs 2 (two) times daily. (Patient not taking: Reported on 02/15/2020) 1 Inhaler 3 Not Taking at Unknown time    Inpatient Medications:  .  stroke: mapping our early stages of recovery book   Does not apply Once  . aspirin EC  81 mg Oral Daily  . atorvastatin  80 mg Oral Daily  . chlorhexidine  15 mL Mouth Rinse BID  . clopidogrel  75 mg Oral Daily  . mouth rinse  15 mL Mouth Rinse q12n4p  . methimazole  5 mg Oral Daily  . nicotine  21 mg Transdermal Daily  . pantoprazole  40 mg Oral BID  . tamsulosin  0.4 mg Oral Daily  . traZODone  50 mg Oral QHS    Allergies: No Known Allergies  Social History   Socioeconomic History  . Marital status: Divorced    Spouse name: Not on file  . Number of children: 0  . Years of education: Not on file  . Highest education level: Not on file  Occupational History  . Occupation: retired Mudlogger  Tobacco Use  . Smoking status: Current Every Day Smoker    Packs/day: 2.00    Years:  45.00    Pack years: 90.00    Types: Cigarettes    Start date: 01/09/1961  . Smokeless tobacco: Never Used  . Tobacco comment: tobacco info given  Vaping Use  . Vaping Use: Never used  Substance and Sexual Activity  . Alcohol use: No  . Drug use: No  . Sexual activity: Not on file  Other Topics Concern  . Not on file  Social History Narrative  . Not on file   Social Determinants of Health   Financial Resource Strain: Not on file  Food Insecurity: Not on file  Transportation Needs: Not on file  Physical Activity: Not on file  Stress: Not on file  Social Connections: Not on file  Intimate Partner Violence: Not on file     Family History  Problem Relation Age of Onset  . Alcohol abuse Father        called [place] per pt  . Cancer Father        OAK CELL CANCER PER PT NON SURGICAL   . Colon cancer Neg Hx   . Colon polyps Neg Hx   . Esophageal cancer Neg Hx   . Rectal cancer Neg Hx   . Stomach cancer Neg Hx       Review of Systems: All other systems reviewed and are otherwise negative except as noted above.  Physical Exam: Vitals:   02/20/20 0206 02/20/20 0542 02/20/20 0846 02/20/20 1200  BP: 115/67 114/64 115/63 113/68  Pulse: 74 72 79 79  Resp: 18 18 18 18   Temp: 98 F (36.7 C) 98 F (36.7 C) 98 F (36.7 C) 98.2 F (36.8 C)  TempSrc: Oral Oral Oral Oral  SpO2: 94% 93% 90% 96%  Weight:      Height:        GEN- The patient is well appearing, alert and oriented x 3 today.   Head- normocephalic, atraumatic Eyes-  Sclera clear, conjunctiva pink Ears- hearing intact Oropharynx- clear Neck- supple Lungs- Clear to ausculation bilaterally, normal work of breathing Heart- Regular rate and rhythm, no murmurs, rubs or gallops  GI- soft, NT, ND, + BS Extremities- no clubbing, cyanosis, or edema MS- no significant deformity or atrophy Skin- no rash or lesion Psych- euthymic mood, full affect   Labs:   Lab Results  Component Value Date   WBC 9.8  02/19/2020   HGB 16.4 02/19/2020   HCT 49.3 02/19/2020   MCV 86.5 02/19/2020   PLT 160 02/19/2020    Recent Labs  Lab 02/16/20 0500 02/19/20 0218  NA 137 137  K 3.7 3.6  CL 101 102  CO2 24 23  BUN 10 11  CREATININE 0.98 0.85  CALCIUM 8.8* 8.4*  PROT 6.3*  --   BILITOT 1.5*  --   ALKPHOS 95  --   ALT 17  --   AST 24  --   GLUCOSE 92 83     Radiology/Studies: MR BRAIN WO CONTRAST  Result Date: 02/15/2020 CLINICAL DATA:  Stroke follow-up. EXAM: MRI HEAD WITHOUT CONTRAST TECHNIQUE: Multiplanar, multiecho pulse sequences of the brain and surrounding structures were obtained without intravenous contrast. COMPARISON:  None. FINDINGS: Brain: Acute cortical and subcortical infarct in the high right posterior frontal lobe no substantial edema or mass effect. Small focus of susceptibility artifact in this region may represent a small thrombosed distal cortical MCA branch (see series 18, image 40). There is an additional possible punctate focus of restricted diffusion in the right parietal cortex (see series 9, image 93 and series 10, image 39). Moderate to advanced additional scattered T2/FLAIR hyperintensities within the white matter, most likely related to chronic microvascular ischemic disease. No hydrocephalus. No acute hemorrhage. No extra-axial fluid collection. No abnormal mass lesion or abnormal mass effect. No midline shift. Basal cisterns are patent. Additional small focus of susceptibility artifact in the right parietal lobe may represent the sequela of prior microhemorrhage. Vascular: Major arterial flow voids are maintained at the skull base. Skull and upper cervical spine: Normal marrow signal. Sinuses/Orbits: Largely clear. Other: No mastoid effusions. IMPRESSION: 1. Acute cortical and subcortical infarct in the high right posterior frontal lobe. Small focus of susceptibility artifact in this region may represent a small thrombosed distal cortical MCA branch. 2. Possible additional  punctate infarct in the right parietal cortex versus artifact. 3. No substantial edema or mass effect. 4. Moderate to advanced chronic microvascular ischemic disease. These results will be called to the ordering clinician or representative by the Radiologist Assistant, and communication documented in the PACS or [employer]. Electronically Signed   By: Margaretha Sheffield MD   On: 02/15/2020 18:44   DG Chest Port 1 View  Result Date: 02/15/2020 CLINICAL DATA:  Cough. EXAM: PORTABLE CHEST 1 VIEW COMPARISON:  April 23, 2019. FINDINGS: The heart size and mediastinal contours are within normal limits. Both lungs are clear. The visualized skeletal structures are unremarkable. IMPRESSION: No active disease. Electronically Signed   By: Marijo Conception M.D.   On: 02/15/2020 15:43   DG Swallowing Func-Speech Pathology  Result Date: 02/18/2020 Objective Swallowing Evaluation: Type of Study: MBS-Modified Barium Swallow Study  Patient Details Name: Zylon Creamer MRN: [id number] Date of Birth: [birth year] Today's Date: 02/18/2020 Time: SLP Start Time (ACUTE ONLY): 1409 -SLP Stop Time (ACUTE ONLY): 1427 SLP Time Calculation (min) (ACUTE ONLY): 18 min Past Medical History: Past Medical History: Diagnosis Date . Allergy  . Anxiety  . Aortic atherosclerosis ([place])  . Arthritis  . Benign prostatic hyperplasia (BPH) with urinary urgency  . Chronic kidney disease   KIDNEY STONES . COPD (chronic obstructive pulmonary disease) ([place])   possible  . Coronary artery disease   10/18 PCI/DES to pLAD, mild nonobstructive disease in the Lcx/RCA. Normal EF.  Marland Kitchen Depression  . Dyslipidemia  . Dysrhythmia 2016  irregular heartbeat . Gallstones  .  GERD (gastroesophageal reflux disease)  . History of kidney stones  . Hyperlipidemia  . Hyperthyroidism  . Leg cramps  . Multiple pulmonary nodules  . Myocardial infarction ([place])   2018 . Pneumonia  . Seasonal allergies  . Thyroid nodule  . Vertigo  . Vitamin B 12 deficiency  Past Surgical History: Past  Surgical History: Procedure Laterality Date . COLONOSCOPY   . CORONARY ANGIOPLASTY  10/09/2016 . CORONARY STENT INTERVENTION N/A 10/09/2016  Procedure: CORONARY STENT INTERVENTION;  Surgeon: Nelva Bush, MD;  Location: [facility];  Service: Cardiovascular;  Laterality: N/A; . CYSTOSCOPY WITH RETROGRADE PYELOGRAM, URETEROSCOPY AND STENT PLACEMENT Left 06/17/2019  Procedure: CYSTOSCOPY WITH RETROGRADE PYELOGRAM, URETEROSCOPY AND STENT PLACEMENT;  Surgeon: Robley Fries, MD;  [facility];  Service: Urology;  Laterality: Left;  1 HR . HOLMIUM LASER APPLICATION Left 4/0/0867  Procedure: HOLMIUM LASER APPLICATION;  Surgeon: Robley Fries, MD;  [facility];  Service: Urology;  Laterality: Left; . INTRAVASCULAR ULTRASOUND/IVUS N/A 10/09/2016  Procedure: Intravascular Ultrasound/IVUS;  Surgeon: Nelva Bush, MD;  Location: [facility];  Service: Cardiovascular;  Laterality: N/A; . IR CHOLANGIOGRAM EXISTING TUBE  11/18/2018 . IR PERC CHOLECYSTOSTOMY  11/16/2018 . LEFT HEART CATH AND CORONARY ANGIOGRAPHY N/A 10/09/2016  Procedure: LEFT HEART CATH AND CORONARY ANGIOGRAPHY;  Surgeon: Nelva Bush, MD;  Location: [facility];  Service: Cardiovascular;  Laterality: N/A; HPI: Pt is a 70 y/o male admitted secondary to slurred speech and L sided weakness. 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 PMH includes tobacco use, CAD, COPD, anxiety, and depression.  Subjective: alert, cooperative, dysarthric Assessment / Plan / Recommendation CHL IP CLINICAL IMPRESSIONS 02/18/2020 Clinical Impression Although pt presents with similar areas of weakness compared to yesterday's MBS, his improved mentation has a positive impact on his performance and ability to incorporate swallowing strategies. Head turns do not have a significant effect, but he has mildly improved vallecular clearance with use of a chin tuck and hard swallows. Residue is still significant, but he demonstrated some awareness  when he spontaneously brought most of the residue back into his mouth to swallow it again, clearing more of it on the second try. On subsequent swallows, pt could be cued to do the same. Penetration and trace aspiration occurs with nectar thick liquids that still spill into the airway before he swallows, but no aspiration is observed with honey thick liquids or purees. There is trace amounts of penetration that is not always fully cleared, and when also considering the amount of vallecular residue he has, he would still remain at risk for some aspiration across the duration of a meal. Discussed with pt and MD, who is in agreement with starting modified diet with use of precautions to reduce but not eliminate aspiration risk while palliative care consult is pending. Therefore, will start Dys 1 diet and honey thick liquids with the following  precautions: chin tuck, alternate solids/liquids, hard swallows, cue to bring food back into his mouth and swallow again.  SLP Visit Diagnosis Dysphagia, oropharyngeal phase (R13.12) Attention and concentration deficit following -- Frontal lobe and executive function deficit following -- Impact on safety and function Moderate aspiration risk   CHL IP TREATMENT RECOMMENDATION 02/18/2020 Treatment Recommendations Therapy as outlined in treatment plan below   Prognosis 02/18/2020 Prognosis for Safe Diet Advancement Good Barriers to Reach Goals Cognitive deficits;Severity of deficits Barriers/Prognosis Comment -- CHL IP DIET RECOMMENDATION 02/18/2020 SLP Diet Recommendations Dysphagia 1 (Puree) solids;Honey thick liquids Liquid Administration via  Cup Medication Administration Crushed with puree Compensations Minimize environmental distractions;Slow rate;Follow solids with liquid;Chin tuck;Other (Comment) Postural Changes Seated upright at 90 degrees;Remain semi-upright after after feeds/meals (Comment)   CHL IP OTHER RECOMMENDATIONS 02/18/2020 Recommended Consults -- Oral Care  Recommendations Oral care QID Other Recommendations Have oral suction available;Order thickener from pharmacy;Prohibited food (jello, ice cream, thin soups);Remove water pitcher   CHL IP FOLLOW UP RECOMMENDATIONS 02/18/2020 Follow up Recommendations Inpatient Rehab   CHL IP FREQUENCY AND DURATION 02/18/2020 Speech Therapy Frequency (ACUTE ONLY) min 2x/week Treatment Duration 2 weeks      CHL IP ORAL PHASE 02/18/2020 Oral Phase Impaired Oral - Pudding Teaspoon -- Oral - Pudding Cup -- Oral - Honey Teaspoon -- Oral - Honey Cup Reduced posterior propulsion;Decreased bolus cohesion;Lingual/palatal residue;Weak lingual manipulation Oral - Nectar Teaspoon Reduced posterior propulsion;Decreased bolus cohesion;Lingual/palatal residue;Weak lingual manipulation Oral - Nectar Cup -- Oral - Nectar Straw -- Oral - Thin Teaspoon -- Oral - Thin Cup -- Oral - Thin Straw -- Oral - Puree Reduced posterior propulsion;Decreased bolus cohesion;Lingual/palatal residue;Weak lingual manipulation Oral - Mech Soft -- Oral - Regular -- Oral - Multi-Consistency -- Oral - Pill -- Oral Phase - Comment --  CHL IP PHARYNGEAL PHASE 02/18/2020 Pharyngeal Phase Impaired Pharyngeal- Pudding Teaspoon -- Pharyngeal -- Pharyngeal- Pudding Cup -- Pharyngeal -- Pharyngeal- Honey Teaspoon -- Pharyngeal -- Pharyngeal- Honey Cup Reduced pharyngeal peristalsis;Reduced epiglottic inversion;Reduced anterior laryngeal mobility;Reduced laryngeal elevation;Reduced tongue base retraction;Pharyngeal residue - valleculae;Penetration/Aspiration before swallow Pharyngeal Material enters airway, remains ABOVE vocal cords and not ejected out Pharyngeal- Nectar Teaspoon Reduced pharyngeal peristalsis;Reduced epiglottic inversion;Reduced anterior laryngeal mobility;Reduced laryngeal elevation;Reduced tongue base retraction;Pharyngeal residue - valleculae;Penetration/Aspiration before swallow Pharyngeal Material enters airway, passes BELOW cords without attempt by patient to eject  out (silent aspiration) Pharyngeal- Nectar Cup -- Pharyngeal -- Pharyngeal- Nectar Straw -- Pharyngeal -- Pharyngeal- Thin Teaspoon -- Pharyngeal -- Pharyngeal- Thin Cup -- Pharyngeal -- Pharyngeal- Thin Straw -- Pharyngeal -- Pharyngeal- Puree Reduced pharyngeal peristalsis;Reduced epiglottic inversion;Reduced anterior laryngeal mobility;Reduced laryngeal elevation;Reduced tongue base retraction;Pharyngeal residue - valleculae;Penetration/Apiration after swallow Pharyngeal Material enters airway, remains ABOVE vocal cords then ejected out Pharyngeal- Mechanical Soft -- Pharyngeal -- Pharyngeal- Regular -- Pharyngeal -- Pharyngeal- Multi-consistency -- Pharyngeal -- Pharyngeal- Pill -- Pharyngeal -- Pharyngeal Comment --  CHL IP CERVICAL ESOPHAGEAL PHASE 02/18/2020 Cervical Esophageal Phase WFL Pudding Teaspoon -- Pudding Cup -- Honey Teaspoon -- Honey Cup -- Nectar Teaspoon -- Nectar Cup -- Nectar Straw -- Thin Teaspoon -- Thin Cup -- Thin Straw -- Puree -- Mechanical Soft -- Regular -- Multi-consistency -- Pill -- Cervical Esophageal Comment -- Osie Bond., M.A. CCC-SLP Acute Rehabilitation Services Pager [phone] Office [phone] 02/18/2020, 4:35 PM              DG Swallowing Func-Speech Pathology  Result Date: 02/17/2020 Objective Swallowing Evaluation: Type of Study: MBS-Modified Barium Swallow Study  Patient Details Name: Laura Radilla MRN: [id number] Date of Birth: 1950/02/04 Today's Date: 02/17/2020 Time: SLP Start Time (ACUTE ONLY): 1335 -SLP Stop Time (ACUTE ONLY): 1353 SLP Time Calculation (min) (ACUTE ONLY): 18 min Past Medical History: Past Medical History: Diagnosis Date . Allergy  . Anxiety  . Aortic atherosclerosis ([place])  . Arthritis  . Benign prostatic hyperplasia (BPH) with urinary urgency  . Chronic kidney disease   KIDNEY STONES . COPD (chronic obstructive pulmonary disease) ([place])   possible  . Coronary artery disease   10/18 PCI/DES to pLAD, mild nonobstructive disease in the Lcx/RCA. Normal EF.   Marland Kitchen Depression  . Dyslipidemia  .  Dysrhythmia 2016  irregular heartbeat . Gallstones  . GERD (gastroesophageal reflux disease)  . History of kidney stones  . Hyperlipidemia  . Hyperthyroidism  . Leg cramps  . Multiple pulmonary nodules  . Myocardial infarction ([place])   2018 . Pneumonia  . Seasonal allergies  . Thyroid nodule  . Vertigo  . Vitamin B 12 deficiency  Past Surgical History: Past Surgical History: Procedure Laterality Date . COLONOSCOPY   . CORONARY ANGIOPLASTY  10/09/2016 . CORONARY STENT INTERVENTION N/A 10/09/2016  Procedure: CORONARY STENT INTERVENTION;  Surgeon: Nelva Bush, MD;  Location: [facility];  Service: Cardiovascular;  Laterality: N/A; . CYSTOSCOPY WITH RETROGRADE PYELOGRAM, URETEROSCOPY AND STENT PLACEMENT Left 06/17/2019  Procedure: CYSTOSCOPY WITH RETROGRADE PYELOGRAM, URETEROSCOPY AND STENT PLACEMENT;  Surgeon: Robley Fries, MD;  [facility];  Service: Urology;  Laterality: Left;  1 HR . HOLMIUM LASER APPLICATION Left 4/0/9735  Procedure: HOLMIUM LASER APPLICATION;  Surgeon: Robley Fries, MD;  [facility];  Service: Urology;  Laterality: Left; . INTRAVASCULAR ULTRASOUND/IVUS N/A 10/09/2016  Procedure: Intravascular Ultrasound/IVUS;  Surgeon: Nelva Bush, MD;  Location: [facility];  Service: Cardiovascular;  Laterality: N/A; . IR CHOLANGIOGRAM EXISTING TUBE  11/18/2018 . IR PERC CHOLECYSTOSTOMY  11/16/2018 . LEFT HEART CATH AND CORONARY ANGIOGRAPHY N/A 10/09/2016  Procedure: LEFT HEART CATH AND CORONARY ANGIOGRAPHY;  Surgeon: Nelva Bush, MD;  Location: [facility];  Service: Cardiovascular;  Laterality: N/A; HPI: Pt is a 70 y/o male admitted secondary to slurred speech and L sided weakness. 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 PMH includes tobacco use, CAD, COPD, anxiety, and depression.  Subjective: pt lethargic but following commands Assessment / Plan / Recommendation CHL IP CLINICAL IMPRESSIONS 02/17/2020 Clinical Impression Pt  presents with a significant oropharyngeal dysphagia that is also exacerbated by current level of alertness. He is quite lethargic throughout testing, although alert enough to follow commands and accept boluses consistently. Limited trials were given in light of severity of dysphagia and mentation. He has limited lingual control of boluses, allowing a spoonful of nectar thick liquids to spill immediately back into his laryngeal vestibule. Once it is there, he tries to achieve some laryngeal closure, which is successful at expelling some of the bolus back out of his airway, but he does aspirate some of it. Pt also has very limited base of tongue retraction, hyolaryngeal movement, and pharyngeal squeeze, so that almost an entire bolus of puree remains in his valleculae post-swallow. He cannot clear this on his own despite cues, with SLP also providing suction via yankauer to clear as much as possible. As this pureed residue seems to thin out by mixing with secretions, he begins to aspirate this as well. Recommend that pt remain NPO for now with consideration of temporary, alternative means of nutrition with plans to retest swallowing function when more alert. MD in agreement with keeping pt NPO and also plans to involve palliative care.  SLP Visit Diagnosis Dysphagia, oropharyngeal phase (R13.12) Attention and concentration deficit following -- Frontal lobe and executive function deficit following -- Impact on safety and function Severe aspiration risk   [facility] IP TREATMENT RECOMMENDATION 02/17/2020 Treatment Recommendations Therapy as outlined in treatment plan below   Prognosis 02/17/2020 Prognosis for Safe Diet Advancement Fair Barriers to Reach Goals Cognitive deficits;Severity of deficits Barriers/Prognosis Comment -- [facility] IP DIET RECOMMENDATION 02/17/2020 SLP Diet Recommendations NPO;Alternative means - temporary Liquid Administration via -- Medication Administration Via alternative means Compensations -- Postural Changes  --  [facility] IP OTHER RECOMMENDATIONS 02/17/2020 Recommended Consults -- Oral Care Recommendations Oral care QID Other Recommendations Have oral suction available   [facility] IP FOLLOW UP RECOMMENDATIONS 02/17/2020 Follow up Recommendations Inpatient Rehab   [facility] IP FREQUENCY AND DURATION 02/17/2020 Speech Therapy Frequency (ACUTE ONLY) min 2x/week Treatment Duration 2 weeks      [facility] IP ORAL PHASE 02/17/2020 Oral Phase Impaired Oral - Pudding Teaspoon -- Oral - Pudding Cup -- Oral - Honey Teaspoon -- Oral - Honey Cup -- Oral - Nectar Teaspoon Decreased bolus cohesion;Weak lingual manipulation Oral - Nectar Cup -- Oral - Nectar Straw -- Oral - Thin Teaspoon -- Oral - Thin Cup -- Oral - Thin Straw -- Oral - Puree Lingual/palatal residue;Weak lingual manipulation Oral - Mech Soft -- Oral - Regular -- Oral - Multi-Consistency -- Oral - Pill -- Oral Phase - Comment --  [facility] IP PHARYNGEAL PHASE 02/17/2020 Pharyngeal Phase Impaired Pharyngeal- Pudding Teaspoon -- Pharyngeal -- Pharyngeal- Pudding Cup -- Pharyngeal -- Pharyngeal- Honey Teaspoon -- Pharyngeal -- Pharyngeal- Honey Cup -- Pharyngeal -- Pharyngeal- Nectar Teaspoon Reduced pharyngeal peristalsis;Reduced epiglottic inversion;Reduced anterior laryngeal mobility;Reduced laryngeal elevation;Reduced tongue base retraction;Pharyngeal residue - valleculae;Penetration/Aspiration before swallow Pharyngeal Material enters airway, passes BELOW cords without attempt by patient to eject out (silent aspiration) Pharyngeal- Nectar Cup -- Pharyngeal -- Pharyngeal- Nectar Straw -- Pharyngeal -- Pharyngeal- Thin Teaspoon -- Pharyngeal -- Pharyngeal- Thin Cup -- Pharyngeal -- Pharyngeal- Thin Straw -- Pharyngeal -- Pharyngeal- Puree Reduced pharyngeal peristalsis;Reduced epiglottic inversion;Reduced anterior laryngeal mobility;Reduced laryngeal elevation;Reduced tongue base retraction;Pharyngeal residue - valleculae;Penetration/Apiration after swallow Pharyngeal Material enters airway, passes BELOW  cords without attempt by patient to eject out (silent aspiration) Pharyngeal- Mechanical Soft -- Pharyngeal -- Pharyngeal- Regular -- Pharyngeal -- Pharyngeal- Multi-consistency -- Pharyngeal -- Pharyngeal- Pill -- Pharyngeal -- Pharyngeal Comment --  [facility] IP CERVICAL ESOPHAGEAL PHASE 02/17/2020 Cervical Esophageal Phase (No Data) Pudding Teaspoon -- Pudding Cup -- Honey Teaspoon -- Honey Cup -- Nectar Teaspoon -- Nectar Cup -- Nectar Straw -- Thin Teaspoon -- Thin Cup -- Thin Straw -- Puree -- Mechanical Soft -- Regular -- Multi-consistency -- Pill -- Cervical Esophageal Comment -- Osie Bond., M.A. [place] Pager [phone] Office [phone] 02/17/2020, 3:25 PM              ECHOCARDIOGRAM COMPLETE  Result Date: 02/16/2020    ECHOCARDIOGRAM REPORT   Patient Name:   LEALAND ELTING Date of Exam: 02/16/2020 Medical Rec #:  [id number]  Height:       73.0 in Accession #:    [id number] Weight:       220.0 lb Date of Birth:  [birth year]  BSA:          2.241 m Patient Age:    75 years   BP:           116/57 mmHg Patient Gender: M          HR:           60 bpm. Exam Location:  Inpatient Procedure: 2D Echo, Cardiac Doppler and Color Doppler Indications:    Stroke  History:        Patient has prior history of Echocardiogram examinations, most                 recent 01/24/2017. Previous Myocardial Infarction and CAD,                 Stroke, Signs/Symptoms:Chest Pain; Risk Factors:Current Smoker.  Sonographer:    Roseanna Rainbow RDCS Referring Phys:  [id number] LI  Sonographer Comments: Technically difficult study due to poor echo windows. IMPRESSIONS  1. Left ventricular ejection fraction, by estimation, is 60 to 65%. The left ventricle has normal function. The left ventricle has no regional wall motion abnormalities. There is mild left ventricular hypertrophy. Left ventricular diastolic parameters are consistent with Grade I diastolic dysfunction (impaired relaxation).  2. Right ventricular systolic function is  mildly reduced. The right ventricular size is mildly enlarged. Tricuspid regurgitation signal is inadequate for assessing PA pressure.  3. The mitral valve is normal in structure. Trivial mitral valve regurgitation. No evidence of mitral stenosis.  4. The aortic valve is tricuspid. Aortic valve regurgitation is not visualized. No aortic stenosis is present.  5. Aortic dilatation noted. There is mild dilatation of the ascending aorta, measuring 40 mm.  6. The inferior vena cava is normal in size with greater than 50% respiratory variability, suggesting right atrial pressure of 3 mmHg. FINDINGS  Left Ventricle: Left ventricular ejection fraction, by estimation, is 60 to 65%. The left ventricle has normal function. The left ventricle has no regional wall motion abnormalities. The left ventricular internal cavity size was normal in size. There is  mild left ventricular hypertrophy. Left ventricular diastolic parameters are consistent with Grade I diastolic dysfunction (impaired relaxation). Right Ventricle: The right ventricular size is mildly enlarged. No increase in right ventricular wall thickness. Right ventricular systolic function is mildly reduced. Tricuspid regurgitation signal is inadequate for assessing PA pressure. Left Atrium: Left atrial size was normal in size. Right Atrium: Right atrial size was normal in size. Pericardium: Trivial pericardial effusion is present. Mitral Valve: The mitral valve is normal in structure. Trivial mitral valve regurgitation. No evidence of mitral valve stenosis. Tricuspid Valve: The tricuspid valve is normal in structure. Tricuspid valve regurgitation is not demonstrated. Aortic Valve: The aortic valve is tricuspid. Aortic valve regurgitation is not visualized. No aortic stenosis is present. Pulmonic Valve: The pulmonic valve was normal in structure. Pulmonic valve regurgitation is not visualized. Aorta: Aortic dilatation noted. There is mild dilatation of the ascending aorta,  measuring 40 mm. Venous: The inferior vena cava is normal in size with greater than 50% respiratory variability, suggesting right atrial pressure of 3 mmHg. IAS/Shunts: No atrial level shunt detected by color flow Doppler.  LEFT VENTRICLE PLAX 2D LVIDd:         4.40 cm      Diastology LVIDs:         2.80 cm      LV e' medial:    7.29 cm/s LV PW:         1.30 cm      LV E/e' medial:  11.1 LV IVS:        1.50 cm      LV e' lateral:   12.40 cm/s LVOT diam:     1.80 cm      LV E/e' lateral: 6.5 LV SV:         46 LV SV Index:   20 LVOT Area:     2.54 cm  LV Volumes (MOD) LV vol d, MOD A2C: 36.9 ml LV vol d, MOD A4C: 120.0 ml LV vol s, MOD A2C: 11.2 ml LV vol s, MOD A4C: 36.3 ml LV SV MOD A2C:     25.7 ml LV SV MOD A4C:     120.0 ml LV SV MOD BP:      46.2 ml RIGHT VENTRICLE RV S prime:     9.03 cm/s TAPSE (  M-mode): 1.4 cm LEFT ATRIUM             Index       RIGHT ATRIUM          Index LA diam:        3.75 cm 1.67 cm/m  RA Area:     8.46 cm LA Vol (A2C):   13.5 ml 6.03 ml/m  RA Volume:   19.90 ml 8.88 ml/m LA Vol (A4C):   23.9 ml 10.67 ml/m LA Biplane Vol: 19.8 ml 8.84 ml/m  AORTIC VALVE LVOT Vmax:   75.10 cm/s LVOT Vmean:  49.700 cm/s LVOT VTI:    0.180 m  AORTA Ao Root diam: 3.60 cm Ao Asc diam:  4.00 cm MITRAL VALVE MV Area (PHT): 3.27 cm    SHUNTS MV Decel Time: 232 msec    Systemic VTI:  0.18 m MV E velocity: 80.60 cm/s  Systemic Diam: 1.80 cm MV A velocity: 82.70 cm/s MV E/A ratio:  0.97 Loralie Champagne MD Electronically signed by Loralie Champagne MD Signature Date/Time: 02/16/2020/3:57:28 PM    Final    CT HEAD CODE STROKE WO CONTRAST  Result Date: 02/15/2020 CLINICAL DATA:  Code stroke. Left-sided weakness. Facial droop and slurred speech. EXAM: CT HEAD WITHOUT CONTRAST TECHNIQUE: Contiguous axial images were obtained from the base of the skull through the vertex without intravenous contrast. COMPARISON:  December 31, 2019. FINDINGS: Brain: No evidence of acute large vascular territory infarct. There is  moderate to advanced patchy white matter hypoattenuation, nonspecific but most likely related to chronic microvascular ischemic disease. No acute hemorrhage. Moderate diffuse cerebral atrophy with ex vacuo ventricular dilation. No mass lesion or abnormal mass effect. No extra-axial fluid collection. Vascular: No definite hyperdense vessel. Mild diffuse hyperdensity of the dural sinuses and vessels appears similar to prior. Skull: No acute fracture. Sinuses/Orbits: Sinuses are largely clear. Other: No mastoid effusions. ASPECTS [facility] Stroke Program Early CT Score) Total score (0-10 with 10 being normal): 10 IMPRESSION: 1. No evidence of acute large vascular territory infarct.ASPECTS is 10. 2. Moderate to advanced patchy white matter hypoattenuation appears similar to prior and presumably related to chronic microvascular ischemic disease. A small white matter infarct could be obscured and MRI could better evaluate for acute ischemia if clinically indicated. Code stroke imaging results were communicated on 02/15/2020 at 1:45 pm to provider Dr. Theda Sers Via telephone, who verbally acknowledged these results. Electronically Signed   By: Margaretha Sheffield MD   On: 02/15/2020 13:48   VAS [place] LOWER EXTREMITY VENOUS (DVT)  Result Date: 02/16/2020  Lower Venous DVT Study Indications: Stroke.  Risk Factors: None identified. Comparison Study: No prior studies. Performing Technologist: Oliver Hum RVT  Examination Guidelines: A complete evaluation includes B-mode imaging, spectral Doppler, color Doppler, and power Doppler as needed of all accessible portions of each vessel. Bilateral testing is considered an integral part of a complete examination. Limited examinations for reoccurring indications may be performed as noted. The reflux portion of the exam is performed with the patient in reverse Trendelenburg.  +---------+---------------+---------+-----------+----------+--------------+ RIGHT     CompressibilityPhasicitySpontaneityPropertiesThrombus Aging +---------+---------------+---------+-----------+----------+--------------+ CFV      Full           Yes      Yes                                 +---------+---------------+---------+-----------+----------+--------------+ SFJ      Full                                                        +---------+---------------+---------+-----------+----------+--------------+  FV Prox  Full                                                        +---------+---------------+---------+-----------+----------+--------------+ FV Mid   Full                                                        +---------+---------------+---------+-----------+----------+--------------+ FV DistalFull                                                        +---------+---------------+---------+-----------+----------+--------------+ PFV      Full                                                        +---------+---------------+---------+-----------+----------+--------------+ POP      Full           Yes      Yes                                 +---------+---------------+---------+-----------+----------+--------------+ PTV      Full                                                        +---------+---------------+---------+-----------+----------+--------------+ PERO     Full                                                        +---------+---------------+---------+-----------+----------+--------------+   +---------+---------------+---------+-----------+----------+--------------+ LEFT     CompressibilityPhasicitySpontaneityPropertiesThrombus Aging +---------+---------------+---------+-----------+----------+--------------+ CFV      Full           Yes      Yes                                 +---------+---------------+---------+-----------+----------+--------------+ SFJ      Full                                                         +---------+---------------+---------+-----------+----------+--------------+ FV Prox  Full                                                        +---------+---------------+---------+-----------+----------+--------------+  FV Mid   Full                                                        +---------+---------------+---------+-----------+----------+--------------+ FV DistalFull                                                        +---------+---------------+---------+-----------+----------+--------------+ PFV      Full                                                        +---------+---------------+---------+-----------+----------+--------------+ POP      Full           Yes      Yes                                 +---------+---------------+---------+-----------+----------+--------------+ PTV      Full                                                        +---------+---------------+---------+-----------+----------+--------------+ PERO     Full                                                        +---------+---------------+---------+-----------+----------+--------------+     Summary: RIGHT: - There is no evidence of deep vein thrombosis in the lower extremity.  - No cystic structure found in the popliteal fossa.  LEFT: - There is no evidence of deep vein thrombosis in the lower extremity.  - No cystic structure found in the popliteal fossa.  *See table(s) above for measurements and observations. Electronically signed by Jamelle Haring on 02/16/2020 at 5:03:07 PM.    Final    CT ANGIO HEAD CODE STROKE  Result Date: 02/15/2020 CLINICAL DATA:  Speech difficulty and left-sided weakness. EXAM: CT ANGIOGRAPHY HEAD AND NECK TECHNIQUE: Multidetector CT imaging of the head and neck was performed using the standard protocol during bolus administration of intravenous contrast. Multiplanar CT image reconstructions and MIPs were obtained to evaluate the  vascular anatomy. Carotid stenosis measurements (when applicable) are obtained utilizing NASCET criteria, using the distal internal carotid diameter as the denominator. CONTRAST:  67mL OMNIPAQUE IOHEXOL 350 MG/ML SOLN COMPARISON:  Same day CT head. FINDINGS: CTA NECK FINDINGS Aortic arch: Calcific atherosclerosis of the aorta. Great vessel origins are patent. Right carotid system: No evidence of dissection, stenosis (50% or greater) or occlusion. Left carotid system: No evidence of dissection, stenosis (50% or greater) or occlusion. Vertebral arteries: Left dominant. The right vertebral artery is diminutive throughout its course. No evidence of significant focal stenosis in the neck. Skeleton: Mild  multilevel degenerative change of the cervical spine. Other neck: Enlarged, heterogeneous thyroid with numerous nodules and retrosternal extension. Upper chest: Paraseptal and centrilobular emphysema. No focal consolidation. Review of the MIP images confirms the above findings CTA HEAD FINDINGS Anterior circulation: No significant stenosis, proximal occlusion, aneurysm, or vascular malformation. 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 The left intradural vertebral artery is patent and without evidence of significant stenosis. Diminutive vertebrobasilar system with bilateral fetal type PCAs. Bilateral posterior cerebral arteries are patent without proximal hemodynamically significant stenosis. Evaluation of the distal PCAs is limited by venous contamination. Venous sinuses: As permitted by contrast timing, patent. Review of the MIP images confirms the above findings IMPRESSION: 1. 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 2. Otherwise, no large vessel occlusion or proximal hemodynamically significant stenosis in the head or neck 3. Diminutive vertebrobasilar system intracranially with  bilateral fetal type PCAs, anatomic variant. 4. Enlarged, heterogeneous thyroid with numerous nodules and retrosternal extension. This may reflect goiter; however, recommend thyroid ultrasound to further evaluate. 5. Paraseptal and centrilobular emphysema. Findings were communicated on 02/15/2020 at 1:45 pm to provider Dr. Theda Sers Via telephone, who verbally acknowledged these results. Electronically Signed   By: Margaretha Sheffield MD   On: 02/15/2020 14:00   CT ANGIO NECK CODE STROKE  Result Date: 02/15/2020 CLINICAL DATA:  Speech difficulty and left-sided weakness. EXAM: CT ANGIOGRAPHY HEAD AND NECK TECHNIQUE: Multidetector CT imaging of the head and neck was performed using the standard protocol during bolus administration of intravenous contrast. Multiplanar CT image reconstructions and MIPs were obtained to evaluate the vascular anatomy. Carotid stenosis measurements (when applicable) are obtained utilizing NASCET criteria, using the distal internal carotid diameter as the denominator. CONTRAST:  47mL OMNIPAQUE IOHEXOL 350 MG/ML SOLN COMPARISON:  Same day CT head. FINDINGS: CTA NECK FINDINGS Aortic arch: Calcific atherosclerosis of the aorta. Great vessel origins are patent. Right carotid system: No evidence of dissection, stenosis (50% or greater) or occlusion. Left carotid system: No evidence of dissection, stenosis (50% or greater) or occlusion. Vertebral arteries: Left dominant. The right vertebral artery is diminutive throughout its course. No evidence of significant focal stenosis in the neck. Skeleton: Mild multilevel degenerative change of the cervical spine. Other neck: Enlarged, heterogeneous thyroid with numerous nodules and retrosternal extension. Upper chest: Paraseptal and centrilobular emphysema. No focal consolidation. Review of the MIP images confirms the above findings CTA HEAD FINDINGS Anterior circulation: No significant stenosis, proximal occlusion, aneurysm, or vascular malformation.  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 The left intradural vertebral artery is patent and without evidence of significant stenosis. Diminutive vertebrobasilar system with bilateral fetal type PCAs. Bilateral posterior cerebral arteries are patent without proximal hemodynamically significant stenosis. Evaluation of the distal PCAs is limited by venous contamination. Venous sinuses: As permitted by contrast timing, patent. Review of the MIP images confirms the above findings IMPRESSION: 1. 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 2. Otherwise, no large vessel occlusion or proximal hemodynamically significant stenosis in the head or neck 3. Diminutive vertebrobasilar system intracranially with bilateral fetal type PCAs, anatomic variant. 4. Enlarged, heterogeneous thyroid with numerous nodules and retrosternal extension. This may reflect goiter; however, recommend thyroid ultrasound to further evaluate. 5. Paraseptal and centrilobular emphysema. Findings were communicated on 02/15/2020 at 1:45 pm to provider Dr. Theda Sers Via telephone, who verbally acknowledged these results. Electronically Signed   By: Albertina Parr  Adah Salvage MD   On: 02/15/2020 14:00    12-lead ECG on arrival shows NSR at 82 bpm (personally reviewed) All prior EKG's in EPIC reviewed with no documented atrial fibrillation  Telemetry shows NSR 70-80s. He did have two short (5-7 beat) runs of an SVT. No clear AF or AFL.  (personally reviewed)  Assessment and Plan:  1. Cryptogenic stroke The patient presents with cryptogenic stroke.  The patient does not have a TEE planned for this AM.  I spoke at length with the patient about monitoring for afib with an implantable loop recorder.  Risks, benefits, and alteratives to implantable loop recorder were discussed with the patient today.   At this time, the patient is very  clear in their decision to proceed with implantable loop recorder.   Wound care was reviewed with the patient (keep incision clean and dry for 3 days).  Wound check scheduled and entered in AVS. Please call with questions.   Shirley Friar, PA-C 02/20/2020 2:54 PM

## 2020-02-21 DIAGNOSIS — F17210 Nicotine dependence, cigarettes, uncomplicated: Secondary | ICD-10-CM | POA: Diagnosis not present

## 2020-02-21 NOTE — Progress Notes (Signed)
Progress Note    David Hendricks  DSK:[id number] DOB: 1950-12-24  DOA: 02/15/2020 PCP: Horald Pollen, MD    Brief Narrative:     Medical records reviewed and are as summarized below:  David Hendricks is an 70 y.o. male with a PMH oftobacco abuse 2 pack a day, COPD, CAD, HLD, hyperthyroidism, thyroid nodule, BPH, anxiety and depression, who presented to the ED via EMS with slurred speech and left-sided weakness. CODE STROKE activated. In the ED, evaluated by neurologist who noted dysarthria and a left facial droop. 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  Assessment/Plan:   Principal Problem:   CVA (cerebral vascular accident) ([place]) Active Problems:   Tobacco abuse   History of non-ST elevation myocardial infarction (NSTEMI)   Hyperlipidemia   Old MI (myocardial infarction)   CAD (coronary artery disease)   Cigarette nicotine dependence without complication   BPH (benign prostatic hyperplasia)   Multinodular goiter   Subclinical hyperthyroidism   Pulmonary emphysema (HCC)   Aortic atherosclerosis (HCC)   Thyroid mass   Anxiety and depression   Obesity   Hyperbilirubinemia   Dysphagia following cerebrovascular accident   Dysarthria due to recent cerebral infarction    Acute CVA (cerebrovascular accident) with moderate dysarthria, left facial weakness and dysphagia POA -telemetry.  -tPA not given because patient outside of normal window. -CT head showed no evidence of acute large vascular territory infarct. 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 CTA of head/neck showed nondominant right  vertebral artery is diminutive throughout its course and likely occluded intradurally.  aspirin 81 mg daily and clopidogrel 75 mg daily. Continue DAPT for 3 weeks and then plavix alone -FLP showed a cholesterol of 168, LDL 111. Continue statin.   -HgbA1c 5.2%.  -2 D Echocardiogram showed EF 62-03%, grade I diastolic dysfunction and mild LVH.  -SLP following: DYS diet -palliative consult placed for GOC -loop placed 2/11 -plan for CIR 24-48 hours when bed    Agitation -appears resolved -calm and cooperative    Hyperbilirubinemia Mild, unclear etiology. Outpatient work up recommended.    Tobacco abuse, POA RN to provide tobacco cessation counseling. Continue nicoderm patch 21 mg (smokes 2 PPD).    Hyperlipidemia, POA FLP showed a cholesterol of 168, LDL 111. Continue statin.    CAD (coronary artery disease) with h/o MI and aortic atherosclerosis, POA Continue ASA/statin.    Subclinical hyperthyroidism/thyroid mass/multinodular goiter, POA Continue methimazole.    Pulmonary emphysema (HCC) Continue PRN albuterol. Not taking symbicort anymore.    Anxiety and depression Continue Desyrel QHS.    BPH, POA Continue Flomax.   Await CIR bed   Family Communication/Anticipated D/C date and plan/Code Status    Code Status: Full Code.  Disposition Plan: Status is: Inpatient  Remains inpatient appropriate because:Inpatient level of care appropriate due to severity of illness   Dispo: The patient is from: Home              Anticipated d/c is to:CIR              Anticipated d/c date is: when bed available               Medical  Consultants:    Neurology  Palliative care  EP (loop only)  Subjective:   No complaints this AM  Objective:    Vitals:   02/20/20 2004 02/20/20 2343 02/21/20 0410 02/21/20 1217  BP: 126/64 127/69 110/70 (!) 108/46  Pulse: 74 72 72 63  Resp: 20 19 18 18   Temp: 98.2 F (36.8 C) 97.7 F (36.5 C) 98.5 F (36.9 C) 98.4 F  (36.9 C)  TempSrc: Oral Oral Oral Oral  SpO2: 94% 97% 90% 93%  Weight:      Height:        Intake/Output Summary (Last 24 hours) at 02/21/2020 1226 Last data filed at 02/20/2020 2000 Gross per 24 hour  Intake 23.22 ml  Output 700 ml  Net -676.78 ml   Filed Weights   02/15/20 1355  Weight: 99.8 kg    Exam:  General: Appearance:     Overweight male in no acute distress     Lungs:     respirations unlabored  Heart:    Normal heart rate. Normal rhythm. No murmurs, rubs, or gallops.   MS:   All extremities are intact.   Neurologic:   Awake, alert, speech slurred     Data Reviewed:   I have personally reviewed following labs and imaging studies:  Labs: Labs show the following:   Basic Metabolic Panel: Recent Labs  Lab 02/15/20 1325 02/15/20 1333 02/16/20 0500 02/19/20 0218  NA 139 139 137 137  K 4.4 3.9 3.7 3.6  CL 102 102 101 102  CO2 25  --  24 23  GLUCOSE 104* 104* 92 83  BUN 9 10 10 11   CREATININE 1.00 0.90 0.98 0.85  CALCIUM 9.6  --  8.8* 8.4*   GFR Estimated Creatinine Clearance: 100.5 mL/min (by C-G formula based on SCr of 0.85 mg/dL). Liver Function Tests: Recent Labs  Lab 02/15/20 1325 02/16/20 0500  AST 33 24  ALT 18 17  ALKPHOS 106 95  BILITOT 1.5* 1.5*  PROT 6.7 6.3*  ALBUMIN 3.6 3.5   No results for input(s): LIPASE, AMYLASE in the last 168 hours. No results for input(s): AMMONIA in the last 168 hours. Coagulation profile Recent Labs  Lab 02/15/20 1325  INR 0.9    CBC: Recent Labs  Lab 02/15/20 1325 02/15/20 1333 02/16/20 0500 02/19/20 0218  WBC 9.7  --  13.9* 9.8  NEUTROABS 6.3  --   --   --   HGB 18.3* 18.4* 16.9 16.4  HCT 55.2* 54.0* 48.6 49.3  MCV 86.4  --  85.7 86.5  PLT 192  --  170 160   Cardiac Enzymes: Recent Labs  Lab 02/15/20 1815  CKTOTAL 62   BNP (last 3 results) No results for input(s): PROBNP in the last 8760 hours. CBG: Recent Labs  Lab 02/15/20 1408 02/20/20 1506  GLUCAP 96 114*    D-Dimer: No results for input(s): DDIMER in the last 72 hours. Hgb A1c: No results for input(s): HGBA1C in the last 72 hours. Lipid Profile: No results for input(s): CHOL, HDL, LDLCALC, TRIG, CHOLHDL, LDLDIRECT in the last 72 hours. Thyroid function studies: No results for input(s): TSH, T4TOTAL, T3FREE, THYROIDAB in the last 72 hours.  Invalid input(s): FREET3 Anemia work up: No results for input(s): VITAMINB12, FOLATE, FERRITIN, TIBC, IRON, RETICCTPCT in the last 72 hours. Sepsis Labs: Recent Labs  Lab 02/15/20 1325 02/15/20 1815 02/16/20 0500 02/19/20 0218  WBC 9.7  --  13.9* 9.8  LATICACIDVEN  --  1.0  --   --  Microbiology Recent Results (from the past 240 hour(s))  SARS CORONAVIRUS 2 (TAT 6-24 HRS) Nasopharyngeal Nasopharyngeal Swab     Status: None   Collection Time: 02/15/20  2:26 PM   Specimen: Nasopharyngeal Swab  Result Value Ref Range Status   SARS Coronavirus 2 NEGATIVE NEGATIVE Final    Comment: (NOTE) SARS-CoV-2 target nucleic acids are NOT DETECTED.  The SARS-CoV-2 RNA is generally detectable in upper and lower respiratory specimens during the acute phase of infection. Negative results do not preclude SARS-CoV-2 infection, do not rule out co-infections with other pathogens, and should not be used as the sole basis for treatment or other patient management decisions. Negative results must be combined with clinical observations, patient history, and epidemiological information. The expected result is Negative.  Fact Sheet for Patients: SugarRoll.be  Fact Sheet for Healthcare Providers: https://www.woods-mathews.com/  This test is not yet approved or cleared by the [place] FDA and  has been authorized for detection and/or diagnosis of SARS-CoV-2 by FDA under an Emergency Use Authorization (EUA). This EUA will remain  in effect (meaning this test can be used) for the duration of the COVID-19  declaration under Se ction 564(b)(1) of the Act, 21 U.S.C. section 360bbb-3(b)(1), unless the authorization is terminated or revoked sooner.  Performed at [facility], [place] [address], [place], [place] [address]     Procedures and diagnostic studies:  No results found.  Medications:   .  stroke: mapping our early stages of recovery book   Does not apply Once  . aspirin EC  81 mg Oral Daily  . atorvastatin  80 mg Oral Daily  . chlorhexidine  15 mL Mouth Rinse BID  . clopidogrel  75 mg Oral Daily  . mouth rinse  15 mL Mouth Rinse q12n4p  . methimazole  5 mg Oral Daily  . nicotine  21 mg Transdermal Daily  . pantoprazole  40 mg Oral BID  . tamsulosin  0.4 mg Oral Daily  . traZODone  50 mg Oral QHS   Continuous Infusions:    LOS: 6 days   Geradine Girt  [facility]   How to contact the [facility] Attending or Consulting provider [place] or covering provider during after hours [place], for this patient?  1. Check the care team in [facility] and look for a) attending/consulting TR[facility] provider listed and b) the [facility] team listed 2. Log into [email or web address] and use [place]'s universal password to access. If you do not have the password, please contact the hospital operator. 3. Locate the [facility] provider you are looking for under [facility] and page to a number that you can be directly reached. 4. If you still have difficulty reaching the provider, please page the [facility] (Director on Call) for the Hospitalists listed on amion for assistance.  02/21/2020, 12:26 PM

## 2020-02-22 DIAGNOSIS — I69322 Dysarthria following cerebral infarction: Secondary | ICD-10-CM | POA: Diagnosis not present

## 2020-02-22 DIAGNOSIS — I69391 Dysphagia following cerebral infarction: Secondary | ICD-10-CM | POA: Diagnosis not present

## 2020-02-22 DIAGNOSIS — F17210 Nicotine dependence, cigarettes, uncomplicated: Secondary | ICD-10-CM | POA: Diagnosis not present

## 2020-02-22 DIAGNOSIS — N400 Enlarged prostate without lower urinary tract symptoms: Secondary | ICD-10-CM | POA: Diagnosis not present

## 2020-02-22 DIAGNOSIS — I700 Atherosclerosis of aorta: Secondary | ICD-10-CM | POA: Diagnosis not present

## 2020-02-22 DIAGNOSIS — I639 Cerebral infarction, unspecified: Secondary | ICD-10-CM | POA: Diagnosis not present

## 2020-02-22 MED ORDER — CLOPIDOGREL BISULFATE 75 MG PO TABS: 75.0000 mg | ORAL_TABLET | Freq: Every day | ORAL | Status: DC

## 2020-02-22 MED ORDER — SALINE SPRAY 0.65 % NA SOLN
1.0000 | NASAL | Status: DC | PRN
  Administered 2020-02-22 – 2020-02-23 (×2): 1 via NASAL
  Filled 2020-02-22: qty 44

## 2020-02-22 MED ORDER — MELATONIN 3 MG PO TABS
3.0000 mg | ORAL_TABLET | Freq: Once | ORAL | Status: AC
  Administered 2020-02-22: 3 mg via ORAL
  Filled 2020-02-22: qty 1

## 2020-02-22 MED ORDER — NICOTINE 21 MG/24HR TD PT24: 21.0000 mg | MEDICATED_PATCH | Freq: Every day | TRANSDERMAL | 0 refills | Status: DC

## 2020-02-22 MED ORDER — MELATONIN 3 MG PO TABS: 3.0000 mg | ORAL_TABLET | Freq: Every day | ORAL | 0 refills | Status: DC

## 2020-02-22 MED ORDER — RESOURCE THICKENUP CLEAR PO POWD: ORAL | Status: DC

## 2020-02-22 MED ORDER — MELATONIN 3 MG PO TABS
3.0000 mg | ORAL_TABLET | Freq: Every day | ORAL | Status: DC
  Administered 2020-02-22 – 2020-02-23 (×2): 3 mg via ORAL
  Filled 2020-02-22 (×2): qty 1

## 2020-02-22 NOTE — Progress Notes (Signed)
Call received from patient's family member David Hendricks saying " my brother in-law called me complaining he cannot sleep and having difficulty breathing.Can you have the Doctor order him additional sleeping pill?" I told David Hendricks , he already received   Trazodone @2127  Upon assessment oxygen on room air 92%. He was placed on oxygen for comfort. On call provider paged for orders. Patient informed to call me for help before calling family member at home.

## 2020-02-22 NOTE — Progress Notes (Signed)
  Speech Language Pathology Treatment: Dysphagia  Patient Details Name: David Hendricks MRN: [id number] DOB: [birth year] Today's Date: 02/22/2020 Time: 7902-4097 SLP Time Calculation (min) (ACUTE ONLY): 20 min  Assessment / Plan / Recommendation Clinical Impression  SLP has been following pt. New order was received on 2/13 with question of whether pt's diet my be advanced. Pt was seen for dysphagia treatment and reported that the food "tastes like nothing". Pt was asked whether he recalled the results of the modified barium swallow study and the rationale for his current diet and swallowing precautions. Pt initially stated that he was not informed the resulted, but subsequently stated, "Well I may have been told, but you know my memory is about that small (pt showed a very small part of his finger)". Pt was re-educated regarding the results of the modified barium swallow study, recommendations, and compensatory strategies. Pt reported that he has been consistently using the chin tuck posture, but required frequent cues for this. Pt exhibited coughing with ice chips. No s/sx of aspiration were noted with advanced trials of nectar thick liquids or of dysphagia 2 solids. Pt was edentulous, but mastication of dysphagia 2 solids was functional and no significant oral residue was noted. Silent aspiration was demonstrated on his modified barium swallow study on 2/9 and this cannot be ruled out at bedside. It is recommended that his current diet be continued with dysphagia treatment to improve the swallow function and likely repeat instrumental assessment to rule out silent aspiration.    HPI HPI: Pt is a 70 y/o male admitted secondary to slurred speech and L sided weakness. 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 PMH includes tobacco use, CAD, COPD, anxiety, and depression.      SLP Plan  Continue with current plan of care       Recommendations  Diet recommendations: Dysphagia 1 (puree);Honey-thick  liquid Liquids provided via: Cup;No straw Medication Administration: Crushed with puree Supervision: Staff to assist with self feeding Compensations: Minimize environmental distractions;Slow rate;Follow solids with liquid;Chin tuck;Other (Comment) (cue pt to bring food back into his mouth and reswallow) Postural Changes and/or Swallow Maneuvers: Seated upright 90 degrees;Upright 30-60 min after meal                Oral Care Recommendations: Oral care QID Follow up Recommendations: Inpatient Rehab SLP Visit Diagnosis: Cognitive communication deficit (R41.841);Dysphagia, oropharyngeal phase (R13.12) Plan: Continue with current plan of care       Elizibeth Breau I. Hardin Negus, [place], [place] Office number [phone] Pager [place] 02/22/2020, 3:51 PM

## 2020-02-22 NOTE — Care Management (Signed)
4301 02-22-20 Case Manager reached out to [facility] and inpatient rehab can possibly accept the patient on 02-23-20. Case Manager will continue to follow for additional toc needs. Graves-Bigelow, Ocie Cornfield, RN, BSN Case Manager

## 2020-02-22 NOTE — Progress Notes (Signed)
Daily Progress Note   Patient Name: David Hendricks       Date: 02/22/2020 DOB: 1950/02/16  Age: 70 y.o. MRN#: [id number] Attending Physician: Geradine Girt, DO Primary Care Physician: Horald Pollen, MD Admit Date: 02/15/2020  Reason for Consultation/Follow-up: Establishing goals of care  Subjective/GOC: Patient awake, alert, oriented, and in good spirits this afternoon. Sitting up in recliner but ready to get back in bed. Speech appears to be improved compared to this NP assessment on Friday.   Brother and SIL Merry Proud and [name]) at bedside. HCPOA and living will documentation was completed on Friday. Reviewed and copy in chart for EMR.   [name] would like to complete MOST form with Vraj this afternoon.   Nikolas shares that at this point, he would wish for resuscitation to be attempted and short-term ventilator support if he had a chance of survival. Yehuda is very clear that he would NOT wish for prolonged interventions such as trach or feeding tube. He does not even wish for short-term feeding tube. He does not wish to live as a "vegetable." He states "pull the plug." [name] and Merry Proud understand and respect his wishes.   MOST form completed with current wishes: FULL code/FULL scope treatment, short-term ventilator, would NOT wish for prolonged measures such as tracheostomy, IVF/ABX if indicated, NO feeding tube. Copy placed in chart and given to [facility].   Reviewed plan of care including hopes for CIR transfer tomorrow.   Answered questions. [name] has PMT contact information and understands she can call with questions.    Length of Stay: 7  Current Medications: Scheduled Meds:  .  stroke: mapping our early stages of recovery book   Does not apply Once  . aspirin EC  81 mg Oral Daily  . atorvastatin   80 mg Oral Daily  . chlorhexidine  15 mL Mouth Rinse BID  . clopidogrel  75 mg Oral Daily  . mouth rinse  15 mL Mouth Rinse q12n4p  . melatonin  3 mg Oral QHS  . methimazole  5 mg Oral Daily  . nicotine  21 mg Transdermal Daily  . pantoprazole  40 mg Oral BID  . tamsulosin  0.4 mg Oral Daily  . traZODone  50 mg Oral QHS    Continuous Infusions:   PRN Meds: acetaminophen **OR** acetaminophen (TYLENOL)  oral liquid 160 mg/5 mL **OR** acetaminophen, albuterol, bisacodyl, ondansetron (ZOFRAN) IV, Resource ThickenUp Clear, senna-docusate, sodium chloride  Physical Exam Vitals and nursing note reviewed.  Constitutional:      General: He is awake.  HENT:     Head: Normocephalic and atraumatic.  Pulmonary:     Effort: No tachypnea, accessory muscle usage or respiratory distress.  Skin:    General: Skin is warm and dry.  Neurological:     Mental Status: He is alert and oriented to person, place, and time.     Comments: Oriented, dysarthria, bilateral weakness L>R            Vital Signs: BP 117/71 (BP Location: Right Arm)   Pulse 72   Temp 98.3 F (36.8 C) (Oral)   Resp 18   Ht 6\' 1"  (1.854 m)   Wt 99.8 kg   SpO2 98%   BMI 29.03 kg/m  SpO2: SpO2: 98 % O2 Device: O2 Device: Room Air O2 Flow Rate: O2 Flow Rate (L/min): 2 L/min  Intake/output summary:   Intake/Output Summary (Last 24 hours) at 02/22/2020 1340 Last data filed at 02/22/2020 7616 Gross per 24 hour  Intake --  Output 1100 ml  Net -1100 ml   LBM: Last BM Date: 02/21/20 Baseline Weight: Weight: 99.8 kg Most recent weight: Weight: 99.8 kg       Palliative Assessment/Data: PPS 50%      Patient Active Problem List   Diagnosis Date Noted  . Dysphagia following cerebrovascular accident 02/17/2020  . Dysarthria due to recent cerebral infarction 02/17/2020  . Obesity 02/16/2020  . Hyperbilirubinemia 02/16/2020  . CVA (cerebral vascular accident) ([place]) 02/15/2020  . Thyroid mass 02/15/2020  . Anxiety and  depression 02/15/2020  . Pulmonary emphysema ([place]) 06/02/2019  . Aortic atherosclerosis ([place]) 06/02/2019  . Smokers' cough ([place]) 06/02/2019  . 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  . Multinodular goiter 12/24/2018  . Subclinical hyperthyroidism 12/24/2018  . BPH (benign prostatic hyperplasia) 11/20/2018  . Pulmonary nodules 11/15/2018  . Esophageal thickening 11/15/2018  . Cigarette nicotine dependence without complication 07/37/1062  . Old MI (myocardial infarction) 01/30/2017  . CAD (coronary artery disease) 01/30/2017  . Hyperlipidemia 10/10/2016  . History of non-ST elevation myocardial infarction (NSTEMI) 10/09/2016  . Tobacco abuse 08/28/2016  . Difficulty sleeping 04/22/2016  . Benign prostatic hyperplasia with urinary frequency 02/11/2016    Palliative Care Assessment & Plan   Patient Profile: 70 y.o. male  with past medical history of COPD, CAD, HLD, hyperthyroidism, thyroid nodule, BPH, anxiety, depression, 2 pack a day smoker admitted on 02/15/2020 with slurred speech and left-sided weakness. Code stroke activated. MRI confirmed acute cortical and subcortical infarct high right posterior frontal lobe, possible additional punctate infarct with moderate to advanced microvascular ischemic disease. PT/OT/SLP following. Patient passed MBS and started on dysphagia diet. Palliative medicine consultation for goals of care.    Assessment: Acute CVA Dysarthria Left weakness Dysphagia Hx of tobacco abuse Hx of CAD Hx of pulmonary emphysema Anxiety Depression BPH  Recommendations/Plan:  Continue full code/full scope treatment.  Waiting on CIR bed. Possibly transfer tomorrow 2/14.  Appreciate chaplain support with completing patient's living will/HCPOA paperwork with SIL [name]) as primary HCPOA and brother Merry Proud) as secondary HCPOA.   MOST form completed: Patient's current wishes are for resuscitation to be attempted if necessary and short-term ventilator support. He would NOT  wish for prolonged interventions including tracheostomy. IVF/ABX if indicated, NO feeding tube. Copy of MOST form given to [facility] and placed in chart.  May benefit from outpatient palliative.   Goals of Care and Additional Recommendations:  Limitations on Scope of Treatment: Full Scope Treatment  Code Status: FULL   Code Status Orders  (From admission, onward)         Start     Ordered   02/15/20 1621  Full code  Continuous        02/15/20 1624        Code Status History    Date Active Date Inactive Code Status Order ID Comments User Context   02/15/2020 1615 02/15/2020 1624 Full Code [id number]  Charlann Lange, MD ED   11/19/2018 1906 11/22/2018 1538 Full Code [id number]  Rhetta Mura, DO ED   11/15/2018 1120 11/19/2018 0807 Full Code [id number]  Karmen Bongo, MD ED   01/23/2017 2113 01/24/2017 2023 Full Code [id number]  Elwin Mocha, MD ED   10/09/2016 2000 10/10/2016 1522 Full Code [id number]  Nelva Bush, MD Inpatient   10/09/2016 2000 10/09/2016 2000 Full Code [id number]  Leanor Kail, PA Inpatient   Advance Care Planning Activity       Prognosis:   Unable to determine  Discharge Planning:  CIR  Care plan was discussed with RN, patient, sister-in-law [name]), brother Merry Proud)  Thank you for allowing the Palliative Medicine Team to assist in the care of this patient.   Total Time 35 Prolonged Time Billed  no      Greater than 50%  of this time was spent counseling and coordinating care related to the above assessment and plan.  Ihor Dow, DNP, FNP-C Palliative Medicine Team  Phone: [phone] Fax: [phone]  Please contact Palliative Medicine Team phone at [phone] for questions and concerns.

## 2020-02-22 NOTE — Discharge Summary (Signed)
Physician Discharge Summary  David Rodriquez YCX:[id number] DOB: 1950/06/23 DOA: 02/15/2020  PCP: Horald Pollen, MD  Admit date: 02/15/2020 Discharge date: 02/23/2020  Admitted From: home Discharge disposition: CIR   Recommendations for Outpatient Follow-Up:   Outpatient neurology follow up aspirin 81 mg daily and clopidogrel 75 mg daily. Continue DAPT for 3 weeks and then plavix alone   Discharge Diagnosis:   Principal Problem:   CVA (cerebral vascular accident) ([place]) Active Problems:   Tobacco abuse   History of non-ST elevation myocardial infarction (NSTEMI)   Hyperlipidemia   Old MI (myocardial infarction)   CAD (coronary artery disease)   Cigarette nicotine dependence without complication   BPH (benign prostatic hyperplasia)   Multinodular goiter   Subclinical hyperthyroidism   Pulmonary emphysema (HCC)   Aortic atherosclerosis (HCC)   Thyroid mass   Anxiety and depression   Obesity   Hyperbilirubinemia   Dysphagia following cerebrovascular accident   Dysarthria due to recent cerebral infarction    Discharge Condition: Improved.  Diet recommendation: DYS  Wound care: None.  Code status: Full.   History of Present Illness:   David Hendricks is a 70 y.o. male with medical history significant of tobacco abuse 2 pack a day, COPD, CKD, CAD, HLD, hyperthyroidism, thyroid nodule, BPH, anxiety and depression, who presented with slurred speech and left-sided weakness.  Patient lives home with his dog.Marland Kitchen  He reports that he was last normal at 1 AM when he went to bed last night.  Patient woke up around 11 AM today and noticed that he had slurred speech and left-sided weakness.  He called the EMS who noted aphasia, left-sided facial droop and left upper and lower extremity weakness.  Patient was brought to the ED for code stroke.  Of note, patient reports nonproductive cough for 2 weeks but cannot give other details.  Denies fevers, chills, shortness of breath,  wheezing, chest pain, chest pressure, nausea, vomiting, diarrhea, abdominal pain, dysuria or urinary frequency or urgency.  In the emergency room, he was afebrile with pulse 73, RR 20, BP 124/56 and room air O2 sat 94%.  The labs showed nonrevealing CBC and BMP, glucose 96, normal TSH, Covid pending. EKG showed no ischemic change. CXR showed no active disease. Head CT and CTA head and neck showed no evidence of acute infarct. 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 Enlarged, heterogeneous thyroid.  Neurology was consulted.   Hospital Course by Problem:   Acute CVA (cerebrovascular accident)with moderate dysarthria, left facial weakness and dysphagiaPOA.  -tPA not given because patient outside of normal window. -CT head showed no evidence of acute large vascular territory infarct. 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 CTA of head/neck showed nondominant right vertebral artery is diminutive throughout its course and likely occluded intradurally.  aspirin 81 mg daily and clopidogrel 75 mg daily. Continue DAPT for 3 weeks and then plavix alone -FLP showed a cholesterol of 168, LDL 111. Continue statin.  -HgbA1c 5.2%.  -2 D Echocardiogram showed EF 49-70%, grade I diastolic dysfunction and mild LVH. -SLP following: DYS diet -palliative consult placed for GOC -loop placed 2/11 -CIR when bed available  Agitation -appears resolved -calm and cooperative with periods of frustration regarding diet and waiting for CIR bed  Hyperbilirubinemia Mild, unclear etiology. Outpatient work  up recommended.  Tobacco abuse, POA RN to provide tobacco cessation counseling.Continue nicoderm patch 21 mg (smokes 2 PPD).  Hyperlipidemia,  POA FLP showed a cholesterol of 168, LDL 111. Continue statin.  CAD (coronary artery disease) with h/o MI and aortic atherosclerosis, POA Continue ASA/statin.  Subclinical hyperthyroidism/thyroid mass/multinodular goiter, POA Continue methimazole.  Pulmonary emphysema (HCC) Continue PRN albuterol. Not taking symbicort anymore.  Anxiety and depression ContinueDesyrel QHS.  BPH, POA Continue Flomax.     Medical Consultants:   neurology   Discharge Exam:   Vitals:   02/21/20 2344 02/22/20 0620  BP: 134/72 111/64  Pulse: 74 62  Resp: 20 18  Temp: 97.6 F (36.4 C) 97.8 F (36.6 C)  SpO2: 93% 94%   Vitals:   02/21/20 1834 02/21/20 1948 02/21/20 2344 02/22/20 0620  BP: 130/69 104/69 134/72 111/64  Pulse: 85 82 74 62  Resp: 16 18 20 18   Temp: 97.9 F (36.6 C) 97.6 F (36.4 C) 97.6 F (36.4 C) 97.8 F (36.6 C)  TempSrc: Oral Oral Oral Oral  SpO2: 90% 90% 93% 94%  Weight:      Height:        General exam: Appears calm and comfortable  The results of significant diagnostics from this hospitalization (including imaging, microbiology, ancillary and laboratory) are listed below for reference.     Procedures and Diagnostic Studies:   MR BRAIN WO CONTRAST  Result Date: 02/15/2020 CLINICAL DATA:  Stroke follow-up. EXAM: MRI HEAD WITHOUT CONTRAST TECHNIQUE: Multiplanar, multiecho pulse sequences of the brain and surrounding structures were obtained without intravenous contrast. COMPARISON:  None. FINDINGS: Brain: Acute cortical and subcortical infarct in the high right posterior frontal lobe no substantial edema or mass effect. Small focus of susceptibility artifact in this region may represent a small thrombosed distal cortical MCA branch (see series 18, image 40). There is an additional possible punctate focus of restricted diffusion in the right parietal cortex (see series 9, image 93 and series 10, image 39). Moderate to advanced additional scattered  T2/FLAIR hyperintensities within the white matter, most likely related to chronic microvascular ischemic disease. No hydrocephalus. No acute hemorrhage. No extra-axial fluid collection. No abnormal mass lesion or abnormal mass effect. No midline shift. Basal cisterns are patent. Additional small focus of susceptibility artifact in the right parietal lobe may represent the sequela of prior microhemorrhage. Vascular: Major arterial flow voids are maintained at the skull base. Skull and upper cervical spine: Normal marrow signal. Sinuses/Orbits: Largely clear. Other: No mastoid effusions. IMPRESSION: 1. Acute cortical and subcortical infarct in the high right posterior frontal lobe. Small focus of susceptibility artifact in this region may represent a small thrombosed distal cortical MCA branch. 2. Possible additional punctate infarct in the right parietal cortex versus artifact. 3. No substantial edema or mass effect. 4. Moderate to advanced chronic microvascular ischemic disease. These results will be called to the ordering clinician or representative by the Radiologist Assistant, and communication documented in the PACS or [employer]. Electronically Signed   By: Margaretha Sheffield MD   On: 02/15/2020 18:44   DG Chest Port 1 View  Result Date: 02/15/2020 CLINICAL DATA:  Cough. EXAM: PORTABLE CHEST 1 VIEW COMPARISON:  April 23, 2019. FINDINGS: The heart size and mediastinal contours are within normal limits. Both lungs are clear. The visualized skeletal structures are unremarkable. IMPRESSION: No active disease. Electronically Signed   By: Marijo Conception M.D.   On: 02/15/2020 15:43   ECHOCARDIOGRAM COMPLETE  Result Date: 02/16/2020  ECHOCARDIOGRAM REPORT   Patient Name:   DERYK BOZMAN Date of Exam: 02/16/2020 Medical Rec #:  [id number]  Height:       73.0 in Accession #:    [id number] Weight:       220.0 lb Date of Birth:  11/18/1950  BSA:          2.241 m Patient Age:    8 years   BP:           116/57 mmHg  Patient Gender: M          HR:           60 bpm. Exam Location:  Inpatient Procedure: 2D Echo, Cardiac Doppler and Color Doppler Indications:    Stroke  History:        Patient has prior history of Echocardiogram examinations, most                 recent 01/24/2017. Previous Myocardial Infarction and CAD,                 Stroke, Signs/Symptoms:Chest Pain; Risk Factors:Current Smoker.  Sonographer:    Roseanna Rainbow RDCS Referring Phys: [phone] NA LI  Sonographer Comments: Technically difficult study due to poor echo windows. IMPRESSIONS  1. Left ventricular ejection fraction, by estimation, is 60 to 65%. The left ventricle has normal function. The left ventricle has no regional wall motion abnormalities. There is mild left ventricular hypertrophy. Left ventricular diastolic parameters are consistent with Grade I diastolic dysfunction (impaired relaxation).  2. Right ventricular systolic function is mildly reduced. The right ventricular size is mildly enlarged. Tricuspid regurgitation signal is inadequate for assessing PA pressure.  3. The mitral valve is normal in structure. Trivial mitral valve regurgitation. No evidence of mitral stenosis.  4. The aortic valve is tricuspid. Aortic valve regurgitation is not visualized. No aortic stenosis is present.  5. Aortic dilatation noted. There is mild dilatation of the ascending aorta, measuring 40 mm.  6. The inferior vena cava is normal in size with greater than 50% respiratory variability, suggesting right atrial pressure of 3 mmHg. FINDINGS  Left Ventricle: Left ventricular ejection fraction, by estimation, is 60 to 65%. The left ventricle has normal function. The left ventricle has no regional wall motion abnormalities. The left ventricular internal cavity size was normal in size. There is  mild left ventricular hypertrophy. Left ventricular diastolic parameters are consistent with Grade I diastolic dysfunction (impaired relaxation). Right Ventricle: The right ventricular size  is mildly enlarged. No increase in right ventricular wall thickness. Right ventricular systolic function is mildly reduced. Tricuspid regurgitation signal is inadequate for assessing PA pressure. Left Atrium: Left atrial size was normal in size. Right Atrium: Right atrial size was normal in size. Pericardium: Trivial pericardial effusion is present. Mitral Valve: The mitral valve is normal in structure. Trivial mitral valve regurgitation. No evidence of mitral valve stenosis. Tricuspid Valve: The tricuspid valve is normal in structure. Tricuspid valve regurgitation is not demonstrated. Aortic Valve: The aortic valve is tricuspid. Aortic valve regurgitation is not visualized. No aortic stenosis is present. Pulmonic Valve: The pulmonic valve was normal in structure. Pulmonic valve regurgitation is not visualized. Aorta: Aortic dilatation noted. There is mild dilatation of the ascending aorta, measuring 40 mm. Venous: The inferior vena cava is normal in size with greater than 50% respiratory variability, suggesting right atrial pressure of 3 mmHg. IAS/Shunts: No atrial level shunt detected by color flow Doppler.  LEFT VENTRICLE PLAX 2D LVIDd:  4.40 cm      Diastology LVIDs:         2.80 cm      LV e' medial:    7.29 cm/s LV PW:         1.30 cm      LV E/e' medial:  11.1 LV IVS:        1.50 cm      LV e' lateral:   12.40 cm/s LVOT diam:     1.80 cm      LV E/e' lateral: 6.5 LV SV:         46 LV SV Index:   20 LVOT Area:     2.54 cm  LV Volumes (MOD) LV vol d, MOD A2C: 36.9 ml LV vol d, MOD A4C: 120.0 ml LV vol s, MOD A2C: 11.2 ml LV vol s, MOD A4C: 36.3 ml LV SV MOD A2C:     25.7 ml LV SV MOD A4C:     120.0 ml LV SV MOD BP:      46.2 ml RIGHT VENTRICLE RV S prime:     9.03 cm/s TAPSE (M-mode): 1.4 cm LEFT ATRIUM             Index       RIGHT ATRIUM          Index LA diam:        3.75 cm 1.67 cm/m  RA Area:     8.46 cm LA Vol (A2C):   13.5 ml 6.03 ml/m  RA Volume:   19.90 ml 8.88 ml/m LA Vol (A4C):   23.9 ml  10.67 ml/m LA Biplane Vol: 19.8 ml 8.84 ml/m  AORTIC VALVE LVOT Vmax:   75.10 cm/s LVOT Vmean:  49.700 cm/s LVOT VTI:    0.180 m  AORTA Ao Root diam: 3.60 cm Ao Asc diam:  4.00 cm MITRAL VALVE MV Area (PHT): 3.27 cm    SHUNTS MV Decel Time: 232 msec    Systemic VTI:  0.18 m MV E velocity: 80.60 cm/s  Systemic Diam: 1.80 cm MV A velocity: 82.70 cm/s MV E/A ratio:  0.97 Loralie Champagne MD Electronically signed by Loralie Champagne MD Signature Date/Time: 02/16/2020/3:57:28 PM    Final    CT HEAD CODE STROKE WO CONTRAST  Result Date: 02/15/2020 CLINICAL DATA:  Code stroke. Left-sided weakness. Facial droop and slurred speech. EXAM: CT HEAD WITHOUT CONTRAST TECHNIQUE: Contiguous axial images were obtained from the base of the skull through the vertex without intravenous contrast. COMPARISON:  December 31, 2019. FINDINGS: Brain: No evidence of acute large vascular territory infarct. There is moderate to advanced patchy white matter hypoattenuation, nonspecific but most likely related to chronic microvascular ischemic disease. No acute hemorrhage. Moderate diffuse cerebral atrophy with ex vacuo ventricular dilation. No mass lesion or abnormal mass effect. No extra-axial fluid collection. Vascular: No definite hyperdense vessel. Mild diffuse hyperdensity of the dural sinuses and vessels appears similar to prior. Skull: No acute fracture. Sinuses/Orbits: Sinuses are largely clear. Other: No mastoid effusions. ASPECTS [facility] Stroke Program Early CT Score) Total score (0-10 with 10 being normal): 10 IMPRESSION: 1. No evidence of acute large vascular territory infarct.ASPECTS is 10. 2. Moderate to advanced patchy white matter hypoattenuation appears similar to prior and presumably related to chronic microvascular ischemic disease. A small white matter infarct could be obscured and MRI could better evaluate for acute ischemia if clinically indicated. Code stroke imaging results were communicated on 02/15/2020 at 1:45 pm to  provider Dr. Theda Sers  Via telephone, who verbally acknowledged these results. Electronically Signed   By: Margaretha Sheffield MD   On: 02/15/2020 13:48   VAS [place] LOWER EXTREMITY VENOUS (DVT)  Result Date: 02/16/2020  Lower Venous DVT Study Indications: Stroke.  Risk Factors: None identified. Comparison Study: No prior studies. Performing Technologist: Oliver Hum RVT  Examination Guidelines: A complete evaluation includes B-mode imaging, spectral Doppler, color Doppler, and power Doppler as needed of all accessible portions of each vessel. Bilateral testing is considered an integral part of a complete examination. Limited examinations for reoccurring indications may be performed as noted. The reflux portion of the exam is performed with the patient in reverse Trendelenburg.  +---------+---------------+---------+-----------+----------+--------------+ RIGHT    CompressibilityPhasicitySpontaneityPropertiesThrombus Aging +---------+---------------+---------+-----------+----------+--------------+ CFV      Full           Yes      Yes                                 +---------+---------------+---------+-----------+----------+--------------+ SFJ      Full                                                        +---------+---------------+---------+-----------+----------+--------------+ FV Prox  Full                                                        +---------+---------------+---------+-----------+----------+--------------+ FV Mid   Full                                                        +---------+---------------+---------+-----------+----------+--------------+ FV DistalFull                                                        +---------+---------------+---------+-----------+----------+--------------+ PFV      Full                                                        +---------+---------------+---------+-----------+----------+--------------+ POP      Full            Yes      Yes                                 +---------+---------------+---------+-----------+----------+--------------+ PTV      Full                                                        +---------+---------------+---------+-----------+----------+--------------+  PERO     Full                                                        +---------+---------------+---------+-----------+----------+--------------+   +---------+---------------+---------+-----------+----------+--------------+ LEFT     CompressibilityPhasicitySpontaneityPropertiesThrombus Aging +---------+---------------+---------+-----------+----------+--------------+ CFV      Full           Yes      Yes                                 +---------+---------------+---------+-----------+----------+--------------+ SFJ      Full                                                        +---------+---------------+---------+-----------+----------+--------------+ FV Prox  Full                                                        +---------+---------------+---------+-----------+----------+--------------+ FV Mid   Full                                                        +---------+---------------+---------+-----------+----------+--------------+ FV DistalFull                                                        +---------+---------------+---------+-----------+----------+--------------+ PFV      Full                                                        +---------+---------------+---------+-----------+----------+--------------+ POP      Full           Yes      Yes                                 +---------+---------------+---------+-----------+----------+--------------+ PTV      Full                                                        +---------+---------------+---------+-----------+----------+--------------+ PERO     Full                                                         +---------+---------------+---------+-----------+----------+--------------+  Summary: RIGHT: - There is no evidence of deep vein thrombosis in the lower extremity.  - No cystic structure found in the popliteal fossa.  LEFT: - There is no evidence of deep vein thrombosis in the lower extremity.  - No cystic structure found in the popliteal fossa.  *See table(s) above for measurements and observations. Electronically signed by Jamelle Haring on 02/16/2020 at 5:03:07 PM.    Final    CT ANGIO HEAD CODE STROKE  Result Date: 02/15/2020 CLINICAL DATA:  Speech difficulty and left-sided weakness. EXAM: CT ANGIOGRAPHY HEAD AND NECK TECHNIQUE: Multidetector CT imaging of the head and neck was performed using the standard protocol during bolus administration of intravenous contrast. Multiplanar CT image reconstructions and MIPs were obtained to evaluate the vascular anatomy. Carotid stenosis measurements (when applicable) are obtained utilizing NASCET criteria, using the distal internal carotid diameter as the denominator. CONTRAST:  29mL OMNIPAQUE IOHEXOL 350 MG/ML SOLN COMPARISON:  Same day CT head. FINDINGS: CTA NECK FINDINGS Aortic arch: Calcific atherosclerosis of the aorta. Great vessel origins are patent. Right carotid system: No evidence of dissection, stenosis (50% or greater) or occlusion. Left carotid system: No evidence of dissection, stenosis (50% or greater) or occlusion. Vertebral arteries: Left dominant. The right vertebral artery is diminutive throughout its course. No evidence of significant focal stenosis in the neck. Skeleton: Mild multilevel degenerative change of the cervical spine. Other neck: Enlarged, heterogeneous thyroid with numerous nodules and retrosternal extension. Upper chest: Paraseptal and centrilobular emphysema. No focal consolidation. Review of the MIP images confirms the above findings CTA HEAD FINDINGS Anterior circulation: No significant stenosis, proximal occlusion, aneurysm,  or vascular malformation. 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 The left intradural vertebral artery is patent and without evidence of significant stenosis. Diminutive vertebrobasilar system with bilateral fetal type PCAs. Bilateral posterior cerebral arteries are patent without proximal hemodynamically significant stenosis. Evaluation of the distal PCAs is limited by venous contamination. Venous sinuses: As permitted by contrast timing, patent. Review of the MIP images confirms the above findings IMPRESSION: 1. 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 2. Otherwise, no large vessel occlusion or proximal hemodynamically significant stenosis in the head or neck 3. Diminutive vertebrobasilar system intracranially with bilateral fetal type PCAs, anatomic variant. 4. Enlarged, heterogeneous thyroid with numerous nodules and retrosternal extension. This may reflect goiter; however, recommend thyroid ultrasound to further evaluate. 5. Paraseptal and centrilobular emphysema. Findings were communicated on 02/15/2020 at 1:45 pm to provider Dr. Theda Sers Via telephone, who verbally acknowledged these results. Electronically Signed   By: Margaretha Sheffield MD   On: 02/15/2020 14:00   CT ANGIO NECK CODE STROKE  Result Date: 02/15/2020 CLINICAL DATA:  Speech difficulty and left-sided weakness. EXAM: CT ANGIOGRAPHY HEAD AND NECK TECHNIQUE: Multidetector CT imaging of the head and neck was performed using the standard protocol during bolus administration of intravenous contrast. Multiplanar CT image reconstructions and MIPs were obtained to evaluate the vascular anatomy. Carotid stenosis measurements (when applicable) are obtained utilizing NASCET criteria, using the distal internal carotid diameter as the denominator. CONTRAST:  76mL OMNIPAQUE IOHEXOL 350 MG/ML SOLN COMPARISON:  Same day CT head.  FINDINGS: CTA NECK FINDINGS Aortic arch: Calcific atherosclerosis of the aorta. Great vessel origins are patent. Right carotid system: No evidence of dissection, stenosis (50% or greater) or occlusion. Left carotid system: No evidence of dissection, stenosis (50% or greater) or occlusion. Vertebral arteries: Left dominant. The right vertebral artery is  diminutive throughout its course. No evidence of significant focal stenosis in the neck. Skeleton: Mild multilevel degenerative change of the cervical spine. Other neck: Enlarged, heterogeneous thyroid with numerous nodules and retrosternal extension. Upper chest: Paraseptal and centrilobular emphysema. No focal consolidation. Review of the MIP images confirms the above findings CTA HEAD FINDINGS Anterior circulation: No significant stenosis, proximal occlusion, aneurysm, or vascular malformation. 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 The left intradural vertebral artery is patent and without evidence of significant stenosis. Diminutive vertebrobasilar system with bilateral fetal type PCAs. Bilateral posterior cerebral arteries are patent without proximal hemodynamically significant stenosis. Evaluation of the distal PCAs is limited by venous contamination. Venous sinuses: As permitted by contrast timing, patent. Review of the MIP images confirms the above findings IMPRESSION: 1. 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 2. Otherwise, no large vessel occlusion or proximal hemodynamically significant stenosis in the head or neck 3. Diminutive vertebrobasilar system intracranially with bilateral fetal type PCAs, anatomic variant. 4. Enlarged, heterogeneous thyroid with numerous nodules and retrosternal extension. This may reflect goiter; however, recommend thyroid ultrasound to further evaluate. 5. Paraseptal and centrilobular  emphysema. Findings were communicated on 02/15/2020 at 1:45 pm to provider Dr. Theda Sers Via telephone, who verbally acknowledged these results. Electronically Signed   By: Margaretha Sheffield MD   On: 02/15/2020 14:00     Labs:   Basic Metabolic Panel: Recent Labs  Lab 02/15/20 1325 02/15/20 1333 02/16/20 0500 02/19/20 0218  NA 139 139 137 137  K 4.4 3.9 3.7 3.6  CL 102 102 101 102  CO2 25  --  24 23  GLUCOSE 104* 104* 92 83  BUN 9 10 10 11   CREATININE 1.00 0.90 0.98 0.85  CALCIUM 9.6  --  8.8* 8.4*   GFR Estimated Creatinine Clearance: 100.5 mL/min (by C-G formula based on SCr of 0.85 mg/dL). Liver Function Tests: Recent Labs  Lab 02/15/20 1325 02/16/20 0500  AST 33 24  ALT 18 17  ALKPHOS 106 95  BILITOT 1.5* 1.5*  PROT 6.7 6.3*  ALBUMIN 3.6 3.5   No results for input(s): LIPASE, AMYLASE in the last 168 hours. No results for input(s): AMMONIA in the last 168 hours. Coagulation profile Recent Labs  Lab 02/15/20 1325  INR 0.9    CBC: Recent Labs  Lab 02/15/20 1325 02/15/20 1333 02/16/20 0500 02/19/20 0218  WBC 9.7  --  13.9* 9.8  NEUTROABS 6.3  --   --   --   HGB 18.3* 18.4* 16.9 16.4  HCT 55.2* 54.0* 48.6 49.3  MCV 86.4  --  85.7 86.5  PLT 192  --  170 160   Cardiac Enzymes: Recent Labs  Lab 02/15/20 1815  CKTOTAL 62   BNP: Invalid input(s): POCBNP CBG: Recent Labs  Lab 02/15/20 1408 02/20/20 1506  GLUCAP 96 114*   D-Dimer No results for input(s): DDIMER in the last 72 hours. Hgb A1c No results for input(s): HGBA1C in the last 72 hours. Lipid Profile No results for input(s): CHOL, HDL, LDLCALC, TRIG, CHOLHDL, LDLDIRECT in the last 72 hours. Thyroid function studies No results for input(s): TSH, T4TOTAL, T3FREE, THYROIDAB in the last 72 hours.  Invalid input(s): FREET3 Anemia work up No results for input(s): VITAMINB12, FOLATE, FERRITIN, TIBC, IRON, RETICCTPCT in the last 72 hours. Microbiology Recent Results (from the past 240 hour(s))   SARS CORONAVIRUS 2 (TAT 6-24 HRS) Nasopharyngeal Nasopharyngeal Swab     Status:  None   Collection Time: 02/15/20  2:26 PM   Specimen: Nasopharyngeal Swab  Result Value Ref Range Status   SARS Coronavirus 2 NEGATIVE NEGATIVE Final    Comment: (NOTE) SARS-CoV-2 target nucleic acids are NOT DETECTED.  The SARS-CoV-2 RNA is generally detectable in upper and lower respiratory specimens during the acute phase of infection. Negative results do not preclude SARS-CoV-2 infection, do not rule out co-infections with other pathogens, and should not be used as the sole basis for treatment or other patient management decisions. Negative results must be combined with clinical observations, patient history, and epidemiological information. The expected result is Negative.  Fact Sheet for Patients: SugarRoll.be  Fact Sheet for Healthcare Providers: https://www.woods-mathews.com/  This test is not yet approved or cleared by the [place] FDA and  has been authorized for detection and/or diagnosis of SARS-CoV-2 by FDA under an Emergency Use Authorization (EUA). This EUA will remain  in effect (meaning this test can be used) for the duration of the COVID-19 declaration under Se ction 564(b)(1) of the Act, 21 U.S.C. section 360bbb-3(b)(1), unless the authorization is terminated or revoked sooner.  Performed at [facility], [place] [address], [place], [place] [address]      Discharge Instructions:   Discharge Instructions    Ambulatory referral to Neurology   Complete by: As directed    Follow up with stroke clinic NP (Jessica Vanschaick or Cecille Rubin, if both not available, consider Zachery Dauer, or Ahern) at [facility] in about 4 weeks. Thanks.   Discharge instructions   Complete by: As directed    DYS 1 honey thick   Increase activity slowly   Complete by: As directed      Allergies as of 02/22/2020   No Known Allergies      Medication List    STOP taking these medications   budesonide-formoterol 80-4.5 MCG/ACT inhaler Commonly known as: SYMBICORT   cyclobenzaprine 5 MG tablet Commonly known as: FLEXERIL   gabapentin 100 MG capsule Commonly known as: NEURONTIN   naproxen sodium 220 MG tablet Commonly known as: ALEVE   OVER THE COUNTER MEDICATION   oxyCODONE-acetaminophen 5-325 MG tablet Commonly known as: Percocet   traMADol 50 MG tablet Commonly known as: ULTRAM     TAKE these medications   albuterol 108 (90 Base) MCG/ACT inhaler Commonly known as: VENTOLIN HFA INHALE 2 PUFFS INTO THE LUNGS EVERY 6 HOURS AS NEEDED What changed: See the new instructions.   aspirin EC 81 MG tablet Take 81 mg by mouth daily.   atorvastatin 80 MG tablet Commonly known as: LIPITOR Take 1 tablet (80 mg total) by mouth daily.   clopidogrel 75 MG tablet Commonly known as: PLAVIX Take 1 tablet (75 mg total) by mouth daily.   melatonin 3 MG Tabs tablet Take 1 tablet (3 mg total) by mouth at bedtime.   methimazole 5 MG tablet Commonly known as: TAPAZOLE TAKE 1 TABLET(5 MG) BY MOUTH DAILY What changed: See the new instructions.   nicotine 21 mg/24hr patch Commonly known as: NICODERM CQ - dosed in mg/24 hours Place 1 patch (21 mg total) onto the skin daily.   ondansetron 4 MG tablet Commonly known as: ZOFRAN Take 1 tablet (4 mg total) by mouth every 6 (six) hours as needed for nausea or vomiting.   pantoprazole 40 MG tablet Commonly known as: PROTONIX TAKE 1 TABLET BY MOUTH EVERY DAY What changed:   when to take this  reasons to take this   Resource ThickenUp  Clear Powd Honey thick liquids   tamsulosin 0.4 MG Caps capsule Commonly known as: FLOMAX Take 0.4 mg by mouth daily.   traZODone 50 MG tablet Commonly known as: DESYREL Take 50 mg by mouth at bedtime.       Follow-up Information    [facility]. Schedule an appointment as soon as possible for a visit in 4  week(s).   Specialty: Neurology Contact information: [facility].   Why: on 2/22 at 0920 for post loop recorder wound check Contact information: [place] [address] [phone]               Time coordinating discharge: 35 min  Signed:  Geradine Girt DO  [facility] 02/22/2020, 7:39 AM

## 2020-02-23 ENCOUNTER — Encounter (HOSPITAL_COMMUNITY): Payer: Self-pay | Admitting: Cardiology

## 2020-02-23 ENCOUNTER — Inpatient Hospital Stay (HOSPITAL_COMMUNITY): Payer: Medicare Other

## 2020-02-23 MED ORDER — IPRATROPIUM-ALBUTEROL 0.5-2.5 (3) MG/3ML IN SOLN: 3.0000 mL | RESPIRATORY_TRACT | Status: DC | PRN

## 2020-02-23 NOTE — Progress Notes (Signed)
Inpatient Rehab Admissions Coordinator:   I have no beds available for this patient to admit to CIR today.  Will continue to follow for timing of potential admission pending bed availability.   Shann Medal, PT, DPT Admissions Coordinator [phone] 02/23/20  2:09 PM

## 2020-02-23 NOTE — Progress Notes (Signed)
Occupational Therapy Treatment Patient Details Name: David Hendricks MRN: [id number] DOB: 1950-11-19 Today's Date: 02/23/2020    History of present illness Pt is a 70 y/o male admitted secondary to slurred speech and L sided weakness. 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 PMH includes tobacco use, CAD, COPD, anxiety, and depression.   OT comments  Pt making steady progress towards OT goals this session. Session focus on table to visual activities, cognition and functional mobility. Pt continues to present with cognitive deficits, decreased activity tolerance, impaired LUE motor planning/ coordination and visual deficits. Pt able to complete table top visual activities with overall MOD A to sequence task but once step by step directions provided pt able to carryover, see vision section for more info. Pt on RA during session with sats >92% during household distance functional mobility with RW and MIN A, pt needed cues needed for safety awareness in relation to RW mgmt as pt wanting to push RW in front of him. Pt would continue to benefit from skilled occupational therapy while admitted and after d/c to address the below listed limitations in order to improve overall functional mobility and facilitate independence with BADL participation. DC plan remains appropriate, will follow acutely per POC.     Follow Up Recommendations  CIR    Equipment Recommendations  3 in 1 bedside commode;Wheelchair (measurements OT);Wheelchair cushion (measurements OT)    Recommendations for Other Services      Precautions / Restrictions Precautions Precautions: Fall;Other (comment) Precaution Comments: Impulsive Restrictions Weight Bearing Restrictions: No       Mobility Bed Mobility Overal bed mobility: Needs Assistance Bed Mobility: Supine to Sit     Supine to sit: Min assist;HOB elevated     General bed mobility comments: light MIN A to elevate trunk  Transfers Overall transfer level: Needs  assistance Equipment used: Rolling walker (2 wheeled) Transfers: Sit to/from Stand Sit to Stand: Min guard         General transfer comment: Close guard for safety as pt powered up to full standing position. Pt demonstrated proper hand placement on seated surface.    Balance Overall balance assessment: Needs assistance Sitting-balance support: Feet supported;No upper extremity supported Sitting balance-Leahy Scale: Fair Sitting balance - Comments: min guard for safety   Standing balance support: Bilateral upper extremity supported;During functional activity Standing balance-Leahy Scale: Fair Standing balance comment: benefits from BUE support                           ADL either performed or assessed with clinical judgement   ADL Overall ADL's : Needs assistance/impaired             Lower Body Bathing: Supervison/ safety;Set up Lower Body Bathing Details (indicate cue type and reason): simulated via LB dressing from long sitting in bed Upper Body Dressing : Moderate assistance;Sitting Upper Body Dressing Details (indicate cue type and reason): donning gown Lower Body Dressing: Set up;Supervision/safety;Bed level Lower Body Dressing Details (indicate cue type and reason): able to don socks from long sitting in bed Toilet Transfer: Minimal assistance;RW;Ambulation Toilet Transfer Details (indicate cue type and reason): simulated via functional mobility with RW and MIN A         Functional mobility during ADLs: Minimal assistance;Rolling walker;Cueing for safety General ADL Comments: pt continues to present with cognitive deficits, decreased activity tolerance, impaired LUE motor planning/ coordination and visual deficits     Vision Baseline Vision/History: Wears glasses Wears Glasses: Reading  only Patient Visual Report: Other (comment);Blurring of vision (today,pt reprots blurry vision at midline during table top tasks) Additional Comments: pt completed x3  table top visual activities. activity number one involved tracking in busy environment, pt noted to lose track at midline. activity number two instructed pt to rewrite numbers written on L side of paper on R side of paper, pt did great with this activity able to complete with 100% accuracy. activity number 3 involved pt locating the repeating 3 numbers from a set of 13 numbers. pt requried step by step cues to follow directions but again noted pt to skip over numbers in middle of paper- feel alot of pts visual deficits may be attrbuted to needing new glasses.   Perception     Praxis      Cognition Arousal/Alertness: Awake/alert Behavior During Therapy: Impulsive Overall Cognitive Status: Impaired/Different from baseline Area of Impairment: Orientation;Attention;Memory;Following commands;Safety/judgement;Awareness;Problem solving                 Orientation Level: Disoriented to;Time (increased time to problem solve that today was valentines day) Current Attention Level: Sustained Memory: Decreased short-term memory Following Commands: Follows one step commands with increased time;Follows multi-step commands inconsistently Safety/Judgement: Decreased awareness of safety;Decreased awareness of deficits Awareness: Intellectual Problem Solving: Slow processing;Difficulty sequencing;Requires verbal cues General Comments: when completing cognitive visual table top task pt initially requried step by step cues to problem solve through task. but onse stey by step directions were given pt able to carryover directions. pt noted to get distracted during task by extraneous noise or objects on table, continue to impulsive needing cues for safety awareness such as sitting back prematurely into recliner        Exercises     Shoulder Instructions       General Comments pt on RA during session with sats >92% during mobility    Pertinent Vitals/ Pain       Pain Assessment: Faces Faces Pain  Scale: Hurts a little bit Pain Location: L side of pts neck Pain Descriptors / Indicators: Sore Pain Intervention(s): Monitored during session  Home Living                                          Prior Functioning/Environment              Frequency  Min 2X/week        Progress Toward Goals  OT Goals(current goals can now be found in the care plan section)  Progress towards OT goals: Progressing toward goals  Acute Rehab OT Goals Patient Stated Goal: wanted to get to chair OT Goal Formulation: With patient Time For Goal Achievement: 03/02/20 Potential to Achieve Goals: Good  Plan Discharge plan remains appropriate;Frequency remains appropriate    Co-evaluation                 AM-PAC OT "6 Clicks" Daily Activity     Outcome Measure   Help from another person eating meals?: None Help from another person taking care of personal grooming?: A Little Help from another person toileting, which includes using toliet, bedpan, or urinal?: A Little Help from another person bathing (including washing, rinsing, drying)?: A Little Help from another person to put on and taking off regular upper body clothing?: A Little Help from another person to put on and taking off regular lower body clothing?: A Little 6 Click Score:  19    End of Session Equipment Utilized During Treatment: Gait belt;Rolling walker  OT Visit Diagnosis: Unsteadiness on feet (R26.81);Muscle weakness (generalized) (M62.81);Other symptoms and signs involving cognitive function;Hemiplegia and hemiparesis Hemiplegia - Right/Left: Left Hemiplegia - dominant/non-dominant: Dominant Hemiplegia - caused by: Cerebral infarction   Activity Tolerance Patient tolerated treatment well   Patient Left in chair;with call bell/phone within reach;with chair alarm set   Nurse Communication Mobility status        Time: 5041-3643 OT Time Calculation (min): 33 min  Charges: OT General  Charges $OT Visit: 1 Visit OT Treatments $Self Care/Home Management : 8-22 mins $Therapeutic Activity: 8-22 mins  Harley Alto., COTA/L Acute Rehabilitation Services [phone]    Precious Haws 02/23/2020, 5:05 PM

## 2020-02-23 NOTE — Progress Notes (Signed)
Patient stable for d/c to CIR, await bed.  Please see D/c summary from 2/13.  Had a cough this AM and checked x ray w/o findings of PNA. Eulogio Bear DO

## 2020-02-23 NOTE — Progress Notes (Signed)
Physical Therapy Treatment Patient Details Name: David Hendricks MRN: [id number] DOB: 1950/07/12 Today's Date: 02/23/2020    History of Present Illness Pt is a 70 y/o male admitted secondary to slurred speech and L sided weakness. 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 PMH includes tobacco use, CAD, COPD, anxiety, and depression.    PT Comments    Pt progressing towards physical therapy goals. Was able to perform transfers and ambulation with up to min assist and RW for support. Cognition continues to be a limiting factor, especially in regards to safety awareness. Overall, feel this patient is an excellent CIR candidate and would thrive in the higher intensity environment of CIR. Will continue to follow and progress as able per POC.    Follow Up Recommendations  CIR;Supervision/Assistance - 24 hour     Equipment Recommendations  Rolling walker with 5" wheels;3in1 (PT);Wheelchair (measurements PT);Wheelchair cushion (measurements PT)    Recommendations for Other Services       Precautions / Restrictions Precautions Precautions: Fall;Other (comment) Precaution Comments: Impulsive Restrictions Weight Bearing Restrictions: No    Mobility  Bed Mobility               General bed mobility comments: Pt was received sitting up in the recliner.    Transfers Overall transfer level: Needs assistance Equipment used: Rolling walker (2 wheeled) Transfers: Sit to/from Stand Sit to Stand: Min guard         General transfer comment: Close guard for safety as pt powered up to full standing position. Pt demonstrated proper hand placement on seated surface.  Ambulation/Gait Ambulation/Gait assistance: Min assist Gait Distance (Feet): 100 Feet Assistive device: Rolling walker (2 wheeled);1 person hand held assist Gait Pattern/deviations: Step-through pattern;Trunk flexed;Decreased stride length;Decreased weight shift to left;Decreased dorsiflexion - left Gait velocity:  Decreased but able to slightly improve with VC's. Gait velocity interpretation: 1.31 - 2.62 ft/sec, indicative of limited community ambulator General Gait Details: VC's for improved posture, closer walker proximity, and forwards gaze. Increased processing time required to acknowledge upcoming obstacles and make corrective changes to his path to avoid them. Initially without RW, and pt required min guard assist progressing to min assist as pt fatigued. He complained of L calf pain and pt was given RW. Again, pt did well initially, requiring min assist as pt fatigued.   Stairs             Wheelchair Mobility    Modified Rankin (Stroke Patients Only) Modified Rankin (Stroke Patients Only) Pre-Morbid Rankin Score: No symptoms Modified Rankin: Moderately severe disability     Balance Overall balance assessment: Needs assistance Sitting-balance support: Feet supported;No upper extremity supported Sitting balance-Leahy Scale: Fair Sitting balance - Comments: min guard for safety Postural control: Posterior lean;Left lateral lean Standing balance support: Bilateral upper extremity supported;During functional activity Standing balance-Leahy Scale: Fair Standing balance comment: benefits from BUE support                            Cognition Arousal/Alertness: Awake/alert Behavior During Therapy: Impulsive Overall Cognitive Status: Impaired/Different from baseline Area of Impairment: Orientation;Attention;Memory;Following commands;Safety/judgement;Awareness;Problem solving                 Orientation Level: Disoriented to;Time (It took pt increased time and cues to orient to the date, and why there was a flower on his meal tray (Valentine's Day)) Current Attention Level: Sustained Memory: Decreased short-term memory Following Commands: Follows one step commands with increased time;Follows  one step commands consistently;Follows multi-step commands  inconsistently Safety/Judgement: Decreased awareness of safety;Decreased awareness of deficits Awareness: Intellectual Problem Solving: Slow processing;Difficulty sequencing;Requires verbal cues General Comments: Decreased safety awareness. Prior to session, pt got up and somehow unlocked the chair to angle it towards the TV. RN reports she definitely locked the chair after she assisted him OOB. He left the chair unlocked which rolled back as he was attempting to impulsively stand.      Exercises      General Comments        Pertinent Vitals/Pain Pain Assessment: Faces Faces Pain Scale: Hurts little more Pain Location: L calf Pain Descriptors / Indicators: Cramping Pain Intervention(s): Limited activity within patient's tolerance;Monitored during session;Repositioned    Home Living                      Prior Function            PT Goals (current goals can now be found in the care plan section) Acute Rehab PT Goals Patient Stated Goal: none stated PT Goal Formulation: With patient Time For Goal Achievement: 03/01/20 Potential to Achieve Goals: Fair Progress towards PT goals: Progressing toward goals    Frequency    Min 3X/week      PT Plan Current plan remains appropriate    Co-evaluation              AM-PAC PT "6 Clicks" Mobility   Outcome Measure  Help needed turning from your back to your side while in a flat bed without using bedrails?: A Little Help needed moving from lying on your back to sitting on the side of a flat bed without using bedrails?: A Little Help needed moving to and from a bed to a chair (including a wheelchair)?: A Little Help needed standing up from a chair using your arms (e.g., wheelchair or bedside chair)?: A Little Help needed to walk in hospital room?: A Little Help needed climbing 3-5 steps with a railing? : A Lot 6 Click Score: 17    End of Session Equipment Utilized During Treatment: Gait belt Activity Tolerance:  Patient tolerated treatment well Patient left: in chair;with call bell/phone within reach;with chair alarm set Nurse Communication: Mobility status PT Visit Diagnosis: Unsteadiness on feet (R26.81);Muscle weakness (generalized) (M62.81);Difficulty in walking, not elsewhere classified (R26.2);Hemiplegia and hemiparesis Hemiplegia - Right/Left: Left Hemiplegia - caused by: Cerebral infarction     Time: 9528-4132 PT Time Calculation (min) (ACUTE ONLY): 26 min  Charges:  $Gait Training: 23-37 mins                     Rolinda Roan, PT, DPT Acute Rehabilitation Services Pager: [phone] Office: [phone]    Thelma Comp 02/23/2020, 11:48 AM

## 2020-02-24 ENCOUNTER — Inpatient Hospital Stay (HOSPITAL_COMMUNITY)
Admission: RE | Admit: 2020-02-24 | Discharge: 2020-03-09 | DRG: 057 | Disposition: A | Discharge status: Home Health | Payer: Medicare Other | Source: Intra-hospital | Attending: Physical Medicine and Rehabilitation | Admitting: Physical Medicine and Rehabilitation

## 2020-02-24 ENCOUNTER — Encounter (HOSPITAL_COMMUNITY): Payer: Self-pay | Admitting: Physical Medicine and Rehabilitation

## 2020-02-24 ENCOUNTER — Other Ambulatory Visit: Payer: Self-pay

## 2020-02-24 DIAGNOSIS — K219 Gastro-esophageal reflux disease without esophagitis: Secondary | ICD-10-CM | POA: Diagnosis present

## 2020-02-24 DIAGNOSIS — H538 Other visual disturbances: Secondary | ICD-10-CM | POA: Diagnosis present

## 2020-02-24 DIAGNOSIS — R1312 Dysphagia, oropharyngeal phase: Secondary | ICD-10-CM | POA: Diagnosis present

## 2020-02-24 DIAGNOSIS — I69354 Hemiplegia and hemiparesis following cerebral infarction affecting left non-dominant side: Principal | ICD-10-CM

## 2020-02-24 DIAGNOSIS — R3915 Urgency of urination: Secondary | ICD-10-CM | POA: Diagnosis present

## 2020-02-24 DIAGNOSIS — Z7982 Long term (current) use of aspirin: Secondary | ICD-10-CM

## 2020-02-24 DIAGNOSIS — I2510 Atherosclerotic heart disease of native coronary artery without angina pectoris: Secondary | ICD-10-CM | POA: Diagnosis present

## 2020-02-24 DIAGNOSIS — Z716 Tobacco abuse counseling: Secondary | ICD-10-CM

## 2020-02-24 DIAGNOSIS — I63511 Cerebral infarction due to unspecified occlusion or stenosis of right middle cerebral artery: Secondary | ICD-10-CM | POA: Diagnosis present

## 2020-02-24 DIAGNOSIS — J441 Chronic obstructive pulmonary disease with (acute) exacerbation: Secondary | ICD-10-CM

## 2020-02-24 DIAGNOSIS — J449 Chronic obstructive pulmonary disease, unspecified: Secondary | ICD-10-CM | POA: Diagnosis present

## 2020-02-24 DIAGNOSIS — Z7902 Long term (current) use of antithrombotics/antiplatelets: Secondary | ICD-10-CM | POA: Diagnosis not present

## 2020-02-24 DIAGNOSIS — K5900 Constipation, unspecified: Secondary | ICD-10-CM | POA: Diagnosis present

## 2020-02-24 DIAGNOSIS — I69392 Facial weakness following cerebral infarction: Secondary | ICD-10-CM | POA: Diagnosis not present

## 2020-02-24 DIAGNOSIS — F419 Anxiety disorder, unspecified: Secondary | ICD-10-CM | POA: Diagnosis not present

## 2020-02-24 DIAGNOSIS — E785 Hyperlipidemia, unspecified: Secondary | ICD-10-CM | POA: Diagnosis present

## 2020-02-24 DIAGNOSIS — Z9049 Acquired absence of other specified parts of digestive tract: Secondary | ICD-10-CM | POA: Diagnosis not present

## 2020-02-24 DIAGNOSIS — E0590 Thyrotoxicosis, unspecified without thyrotoxic crisis or storm: Secondary | ICD-10-CM | POA: Diagnosis present

## 2020-02-24 DIAGNOSIS — I700 Atherosclerosis of aorta: Secondary | ICD-10-CM | POA: Diagnosis present

## 2020-02-24 DIAGNOSIS — Z87442 Personal history of urinary calculi: Secondary | ICD-10-CM

## 2020-02-24 DIAGNOSIS — Z79890 Hormone replacement therapy (postmenopausal): Secondary | ICD-10-CM | POA: Diagnosis not present

## 2020-02-24 DIAGNOSIS — Z955 Presence of coronary angioplasty implant and graft: Secondary | ICD-10-CM | POA: Diagnosis not present

## 2020-02-24 DIAGNOSIS — L299 Pruritus, unspecified: Secondary | ICD-10-CM | POA: Diagnosis not present

## 2020-02-24 DIAGNOSIS — I69391 Dysphagia following cerebral infarction: Secondary | ICD-10-CM

## 2020-02-24 DIAGNOSIS — B370 Candidal stomatitis: Secondary | ICD-10-CM | POA: Diagnosis present

## 2020-02-24 DIAGNOSIS — F17210 Nicotine dependence, cigarettes, uncomplicated: Secondary | ICD-10-CM | POA: Diagnosis present

## 2020-02-24 DIAGNOSIS — Z79899 Other long term (current) drug therapy: Secondary | ICD-10-CM

## 2020-02-24 DIAGNOSIS — J410 Simple chronic bronchitis: Secondary | ICD-10-CM

## 2020-02-24 DIAGNOSIS — R252 Cramp and spasm: Secondary | ICD-10-CM | POA: Diagnosis present

## 2020-02-24 DIAGNOSIS — M25511 Pain in right shoulder: Secondary | ICD-10-CM | POA: Diagnosis present

## 2020-02-24 DIAGNOSIS — F32A Depression, unspecified: Secondary | ICD-10-CM | POA: Diagnosis not present

## 2020-02-24 DIAGNOSIS — L740 Miliaria rubra: Secondary | ICD-10-CM | POA: Diagnosis present

## 2020-02-24 DIAGNOSIS — I6300 Cerebral infarction due to thrombosis of unspecified precerebral artery: Secondary | ICD-10-CM

## 2020-02-24 DIAGNOSIS — K117 Disturbances of salivary secretion: Secondary | ICD-10-CM | POA: Diagnosis not present

## 2020-02-24 DIAGNOSIS — N400 Enlarged prostate without lower urinary tract symptoms: Secondary | ICD-10-CM | POA: Diagnosis present

## 2020-02-24 DIAGNOSIS — Z95818 Presence of other cardiac implants and grafts: Secondary | ICD-10-CM

## 2020-02-24 DIAGNOSIS — E039 Hypothyroidism, unspecified: Secondary | ICD-10-CM | POA: Diagnosis present

## 2020-02-24 DIAGNOSIS — I252 Old myocardial infarction: Secondary | ICD-10-CM

## 2020-02-24 DIAGNOSIS — I69322 Dysarthria following cerebral infarction: Secondary | ICD-10-CM

## 2020-02-24 DIAGNOSIS — R339 Retention of urine, unspecified: Secondary | ICD-10-CM | POA: Diagnosis not present

## 2020-02-24 DIAGNOSIS — G8929 Other chronic pain: Secondary | ICD-10-CM | POA: Diagnosis present

## 2020-02-24 DIAGNOSIS — R42 Dizziness and giddiness: Secondary | ICD-10-CM | POA: Diagnosis present

## 2020-02-24 MED ORDER — TAMSULOSIN HCL 0.4 MG PO CAPS
0.4000 mg | ORAL_CAPSULE | Freq: Every day | ORAL | Status: DC
  Administered 2020-02-25 – 2020-03-06 (×11): 0.4 mg via ORAL
  Filled 2020-02-24 (×11): qty 1

## 2020-02-24 MED ORDER — FLUCONAZOLE 100 MG PO TABS
200.0000 mg | ORAL_TABLET | Freq: Every day | ORAL | Status: AC
  Administered 2020-02-24 – 2020-03-01 (×7): 200 mg via ORAL
  Filled 2020-02-24 (×7): qty 2

## 2020-02-24 MED ORDER — NICOTINE 21 MG/24HR TD PT24
21.0000 mg | MEDICATED_PATCH | Freq: Every day | TRANSDERMAL | Status: DC
  Administered 2020-02-25 – 2020-03-09 (×14): 21 mg via TRANSDERMAL
  Filled 2020-02-24 (×14): qty 1

## 2020-02-24 MED ORDER — IPRATROPIUM-ALBUTEROL 0.5-2.5 (3) MG/3ML IN SOLN
3.0000 mL | RESPIRATORY_TRACT | Status: DC | PRN
  Administered 2020-02-28: 3 mL via RESPIRATORY_TRACT
  Filled 2020-02-24: qty 3

## 2020-02-24 MED ORDER — CLOPIDOGREL BISULFATE 75 MG PO TABS
75.0000 mg | ORAL_TABLET | Freq: Every day | ORAL | Status: DC
  Administered 2020-02-25 – 2020-03-09 (×14): 75 mg via ORAL
  Filled 2020-02-24 (×14): qty 1

## 2020-02-24 MED ORDER — SENNOSIDES-DOCUSATE SODIUM 8.6-50 MG PO TABS
1.0000 | ORAL_TABLET | Freq: Two times a day (BID) | ORAL | Status: DC
  Administered 2020-02-24 – 2020-03-09 (×28): 1 via ORAL
  Filled 2020-02-24 (×28): qty 1

## 2020-02-24 MED ORDER — ACETAMINOPHEN 325 MG PO TABS
650.0000 mg | ORAL_TABLET | ORAL | Status: DC | PRN
  Administered 2020-02-25 – 2020-03-04 (×5): 650 mg via ORAL
  Filled 2020-02-24 (×4): qty 2

## 2020-02-24 MED ORDER — METHIMAZOLE 5 MG PO TABS
5.0000 mg | ORAL_TABLET | Freq: Every day | ORAL | Status: DC
  Administered 2020-02-25 – 2020-03-09 (×14): 5 mg via ORAL
  Filled 2020-02-24 (×14): qty 1

## 2020-02-24 MED ORDER — ATORVASTATIN CALCIUM 80 MG PO TABS
80.0000 mg | ORAL_TABLET | Freq: Every day | ORAL | Status: DC
  Administered 2020-02-25 – 2020-03-09 (×14): 80 mg via ORAL
  Filled 2020-02-24 (×14): qty 1

## 2020-02-24 MED ORDER — SENNOSIDES-DOCUSATE SODIUM 8.6-50 MG PO TABS: 1.0000 | ORAL_TABLET | Freq: Every evening | ORAL | Status: DC | PRN

## 2020-02-24 MED ORDER — ASPIRIN EC 81 MG PO TBEC
81.0000 mg | DELAYED_RELEASE_TABLET | Freq: Every day | ORAL | Status: AC
  Administered 2020-02-25 – 2020-03-09 (×14): 81 mg via ORAL
  Filled 2020-02-24 (×14): qty 1

## 2020-02-24 MED ORDER — MELATONIN 3 MG PO TABS
3.0000 mg | ORAL_TABLET | Freq: Every day | ORAL | Status: DC
  Administered 2020-02-24 – 2020-03-08 (×14): 3 mg via ORAL
  Filled 2020-02-24 (×14): qty 1

## 2020-02-24 MED ORDER — ACETAMINOPHEN 650 MG RE SUPP: 650.0000 mg | RECTAL | Status: DC | PRN

## 2020-02-24 MED ORDER — PANTOPRAZOLE SODIUM 40 MG PO TBEC
40.0000 mg | DELAYED_RELEASE_TABLET | Freq: Two times a day (BID) | ORAL | Status: DC
  Administered 2020-02-24 – 2020-03-09 (×28): 40 mg via ORAL
  Filled 2020-02-24 (×28): qty 1

## 2020-02-24 MED ORDER — ACETAMINOPHEN 160 MG/5ML PO SOLN: 650.0000 mg | ORAL | Status: DC | PRN

## 2020-02-24 MED ORDER — SALINE SPRAY 0.65 % NA SOLN
1.0000 | NASAL | Status: DC | PRN
  Filled 2020-02-24 (×2): qty 44

## 2020-02-24 MED ORDER — ORAL CARE MOUTH RINSE
15.0000 mL | Freq: Two times a day (BID) | OROMUCOSAL | Status: DC
  Administered 2020-02-24 – 2020-03-09 (×27): 15 mL via OROMUCOSAL

## 2020-02-24 MED ORDER — RESOURCE THICKENUP CLEAR PO POWD
ORAL | Status: DC | PRN
  Filled 2020-02-24 (×3): qty 125

## 2020-02-24 MED ORDER — TRAZODONE HCL 50 MG PO TABS
50.0000 mg | ORAL_TABLET | Freq: Every day | ORAL | Status: DC
  Administered 2020-02-24 – 2020-03-08 (×14): 50 mg via ORAL
  Filled 2020-02-24 (×14): qty 1

## 2020-02-24 NOTE — Progress Notes (Addendum)
Inpatient Rehabilitation Medication Review by a Pharmacist  A complete drug regimen review was completed for this patient to identify any potential clinically significant medication issues.  Clinically significant medication issues were identified:  no   Type of Medication Issue Identified Description of Issue Urgent (address now) Non-Urgent (address on AM team rounds) Plan Plan Accepted by Provider? (Yes / No / Pending AM Rounds)  Drug Interaction(s) (clinically significant)       Duplicate Therapy       Allergy       No Medication Administration End Date  Per neuro recs, plan for ASA + Plavix x 3 weeks, then Plavix alone.  Non-Urgent Added ASA stop date (March 1)   Messaged Helyn Numbers, [place]  Incorrect Dose       Additional Drug Therapy Needed       Other         Name of provider notified for urgent issues identified: Helyn Numbers, rehab PA  Provider Method of Notification: secure chat   For non-urgent medication issues to be resolved on team rounds tomorrow morning a [facility] Secure Chat Handoff was sent to:    Pharmacist comments:   Time spent performing this drug regimen review (minutes):  [place], Pharm.D., BCPS Clinical Pharmacist  02/24/2020 3:06 PM

## 2020-02-24 NOTE — Progress Notes (Signed)
Inpatient Rehabilitation  Patient information reviewed and entered into eRehab system by Hassen Bruun M. Kurt Azimi, M.A., CCC/SLP, PPS Coordinator.  Information including medical coding, functional ability and quality indicators will be reviewed and updated through discharge.    

## 2020-02-24 NOTE — Discharge Summary (Signed)
Please see d/c summary from 2/13.  Patient excited about rehab but would like a shave as well. Eulogio Bear DO

## 2020-02-24 NOTE — Progress Notes (Signed)
Patient transported to 4W5 via bed.

## 2020-02-24 NOTE — Progress Notes (Addendum)
Patient ID: David Hendricks, male   DOB: Jul 02, 1950, 70 y.o.   MRN: [id number] Met with the patient to review role of the nurse CM  Erline Levine) and address educational needs to manage secondary stroke risks and collaborate with the SW to facilitate prep for discharge. Patient lives alone with his dog but was aware that something was wrong and called a friend to watch his dog and get him help for the stroke. Patient with hx of smoking and HLD, provided education on smoking risks to health,  And smoking cessation with dyslipidemia. Patient also provided education on care for the loop recorder and acknowledges an understanding of the rationale for the loop recorder monitoring. Also reviewed DAPT for 3 weeks then plavix solo per MD.  No other questions at present. Continue to follow along to discharge to address questions/educational needs. [name]

## 2020-02-24 NOTE — H&P (Signed)
Physical Medicine and Rehabilitation Admission H&P    No chief complaint on file. : HPI: David Hendricks is a 70 year old right-handed male with history of tobacco abuse, COPD, CAD with stenting maintained on aspirin, hyperlipidemia, hyperthyroidism, BPH anxiety with depression.  Per chart review patient lives alone.  Reportedly independent prior to admission.  1 level apartment.  Presented 02/15/2020 with left-sided weakness and slurred speech.  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  Small focus of susceptibility artifact in this region representing small thrombosed distal cortical MCA branch.  Possible additional punctate infarct in the right parietal cortex versus infarct.  No substantial edema or mass-effect.  CT angiogram of the head and neck nondominant right vertebral artery is diminutive throughout its course and likely occluded intradurally otherwise no large vessel occlusion or proximal hemodynamically significant stenosis.  Admission chemistries unremarkable aside glucose 104 SARS current virus negative hemoglobin 18.3, TSH 0.919.  Urine drug screen negative.  Echocardiogram with ejection fraction of 60 to 65% no wall motion abnormalities grade 1 diastolic dysfunction.  Currently maintained on aspirin and Plavix for CVA prophylaxis x3 weeks then Plavix alone.  Awaiting plan for loop recorder.  Dysphagia #1 honey thick liquid diet.  Therapy evaluations completed due to patient's left-sided weakness and slurred speech was admitted for a comprehensive rehab program.  Pt reports no pain; LBM this AM- was hard and hard to get out.  Also thinks they put some cream on buttocks for skin issues.  Voiding well.    Review of Systems  Constitutional: Negative for chills and fever.  HENT: Negative for hearing loss.   Eyes: Negative for blurred vision and double vision.  Respiratory: Negative for cough and shortness of breath.   Cardiovascular:  Negative for chest pain, palpitations and leg swelling.  Gastrointestinal: Positive for constipation. Negative for heartburn, nausea and vomiting.       GERD  Genitourinary: Positive for urgency. Negative for dysuria, flank pain and hematuria.  Musculoskeletal: Positive for myalgias.  Skin: Negative for rash.  Neurological: Positive for speech change and weakness.       Vertigo  Psychiatric/Behavioral: Positive for depression.       Anxiety  All other systems reviewed and are negative.  Past Medical History:  Diagnosis Date  . Allergy   . Anxiety   . Aortic atherosclerosis ([place])   . Arthritis   . Benign prostatic hyperplasia (BPH) with urinary urgency   . Chronic kidney disease    KIDNEY STONES  . COPD (chronic obstructive pulmonary disease) ([place])    possible   . Coronary artery disease    10/18 PCI/DES to pLAD, mild nonobstructive disease in the Lcx/RCA. Normal EF.   Marland Kitchen Depression   . Dyslipidemia   . Dysrhythmia 2016   irregular heartbeat  . Gallstones   . GERD (gastroesophageal reflux disease)   . History of kidney stones   . Hyperlipidemia   . Hyperthyroidism   . Leg cramps   . Multiple pulmonary nodules   . Myocardial infarction ([place])    2018  . Pneumonia   . Seasonal allergies   . Thyroid nodule   . Vertigo   . Vitamin B 12 deficiency    Past Surgical History:  Procedure Laterality Date  . COLONOSCOPY    . CORONARY ANGIOPLASTY  10/09/2016  . CORONARY STENT INTERVENTION N/A 10/09/2016   Procedure: CORONARY STENT INTERVENTION;  Surgeon: Nelva Bush, MD;  Location: [facility];  Service:  Cardiovascular;  Laterality: N/A;  . CYSTOSCOPY WITH RETROGRADE PYELOGRAM, URETEROSCOPY AND STENT PLACEMENT Left 06/17/2019   Procedure: CYSTOSCOPY WITH RETROGRADE PYELOGRAM, URETEROSCOPY AND STENT PLACEMENT;  Surgeon: Robley Fries, MD;  Location: [facility];  Service: Urology;  Laterality: Left;  1 HR  . HOLMIUM LASER APPLICATION Left 05/11/8411   Procedure: HOLMIUM  LASER APPLICATION;  Surgeon: Robley Fries, MD;  Location: [facility];  Service: Urology;  Laterality: Left;  . INTRAVASCULAR ULTRASOUND/IVUS N/A 10/09/2016   Procedure: Intravascular Ultrasound/IVUS;  Surgeon: Nelva Bush, MD;  Location: [facility];  Service: Cardiovascular;  Laterality: N/A;  . IR CHOLANGIOGRAM EXISTING TUBE  11/18/2018  . IR PERC CHOLECYSTOSTOMY  11/16/2018  . LEFT HEART CATH AND CORONARY ANGIOGRAPHY N/A 10/09/2016   Procedure: LEFT HEART CATH AND CORONARY ANGIOGRAPHY;  Surgeon: Nelva Bush, MD;  Location: [facility];  Service: Cardiovascular;  Laterality: N/A;  . LOOP RECORDER INSERTION N/A 02/20/2020   Procedure: LOOP RECORDER INSERTION;  Surgeon: Vickie Epley, MD;  Location: [facility];  Service: Cardiovascular;  Laterality: N/A;   Family History  Problem Relation Age of Onset  . Alcohol abuse Father        called Toast per pt  . Cancer Father        OAK CELL CANCER PER PT NON SURGICAL   . Colon cancer Neg Hx   . Colon polyps Neg Hx   . Esophageal cancer Neg Hx   . Rectal cancer Neg Hx   . Stomach cancer Neg Hx    Social History:  reports that he has been smoking cigarettes. He started smoking about 59 years ago. He has a 90.00 pack-year smoking history. He has never used smokeless tobacco. He reports that he does not drink alcohol and does not use drugs. Allergies: No Known Allergies Facility-Administered Medications Prior to Admission  Medication Dose Route Frequency Provider Last Rate Last Admin  . cyanocobalamin ((VITAMIN B-12)) injection 1,000 mcg  1,000 mcg Intramuscular Q30 days Horald Pollen, MD   1,000 mcg at 08/29/19 1525   Medications Prior to Admission  Medication Sig Dispense Refill  . aspirin EC 81 MG tablet Take 81 mg by mouth daily.    Marland Kitchen atorvastatin (LIPITOR) 80 MG tablet Take 1 tablet (80 mg total) by mouth daily. 90 tablet 3  . clopidogrel (PLAVIX) 75 MG tablet Take 1 tablet (75 mg total) by  mouth daily.    . Maltodextrin-Xanthan Gum (RESOURCE THICKENUP CLEAR) POWD Honey thick liquids    . melatonin 3 MG TABS tablet Take 1 tablet (3 mg total) by mouth at bedtime.  0  . methimazole (TAPAZOLE) 5 MG tablet TAKE 1 TABLET(5 MG) BY MOUTH DAILY (Patient taking differently: Take 5 mg by mouth daily.) 90 tablet 1  . nicotine (NICODERM CQ - DOSED IN MG/24 HOURS) 21 mg/24hr patch Place 1 patch (21 mg total) onto the skin daily. 28 patch 0  . pantoprazole (PROTONIX) 40 MG tablet TAKE 1 TABLET BY MOUTH EVERY DAY (Patient taking differently: Take 40 mg by mouth daily as needed (heartburn).) 30 tablet 0  . tamsulosin (FLOMAX) 0.4 MG CAPS capsule Take 0.4 mg by mouth daily.     . traZODone (DESYREL) 50 MG tablet Take 50 mg by mouth at bedtime.    Marland Kitchen albuterol (VENTOLIN HFA) 108 (90 Base) MCG/ACT inhaler INHALE 2 PUFFS INTO THE LUNGS EVERY 6 HOURS AS NEEDED (Patient taking differently: Inhale 2 puffs into the lungs every 6 (six)  hours as needed for wheezing or shortness of breath.) 18 g 5  . ondansetron (ZOFRAN) 4 MG tablet Take 1 tablet (4 mg total) by mouth every 6 (six) hours as needed for nausea or vomiting. 20 tablet 0    Drug Regimen Review Drug regimen was reviewed and remains appropriate with no significant issues identified  Home: Home Living Family/patient expects to be discharged to:: Private residence Living Arrangements: Alone   Functional History:    Functional Status:  Mobility:          ADL:    Cognition: Cognition Orientation Level: Oriented X4    Physical Exam: Blood pressure 122/65, pulse 76, temperature 98.2 F (36.8 C), temperature source Oral, resp. rate 18, height 6\' 1"  (1.854 m), weight 99.5 kg, SpO2 92 %. Physical Exam Vitals and nursing note reviewed.  Constitutional:      Comments: Pt just woke up; laying in bed; dysarthric, but can understand him, drooling occ from L side of mouth, appropriate, NAD  HENT:     Head: Normocephalic and atraumatic.      Comments: L facial droop- tongue midline; tongue severely coated with thick white material and erythematous- c/w possible thrush- no specific plaques seen inside oropharynx other than tongue    Right Ear: External ear normal.     Left Ear: External ear normal.     Nose: Nose normal.     Mouth/Throat:     Mouth: Mucous membranes are dry.     Pharynx: Oropharyngeal exudate and posterior oropharyngeal erythema present.  Eyes:     Extraocular Movements: Extraocular movements intact.     Comments: Sclera B/L R>L erythematous and slightly muddy; no nystagmus seen- squinting some  Cardiovascular:     Rate and Rhythm: Normal rate and regular rhythm.     Heart sounds: Normal heart sounds. No murmur heard. No gallop.   Pulmonary:     Comments: CTA B/L- no W/R/R- good air movement Abdominal:     Comments: Soft, NT, ND, (+)BS -normoactive  Musculoskeletal:     Cervical back: Normal range of motion and neck supple.     Comments: RUE 5/5 in biceps, triceps, WE, grip and finger abd LUE- 5-/5 except finger abd 4+/5 RLE- 5/5 in JF, KE, DF and PF LLE- 5-/5 in same muscles   Skin:    General: Skin is warm and dry.     Comments: Scattered ecchymoses on arms with a few skin tears and bruising esp on chest c/w loop recorder IV L forearm- looks OK Mild not raise, slightly erythematous- nonitchy rash on R buttock- heat rash?  Neurological:     Comments: Patient is alert in no acute distress.  Speech is mildly dysarthric but intelligible.  Left facial droop.  Oriented x3 and follows commands.  Fair insight and awareness Ox3- c/o blurry vision (doesn't have eyeglasses) Light touch intact x 4 extremities  Psychiatric:     Comments: Joking occ; appropriate     No results found for this or any previous visit (from the past 48 hour(s)). DG CHEST PORT 1 VIEW  Result Date: 02/23/2020 CLINICAL DATA:  Hypoxia. EXAM: PORTABLE CHEST 1 VIEW COMPARISON:  02/15/2020 and CT chest 11/15/2018. FINDINGS: Trachea is  midline. Heart size normal. Thoracic aorta is calcified. Loop recorder is in place. Lungs are clear. No pleural fluid. IMPRESSION: No acute findings. Electronically Signed   By: Lorin Picket M.D.   On: 02/23/2020 09:24       Medical Problem List and Plan: 1.  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  Planning loop recorder.  -patient may  Shower after covered loop recorder- pt really wants hair washed  -ELOS/Goals: 16-18 days mod I 2.  Antithrombotics: -DVT/anticoagulation: SCDs  -antiplatelet therapy: Aspirin 81 mg daily and Plavix 75 mg day x3 weeks and then Plavix alone 3. Pain Management: Tylenol as needed 4. Mood: Trazodone 50 mg nightly, melatonin 3 mg nightly  -antipsychotic agents: N/A 5. Neuropsych: This patient is capable of making decisions on his own behalf. 6. Skin/Wound Care: Routine skin checks 7. Fluids/Electrolytes/Nutrition: Routine in and outs with follow-up chemistries 8.  Dysphagia.  Dysphagia #1 honey thick liquids.  Follow-up speech therapy. Will check Cr/BUN in Am- if significantly elevated, might need night IVFs for now Monitor hydration 9.  Hyperlipidemia.  Lipitor 10.  Hypothyroidism.  Continue Tapazole 11.  Tobacco abuse.  NicoDerm patch.  Provide counseling- smoked 2ppd- ok with Micoderm 12.  BPH.  Flomax 0.4 mg daily.  Check PVR 13.  GERD.  Protonix 14.  CAD with history of stenting.  Continue Plavix and aspirin 15. Thrush? Will try Diflucan 200 mg x 7 days. Might require nystatin swish and spit 16. Constipation- asked for Colace   Nelva Nay- PA-C 02/24/2020    I have personally performed a face to face diagnostic evaluation of this patient and formulated the key components of the plan.  Additionally, I have personally reviewed laboratory data, imaging studies, as well as relevant notes and concur with the physician assistant's documentation above.     Courtney Heys, MD 02/24/2020

## 2020-02-24 NOTE — Progress Notes (Signed)
Inpatient Rehab Admissions Coordinator:   I have a bed available for pt to admit to CIR today. Dr. Eliseo Squires in agreement.  Will let pt/family and TOC team know.   Shann Medal, PT, DPT Admissions Coordinator [phone] 02/24/20  9:49 AM

## 2020-02-24 NOTE — Progress Notes (Signed)
Physical Medicine and Rehabilitation Consult Reason for Consult: Left side weakness and slurred speech Referring Physician: Triad     HPI: David Hendricks is a 70 y.o. right-handed male with history of tobacco abuse, COPD, CAD with stenting maintained on aspirin, hyperlipidemia, hyperthyroidism, BPH, anxiety with depression.  Per chart review patient lives alone.  Reportedly independent prior to admission.  1 level apartment.  Presented to 06/28/2020 with left-sided weakness and slurred speech.  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  Small focus of susceptibility artifact in this region representing small thrombosed distal cortical MCA branch.  Possible additional punctate infarct in the right parietal cortex versus infarct.  No substantial edema or mass-effect.  CT angiogram of head and neck the nondominant right vertebral artery is diminutive throughout its course and likely occluded intradurally otherwise no large vessel occlusion or proximal hemodynamically significant stenosis.  Admission chemistries unremarkable except glucose 104, SARS coronavirus negative, hemoglobin 18.3, TSH 0.919, urine drug screen negative.  Echocardiogram ejection fraction of 60 to 65% no wall motion abnormalities grade 1 diastolic dysfunction.  Patient currently maintained on aspirin and Plavix for CVA prophylaxis x3 weeks then Plavix alone.  Dysphagia #1 honey thick liquid diet.  Therapy evaluations completed due to patient's left-sided weakness slurred speech recommendations for physical medicine rehab consult.     Pt swears there are friends that can help him when he goes home.  Pt reports LBM last night- voiding well with condom catheter.  Also, notes is so sleepy sometimes, falls asleep when talking.  Neck sensitive, chronic- not painful.  Can't tell if leaning per pt- per PT he's leaning dramatically to Left, but better than 2 days ago.        Review  of Systems  Constitutional: Negative for chills and fever.  HENT: Negative for hearing loss.   Eyes: Negative for blurred vision and double vision.  Respiratory: Negative for cough and shortness of breath.   Cardiovascular: Negative for chest pain, palpitations and leg swelling.  Gastrointestinal: Positive for constipation. Negative for heartburn, nausea and vomiting.       GERD  Genitourinary: Positive for urgency. Negative for dysuria and hematuria.  Musculoskeletal: Positive for myalgias.  Skin: Negative for rash.  Neurological: Positive for speech change and weakness.       Vertigo  Psychiatric/Behavioral: Positive for depression.       Anxiety  All other systems reviewed and are negative.       Past Medical History:  Diagnosis Date  . Allergy    . Anxiety    . Aortic atherosclerosis ([place])    . Arthritis    . Benign prostatic hyperplasia (BPH) with urinary urgency    . Chronic kidney disease      KIDNEY STONES  . COPD (chronic obstructive pulmonary disease) ([place])      possible   . Coronary artery disease      10/18 PCI/DES to pLAD, mild nonobstructive disease in the Lcx/RCA. Normal EF.   Marland Kitchen Depression    . Dyslipidemia    . Dysrhythmia 2016    irregular heartbeat  . Gallstones    . GERD (gastroesophageal reflux disease)    . History of kidney stones    . Hyperlipidemia    . Hyperthyroidism    . Leg cramps    . Multiple pulmonary nodules    . Myocardial infarction ([place])      2018  .  Pneumonia    . Seasonal allergies    . Thyroid nodule    . Vertigo    . Vitamin B 12 deficiency           Past Surgical History:  Procedure Laterality Date  . COLONOSCOPY      . CORONARY ANGIOPLASTY   10/09/2016  . CORONARY STENT INTERVENTION N/A 10/09/2016    Procedure: CORONARY STENT INTERVENTION;  Surgeon: Nelva Bush, MD;  Location: [facility];  Service: Cardiovascular;  Laterality: N/A;  . CYSTOSCOPY WITH RETROGRADE PYELOGRAM, URETEROSCOPY AND STENT PLACEMENT  Left 06/17/2019    Procedure: CYSTOSCOPY WITH RETROGRADE PYELOGRAM, URETEROSCOPY AND STENT PLACEMENT;  Surgeon: Robley Fries, MD;  Location: [facility];  Service: Urology;  Laterality: Left;  1 HR  . HOLMIUM LASER APPLICATION Left 03/11/2023    Procedure: HOLMIUM LASER APPLICATION;  Surgeon: Robley Fries, MD;  Location: [facility];  Service: Urology;  Laterality: Left;  . INTRAVASCULAR ULTRASOUND/IVUS N/A 10/09/2016    Procedure: Intravascular Ultrasound/IVUS;  Surgeon: Nelva Bush, MD;  Location: [facility];  Service: Cardiovascular;  Laterality: N/A;  . IR CHOLANGIOGRAM EXISTING TUBE   11/18/2018  . IR PERC CHOLECYSTOSTOMY   11/16/2018  . LEFT HEART CATH AND CORONARY ANGIOGRAPHY N/A 10/09/2016    Procedure: LEFT HEART CATH AND CORONARY ANGIOGRAPHY;  Surgeon: Nelva Bush, MD;  Location: [facility];  Service: Cardiovascular;  Laterality: N/A;         Family History  Problem Relation Age of Onset  . Alcohol abuse Father          called [place] per pt  . Cancer Father          OAK CELL CANCER PER PT NON SURGICAL   . Colon cancer Neg Hx    . Colon polyps Neg Hx    . Esophageal cancer Neg Hx    . Rectal cancer Neg Hx    . Stomach cancer Neg Hx      Social History:  reports that he has been smoking cigarettes. He started smoking about 59 years ago. He has a 90.00 pack-year smoking history. He has never used smokeless tobacco. He reports that he does not drink alcohol and does not use drugs. Allergies: No Known Allergies          Facility-Administered Medications Prior to Admission  Medication Dose Route Frequency Provider Last Rate Last Admin  . cyanocobalamin ((VITAMIN B-12)) injection 1,000 mcg  1,000 mcg Intramuscular Q30 days Horald Pollen, MD   1,000 mcg at 08/29/19 1525          Medications Prior to Admission  Medication Sig Dispense Refill  . albuterol (VENTOLIN HFA) 108 (90 Base) MCG/ACT inhaler INHALE 2 PUFFS INTO THE LUNGS EVERY 6 HOURS AS  NEEDED (Patient taking differently: Inhale 2 puffs into the lungs every 6 (six) hours as needed for wheezing or shortness of breath.) 18 g 5  . aspirin EC 81 MG tablet Take 81 mg by mouth daily.      Marland Kitchen atorvastatin (LIPITOR) 80 MG tablet Take 1 tablet (80 mg total) by mouth daily. 90 tablet 3  . cyclobenzaprine (FLEXERIL) 5 MG tablet Take 5 mg by mouth 3 (three) times daily as needed for muscle spasms.      Marland Kitchen gabapentin (NEURONTIN) 100 MG capsule Take 100 mg by mouth 3 (three) times daily as needed (pain).      . methimazole (TAPAZOLE) 5 MG tablet TAKE 1 TABLET(5 MG)  BY MOUTH DAILY (Patient taking differently: Take 5 mg by mouth daily.) 90 tablet 1  . naproxen sodium (ALEVE) 220 MG tablet Take 220 mg by mouth daily as needed (mild pain).      . ondansetron (ZOFRAN) 4 MG tablet Take 1 tablet (4 mg total) by mouth every 6 (six) hours as needed for nausea or vomiting. 20 tablet 0  . OVER THE COUNTER MEDICATION Take 1 tablet by mouth daily as needed (nasal congestion). [employer] brand      . oxyCODONE-acetaminophen (PERCOCET) 5-325 MG tablet Take 1 tablet by mouth every 4 (four) hours as needed. (Patient taking differently: Take 1-1.5 tablets by mouth every 4 (four) hours as needed for moderate pain.) 20 tablet 0  . pantoprazole (PROTONIX) 40 MG tablet TAKE 1 TABLET BY MOUTH EVERY DAY (Patient taking differently: Take 40 mg by mouth daily as needed (heartburn).) 30 tablet 0  . tamsulosin (FLOMAX) 0.4 MG CAPS capsule Take 0.4 mg by mouth daily.       . traMADol (ULTRAM) 50 MG tablet Take 50 mg by mouth every 6 (six) hours as needed for moderate pain.      . traZODone (DESYREL) 50 MG tablet Take 50 mg by mouth at bedtime.      . budesonide-formoterol (SYMBICORT) 80-4.5 MCG/ACT inhaler Inhale 2 puffs into the lungs 2 (two) times daily. (Patient not taking: Reported on 02/15/2020) 1 Inhaler 3      Home: Home Living Family/patient expects to be discharged to:: Private residence Living Arrangements:  Alone Available Help at Discharge: Other (Comment) Type of Home: Apartment Home Access: Level entry Home Layout: One level Bathroom Shower/Tub: Chiropodist: Standard Bathroom Accessibility: Yes Home Equipment: None Additional Comments: Info taken from PT eval  Lives With: Alone  Functional History: Prior Function Level of Independence: Independent Functional Status:  Mobility: Bed Mobility Overal bed mobility: Needs Assistance Bed Mobility: Sit to Supine Supine to sit: Min assist,+2 for physical assistance Sit to supine: Max assist,+2 for physical assistance General bed mobility comments: maxA+2 for trunk elevation and BLE movement Transfers Overall transfer level: Needs assistance Equipment used: Ambulation equipment used Transfer via Lift Equipment: Stedy Transfers: Sit to/from Stand Sit to Stand: +2 physical assistance,Max assist General transfer comment: MaxA+2 to power up to standing in the steady, heavy lean to with poor awareness of midline once up; recliner to bed with stedy +2 assist Ambulation/Gait General Gait Details: unable   ADL: ADL Overall ADL's : Needs assistance/impaired Eating/Feeding: NPO Grooming: Maximal assistance,Wash/dry face,Sitting Grooming Details (indicate cue type and reason): washing face Upper Body Bathing: Maximal assistance,Sitting Lower Body Bathing: Total assistance,Cueing for safety,Sitting/lateral leans,Sit to/from stand Upper Body Dressing : Moderate assistance,Sitting Upper Body Dressing Details (indicate cue type and reason): donning gown Lower Body Dressing: Total assistance,Sitting/lateral leans,Sit to/from stand,Cueing for safety Toilet Transfer: Maximal assistance,+2 for physical assistance,+2 for safety/equipment,Cueing for safety,Cueing for sequencing Toilet Transfer Details (indicate cue type and reason): with larger stedy Toileting- Clothing Manipulation and Hygiene: Total assistance,Sitting/lateral  lean,Sit to/from stand,Cueing for safety Toileting - Clothing Manipulation Details (indicate cue type and reason): condom catheter on Functional mobility during ADLs: Maximal assistance,+2 for physical assistance,+2 for safety/equipment,Cueing for safety,Cueing for sequencing General ADL Comments: Session focused on getting pt back to bed due to MBS this PM. Pt requiring cues to follow for sequencing through task. Pt very lethargic and once lying in bed, pt was fast asleep.   Cognition: Cognition Overall Cognitive Status: No family/caregiver present to determine baseline cognitive  functioning Arousal/Alertness: Awake/alert Orientation Level: Oriented to person,Oriented to place,Disoriented to time,Disoriented to situation Attention: Sustained Sustained Attention: Impaired Sustained Attention Impairment: Verbal basic Memory: Impaired Memory Impairment: Storage deficit,Retrieval deficit,Decreased recall of new information Awareness: Impaired Awareness Impairment: Intellectual impairment,Emergent impairment Safety/Judgment: Impaired Cognition Arousal/Alertness: Lethargic Behavior During Therapy: Flat affect Overall Cognitive Status: No family/caregiver present to determine baseline cognitive functioning General Comments: Pt following simple 1 step commands and opening eyes every so often, but not consistently, espeically on L side. Pt agitated with RN in room prior to arrival, but PT/OT introducing selves and pt wanting to get OOB to recliner and pt relaxed. Pt was not comabtive, but did appear to have L facial droop and slurred speech with cognitive deficits noted.   Blood pressure 120/66, pulse 87, temperature 98.4 F (36.9 C), temperature source Oral, resp. rate 20, height 6\' 1"  (1.854 m), weight 99.8 kg, SpO2 92 %. Physical Exam Vitals and nursing note reviewed. Exam conducted with a chaperone present.  Constitutional:      Comments: Sitting EOB- transferred with min A of 2 therapy  using sara steady to bedside chair, O2 was off, but O2 sats dropped to 86% shortly, so put O2 back on, drooling from L side of mouth- a lot, NAD Both therapists in room  HENT:     Head: Normocephalic and atraumatic.     Comments: L facial droop- cannot control secretions out of left side of mouth Tongue appears L sided    Right Ear: External ear normal.     Left Ear: External ear normal.     Nose:     Comments: Wearing O2 2L- sats got back up to 93-94%    Mouth/Throat:     Mouth: Mucous membranes are moist.     Pharynx: Oropharyngeal exudate present.  Eyes:     General:        Right eye: No discharge.        Left eye: No discharge.     Extraocular Movements: Extraocular movements intact.     Conjunctiva/sclera: Conjunctivae normal.  Neck:     Comments: Neck very sensitive to even whisper type touch Cardiovascular:     Rate and Rhythm: Normal rate and regular rhythm.     Heart sounds: Normal heart sounds. No murmur heard. No gallop.   Pulmonary:     Comments: CTA B/L except when swallowing drool- got a little coarse, but cleared immediately; - no W/R/R- good air movement Abdominal:     Comments: Soft, NT, ND, (+)BS hypoactive  Genitourinary:    Comments: Condom catheter in place- light to medium amber urine Musculoskeletal:     Comments: RUE- 5-/5 LUE- bicep/tricep 3/5, grip 3-/5, finger abd 2+/5 RLE- 5-/5 LLE- 4/5 in HF, KE, DF and PF Was leaning to left while sitting EOB with 1-2 person assist to maintain sitting- required min A of 2 to sit-stand to steady  Skin:    Comments: No skin breakdown seen- IV looks OK- looked at backside,   Neurological:     Comments: Patient is awake alert no acute distress.  Makes eye contact with examiner follows commands.  Oriented to person place and time.  Speech is dysarthric but intelligible.  Very hard to understand due to dysarthria- says intact to light touch in all 4 extremities, however couldn't tell was leaning to left- was not  pushing.   Psychiatric:     Comments: Joking occ        Lab Results Last 24 Hours  Results for orders placed or performed during the hospital encounter of 02/15/20 (from the past 24 hour(s))  Basic metabolic panel     Status: Abnormal    Collection Time: 02/19/20  2:18 AM  Result Value Ref Range    Sodium 137 135 - 145 mmol/L    Potassium 3.6 3.5 - 5.1 mmol/L    Chloride 102 98 - 111 mmol/L    CO2 23 22 - 32 mmol/L    Glucose, Bld 83 70 - 99 mg/dL    BUN 11 8 - 23 mg/dL    Creatinine, Ser 0.85 0.61 - 1.24 mg/dL    Calcium 8.4 (L) 8.9 - 10.3 mg/dL    GFR, Estimated >60 >60 mL/min    Anion gap 12 5 - 15  CBC     Status: None    Collection Time: 02/19/20  2:18 AM  Result Value Ref Range    WBC 9.8 4.0 - 10.5 K/uL    RBC 5.70 4.22 - 5.81 MIL/uL    Hemoglobin 16.4 13.0 - 17.0 g/dL    HCT 49.3 39.0 - 52.0 %    MCV 86.5 80.0 - 100.0 fL    MCH 28.8 26.0 - 34.0 pg    MCHC 33.3 30.0 - 36.0 g/dL    RDW 12.6 11.5 - 15.5 %    Platelets 160 150 - 400 K/uL    nRBC 0.0 0.0 - 0.2 %       Imaging Results (Last 48 hours)  DG Swallowing Func-Speech Pathology   Result Date: 02/18/2020 Objective Swallowing Evaluation: Type of Study: MBS-Modified Barium Swallow Study  Patient Details Name: David Hendricks MRN: [id number] Date of Birth: 1950/04/30 Today's Date: 02/18/2020 Time: SLP Start Time (ACUTE ONLY): 1409 -SLP Stop Time (ACUTE ONLY): 1427 SLP Time Calculation (min) (ACUTE ONLY): 18 min Past Medical History: Past Medical History: Diagnosis Date . Allergy  . Anxiety  . Aortic atherosclerosis ([place])  . Arthritis  . Benign prostatic hyperplasia (BPH) with urinary urgency  . Chronic kidney disease   KIDNEY STONES . COPD (chronic obstructive pulmonary disease) ([place])   possible  . Coronary artery disease   10/18 PCI/DES to pLAD, mild nonobstructive disease in the Lcx/RCA. Normal EF.  Marland Kitchen Depression  . Dyslipidemia  . Dysrhythmia 2016  irregular heartbeat . Gallstones  . GERD (gastroesophageal reflux disease)   . History of kidney stones  . Hyperlipidemia  . Hyperthyroidism  . Leg cramps  . Multiple pulmonary nodules  . Myocardial infarction ([place])   2018 . Pneumonia  . Seasonal allergies  . Thyroid nodule  . Vertigo  . Vitamin B 12 deficiency  Past Surgical History: Past Surgical History: Procedure Laterality Date . COLONOSCOPY   . CORONARY ANGIOPLASTY  10/09/2016 . CORONARY STENT INTERVENTION N/A 10/09/2016  Procedure: CORONARY STENT INTERVENTION;  Surgeon: Nelva Bush, MD;  Location: [facility];  Service: Cardiovascular;  Laterality: N/A; . CYSTOSCOPY WITH RETROGRADE PYELOGRAM, URETEROSCOPY AND STENT PLACEMENT Left 06/17/2019  Procedure: CYSTOSCOPY WITH RETROGRADE PYELOGRAM, URETEROSCOPY AND STENT PLACEMENT;  Surgeon: Robley Fries, MD;  [facility];  Service: Urology;  Laterality: Left;  1 HR . HOLMIUM LASER APPLICATION Left 01/10/4578  Procedure: HOLMIUM LASER APPLICATION;  Surgeon: Robley Fries, MD;  [facility];  Service: Urology;  Laterality: Left; . INTRAVASCULAR ULTRASOUND/IVUS N/A 10/09/2016  Procedure: Intravascular Ultrasound/IVUS;  Surgeon: Nelva Bush, MD;  Location: [facility];  Service: Cardiovascular;  Laterality: N/A; . IR CHOLANGIOGRAM EXISTING TUBE  11/18/2018 . IR PERC  CHOLECYSTOSTOMY  11/16/2018 . LEFT HEART CATH AND CORONARY ANGIOGRAPHY N/A 10/09/2016  Procedure: LEFT HEART CATH AND CORONARY ANGIOGRAPHY;  Surgeon: Nelva Bush, MD;  Location: [facility];  Service: Cardiovascular;  Laterality: N/A; HPI: Pt is a 69 y/o male admitted secondary to slurred speech and L sided weakness. 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 PMH includes tobacco use, CAD, COPD, anxiety, and depression.  Subjective: alert, cooperative, dysarthric Assessment / Plan / Recommendation CHL IP CLINICAL IMPRESSIONS 02/18/2020 Clinical Impression Although pt presents with similar areas of weakness compared to yesterday's MBS, his improved mentation has a positive impact on his  performance and ability to incorporate swallowing strategies. Head turns do not have a significant effect, but he has mildly improved vallecular clearance with use of a chin tuck and hard swallows. Residue is still significant, but he demonstrated some awareness when he spontaneously brought most of the residue back into his mouth to swallow it again, clearing more of it on the second try. On subsequent swallows, pt could be cued to do the same. Penetration and trace aspiration occurs with nectar thick liquids that still spill into the airway before he swallows, but no aspiration is observed with honey thick liquids or purees. There is trace amounts of penetration that is not always fully cleared, and when also considering the amount of vallecular residue he has, he would still remain at risk for some aspiration across the duration of a meal. Discussed with pt and MD, who is in agreement with starting modified diet with use of precautions to reduce but not eliminate aspiration risk while palliative care consult is pending. Therefore, will start Dys 1 diet and honey thick liquids with the following  precautions: chin tuck, alternate solids/liquids, hard swallows, cue to bring food back into his mouth and swallow again.  SLP Visit Diagnosis Dysphagia, oropharyngeal phase (R13.12) Attention and concentration deficit following -- Frontal lobe and executive function deficit following -- Impact on safety and function Moderate aspiration risk   CHL IP TREATMENT RECOMMENDATION 02/18/2020 Treatment Recommendations Therapy as outlined in treatment plan below   Prognosis 02/18/2020 Prognosis for Safe Diet Advancement Good Barriers to Reach Goals Cognitive deficits;Severity of deficits Barriers/Prognosis Comment -- CHL IP DIET RECOMMENDATION 02/18/2020 SLP Diet Recommendations Dysphagia 1 (Puree) solids;Honey thick liquids Liquid Administration via Cup Medication Administration Crushed with puree Compensations Minimize environmental  distractions;Slow rate;Follow solids with liquid;Chin tuck;Other (Comment) Postural Changes Seated upright at 90 degrees;Remain semi-upright after after feeds/meals (Comment)   CHL IP OTHER RECOMMENDATIONS 02/18/2020 Recommended Consults -- Oral Care Recommendations Oral care QID Other Recommendations Have oral suction available;Order thickener from pharmacy;Prohibited food (jello, ice cream, thin soups);Remove water pitcher   CHL IP FOLLOW UP RECOMMENDATIONS 02/18/2020 Follow up Recommendations Inpatient Rehab   CHL IP FREQUENCY AND DURATION 02/18/2020 Speech Therapy Frequency (ACUTE ONLY) min 2x/week Treatment Duration 2 weeks      CHL IP ORAL PHASE 02/18/2020 Oral Phase Impaired Oral - Pudding Teaspoon -- Oral - Pudding Cup -- Oral - Honey Teaspoon -- Oral - Honey Cup Reduced posterior propulsion;Decreased bolus cohesion;Lingual/palatal residue;Weak lingual manipulation Oral - Nectar Teaspoon Reduced posterior propulsion;Decreased bolus cohesion;Lingual/palatal residue;Weak lingual manipulation Oral - Nectar Cup -- Oral - Nectar Straw -- Oral - Thin Teaspoon -- Oral - Thin Cup -- Oral - Thin Straw -- Oral - Puree Reduced posterior propulsion;Decreased bolus cohesion;Lingual/palatal residue;Weak lingual manipulation Oral - Mech Soft -- Oral - Regular -- Oral - Multi-Consistency -- Oral - Pill -- Oral Phase - Comment --  CHL IP PHARYNGEAL PHASE 02/18/2020 Pharyngeal Phase Impaired Pharyngeal- Pudding Teaspoon -- Pharyngeal -- Pharyngeal- Pudding Cup -- Pharyngeal -- Pharyngeal- Honey Teaspoon -- Pharyngeal -- Pharyngeal- Honey Cup Reduced pharyngeal peristalsis;Reduced epiglottic inversion;Reduced anterior laryngeal mobility;Reduced laryngeal elevation;Reduced tongue base retraction;Pharyngeal residue - valleculae;Penetration/Aspiration before swallow Pharyngeal Material enters airway, remains ABOVE vocal cords and not ejected out Pharyngeal- Nectar Teaspoon Reduced pharyngeal peristalsis;Reduced epiglottic inversion;Reduced  anterior laryngeal mobility;Reduced laryngeal elevation;Reduced tongue base retraction;Pharyngeal residue - valleculae;Penetration/Aspiration before swallow Pharyngeal Material enters airway, passes BELOW cords without attempt by patient to eject out (silent aspiration) Pharyngeal- Nectar Cup -- Pharyngeal -- Pharyngeal- Nectar Straw -- Pharyngeal -- Pharyngeal- Thin Teaspoon -- Pharyngeal -- Pharyngeal- Thin Cup -- Pharyngeal -- Pharyngeal- Thin Straw -- Pharyngeal -- Pharyngeal- Puree Reduced pharyngeal peristalsis;Reduced epiglottic inversion;Reduced anterior laryngeal mobility;Reduced laryngeal elevation;Reduced tongue base retraction;Pharyngeal residue - valleculae;Penetration/Apiration after swallow Pharyngeal Material enters airway, remains ABOVE vocal cords then ejected out Pharyngeal- Mechanical Soft -- Pharyngeal -- Pharyngeal- Regular -- Pharyngeal -- Pharyngeal- Multi-consistency -- Pharyngeal -- Pharyngeal- Pill -- Pharyngeal -- Pharyngeal Comment --  CHL IP CERVICAL ESOPHAGEAL PHASE 02/18/2020 Cervical Esophageal Phase WFL Pudding Teaspoon -- Pudding Cup -- Honey Teaspoon -- Honey Cup -- Nectar Teaspoon -- Nectar Cup -- Nectar Straw -- Thin Teaspoon -- Thin Cup -- Thin Straw -- Puree -- Mechanical Soft -- Regular -- Multi-consistency -- Pill -- Cervical Esophageal Comment -- Osie Bond., M.A. CCC-SLP Acute Rehabilitation Services Pager [phone] Office [phone] 02/18/2020, 4:35 PM               DG Swallowing Func-Speech Pathology   Result Date: 02/17/2020 Objective Swallowing Evaluation: Type of Study: MBS-Modified Barium Swallow Study  Patient Details Name: David Hendricks MRN: [id number] Date of Birth: Nov 23, 1950 Today's Date: 02/17/2020 Time: SLP Start Time (ACUTE ONLY): 1335 -SLP Stop Time (ACUTE ONLY): 1353 SLP Time Calculation (min) (ACUTE ONLY): 18 min Past Medical History: Past Medical History: Diagnosis Date . Allergy  . Anxiety  . Aortic atherosclerosis ([place])  . Arthritis  . Benign prostatic  hyperplasia (BPH) with urinary urgency  . Chronic kidney disease   KIDNEY STONES . COPD (chronic obstructive pulmonary disease) ([place])   possible  . Coronary artery disease   10/18 PCI/DES to pLAD, mild nonobstructive disease in the Lcx/RCA. Normal EF.  Marland Kitchen Depression  . Dyslipidemia  . Dysrhythmia 2016  irregular heartbeat . Gallstones  . GERD (gastroesophageal reflux disease)  . History of kidney stones  . Hyperlipidemia  . Hyperthyroidism  . Leg cramps  . Multiple pulmonary nodules  . Myocardial infarction ([place])   2018 . Pneumonia  . Seasonal allergies  . Thyroid nodule  . Vertigo  . Vitamin B 12 deficiency  Past Surgical History: Past Surgical History: Procedure Laterality Date . COLONOSCOPY   . CORONARY ANGIOPLASTY  10/09/2016 . CORONARY STENT INTERVENTION N/A 10/09/2016  Procedure: CORONARY STENT INTERVENTION;  Surgeon: Nelva Bush, MD;  Location: [facility];  Service: Cardiovascular;  Laterality: N/A; . CYSTOSCOPY WITH RETROGRADE PYELOGRAM, URETEROSCOPY AND STENT PLACEMENT Left 06/17/2019  Procedure: CYSTOSCOPY WITH RETROGRADE PYELOGRAM, URETEROSCOPY AND STENT PLACEMENT;  Surgeon: Robley Fries, MD;  Location: [facility];  Service: Urology;  Laterality: Left;  1 HR . HOLMIUM LASER APPLICATION Left 08/16/5025  Procedure: HOLMIUM LASER APPLICATION;  Surgeon: Robley Fries, MD;  Location: [facility];  Service: Urology;  Laterality: Left; . INTRAVASCULAR ULTRASOUND/IVUS N/A 10/09/2016  Procedure: Intravascular Ultrasound/IVUS;  Surgeon: Nelva Bush, MD;  Location: [facility];  Service: Cardiovascular;  Laterality: N/A; .  IR CHOLANGIOGRAM EXISTING TUBE  11/18/2018 . IR PERC CHOLECYSTOSTOMY  11/16/2018 . LEFT HEART CATH AND CORONARY ANGIOGRAPHY N/A 10/09/2016  Procedure: LEFT HEART CATH AND CORONARY ANGIOGRAPHY;  Surgeon: Nelva Bush, MD;  Location: [facility];  Service: Cardiovascular;  Laterality: N/A; HPI: Pt is a 70 y/o male admitted secondary to slurred speech and L sided weakness.  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 PMH includes tobacco use, CAD, COPD, anxiety, and depression.  Subjective: pt lethargic but following commands Assessment / Plan / Recommendation [facility] IP CLINICAL IMPRESSIONS 02/17/2020 Clinical Impression Pt presents with a significant oropharyngeal dysphagia that is also exacerbated by current level of alertness. He is quite lethargic throughout testing, although alert enough to follow commands and accept boluses consistently. Limited trials were given in light of severity of dysphagia and mentation. He has limited lingual control of boluses, allowing a spoonful of nectar thick liquids to spill immediately back into his laryngeal vestibule. Once it is there, he tries to achieve some laryngeal closure, which is successful at expelling some of the bolus back out of his airway, but he does aspirate some of it. Pt also has very limited base of tongue retraction, hyolaryngeal movement, and pharyngeal squeeze, so that almost an entire bolus of puree remains in his valleculae post-swallow. He cannot clear this on his own despite cues, with SLP also providing suction via yankauer to clear as much as possible. As this pureed residue seems to thin out by mixing with secretions, he begins to aspirate this as well. Recommend that pt remain NPO for now with consideration of temporary, alternative means of nutrition with plans to retest swallowing function when more alert. MD in agreement with keeping pt NPO and also plans to involve palliative care.  SLP Visit Diagnosis Dysphagia, oropharyngeal phase (R13.12) Attention and concentration deficit following -- Frontal lobe and executive function deficit following -- Impact on safety and function Severe aspiration risk   [facility] IP TREATMENT RECOMMENDATION 02/17/2020 Treatment Recommendations Therapy as outlined in treatment plan below   Prognosis 02/17/2020 Prognosis for Safe Diet Advancement Fair Barriers to Reach Goals Cognitive  deficits;Severity of deficits Barriers/Prognosis Comment -- [facility] IP DIET RECOMMENDATION 02/17/2020 SLP Diet Recommendations NPO;Alternative means - temporary Liquid Administration via -- Medication Administration Via alternative means Compensations -- Postural Changes --   [facility] IP OTHER RECOMMENDATIONS 02/17/2020 Recommended Consults -- Oral Care Recommendations Oral care QID Other Recommendations Have oral suction available   [facility] IP FOLLOW UP RECOMMENDATIONS 02/17/2020 Follow up Recommendations Inpatient Rehab   [facility] IP FREQUENCY AND DURATION 02/17/2020 Speech Therapy Frequency (ACUTE ONLY) min 2x/week Treatment Duration 2 weeks      [facility] IP ORAL PHASE 02/17/2020 Oral Phase Impaired Oral - Pudding Teaspoon -- Oral - Pudding Cup -- Oral - Honey Teaspoon -- Oral - Honey Cup -- Oral - Nectar Teaspoon Decreased bolus cohesion;Weak lingual manipulation Oral - Nectar Cup -- Oral - Nectar Straw -- Oral - Thin Teaspoon -- Oral - Thin Cup -- Oral - Thin Straw -- Oral - Puree Lingual/palatal residue;Weak lingual manipulation Oral - Mech Soft -- Oral - Regular -- Oral - Multi-Consistency -- Oral - Pill -- Oral Phase - Comment --  [facility] IP PHARYNGEAL PHASE 02/17/2020 Pharyngeal Phase Impaired Pharyngeal- Pudding Teaspoon -- Pharyngeal -- Pharyngeal- Pudding Cup -- Pharyngeal -- Pharyngeal- Honey Teaspoon -- Pharyngeal -- Pharyngeal- Honey Cup -- Pharyngeal -- Pharyngeal- Nectar Teaspoon Reduced pharyngeal peristalsis;Reduced epiglottic inversion;Reduced anterior laryngeal mobility;Reduced laryngeal elevation;Reduced tongue base retraction;Pharyngeal residue - valleculae;Penetration/Aspiration before  swallow Pharyngeal Material enters airway, passes BELOW cords without attempt by patient to eject out (silent aspiration) Pharyngeal- Nectar Cup -- Pharyngeal -- Pharyngeal- Nectar Straw -- Pharyngeal -- Pharyngeal- Thin Teaspoon -- Pharyngeal -- Pharyngeal- Thin Cup -- Pharyngeal -- Pharyngeal- Thin Straw -- Pharyngeal -- Pharyngeal- Puree Reduced  pharyngeal peristalsis;Reduced epiglottic inversion;Reduced anterior laryngeal mobility;Reduced laryngeal elevation;Reduced tongue base retraction;Pharyngeal residue - valleculae;Penetration/Apiration after swallow Pharyngeal Material enters airway, passes BELOW cords without attempt by patient to eject out (silent aspiration) Pharyngeal- Mechanical Soft -- Pharyngeal -- Pharyngeal- Regular -- Pharyngeal -- Pharyngeal- Multi-consistency -- Pharyngeal -- Pharyngeal- Pill -- Pharyngeal -- Pharyngeal Comment --  [facility] IP CERVICAL ESOPHAGEAL PHASE 02/17/2020 Cervical Esophageal Phase (No Data) Pudding Teaspoon -- Pudding Cup -- Honey Teaspoon -- Honey Cup -- Nectar Teaspoon -- Nectar Cup -- Nectar Straw -- Thin Teaspoon -- Thin Cup -- Thin Straw -- Puree -- Mechanical Soft -- Regular -- Multi-consistency -- Pill -- Cervical Esophageal Comment -- Osie Bond., M.A. CCC-SLP Acute Rehabilitation Services Pager [phone] Office [phone] 02/17/2020, 3:25 PM                    Assessment/Plan: Diagnosis: R posterior frontal stroke with R parietal cortex also affect with L hemiparesis, dysarthria, and dysphagia as well as balance/splace issues 1. Does the need for close, 24 hr/day medical supervision in concert with the patient's rehab needs make it unreasonable for this patient to be served in a less intensive setting? Yes 2. Co-Morbidities requiring supervision/potential complications: smoker 2ppd, COPD_ needs O2 currently, Hyperthyroid, HTN, CAD s/p hx of MI, BPH, anxiety, dperession 3. Due to bladder management, bowel management, safety, skin/wound care, disease management, medication administration, pain management and patient education, does the patient require 24 hr/day rehab nursing? Yes 4. Does the patient require coordinated care of a physician, rehab nurse, therapy disciplines of PT, OT and SLP to address physical and functional deficits in the context of the above medical diagnosis(es)? Yes Addressing  deficits in the following areas: balance, endurance, locomotion, strength, transferring, bowel/bladder control, bathing, dressing, feeding, grooming, toileting, speech and swallowing 5. Can the patient actively participate in an intensive therapy program of at least 3 hrs of therapy per day at least 5 days per week? Yes 6. The potential for patient to make measurable gains while on inpatient rehab is good and fair 7. Anticipated functional outcomes upon discharge from inpatient rehab are supervision and min assist  with PT, supervision and min assist with OT, supervision with SLP. 8. Estimated rehab length of stay to reach the above functional goals is: ~2-3 weeks- but needs assistance at home- concerned about dispo 9. Anticipated discharge destination: Home 10. Overall Rehab/Functional Prognosis: good and fair   RECOMMENDATIONS: This patient's condition is appropriate for continued rehabilitative care in the following setting: CIR and SNF Patient has agreed to participate in recommended program. Potentially Note that insurance prior authorization may be required for reimbursement for recommended care.   Comment:  1. Pt needs CIR, and would participate well and progress, but I'm concerned that sister/niece only family- no one lives in the area-  2. Will d/w admissions coordinators and see what's possible 3. Thank you for this consult     Cathlyn Parsons, PA-C 02/19/2020      I have personally performed a face to face diagnostic evaluation of this patient and formulated the key components of the plan.  Additionally, I have personally reviewed laboratory data, imaging studies, as well as relevant notes and concur with  the physician assistant's documentation above.

## 2020-02-24 NOTE — Progress Notes (Signed)
PMR Admission Coordinator Pre-Admission Assessment   Patient: David Hendricks is an 70 y.o., male MRN: [id number] DOB: October 08, 1950 Height: 6\' 1"  (185.4 cm) Weight: 99.8 kg                                                                                                                                                  Insurance Information HMO:     PPO:      PCP:      IPA:      80/20:      OTHER:  PRIMARY: Medicare A/B      Policy#: [id number]      Subscriber:  CM Name:       Phone#:      Fax#:  Pre-Cert#: verified online      Employer:  Benefits:  Phone #:      Name:  Eff. Date: A and B 05/10/14     Deduct: $1556      Out of Pocket Max: n/a      Life Max: n/a  CIR: 100%      SNF: 20 full days Outpatient: 80%     Co Ins: 20% Home Health: 100%      Co-Pay:  DME: 80%     Co-Pay: 20% Providers: pt choice SECONDARY: Medicaid      Policy#: [id number] p      Phone#:    Financial Counselor:       Phone#:    The "Data Collection Information Summary" for patients in Inpatient Rehabilitation Facilities with attached "Privacy Act [place] Records" was provided and verbally reviewed with: Patient   Emergency Contact Information         Contact Information     Name Relation Home Work Mobile    [name] Sister     [phone]       Current Medical History  Patient Admitting Diagnosis: R posterior frontal lobe CVA   History of Present Illness: David Hendricks is a 70 year old right-handed male with history of tobacco abuse, COPD, CAD with stenting maintained on aspirin, hyperlipidemia, hyperthyroidism, BPH anxiety with depression.  Presented 02/15/2020 with left-sided weakness and slurred speech.  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  Small focus of susceptibility artifact in this region representing small thrombosed distal cortical MCA branch.  Possible additional punctate infarct in the right parietal cortex versus infarct.  No substantial  edema or mass-effect.  CT angiogram of the head and neck nondominant right vertebral artery is diminutive throughout its course and likely occluded intradurally otherwise no large vessel occlusion or proximal hemodynamically significant stenosis.  Admission chemistries unremarkable aside glucose 104 SARS current virus negative hemoglobin 18.3, TSH 0.919.  Urine drug screen negative.  Echocardiogram with ejection fraction of 60 to 65% no wall motion abnormalities grade 1 diastolic dysfunction.  Currently maintained on  aspirin and Plavix for CVA prophylaxis x3 weeks then Plavix alone.  Awaiting plan for loop recorder.  Dysphagia #1 honey thick liquid diet.  Therapy evaluations completed due to patient's left-sided weakness and slurred speech was recommended for a comprehensive rehab program.   Complete NIHSS TOTAL: 3 Glasgow Coma Scale Score: 14   Past Medical History      Past Medical History:  Diagnosis Date  . Allergy    . Anxiety    . Aortic atherosclerosis ([place])    . Arthritis    . Benign prostatic hyperplasia (BPH) with urinary urgency    . Chronic kidney disease      KIDNEY STONES  . COPD (chronic obstructive pulmonary disease) ([place])      possible   . Coronary artery disease      10/18 PCI/DES to pLAD, mild nonobstructive disease in the Lcx/RCA. Normal EF.   Marland Kitchen Depression    . Dyslipidemia    . Dysrhythmia 2016    irregular heartbeat  . Gallstones    . GERD (gastroesophageal reflux disease)    . History of kidney stones    . Hyperlipidemia    . Hyperthyroidism    . Leg cramps    . Multiple pulmonary nodules    . Myocardial infarction ([place])      2018  . Pneumonia    . Seasonal allergies    . Thyroid nodule    . Vertigo    . Vitamin B 12 deficiency        Family History  family history includes Alcohol abuse in his father; Cancer in his father.   Prior Rehab/Hospitalizations:  Has the patient had prior rehab or hospitalizations prior to admission? No   Has the patient  had major surgery during 100 days prior to admission? Yes   Current Medications    Current Facility-Administered Medications:  .  [MAR Hold]  stroke: mapping our early stages of recovery book, , Does not apply, Once, Nicoletta Dress, Na, MD .  [facility] Hold] acetaminophen (TYLENOL) tablet 650 mg, 650 mg, Oral, Q4H PRN **OR** [MAR Hold] acetaminophen (TYLENOL) 160 MG/5ML solution 650 mg, 650 mg, Per Tube, Q4H PRN **OR** [MAR Hold] acetaminophen (TYLENOL) suppository 650 mg, 650 mg, Rectal, Q4H PRN, Nicoletta Dress, Na, MD .  [facility] Hold] albuterol (VENTOLIN HFA) 108 (90 Base) MCG/ACT inhaler 1 puff, 1 puff, Inhalation, Q4H PRN, Nicoletta Dress, Na, MD .  [facility] Hold] aspirin EC tablet 81 mg, 81 mg, Oral, Daily, 81 mg at 02/20/20 0821 **OR** [DISCONTINUED] aspirin suppository 300 mg, 300 mg, Rectal, Daily, Rosalin Hawking, MD .  [facility] Hold] atorvastatin (LIPITOR) tablet 80 mg, 80 mg, Oral, Daily, Li, Na, MD, 80 mg at 02/20/20 0821 .  [MAR Hold] bisacodyl (DULCOLAX) suppository 10 mg, 10 mg, Rectal, Daily PRN, Nicoletta Dress, Na, MD .  [facility] Hold] chlorhexidine (PERIDEX) 0.12 % solution 15 mL, 15 mL, Mouth Rinse, BID, Rama, Venetia Maxon, MD, 15 mL at 02/20/20 0826 .  [MAR Hold] clopidogrel (PLAVIX) tablet 75 mg, 75 mg, Oral, Daily, Li, Na, MD, 75 mg at 02/20/20 3235 .  [facility] Hold] MEDLINE mouth rinse, 15 mL, Mouth Rinse, q12n4p, Rama, Venetia Maxon, MD, 15 mL at 02/20/20 1529 .  [MAR Hold] methimazole (TAPAZOLE) tablet 5 mg, 5 mg, Oral, Daily, Li, Na, MD, 5 mg at 02/20/20 5732 .  [MAR Hold] nicotine (NICODERM CQ - dosed in mg/24 hours) patch 21 mg, 21 mg, Transdermal, Daily, Rama, Venetia Maxon, MD, 21 mg at 02/20/20 0826 .  [MAR Hold] ondansetron (  ZOFRAN) injection 4 mg, 4 mg, Intravenous, Q6H PRN, Vann, Jessica U, DO, 4 mg at 02/18/20 1837 .  [MAR Hold] pantoprazole (PROTONIX) EC tablet 40 mg, 40 mg, Oral, BID, Vann, Jessica U, DO, 40 mg at 02/20/20 1027 .  [facility] Hold] Resource ThickenUp Clear, , Oral, PRN, Eliseo Squires, Jessica U, DO .  [MAR Hold] senna-docusate (Senokot-S)  tablet 1 tablet, 1 tablet, Oral, QHS PRN, Nicoletta Dress, Na, MD .  [facility] Hold] tamsulosin (FLOMAX) capsule 0.4 mg, 0.4 mg, Oral, Daily, Li, Na, MD, 0.4 mg at 02/20/20 2536 .  [MAR Hold] traZODone (DESYREL) tablet 50 mg, 50 mg, Oral, QHS, Rama, Venetia Maxon, MD, 50 mg at 02/19/20 2123   Patients Current Diet:     Diet Order                      DIET - DYS 1 Room service appropriate? No; Fluid consistency: Honey Thick  Diet effective now                      Precautions / Restrictions Precautions Precautions: Fall,Other (comment) Precaution Comments: L weakness and lean, impulsive Restrictions Weight Bearing Restrictions: No    Has the patient had 2 or more falls or a fall with injury in the past year?No   Prior Activity Level Limited Community (1-2x/wk): was driving PTA, getting groceries, caring for home, no DME used   Prior Functional Level Prior Function Level of Independence: Independent   Self Care: Did the patient need help bathing, dressing, using the toilet or eating?  Independent   Indoor Mobility: Did the patient need assistance with walking from room to room (with or without device)? Independent   Stairs: Did the patient need assistance with internal or external stairs (with or without device)? Independent   Functional Cognition: Did the patient need help planning regular tasks such as shopping or remembering to take medications? Independent   Home Assistive Devices / Equipment Home Assistive Devices/Equipment: None Home Equipment: None   Prior Device Use: Indicate devices/aids used by the patient prior to current illness, exacerbation or injury? None of the above   Current Functional Level Cognition   Arousal/Alertness: Awake/alert Overall Cognitive Status: Impaired/Different from baseline Current Attention Level: Sustained Orientation Level: Oriented to person,Oriented to place Following Commands: Follows one step commands with increased time,Follows one step  commands consistently Safety/Judgement: Decreased awareness of safety,Decreased awareness of deficits General Comments: calm and cooperative but still with deficits in awareness of safety and overall deficits- impulsive and requiring multiple multimodal cues to prevent him from standing and moving prematurely. Still with need for increased processing time/command following. Attention: Sustained Sustained Attention: Impaired Sustained Attention Impairment: Verbal basic Memory: Impaired Memory Impairment: Storage deficit,Retrieval deficit,Decreased recall of new information Awareness: Impaired Awareness Impairment: Intellectual impairment,Emergent impairment Safety/Judgment: Impaired    Extremity Assessment (includes Sensation/Coordination)   Upper Extremity Assessment: Generalized weakness RUE Deficits / Details: AROM, WFLs; MMT 4/5 MM grade, good grip RUE Sensation: WNL RUE Coordination: WNL LUE Deficits / Details: LUE AAROM intact; fair grip strength; pt requiring assist for arm movement to hold onto stedy, but able to grip to stand LUE Sensation: decreased light touch LUE Coordination: decreased fine motor,decreased gross motor  Lower Extremity Assessment: Generalized weakness LLE Deficits / Details: L sided weakness LLE Sensation: decreased light touch,WNL     ADLs   Overall ADL's : Needs assistance/impaired Eating/Feeding: NPO Grooming: Wash/dry face,Standing,Min guard,Brushing hair Grooming Details (indicate cue type and reason): pt able  to stand in stedy to complete face washing and brushing hair with up to min guard assist for balance Upper Body Bathing: Maximal assistance,Sitting Lower Body Bathing: Total assistance,Cueing for safety,Sitting/lateral leans,Sit to/from stand Upper Body Dressing : Moderate assistance,Sitting Upper Body Dressing Details (indicate cue type and reason): donning gown Lower Body Dressing: Total assistance,Bed level Lower Body Dressing Details  (indicate cue type and reason): to don socks Toilet Transfer: Min guard,Total assistance Toilet Transfer Details (indicate cue type and reason): minguard to sit<>stand to stedy, total A for the pivot in stedy Toileting- Clothing Manipulation and Hygiene: Total assistance,Sitting/lateral lean,Sit to/from stand,Cueing for safety Toileting - Clothing Manipulation Details (indicate cue type and reason): condom catheter on Functional mobility during ADLs: Minimal assistance,Min guard,+2 for safety/equipment General ADL Comments: pt able to complete multiple sit<>stands in stedy with minguard assist +2 for safety, pt able to stand at sink with minguard to complete grooming tasks. pt continues to present with impaired LUE [place] tasks, and visual deficits     Mobility   Overal bed mobility: Needs Assistance Bed Mobility: Supine to Sit,Sit to Supine Supine to sit: Min assist,HOB elevated Sit to supine: Min guard General bed mobility comments: MinA and increased time to get to EOB with HOB elevated, cues to scoot forward so B feet were on the floor. Lean much better today.     Transfers   Overall transfer level: Needs assistance Equipment used: Rolling walker (2 wheeled) Transfer via Lift Equipment: Stedy Transfers: Sit to/from [employer] to Stand: Hydrographic surveyor transfer comment: MInA to boost up to standing with RW, cues for hand placement and to get balance in standing before attempting gait     Ambulation / Gait / Stairs / Wheelchair Mobility   Ambulation/Gait Ambulation/Gait assistance: Herbalist (Feet): 70 Feet (89ft, 74ft) Assistive device: Rolling walker (2 wheeled) Gait Pattern/deviations: Step-through pattern,Trunk flexed,Trendelenburg General Gait Details: tends to push RW far ahead of him and required MinA for safety/control of device; moves very quickly and needed cues for slowing down to safe pace. SPO2 between 88-94% on room air with activity with one time drop to  86% but able to recover well with rest. Gait velocity: too fast to be considered safe given impairments     Posture / Balance Dynamic Sitting Balance Sitting balance - Comments: min guard for safety Balance Overall balance assessment: Needs assistance Sitting-balance support: Feet supported,No upper extremity supported Sitting balance-Leahy Scale: Fair Sitting balance - Comments: min guard for safety Postural control: Posterior lean,Left lateral lean Standing balance support: Bilateral upper extremity supported,During functional activity Standing balance-Leahy Scale: Fair Standing balance comment: benefits from BUE support     Special needs/care consideration Skin loop incision, Diabetic management yes and Designated visitor Dorris Vangorder (sister)        Previous Home Environment (from acute therapy documentation) Living Arrangements: Alone  Lives With: Alone Available Help at Discharge: Other (Comment) Type of Home: Apartment Home Layout: One level Home Access: Level entry Bathroom Shower/Tub: Chiropodist: Standard Bathroom Accessibility: Yes Home Care Services: No Additional Comments: Info taken from PT eval   Discharge Living Setting Plans for Discharge Living Setting: Patient's home Type of Home at Discharge: Apartment Discharge Home Layout: One level Discharge Home Access: Level entry Discharge Bathroom Shower/Tub: Tub/shower unit Discharge Bathroom Toilet: Standard Discharge Bathroom Accessibility: Yes How Accessible: Accessible via walker Does the patient have any problems obtaining your medications?: No (pt will be going to SNF)   Social/Family/Support Systems Anticipated  Caregiver: sister is primary contact for decisions/coordination of care Anticipated Caregiver's Contact Information: [name] [phone] Ability/Limitations of Caregiver: cannot provide assist as she [place] of [place] Discharge Plan Discussed with Primary Caregiver: Yes Is  Caregiver In Agreement with Plan?: Yes Does Caregiver/Family have Issues with Lodging/Transportation while Pt is in Rehab?: No     Goals Patient/Family Goal for Rehab: PT/OT/SLP intermittent mod I Expected length of stay: 16-18 days Additional Information: discussed goal updates with Dr. Naaman Plummer Pt/Family Agrees to Admission and willing to participate: Yes Program Orientation Provided & Reviewed with Pt/Caregiver Including Roles  & Responsibilities: Yes  Barriers to Discharge: Lack of/limited family support     Decrease burden of Care through IP rehab admission: n/a   Possible need for SNF placement upon discharge: Potentially.  If pt does not reach a level where he can safely discharge home with intermittent mod I will need to pursue SNF.    Patient Condition: This patient's medical and functional status has changed since the consult dated: 02/19/2020 in which the Rehabilitation Physician determined and documented that the patient's condition is appropriate for intensive rehabilitative care in an inpatient rehabilitation facility. See "History of Present Illness" (above) for medical update. Functional changes are: pt is min assist with mobility. Patient's medical and functional status update has been discussed with the Rehabilitation physician and patient remains appropriate for inpatient rehabilitation. Will admit to inpatient rehab today.   Preadmission Screen Completed By:  Michel Santee, PT, 02/20/2020 4:28 PM ______________________________________________________________________   Discussed status with Dr. Dagoberto Ligas on 02/24/20 at  9:50 AM  and received approval for admission today.   Admission Coordinator:  Michel Santee, PT, DPT time 9:50 AM Sudie Grumbling 02/24/20          Cosigned by: Courtney Heys, MD at 02/24/2020  9:59 AM

## 2020-02-24 NOTE — Progress Notes (Signed)
Report called to [facility] on 4W.

## 2020-02-24 NOTE — Care Management Important Message (Signed)
Important Message  Patient Details  Name: David Hendricks MRN: [id number] Date of Birth: 08-May-1950   Medicare Important Message Given:  Yes     Barb Merino Mozelle Remlinger 02/24/2020, 2:49 PM

## 2020-02-25 LAB — COMPREHENSIVE METABOLIC PANEL
ALT: 19 U/L (ref 0–44)
AST: 19 U/L (ref 15–41)
Albumin: 3.5 g/dL (ref 3.5–5.0)
Alkaline Phosphatase: 83 U/L (ref 38–126)
Anion gap: 10 (ref 5–15)
BUN: 14 mg/dL (ref 8–23)
CO2: 27 mmol/L (ref 22–32)
Calcium: 9.4 mg/dL (ref 8.9–10.3)
Chloride: 102 mmol/L (ref 98–111)
Creatinine, Ser: 0.96 mg/dL (ref 0.61–1.24)
GFR, Estimated: >60 mL/min (ref >60)
Glucose, Bld: 109 mg/dL — ABNORMAL HIGH (ref 70–99)
Potassium: 3.7 mmol/L (ref 3.5–5.1)
Sodium: 139 mmol/L (ref 135–145)
Total Bilirubin: 1.3 mg/dL — ABNORMAL HIGH (ref 0.3–1.2)
Total Protein: 6.1 g/dL — ABNORMAL LOW (ref 6.5–8.1)

## 2020-02-25 LAB — CBC WITH DIFFERENTIAL/PLATELET
Abs Immature Granulocytes: 0.03 10*3/uL (ref 0.00–0.07)
Basophils Absolute: 0.1 10*3/uL (ref 0.0–0.1)
Basophils Relative: 1 %
Eosinophils Absolute: 0.3 10*3/uL (ref 0.0–0.5)
Eosinophils Relative: 3 %
HCT: 50.8 % (ref 39.0–52.0)
Hemoglobin: 17.1 g/dL — ABNORMAL HIGH (ref 13.0–17.0)
Immature Granulocytes: 0 %
Lymphocytes Relative: 25 %
Lymphs Abs: 2.6 10*3/uL (ref 0.7–4.0)
MCH: 28.8 pg (ref 26.0–34.0)
MCHC: 33.7 g/dL (ref 30.0–36.0)
MCV: 85.5 fL (ref 80.0–100.0)
Monocytes Absolute: 0.6 10*3/uL (ref 0.1–1.0)
Monocytes Relative: 6 %
Neutro Abs: 6.5 10*3/uL (ref 1.7–7.7)
Neutrophils Relative %: 65 %
Platelets: 170 10*3/uL (ref 150–400)
RBC: 5.94 MIL/uL — ABNORMAL HIGH (ref 4.22–5.81)
RDW: 12.5 % (ref 11.5–15.5)
WBC: 10.1 10*3/uL (ref 4.0–10.5)
nRBC: 0 % (ref 0.0–0.2)

## 2020-02-25 MED ORDER — PAROXETINE HCL 10 MG PO TABS
10.0000 mg | ORAL_TABLET | Freq: Every day | ORAL | Status: DC
  Administered 2020-02-25 – 2020-02-26 (×2): 10 mg via ORAL
  Filled 2020-02-25 (×2): qty 1

## 2020-02-25 NOTE — Evaluation (Signed)
Speech Language Pathology Assessment and Plan  Patient Details  Name: David Hendricks MRN: [id number] Date of Birth: 12/17/1950  SLP Diagnosis: Cognitive Impairments;Dysphagia  Rehab Potential: Good ELOS: 10-14 days    Today's Date: 02/25/2020 SLP Individual Time: 0800-0900 SLP Individual Time Calculation (min): 60 min   Hospital Problem: Principal Problem:   Right middle cerebral artery stroke [facility])  Past Medical History:  Past Medical History:  Diagnosis Date  . Allergy   . Anxiety   . Aortic atherosclerosis ([place])   . Arthritis   . Benign prostatic hyperplasia (BPH) with urinary urgency   . Chronic kidney disease    KIDNEY STONES  . COPD (chronic obstructive pulmonary disease) ([place])    possible   . Coronary artery disease    10/18 PCI/DES to pLAD, mild nonobstructive disease in the Lcx/RCA. Normal EF.   [place] Kitchen Depression   . Dyslipidemia   . Dysrhythmia 2016   irregular heartbeat  . Gallstones   . GERD (gastroesophageal reflux disease)   . History of kidney stones   . Hyperlipidemia   . Hyperthyroidism   . Leg cramps   . Multiple pulmonary nodules   . Myocardial infarction ([place])    2018  . Pneumonia   . Seasonal allergies   . Thyroid nodule   . Vertigo   . Vitamin B 12 deficiency    Past Surgical History:  Past Surgical History:  Procedure Laterality Date  . COLONOSCOPY    . CORONARY ANGIOPLASTY  10/09/2016  . CORONARY STENT INTERVENTION N/A 10/09/2016   Procedure: CORONARY STENT INTERVENTION;  Surgeon: Nelva Bush, MD;  Location: [place] [facility];  Service: Cardiovascular;  Laterality: N/A;  . CYSTOSCOPY WITH RETROGRADE PYELOGRAM, URETEROSCOPY AND STENT PLACEMENT Left 06/17/2019   Procedure: CYSTOSCOPY WITH RETROGRADE PYELOGRAM, URETEROSCOPY AND STENT PLACEMENT;  Surgeon: Robley Fries, MD;  Location: [facility];  Service: Urology;  Laterality: Left;  1 HR  . HOLMIUM LASER APPLICATION Left 08/12/3823   Procedure: HOLMIUM LASER APPLICATION;  Surgeon: Robley Fries, MD;  Location: [facility];  Service: Urology;  Laterality: Left;  . INTRAVASCULAR ULTRASOUND/IVUS N/A 10/09/2016   Procedure: Intravascular Ultrasound/IVUS;  Surgeon: Nelva Bush, MD;  Location: [place] [facility];  Service: Cardiovascular;  Laterality: N/A;  . IR CHOLANGIOGRAM EXISTING TUBE  11/18/2018  . IR PERC CHOLECYSTOSTOMY  11/16/2018  . LEFT HEART CATH AND CORONARY ANGIOGRAPHY N/A 10/09/2016   Procedure: LEFT HEART CATH AND CORONARY ANGIOGRAPHY;  Surgeon: Nelva Bush, MD;  Location: [place] [facility];  Service: Cardiovascular;  Laterality: N/A;  . LOOP RECORDER INSERTION N/A 02/20/2020   Procedure: LOOP RECORDER INSERTION;  Surgeon: Vickie Epley, MD;  Location: [place] [facility];  Service: Cardiovascular;  Laterality: N/A;    Assessment / Plan / Recommendation  [name] is a 70 year old right-handed male with history of tobacco abuse, COPD, CAD with stenting maintained on aspirin, hyperlipidemia, hyperthyroidism, BPH anxiety with depression.  Per chart review patient lives alone.  Reportedly independent prior to admission.  1 level apartment.  Presented 02/15/2020 with left-sided weakness and slurred speech.  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  Small focus of susceptibility artifact in this region representing small thrombosed distal cortical MCA branch.  Possible additional punctate infarct in the right parietal cortex versus infarct.  No substantial edema or mass-effect.  CT angiogram of the head and neck nondominant right vertebral artery is diminutive throughout its course and likely occluded intradurally otherwise no  large vessel occlusion or proximal hemodynamically significant stenosis.  Admission chemistries unremarkable aside glucose 104 SARS current virus negative hemoglobin 18.3, TSH 0.919.  Urine drug screen negative.  Echocardiogram with ejection fraction of 60 to 65% no wall motion abnormalities grade  1 diastolic dysfunction.  Currently maintained on aspirin and Plavix for CVA prophylaxis x3 weeks then Plavix alone.  Awaiting plan for loop recorder.  Dysphagia #1 honey thick liquid diet.  Therapy evaluations completed due to patient's left-sided weakness and slurred speech was admitted for a comprehensive rehab program.  Clinical Impression Patient presents with a mild cognitive impairment, very mild flaccid dysarthria and a mod dysphagia. He demonstrated adequate attention, orientation to time/place/situation and demonstrated adequate basic level problem solving and reasoning. He was anxious and disctracted internally but was aware of this and asked SLP "Should I talk to someone?" (referring to a counselor, etc). Dysarthria was flaccid and caused by facial weakness on left side and although it impacted the quality of his speech some, his speech intelligibility at conversational level was close to 100%. He is on a modified texture diet of Dys 1 (puree) solids and honey thick liquids since most recent MBS on 2/6 secondary to aspiraiton of nectar and thin liquids. SLP plans repeat MBS with patient next date to determine readiness to upgrade. Paitent will be discharging home and does not have any family close by to offer assistance and so he will benefit from skilled SLP during CIR stay to work on higher level cognitive goals and swallow function goals.  Skilled Therapeutic Interventions          Bedside swallow evaluation, speech-language cognitive evaluation  SLP Assessment  Patient will need skilled Speech Lanaguage Pathology Services during CIR admission    Recommendations  SLP Diet Recommendations: Dysphagia 1 (Puree);Honey Liquid Administration via: Cup Medication Administration: Crushed with puree Supervision: Patient able to self feed;Intermittent supervision to cue for compensatory strategies Compensations: Minimize environmental distractions;Slow rate;Small sips/bites;Chin tuck Postural  Changes and/or Swallow Maneuvers: Seated upright 90 degrees;Upright 30-60 min after meal Oral Care Recommendations: Oral care BID Recommendations for Other Services: Neuropsych consult Patient destination: Home Follow up Recommendations: Other (comment) (TBD) Equipment Recommended: None recommended by SLP    SLP Frequency 3 to 5 out of 7 days   SLP Duration  SLP Intensity  SLP Treatment/Interventions 10-14 days  Minumum of 1-2 x/day, 30 to 90 minutes  Cognitive remediation/compensation;Dysphagia/aspiration precaution training;Medication managment;Functional tasks;Patient/family education    Pain Pain Assessment Pain Scale: 0-10 Pain Score: 0-No pain  Prior Functioning Cognitive/Linguistic Baseline: Within functional limits Type of Home: Apartment  Lives With: Alone Education: HS grad and [employer] 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, structural)  SLP Evaluation Cognition Overall Cognitive Status: Impaired/Different from baseline Arousal/Alertness: Awake/alert Orientation Level: Oriented X4 Attention: Sustained Sustained Attention: Impaired Sustained Attention Impairment: Verbal basic;Verbal complex Memory: Impaired Memory Impairment: Retrieval deficit;Storage deficit (recalled 2/4 words after 3 minutes delay without cues and 3/4 with choice cues) Awareness: Impaired Awareness Impairment: Anticipatory impairment Problem Solving: Impaired Problem Solving Impairment: Verbal complex Behaviors: Restless Safety/Judgment: Impaired  Comprehension Auditory Comprehension Overall Auditory Comprehension: Appears within functional limits for tasks assessed Expression Expression Primary Mode of Expression: Verbal Verbal Expression Overall Verbal Expression: Appears within functional limits for tasks assessed Oral Motor Oral Motor/Sensory Function Overall Oral Motor/Sensory Function: Mild impairment Facial ROM: Reduced left Facial Symmetry: Abnormal symmetry  left;Suspected CN VII (facial) dysfunction Facial Strength: Reduced left;Suspected CN VII (facial) dysfunction Lingual ROM: Within Functional Limits Lingual Symmetry:  Within Functional Limits Motor Speech Overall Motor Speech: Impaired Respiration: Within functional limits Phonation: Normal Resonance: Within functional limits Articulation: Impaired Level of Impairment: Conversation Intelligibility: Intelligible Word: 75-100% accurate Phrase: 75-100% accurate Conversation: 75-100% accurate Motor Planning: Witnin functional limits Motor Speech Errors: Not applicable  Care Tool Care Tool Cognition Expression of Ideas and Wants Expression of Ideas and Wants: Without difficulty (complex and basic) - expresses complex messages without difficulty and with speech that is clear and easy to understand   Understanding Verbal and Non-Verbal Content Understanding Verbal and Non-Verbal Content: Understands (complex and basic) - clear comprehension without cues or repetitions   Memory/Recall Ability *first 3 days only Memory/Recall Ability *first 3 days only: Current season;Location of own room;Staff names and faces;That he or she is in a hospital/hospital unit     PMSV Assessment  PMSV Trial Intelligibility: Intelligible Word: 75-100% accurate Phrase: 75-100% accurate Conversation: 75-100% accurate  Bedside Swallowing Assessment General Date of Onset: 02/15/20 Previous Swallow Assessment: MBS 2/6 Diet Prior to this Study: Dysphagia 1 (puree);Honey-thick liquids Temperature Spikes Noted: No Respiratory Status: Room air History of Recent Intubation: No Behavior/Cognition: Alert;Cooperative;Pleasant mood Oral Cavity - Dentition: Edentulous;Dentures, not available Self-Feeding Abilities: Able to feed self Vision: Functional for self-feeding Patient Positioning: Upright in bed Baseline Vocal Quality: Normal Volitional Cough: Strong Volitional Swallow: Able to elicit  Oral Care  Assessment   Ice Chips Ice chips: Not tested Thin Liquid Thin Liquid: Not tested Nectar Thick Nectar Thick Liquid: Not tested Honey Thick Honey Thick Liquid: Impaired Pharyngeal Phase Impairments: Other (comments) Other Comments: mild congested voice after sips of honey thick liquids Puree Puree: Within functional limits Solid Solid: Not tested BSE Assessment Risk for Aspiration Impact on safety and function: Moderate aspiration risk Other Related Risk Factors: History of GERD  Short Term Goals: Week 1: SLP Short Term Goal 1 (Week 1): Patient will perform swallow safety precautions with supervision A for accuracy and consistency. SLP Short Term Goal 2 (Week 1): Patient will tolerate Dys 2 solids, nectar thick liquids with minA. SLP Short Term Goal 3 (Week 1): Patient will particpiate in higher level problem solving tasks (medication management, financial management, etc) with minA. SLP Short Term Goal 4 (Week 1): Patient will demonstrate adequate anticipatory awareness during ADL"s and when discussing discharge planning with minA.  Refer to Care Plan for Long Term Goals  Recommendations for other services: Neuropsych  Discharge Criteria: Patient will be discharged from SLP if patient refuses treatment 3 consecutive times without medical reason, if treatment goals not met, if there is a change in medical status, if patient makes no progress towards goals or if patient is discharged from hospital.  The above assessment, treatment plan, treatment alternatives and goals were discussed and mutually agreed upon: by patient  Sonia Baller, MA, CCC-SLP Speech Therapy

## 2020-02-25 NOTE — Evaluation (Signed)
Physical Therapy Assessment and Plan  Patient Details  Name: David Hendricks MRN: [id number] Date of Birth: [birth year]  PT Diagnosis: Abnormality of gait, Difficulty walking, Hemiparesis non-dominant and Impaired cognition Rehab Potential: Good ELOS: 10-12 days  Today's Date: 02/25/2020 PT Individual Time: 1345-1450 PT Individual Time Calculation (min): 65 min    Hospital Problem: Principal Problem:   Right middle cerebral artery stroke [facility])   Past Medical History:  Past Medical History:  Diagnosis Date  . Allergy   . Anxiety   . Aortic atherosclerosis ([place])   . Arthritis   . Benign prostatic hyperplasia (BPH) with urinary urgency   . Chronic kidney disease    KIDNEY STONES  . COPD (chronic obstructive pulmonary disease) ([place])    possible   . Coronary artery disease    10/18 PCI/DES to pLAD, mild nonobstructive disease in the Lcx/RCA. Normal EF.   [place] Kitchen Depression   . Dyslipidemia   . Dysrhythmia 2016   irregular heartbeat  . Gallstones   . GERD (gastroesophageal reflux disease)   . History of kidney stones   . Hyperlipidemia   . Hyperthyroidism   . Leg cramps   . Multiple pulmonary nodules   . Myocardial infarction ([facility])    2018  . Pneumonia   . Seasonal allergies   . Thyroid nodule   . Vertigo   . Vitamin B 12 deficiency    Past Surgical History:  Past Surgical History:  Procedure Laterality Date  . COLONOSCOPY    . CORONARY ANGIOPLASTY  10/09/2016  . CORONARY STENT INTERVENTION N/A 10/09/2016   Procedure: CORONARY STENT INTERVENTION;  Surgeon: Nelva Bush, MD;  Location: [place] CV LAB;  Service: Cardiovascular;  Laterality: N/A;  . CYSTOSCOPY WITH RETROGRADE PYELOGRAM, URETEROSCOPY AND STENT PLACEMENT Left 06/17/2019   Procedure: CYSTOSCOPY WITH RETROGRADE PYELOGRAM, URETEROSCOPY AND STENT PLACEMENT;  Surgeon: Robley Fries, MD;  Location: [facility];  Service: Urology;  Laterality: Left;  1 HR  . HOLMIUM LASER APPLICATION Left 08/10/8001   Procedure:  HOLMIUM LASER APPLICATION;  Surgeon: Robley Fries, MD;  Location: [facility];  Service: Urology;  Laterality: Left;  . INTRAVASCULAR ULTRASOUND/IVUS N/A 10/09/2016   Procedure: Intravascular Ultrasound/IVUS;  Surgeon: Nelva Bush, MD;  Location: [place] CV LAB;  Service: Cardiovascular;  Laterality: N/A;  . IR CHOLANGIOGRAM EXISTING TUBE  11/18/2018  . IR PERC CHOLECYSTOSTOMY  11/16/2018  . LEFT HEART CATH AND CORONARY ANGIOGRAPHY N/A 10/09/2016   Procedure: LEFT HEART CATH AND CORONARY ANGIOGRAPHY;  Surgeon: Nelva Bush, MD;  Location: [place] [facility];  Service: Cardiovascular;  Laterality: N/A;  . LOOP RECORDER INSERTION N/A 02/20/2020   Procedure: LOOP RECORDER INSERTION;  Surgeon: Vickie Epley, MD;  Location: [place] [facility];  Service: Cardiovascular;  Laterality: N/A;    Assessment & Plan Clinical Impression: David Hendricks is a 70 year old right-handed male with history of tobacco abuse, COPD, CAD with stenting maintained on aspirin, hyperlipidemia, hyperthyroidism, BPH anxiety with depression.  Per chart review patient lives alone.  Reportedly independent prior to admission.  1 level apartment.  Presented 02/15/2020 with left-sided weakness and slurred speech.  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  Small focus of susceptibility artifact in this region representing small thrombosed distal cortical MCA branch.  Possible additional punctate infarct in the right parietal cortex versus infarct.  No substantial edema or mass-effect.  CT angiogram of the head and neck nondominant right vertebral artery is diminutive throughout its course and  likely occluded intradurally otherwise no large vessel occlusion or proximal hemodynamically significant stenosis.  Admission chemistries unremarkable aside glucose 104 SARS current virus negative hemoglobin 18.3, TSH 0.919.  Urine drug screen negative.  Echocardiogram with ejection fraction  of 60 to 65% no wall motion abnormalities grade 1 diastolic dysfunction.  Currently maintained on aspirin and Plavix for CVA prophylaxis x3 weeks then Plavix alone.  Awaiting plan for loop recorder.  Dysphagia #1 honey thick liquid diet.  Therapy evaluations completed due to patient's left-sided weakness and slurred speech was admitted for a comprehensive rehab program. Patient transferred to CIR on 02/24/2020 .   Patient currently requires min with mobility secondary to muscle joint tightness, decreased cardiorespiratoy endurance, decreased coordination, decreased awareness, decreased problem solving, decreased safety awareness and decreased memory and decreased standing balance, decreased postural control and decreased balance strategies.  Prior to hospitalization, patient was independent  with mobility and lived with Alone in a [place] home.  Home access is  Level entry.  Patient will benefit from skilled PT intervention to maximize safe functional mobility, minimize fall risk and decrease caregiver burden for planned discharge home alone.  Anticipate patient will benefit from follow up [place] at discharge.  PT - End of Session Activity Tolerance: Tolerates 30+ min activity with multiple rests Endurance Deficit: Yes Endurance Deficit Description: fatigues quickly during mobility PT Assessment Rehab Potential (ACUTE/IP ONLY): Good PT Barriers to Discharge: Decreased caregiver support;Lack of/limited family support PT Patient demonstrates impairments in the following area(s): Balance;Endurance;Safety PT Transfers Functional Problem(s): Bed Mobility;Bed to Chair;Car;Furniture;Floor PT Locomotion Functional Problem(s): Ambulation;Wheelchair Mobility;Stairs PT Plan PT Intensity: Minimum of 1-2 x/day ,45 to 90 minutes PT Frequency: 5 out of 7 days PT Duration Estimated Length of Stay: 10-12 PT Treatment/Interventions: Ambulation/gait training;Balance/vestibular training;Cognitive  remediation/compensation;Community reintegration;Discharge planning;DME/adaptive equipment instruction;Functional mobility training;Neuromuscular re-education;Patient/family education;Psychosocial support;Stair training;Therapeutic Activities;Therapeutic Exercise;UE/LE Strength taining/ROM;UE/LE Coordination activities PT Transfers Anticipated Outcome(s): mod I PT Locomotion Anticipated Outcome(s): mod I with LRAD PT Recommendation Recommendations for Other Services: Neuropsych consult Follow Up Recommendations: Home health PT Patient destination: Home Equipment Recommended: Rolling walker with 5" wheels Equipment Details: TBD pending progress   PT Evaluation Precautions/Restrictions Precautions Precautions: Fall Precaution Comments: Impulsive Restrictions Weight Bearing Restrictions: No Pain Pain Assessment Pain Scale: 0-10 Pain Score: 0-No pain Home Living/Prior Functioning Home Living Living Arrangements: Alone Type of Home: Apartment Home Access: Level entry Home Layout: One level  Lives With: Alone Prior Function Level of Independence: Independent with gait;Independent with transfers  Able to Take Stairs?: Yes Driving: Yes 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, structural) Vision/Perception  Vision - History Baseline Vision: Wears glasses all the time Patient Visual Report: Blurring of vision Vision - Assessment Ocular Range of Motion: Restricted on the left;Impaired-to be further tested in functional context Perception Perception: Within Functional Limits Praxis Praxis: Intact  Cognition Overall Cognitive Status: Impaired/Different from baseline Arousal/Alertness: Awake/alert Orientation Level: Oriented X4 Attention: Sustained Sustained Attention: Impaired Sustained Attention Impairment: Verbal basic;Verbal complex Memory: Impaired Memory Impairment: Retrieval deficit;Storage deficit (recalled 2/4 words after 3 minutes delay without cues and 3/4 with  choice cues) Awareness: Impaired Awareness Impairment: Anticipatory impairment Problem Solving: Impaired Problem Solving Impairment: Verbal complex Behaviors: Restless Safety/Judgment: Impaired Sensation Sensation Light Touch: Appears Intact (possible neuropathy at baseline?) Proprioception: Appears Intact Coordination Gross Motor Movements are Fluid and Coordinated: No Fine Motor Movements are Fluid and Coordinated: No Coordination and Movement Description: impaired 2/2 mild L hemi Motor  Motor Motor: Hemiplegia;Abnormal postural alignment and control Motor - Skilled Clinical Observations:  mild L hemi  Trunk/Postural Assessment  Cervical Assessment Cervical Assessment: Exceptions to [facility] (forward head) Thoracic Assessment Thoracic Assessment: Exceptions to [facility] (kyphotic) Lumbar Assessment Lumbar Assessment: Within Functional Limits Postural Control Postural Control: Deficits on evaluation Righting Reactions: delayed/impaired  Balance Balance Balance Assessed: Yes Static Sitting Balance Static Sitting - Balance Support: No upper extremity supported;Feet supported Static Sitting - Level of Assistance: 5: Stand by assistance Dynamic Sitting Balance Dynamic Sitting - Balance Support: No upper extremity supported;Feet supported;During functional activity Dynamic Sitting - Level of Assistance: 5: Stand by assistance Static Standing Balance Static Standing - Balance Support: Bilateral upper extremity supported;During functional activity Static Standing - Level of Assistance: 4: Min assist Dynamic Standing Balance Dynamic Standing - Balance Support: Bilateral upper extremity supported;During functional activity Dynamic Standing - Level of Assistance: 4: Min assist Extremity Assessment   RLE Assessment RLE Assessment: Within Functional Limits Passive Range of Motion (PROM) Comments: tight HS General Strength Comments: 5/5 grossly LLE Assessment LLE Assessment: Within  Functional Limits Passive Range of Motion (PROM) Comments: tight HS General Strength Comments: 5/5 grossly  Care Tool Care Tool Bed Mobility Roll left and right activity   Roll left and right assist level: Supervision/Verbal cueing    Sit to lying activity   Sit to lying assist level: Supervision/Verbal cueing    Lying to sitting edge of bed activity   Lying to sitting edge of bed assist level: Supervision/Verbal cueing     Care Tool Transfers Sit to stand transfer   Sit to stand assist level: Contact Guard/Touching assist    Chair/bed transfer   Chair/bed transfer assist level: Contact Guard/Touching assist     Toilet transfer        Car transfer   Car transfer assist level: Contact Guard/Touching assist      Care Tool Locomotion Ambulation   Assist level: Contact Guard/Touching assist Assistive device: Walker-rolling Max distance: 150'  Walk 10 feet activity   Assist level: Contact Guard/Touching assist Assistive device: Walker-rolling   Walk 50 feet with 2 turns activity   Assist level: Contact Guard/Touching assist Assistive device: Walker-rolling  Walk 150 feet activity   Assist level: Contact Guard/Touching assist Assistive device: Walker-rolling  Walk 10 feet on uneven surfaces activity   Assist level: Contact Guard/Touching assist Assistive device: Walker-rolling  Stairs   Assist level: Contact Guard/Touching assist Stairs assistive device: 2 hand rails Max number of stairs: 12 (6")  Walk up/down 1 step activity   Walk up/down 1 step (curb) assist level: Contact Guard/Touching assist Walk up/down 1 step or curb assistive device: 2 hand rails    Walk up/down 4 steps activity Walk up/down 4 steps assist level: Contact Guard/Touching assist Walk up/down 4 steps assistive device: 2 hand rails  Walk up/down 12 steps activity   Walk up/down 12 steps assist level: Contact Guard/Touching assist Walk up/down 12 steps assistive device: 2 hand rails  Pick up  small objects from floor Pick up small object from the floor (from standing position) activity did not occur: Safety/medical concerns      Wheelchair Will patient use wheelchair at discharge?: No          Wheel 50 feet with 2 turns activity      Wheel 150 feet activity        Refer to Care Plan for Long Term Goals  SHORT TERM GOAL WEEK 1 PT Short Term Goal 1 (Week 1): Pt will complete least restrictive transfer at Supervision level PT Short Term Goal 2 (  Week 1): Pt will ambulate x 150 ft with LRAD and Supervision PT Short Term Goal 3 (Week 1): Pt will complete objective balance measure to determine fall risk  Recommendations for other services: Neuropsych  Skilled Therapeutic Intervention Evaluation completed (see details above and below) with education on PT POC and goals and individual treatment initiated with focus on functional transfer assessment, orientation to rehab unit and schedule, and discussion with patient and family regarding ELOS and expected assist level upon d/c. Pt received seated in recliner in room, agreeable to PT session. No complaints of pain but reports ongoing cramping in BLE throughout session even at rest. Pt transfers with CGA and RW throughout session. Ambulation up to 150 ft with RW and CGA, cues to keep RW closer to body during gait. Ascend/descend 12 x 6" stairs with 2 handrails and CGA, step-through gait pattern. Car transfer with CGA with cues for safe transfer technique with use of RW. Pt reports onset of fatigue. Obtained w/c for dependent transport back to patient room due to fatigue. Pt requests to return to bed. Pt is Supervision for bed mobility. Pt left supine in bed with needs in reach, bed alarm in place. Pt is hyperverbal throughout session, requires cues to attend to therapy tasks. Pt also exhibits some impulsivity and needs cues for safety throughout session.  Mobility Bed Mobility Bed Mobility: Rolling Right;Rolling Left;Supine to Sit;Sit to  Supine Rolling Right: Supervision/verbal cueing Rolling Left: Supervision/Verbal cueing Supine to Sit: Supervision/Verbal cueing Sit to Supine: Supervision/Verbal cueing Transfers Transfers: Sit to Stand;Stand Pivot Transfers Sit to Stand: Contact Guard/Touching assist Stand Pivot Transfers: Contact Guard/Touching assist Transfer (Assistive device): Rolling walker Locomotion  Gait Gait Distance (Feet): 150 Feet Assistive device: Rolling walker Gait Gait Pattern: Impaired Gait velocity: decreased Stairs / Additional Locomotion Stairs: Yes Stairs Assistance: Contact Guard/Touching assist Stair Management Technique: Two rails;Alternating pattern Number of Stairs: 12 Height of Stairs: 6 Wheelchair Mobility Wheelchair Mobility: No   Discharge Criteria: Patient will be discharged from PT if patient refuses treatment 3 consecutive times without medical reason, if treatment goals not met, if there is a change in medical status, if patient makes no progress towards goals or if patient is discharged from hospital.  The above assessment, treatment plan, treatment alternatives and goals were discussed and mutually agreed upon: by patient and by family   Excell Seltzer, PT, DPT 02/25/2020, 4:16 PM

## 2020-02-25 NOTE — Plan of Care (Signed)
  Problem: RH Balance Goal: LTG Patient will maintain dynamic standing with ADLs (OT) Description: LTG:  Patient will maintain dynamic standing balance with assist during activities of daily living (OT)  Flowsheets (Taken 02/25/2020 1258) LTG: Pt will maintain dynamic standing balance during ADLs with: Independent with assistive device   Problem: Sit to Stand Goal: LTG:  Patient will perform sit to stand in prep for activites of daily living with assistance level (OT) Description: LTG:  Patient will perform sit to stand in prep for activites of daily living with assistance level (OT) Flowsheets (Taken 02/25/2020 1258) LTG: PT will perform sit to stand in prep for activites of daily living with assistance level: Independent with assistive device   Problem: RH Grooming Goal: LTG Patient will perform grooming w/assist,cues/equip (OT) Description: LTG: Patient will perform grooming with assist, with/without cues using equipment (OT) Flowsheets (Taken 02/25/2020 1258) LTG: Pt will perform grooming with assistance level of: Independent with assistive device    Problem: RH Bathing Goal: LTG Patient will bathe all body parts with assist levels (OT) Description: LTG: Patient will bathe all body parts with assist levels (OT) Flowsheets (Taken 02/25/2020 1258) LTG: Pt will perform bathing with assistance level/cueing: Independent with assistive device    Problem: RH Dressing Goal: LTG Patient will perform upper body dressing (OT) Description: LTG Patient will perform upper body dressing with assist, with/without cues (OT). Flowsheets (Taken 02/25/2020 1258) LTG: Pt will perform upper body dressing with assistance level of: Independent with assistive device Goal: LTG Patient will perform lower body dressing w/assist (OT) Description: LTG: Patient will perform lower body dressing with assist, with/without cues in positioning using equipment (OT) Flowsheets (Taken 02/25/2020 1258) LTG: Pt will perform  lower body dressing with assistance level of: Independent with assistive device   Problem: RH Toileting Goal: LTG Patient will perform toileting task (3/3 steps) with assistance level (OT) Description: LTG: Patient will perform toileting task (3/3 steps) with assistance level (OT)  Flowsheets (Taken 02/25/2020 1258) LTG: Pt will perform toileting task (3/3 steps) with assistance level: Independent with assistive device   Problem: RH Functional Use of Upper Extremity Goal: LTG Patient will use RT/LT upper extremity as a (OT) Description: LTG: Patient will use right/left upper extremity as a stabilizer/gross assist/diminished/nondominant/dominant level with assist, with/without cues during functional activity (OT) Flowsheets (Taken 02/25/2020 1258) LTG: Use of upper extremity in functional activities: LUE as dominant level LTG: Pt will use upper extremity in functional activity with assistance level of: Independent   Problem: RH Simple Meal Prep Goal: LTG Patient will perform simple meal prep w/assist (OT) Description: LTG: Patient will perform simple meal prep with assistance, with/without cues (OT). Flowsheets (Taken 02/25/2020 1258) LTG: Pt will perform simple meal prep with assistance level of: Independent with assistive device   Problem: RH Toilet Transfers Goal: LTG Patient will perform toilet transfers w/assist (OT) Description: LTG: Patient will perform toilet transfers with assist, with/without cues using equipment (OT) Flowsheets (Taken 02/25/2020 1258) LTG: Pt will perform toilet transfers with assistance level of: Independent with assistive device   Problem: RH Tub/Shower Transfers Goal: LTG Patient will perform tub/shower transfers w/assist (OT) Description: LTG: Patient will perform tub/shower transfers with assist, with/without cues using equipment (OT) Flowsheets (Taken 02/25/2020 1258) LTG: Pt will perform tub/shower stall transfers with assistance level of: Independent with  assistive device

## 2020-02-25 NOTE — Progress Notes (Signed)
PROGRESS NOTE   Subjective/Complaints:  Pt reports more anxiety since stroke and wondering how to handle this- will  Try to add an SSRI to help the depression/anxiety and also get pt to be seen by Neuropsych.   Has a spot on R forearm that is itching- worried about it.   Wants IV out.  Spoke with SLP about pt- they will try and get pt seen by Dr Jefm Miles  ROS:  Pt denies SOB, abd pain, CP, N/V/C/D, and vision changes   Objective:   No results found. Recent Labs    02/25/20 0512  WBC 10.1  HGB 17.1*  HCT 50.8  PLT 170   Recent Labs    02/25/20 0512  NA 139  K 3.7  CL 102  CO2 27  GLUCOSE 109*  BUN 14  CREATININE 0.96  CALCIUM 9.4    Intake/Output Summary (Last 24 hours) at 02/25/2020 1853 Last data filed at 02/25/2020 1826 Gross per 24 hour  Intake 480 ml  Output 600 ml  Net -120 ml        Physical Exam: Vital Signs Blood pressure 126/67, pulse 94, temperature 97.9 F (36.6 C), resp. rate 19, height 6\' 1"  (1.854 m), weight 92.9 kg, SpO2 91 %.  Constitutional:      Comments: Pt just woke up; laying in bed; dysarthric, but can understand him, drooling occ from L side of mouth, appropriate, NAD  HENT:     Head: Normocephalic and atraumatic.     Comments: L facial droop- tongue midline; tongue severely coated with thick white material and erythematous- c/w possible thrush- no specific plaques seen inside oropharynx other than tongue    Right Ear: External ear normal.     Left Ear: External ear normal.     Nose: Nose normal.     Mouth/Throat:     Mouth: Mucous membranes are dry.     Pharynx: Oropharyngeal exudate and posterior oropharyngeal erythema present.  Eyes:     Extraocular Movements: Extraocular movements intact.     Comments: Sclera B/L R>L erythematous and slightly muddy; no nystagmus seen- squinting some  Cardiovascular:     Rate and Rhythm: Normal rate and regular rhythm.     Heart  sounds: Normal heart sounds. No murmur heard. No gallop.   Pulmonary:     Comments: CTA B/L- no W/R/R- good air movement Abdominal:     Comments: Soft, NT, ND, (+)BS -normoactive  Musculoskeletal:     Cervical back: Normal range of motion and neck supple.     Comments: RUE 5/5 in biceps, triceps, WE, grip and finger abd LUE- 5-/5 except finger abd 4+/5 RLE- 5/5 in JF, KE, DF and PF LLE- 5-/5 in same muscles   Skin:    General: Skin is warm and dry.     Comments: Scattered ecchymoses on arms with a few skin tears and bruising esp on chest c/w loop recorder IV L forearm- looks OK Mild not raise, slightly erythematous- nonitchy rash on R buttock- heat rash?  Neurological:     Comments: Patient is alert in no acute distress.  Speech is mildly dysarthric but intelligible.  Left facial droop.  Oriented x3  and follows commands.  Fair insight and awareness Ox3- c/o blurry vision (doesn't have eyeglasses) Light touch intact x 4 extremities  Psychiatric:     Comments: Joking occ; appropriate     Assessment/Plan: 1. Functional deficits which require 3+ hours per day of interdisciplinary therapy in a comprehensive inpatient rehab setting.  Physiatrist is providing close team supervision and 24 hour management of active medical problems listed below.  Physiatrist and rehab team continue to assess barriers to discharge/monitor patient progress toward functional and medical goals  Care Tool:  Bathing    Body parts bathed by patient: Right arm,Left arm,Chest,Abdomen,Front perineal area,Buttocks,Right upper leg,Left upper leg,Right lower leg,Left lower leg,Face         Bathing assist Assist Level: Contact Guard/Touching assist     Upper Body Dressing/Undressing Upper body dressing   What is the patient wearing?: Pull over shirt    Upper body assist Assist Level: Supervision/Verbal cueing    Lower Body Dressing/Undressing Lower body dressing      What is the patient wearing?:  Pants     Lower body assist Assist for lower body dressing: Minimal Assistance - Patient > 75%     Toileting Toileting    Toileting assist Assist for toileting: Contact Guard/Touching assist     Transfers Chair/bed transfer  Transfers assist     Chair/bed transfer assist level: Contact Guard/Touching assist     Locomotion Ambulation   Ambulation assist      Assist level: Contact Guard/Touching assist Assistive device: Walker-rolling Max distance: 150'   Walk 10 feet activity   Assist     Assist level: Contact Guard/Touching assist Assistive device: Walker-rolling   Walk 50 feet activity   Assist    Assist level: Contact Guard/Touching assist Assistive device: Walker-rolling    Walk 150 feet activity   Assist    Assist level: Contact Guard/Touching assist Assistive device: Walker-rolling    Walk 10 feet on uneven surface  activity   Assist     Assist level: Contact Guard/Touching assist Assistive device: Walker-rolling   Wheelchair     Assist Will patient use wheelchair at discharge?: No             Wheelchair 50 feet with 2 turns activity    Assist            Wheelchair 150 feet activity     Assist          Blood pressure 126/67, pulse 94, temperature 97.9 F (36.6 C), resp. rate 19, height 6\' 1"  (1.854 m), weight 92.9 kg, SpO2 91 %.  Medical Problem List and Plan: 1.  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  Planning loop recorder.             -patient may  Shower after covered loop recorder- pt really wants hair washed  2/16- first day of therapies- pt ready.             -ELOS/Goals: 16-18 days mod I 2.  Antithrombotics: -DVT/anticoagulation: SCDs             -antiplatelet therapy: Aspirin 81 mg daily and Plavix 75 mg day x3 weeks and then Plavix alone 3. Pain Management: Tylenol as needed 4. Mood: Trazodone 50 mg nightly, melatonin 3 mg nightly              -antipsychotic agents: N/A 5. Neuropsych: This patient is capable of making decisions on his own behalf. 6. Skin/Wound Care: Routine skin  checks 7. Fluids/Electrolytes/Nutrition: Routine in and outs with follow-up chemistries 8.  Dysphagia.  Dysphagia #1 honey thick liquids.  Follow-up speech therapy. Will check Cr/BUN in Am- if significantly elevated, might need night IVFs for now Monitor hydration  2/16- BUN and Cr NOT elevated so will con't to monitor 2x/week and add IVFs if cannot keep up his hydration with honey thick liquids.  9.  Hyperlipidemia.  Lipitor 10.  Hypothyroidism.  Continue Tapazole 11.  Tobacco abuse.  NicoDerm patch.  Provide counseling- smoked 2ppd- ok with Micoderm 12.  BPH.  Flomax 0.4 mg daily.  Check PVR 13.  GERD.  Protonix 14.  CAD with history of stenting.  Continue Plavix and aspirin 15. Thrush? Will try Diflucan 200 mg x 7 days. Might require nystatin swish and spit  2/16- says mouth feels a little better- con't Diflucan 16. Constipation- asked for Colace 17. Anxiety/depression  2/16- common in new stroke patients- will have pt seen by Neuropsych and start Paxil 10 mg QHS for both Sx's.  18. Dispo  2/16- will d/c IV for now- bothering pt.     LOS: 1 days A FACE TO FACE EVALUATION WAS PERFORMED  Aalina Brege 02/25/2020, 6:53 PM

## 2020-02-25 NOTE — Evaluation (Signed)
Occupational Therapy Assessment and Plan  Patient Details  Name: David Hendricks MRN: [id number] Date of Birth: [birth year]  OT Diagnosis: abnormal posture, altered mental status, ataxia, cognitive deficits, disturbance of vision, hemiplegia affecting dominant side and muscle weakness (generalized) Rehab Potential:   ELOS: 10-14 days   Today's Date: 02/25/2020 OT Individual Time: 1749-4496 OT Individual Time Calculation (min): 74 min     Hospital Problem: Principal Problem:   Right middle cerebral artery stroke [facility])   Past Medical History:  Past Medical History:  Diagnosis Date  . Allergy   . Anxiety   . Aortic atherosclerosis ([place])   . Arthritis   . Benign prostatic hyperplasia (BPH) with urinary urgency   . Chronic kidney disease    KIDNEY STONES  . COPD (chronic obstructive pulmonary disease) ([place])    possible   . Coronary artery disease    10/18 PCI/DES to pLAD, mild nonobstructive disease in the Lcx/RCA. Normal EF.   Marland Kitchen Depression   . Dyslipidemia   . Dysrhythmia 2016   irregular heartbeat  . Gallstones   . GERD (gastroesophageal reflux disease)   . History of kidney stones   . Hyperlipidemia   . Hyperthyroidism   . Leg cramps   . Multiple pulmonary nodules   . Myocardial infarction ([place])    2018  . Pneumonia   . Seasonal allergies   . Thyroid nodule   . Vertigo   . Vitamin B 12 deficiency    Past Surgical History:  Past Surgical History:  Procedure Laterality Date  . COLONOSCOPY    . CORONARY ANGIOPLASTY  10/09/2016  . CORONARY STENT INTERVENTION N/A 10/09/2016   Procedure: CORONARY STENT INTERVENTION;  Surgeon: Nelva Bush, MD;  Location: [place] CV LAB;  Service: Cardiovascular;  Laterality: N/A;  . CYSTOSCOPY WITH RETROGRADE PYELOGRAM, URETEROSCOPY AND STENT PLACEMENT Left 06/17/2019   Procedure: CYSTOSCOPY WITH RETROGRADE PYELOGRAM, URETEROSCOPY AND STENT PLACEMENT;  Surgeon: Robley Fries, MD;  Location: [facility];  Service: Urology;  Laterality:  Left;  1 HR  . HOLMIUM LASER APPLICATION Left 07/13/9161   Procedure: HOLMIUM LASER APPLICATION;  Surgeon: Robley Fries, MD;  Location: [facility] OR[facility];  Service: Urology;  Laterality: Left;  . INTRAVASCULAR ULTRASOUND/IVUS N/A 10/09/2016   Procedure: Intravascular Ultrasound/IVUS;  Surgeon: Nelva Bush, MD;  Location: [place] CV LAB;  Service: Cardiovascular;  Laterality: N/A;  . IR CHOLANGIOGRAM EXISTING TUBE  11/18/2018  . IR PERC CHOLECYSTOSTOMY  11/16/2018  . LEFT HEART CATH AND CORONARY ANGIOGRAPHY N/A 10/09/2016   Procedure: LEFT HEART CATH AND CORONARY ANGIOGRAPHY;  Surgeon: Nelva Bush, MD;  Location: [place] CV LAB;  Service: Cardiovascular;  Laterality: N/A;  . LOOP RECORDER INSERTION N/A 02/20/2020   Procedure: LOOP RECORDER INSERTION;  Surgeon: Vickie Epley, MD;  Location: [place] CV LAB;  Service: Cardiovascular;  Laterality: N/A;    Assessment & Plan Clinical Impression: David Hendricks is a 70 year old right-handed male with history of tobacco abuse, COPD, CAD with stenting maintained on aspirin, hyperlipidemia, hyperthyroidism, BPH anxiety with depression.  Per chart review patient lives alone.  Reportedly independent prior to admission.  1 level apartment.  Presented 02/15/2020 with left-sided weakness and slurred speech.  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  Small focus of susceptibility artifact in this region representing small thrombosed distal cortical MCA branch.  Possible additional punctate infarct in the right parietal cortex versus infarct.  No substantial edema or mass-effect.  CT angiogram of the head  and neck nondominant right vertebral artery is diminutive throughout its course and likely occluded intradurally otherwise no large vessel occlusion or proximal hemodynamically significant stenosis.  Admission chemistries unremarkable aside glucose 104 SARS current virus negative hemoglobin 18.3, TSH 0.919.   Urine drug screen negative.  Echocardiogram with ejection fraction of 60 to 65% no wall motion abnormalities grade 1 diastolic dysfunction.  Currently maintained on aspirin and Plavix for CVA prophylaxis x3 weeks then Plavix alone.  Awaiting plan for loop recorder.  Dysphagia #1 honey thick liquid diet.  Therapy evaluations completed due to patient's left-sided weakness and slurred speech was admitted for a comprehensive rehab program.  Patient transferred to CIR on 02/24/2020 .    Patient currently requires min - CGA with basic self-care skills secondary to muscle weakness, decreased cardiorespiratoy endurance, impaired timing and sequencing, abnormal tone, unbalanced muscle activation, ataxia, decreased coordination and decreased motor planning, decreased visual acuity and decreased visual perceptual skills, decreased attention, decreased awareness, decreased problem solving, decreased safety awareness and decreased memory and decreased sitting balance, decreased standing balance, decreased postural control, hemiplegia and decreased balance strategies.  Prior to hospitalization, patient could complete BADL and IADL with independent .  Patient will benefit from skilled intervention to decrease level of assist with basic self-care skills, increase independence with basic self-care skills and increase level of independence with iADL prior to discharge home independently.  Anticipate patient will require intermittent supervision and follow up home health.  OT - End of Session Activity Tolerance: Tolerates 30+ min activity with multiple rests Endurance Deficit: Yes Endurance Deficit Description: fatigues quickly during mobility and full ADL routine OT Assessment OT Barriers to Discharge: Decreased caregiver support OT Patient demonstrates impairments in the following area(s): Balance;Cognition;Endurance;Motor;Safety;Sensory;Vision OT Basic ADL's Functional Problem(s):  Eating;Grooming;Bathing;Dressing;Toileting OT Advanced ADL's Functional Problem(s): Simple Meal Preparation;Light Housekeeping OT Transfers Functional Problem(s): Toilet;Tub/Shower OT Additional Impairment(s): Fuctional Use of Upper Extremity OT Plan OT Intensity: Minimum of 1-2 x/day, 45 to 90 minutes OT Frequency: 5 out of 7 days OT Duration/Estimated Length of Stay: 10-14 days OT Treatment/Interventions: Balance/vestibular training;Discharge planning;Pain management;Self Care/advanced ADL retraining;Therapeutic Activities;UE/LE Coordination activities;Visual/perceptual remediation/compensation;Therapeutic Exercise;Skin care/wound managment;Patient/family education;Functional mobility training;Disease mangement/prevention;Cognitive remediation/compensation;Community reintegration;DME/adaptive equipment instruction;Neuromuscular re-education;Psychosocial support;UE/LE Strength taining/ROM OT Self Feeding Anticipated Outcome(s): Indep OT Basic Self-Care Anticipated Outcome(s): Mod I OT Toileting Anticipated Outcome(s): Mod I OT Bathroom Transfers Anticipated Outcome(s): Mod I OT Recommendation Recommendations for Other Services: Neuropsych consult Patient destination: Home Follow Up Recommendations: Home health OT Equipment Recommended: To be determined;Tub/shower bench Equipment Details: TBD if needs TTB or shower seat   OT Evaluation Precautions/Restrictions  Precautions Precautions: Fall Precaution Comments: Impulsive Restrictions Weight Bearing Restrictions: No Vital Signs Therapy Vitals Temp: 97.9 F (36.6 C) Pulse Rate: 94 Resp: 19 BP: 126/67 Patient Position (if appropriate): Lying Oxygen Therapy SpO2: 91 % O2 Device: Room Air Pain Pain Assessment Pain Scale: 0-10 Pain Score: 0-No pain Home Living/Prior Functioning Home Living Family/patient expects to be discharged to:: Private residence Living Arrangements: Alone Type of Home: Apartment Home Access: Level  entry Home Layout: One level Bathroom Shower/Tub: Optometrist: Yes  Lives With: Alone IADL History Homemaking Responsibilities: Yes Meal Prep Responsibility: Primary Shopping Responsibility: Primary Education: HS grad and [employer] Occupation: Retired Prior Function Level of Independence: Independent with gait,Independent with transfers,Independent with basic ADLs,Independent with homemaking with ambulation  Able to Take Stairs?: Yes Driving: Yes Vocation: Retired Comments: walks dog - dog staying with family Vision Baseline Vision/History: Wears glasses Wears Glasses: Reading  only Patient Visual Report: Other (comment) (reports of blurring, especially in L peripheral vision, decreased tracking in all 4 quadrants) Vision Assessment?: Vision impaired- to be further tested in functional context;Yes Eye Alignment: Within Functional Limits Ocular Range of Motion: Restricted on the left;Impaired-to be further tested in functional context Tracking/Visual Pursuits: Decreased smoothness of eye movement to LEFT superior field;Decreased smoothness of eye movement to LEFT inferior field Perception  Perception: Within Functional Limits Praxis Praxis: Intact Cognition Overall Cognitive Status: Impaired/Different from baseline Arousal/Alertness: Awake/alert Orientation Level: Person;Place;Situation Person: Oriented Place: Oriented Situation: Oriented Year: 2022 Month: February Day of Week: Correct Memory: Impaired Memory Impairment: Retrieval deficit;Storage deficit Immediate Memory Recall: Sock;Blue;Bed Memory Recall Sock: Without Cue Memory Recall Blue: Without Cue Memory Recall Bed: Not able to recall Attention: Sustained Sustained Attention: Impaired Sustained Attention Impairment: Verbal basic;Verbal complex Awareness: Impaired Awareness Impairment: Anticipatory impairment Problem Solving: Impaired (mildly during  ADL) Problem Solving Impairment: Verbal complex Behaviors: Restless Safety/Judgment: Impaired Sensation Sensation Light Touch: Appears Intact Proprioception: Appears Intact Coordination Gross Motor Movements are Fluid and Coordinated: No Fine Motor Movements are Fluid and Coordinated: No Coordination and Movement Description: impaired 2/2 mild L hemi Motor  Motor Motor: Hemiplegia;Abnormal postural alignment and control Motor - Skilled Clinical Observations: mild L hemi  Trunk/Postural Assessment  Cervical Assessment Cervical Assessment: Exceptions to [facility] Thoracic Assessment Thoracic Assessment: Exceptions to [facility] Lumbar Assessment Lumbar Assessment: Within Functional Limits Postural Control Postural Control: Deficits on evaluation Righting Reactions: delayed/impaired  Balance Balance Balance Assessed: Yes Static Sitting Balance Static Sitting - Balance Support: No upper extremity supported;Feet supported Static Sitting - Level of Assistance: 5: Stand by assistance Dynamic Sitting Balance Dynamic Sitting - Balance Support: No upper extremity supported;Feet supported;During functional activity Dynamic Sitting - Level of Assistance: 5: Stand by assistance Dynamic Sitting - Balance Activities: Lateral lean/weight shifting;Forward lean/weight shifting;Reaching for Consulting civil engineer Standing - Balance Support: Bilateral upper extremity supported;During functional activity Static Standing - Level of Assistance: 4: Min assist;5: Stand by assistance Dynamic Standing Balance Dynamic Standing - Balance Support: Bilateral upper extremity supported;During functional activity Dynamic Standing - Level of Assistance: 4: Min assist Dynamic Standing - Balance Activities: Lateral lean/weight shifting;Forward lean/weight shifting;Reaching for objects Extremity/Trunk Assessment RUE Assessment RUE Assessment: Within Functional Limits LUE Assessment LUE Assessment:  Exceptions to [facility] Active Range of Motion (AROM) Comments: WFL but mild limitations at shoulder, mild discomfort with internal rotation General Strength Comments: roughly 4 to 4+/5 within his ROM  Care Tool Care Tool Self Care Eating    Set up    Oral Care    Oral Care Assist Level:  (not assessed - declined)    Bathing   Body parts bathed by patient: Right arm;Left arm;Chest;Abdomen;Front perineal area;Buttocks;Right upper leg;Left upper leg;Right lower leg;Left lower leg;Face     Assist Level: Contact Guard/Touching assist    Upper Body Dressing(including orthotics)   What is the patient wearing?: Pull over shirt   Assist Level: Supervision/Verbal cueing    Lower Body Dressing (excluding footwear)   What is the patient wearing?: Pants Assist for lower body dressing: Minimal Assistance - Patient > 75%    Putting on/Taking off footwear   What is the patient wearing?: Non-skid slipper socks Assist for footwear: Supervision/Verbal cueing       Care Tool Toileting Toileting activity   Assist for toileting: Contact Guard/Touching assist     Care Tool Bed Mobility Roll left and right activity   Roll left and right assist level: Supervision/Verbal cueing  Sit to lying activity   Sit to lying assist level: Supervision/Verbal cueing    Lying to sitting edge of bed activity   Lying to sitting edge of bed assist level: Supervision/Verbal cueing     Care Tool Transfers Sit to stand transfer   Sit to stand assist level: Contact Guard/Touching assist    Chair/bed transfer   Chair/bed transfer assist level: Contact Guard/Touching assist     Toilet transfer   Assist Level: Contact Guard/Touching assist     Care Tool Cognition Expression of Ideas and Wants Expression of Ideas and Wants: Without difficulty (complex and basic) - expresses complex messages without difficulty and with speech that is clear and easy to understand   Understanding Verbal and Non-Verbal Content  Understanding Verbal and Non-Verbal Content: Understands (complex and basic) - clear comprehension without cues or repetitions   Memory/Recall Ability *first 3 days only Memory/Recall Ability *first 3 days only: Current season;Location of own room;Staff names and faces;That he or she is in a hospital/hospital unit    Refer to Care Plan for [place] 1 OT Short Term Goal 1 (Week 1): Pt will complete LB bathe/dress close Supervision OT Short Term Goal 2 (Week 1): Pt will perform functional mobility in prep for ADL with LRAD w/ CS OT Short Term Goal 3 (Week 1): Pt will perform 3/3 toileting tasks with CS OT Short Term Goal 4 (Week 1): Pt will exhibit good safety awareness during ADL with no more than occasional cues  Recommendations for other services: Neuropsych   Skilled Therapeutic Intervention ADL ADL Eating: Not assessed Grooming: Setup Upper Body Bathing: Supervision/safety Where Assessed-Upper Body Bathing: Shower Lower Body Bathing: Contact guard Where Assessed-Lower Body Bathing: Shower Upper Body Dressing: Supervision/safety Lower Body Dressing: Minimal assistance Toileting: Contact guard Toilet Transfer: Curator: Contact guard Mobility  Bed Mobility Bed Mobility: Rolling Right;Rolling Left;Supine to Sit;Sit to Supine Rolling Right: Supervision/verbal cueing Rolling Left: Supervision/Verbal cueing Supine to Sit: Supervision/Verbal cueing Sit to Supine: Supervision/Verbal cueing Transfers Sit to Stand: Contact Guard/Touching assist    Pt greeted at time of session supine in bed resting, agreeable to OT session and eval. Discussed role and purpose of OT, pt verbalized understanding.   Supine > sit Supervision, walked from bed surface to shower with CGA with RW transferred to bench in same manner. Covered IV site and loop recorder already with waterproof covering. UB/LB bathing Supervision/CGA for LB, leaning L/R  for thoroughly cleaning buttocks and did not stand. Dried off same manner and walked from bathroom > sink CGA, UB dress Supervisoin for hemitechniqe and Min A for LB only to thread LLE but pt able to do all else. Performed grooming and shaving at sink level with assist for thoroughness for shaving but pt able to perform physically with LUE (dominant and affected UE). Note cues throughout for safety and sequencing at times as pt does have cognitive deficits from stroke. Set up in recliner with alarm on call bell in reach.    Discharge Criteria: Patient will be discharged from OT if patient refuses treatment 3 consecutive times without medical reason, if treatment goals not met, if there is a change in medical status, if patient makes no progress towards goals or if patient is discharged from hospital.  The above assessment, treatment plan, treatment alternatives and goals were discussed and mutually agreed upon: by patient and by family  Viona Gilmore 02/25/2020, 5:08 PM

## 2020-02-25 NOTE — Progress Notes (Signed)
Occupational Therapy Session Note  Patient Details  Name: Audry Pecina MRN: [id number] Date of Birth: 1950/04/09  Today's Date: 02/25/2020 OT Individual Time: 1532-1600 OT Individual Time Calculation (min): 28 min    Short Term Goals: Week 1:     Skilled Therapeutic Interventions/Progress Updates:    Pt greeted in room supine in bed resting with family members brother and sister in law present who had requested to speak with OT while here visiting prior to going home to [facility] tomorrow. This is in addition to eval this morning. With pt permission, discussed CLOF and level of assist required with ADLs and discussed POC. Family w/ questions regarding prognosis and expected level of independence at discharge. Communicated Mod I goals as pt lives alone but may need to recommend supervision for some tasks, verbalized understanding. Extensive discussion regarding goals, DC planning, DME needs, etc. Pt in bed alarm on call bell in reach. Focus of brief additional session on family ed/discussion with pt involvement for DC planning.   Therapy Documentation Precautions:  Precautions Precautions: Fall Precaution Comments: Impulsive Restrictions Weight Bearing Restrictions: No     Therapy/Group: Individual Therapy  Viona Gilmore 02/25/2020, 4:53 PM

## 2020-02-25 NOTE — Progress Notes (Addendum)
Inpatient Rehabilitation Care Coordinator Assessment and Plan Patient Details  Name: David Hendricks MRN: [id number] Date of Birth: July 11, 1950  Today's Date: 02/25/2020  Hospital Problems: Principal Problem:   Right middle cerebral artery stroke [facility])  Past Medical History:  Past Medical History:  Diagnosis Date  . Allergy   . Anxiety   . Aortic atherosclerosis ([place])   . Arthritis   . Benign prostatic hyperplasia (BPH) with urinary urgency   . Chronic kidney disease    KIDNEY STONES  . COPD (chronic obstructive pulmonary disease) ([place])    possible   . Coronary artery disease    10/18 PCI/DES to pLAD, mild nonobstructive disease in the Lcx/RCA. Normal EF.   Marland Kitchen Depression   . Dyslipidemia   . Dysrhythmia 2016   irregular heartbeat  . Gallstones   . GERD (gastroesophageal reflux disease)   . History of kidney stones   . Hyperlipidemia   . Hyperthyroidism   . Leg cramps   . Multiple pulmonary nodules   . Myocardial infarction ([place])    2018  . Pneumonia   . Seasonal allergies   . Thyroid nodule   . Vertigo   . Vitamin B 12 deficiency    Past Surgical History:  Past Surgical History:  Procedure Laterality Date  . COLONOSCOPY    . CORONARY ANGIOPLASTY  10/09/2016  . CORONARY STENT INTERVENTION N/A 10/09/2016   Procedure: CORONARY STENT INTERVENTION;  Surgeon: Nelva Bush, MD;  Location: [facility];  Service: Cardiovascular;  Laterality: N/A;  . CYSTOSCOPY WITH RETROGRADE PYELOGRAM, URETEROSCOPY AND STENT PLACEMENT Left 06/17/2019   Procedure: CYSTOSCOPY WITH RETROGRADE PYELOGRAM, URETEROSCOPY AND STENT PLACEMENT;  Surgeon: Robley Fries, MD;  Location: [facility];  Service: Urology;  Laterality: Left;  1 HR  . HOLMIUM LASER APPLICATION Left 07/12/5327   Procedure: HOLMIUM LASER APPLICATION;  Surgeon: Robley Fries, MD;  Location: [facility];  Service: Urology;  Laterality: Left;  . INTRAVASCULAR ULTRASOUND/IVUS N/A 10/09/2016   Procedure: Intravascular  Ultrasound/IVUS;  Surgeon: Nelva Bush, MD;  Location: [place] CV LAB;  Service: Cardiovascular;  Laterality: N/A;  . IR CHOLANGIOGRAM EXISTING TUBE  11/18/2018  . IR PERC CHOLECYSTOSTOMY  11/16/2018  . LEFT HEART CATH AND CORONARY ANGIOGRAPHY N/A 10/09/2016   Procedure: LEFT HEART CATH AND CORONARY ANGIOGRAPHY;  Surgeon: Nelva Bush, MD;  Location: [place] [facility];  Service: Cardiovascular;  Laterality: N/A;  . LOOP RECORDER INSERTION N/A 02/20/2020   Procedure: LOOP RECORDER INSERTION;  Surgeon: Vickie Epley, MD;  Location: [facility];  Service: Cardiovascular;  Laterality: N/A;   Social History:  reports that he has been smoking cigarettes. He started smoking about 59 years ago. He has a 90.00 pack-year smoking history. He has never used smokeless tobacco. He reports that he does not drink alcohol and does not use drugs.  Family / Support Systems Marital Status: Divorced How Long?: 22years Spouse/Significant Other: Divorced Children: No children Other Supports: some friends Anticipated Caregiver: N/A Ability/Limitations of Caregiver: No caregiver available Caregiver Availability: Intermittent Family Dynamics: Pt lives alone, and stays home majority of time aside from running general errands.  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 Education: some college Read: Yes Write: Yes Employment Status: Retired Date Retired/Disabled/Unemployed: 2016 Age Retired: 6 (years) Public relations account executive Issues: Denies Guardian/Conservator: N/A   Abuse/Neglect Abuse/Neglect Assessment Can Be  Completed: Yes Physical Abuse: Denies Verbal Abuse: Denies Sexual Abuse: Denies Exploitation of patient/patient's resources: Denies Self-Neglect: Denies  Emotional Status Pt's affect, behavior and adjustment status: Pt in  good spirits at time of visit Recent Psychosocial Issues: Pt admits to depression but does not take any medicaiton Psychiatric History: See above Substance Abuse History: Pt admits prior to admission he was smoking 2 packs of cigarettes per day. Denies eoth/rec drug use.  Patient / Family Perceptions, Expectations & Goals Pt/Family understanding of illness & functional limitations: Pt and family have a general understanding of his care needs, Premorbid pt/family roles/activities: Independent Anticipated changes in roles/activities/participation: Assistance with ADLs/IADLs  Community Resources Express Scripts: None Premorbid Home Care/DME Agencies: None Transportation available at discharge: TBD Resource referrals recommended: Neuropsychology  Discharge Planning Living Arrangements: Alone Support Systems: Other relatives,Friends/neighbors Type of Residence: Private residence Insurance Resources: [employer] (specify county) Pensions consultant: Radio broadcast assistant Screen Referred: No Living Expenses: Education officer, community Management: Patient Does the patient have any problems obtaining your medications?: No Home Management: Pt managed all of his care needs Patient/Family Preliminary Plans: N/A Care Coordinator Barriers to Discharge: Decreased caregiver support,Lack of/limited family support Care Coordinator Barriers to Discharge Comments: Pt will need to be Mod I at d/c as he has limited local supports. Pt can register for Medicaid transportation, and SW can explore other resources such as PCS if eligible at time of discharge. Care Coordinator Anticipated Follow Up Needs: HH/OP  Clinical Impression SW met with pt in room to introduce self, explain role, and discuss discharge process. Pt is not a English as a second language teacher. Pt states his HCPOA is sister in law David Hendricks [phone]) who lives in [place]. Reports she is currently visiting, but will be returning to [place] [employer] tomorrow. No DME. PCP- [facility]  per pt report.   12:39pm- SW spoke with his sister in law David Hendricks to introduce self, explain role, and discuss discharge process. SW answered questions with regard to other resources that would be available for pt. SW informed on PCS (if eligible), and pt can apply for Medicaid transportation to assist with medical appointments. SW to follow-up with updates after team conference.   Geoffrey Mankin A Phillipe Clemon 02/25/2020, 1:34 PM

## 2020-02-26 ENCOUNTER — Inpatient Hospital Stay (HOSPITAL_COMMUNITY): Payer: Medicare Other

## 2020-02-26 MED ORDER — TRAMADOL HCL 50 MG PO TABS
50.0000 mg | ORAL_TABLET | Freq: Four times a day (QID) | ORAL | Status: DC | PRN
  Administered 2020-02-27 – 2020-03-09 (×12): 50 mg via ORAL
  Filled 2020-02-26 (×15): qty 1

## 2020-02-26 MED ORDER — NYSTATIN 100000 UNIT/ML MT SUSP
5.0000 mL | Freq: Four times a day (QID) | OROMUCOSAL | Status: DC
  Administered 2020-02-26 – 2020-03-09 (×47): 500000 [IU] via OROMUCOSAL
  Filled 2020-02-26 (×48): qty 5

## 2020-02-26 MED ORDER — BACLOFEN 5 MG HALF TABLET
5.0000 mg | ORAL_TABLET | Freq: Four times a day (QID) | ORAL | Status: DC
  Administered 2020-02-26 – 2020-03-09 (×48): 5 mg via ORAL
  Filled 2020-02-26 (×49): qty 1

## 2020-02-26 NOTE — Care Management (Signed)
Inpatient [facility] Individual Statement of Services  Patient Name:  David Hendricks  Date:  02/26/2020  Welcome to the [facility].  Our goal is to provide you with an individualized program based on your diagnosis and situation, designed to meet your specific needs.  With this comprehensive rehabilitation program, you will be expected to participate in at least 3 hours of rehabilitation therapies Monday-Friday, with modified therapy programming on the weekends.  Your rehabilitation program will include the following services:  Physical Therapy (PT), Occupational Therapy (OT), Speech Therapy (ST), 24 hour per day rehabilitation nursing, Therapeutic Recreaction (TR), Psychology, Neuropsychology, Care Coordinator, Rehabilitation Medicine, Nutrition Services, Pharmacy Services and Other  Weekly team conferences will be held on Tuesdays to discuss your progress.  Your Inpatient Rehabilitation Care Coordinator will talk with you frequently to get your input and to update you on team discussions.  Team conferences with you and your family in attendance may also be held.  Expected length of stay: 10-14 days    Overall anticipated outcome: Independent with an Assistive Device  Depending on your progress and recovery, your program may change. Your Inpatient Rehabilitation Care Coordinator will coordinate services and will keep you informed of any changes. Your Inpatient Rehabilitation Care Coordinator's name and contact numbers are listed  below.  The following services may also be recommended but are not provided by the [facility] will be made to provide these services after discharge if needed.  Arrangements include referral to agencies that provide these services.  Your insurance has been verified to be:   Medicare A/B  Your primary doctor is:  [facility]   Pertinent information will be shared with your doctor and your insurance company.  Inpatient Rehabilitation Care Coordinator:  Cathleen Corti [phone] or ([phone]  Information discussed with and copy given to patient by: Rana Snare, 02/26/2020, 2:30 PM

## 2020-02-26 NOTE — Progress Notes (Signed)
Speech Language Pathology Daily Session Note  Patient Details  Name: David Hendricks MRN: [id number] Date of Birth: 12/14/1950  Today's Date: 02/26/2020 SLP Individual Time: 1445-1530 SLP Individual Time Calculation (min): 45 min  Short Term Goals: Week 1: SLP Short Term Goal 1 (Week 1): Patient will perform swallow safety precautions with supervision A for accuracy and consistency. SLP Short Term Goal 2 (Week 1): Patient will tolerate Dys 2 solids, nectar thick liquids with minA. SLP Short Term Goal 3 (Week 1): Patient will particpiate in higher level problem solving tasks (medication management, financial management, etc) with minA. SLP Short Term Goal 4 (Week 1): Patient will demonstrate adequate anticipatory awareness during ADL"s and when discussing discharge planning with minA.  Skilled Therapeutic Interventions:   Patient seen for skilled ST session focusing on dysphagia and cognitive goals. Patient consumed cup sips of plain water with only observed symptom being mildly congested voice (which has been present during all PO intake). SLP explained rationale of water protocol as well as in the advancement of his liquids. Patient also participated in completing subtest from [facility]FA and did demonstrate good attention and problem solving, however he had difficulty with organization, monitoring and self-correcting. He exhibited some frustration but also acknowledged that he was getting tired because he had been recently given muscle relaxer. Patient very receptive to continuing this sort of task next visit. Patient continues to benefit from skilled SLP intervention to maximize cognitive-linguistic and swallow function prior to discharge.  Pain Pain Assessment Pain Scale: 0-10 Pain Score: 0-No pain  Therapy/Group: Individual Therapy   Sonia Baller, MA, CCC-SLP Speech Therapy

## 2020-02-26 NOTE — Progress Notes (Signed)
Modified Barium Swallow Progress Note  Patient Details  Name: David Hendricks MRN: [id number] Date of Birth: 1950-11-05  Today's Date: 02/26/2020  Modified Barium Swallow completed.  Full report located under Chart Review in the Imaging Section.  Brief recommendations include the following:  Clinical Impression  Patient continues to present with a mild-mod oral and a mod-severe pharyngeal dysphagia but as compared to previous MBS on 2/9, has demonstrated some improvement in overall swallow function. He exhibited swallow initiation delays to pyriform sinus with thin liquids as well as silent penetration that did not fully clear. There was one instance of suspected trace aspiration after swallow from thin liquid residuals that had previously penetrated, however patient was moving too much for a clear image. He did not exhibit any aspiration or penetration with puree solids, regular solids, barium tablet or nectar thick liquids, but continues with significant pharyngeal residuals. Residuals are increased with heavier boluses but do start to clear with subsequent swallows. SLP did not observe significant benefit of chin tuck posture with preventing or minimizing pharyngeal residuals or aspiraiton, penetraiton.   Swallow Evaluation Recommendations       SLP Diet Recommendations: Dysphagia 1 (Puree) solids;Nectar thick liquid   Liquid Administration via: Cup;No straw   Medication Administration: Crushed with puree   Supervision: Patient able to self feed;Intermittent supervision to cue for compensatory strategies   Compensations: Minimize environmental distractions;Slow rate;Small sips/bites;Follow solids with liquid;Clear throat intermittently       Oral Care Recommendations: Oral care BID       Sonia Baller, MA, CCC-SLP Speech Therapy

## 2020-02-26 NOTE — Progress Notes (Signed)
Occupational Therapy Session Note  Patient Details  Name: David Hendricks MRN: [id number] Date of Birth: 1950-02-03  Today's Date: 02/26/2020 OT Individual Time: 0900-1000 OT Individual Time Calculation (min): 60 min    Short Term Goals: Week 1:  OT Short Term Goal 1 (Week 1): Pt will complete LB bathe/dress close Supervision OT Short Term Goal 2 (Week 1): Pt will perform functional mobility in prep for ADL with LRAD w/ CS OT Short Term Goal 3 (Week 1): Pt will perform 3/3 toileting tasks with CS OT Short Term Goal 4 (Week 1): Pt will exhibit good safety awareness during ADL with no more than occasional cues  Skilled Therapeutic Interventions/Progress Updates:    patient in bed, alert and ready for therapy.  He denies pain at this time.  Supine to sitting with CS.  He was able to doff pants seated edge of bed with min A and cues for safety, donns pants with CS, CGA for CM in stance.  OH shirt set up.  SPT with RW to/from bed, w/c, mat table with CGA and cues for safe technique.  Completed unsupported sitting balance, trunk control activities with CS.  Tolerated unsupported sitting for FMC/dexterity activities to include design copy with pegs and pipe tree - no difficulty with manipulation or ability to follow directions/copy.  Hand writing and visual scanning activities with mild difficulty using pen, visual scanning timely and did not miss items.  Walking on unit with RW CGA, cues for safety.  He returned to recliner at close of session, seat alarm set and call bell in hand.    Therapy Documentation Precautions:  Precautions Precautions: Fall Precaution Comments: Impulsive Restrictions Weight Bearing Restrictions: No   Therapy/Group: Individual Therapy  Carlos Levering 02/26/2020, 7:40 AM

## 2020-02-26 NOTE — Progress Notes (Signed)
PROGRESS NOTE   Subjective/Complaints:  Pt was worried anxiety might be "something else"- he feels it in epigastric area for some reason- a fluttering.  Throat really dry- and can't seem to make it moist with honey thick liquids- has MBS today to determine if cn improve to nectar thick/thin liquids.   Having severe cramps last night- is new- was 'Spasming" bad and cramped up so was in fetal position.   Trouble sleeping due to cramping.   ROS:  Pt denies SOB, abd pain, CP, N/V/C/D, and vision changes     Objective:   No results found. Recent Labs    02/25/20 0512  WBC 10.1  HGB 17.1*  HCT 50.8  PLT 170   Recent Labs    02/25/20 0512  NA 139  K 3.7  CL 102  CO2 27  GLUCOSE 109*  BUN 14  CREATININE 0.96  CALCIUM 9.4    Intake/Output Summary (Last 24 hours) at 02/26/2020 0908 Last data filed at 02/25/2020 1925 Gross per 24 hour  Intake 360 ml  Output 700 ml  Net -340 ml        Physical Exam: Vital Signs Blood pressure 117/68, pulse 76, temperature 97.8 F (36.6 C), resp. rate 18, height 6\' 1"  (1.854 m), weight 92.9 kg, SpO2 90 %.  Constitutional:   pt just woke up-drool on face; laying supine in bed; appropriate, NAD HENT: still squinting- L facial droop; tongue still thick plaque on tongue- dry oropharynx  Cardiovascular: RRR- no JVD No gallop.   Pulmonary: CTA B/L- no W/R/R- good air movement Abdominal: Soft, NT, ND, (+)BS   Musculoskeletal:     Cervical back: Normal range of motion and neck supple.     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 Functional deficits which require 3+ hours per day of interdisciplinary therapy in a comprehensive inpatient rehab setting.  Physiatrist is providing close team supervision and 24 hour management of active medical problems listed below.  Physiatrist and rehab team continue to assess barriers to discharge/monitor patient progress toward functional and medical goals  Care Tool:  Bathing    Body parts bathed by patient: Right arm,Left arm,Chest,Abdomen,Front perineal area,Buttocks,Right upper leg,Left upper leg,Right lower leg,Left lower leg,Face         Bathing assist Assist Level: Contact Guard/Touching assist     Upper Body Dressing/Undressing Upper body dressing   What is the patient wearing?: Pull over shirt    Upper body assist Assist Level: Supervision/Verbal cueing    Lower Body Dressing/Undressing Lower body dressing      What is the patient wearing?: Pants     Lower body assist Assist for lower body dressing: Minimal Assistance - Patient > 75%  Toileting Toileting    Toileting assist Assist for toileting: Contact Guard/Touching assist     Transfers Chair/bed transfer  Transfers assist     Chair/bed transfer assist level: Contact Guard/Touching assist     Locomotion Ambulation   Ambulation assist      Assist level: Contact Guard/Touching assist Assistive device: Walker-rolling Max distance: 150'   Walk 10 feet activity   Assist     Assist level: Contact Guard/Touching assist Assistive device: Walker-rolling   Walk 50 feet activity   Assist    Assist level: Contact Guard/Touching assist Assistive device: Walker-rolling    Walk 150 feet activity   Assist    Assist level: Contact Guard/Touching assist Assistive device: Walker-rolling    Walk 10 feet on uneven surface  activity   Assist      Assist level: Contact Guard/Touching assist Assistive device: Walker-rolling   Wheelchair     Assist Will patient use wheelchair at discharge?: No             Wheelchair 50 feet with 2 turns activity    Assist            Wheelchair 150 feet activity     Assist          Blood pressure 117/68, pulse 76, temperature 97.8 F (36.6 C), resp. rate 18, height 6\' 1"  (1.854 m), weight 92.9 kg, SpO2 90 %.  Medical Problem List and Plan: 1.  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  Planning loop recorder.             -patient may  Shower after covered loop recorder- pt really wants hair washed  2/16- first day of therapies- pt ready.             -ELOS/Goals: 16-18 days mod I 2.  Antithrombotics: -DVT/anticoagulation: SCDs             -antiplatelet therapy: Aspirin 81 mg daily and Plavix 75 mg day x3 weeks and then Plavix alone 3. Pain Management: Tylenol as needed  2/17- will add tramadol 50 mg q6 hours prn for pain 4. Mood: Trazodone 50 mg nightly, melatonin 3 mg nightly             -antipsychotic agents: N/A 5. Neuropsych: This patient is capable of making decisions on his own behalf. 6. Skin/Wound Care: Routine skin checks 7. Fluids/Electrolytes/Nutrition: Routine in and outs with follow-up chemistries 8.  Dysphagia.  Dysphagia #1 honey thick liquids.  Follow-up speech therapy. Will check Cr/BUN in Am- if significantly elevated, might need night IVFs for now Monitor hydration  2/16- BUN and Cr NOT elevated so will con't to monitor 2x/week and add IVFs if cannot keep up his hydration with honey thick liquids.   2/17 MBS today to see if can upgrade diet/liquids  9.  Hyperlipidemia.  Lipitor 10.  Hypothyroidism.  Continue Tapazole 11.  Tobacco abuse.  NicoDerm patch.  Provide counseling- smoked 2ppd- ok with Nicoderm  2/17- asked if could increase Patch- we cannot, but will add Gum if possible 12.  BPH.   Flomax 0.4 mg daily.  Check PVR 13.  GERD.  Protonix 14.  CAD with history of stenting.  Continue Plavix and aspirin 15. Thrush? Will try Diflucan 200 mg x 7 days. Might require nystatin swish and spit  2/16- says mouth feels a little better- con't Diflucan  2/17 - will add Nystatin swish and spit, since cannot swallow it.  16. Constipation- asked for  Colace 17. Anxiety/depression  2/16- common in new stroke patients- will have pt seen by Neuropsych and start Paxil 10 mg QHS for both Sx's.  2/17- says worried might be "something else"- feels it in epigastric area? Doesn't sounds like GERD 18. Muscle spasms  2/17- Will add Baclofen 5 mg QID for spasticity/muscle spasms  18. Dispo  2/16- will d/c IV for now- bothering pt.     LOS: 2 days A FACE TO FACE EVALUATION WAS PERFORMED  Canon Gola 02/26/2020, 9:08 AM

## 2020-02-26 NOTE — Progress Notes (Signed)
Patient ID: David Hendricks, male   DOB: [birth year], 70 y.o.   MRN: [id number]  SW informed pt sister [name] [phone]) on pt ELOS 10-14 days,and SW will follow-up with more updates after team conference on Tuesday.  Loralee Pacas, MSW, [place] Office: [phone] Cell: [phone] Fax: [phone]

## 2020-02-26 NOTE — Progress Notes (Signed)
Physical Therapy Session Note  Patient Details  Name: David Hendricks MRN: [id number] Date of Birth: 1950/09/05  Today's Date: 02/26/2020 PT Individual Time: 1350-1435 PT Individual Time Calculation (min): 45 min   Short Term Goals: Week 1:  PT Short Term Goal 1 (Week 1): Pt will complete least restrictive transfer at Supervision level PT Short Term Goal 2 (Week 1): Pt will ambulate x 150 ft with LRAD and Supervision PT Short Term Goal 3 (Week 1): Pt will complete objective balance measure to determine fall risk  Skilled Therapeutic Interventions/Progress Updates: Pt presented in bed agreeable to therapy. Pt denies pain during session. Performed supine to sit with supervision and use of bed feautres. Pt donned shirt with set up and increased time. Performed ambulatory transfer to w/c with RW and CGA. Pt transported to rehab gym for energy conservation. Performed ambulatory transfer to high/low mat with RW and CGA. Participated in [employer] with a score of 40/56 indicative of increased fall risk(<36= high risk for falls, close to 100%; 37-45 significant >80%; 46-51 moderate >50%; 52-55 lower >25%). Participated in STS x 5 without AD for static balance and BLE strengthening and toe taps to 6in step x 5 bilaterally and x10 alternating. Pt instructed in performing each activity slower to improve control requiring minA and mod verbal cues. After seated rest pt ambulated back to room with RW and CGA with PTA providing verbal cues for increasing BOS and improving erect posture. Upon return to room pt transferred to bed with supervision and left with bed alarm on, call bell within reach and needs met.        Therapy Documentation Precautions:  Precautions Precautions: Fall Precaution Comments: Impulsive Restrictions Weight Bearing Restrictions: No General:   Vital Signs: Therapy Vitals Temp: 98.2 F (36.8 C) Pulse Rate: 81 Resp: 18 BP: 127/70 Patient Position (if appropriate):  Lying Oxygen Therapy SpO2: 92 % O2 Device: Room Air Pain:   Mobility:   Locomotion :    Trunk/Postural Assessment :    Balance: Standardized Balance Assessment Standardized Balance Assessment: Oceanographer Test Berg Balance Test Sit to Stand: Able to stand  independently using hands Standing Unsupported: Able to stand safely 2 minutes Sitting with Back Unsupported but Feet Supported on Floor or Stool: Able to sit safely and securely 2 minutes Stand to Sit: Controls descent by using hands Transfers: Able to transfer safely, definite need of hands Standing Unsupported with Eyes Closed: Able to stand 10 seconds safely Standing Ubsupported with Feet Together: Able to place feet together independently and stand for 1 minute with supervision From Standing, Reach Forward with Outstretched Arm: Can reach forward >12 cm safely (5") From Standing Position, Pick up Object from Floor: Able to pick up shoe, needs supervision From Standing Position, Turn to Look Behind Over each Shoulder: Looks behind one side only/other side shows less weight shift Turn 360 Degrees: Able to turn 360 degrees safely one side only in 4 seconds or less Standing Unsupported, Alternately Place Feet on Step/Stool: Able to complete 4 steps without aid or supervision Standing Unsupported, One Foot in Front: Needs help to step but can hold 15 seconds Standing on One Leg: Tries to lift leg/unable to hold 3 seconds but remains standing independently Total Score: 40 Exercises:   Other Treatments:      Therapy/Group: Individual Therapy  Haik Mahoney 02/26/2020, 4:03 PM

## 2020-02-27 LAB — BASIC METABOLIC PANEL
Anion gap: 10 (ref 5–15)
BUN: 13 mg/dL (ref 8–23)
CO2: 29 mmol/L (ref 22–32)
Calcium: 9 mg/dL (ref 8.9–10.3)
Chloride: 99 mmol/L (ref 98–111)
Creatinine, Ser: 1.07 mg/dL (ref 0.61–1.24)
GFR, Estimated: >60 mL/min (ref >60)
Glucose, Bld: 101 mg/dL — ABNORMAL HIGH (ref 70–99)
Potassium: 3.6 mmol/L (ref 3.5–5.1)
Sodium: 138 mmol/L (ref 135–145)

## 2020-02-27 MED ORDER — CYANOCOBALAMIN 1000 MCG/ML IJ SOLN
1000.0000 ug | Freq: Once | INTRAMUSCULAR | Status: AC
  Administered 2020-02-27: 1000 ug via INTRAMUSCULAR
  Filled 2020-02-27: qty 1

## 2020-02-27 MED ORDER — CITALOPRAM HYDROBROMIDE 10 MG PO TABS
10.0000 mg | ORAL_TABLET | Freq: Every day | ORAL | Status: DC
  Administered 2020-02-27 – 2020-03-09 (×12): 10 mg via ORAL
  Filled 2020-02-27 (×12): qty 1

## 2020-02-27 NOTE — Progress Notes (Signed)
Physical Therapy Session Note  Patient Details  Name: David Hendricks MRN: [id number] Date of Birth: 1950-11-26  Today's Date: 02/27/2020 PT Individual Time: 1050-1129 PT Individual Time Calculation (min): 39 min   Short Term Goals: Week 1:  PT Short Term Goal 1 (Week 1): Pt will complete least restrictive transfer at Supervision level PT Short Term Goal 2 (Week 1): Pt will ambulate x 150 ft with LRAD and Supervision PT Short Term Goal 3 (Week 1): Pt will complete objective balance measure to determine fall risk  Skilled Therapeutic Interventions/Progress Updates:    Pt received sitting in recliner, agreeable to therapy. No reports of pain during session. Sit<>stand with CGA to RW and ambulated ~84ft to w/c within room. W/c transport to main rehab gym for time management and performed stand<>pivot with CGA and no AD from w/c to mat table. Pt self reporting L hand > R hand weakness. Assessed via handheld dynamometer:  RUE: -trial 1: 30kg -trial 2: 32kg -trial 3: 29kg  LUE: -trial 1: 22kg -trial 2: 22kg -trial 3: 20kg *pt is L hand dominant   Gait x144ft with CGA and RW and 2nd gait trail of 166ft with minA and no AD. Gait deficits worsen with no RW, including narrow BOS, forward flexed trunk, and reduced gait speed. Cues for corrections, safety awareness, and pacing. Performed stair training, up/down x12 steps with CGA and bilateral hand rails, self-selected reciprocal pattern and no knee buckling noted but mild unsteadiness present requiring CGA guard. Tandem gait 2x88ft with minA and no AD for steadying and lateral stepping 2x79ft with CGA and no AD. Patient easily distractible and verbose with story telling during our session and benefits from gentle redirection for task attenuation. Pt returned to his room with totalA in w/c. He requested to return to bed to rest. Stand<>pivot with CGA back to bed and sit>supine with supervision. He remained supine in bed with needs in reach and bed alarm  on.  Therapy Documentation Precautions:  Precautions Precautions: Fall Precaution Comments: Impulsive Restrictions Weight Bearing Restrictions: No  Therapy/Group: Individual Therapy  [name] P [name] PT 02/27/2020, 7:31 AM

## 2020-02-27 NOTE — Progress Notes (Signed)
PROGRESS NOTE   Subjective/Complaints:  Pt reports the Paxil has helped the anxiety, however is making his mouth really dry- wants to try and change the medicine.   Nystatin swish and spit is helping the plaques on tongue-  Spasms MUCH better- moderate improvement with Baclofen.   Asking for: to see Dr Sima Matas  - B12 shot  - R shoulder steroid injections- last received 3 months ago Happy about nectar thick liquids compared to honey thick liquids   ROS:  Pt denies SOB, abd pain, CP, N/V/C/D, and vision changes   Objective:   DG Swallowing Func-Speech Pathology  Result Date: 02/26/2020 Objective Swallowing Evaluation: Type of Study: MBS-Modified Barium Swallow Study  Patient Details Name: David Hendricks MRN: [id number] Date of Birth: September 05, 1950 Today's Date: 02/26/2020 Time: SLP Start Time (ACUTE ONLY): 1514 -SLP Stop Time (ACUTE ONLY): 1534 SLP Time Calculation (min) (ACUTE ONLY): 20 min Past Medical History: Past Medical History: Diagnosis Date . Allergy  . Anxiety  . Aortic atherosclerosis ([place])  . Arthritis  . Benign prostatic hyperplasia (BPH) with urinary urgency  . Chronic kidney disease   KIDNEY STONES . COPD (chronic obstructive pulmonary disease) ([place])   possible  . Coronary artery disease   10/18 PCI/DES to pLAD, mild nonobstructive disease in the Lcx/RCA. Normal EF.  Marland Kitchen Depression  . Dyslipidemia  . Dysrhythmia 2016  irregular heartbeat . Gallstones  . GERD (gastroesophageal reflux disease)  . History of kidney stones  . Hyperlipidemia  . Hyperthyroidism  . Leg cramps  . Multiple pulmonary nodules  . Myocardial infarction ([place])   2018 . Pneumonia  . Seasonal allergies  . Thyroid nodule  . Vertigo  . Vitamin B 12 deficiency  Past Surgical History: Past Surgical History: Procedure Laterality Date . COLONOSCOPY   . CORONARY ANGIOPLASTY  10/09/2016 . CORONARY STENT INTERVENTION N/A 10/09/2016  Procedure: CORONARY STENT INTERVENTION;   Surgeon: Nelva Bush, MD;  Location: [facility];  Service: Cardiovascular;  Laterality: N/A; . CYSTOSCOPY WITH RETROGRADE PYELOGRAM, URETEROSCOPY AND STENT PLACEMENT Left 06/17/2019  Procedure: CYSTOSCOPY WITH RETROGRADE PYELOGRAM, URETEROSCOPY AND STENT PLACEMENT;  Surgeon: Robley Fries, MD;  Location: [facility];  Service: Urology;  Laterality: Left;  1 HR . HOLMIUM LASER APPLICATION Left 09/12/5036  Procedure: HOLMIUM LASER APPLICATION;  Surgeon: Robley Fries, MD;  Location: [facility];  Service: Urology;  Laterality: Left; . INTRAVASCULAR ULTRASOUND/IVUS N/A 10/09/2016  Procedure: Intravascular Ultrasound/IVUS;  Surgeon: Nelva Bush, MD;  Location: [facility];  Service: Cardiovascular;  Laterality: N/A; . IR CHOLANGIOGRAM EXISTING TUBE  11/18/2018 . IR PERC CHOLECYSTOSTOMY  11/16/2018 . LEFT HEART CATH AND CORONARY ANGIOGRAPHY N/A 10/09/2016  Procedure: LEFT HEART CATH AND CORONARY ANGIOGRAPHY;  Surgeon: Nelva Bush, MD;  Location: [facility];  Service: Cardiovascular;  Laterality: N/A; . LOOP RECORDER INSERTION N/A 02/20/2020  Procedure: LOOP RECORDER INSERTION;  Surgeon: Vickie Epley, MD;  Location: [facility];  Service: Cardiovascular;  Laterality: N/A; HPI: Pt is a 71 y/o male admitted secondary to slurred speech and L sided weakness. 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 PMH includes tobacco use, CAD, COPD, anxiety, and depression.  Subjective: alert, cooperative,  dysarthric Assessment / Plan / Recommendation CHL IP CLINICAL IMPRESSIONS 02/26/2020 Clinical Impression Patient continues to present with a mild-mod oral and a mod-severe pharyngeal dysphagia but as compared to previous MBS on 2/9, has demonstrated some improvement in overall swallow function. He exhibited swallow initiation delays to pyriform sinus with thin liquids as well as silent penetration that did not fully clear. There was one instance of suspected trace aspiration after swallow from  thin liquid residuals that had previously penetrated, however patient was moving too much for a clear image. He did not exhibit any aspiration or penetration with puree solids, regular solids, barium tablet or nectar thick liquids, but continues with significant pharyngeal residuals. Residuals are increased with heavier boluses but do start to clear with subsequent swallows. SLP did not observe significant benefit of chin tuck posture with preventing or minimizing pharyngeal residuals or aspiraiton, penetraiton. SLP Visit Diagnosis Dysphagia, oropharyngeal phase (R13.12) Attention and concentration deficit following -- Frontal lobe and executive function deficit following -- Impact on safety and function Mild aspiration risk;Moderate aspiration risk   CHL IP TREATMENT RECOMMENDATION 02/26/2020 Treatment Recommendations Therapy as outlined in treatment plan below   Prognosis 02/18/2020 Prognosis for Safe Diet Advancement Good Barriers to Reach Goals Cognitive deficits;Severity of deficits Barriers/Prognosis Comment -- CHL IP DIET RECOMMENDATION 02/26/2020 SLP Diet Recommendations Dysphagia 1 (Puree) solids;Nectar thick liquid Liquid Administration via Cup;No straw Medication Administration Crushed with puree Compensations Minimize environmental distractions;Slow rate;Small sips/bites;Follow solids with liquid;Clear throat intermittently Postural Changes --   CHL IP OTHER RECOMMENDATIONS 02/26/2020 Recommended Consults -- Oral Care Recommendations Oral care BID Other Recommendations --   CHL IP FOLLOW UP RECOMMENDATIONS 02/22/2020 Follow up Recommendations Inpatient Rehab   CHL IP FREQUENCY AND DURATION 02/18/2020 Speech Therapy Frequency (ACUTE ONLY) min 2x/week Treatment Duration 2 weeks      CHL IP ORAL PHASE 02/26/2020 Oral Phase Impaired Oral - Pudding Teaspoon -- Oral - Pudding Cup -- Oral - Honey Teaspoon -- Oral - Honey Cup Weak lingual manipulation;Reduced posterior propulsion Oral - Nectar Teaspoon NT Oral - Nectar  Cup Weak lingual manipulation;Reduced posterior propulsion Oral - Nectar Straw -- Oral - Thin Teaspoon -- Oral - Thin Cup Weak lingual manipulation;Reduced posterior propulsion Oral - Thin Straw -- Oral - Puree Reduced posterior propulsion;Weak lingual manipulation Oral - Mech Soft -- Oral - Regular Reduced posterior propulsion;Weak lingual manipulation;Impaired mastication Oral - Multi-Consistency -- Oral - Pill Reduced posterior propulsion Oral Phase - Comment --  CHL IP PHARYNGEAL PHASE 02/26/2020 Pharyngeal Phase Impaired Pharyngeal- Pudding Teaspoon -- Pharyngeal -- Pharyngeal- Pudding Cup -- Pharyngeal -- Pharyngeal- Honey Teaspoon -- Pharyngeal -- Pharyngeal- Honey Cup -- Pharyngeal -- Pharyngeal- Nectar Teaspoon NT Pharyngeal -- Pharyngeal- Nectar Cup Delayed swallow initiation-vallecula;Reduced pharyngeal peristalsis;Reduced tongue base retraction;Pharyngeal residue - valleculae;Pharyngeal residue - pyriform;Pharyngeal residue - posterior pharnyx Pharyngeal Material does not enter airway Pharyngeal- Nectar Straw -- Pharyngeal -- Pharyngeal- Thin Teaspoon -- Pharyngeal -- Pharyngeal- Thin Cup Delayed swallow initiation-pyriform sinuses;Delayed swallow initiation-vallecula;Reduced airway/laryngeal closure;Reduced tongue base retraction;Reduced pharyngeal peristalsis;Penetration/Aspiration during swallow;Penetration/Apiration after swallow;Pharyngeal residue - valleculae;Pharyngeal residue - pyriform;Pharyngeal residue - posterior pharnyx Pharyngeal Material enters airway, remains ABOVE vocal cords and not ejected out;Material enters airway, passes BELOW cords without attempt by patient to eject out (silent aspiration) Pharyngeal- Thin Straw -- Pharyngeal -- Pharyngeal- Puree Reduced tongue base retraction;Reduced pharyngeal peristalsis;Pharyngeal residue - valleculae;Pharyngeal residue - posterior pharnyx Pharyngeal Material does not enter airway Pharyngeal- Mechanical Soft -- Pharyngeal -- Pharyngeal- Regular  Pharyngeal residue - valleculae;Pharyngeal residue - posterior pharnyx;Reduced tongue base  retraction;Reduced pharyngeal peristalsis Pharyngeal Material does not enter airway Pharyngeal- Multi-consistency -- Pharyngeal -- Pharyngeal- Pill WFL Pharyngeal Material does not enter airway Pharyngeal Comment --  CHL IP CERVICAL ESOPHAGEAL PHASE 02/26/2020 Cervical Esophageal Phase WFL Pudding Teaspoon -- Pudding Cup -- Honey Teaspoon -- Honey Cup -- Nectar Teaspoon -- Nectar Cup -- Nectar Straw -- Thin Teaspoon -- Thin Cup -- Thin Straw -- Puree -- Mechanical Soft -- Regular -- Multi-consistency -- Pill -- Cervical Esophageal Comment -- Sonia Baller, MA, CCC-SLP Speech Therapy             Recent Labs    02/25/20 0512  WBC 10.1  HGB 17.1*  HCT 50.8  PLT 170   Recent Labs    02/25/20 0512 02/27/20 0520  NA 139 138  K 3.7 3.6  CL 102 99  CO2 27 29  GLUCOSE 109* 101*  BUN 14 13  CREATININE 0.96 1.07  CALCIUM 9.4 9.0    Intake/Output Summary (Last 24 hours) at 02/27/2020 0843 Last data filed at 02/27/2020 0500 Gross per 24 hour  Intake 960 ml  Output 1300 ml  Net -340 ml        Physical Exam: Vital Signs Blood pressure 110/68, pulse 71, temperature 98.7 F (37.1 C), resp. rate 14, height 6\' 1"  (1.854 m), weight 92.9 kg, SpO2 93 %.  Constitutional:  pt walking with OT, appropriate, had some questions, in bedside chair now, NAD HENT: still squinting- L facial droop; tongue 1/2 of plaque off tongue Cardiovascular: RRR- no JVD   Pulmonary: CTA B/L- no W/R/R- good air movement Abdominal: Soft, NT, ND, (+)BS    Musculoskeletal:     Cervical back: Normal range of motion and neck supple.     Comments: RUE 5/5 in biceps, triceps, WE, grip and finger abd LUE- 5-/5 except finger abd 4+/5 RLE- 5/5 in JF, KE, DF and PF LLE- 5-/5 in same muscles Skin:loop recorder area really bruised/purple- R forearm less erythema in 1 spot- but c/o itching  Neurological: Ox3- Hoffman's on LUE   Speech is  mildly dysarthric but intelligible- mildly better today- can understand better Ox3- c/o blurry vision (doesn't have eyeglasses) Light touch intact x 4 extremities  Psychiatric: appropriate, good memory    Assessment/Plan: 1. Functional deficits which require 3+ hours per day of interdisciplinary therapy in a comprehensive inpatient rehab setting.  Physiatrist is providing close team supervision and 24 hour management of active medical problems listed below.  Physiatrist and rehab team continue to assess barriers to discharge/monitor patient progress toward functional and medical goals  Care Tool:  Bathing    Body parts bathed by patient: Right arm,Left arm,Chest,Abdomen,Front perineal area,Buttocks,Right upper leg,Left upper leg,Right lower leg,Left lower leg,Face         Bathing assist Assist Level: Contact Guard/Touching assist     Upper Body Dressing/Undressing Upper body dressing   What is the patient wearing?: Pull over shirt    Upper body assist Assist Level: Set up assist    Lower Body Dressing/Undressing Lower body dressing      What is the patient wearing?: Pants     Lower body assist Assist for lower body dressing: Contact Guard/Touching assist     Toileting Toileting    Toileting assist Assist for toileting: Contact Guard/Touching assist     Transfers Chair/bed transfer  Transfers assist     Chair/bed transfer assist level: Contact Guard/Touching assist     Locomotion Ambulation   Ambulation assist  Assist level: Contact Guard/Touching assist Assistive device: Walker-rolling Max distance: 150'   Walk 10 feet activity   Assist     Assist level: Contact Guard/Touching assist Assistive device: Walker-rolling   Walk 50 feet activity   Assist    Assist level: Contact Guard/Touching assist Assistive device: Walker-rolling    Walk 150 feet activity   Assist    Assist level: Contact Guard/Touching assist Assistive  device: Walker-rolling    Walk 10 feet on uneven surface  activity   Assist     Assist level: Contact Guard/Touching assist Assistive device: Walker-rolling   Wheelchair     Assist Will patient use wheelchair at discharge?: No             Wheelchair 50 feet with 2 turns activity    Assist            Wheelchair 150 feet activity     Assist          Blood pressure 110/68, pulse 71, temperature 98.7 F (37.1 C), resp. rate 14, height 6\' 1"  (1.854 m), weight 92.9 kg, SpO2 93 %.  Medical Problem List and Plan: 1.  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  Planning loop recorder.             -patient may  Shower after covered loop recorder- pt really wants hair washed  2/16- first day of therapies- pt ready.  2/18- walking with OT/RW- doing great             -ELOS/Goals: 16-18 days mod I 2.  Antithrombotics: -DVT/anticoagulation: SCDs             -antiplatelet therapy: Aspirin 81 mg daily and Plavix 75 mg day x3 weeks and then Plavix alone 3. Pain Management: Tylenol as needed  2/17- will add tramadol 50 mg q6 hours prn for pain 4. Mood: Trazodone 50 mg nightly, melatonin 3 mg nightly             -antipsychotic agents: N/A 5. Neuropsych: This patient is capable of making decisions on his own behalf. 6. Skin/Wound Care: Routine skin checks 7. Fluids/Electrolytes/Nutrition: Routine in and outs with follow-up chemistries 8.  Dysphagia.  Dysphagia #1 honey thick liquids.  Follow-up speech therapy. Will check Cr/BUN in Am- if significantly elevated, might need night IVFs for now Monitor hydration  2/16- BUN and Cr NOT elevated so will con't to monitor 2x/week and add IVFs if cannot keep up his hydration with honey thick liquids.   2/17 MBS today to see if can upgrade diet/liquids  2/18- is now on nectar thick liquids- has been upgraded  9.  Hyperlipidemia.  Lipitor 10.  Hypothyroidism.  Continue  Tapazole 11.  Tobacco abuse.  NicoDerm patch.  Provide counseling- smoked 2ppd- ok with Nicoderm  2/17- asked if could increase Patch- we cannot, but will add Gum if possible 12.  BPH.  Flomax 0.4 mg daily.  Check PVR 13.  GERD.  Protonix 14.  CAD with history of stenting.  Continue Plavix and aspirin 15. Thrush? Will try Diflucan 200 mg x 7 days. Might require nystatin swish and spit  2/16- says mouth feels a little better- con't Diflucan  2/17 - will add Nystatin swish and spit, since cannot swallow it.   2/18- plaque is coming off tongue- 50% gone as of today 16. Constipation- asked for Colace 17. Anxiety/depression  2/16- common in new stroke patients- will have pt seen by Neuropsych and start Paxil  10 mg QHS for both Sx's.  2/17- says worried might be "something else"- feels it in epigastric area? Doesn't sounds like GERD  2/18- will change to Celexa per pt request.  18. Muscle spasms  2/17- Will add Baclofen 5 mg QID for spasticity/muscle spasms  2/18- muscle spasms 50-75% better  18. Dispo  2/16- will d/c IV for now- bothering pt.   2/18- will give B12 shot- is due and Tuesday will do R shoulder injection    LOS: 3 days A FACE TO FACE EVALUATION WAS PERFORMED  Jaylene Arrowood 02/27/2020, 8:43 AM

## 2020-02-27 NOTE — Progress Notes (Signed)
Occupational Therapy Session Note  Patient Details  Name: David Hendricks MRN: [id number] Date of Birth: 1950-04-03  Today's Date: 02/27/2020 OT Individual Time: 0700-0800 OT Individual Time Calculation (min): 60 min    Short Term Goals: Week 1:  OT Short Term Goal 1 (Week 1): Pt will complete LB bathe/dress close Supervision OT Short Term Goal 2 (Week 1): Pt will perform functional mobility in prep for ADL with LRAD w/ CS OT Short Term Goal 3 (Week 1): Pt will perform 3/3 toileting tasks with CS OT Short Term Goal 4 (Week 1): Pt will exhibit good safety awareness during ADL with no more than occasional cues  Skilled Therapeutic Interventions/Progress Updates:    Pt sleeping upon arrival-min verbal cues to arouse. Pt requested coffee before starting, stating that he "can't do anything without my coffee." Coffee mixed to nectar thick consistency. Pt declined bathing this morning but did change clothing. Dressing at [facility] including fastening shoes. Pt amb with RW to day room (CGA with min verbal cues for posture). Pt presented with colored peg board tasks (medium difficulty). Pt with significant difficulty replicating pattern with no awareness. Pt required max verbal cues and was unable to complete task successfully. Pt returned to room and sat in recliner. MD present and pt recalled concerns he discussed earlier with therapist. Pt also recalled where thickener was stored when preparing coffee. Pt remained in recliner with all needs within reach and seat alarm activated.  Therapy Documentation Precautions:  Precautions Precautions: Fall Precaution Comments: Impulsive Restrictions Weight Bearing Restrictions: No   Pain: Pain Assessment Pain Scale: 0-10 Pain Score: 3  Pain Type: Chronic pain Pain Location: Shoulder Pain Orientation: Right Pain Descriptors / Indicators: Aching Pain Frequency: Constant Pain Onset: On-going Pain Intervention(s): MD aware   Therapy/Group: Individual  Therapy  [name] 02/27/2020, 9:12 AM

## 2020-02-27 NOTE — Progress Notes (Signed)
Physical Therapy Session Note  Patient Details  Name: David Hendricks MRN: [id number] Date of Birth: 05-15-1950  Today's Date: 02/27/2020 PT Individual Time: 0905-0959 PT Individual Time Calculation (min): 54 min   Short Term Goals: Week 1:  PT Short Term Goal 1 (Week 1): Pt will complete least restrictive transfer at Supervision level PT Short Term Goal 2 (Week 1): Pt will ambulate x 150 ft with LRAD and Supervision PT Short Term Goal 3 (Week 1): Pt will complete objective balance measure to determine fall risk  Skilled Therapeutic Interventions/Progress Updates: Pt presented in recliner agreeable to therapy. Pt denies pain throughout session. Performed STS transfer from recliner with CGA and RW and ambulated to day room with CGA. Pt continues to require cues for increasing BOS but no scissoring nor LOB. Participated in obstacle course with and without RW including thresholds and weaving through cones. Participated in cornhole on level tile for dynamic reaching and crossing midline, then progressed to several rounds while standing on red wedge for compliant surface. Pt requiring minA progressing to CGA with pt correcting posterior bias with verbal cues. Participated in static stand on [employer] while attempting to put lego blocks together in designated pattern. Pt required cues approx 75% of time for correct placement of blocks on first trial. On second trial pt was able to easier coordinate pattern requiring cues, then completed task in sitting in sitting with increased time. Pt then ambulated back to room without AD and CGA with pt able to find room after providing room number. Pt returned to recliner at end of session and left with seat alarm on, call bell within reach and needs met.      Therapy Documentation Precautions:  Precautions Precautions: Fall Precaution Comments: Impulsive Restrictions Weight Bearing Restrictions: No General:   Vital Signs: Therapy Vitals Temp: 97.6 F (36.4  C) Pulse Rate: 84 Resp: 16 BP: 127/69 Patient Position (if appropriate): Lying Oxygen Therapy SpO2: 95 % O2 Device: Room Air    Therapy/Group: Individual Therapy  Micco Bourbeau 02/27/2020, 3:59 PM

## 2020-02-27 NOTE — IPOC Note (Signed)
Overall Plan of Care [facility]) Patient Details Name: David Hendricks MRN: [id number] DOB: Sep 10, 1950  Admitting Diagnosis: Right middle cerebral artery stroke [facility])  Hospital Problems: Principal Problem:   Right middle cerebral artery stroke [facility])     Functional Problem List: Nursing Nutrition,Medication Management,Bladder,Safety,Endurance  PT Balance,Endurance,Safety  OT Balance,Cognition,Endurance,Motor,Safety,Sensory,Vision  SLP Cognition  TR         Basic ADL's: OT Eating,Grooming,Bathing,Dressing,Toileting     Advanced  ADL's: OT Simple Meal Preparation,Light Housekeeping     Transfers: PT Bed Mobility,Bed to [employer]  OT Toilet,Tub/Shower     Locomotion: PT Ambulation,Wheelchair Mobility,Stairs     Additional Impairments: OT Fuctional Use of Upper Extremity  SLP Swallowing      TR      Anticipated Outcomes Item Anticipated Outcome  Self Feeding Indep  Swallowing  mod I   Basic self-care  Mod I  Toileting  Mod I   Bathroom Transfers Mod I  Bowel/Bladder  mod I assist to manage bladder  Transfers  mod I  Locomotion  mod I with LRAD  Communication  mod I  Cognition  mod I  Pain  at or below level 3  Safety/Judgment  cues/reminders   Therapy Plan: PT Intensity: Minimum of 1-2 x/day ,45 to 90 minutes PT Frequency: 5 out of 7 days PT Duration Estimated Length of Stay: 10-12 OT Intensity: Minimum of 1-2 x/day, 45 to 90 minutes OT Frequency: 5 out of 7 days OT Duration/Estimated Length of Stay: 10-14 days SLP Intensity: Minumum of 1-2 x/day, 30 to 90 minutes SLP Frequency: 3 to 5 out of 7 days SLP Duration/Estimated Length of Stay: 10-14 days   Due to the current state of emergency, patients may not be receiving their 3-hours of Medicare-mandated therapy.   Team Interventions: Nursing Interventions Patient/Family Education,Dysphagia/Aspiration Precaution Training,Disease Management/Prevention,Medication Management,Discharge  Planning  PT interventions Ambulation/gait training,Balance/vestibular training,Cognitive remediation/compensation,Community reintegration,Discharge planning,DME/adaptive equipment instruction,Functional mobility training,Neuromuscular re-education,Patient/family education,Psychosocial support,Stair training,Therapeutic Activities,Therapeutic Exercise,UE/LE Strength taining/ROM,UE/LE Coordination activities  OT Interventions Balance/vestibular training,Discharge planning,Pain management,Self Care/advanced ADL retraining,Therapeutic Activities,UE/LE Coordination activities,Visual/perceptual remediation/compensation,Therapeutic Exercise,Skin care/wound managment,Patient/family education,Functional mobility training,Disease mangement/prevention,Cognitive remediation/compensation,Community reintegration,DME/adaptive equipment instruction,Neuromuscular re-education,Psychosocial support,UE/LE Strength taining/ROM  SLP Interventions Cognitive remediation/compensation,Dysphagia/aspiration precaution training,Medication managment,Functional tasks,Patient/family education  TR Interventions    SW/CM Interventions Discharge Planning,Psychosocial Support,Patient/Family Education   Barriers to Discharge MD  Medical stability, Home enviroment access/loayout, Lack of/limited family support, Weight and thrush, anxiety, R shoulder pain  Nursing Decreased caregiver support,Home environment access/layout    PT Decreased caregiver support,Lack of/limited family support    OT Decreased caregiver support    SLP Decreased caregiver support patient lives alone and his brother and sister in law live out of state  SW Decreased caregiver support,Lack of/limited family support Pt will need to be Mod I at d/c as he has limited local supports. Pt can register for Medicaid transportation, and SW can explore other resources such as PCS if eligible at time of discharge.   Team Discharge Planning: Destination: PT-Home ,OT- Home ,  SLP-Home Projected Follow-up: PT-Home health PT, OT-  Home health OT, SLP-Other (comment) (TBD) Projected Equipment Needs: PT-Rolling walker with 5" wheels, OT- To be determined,Tub/shower bench, SLP-None recommended by SLP Equipment Details: PT-TBD pending progress, OT-TBD if needs TTB or shower seat Patient/family involved in discharge planning: PT- Patient,Family member/caregiver,  OT-Patient,Family member/caregiver, SLP-Patient,Family member/caregiver  MD ELOS: 10-12 days Medical Rehab Prognosis:  Good Assessment: Pt is a 70 yr old male with 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   Goals Mod I by d/c.      See Team Conference Notes for weekly updates to the plan of care

## 2020-02-27 NOTE — Progress Notes (Signed)
Speech Language Pathology Daily Session Note  Patient Details  Name: David Hendricks MRN: [id number] Date of Birth: January 20, 1950  Today's Date: 02/27/2020 SLP Individual Time: 1400-1500 SLP Individual Time Calculation (min): 60 min  Short Term Goals: Week 1: SLP Short Term Goal 1 (Week 1): Patient will perform swallow safety precautions with supervision A for accuracy and consistency. SLP Short Term Goal 2 (Week 1): Patient will tolerate Dys 2 solids, nectar thick liquids with minA. SLP Short Term Goal 3 (Week 1): Patient will particpiate in higher level problem solving tasks (medication management, financial management, etc) with minA. SLP Short Term Goal 4 (Week 1): Patient will demonstrate adequate anticipatory awareness during ADL"s and when discussing discharge planning with minA.  Skilled Therapeutic Interventions:   Patient seen for skilled ST session focusing on dysphagia and cognitive goals. Patient informed SLP that he was feeling like his mouth would frequently fill up and he has been spitting out saliva/phlegm. SLP reviewed MBS films with patient and reviewed swallow precautions, as he had vallecular and pyriform sinus residuals with all consistencies during MBS and at times portion of bolus moved from vallecular sinus back into oral cavity. Patient seems more calm, less anxious and he reported that he thinks the medication (lexapro) his MD started him on has helped. Patient appears to be at or very near his cognitive baseline however will continue to assess to ensure that he will be at mod I level upon discharge from [facility] services.  Pain Pain Assessment Pain Scale: 0-10 Pain Score: 0-No pain  Therapy/Group: Individual Therapy  Sonia Baller, MA, CCC-SLP Speech Therapy

## 2020-02-28 MED ORDER — LIP MEDEX EX OINT
TOPICAL_OINTMENT | CUTANEOUS | Status: DC | PRN
  Filled 2020-02-28: qty 7

## 2020-02-28 NOTE — Progress Notes (Signed)
Occupational Therapy Session Note  Patient Details  Name: David Hendricks MRN: [id number] Date of Birth: 1950-03-29  Today's Date: 02/28/2020 OT Individual Time: 1101-1201 OT Individual Time Calculation (min): 60 min    Short Term Goals: Week 1:  OT Short Term Goal 1 (Week 1): Pt will complete LB bathe/dress close Supervision OT Short Term Goal 2 (Week 1): Pt will perform functional mobility in prep for ADL with LRAD w/ CS OT Short Term Goal 3 (Week 1): Pt will perform 3/3 toileting tasks with CS OT Short Term Goal 4 (Week 1): Pt will exhibit good safety awareness during ADL with no more than occasional cues  Skilled Therapeutic Interventions/Progress Updates:    Pt received semi-reclined in bed, agreeable to therapy, req to shower. Supine > sitting EOB with min A to lift trunk. STS with min A for initial lift, amb to shower and completed TTB transfer with close S to CGA for balance. Req just to wash hair this date, did so with close S. Donned/doffed B socks with close S via figure 4 position. Sat at sink to shave with LUE with close S, good control over razor. Declined getting dressed. Stand-pivot to recliner with close S + use of bed rail. Pt interested in receiving something stronger to squeeze with his L hand. Provided yellow soft resistance theraputty and demonstrated the following exercises: putty squeezes, digit abduction, pinch and pull for LUE NMR. Pt verbalized understanding and will attempt exercises over the weekend. Pt left in recliner with chair alarm engaged, call bell in reach, and all immediate needs met.    Therapy Documentation Precautions:  Precautions Precautions: Fall Precaution Comments: Impulsive Restrictions Weight Bearing Restrictions: No Pain: Pain Assessment Pain Scale: 0-10 Pain Score: Asleep Pain Type: Acute pain Pain Location: Ear Pain Orientation: Left Pain Radiating Towards: neck/throat Pain Descriptors / Indicators: Aching Patients Stated Pain Goal:  2 Pain Intervention(s): Medication (See eMAR) ADL: See Care Tool for more details.   Therapy/Group: Individual Therapy  Volanda Napoleon MS, OTR/L  02/28/2020, 6:53 AM

## 2020-02-28 NOTE — Progress Notes (Signed)
PROGRESS NOTE   Subjective/Complaints: Mr. Foti is feeling well He complains of some dry mouth No other complaints   ROS:  Pt denies SOB, abd pain, CP, N/V/C/D, and vision changes   Objective:   No results found. No results for input(s): WBC, HGB, HCT, PLT in the last 72 hours. Recent Labs    02/27/20 0520  NA 138  K 3.6  CL 99  CO2 29  GLUCOSE 101*  BUN 13  CREATININE 1.07  CALCIUM 9.0    Intake/Output Summary (Last 24 hours) at 02/28/2020 1447 Last data filed at 02/28/2020 1340 Gross per 24 hour  Intake 840 ml  Output 625 ml  Net 215 ml        Physical Exam: Vital Signs Blood pressure 135/64, pulse 86, temperature 97.6 F (36.4 C), resp. rate 17, height 6\' 1"  (1.854 m), weight 92.9 kg, SpO2 95 %.  Gen: still squinting- L facial droop; tongue 1/2 of plaque off tongue HEENT: oral mucosa pink and moist, NCAT Cardio: Reg rate Chest: normal effort, normal rate of breathing Abd: soft, non-distended Ext: no edema Psych: pleasant, normal affect Skin: intact Musculoskeletal:     Cervical back: Normal range of motion and neck supple.     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 Functional deficits which require 3+ hours per day of interdisciplinary therapy in a comprehensive inpatient rehab setting.  Physiatrist is providing close team supervision and 24 hour management of active medical problems listed  below.  Physiatrist and rehab team continue to assess barriers to discharge/monitor patient progress toward functional and medical goals  Care Tool:  Bathing    Body parts bathed by patient: Right arm,Left arm,Chest,Abdomen,Face (declined bathing LB)         Bathing assist Assist Level: Supervision/Verbal cueing     Upper Body Dressing/Undressing Upper body dressing   What is the patient wearing?: Pull over shirt    Upper body assist Assist Level: Set up assist    Lower Body Dressing/Undressing Lower body dressing      What is the patient wearing?: Pants     Lower body assist Assist for lower body dressing: Contact Guard/Touching assist     Toileting Toileting    Toileting assist Assist for toileting: Contact Guard/Touching assist     Transfers Chair/bed transfer  Transfers assist     Chair/bed transfer assist level: Supervision/Verbal cueing     Locomotion Ambulation   Ambulation assist      Assist level: Contact Guard/Touching assist Assistive device: Walker-rolling Max distance: 150'   Walk 10 feet activity   Assist     Assist level: Contact Guard/Touching assist Assistive device: Walker-rolling   Walk 50 feet activity  Assist    Assist level: Contact Guard/Touching assist Assistive device: Walker-rolling    Walk 150 feet activity   Assist    Assist level: Contact Guard/Touching assist Assistive device: Walker-rolling    Walk 10 feet on uneven surface  activity   Assist     Assist level: Contact Guard/Touching assist Assistive device: Walker-rolling   Wheelchair     Assist Will patient use wheelchair at discharge?: No             Wheelchair 50 feet with 2 turns activity    Assist            Wheelchair 150 feet activity     Assist          Blood pressure 135/64, pulse 86, temperature 97.6 F (36.4 C), resp. rate 17, height 6\' 1"  (1.854 m), weight 92.9 kg, SpO2 95 %.  Medical  Problem List and Plan: 1.  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  Planning loop recorder.             -patient may  Shower after covered loop recorder- pt really wants hair washed  2/16- first day of therapies- pt ready.  2/18- walking with OT/RW- doing great             -ELOS/Goals: 16-18 days mod I  -Continue CIR 2.  Antithrombotics: -DVT/anticoagulation: SCDs             -antiplatelet therapy: Continue Aspirin 81 mg daily and Plavix 75 mg day x3 weeks and then Plavix alone 3. Pain Management: Continue Tylenol as needed  2/17- will add tramadol 50 mg q6 hours prn for pain 4. Mood: Trazodone 50 mg nightly, melatonin 3 mg nightly             -antipsychotic agents: N/A 5. Neuropsych: This patient is capable of making decisions on his own behalf. 6. Skin/Wound Care: Routine skin checks 7. Fluids/Electrolytes/Nutrition: Routine in and outs with follow-up chemistries 8.  Dysphagia.  Dysphagia #1 honey thick liquids.  Follow-up speech therapy. Will check Cr/BUN in Am- if significantly elevated, might need night IVFs for now Monitor hydration  2/16- BUN and Cr NOT elevated so will con't to monitor 2x/week and add IVFs if cannot keep up his hydration with honey thick liquids.   2/17 MBS today to see if can upgrade diet/liquids  2/18- is now on nectar thick liquids- has been upgraded  9.  Hyperlipidemia.  Lipitor 10.  Hypothyroidism.  Continue Tapazole 11.  Tobacco abuse.  NicoDerm patch.  Provide counseling- smoked 2ppd- ok with Nicoderm  2/17- asked if could increase Patch- we cannot, but will add Gum if possible 12.  BPH.  Flomax 0.4 mg daily.  Check PVR 13.  GERD.  Protonix 14.  CAD with history of stenting.  Continue Plavix and aspirin 15. Thrush? Will try Diflucan 200 mg x 7 days. Might require nystatin swish and spit  2/16- says mouth feels a little better- con't Diflucan  2/17 - will add Nystatin swish and spit, since cannot swallow  it.   2/18- plaque is coming off tongue- 50% gone as of today 16. Constipation- asked for Colace 17. Anxiety/depression  2/16- common in new stroke patients- will have pt seen by Neuropsych and start Paxil 10 mg QHS for both Sx's.  2/17- says worried might be "something else"- feels it in epigastric area? Doesn't sounds like GERD  2/18- will change to Celexa per pt request.  18. Muscle  spasms  2/17- Will add Baclofen 5 mg QID for spasticity/muscle spasms  2/18- muscle spasms 50-75% better  19. Xerostomia: lip balm ordered to help moisterize 20. Dispo  2/16- will d/c IV for now- bothering pt.   2/18- will give B12 shot- is due and Tuesday will do R shoulder injection    LOS: 4 days A FACE TO FACE EVALUATION WAS PERFORMED  Clide Deutscher Ronny Korff 02/28/2020, 2:47 PM

## 2020-02-28 NOTE — Progress Notes (Signed)
Loop recorder dressing soiled, changed with tegaderm. No complications noted.  Audie Clear, LPN

## 2020-02-29 NOTE — Progress Notes (Signed)
Occupational Therapy Session Note  Patient Details  Name: David Hendricks MRN: [id number] Date of Birth: [birth year]  Today's Date: 02/29/2020 OT Individual Time: 0800-0900 OT Individual Time Calculation (min): 60 min    Short Term Goals: Week 1:  OT Short Term Goal 1 (Week 1): Pt will complete LB bathe/dress close Supervision OT Short Term Goal 2 (Week 1): Pt will perform functional mobility in prep for ADL with LRAD w/ CS OT Short Term Goal 3 (Week 1): Pt will perform 3/3 toileting tasks with CS OT Short Term Goal 4 (Week 1): Pt will exhibit good safety awareness during ADL with no more than occasional cues  Skilled Therapeutic Interventions/Progress Updates:    Pt resting in bed upon arrival. Pt declined bathing this morning but requested a fresh cup of coffee. Pt provided coffee with creamer and sugar. Pt able to open all containers. Pt used LUE to bring cup to mouth. Pt donned clothing with supervision and amb with RW to Day Room. Pt engaged in theraputty tasks with soft (yellow) putty, including placing small beads in putty and removing. Pt stated half way through task, "this isn't fun anymore", but continued to complete task. Pt also rolled putty out and worked on grip activities with putty. Pt returned to room and sat in recliner. Pt remained in recliner with all needs within reach and seat alamr activated.   Therapy Documentation Precautions:  Precautions Precautions: Fall Precaution Comments: Impulsive Restrictions Weight Bearing Restrictions: No  Pain:  Pt denies pain this morning  Therapy/Group: Individual Therapy  [name] 02/29/2020, 9:01 AM

## 2020-02-29 NOTE — Progress Notes (Signed)
PROGRESS NOTE   Subjective/Complaints: No complaints  ROS:  Pt denies SOB, abd pain, CP, N/V/C/D, and vision changes   Objective:   No results found. No results for input(s): WBC, HGB, HCT, PLT in the last 72 hours. Recent Labs    02/27/20 0520  NA 138  K 3.6  CL 99  CO2 29  GLUCOSE 101*  BUN 13  CREATININE 1.07  CALCIUM 9.0    Intake/Output Summary (Last 24 hours) at 02/29/2020 1027 Last data filed at 02/29/2020 0825 Gross per 24 hour  Intake 790 ml  Output 675 ml  Net 115 ml        Physical Exam: Vital Signs Blood pressure 125/68, pulse 70, temperature (!) 97.5 F (36.4 C), temperature source Oral, resp. rate 18, height 6\' 1"  (1.854 m), weight 92.9 kg, SpO2 91 %.  Gen: no distress, normal appearing HEENT: oral mucosa pink and moist, NCAT Cardio: Reg rate Chest: normal effort, normal rate of breathing Abd: soft, non-distended Ext: no edema Psych: pleasant, normal affect Skin: intact Musculoskeletal:     Cervical back: Normal range of motion and neck supple.     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 Functional deficits which require 3+ hours per day of interdisciplinary therapy in a comprehensive inpatient rehab setting.  Physiatrist is providing close team supervision and 24 hour management of active medical problems listed below.  Physiatrist and rehab team continue to assess barriers to discharge/monitor  patient progress toward functional and medical goals  Care Tool:  Bathing    Body parts bathed by patient: Right arm,Left arm,Chest,Abdomen,Face (declined bathing LB)         Bathing assist Assist Level: Supervision/Verbal cueing     Upper Body Dressing/Undressing Upper body dressing   What is the patient wearing?: Pull over shirt    Upper body assist Assist Level: Set up assist    Lower Body Dressing/Undressing Lower body dressing      What is the patient wearing?: Pants     Lower body assist Assist for lower body dressing: Contact Guard/Touching assist     Toileting Toileting    Toileting assist Assist for toileting: Contact Guard/Touching assist     Transfers Chair/bed transfer  Transfers assist     Chair/bed transfer assist level: Supervision/Verbal cueing     Locomotion Ambulation   Ambulation assist      Assist level: Contact Guard/Touching assist Assistive device: Walker-rolling Max distance: 150'   Walk 10 feet activity   Assist     Assist level: Contact Guard/Touching assist Assistive device: Walker-rolling   Walk 50 feet activity   Assist    Assist level: Contact Guard/Touching assist Assistive device: Walker-rolling  Walk 150 feet activity   Assist    Assist level: Contact Guard/Touching assist Assistive device: Walker-rolling    Walk 10 feet on uneven surface  activity   Assist     Assist level: Contact Guard/Touching assist Assistive device: Walker-rolling   Wheelchair     Assist Will patient use wheelchair at discharge?: No             Wheelchair 50 feet with 2 turns activity    Assist            Wheelchair 150 feet activity     Assist          Blood pressure 125/68, pulse 70, temperature (!) 97.5 F (36.4 C), temperature source Oral, resp. rate 18, height 6\' 1"  (1.854 m), weight 92.9 kg, SpO2 91 %.  Medical Problem List and Plan: 1.  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  Planning loop recorder.             -patient may  Shower after covered loop recorder- pt really wants hair washed  2/16- first day of therapies- pt ready.  2/18- walking with OT/RW- doing great             -ELOS/Goals: 16-18 days mod I  -Continue CIR 2.  Antithrombotics: -DVT/anticoagulation: SCDs             -antiplatelet therapy: Continue Aspirin 81 mg daily and Plavix 75 mg day x3 weeks and then Plavix alone 3. Pain Management: Continue Tylenol as needed  2/20- Continue tramadol 50 mg q6 hours prn for pain 4. Mood: Trazodone 50 mg nightly, melatonin 3 mg nightly             -antipsychotic agents: N/A 5. Neuropsych: This patient is capable of making decisions on his own behalf. 6. Skin/Wound Care: Routine skin checks. Loop recorder soiled- dressing changed 7. Fluids/Electrolytes/Nutrition: Routine in and outs with follow-up chemistries 8.  Dysphagia.  Dysphagia #1 honey thick liquids.  Follow-up speech therapy. Will check Cr/BUN in Am- if significantly elevated, might need night IVFs for now Monitor hydration  2/16- BUN and Cr NOT elevated so will con't to monitor 2x/week and add IVFs if cannot keep up his hydration with honey thick liquids.   2/17 MBS today to see if can upgrade diet/liquids  2/18- is now on nectar thick liquids- has been upgraded  9.  Hyperlipidemia.  Lipitor 10.  Hypothyroidism.  Continue Tapazole 11.  Tobacco abuse.  NicoDerm patch.  Provide counseling- smoked 2ppd- ok with Nicoderm  2/17- asked if could increase Patch- we cannot, but will add Gum if possible 12.  BPH.  Flomax 0.4 mg daily.  Check PVR 13.  GERD.  Protonix 14.  CAD with history of stenting.  Continue Plavix and aspirin 15. Thrush? Will try Diflucan 200 mg x 7 days. Might require nystatin swish and spit  2/16- says mouth feels a little better- con't Diflucan  2/17 - will add Nystatin swish and spit, since cannot swallow it.   2/18- plaque is  coming off tongue- 50% gone as of today 16. Constipation- asked for Colace 17. Anxiety/depression  2/16- common in new stroke patients- will have pt seen by Neuropsych and start Paxil 10 mg QHS for both Sx's.  2/17- says worried might be "something else"- feels it in epigastric area? Doesn't sounds like GERD  2/18- will change to Celexa per pt request.  18. Muscle spasms  2/17- Will add Baclofen 5  mg QID for spasticity/muscle spasms  2/18- muscle spasms 50-75% better  19. Xerostomia: lip balm ordered to help moisterize 20. Dispo  2/16- will d/c IV for now- bothering pt.   2/18- will give B12 shot- is due and Tuesday will do R shoulder injection    LOS: 5 days A FACE TO FACE EVALUATION WAS PERFORMED  David Hendricks 02/29/2020, 10:27 AM

## 2020-02-29 NOTE — Progress Notes (Signed)
Speech Language Pathology Daily Session Note  Patient Details  Name: David Hendricks MRN: [id number] Date of Birth: [birth year]  Today's Date: 02/29/2020 SLP Individual Time: 1300-1340 SLP Individual Time Calculation (min): 40 min  Short Term Goals: Week 1: SLP Short Term Goal 1 (Week 1): Patient will perform swallow safety precautions with supervision A for accuracy and consistency. SLP Short Term Goal 2 (Week 1): Patient will tolerate Dys 2 solids, nectar thick liquids with minA. SLP Short Term Goal 3 (Week 1): Patient will particpiate in higher level problem solving tasks (medication management, financial management, etc) with minA. SLP Short Term Goal 4 (Week 1): Patient will demonstrate adequate anticipatory awareness during ADL"s and when discussing discharge planning with minA.  Skilled Therapeutic Interventions: Pt was seen for skilled ST targeting cognitive goals. SLP facilitated session with beginning of a complex medication management task. Pt required Min-Mod A verbal cues for recall of names and functions of current medications. SLP assisted pt in using a handwritten list of current meds as compensatory strategy to improve future recall. He did recognize that his med management strategy at home has not been working well and sometimes he "mixes up the bottles". Therefore, SLP introduced concept of a pill box organizer. Due to time constraints, pt unable to use list to organize QID pill box from start to finish, however after introduction to the pill box from SLP, he demonstrated via pointing how he would arrange 1X am vs 4X daily pills within the box. Would recommend using list to organize QID pill box using fake pills for more realistic simulation at next available session. Continue per current plan of care.        Pain Pain Assessment Pain Scale: Faces Faces Pain Scale: No hurt  Therapy/Group: Individual Therapy  Arbutus Leas 02/29/2020, 7:29 AM

## 2020-02-29 NOTE — Progress Notes (Signed)
Occupational Therapy Session Note  Patient Details  Name: David Hendricks MRN: [id number] Date of Birth: 06-29-1950  Today's Date: 02/29/2020 OT Individual Time: 1400-1430 OT Individual Time Calculation (min): 30 min    Short Term Goals: Week 1:  OT Short Term Goal 1 (Week 1): Pt will complete LB bathe/dress close Supervision OT Short Term Goal 2 (Week 1): Pt will perform functional mobility in prep for ADL with LRAD w/ CS OT Short Term Goal 3 (Week 1): Pt will perform 3/3 toileting tasks with CS OT Short Term Goal 4 (Week 1): Pt will exhibit good safety awareness during ADL with no more than occasional cues  Skilled Therapeutic Interventions/Progress Updates:    Pt resting in recliner upon arrival. Pt requested to return to bed but he was waiting for linens to be changed. Pt agreed to assist with changing linens. Pt donned non-slip socks and walked to EOB wihtout AD. Pt was able to put all four corners of fitted sheet on bed after assistance with orientation. Pt also placed pillow cases on 3 pillows. Pt walked around EOB to properly position all linens. No AD. Pt did used bed rails for support when walking around bed. Pt returned to bed. Pt remained in bed with all needs within reach and bed alarm activated. Pt completed all tasks with supervision.  Therapy Documentation Precautions:  Precautions Precautions: Fall Precaution Comments: Impulsive Restrictions Weight Bearing Restrictions: No Pain: Pain Assessment Pain Scale: Faces Faces Pain Scale: No hurt   Therapy/Group: Individual Therapy  [name] 02/29/2020, 2:42 PM

## 2020-02-29 NOTE — Progress Notes (Signed)
Physical Therapy Session Note  Patient Details  Name: David Hendricks MRN: [id number] Date of Birth: 1950-04-01  Today's Date: 02/29/2020 PT Individual Time: 1100-1200 PT Individual Time Calculation (min): 60 min   Short Term Goals: Week 1:  PT Short Term Goal 1 (Week 1): Pt will complete least restrictive transfer at Supervision level PT Short Term Goal 2 (Week 1): Pt will ambulate x 150 ft with LRAD and Supervision PT Short Term Goal 3 (Week 1): Pt will complete objective balance measure to determine fall risk  Skilled Therapeutic Interventions/Progress Updates:    Pt received seated in recliner in room, agreeable to PT session. No complaints of pain. Pt able to don shirt and socks while seated in chair independently. Sit to stand with CGA and no AD throughout session. Ambulation 2 x 125 ft, 1 x 200 ft with no AD and CGA, occasional ataxic and trunk flexed forward, no LOB. Pt reports onset of fatigue fairly quickly with gait and requests frequent seated rest breaks. Standing squats 2 x 10 reps with min A for balance, occasional poster LOB and onset of quad cramping. Pt tends to widen BOS with LOB in order to prevent fall. Standing alt L/R 4" step-taps, 3 x 10 reps with no AD and min A for balance for LE coordination training. Sidesteps L/R 2 x 30 ft each direction with CGA, forward/bacwkard ambulation 4 x 15 ft each direction with CGA. Four-square stepping x 3 rounds each direction with min A for balance, cues for decreased speed and increased control of BLE. Pt left seated in recliner in room with needs in reach, chair alarm in place at end of session.  Therapy Documentation Precautions:  Precautions Precautions: Fall Precaution Comments: Impulsive Restrictions Weight Bearing Restrictions: No   Therapy/Group: Individual Therapy   Excell Seltzer, PT, DPT  02/29/2020, 12:25 PM

## 2020-03-01 LAB — CBC WITH DIFFERENTIAL/PLATELET
Abs Immature Granulocytes: 0.03 10*3/uL (ref 0.00–0.07)
Basophils Absolute: 0.1 10*3/uL (ref 0.0–0.1)
Basophils Relative: 1 %
Eosinophils Absolute: 0.3 10*3/uL (ref 0.0–0.5)
Eosinophils Relative: 3 %
HCT: 48.2 % (ref 39.0–52.0)
Hemoglobin: 16.5 g/dL (ref 13.0–17.0)
Immature Granulocytes: 0 %
Lymphocytes Relative: 28 %
Lymphs Abs: 2.4 10*3/uL (ref 0.7–4.0)
MCH: 29.2 pg (ref 26.0–34.0)
MCHC: 34.2 g/dL (ref 30.0–36.0)
MCV: 85.2 fL (ref 80.0–100.0)
Monocytes Absolute: 0.6 10*3/uL (ref 0.1–1.0)
Monocytes Relative: 6 %
Neutro Abs: 5.2 10*3/uL (ref 1.7–7.7)
Neutrophils Relative %: 62 %
Platelets: 153 10*3/uL (ref 150–400)
RBC: 5.66 MIL/uL (ref 4.22–5.81)
RDW: 12.7 % (ref 11.5–15.5)
WBC: 8.6 10*3/uL (ref 4.0–10.5)
nRBC: 0 % (ref 0.0–0.2)

## 2020-03-01 LAB — BASIC METABOLIC PANEL
Anion gap: 8 (ref 5–15)
BUN: 12 mg/dL (ref 8–23)
CO2: 32 mmol/L (ref 22–32)
Calcium: 9.1 mg/dL (ref 8.9–10.3)
Chloride: 98 mmol/L (ref 98–111)
Creatinine, Ser: 1.02 mg/dL (ref 0.61–1.24)
GFR, Estimated: >60 mL/min (ref >60)
Glucose, Bld: 87 mg/dL (ref 70–99)
Potassium: 3.7 mmol/L (ref 3.5–5.1)
Sodium: 138 mmol/L (ref 135–145)

## 2020-03-01 MED ORDER — LIDOCAINE HCL URETHRAL/MUCOSAL 2 % EX GEL: CUTANEOUS | Status: DC | PRN

## 2020-03-01 MED ORDER — LIDOCAINE HCL URETHRAL/MUCOSAL 2 % EX PRSY
1.0000 "application " | PREFILLED_SYRINGE | Freq: Once | CUTANEOUS | Status: AC
  Administered 2020-03-01: 1 via URETHRAL
  Filled 2020-03-01: qty 5

## 2020-03-01 MED ORDER — FLEET ENEMA 7-19 GM/118ML RE ENEM: 1.0000 | ENEMA | Freq: Once | RECTAL | Status: DC

## 2020-03-01 MED ORDER — BISACODYL 10 MG RE SUPP
10.0000 mg | Freq: Every day | RECTAL | Status: DC | PRN
  Administered 2020-03-01 – 2020-03-08 (×4): 10 mg via RECTAL
  Filled 2020-03-01 (×4): qty 1

## 2020-03-01 MED ORDER — FLEET ENEMA 7-19 GM/118ML RE ENEM: 1.0000 | ENEMA | Freq: Every day | RECTAL | Status: DC | PRN

## 2020-03-01 MED ORDER — SORBITOL 70 % SOLN
30.0000 mL | Freq: Once | Status: AC
  Administered 2020-03-01: 30 mL via ORAL
  Filled 2020-03-01: qty 30

## 2020-03-01 NOTE — Progress Notes (Signed)
Speech Language Pathology Daily Session Note  Patient Details  Name: David Hendricks MRN: [id number] Date of Birth: 04-22-1950  Today's Date: 03/01/2020 SLP Individual Time: 6606-3016 SLP Individual Time Calculation (min): 44 min  Short Term Goals: Week 1: SLP Short Term Goal 1 (Week 1): Patient will perform swallow safety precautions with supervision A for accuracy and consistency. SLP Short Term Goal 2 (Week 1): Patient will tolerate Dys 2 solids, nectar thick liquids with minA. SLP Short Term Goal 3 (Week 1): Patient will particpiate in higher level problem solving tasks (medication management, financial management, etc) with minA. SLP Short Term Goal 4 (Week 1): Patient will demonstrate adequate anticipatory awareness during ADL"s and when discussing discharge planning with minA.  Skilled Therapeutic Interventions: Pt seen for skilled ST with focus on dysphagia and cognitive goals, pt c/o new onset blurry vision this AM, medical staff is aware. Pt consuming trials thin water via cup, independent for small sips. Pt with apparently timely swallow initiation, no overt s/s aspiration or change in vocal quality across 3 oz trials. SLP providing ongoing education re: results of MBS, current dietary restrictions, risk of aspiration/PNA and safe swallow strategies. SLP and patient attempting med management task with QID pill box, however pt unable to fully participate 2' vision issues this date. SLP facilitating recall of med list/function of meds by providing mod A verbal cues for ~50% accuracy. Pt left in bed with alarm set and all needs within reach. Cont ST POC.   Pain Pain Assessment Pain Scale: 0-10 Pain Score: 0-No pain Pain Type: Acute pain Pain Location: Shoulder Pain Orientation: Left Pain Descriptors / Indicators: Aching;Discomfort Pain Frequency: Rarely Pain Onset: Gradual Patients Stated Pain Goal: 2 Pain Intervention(s): Medication (See eMAR)  Therapy/Group: Individual  Therapy  Dewaine Conger 03/01/2020, 9:45 AM

## 2020-03-01 NOTE — Progress Notes (Signed)
PROGRESS NOTE   Subjective/Complaints:  Eyes are blurry- B/L- not worse on 1 side than another. Is progressively worse than last week.   R shoulder hurting; wants steroid injection tomorrow.   LBM 2 days ago- feels stopped up-   ROS:   Pt denies SOB, abd pain, CP, N/V/C/D, and vision changes    Objective:   No results found. Recent Labs    03/01/20 0511  WBC 8.6  HGB 16.5  HCT 48.2  PLT 153   Recent Labs    03/01/20 0511  NA 138  K 3.7  CL 98  CO2 32  GLUCOSE 87  BUN 12  CREATININE 1.02  CALCIUM 9.1    Intake/Output Summary (Last 24 hours) at 03/01/2020 0830 Last data filed at 03/01/2020 0734 Gross per 24 hour  Intake 960 ml  Output 900 ml  Net 60 ml        Physical Exam: Vital Signs Blood pressure 122/70, pulse 71, temperature 97.6 F (36.4 C), temperature source Oral, resp. rate 16, height 6\' 1"  (1.854 m), weight 92.9 kg, SpO2 90 %.  Gen: laying supine in bed; appropriate, NAD HEENT: oral mucosa pink and moist, NCAT- c/o blurry vision- hard to assess, but my features were blurry at 5-7 ft from pt.  Cardio: RRR_ no JVD Chest: CTA B/L- no W/R/R- good air movement Abd: soft, NT, distended vs protuberant, hypoactive BS Ext: no edema Psych:appropriate Skin: intact Musculoskeletal:     Cervical back: Normal range of motion and neck supple.     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  Light touch intact x 4 extremities       Assessment/Plan: 1. Functional deficits which require 3+ hours per day of  interdisciplinary therapy in a comprehensive inpatient rehab setting.  Physiatrist is providing close team supervision and 24 hour management of active medical problems listed below.  Physiatrist and rehab team continue to assess barriers to discharge/monitor patient progress toward functional and medical goals  Care Tool:  Bathing    Body parts bathed by patient: Right arm,Left arm,Chest,Abdomen,Face (declined bathing LB)         Bathing assist Assist Level: Supervision/Verbal cueing     Upper Body Dressing/Undressing Upper body dressing   What is the patient wearing?: Pull over shirt    Upper body assist Assist Level: Set up assist    Lower Body Dressing/Undressing Lower body dressing      What is the patient wearing?: Pants     Lower body assist Assist for lower body dressing: Contact Guard/Touching assist     Toileting Toileting    Toileting assist Assist for toileting: Contact Guard/Touching assist     Transfers Chair/bed transfer  Transfers assist     Chair/bed transfer assist level: Contact Guard/Touching assist     Locomotion  Ambulation   Ambulation assist      Assist level: Contact Guard/Touching assist Assistive device: No Device Max distance: 200'   Walk 10 feet activity   Assist     Assist level: Contact Guard/Touching assist Assistive device: No Device   Walk 50 feet activity   Assist    Assist level: Contact Guard/Touching assist Assistive device: No Device    Walk 150 feet activity   Assist    Assist level: Contact Guard/Touching assist Assistive device: No Device    Walk 10 feet on uneven surface  activity   Assist     Assist level: Contact Guard/Touching assist Assistive device: Walker-rolling   Wheelchair     Assist Will patient use wheelchair at discharge?: No             Wheelchair 50 feet with 2 turns activity    Assist            Wheelchair 150 feet activity      Assist          Blood pressure 122/70, pulse 71, temperature 97.6 F (36.4 C), temperature source Oral, resp. rate 16, height 6\' 1"  (1.854 m), weight 92.9 kg, SpO2 90 %.  Medical Problem List and Plan: 1.  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  Planning loop recorder.             -patient may  Shower after covered loop recorder- pt really wants hair washed  2/16- first day of therapies- pt ready.  2/18- walking with OT/RW- doing great             -ELOS/Goals: 16-18 days mod I  -Continue CIR 2.  Antithrombotics: -DVT/anticoagulation: SCDs             -antiplatelet therapy: Continue Aspirin 81 mg daily and Plavix 75 mg day x3 weeks and then Plavix alone 3. Pain Management: Continue Tylenol as needed  2/20- Continue tramadol 50 mg q6 hours prn for pain 4. Mood: Trazodone 50 mg nightly, melatonin 3 mg nightly             -antipsychotic agents: N/A 5. Neuropsych: This patient is capable of making decisions on his own behalf. 6. Skin/Wound Care: Routine skin checks. Loop recorder soiled- dressing changed 7. Fluids/Electrolytes/Nutrition: Routine in and outs with follow-up chemistries 8.  Dysphagia.  Dysphagia #1 honey thick liquids.  Follow-up speech therapy. Will check Cr/BUN in Am- if significantly elevated, might need night IVFs for now Monitor hydration  2/16- BUN and Cr NOT elevated so will con't to monitor 2x/week and add IVFs if cannot keep up his hydration with honey thick liquids.   2/17 MBS today to see if can upgrade diet/liquids  2/18- is now on nectar thick liquids- has been upgraded   2/21- water protocol- as well- BUN?CR not elevated. - con't regimen 9.  Hyperlipidemia.  Lipitor 10.  Hypothyroidism.  Continue Tapazole 11.  Tobacco abuse.  NicoDerm patch.  Provide counseling- smoked 2ppd- ok with Nicoderm  2/17- asked if could increase Patch- we cannot, but will add Gum if possible 12.  BPH.  Flomax 0.4 mg daily.   Check PVR 13.  GERD.  Protonix 14.  CAD with history of stenting.  Continue Plavix and aspirin 15. Thrush? Will try Diflucan 200 mg x 7 days. Might require nystatin swish and spit  2/16- says mouth feels a little better- con't Diflucan  2/17 - will add Nystatin swish and spit, since cannot  swallow it.   2/18- plaque is coming off tongue- 50% gone as of today  2/21- doing better- con't regimen 16. Constipation- asked for Colace  2/21- will give Sorbitol x1 for constipation- con't regimen 17. Anxiety/depression  2/16- common in new stroke patients- will have pt seen by Neuropsych and start Paxil 10 mg QHS for both Sx's.  2/17- says worried might be "something else"- feels it in epigastric area? Doesn't sounds like GERD  2/18- will change to Celexa per pt request.  18. Muscle spasms  2/17- Will add Baclofen 5 mg QID for spasticity/muscle spasms  2/18- muscle spasms 50-75% better  19. Xerostomia: lip balm ordered to help moisturize 20.Marland Kitchen Blurry vision  2/21- is B/L per pt- will see how it goes- not DM to cause blurry vision- con't monitor 21. Dispo  2/16- will d/c IV for now- bothering pt.   2/18- will give B12 shot- is due and Tuesday will do R shoulder injection    LOS: 6 days A FACE TO FACE EVALUATION WAS PERFORMED  Covey Baller 03/01/2020, 8:30 AM

## 2020-03-01 NOTE — Progress Notes (Signed)
Occupational Therapy Session Note  Patient Details  Name: David Hendricks MRN: [id number] Date of Birth: [birth year]  Today's Date: 03/01/2020 OT Individual Time: 9476-5465 OT Individual Time Calculation (min): 15 min  and Today's Date: 03/01/2020 OT Missed Time: 30 Minutes Missed Time Reason: Other (comment) (pt states he is "not having a good morning" with increased anxiety)   Short Term Goals: Week 1:  OT Short Term Goal 1 (Week 1): Pt will complete LB bathe/dress close Supervision OT Short Term Goal 2 (Week 1): Pt will perform functional mobility in prep for ADL with LRAD w/ CS OT Short Term Goal 3 (Week 1): Pt will perform 3/3 toileting tasks with CS OT Short Term Goal 4 (Week 1): Pt will exhibit good safety awareness during ADL with no more than occasional cues  Skilled Therapeutic Interventions/Progress Updates:    Pt resting in bed upon arrival. Pt states he is "not having a good morning" and notes increased anxiety. Pt states his vision is more blurry then previous days. MD aware. Pt sit EOB to open sugar and creamer containers. Therapist adds thickener. Pt comments that he is concerned about his vision and questions if he will be able to get full benefit from physical therapy. Reassured pt. Pt remained in bed.  Discussed taking a shower during afternoon session. Pt missed 30 mins skilled OT services.   Therapy Documentation Precautions:  Precautions Precautions: Fall Precaution Comments: Impulsive Restrictions Weight Bearing Restrictions: No General: General OT Amount of Missed Time: 30 Minutes    Pain:   Pt c/o Rt shoulder pain; MD scheduled to perform injection tomorrow   Therapy/Group: Individual Therapy  [name] 03/01/2020, 8:52 AM

## 2020-03-01 NOTE — Progress Notes (Signed)
Occupational Therapy Session Note  Patient Details  Name: David Hendricks MRN: [id number] Date of Birth: Jan 06, 1951  Today's Date: 03/01/2020 OT Individual Time: 1345-1425 OT Individual Time Calculation (min): 40 min    Short Term Goals: Week 1:  OT Short Term Goal 1 (Week 1): Pt will complete LB bathe/dress close Supervision OT Short Term Goal 2 (Week 1): Pt will perform functional mobility in prep for ADL with LRAD w/ CS OT Short Term Goal 3 (Week 1): Pt will perform 3/3 toileting tasks with CS OT Short Term Goal 4 (Week 1): Pt will exhibit good safety awareness during ADL with no more than occasional cues  Skilled Therapeutic Interventions/Progress Updates:    Pt in bed upon arrival with LPN present for bladder scan. Bladder scan results-753 ml. Pt amb with HHA to bathroom. Pt also constipated. Pt required max verbal cues to prevent straining/holding breath. Pt with small BM and small amount of urine. Pt completed hygiene with CGA. Pt not wearing clothes during session. Pt stood at sink to wash hands. Pt with increased anxiety that his progress is being deterred because of "all of these issure." Reassurance provided. Pt remained in bed with all needs within reach and bed alarm activated.   Therapy Documentation Precautions:  Precautions Precautions: Fall Precaution Comments: Impulsive Restrictions Weight Bearing Restrictions: No Pain:  Pt denies pain this afternoon   Therapy/Group: Individual Therapy  [name] 03/01/2020, 2:46 PM

## 2020-03-01 NOTE — Progress Notes (Signed)
Physical Therapy Session Note  Patient Details  Name: David Hendricks MRN: [id number] Date of Birth: 22-Oct-1950  Today's Date: 03/01/2020 PT Individual Time: 1000-1050 PT Individual Time Calculation (min): 50 min   Short Term Goals: Week 1:  PT Short Term Goal 1 (Week 1): Pt will complete least restrictive transfer at Supervision level PT Short Term Goal 2 (Week 1): Pt will ambulate x 150 ft with LRAD and Supervision PT Short Term Goal 3 (Week 1): Pt will complete objective balance measure to determine fall risk  Skilled Therapeutic Interventions/Progress Updates:    Pt received in bed and agreeable to therapy. Pt reports that his vision is more blurry than he has been experiencing but he is willing to participate. MD is aware of vision changes. Pt performed LB dressing in supine independently. Supine>EOB with supervision, independent for UB dressing. STS CGA throughout session. Gait CGA without AD 2x150 ft with no LOB. Pt reports tightness in his calves, reports some relief with stretching. Standing stretch on wedge, 3 x 30 sec with  BIL HHA. Able to progress to head turns and UUE support. Agility ladder x 6 patterns with CGA  to improve balance work on recalling sequencing. Patterns included forward, backward and side stepping. Pt demonstrated minimal LOB when stepping back but was able to recover. VCs for slower speed and occasional sequencing. Pt performed cone taps with CGA x 20. Side step with cone tap x 8 cones with CGA each direction. Pt states he needed to use the restroom, but returned to bed on returning to his room, stating "I don't have to go yet." Pt left with all needs in reach and bed alarm active. Pt missed 10 minutes due to refusal.  Therapy Documentation Precautions:  Precautions Precautions: Fall Precaution Comments: Impulsive Restrictions Weight Bearing Restrictions: No General: PT Amount of Missed Time (min): 10 Minutes PT Missed Treatment Reason: Patient unwilling to  participate     Therapy/Group: Individual Therapy  Sharen Counter, SPT 03/01/2020, 12:27 PM

## 2020-03-01 NOTE — Progress Notes (Signed)
Pt unable to have Bm for 4 days sorbitol given this am with no results PA notified. New orders received for fleets enema and bisacodyl suppository. Pt refused fleets enema but agreeable to Bisacodyl suppository. Suppository given with large type 6 bowel movement results. See flow sheets

## 2020-03-02 ENCOUNTER — Ambulatory Visit: Payer: Medicare Other

## 2020-03-02 LAB — URINALYSIS, ROUTINE W REFLEX MICROSCOPIC
Bilirubin Urine: NEGATIVE
Glucose, UA: NEGATIVE mg/dL
Hgb urine dipstick: NEGATIVE
Ketones, ur: NEGATIVE mg/dL
Leukocytes,Ua: NEGATIVE
Nitrite: NEGATIVE
Protein, ur: NEGATIVE mg/dL
Specific Gravity, Urine: 1.01 (ref 1.005–1.030)
pH: 6 (ref 5.0–8.0)

## 2020-03-02 MED ORDER — LIDOCAINE HCL (PF) 1 % IJ SOLN
5.0000 mL | Freq: Once | INTRAMUSCULAR | Status: AC
  Administered 2020-03-02: 5 mL
  Filled 2020-03-02: qty 5

## 2020-03-02 MED ORDER — TAMSULOSIN HCL 0.4 MG PO CAPS
0.4000 mg | ORAL_CAPSULE | Freq: Every day | ORAL | Status: DC
  Administered 2020-03-02 – 2020-03-05 (×4): 0.4 mg via ORAL
  Filled 2020-03-02 (×4): qty 1

## 2020-03-02 MED ORDER — TRIAMCINOLONE ACETONIDE 40 MG/ML IJ SUSP
40.0000 mg | Freq: Once | INTRAMUSCULAR | Status: AC
  Administered 2020-03-02: 40 mg via INTRA_ARTICULAR
  Filled 2020-03-02: qty 1

## 2020-03-02 MED ORDER — LIDOCAINE HCL (PF) 1 % IJ SOLN
2.0000 mL | Freq: Once | INTRAMUSCULAR | Status: DC
  Filled 2020-03-02: qty 2

## 2020-03-02 MED ORDER — HYDROCORTISONE 1 % EX CREA
TOPICAL_CREAM | Freq: Three times a day (TID) | CUTANEOUS | Status: DC
  Administered 2020-03-04 – 2020-03-05 (×2): 1 via TOPICAL
  Filled 2020-03-02 (×2): qty 28

## 2020-03-02 MED ORDER — LIDOCAINE HCL 1 % IJ SOLN
5.0000 mL | Freq: Once | INTRAMUSCULAR | Status: DC
  Filled 2020-03-02: qty 5

## 2020-03-02 MED ORDER — LIDOCAINE HCL URETHRAL/MUCOSAL 2 % EX GEL: 1.0000 "application " | CUTANEOUS | Status: DC | PRN

## 2020-03-02 NOTE — Progress Notes (Signed)
Speech Language Pathology Daily Session Note  Patient Details  Name: Kelli Egolf MRN: [id number] Date of Birth: 07-14-1950  Today's Date: 03/02/2020 SLP Individual Time: 1302-1330, and 902-930 SLP Individual Time Calculation (min): 28 min and 28 min   Short Term Goals: Week 1: SLP Short Term Goal 1 (Week 1): Patient will perform swallow safety precautions with supervision A for accuracy and consistency. SLP Short Term Goal 2 (Week 1): Patient will tolerate Dys 2 solids, nectar thick liquids with minA. SLP Short Term Goal 3 (Week 1): Patient will particpiate in higher level problem solving tasks (medication management, financial management, etc) with minA. SLP Short Term Goal 4 (Week 1): Patient will demonstrate adequate anticipatory awareness during ADL"s and when discussing discharge planning with minA.  Skilled Therapeutic Interventions: #1 Skilled ST services focused on swallow skills. Pt reported continued acute deficits in double vision. Pt required supervision A verbal cues to recall swallow strategies, throat clear and re-swallow. Pt preformed oral care mod I. Pt consumed 3oz thin liquid via cup, mod I for use of small sips, swallow appeared timely and vocal production was dry. No overt s/s aspiration. SLP recommends repeat MBS soon, after chart review of vitals signs being [facility] during course of water protocol. Pt did express likely baseline deficits of pharyngeal residue, "sometimes I was spitting back up my food. It wasn't acidic like reflux, but when I ate hamburger helper I would find it would come back up." Pt is also edentulous and expressed primarily consuming dys 2- dys 3 textures. At the end of the session SLP thicken coffee to nectar thick liquid and provided education how to thicken liquids. Pt was left in room with call bell within reach and bed alarm set. SLP recommends to continue skilled services.  #2 Skilled ST services focused on swallow skills. SLP facilitated PO consumption  of dys 2 texture trial and nectar thick liquid via cup snack. Pt initially did not demonstrate use of swallow strategies, however with x1 reminder demonstrated mod I. Pt was able to clear oral cavity and reports no sensation of pharyngeal residue. SLP reviewed last weeks' MBS with pt to identify exercises and noted reduced anterior laryngeal movement impacting pharyngeal residue. SLP taught CTAR, pt returned demonstration completing x10. SLP plans for repeat MBS on thursday 03/04/20.  Pt was left in room with call bell within reach and bed alarm set. SLP recommends to continue skilled services.     Pain Pain Assessment Pain Score: 0-No pain  Therapy/Group: Individual Therapy  Claudell Rhody  [facility] 03/02/2020, 5:03 PM

## 2020-03-02 NOTE — Progress Notes (Signed)
Occupational Therapy Session Note  Patient Details  Name: David Hendricks MRN: [id number] Date of Birth: 12-12-1950  Today's Date: 03/02/2020 OT Individual Time: 1130-1155 OT Individual Time Calculation (min): 25 min    Short Term Goals: Week 2:  OT Short Term Goal 1 (Week 2): STG=LTG 2/2 ELOS  Skilled Therapeutic Interventions/Progress Updates:    Pt resting in recliner upon arrival with RN present. Pt reports considerable pain per below. OT intervention with focus on discharge planning and recommendations regarding IALDs. Pt agreed that grocery delivery is best option. Pt does not do his laundry but had been taking to "a lady" who washed his laundry. Pt states he has a friend who can take his clothes to her. Pt primarily prepares microwave meals in addition to coffee. Will address in future session. Pt reamined in relciner with all needs within reach and seat alarm activated.  Therapy Documentation Precautions:  Precautions Precautions: Fall Precaution Comments: Impulsive Restrictions Weight Bearing Restrictions: No  Pain: Pt reports considerable pain following RN completing I/O; rest (per request)  Therapy/Group: Individual Therapy  [name] 03/02/2020, 12:14 PM

## 2020-03-02 NOTE — Progress Notes (Signed)
Physical Therapy Weekly Progress Note  Patient Details  Name: David Hendricks MRN: [id number] Date of Birth: April 01, 1950  Beginning of progress report period: February 25, 2020 End of progress report period: March 03, 2020   Patient has met 1 of 3 short term goals.  Pt is still overall at CGA level for transfers and gait due to ongoing impulsivity and decreased safety awareness regarding balance deficits. Pt is able to ambulate 300 (+) ft with no AD and CGA and perform stairs with 2 handrails and CGA for safety. Pt exhibits increase in gait speed with onset of fatigue due to anxiety regarding finding a place to sit, education has been ongoing regarding safety awareness. Pt lives alone and will have limited support upon d/c home and will need to be able to perform functional mobility at mod I level for a safe d/c home.  Patient continues to demonstrate the following deficits decreased cardiorespiratoy endurance, decreased visual acuity, decreased attention, decreased awareness, decreased problem solving, decreased safety awareness and decreased memory and decreased standing balance, decreased postural control and decreased balance strategies and therefore will continue to benefit from skilled PT intervention to increase functional independence with mobility.  Patient progressing toward long term goals..  Continue plan of care.  PT Short Term Goals Week 1:  PT Short Term Goal 1 (Week 1): Pt will complete least restrictive transfer at Supervision level PT Short Term Goal 1 - Progress (Week 1): Progressing toward goal PT Short Term Goal 2 (Week 1): Pt will ambulate x 150 ft with LRAD and Supervision PT Short Term Goal 2 - Progress (Week 1): Progressing toward goal PT Short Term Goal 3 (Week 1): Pt will complete objective balance measure to determine fall risk PT Short Term Goal 3 - Progress (Week 1): Met Week 2:  PT Short Term Goal 1 (Week 2): =LTG due to ELOS  Therapy Documentation Precautions:   Precautions Precautions: Fall Precaution Comments: Impulsive Restrictions Weight Bearing Restrictions: No   Therapy/Group: Individual Therapy   Excell Seltzer, PT, DPT 03/02/2020, 6:01 PM

## 2020-03-02 NOTE — Progress Notes (Signed)
Patient ID: David Hendricks, male   DOB: 1950-10-06, 70 y.o.   MRN: [id number]  SW met with pt in room to provide updates from team conference, and d/c date 3/1; pt sister [name] [phone]) was called while in room. SW discussed with pt transportation home, and how he will need to call DSS to arrange Medicaid transportation for appointments. SW to follow up with pt and pt sister once there are more updates on d/c recommendations.   Loralee Pacas, MSW, [place] Office: [phone] Cell: [phone] Fax: [phone]

## 2020-03-02 NOTE — Patient Care Conference (Signed)
Inpatient RehabilitationTeam Conference and Plan of Care Update Date: 03/02/2020   Time: 11:01 AM    Patient Name: David Hendricks      Medical Record Number: [id number]  Date of Birth: 1950-08-31 Sex: Male         Room/Bed: 4W05C/4W05C-01 Payor Info: Payor: MEDICARE / Plan: MEDICARE PART A AND B / Product Type: *No Product type* /    Admit Date/Time:  02/24/2020  2:32 PM  Primary Diagnosis:  Right middle cerebral artery stroke [facility])  Hospital Problems: Principal Problem:   Right middle cerebral artery stroke [facility])    Expected Discharge Date: Expected Discharge Date: 03/09/20  Team Members Present: Physician leading conference: Dr. Courtney Heys Care Coodinator Present: Loralee Pacas, LCSWA;Marnette Perkins Creig Hines, RN, BSN, CRRN Nurse Present: Annita Brod, LPN PT Present: Excell Seltzer, PT OT Present: Roanna Epley, COTA;Jennifer Tamala Julian, OT PPS Coordinator present : Gunnar Fusi, SLP     Current Status/Progress Goal Weekly Team Focus  Bowel/Bladder   Patient is continent of bowel and bladder. In and out cath performed x1 on 2/21 for inability to void.  Patient to remain continent of both bowel and bladder.  Monitor for bladder and bowel changes.   Swallow/Nutrition/ Hydration             ADL's   supervision/CGA for BADLs, CGA for amb with CGA no AD; mod/max verbal cues for safety awareness  mod I overall  BADLs retraining, safety awareness, functional transfers   Mobility   Independent bed mobility, CGA transfers, gait up to 200 ft CGA, CGA stairs  mod I overall  safety awareness, balance, endurance   Communication             Safety/Cognition/ Behavioral Observations            Pain   Complaint of pain to left ear and neck area 5/10.  Pain of 0.  Assess for pain and medicate prn.   Skin   Skin is dry and intact. Dressing to left chest in place from loop recorder.  No skin breakdown.  Monitor skin q shift and prn.     Discharge Planning:  Pt to d/c to home alone with  PRN/intermittent support from a friend to help with appointments.   Team Discussion: Thought memory was doing well but not sure. Possible plan to inject steroids into right shoulder. BUN and Creatinine levels okay. MD request nurse to brush tongue every day, hydrocortisone cream for rash on back. Continent B/B, no c/o pain. Discharge home alone, has "friends" that will help, but otherwise will not have a lot of help.  Patient on target to meet rehab goals: Contact guard assist for gait without RW. Mod I goals. Supervision to contact guard for ADL's. Mod I goals. Not going to be a safe Mod I at discharge.  *See Care Plan and progress notes for long and short-term goals.   Revisions to Treatment Plan:  Started Flomax at HS.  Teaching Needs: Family education, medication management, skin/wound care, safety awareness, transfer training, gait training, endurance training.  Current Barriers to Discharge: Inaccessible home environment, Decreased caregiver support, Home enviroment access/layout, Lack of/limited family support, Medication compliance, Behavior and Nutritional means  Possible Resolutions to Barriers: Continue current medications, offer nutritional support, provide emotional support to patient.     Medical Summary Current Status: R shoulder pain; urinary retention- cathed x2 o/n- rash on back; also c/o blurry vision which is new.  Barriers to Discharge: Other (comments);Decreased family/caregiver support;Home enviroment access/layout;Behavior;Medical stability;Medication compliance  Barriers to Discharge Comments: Urinary retention- going home on own- "friends to help"; asked for R shoulder steroid injection Possible Resolutions to [employer] Focus: R shoulder steroid injections, started Flomax QHS; checking U/A and Cx for possible UTI; cortiosone cream for rash on back; poor safety awareness- mod I goals; will see if Ophtho will see pt for blurry vision  d/c date-3/1   Continued  Need for Acute Rehabilitation Level of Care: The patient requires daily medical management by a physician with specialized training in physical medicine and rehabilitation for the following reasons: Direction of a multidisciplinary physical rehabilitation program to maximize functional independence : Yes Medical management of patient stability for increased activity during participation in an intensive rehabilitation regime.: Yes Analysis of laboratory values and/or radiology reports with any subsequent need for medication adjustment and/or medical intervention. : Yes   I attest that I was present, lead the team conference, and concur with the assessment and plan of the team.   Cristi Loron 03/02/2020, 5:32 PM

## 2020-03-02 NOTE — Progress Notes (Signed)
Occupational Therapy Weekly Progress Note  Patient Details  Name: David Hendricks MRN: [id number] Date of Birth: Mar 09, 1950  Beginning of progress report period: February 25, 2020 End of progress report period: March 02, 2020  Patient has met 4 of 4 short term goals.  Pt is making good progress with BADLs, functional tranfsers, and functional amb with RW and without RW. Pt completes bathing/dressing tasks with supervision/CGA and min verbal cues for safety awareness. Pt amb in room without AD and occasionally "furniture walking". Amb in hallway without AD and CGA. Pt with increased anxiety and requires verbal cues for relaxation strategies. Pt lives alone and will need to be mod I at discharge.   Patient continues to demonstrate the following deficits: muscle weakness, decreased cardiorespiratoy endurance, impaired timing and sequencing, abnormal tone, unbalanced muscle activation, decreased coordination and decreased motor planning, decreased attention, decreased awareness, decreased problem solving and decreased safety awareness and decreased standing balance, decreased postural control and decreased balance strategies and therefore will continue to benefit from skilled OT intervention to enhance overall performance with BADL, iADL and Reduce care partner burden.  Patient progressing toward long term goals..  Continue plan of care.  OT Short Term Goals Week 1:  OT Short Term Goal 1 (Week 1): Pt will complete LB bathe/dress close Supervision OT Short Term Goal 1 - Progress (Week 1): Met OT Short Term Goal 2 (Week 1): Pt will perform functional mobility in prep for ADL with LRAD w/ CS OT Short Term Goal 2 - Progress (Week 1): Met OT Short Term Goal 3 (Week 1): Pt will perform 3/3 toileting tasks with CS OT Short Term Goal 3 - Progress (Week 1): Met OT Short Term Goal 4 (Week 1): Pt will exhibit good safety awareness during ADL with no more than occasional cues OT Short Term Goal 4 - Progress  (Week 1): Met Week 2:  OT Short Term Goal 1 (Week 2): STG=LTG 2/2 ELOS   [name] 03/02/2020, 6:37 AM

## 2020-03-02 NOTE — Progress Notes (Signed)
MD Lovorn discussed procedure benefits/risks. Pt in agreement with questions answered. Consent signed for right shoulder injection for arthritic joint. Pt tolerated injection well. No complications noted. Sheela Stack, LPN

## 2020-03-02 NOTE — Progress Notes (Signed)
Physical Therapy Session Note  Patient Details  Name: David Hendricks MRN: [id number] Date of Birth: 31-Aug-1950  Today's Date: 03/02/2020 PT Individual Time: 1000-1025 PT Individual Time Calculation (min): 25 min   Short Term Goals: Week 1:  PT Short Term Goal 1 (Week 1): Pt will complete least restrictive transfer at Supervision level PT Short Term Goal 2 (Week 1): Pt will ambulate x 150 ft with LRAD and Supervision PT Short Term Goal 3 (Week 1): Pt will complete objective balance measure to determine fall risk  Skilled Therapeutic Interventions/Progress Updates:    Pt received supine in bed, undressed but agreeable to therapy. No reports of pain but reports frustration regarding recent catheterizations. Attempted to explain but patient with limited receptiveness. Required redirection for engaging in OOB mobility. Donned pants and socks with setupA while supine in bed, able to complete upper body dressing with setupA while seated EOB.  Supine<>sit with minA with assist for trunk elevation. Stand<>pivot with CGA from EOB to w/c and wheeled to day room gym for time management.   Gait training with obstacle course and no AD, requiring minA for steadying as he stepped over x5 low height obstacles (successful ~95% of the time). He then completed the same obstacle course but this time he was instructed on lateral stepping L<>R over obstacles, requiring a heavier minA for balance. Gait x156ft with no AD and minA with suitcase carry of 7lb dumbbell in RUE and dual-cog task overlay with serial casting by 2's from 100. He required mod cues for correct number sequencing.   Pt then returned to his room with totalA in w/c, encouraged him to remain OOB to chair at end of session to improve toileting. Ambulated in room distances with minA and no AD and ended session seated in recliner with chair alarm on, needs within reach, made comfortable.  Therapy Documentation Precautions:  Precautions Precautions:  Fall Precaution Comments: Impulsive Restrictions Weight Bearing Restrictions: No General:    Therapy/Group: Individual Therapy  Osborne Serio P Glora Hulgan PT 03/02/2020, 7:40 AM

## 2020-03-02 NOTE — Progress Notes (Signed)
Physical Therapy Session Note  Patient Details  Name: David Hendricks MRN: [id number] Date of Birth: 21-Feb-1950  Today's Date: 03/02/2020 PT Individual Time: 8295-6213 PT Individual Time Calculation (min): 60 min   Short Term Goals: Week 1:  PT Short Term Goal 1 (Week 1): Pt will complete least restrictive transfer at Supervision level PT Short Term Goal 2 (Week 1): Pt will ambulate x 150 ft with LRAD and Supervision PT Short Term Goal 3 (Week 1): Pt will complete objective balance measure to determine fall risk  Skilled Therapeutic Interventions/Progress Updates:    Pt reclined in bed on arrival. States that he heard today that his aunt has passed away, but is willing to do therapy. Pt was visibly distraught and stated he was "distracted" throughout session by this news. Pt dressed independently and stood CGA throughout session without AD. Gait x 250 ft without AD with CGA to therapy gym. Pt performed 3 x 4 stairs (6") with CGA. Discussed pt's anxiety about returning home and how he will be getting groceries and prescriptions, as well as encouraging pt to reach out when he is feeling strong emotions. Gait x 500+ ft with CGA and no AD hospital entrance. Pt demonstrated extreme impulsivity and LOB once outside the unit, including anterior lean without recovery. Pt needed min A x 2 to safely sit in wc. Pt was transported in wc back to room and returned to bed. Pt was left with all needs in reach, bed alarm active, and nursing present.   Therapy Documentation Precautions:  Precautions Precautions: Fall Precaution Comments: Impulsive Restrictions Weight Bearing Restrictions: No    Therapy/Group: Individual Therapy  Sharen Counter, SPT 03/02/2020, 6:00 PM

## 2020-03-02 NOTE — Progress Notes (Signed)
Occupational Therapy Session Note  Patient Details  Name: Eldredge Veldhuizen MRN: [id number] Date of Birth: 1950-10-17  Today's Date: 03/02/2020 OT Individual Time: 1345-1430 OT Individual Time Calculation (min): 45 min    Short Term Goals: Week 2:  OT Short Term Goal 1 (Week 2): STG=LTG 2/2 ELOS  Skilled Therapeutic Interventions/Progress Updates:    Pt resting in bed upon arrival and agreeable to getting OOB and taking shower. Pt donned nonslip socks and amb without AD to bathroom-CGA. Pt completed shower at supervision level. Pt returned to room and sat in w/c to complete shaving. Pt continues to c/o blurry vision which has not resolved. Pt returned to bed. Pt did not don clothes during session. Pt remained in bed with all needs within reach and bed alarm activated.   Therapy Documentation Precautions:  Precautions Precautions: Fall Precaution Comments: Impulsive Restrictions Weight Bearing Restrictions: No  Pain:     Therapy/Group: Individual Therapy  [name] 03/02/2020, 2:50 PM

## 2020-03-02 NOTE — Progress Notes (Addendum)
PROGRESS NOTE   Subjective/Complaints:  LBM was yesterday.  Required to be cathed x2 overnight- pt very upset about this.  Also has itchy back and lips are dry.    ROS:  Pt denies SOB, abd pain, CP, N/V/C/D, and vision changes    Objective:   No results found. Recent Labs    03/01/20 0511  WBC 8.6  HGB 16.5  HCT 48.2  PLT 153   Recent Labs    03/01/20 0511  NA 138  K 3.7  CL 98  CO2 32  GLUCOSE 87  BUN 12  CREATININE 1.02  CALCIUM 9.1    Intake/Output Summary (Last 24 hours) at 03/02/2020 0910 Last data filed at 03/02/2020 2637 Gross per 24 hour  Intake 417 ml  Output 2025 ml  Net -1608 ml        Physical Exam: Vital Signs Blood pressure 113/74, pulse 71, temperature (!) 97.5 F (36.4 C), temperature source Oral, resp. rate 15, height 6\' 1"  (1.854 m), weight 92.9 kg, SpO2 92 %.  Gen: laying in bed- unhappy about caths, NAD HEENT: conjugate gaze, per pt, vision still blurry - tongue looks a little better, but still covered in white plaque in middle.  Cardio: RRR- no JVD Chest: CTA B/L- no W/R/R- good air movement Abd: Soft, NT, ND, (+)BS  Ext: no edema Psych:appropriate- unhappy about caths Skin: intact but has patchy red rash on L upper back Musculoskeletal:     Cervical back: Normal range of motion and neck supple. 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  Light touch intact x 4 extremities       Assessment/Plan: 1. Functional deficits which require 3+ hours per day of  interdisciplinary therapy in a comprehensive inpatient rehab setting.  Physiatrist is providing close team supervision and 24 hour management of active medical problems listed below.  Physiatrist and rehab team continue to assess barriers to discharge/monitor patient progress toward functional and medical goals  Care Tool:  Bathing    Body parts bathed by patient: Right arm,Left arm,Chest,Abdomen,Face (declined bathing LB)         Bathing assist Assist Level: Supervision/Verbal cueing     Upper Body Dressing/Undressing Upper body dressing   What is the patient wearing?: Pull over shirt    Upper body assist Assist Level: Set up assist    Lower Body Dressing/Undressing Lower body dressing      What is the patient wearing?: Pants     Lower body assist Assist for lower body dressing: Contact Guard/Touching assist     Toileting Toileting    Toileting assist Assist for toileting: Contact Guard/Touching assist     Transfers Chair/bed transfer  Transfers assist     Chair/bed transfer assist level: Contact Guard/Touching assist  Locomotion Ambulation   Ambulation assist      Assist level: Contact Guard/Touching assist Assistive device: No Device Max distance: 150   Walk 10 feet activity   Assist     Assist level: Contact Guard/Touching assist Assistive device: No Device   Walk 50 feet activity   Assist    Assist level: Contact Guard/Touching assist Assistive device: No Device    Walk 150 feet activity   Assist    Assist level: Contact Guard/Touching assist Assistive device: No Device    Walk 10 feet on uneven surface  activity   Assist     Assist level: Contact Guard/Touching assist Assistive device: Walker-rolling   Wheelchair     Assist Will patient use wheelchair at discharge?: No             Wheelchair 50 feet with 2 turns activity    Assist            Wheelchair 150 feet activity     Assist           Blood pressure 113/74, pulse 71, temperature (!) 97.5 F (36.4 C), temperature source Oral, resp. rate 15, height 6\' 1"  (1.854 m), weight 92.9 kg, SpO2 92 %.  Medical Problem List and Plan: 1.  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  Planning loop recorder.             -patient may  Shower after covered loop recorder- pt really wants hair washed  2/16- first day of therapies- pt ready.  2/18- walking with OT/RW- doing great             -ELOS/Goals: 16-18 days mod I  -Continue CIR 2.  Antithrombotics: -DVT/anticoagulation: SCDs             -antiplatelet therapy: Continue Aspirin 81 mg daily and Plavix 75 mg day x3 weeks and then Plavix alone 3. Pain Management: Continue Tylenol as needed  2/20- Continue tramadol 50 mg q6 hours prn for pain 4. Mood: Trazodone 50 mg nightly, melatonin 3 mg nightly             -antipsychotic agents: N/A 5. Neuropsych: This patient is capable of making decisions on his own behalf. 6. Skin/Wound Care: Routine skin checks. Loop recorder soiled- dressing changed 7. Fluids/Electrolytes/Nutrition: Routine in and outs with follow-up chemistries 8.  Dysphagia.  Dysphagia #1 honey thick liquids.  Follow-up speech therapy. Will check Cr/BUN in Am- if significantly elevated, might need night IVFs for now Monitor hydration  2/16- BUN and Cr NOT elevated so will con't to monitor 2x/week and add IVFs if cannot keep up his hydration with honey thick liquids.   2/17 MBS today to see if can upgrade diet/liquids  2/18- is now on nectar thick liquids- has been upgraded   2/21- water protocol- as well- BUN?CR not elevated. - con't regimen 9.  Hyperlipidemia.  Lipitor 10.  Hypothyroidism.  Continue Tapazole 11.  Tobacco abuse.  NicoDerm patch.  Provide counseling- smoked 2ppd- ok with Nicoderm  2/17- asked if could increase Patch- we cannot, but will add Gum if possible 12.  BPH.  Flomax 0.4 mg daily.  Check  PVR 13.  GERD.  Protonix 14.  CAD with history of stenting.  Continue Plavix and aspirin 15. Thrush? Will try Diflucan 200 mg x 7 days. Might require nystatin swish and spit  2/16- says mouth feels a little better- con't Diflucan  2/17 - will add Nystatin swish and spit,  since cannot swallow it.   2/18- plaque is coming off tongue- 50% gone as of today  2/21- doing better- con't regimen  2/22- Plaque isn't reducing in size anymore- but mouth/tongue less erythematous- con't regimen 16. Constipation- asked for Colace  2/21- will give Sorbitol x1 for constipation- con't regimen  2/22- LBM yesterday- con't regimen 17. Anxiety/depression  2/16- common in new stroke patients- will have pt seen by Neuropsych and start Paxil 10 mg QHS for both Sx's.  2/17- says worried might be "something else"- feels it in epigastric area? Doesn't sounds like GERD  2/18- will change to Celexa per pt request.  18. Muscle spasms  2/17- Will add Baclofen 5 mg QID for spasticity/muscle spasms  2/18- muscle spasms 50-75% better  19. Xerostomia: lip balm ordered to help moisturize 20.Marland Kitchen Blurry vision  2/21- is B/L per pt- will see how it goes- not DM to cause blurry vision- con't monitor 21/ R shoulder chronic pain  2/22- R shoulder injection today 22. Urinary retention  2/22- will start Flomax, but also check U/A and Cx to make sure not brewing UTI.  23. Rash on back  2/22- will start Cortisone cream TID.  24. Dispo  2/16- will d/c IV for now- bothering pt.   2/18- will give B12 shot- is due and Tuesday will do R shoulder injection    Went over risks/benefits of R shoulder steroid injection- consent signed.  steroid injection was performed at R posterior RTC shoulder injection using 1% plain Lidocaine and 40mg  /1cc of Kenalog. This was well tolerated.  Cleaned with betadine x3 and allowed to dry- then alcohol then injected using 27 gauge 1.5 inch needle- no bleeding or complications.    F/U in 3 months for  steroid injections of shoulder on R.   Lidocaine will kick in 15 minutes- and wear off tonight- the steroid will kick in tomorrow within 24 hours and take up to 72 hours to fully kick in.   I spent a total of 35 minutes on care today- >50% coordination of care.    LOS: 7 days A FACE TO FACE EVALUATION WAS PERFORMED  Myrtis Maille 03/02/2020, 9:10 AM

## 2020-03-03 LAB — URINE CULTURE: Culture: NO GROWTH

## 2020-03-03 NOTE — Progress Notes (Signed)
Occupational Therapy Session Note  Patient Details  Name: David Hendricks MRN: [id number] Date of Birth: 1950/05/23  Today's Date: 03/03/2020 OT Individual Time: 8833-7445 OT Individual Time Calculation (min): 41 min    Short Term Goals: Week 1:  OT Short Term Goal 1 (Week 1): Pt will complete LB bathe/dress close Supervision OT Short Term Goal 1 - Progress (Week 1): Met OT Short Term Goal 2 (Week 1): Pt will perform functional mobility in prep for ADL with LRAD w/ CS OT Short Term Goal 2 - Progress (Week 1): Met OT Short Term Goal 3 (Week 1): Pt will perform 3/3 toileting tasks with CS OT Short Term Goal 3 - Progress (Week 1): Met OT Short Term Goal 4 (Week 1): Pt will exhibit good safety awareness during ADL with no more than occasional cues OT Short Term Goal 4 - Progress (Week 1): Met Week 2:  OT Short Term Goal 1 (Week 2): STG=LTG 2/2 ELOS   Skilled Therapeutic Interventions/Progress Updates:    Pt greeted at time of session supine in bed semireclined agreeable to OT session, declined bathing as he just showered yesterday. Wanting to put on clothes, set up for UB and pt preferring to don pants bed level despite education on performing as he will at home, performed with set up/supervision bed level with bridge. Supine > sit Supervision and sit > stand CGA/close supervision to mix coffee with sugar/creams in standing with close supervision. Extended amount of time locating thickener as there was none in room, RN communicated she will be ordering more. After speaking with SLP, located nectar thick packet and thickened coffee to appropriate consistency. Pt also performed 1x10 squats EOB, encouragement for knee/hip flexion. Stand pivot bed > recliner CGA with no AD, alarm on call bell in reach.   Therapy Documentation Precautions:  Precautions Precautions: Fall Precaution Comments: Impulsive Restrictions Weight Bearing Restrictions: No    Therapy/Group: Individual Therapy  Viona Gilmore 03/03/2020, 9:04 AM

## 2020-03-03 NOTE — Progress Notes (Signed)
Patient ID: David Hendricks, male   DOB: [birth year], 70 y.o.   MRN: [id number]  SW left returned call/left message for pt sister [name] who wanted to know more about pt diet. SW informed pt currently on D2 texture trial and nectar thick liquids via cup snack, with a repeat MBS on 2/24 and we will follow-up with more updates then. Encouraged to follow-up if more questions.   Loralee Pacas, MSW, [place] Office: [phone] Cell: [phone] Fax: [phone]

## 2020-03-03 NOTE — Progress Notes (Signed)
Speech Language Pathology Weekly Progress and Session Note  Patient Details  Name: David Hendricks MRN: [id number] Date of Birth: 07-16-1950  Beginning of progress report period: February 26, 2020 End of progress report period: March 03, 2020  Today's Date: 03/03/2020 SLP Individual Time: 1302-1400 SLP Individual Time Calculation (min): 58 min  Short Term Goals: Week 1: SLP Short Term Goal 1 (Week 1): Patient will perform swallow safety precautions with supervision A for accuracy and consistency. SLP Short Term Goal 1 - Progress (Week 1): Met SLP Short Term Goal 2 (Week 1): Patient will tolerate Dys 2 solids, nectar thick liquids with minA. SLP Short Term Goal 2 - Progress (Week 1): Met SLP Short Term Goal 3 (Week 1): Patient will particpiate in higher level problem solving tasks (medication management, financial management, etc) with minA. SLP Short Term Goal 3 - Progress (Week 1): Met SLP Short Term Goal 4 (Week 1): Patient will demonstrate adequate anticipatory awareness during ADL"s and when discussing discharge planning with minA. SLP Short Term Goal 4 - Progress (Week 1): Met    New Short Term Goals: Week 2: SLP Short Term Goal 1 (Week 2): STG=LTG due to short ELOS (planned d/c 3/1)  Weekly Progress Updates: Pt made great progress meeting 4 out 4 goals. Pt demonstrates carryover of swallow strategies with supervision A verbal cues, tolerance of water protocol with planned MBS for tomorrow. SP initiated swallow exercises to increase anterior laryngeal movement to reduce pharyngeal residue. Pt completed medication management tasks with min-supervision A verbal cues and anticipatory awareness with supervision A verbal cues. Pt would continue to benefit from skilled ST services in order to maximize functional independence and reduce burden of care.      Intensity: Minumum of 1-2 x/day, 30 to 90 minutes Frequency: 3 to 5 out of 7 days Duration/Length of Stay:  3/1 Treatment/Interventions: Cognitive remediation/compensation;Dysphagia/aspiration precaution training;Medication managment;Functional tasks;Patient/family education   Daily Session  Skilled Therapeutic Interventions: Skilled ST services focused cognitive skills. Pt expressed globus sensation upon ST entering room. When asked to recall swallow strategies pt added chin tuck strategies which was only taught as an exercise, but recalled throat clear and re-swallow mod I. Pt preformed x10 CTAR mod I. Pt preformed throat clear and re-swallow reporting no further globus sensation. Pt demonstrated good recall of starting medication management with pill organizer but not completing due to acute double vision. Pt supports slight improvement in double vision and demonstrated ability to read medication with supervision A verbal cues.   SLP facilitated complex problem solving with medication management utilizing QID pill organizer, pt required mod A fade to min A verbal cues for error awareness (ilkely impacted by double vision) and problem solving. Pt expressed desire to consume pills from the bottle versus pill organizer due to limited ability to check for error even after SLP educated pt on several techniques. SLP and organized list of medication based on times per day, pt required supervision A verbal cues for organization. SLp facilitated anticipatory awareness list activities that will required assistance and need for planning for friend support, with supervision A verbal cues. SLP is mildly concern about safe living alone at discharge but believe pt is approaching baseline cognition. Pt was left in room with call bell within reach and chair alarm set. SLP recommends to continue skilled services.    General    Pain Pain Assessment Pain Score: 0-No pain  Therapy/Group: Individual Therapy  Shamika Pedregon  [facility] 03/03/2020, 4:04 PM

## 2020-03-03 NOTE — Progress Notes (Signed)
Physical Therapy Session Note  Patient Details  Name: David Hendricks MRN: [id number] Date of Birth: 1950-03-08  Today's Date: 03/03/2020 PT Individual Time: 0800-0900 PT Individual Time Calculation (min): 60 min   Short Term Goals: Week 1:  PT Short Term Goal 1 (Week 1): Pt will complete least restrictive transfer at Supervision level PT Short Term Goal 2 (Week 1): Pt will ambulate x 150 ft with LRAD and Supervision PT Short Term Goal 3 (Week 1): Pt will complete objective balance measure to determine fall risk  Skilled Therapeutic Interventions/Progress Updates:    Pt supine in bed on arrival and agreeable to therapy. Pt donned clothes with set up A, pants in supine and shirt and shoes sitting EOB. Gait x 150 ft to day room with CGA and no AD. Obstacle course x 4 laps with CGA, featuring side stepping, airex, incline, tandem walking, cone taps, 4" step, and low step overs. Pt tolerated well without LOB. Pt engaged in corn hole x 4 while standing on incline with CGA. Pt demonstrated LOB x2 and required min A to recover. Gait  2 x 350 ft with CGA and no AD. Pt demonstrated impulsive behavior and increased gait speed when he became fatigued. Pt reported that he felt anxious and needed to sit right away. Discussed taking standing breaks when he became fatigued so he did not need to rush to sit. Pt expressed understanding but repeated the same behavior when returning to his room. Pt returned to bed and was left with all needs in reach and bed alarm active.   Therapy Documentation Precautions:  Precautions Precautions: Fall Precaution Comments: Impulsive Restrictions Weight Bearing Restrictions: No    Therapy/Group: Individual Therapy  Sharen Counter, SPT 03/03/2020, 12:05 PM

## 2020-03-03 NOTE — Progress Notes (Signed)
PROGRESS NOTE   Subjective/Complaints:  Pt reports no improvement in blurry vision.   R shoulder somewhat better after injections.  Starting peeing well this AM- U/A (-) for UTI.  Has cramping in calves- is chronic- had for "awhile", esp before stroke.   Took something for pain o/n for cramping in calves.    ROS:  Pt denies SOB, abd pain, CP, N/V/C/D, and vision changes    Objective:   No results found. Recent Labs    03/01/20 0511  WBC 8.6  HGB 16.5  HCT 48.2  PLT 153   Recent Labs    03/01/20 0511  NA 138  K 3.7  CL 98  CO2 32  GLUCOSE 87  BUN 12  CREATININE 1.02  CALCIUM 9.1    Intake/Output Summary (Last 24 hours) at 03/03/2020 0835 Last data filed at 03/03/2020 0317 Gross per 24 hour  Intake 60 ml  Output 1800 ml  Net -1740 ml        Physical Exam: Vital Signs Blood pressure 128/65, pulse 69, temperature 98.2 F (36.8 C), resp. rate 18, height 6\' 1"  (1.854 m), weight 92.9 kg, SpO2 90 %.  YIA:XKPVVZ in bed; PT joined [place]; appropriate, NAD HEENT: conjugate gaze; c/o blurry vision Cardio: RRR- no JVD Chest: CTA B/L- no W/R/R- good air movement Abd: Soft, NT, ND, (+)BS   Ext: no edema Psych:brighter affect Skin: intact but has patchy red rash on L upper back- no significant change Musculoskeletal:     Cervical back: Normal range of motion and neck supple. TTP over R RTC/shoulder C/O cramping in calves- no TTP over calves- in gastroc/soleus- a little tight    Comments: RUE 5/5 in biceps, triceps, WE, grip and finger abd LUE- 5-/5 except finger abd 4+/5 RLE- 5/5 in JF, KE, DF and PF LLE- 5-/5 in same muscles Skin:loop recorder area really bruised/purple- R forearm less erythema in 1 spot- but c/o itching  Neurological: Ox3- Hoffman's on LUE No increased tone in B/L LEs- no clonus   Speech is mildly dysarthric but intelligible- mildly better today- can understand better Ox3- blurry  vision, "even with eyeglasses".  Light touch intact x 4 extremities       Assessment/Plan: 1. Functional deficits which require 3+ hours per day of interdisciplinary therapy in a comprehensive inpatient rehab setting.  Physiatrist is providing close team supervision and 24 hour management of active medical problems listed below.  Physiatrist and rehab team continue to assess barriers to discharge/monitor patient progress toward functional and medical goals  Care Tool:  Bathing    Body parts bathed by patient: Right arm,Left arm,Chest,Abdomen,Front perineal area,Buttocks,Right upper leg,Left upper leg,Right lower leg,Left lower leg,Face         Bathing assist Assist Level: Supervision/Verbal cueing     Upper Body Dressing/Undressing Upper body dressing   What is the patient wearing?: Pull over shirt    Upper body assist Assist Level: Set up assist    Lower Body Dressing/Undressing Lower body dressing      What is the patient wearing?: Pants     Lower body assist Assist for lower body dressing: Contact Guard/Touching assist     Toileting Toileting  Toileting assist Assist for toileting: Contact Guard/Touching assist     Transfers Chair/bed transfer  Transfers assist     Chair/bed transfer assist level: Contact Guard/Touching assist     Locomotion Ambulation   Ambulation assist      Assist level: Contact Guard/Touching assist Assistive device: No Device Max distance: 500   Walk 10 feet activity   Assist     Assist level: Contact Guard/Touching assist Assistive device: No Device   Walk 50 feet activity   Assist    Assist level: Contact Guard/Touching assist Assistive device: No Device    Walk 150 feet activity   Assist    Assist level: Contact Guard/Touching assist Assistive device: No Device    Walk 10 feet on uneven surface  activity   Assist     Assist level: Contact Guard/Touching assist Assistive device:  Walker-rolling   Wheelchair     Assist Will patient use wheelchair at discharge?: No             Wheelchair 50 feet with 2 turns activity    Assist            Wheelchair 150 feet activity     Assist          Blood pressure 128/65, pulse 69, temperature 98.2 F (36.8 C), resp. rate 18, height 6\' 1"  (1.854 m), weight 92.9 kg, SpO2 90 %.  Medical Problem List and Plan: 1.  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  Planning loop recorder.             -patient may  Shower after covered loop recorder- pt really wants hair washed  2/16- first day of therapies- pt ready.  2/18- walking with OT/RW- doing great             -ELOS/Goals: 16-18 days mod I  2/23- d/c 3/1  -Continue CIR 2.  Antithrombotics: -DVT/anticoagulation: SCDs             -antiplatelet therapy: Continue Aspirin 81 mg daily and Plavix 75 mg day x3 weeks and then Plavix alone 3. Pain Management: Continue Tylenol as needed  2/20- Continue tramadol 50 mg q6 hours prn for pain  2/23- improved R shoulder pain s/p injection 4. Mood: Trazodone 50 mg nightly, melatonin 3 mg nightly             -antipsychotic agents: N/A 5. Neuropsych: This patient is capable of making decisions on his own behalf. 6. Skin/Wound Care: Routine skin checks. Loop recorder soiled- dressing changed 7. Fluids/Electrolytes/Nutrition: Routine in and outs with follow-up chemistries 8.  Dysphagia.  Dysphagia #1 honey thick liquids.  Follow-up speech therapy. Will check Cr/BUN in Am- if significantly elevated, might need night IVFs for now Monitor hydration  2/16- BUN and Cr NOT elevated so will con't to monitor 2x/week and add IVFs if cannot keep up his hydration with honey thick liquids.   2/17 MBS today to see if can upgrade diet/liquids  2/18- is now on nectar thick liquids- has been upgraded   2/21- water protocol- as well- BUN?CR not elevated. - con't regimen 9.  Hyperlipidemia.   Lipitor 10.  Hypothyroidism.  Continue Tapazole 11.  Tobacco abuse.  NicoDerm patch.  Provide counseling- smoked 2ppd- ok with Nicoderm  2/17- asked if could increase Patch- we cannot, but will add Gum if possible 12.  BPH.  Flomax 0.4 mg daily.  Check PVR 13.  GERD.  Protonix 14.  CAD with history of  stenting.  Continue Plavix and aspirin 15. Thrush? Will try Diflucan 200 mg x 7 days. Might require nystatin swish and spit  2/16- says mouth feels a little better- con't Diflucan  2/17 - will add Nystatin swish and spit, since cannot swallow it.   2/18- plaque is coming off tongue- 50% gone as of today  2/21- doing better- con't regimen  2/22- Plaque isn't reducing in size anymore- but mouth/tongue less erythematous- con't regimen 16. Constipation- asked for Colace  2/21- will give Sorbitol x1 for constipation- con't regimen  2/22- LBM yesterday- con't regimen 17. Anxiety/depression  2/16- common in new stroke patients- will have pt seen by Neuropsych and start Paxil 10 mg QHS for both Sx's.  2/17- says worried might be "something else"- feels it in epigastric area? Doesn't sounds like GERD  2/18- will change to Celexa per pt request.  18. Muscle spasms  2/17- Will add Baclofen 5 mg QID for spasticity/muscle spasms  2/18- muscle spasms 50-75% better   2/23- still having cramps, which is different than "muscle spasms"- don't want to increase Baclofen or add another medicine- con't pain meds prn 19. Xerostomia: lip balm ordered to help moisturize 20.Marland Kitchen Blurry vision  2/21- is B/L per pt- will see how it goes- not DM to cause blurry vision- con't monitor 21/ R shoulder chronic pain  2/22- R shoulder injection today  2/23- better s/p injection 22. Urinary retention  2/22- will start Flomax, but also check U/A and Cx to make sure not brewing UTI.   2/23- improved on Flomax- however U/A is (-) 23. Rash on back  2/22- will start Cortisone cream TID.  2/23- itching better, but rash looks  the same- con't regimen  24. Dispo  2/16- will d/c IV for now- bothering pt.   2/18- will give B12 shot- is due and Tuesday will do R shoulder injection   2/22 Went over risks/benefits of R shoulder steroid injection- consent signed.  steroid injection was performed at R posterior RTC shoulder injection using 1% plain Lidocaine and 40mg  /1cc of Kenalog. This was well tolerated.  Cleaned with betadine x3 and allowed to dry- then alcohol then injected using 27 gauge 1.5 inch needle- no bleeding or complications.    F/U in 3 months for steroid injections of shoulder on R.   Lidocaine will kick in 15 minutes- and wear off tonight- the steroid will kick in tomorrow within 24 hours and take up to 72 hours to fully kick in.     LOS: 8 days A FACE TO FACE EVALUATION WAS PERFORMED  Luciano Cinquemani 03/03/2020, 8:35 AM

## 2020-03-04 ENCOUNTER — Inpatient Hospital Stay (HOSPITAL_COMMUNITY): Payer: Medicare Other

## 2020-03-04 LAB — GLUCOSE, CAPILLARY: Glucose-Capillary: 111 mg/dL — ABNORMAL HIGH (ref 70–99)

## 2020-03-04 NOTE — Progress Notes (Signed)
Modified Barium Swallow Progress Note  Patient Details  Name: Thi Sisemore MRN: [id number] Date of Birth: [birth year]  Today's Date: 03/04/2020  Modified Barium Swallow completed.  Full report located under Chart Review in the Imaging Section.  Brief recommendations include the following:  Clinical Impression  Patient presents with some improvement in swallow function as compared to most recent MBS on 2/17. He did exhibit swallow initiation delays to level of vallcular sinus with thin liquids however this was to level of pyriform sinus during previous study. He exhibited trace penetration and trace flash penetration with thin liquids which occured two times during swallow. No aspiration observed before, during or after the swallow. Patient does continue to have min-moderate amount of vallecular and pyriform sinus residuals with thin liquids, puree solids, regular solids and dysphagia 3 solids, requiring multiple dry swallows to start to clear. (Full clearance of residuals was never achieved.) During consumption of mechanical soft and regular solids, patient commented that he "cant swallow it" and did demonstrate delay in swallow initiatoin and pharyngeal transit of solids boluses. Although patient did not exhibit any aspiraiton of pharyngeal residuals, he will remain at risk of aspiration. SLP recommending upgrade liquids to thin (from nectar thick) and continue with Dys 1 solids.   Swallow Evaluation Recommendations       SLP Diet Recommendations: Dysphagia 1 (Puree) solids;Thin liquid   Liquid Administration via: Cup;Straw   Medication Administration: Crushed with puree   Supervision: Patient able to self feed;Intermittent supervision to cue for compensatory strategies   Compensations: Minimize environmental distractions;Slow rate;Small sips/bites;Follow solids with liquid;Clear throat intermittently   Postural Changes: Seated upright at 90 degrees;Remain semi-upright after after  feeds/meals (Comment)   Oral Care Recommendations: Oral care BID       Sonia Baller, MA, CCC-SLP Speech Therapy

## 2020-03-04 NOTE — Progress Notes (Signed)
Occupational Therapy Session Note  Patient Details  Name: David Hendricks MRN: [id number] Date of Birth: 1950/09/27  Today's Date: 03/04/2020 OT Individual Time: 1100-1145 OT Individual Time Calculation (min): 45 min    Short Term Goals: Week 1:  OT Short Term Goal 1 (Week 1): Pt will complete LB bathe/dress close Supervision OT Short Term Goal 1 - Progress (Week 1): Met OT Short Term Goal 2 (Week 1): Pt will perform functional mobility in prep for ADL with LRAD w/ CS OT Short Term Goal 2 - Progress (Week 1): Met OT Short Term Goal 3 (Week 1): Pt will perform 3/3 toileting tasks with CS OT Short Term Goal 3 - Progress (Week 1): Met OT Short Term Goal 4 (Week 1): Pt will exhibit good safety awareness during ADL with no more than occasional cues OT Short Term Goal 4 - Progress (Week 1): Met  Skilled Therapeutic Interventions/Progress Updates:    1;1. Pt received in bed agreeable to OT. Pt completes dressing in bed requiring set up to gather clothing items. Pt reporting shoulder pain improved with medications stating, "yall have good drugs around here." Pt completes SPT with CGA. Pt makes cup of coffee managing packages/containers with 1 spill. Pt reporting liking taking tub baths. Pt able to get into tub with cuing and out of tub with steadying A and Vc for problem solving method to bring strong leg up first. Pt educated on draining water and drying off self and floor of tub as much as possible and having person present for getting into/out of tub. Pt verbalized understanding. Pt completes standing balance on Wii fit board with A only to facilitate weight shifting from hips as pt sticks head forward and butt backwards to make weight shift incorrectly. Exited session with pt seated in recliner, exit alarm on and call light in reach   Therapy Documentation Precautions:  Precautions Precautions: Fall Precaution Comments: Impulsive Restrictions Weight Bearing Restrictions: No General:   Vital  Signs: Therapy Vitals Temp: 97.9 F (36.6 C) Pulse Rate: 70 Resp: 18 BP: 127/68 Patient Position (if appropriate): Lying Oxygen Therapy SpO2: 92 % O2 Device: Room Air Pain:   ADL: ADL Eating: Not assessed Grooming: Setup Upper Body Bathing: Supervision/safety Where Assessed-Upper Body Bathing: Shower Lower Body Bathing: Contact guard Where Assessed-Lower Body Bathing: Shower Upper Body Dressing: Supervision/safety Lower Body Dressing: Minimal assistance Toileting: Contact guard Toilet Transfer: Armed forces logistics/support/administrative officer    Praxis   Exercises:   Other Treatments:     Therapy/Group: Individual Therapy  Tonny Branch 03/04/2020, 6:52 AM

## 2020-03-04 NOTE — Progress Notes (Signed)
Physical Therapy Session Note  Patient Details  Name: David Hendricks MRN: [id number] Date of Birth: 05-Nov-1950  Today's Date: 03/04/2020 PT Individual Time: 1405-1500 PT Individual Time Calculation (min): 55 min   Short Term Goals: Week 1:  PT Short Term Goal 1 (Week 1): Pt will complete least restrictive transfer at Supervision level PT Short Term Goal 1 - Progress (Week 1): Progressing toward goal PT Short Term Goal 2 (Week 1): Pt will ambulate x 150 ft with LRAD and Supervision PT Short Term Goal 2 - Progress (Week 1): Progressing toward goal PT Short Term Goal 3 (Week 1): Pt will complete objective balance measure to determine fall risk PT Short Term Goal 3 - Progress (Week 1): Met Week 2:  PT Short Term Goal 1 (Week 2): =LTG due to ELOS Week 3:     Skilled Therapeutic Interventions/Progress Updates:    PAIN denies pain.  Pt seen this pm for 55 min session w/focus on functional gait, duel task challenges, balance.  Pt initially supine and being cathed by nursing.  Session delayed x 5 min.  Pt then agreeable to treatment requesting "light session" due to discomfort of cath procedure.  Sit to stand from bed w/cga, somewhat impulsive and gait x 156f w/ cga.  Functional gait:  Gait carrying weighted basket x 1537frepeated x 2 w/cga, demonstrates impulsive behavior during gait thrusting basket at NT in joking manner, min assist for balance, unreceptive to safety ed.  Gait weaving thru cones placed at [id number] x [id number], inconsistent cadence.  Gait Picking up cones # 9 x [id number], fumbles cones due to impaired LUE coordination but no dizzyness or balance loss w/task   Sit to stand - pt moves impulsively at times, resistant to therapist guarding states "I am fine" "you don't have to get excited".  Discussed balance deficits noted w/balance activities and prior testing and pt denies any difficulty w/tasks, becomes defensive to ed/suggestion of falls risk based on  testing.  Standing balance: on foam - min assist, increased sway                                             Cervical nods - min to mod assist due to post tendency                                            Eyes closed - min assist, further increase in sway which progressed over 10 sec count, pt stumbles post, mod assist to assist safely to sitting on mat.  Educated pt on implications of balance imapirments/low light and dark environments, pt states "I know my house by heart, I wont have any problem".  Sitting ball bounce as dual UE task, repeated in standing w/cga, mild decrease in LUE coordination, no balance loss.  Gait day room to pt room approx 100ft 93fa, pt complaining about therapist guarding pt, attempted to educate pt on fall risk but unreceptive.   Pt impulsive w/poor insight into balance deficits, poor safety awareness. Therapy Documentation Precautions:  Precautions Precautions: Fall Precaution Comments: Impulsive Restrictions Weight Bearing Restrictions: No    Therapy/Group: Individual Therapy  BarbarCallie Fielding BEarly2022, 3:59 PM

## 2020-03-04 NOTE — Progress Notes (Signed)
PROGRESS NOTE   Subjective/Complaints:  R shoulder is hurting him this AM- wondering if should do "therapy"- I explained do what he's able- had tramadol at 6 am and will get tylenol now.  Has an itchy spot on L anterior arm/shoulder- rash on back itchiness resolved.  Has MBS today.   ROS:   Pt denies SOB, abd pain, CP, N/V/C/D, and vision changes   Objective:   DG Swallowing Func-Speech Pathology  Result Date: 03/04/2020 Objective Swallowing Evaluation: Type of Study: MBS-Modified Barium Swallow Study  Patient Details Name: David Hendricks MRN: [id number] Date of Birth: [birth year] Today's Date: 03/04/2020 Time: SLP Start Time (ACUTE ONLY): 4536 -SLP Stop Time (ACUTE ONLY): 1534 SLP Time Calculation (min) (ACUTE ONLY): 20 min Past Medical History: Past Medical History: Diagnosis Date . Allergy  . Anxiety  . Aortic atherosclerosis ([place])  . Arthritis  . Benign prostatic hyperplasia (BPH) with urinary urgency  . Chronic kidney disease   KIDNEY STONES . COPD (chronic obstructive pulmonary disease) ([place])   possible  . Coronary artery disease   10/18 PCI/DES to pLAD, mild nonobstructive disease in the Lcx/RCA. Normal EF.  Marland Kitchen Depression  . Dyslipidemia  . Dysrhythmia 2016  irregular heartbeat . Gallstones  . GERD (gastroesophageal reflux disease)  . History of kidney stones  . Hyperlipidemia  . Hyperthyroidism  . Leg cramps  . Multiple pulmonary nodules  . Myocardial infarction ([place])   2018 . Pneumonia  . Seasonal allergies  . Thyroid nodule  . Vertigo  . Vitamin B 12 deficiency  Past Surgical History: Past Surgical History: Procedure Laterality Date . COLONOSCOPY   . CORONARY ANGIOPLASTY  10/09/2016 . CORONARY STENT INTERVENTION N/A 10/09/2016  Procedure: CORONARY STENT INTERVENTION;  Surgeon: Nelva Bush, MD;  Location: [facility];  Service: Cardiovascular;  Laterality: N/A; . CYSTOSCOPY WITH RETROGRADE PYELOGRAM, URETEROSCOPY AND STENT  PLACEMENT Left 06/17/2019  Procedure: CYSTOSCOPY WITH RETROGRADE PYELOGRAM, URETEROSCOPY AND STENT PLACEMENT;  Surgeon: Robley Fries, MD;  Location: [facility];  Service: Urology;  Laterality: Left;  1 HR . HOLMIUM LASER APPLICATION Left 04/15/8030  Procedure: HOLMIUM LASER APPLICATION;  Surgeon: Robley Fries, MD;  Location: [facility];  Service: Urology;  Laterality: Left; . INTRAVASCULAR ULTRASOUND/IVUS N/A 10/09/2016  Procedure: Intravascular Ultrasound/IVUS;  Surgeon: Nelva Bush, MD;  Location: [facility];  Service: Cardiovascular;  Laterality: N/A; . IR CHOLANGIOGRAM EXISTING TUBE  11/18/2018 . IR PERC CHOLECYSTOSTOMY  11/16/2018 . LEFT HEART CATH AND CORONARY ANGIOGRAPHY N/A 10/09/2016  Procedure: LEFT HEART CATH AND CORONARY ANGIOGRAPHY;  Surgeon: Nelva Bush, MD;  Location: [facility];  Service: Cardiovascular;  Laterality: N/A; . LOOP RECORDER INSERTION N/A 02/20/2020  Procedure: LOOP RECORDER INSERTION;  Surgeon: Vickie Epley, MD;  Location: [facility];  Service: Cardiovascular;  Laterality: N/A; HPI: Pt is a 70 y/o male admitted secondary to slurred speech and L sided weakness. 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 PMH includes tobacco use, CAD, COPD, anxiety, and depression.  Subjective: alert, cooperative, dysarthric Assessment / Plan / Recommendation CHL IP CLINICAL IMPRESSIONS 03/04/2020 Clinical Impression Patient presents with some improvement in swallow function as compared to most recent MBS on  2/17. He did exhibit swallow initiation delays to level of vallcular sinus with thin liquids however this was to level of pyriform sinus during previous study. He exhibited trace penetration and trace flash penetration with thin liquids which occured two times during swallow. No aspiration observed before, during or after the swallow. Patient does continue to have min-moderate amount of vallecular and pyriform sinus residuals with thin liquids, puree solids,  regular solids and dysphagia 3 solids, requiring multiple dry swallows to start to clear. (Full clearance of residuals was never achieved.) During consumption of mechanical soft and regular solids, patient commented that he "cant swallow it" and did demonstrate delay in swallow initiatoin and pharyngeal transit of solids boluses. Although patient did not exhibit any aspiraiton of pharyngeal residuals, he will remain at risk of aspiration. SLP recommending upgrade liquids to thin (from nectar thick) and continue with Dys 1 solids. SLP Visit Diagnosis Dysphagia, oropharyngeal phase (R13.12) Attention and concentration deficit following -- Frontal lobe and executive function deficit following -- Impact on safety and function Mild aspiration risk   CHL IP TREATMENT RECOMMENDATION 03/04/2020 Treatment Recommendations Therapy as outlined in treatment plan below   Prognosis 02/18/2020 Prognosis for Safe Diet Advancement Good Barriers to Reach Goals Cognitive deficits;Severity of deficits Barriers/Prognosis Comment -- CHL IP DIET RECOMMENDATION 03/04/2020 SLP Diet Recommendations Dysphagia 1 (Puree) solids;Thin liquid Liquid Administration via Cup;Straw Medication Administration Crushed with puree Compensations Minimize environmental distractions;Slow rate;Small sips/bites;Follow solids with liquid;Clear throat intermittently Postural Changes Seated upright at 90 degrees;Remain semi-upright after after feeds/meals (Comment)   CHL IP OTHER RECOMMENDATIONS 03/04/2020 Recommended Consults -- Oral Care Recommendations Oral care BID Other Recommendations --   CHL IP FOLLOW UP RECOMMENDATIONS 02/22/2020 Follow up Recommendations Inpatient Rehab   CHL IP FREQUENCY AND DURATION 02/18/2020 Speech Therapy Frequency (ACUTE ONLY) min 2x/week Treatment Duration 2 weeks      CHL IP ORAL PHASE 03/04/2020 Oral Phase Impaired Oral - Pudding Teaspoon -- Oral - Pudding Cup -- Oral - Honey Teaspoon -- Oral - Honey Cup NT Oral - Nectar Teaspoon NT Oral  - Nectar Cup NT Oral - Nectar Straw -- Oral - Thin Teaspoon -- Oral - Thin Cup WFL Oral - Thin Straw WFL Oral - Puree Delayed oral transit Oral - Mech Soft -- Oral - Regular Reduced posterior propulsion;Impaired mastication Oral - Multi-Consistency -- Oral - Pill WFL Oral Phase - Comment --  CHL IP PHARYNGEAL PHASE 03/04/2020 Pharyngeal Phase Impaired Pharyngeal- Pudding Teaspoon -- Pharyngeal -- Pharyngeal- Pudding Cup -- Pharyngeal -- Pharyngeal- Honey Teaspoon -- Pharyngeal -- Pharyngeal- Honey Cup NT Pharyngeal -- Pharyngeal- Nectar Teaspoon NT Pharyngeal -- Pharyngeal- Nectar Cup NT Pharyngeal -- Pharyngeal- Nectar Straw -- Pharyngeal -- Pharyngeal- Thin Teaspoon NT Pharyngeal -- Pharyngeal- Thin Cup Delayed swallow initiation-vallecula;Penetration/Aspiration during swallow;Pharyngeal residue - pyriform;Pharyngeal residue - valleculae Pharyngeal Material enters airway, remains ABOVE vocal cords then ejected out Pharyngeal- Thin Straw Delayed swallow initiation-vallecula;Pharyngeal residue - valleculae;Pharyngeal residue - pyriform;Penetration/Aspiration during swallow Pharyngeal Material enters airway, remains ABOVE vocal cords then ejected out Pharyngeal- Puree Pharyngeal residue - valleculae;Pharyngeal residue - pyriform Pharyngeal Material does not enter airway Pharyngeal- Mechanical Soft Pharyngeal residue - pyriform;Pharyngeal residue - valleculae;Reduced pharyngeal peristalsis Pharyngeal Material does not enter airway Pharyngeal- Regular Reduced pharyngeal peristalsis;Pharyngeal residue - valleculae;Pharyngeal residue - pyriform Pharyngeal Material does not enter airway Pharyngeal- Multi-consistency -- Pharyngeal -- Pharyngeal- Pill WFL Pharyngeal Material does not enter airway Pharyngeal Comment --  CHL IP CERVICAL ESOPHAGEAL PHASE 03/04/2020 Cervical Esophageal Phase WFL Pudding Teaspoon -- Pudding Cup --  Honey Teaspoon -- Honey Cup -- Nectar Teaspoon -- Nectar Cup -- Nectar Straw -- Thin Teaspoon -- Thin  Cup -- Thin Straw -- Puree -- Mechanical Soft -- Regular -- Multi-consistency -- Pill -- Cervical Esophageal Comment -- Sonia Baller, MA, CCC-SLP Speech Therapy             No results for input(s): WBC, HGB, HCT, PLT in the last 72 hours. No results for input(s): NA, K, CL, CO2, GLUCOSE, BUN, CREATININE, CALCIUM in the last 72 hours.  Intake/Output Summary (Last 24 hours) at 03/04/2020 1800 Last data filed at 03/04/2020 1402 Gross per 24 hour  Intake 120 ml  Output 1200 ml  Net -1080 ml        Physical Exam: Vital Signs Blood pressure 130/60, pulse 79, temperature 98 F (36.7 C), resp. rate 18, height 6\' 1"  (1.854 m), weight 92.9 kg, SpO2 93 %.  Gen: sitting up in bed; appropriate, but upset about R shoulder pain, NAD HEENT: c/o blurry vision- hard to truly test; conjugate gaze; tongue has white plaque in middle Cardio: RRR- no JVD Chest: CTA B/L- no W/R/R- good air movement Abd: Soft, NT, ND, (+)BS   Ext: no edema Psych: appropriate Skin: intact but has patchy red rash on L upper back intact- rash on back resolved; still has some on L upper armuloskeletal:     Cervical back: Normal range of motion and neck supple. TTP over R RTC/shoulder    Comments: RUE 5/5 in biceps, triceps, WE, grip and finger abd LUE- 5-/5 except finger abd 4+/5 RLE- 5/5 in JF, KE, DF and PF LLE- 5-/5 in same muscles Neurological: Ox3- Hoffman's on LUE   Speech is mildly dysarthric but intelligible- mildly better today- can understand better Ox3- blurry vision, "even with eyeglasses".  Light touch intact x 4 extremities       Assessment/Plan: 1. Functional deficits which require 3+ hours per day of interdisciplinary therapy in a comprehensive inpatient rehab setting.  Physiatrist is providing close team supervision and 24 hour management of active medical problems listed below.  Physiatrist and rehab team continue to assess barriers to discharge/monitor patient progress toward functional and  medical goals  Care Tool:  Bathing    Body parts bathed by patient: Right arm,Left arm,Chest,Abdomen,Front perineal area,Buttocks,Right upper leg,Left upper leg,Right lower leg,Left lower leg,Face         Bathing assist Assist Level: Supervision/Verbal cueing     Upper Body Dressing/Undressing Upper body dressing   What is the patient wearing?: Pull over shirt    Upper body assist Assist Level: Set up assist    Lower Body Dressing/Undressing Lower body dressing      What is the patient wearing?: Pants     Lower body assist Assist for lower body dressing: Supervision/Verbal cueing     Toileting Toileting    Toileting assist Assist for toileting: Contact Guard/Touching assist     Transfers Chair/bed transfer  Transfers assist     Chair/bed transfer assist level: Contact Guard/Touching assist     Locomotion Ambulation   Ambulation assist      Assist level: Contact Guard/Touching assist Assistive device: No Device Max distance: 150   Walk 10 feet activity   Assist     Assist level: Contact Guard/Touching assist Assistive device: No Device   Walk 50 feet activity   Assist    Assist level: Contact Guard/Touching assist Assistive device: No Device    Walk 150 feet activity   Assist  Assist level: Contact Guard/Touching assist Assistive device: No Device    Walk 10 feet on uneven surface  activity   Assist     Assist level: Moderate Assistance - Patient - 50 - 74% Assistive device: Other (comment) (no device)   Wheelchair     Assist Will patient use wheelchair at discharge?: No             Wheelchair 50 feet with 2 turns activity    Assist            Wheelchair 150 feet activity     Assist          Blood pressure 130/60, pulse 79, temperature 98 F (36.7 C), resp. rate 18, height 6\' 1"  (1.854 m), weight 92.9 kg, SpO2 93 %.  Medical Problem List and Plan: 1.  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  Planning loop recorder.             -patient may  Shower after covered loop recorder- pt really wants hair washed  2/16- first day of therapies- pt ready.  2/18- walking with OT/RW- doing great  2/24- s/p R shoulder steroid injection 2/22              -ELOS/Goals: 16-18 days mod I  -Continue CIR 2.  Antithrombotics: -DVT/anticoagulation: SCDs             -antiplatelet therapy: Continue Aspirin 81 mg daily and Plavix 75 mg day x3 weeks and then Plavix alone 3. Pain Management: Continue Tylenol as needed  2/20- Continue tramadol 50 mg q6 hours prn for pain 4. Mood: Trazodone 50 mg nightly, melatonin 3 mg nightly  2/24- con't tramadol and tylenol- might need to increase tramadol dose?             -antipsychotic agents: N/A 5. Neuropsych: This patient is capable of making decisions on his own behalf. 6. Skin/Wound Care: Routine skin checks. Loop recorder soiled- dressing changed 7. Fluids/Electrolytes/Nutrition: Routine in and outs with follow-up chemistries 8.  Dysphagia.  Dysphagia #1 honey thick liquids.  Follow-up speech therapy. Will check Cr/BUN in Am- if significantly elevated, might need night IVFs for now Monitor hydration  2/16- BUN and Cr NOT elevated so will con't to monitor 2x/week and add IVFs if cannot keep up his hydration with honey thick liquids.   2/17 MBS today to see if can upgrade diet/liquids  2/18- is now on nectar thick liquids- has been upgraded   2/21- water protocol- as well- BUN?CR not elevated. - con't regimen  2/24- now D1 thin liquids per MBS 9.  Hyperlipidemia.  Lipitor 10.  Hypothyroidism.  Continue Tapazole 11.  Tobacco abuse.  NicoDerm patch.  Provide counseling- smoked 2ppd- ok with Nicoderm  2/17- asked if could increase Patch- we cannot, but will add Gum if possible 12.  BPH.  Flomax 0.4 mg daily.  Check PVR 13.  GERD.  Protonix 14.  CAD with history of stenting.  Continue Plavix and  aspirin 15. Thrush? Will try Diflucan 200 mg x 7 days. Might require nystatin swish and spit  2/16- says mouth feels a little better- con't Diflucan  2/17 - will add Nystatin swish and spit, since cannot swallow it.   2/18- plaque is coming off tongue- 50% gone as of today  2/21- doing better- con't regimen  2/22- Plaque isn't reducing in size anymore- but mouth/tongue less erythematous- con't regimen 16. Constipation- asked for Colace  2/21- will give  Sorbitol x1 for constipation- con't regimen  2/22- LBM yesterday- con't regimen 17. Anxiety/depression  2/16- common in new stroke patients- will have pt seen by Neuropsych and start Paxil 10 mg QHS for both Sx's.  2/17- says worried might be "something else"- feels it in epigastric area? Doesn't sounds like GERD  2/18- will change to Celexa per pt request.  18. Muscle spasms  2/17- Will add Baclofen 5 mg QID for spasticity/muscle spasms  2/18- muscle spasms 50-75% better  19. Xerostomia: lip balm ordered to help moisturize 20.Marland Kitchen Blurry vision  2/21- is B/L per pt- will see how it goes- not DM to cause blurry vision- con't monitor 21/ R shoulder chronic pain  2/22- R shoulder injection today  2/24- bothering him again- suggested oral meds- can take up to 3 days for steroid to kick in 22. Urinary retention  2/22- will start Flomax, but also check U/A and Cx to make sure not brewing UTI.  23. Rash on back  2/22- will start Cortisone cream TID.  2/24- resolved- now on L anterior arm- has patch- asked nursing to use cream there  24. Dispo  2/16- will d/c IV for now- bothering pt.   2/18- will give B12 shot- is due and Tuesday will do R shoulder injection    Went over risks/benefits of R shoulder steroid injection- consent signed.  steroid injection was performed at R posterior RTC shoulder injection using 1% plain Lidocaine and 40mg  /1cc of Kenalog. This was well tolerated.  Cleaned with betadine x3 and allowed to dry- then alcohol then  injected using 27 gauge 1.5 inch needle- no bleeding or complications.    F/U in 3 months for steroid injections of shoulder on R.   Lidocaine will kick in 15 minutes- and wear off tonight- the steroid will kick in tomorrow within 24 hours and take up to 72 hours to fully kick in.      LOS: 9 days A FACE TO FACE EVALUATION WAS PERFORMED  Jleigh Striplin 03/04/2020, 6:00 PM

## 2020-03-04 NOTE — Progress Notes (Signed)
Physical Therapy Session Note  Patient Details  Name: David Hendricks MRN: [id number] Date of Birth: 02-24-1950  Today's Date: 03/04/2020 PT Individual Time: 0800-0900 PT Individual Time Calculation (min): 60 min   Short Term Goals: Week 1:  PT Short Term Goal 1 (Week 1): Pt will complete least restrictive transfer at Supervision level PT Short Term Goal 1 - Progress (Week 1): Progressing toward goal PT Short Term Goal 2 (Week 1): Pt will ambulate x 150 ft with LRAD and Supervision PT Short Term Goal 2 - Progress (Week 1): Progressing toward goal PT Short Term Goal 3 (Week 1): Pt will complete objective balance measure to determine fall risk PT Short Term Goal 3 - Progress (Week 1): Met Week 2:  PT Short Term Goal 1 (Week 2): =LTG due to ELOS  Skilled Therapeutic Interventions/Progress Updates:    Pt supine in bed upon PT arrival. Pt receiving morning medications from RN and agreeable to PT session. Pt donned clothes with set up A. LB dressing performed in supine while UB performed seated EOB. Patient c/o pain in R shoulder pain that improves throughout session d/t tramadol received in morning medications.   Therapeutic Activity: Bed Mobility: Patient performed supine --> sit with supervision. Provided verbal cues for contracting abdominals/ core prior to roll to side followed by push up to seated position on EOB. Transfers: Patient performed STS and SPVT transfers throughout session with supervision and multiple attempts or extra time to complete. Minimal vc for LE positioning and/ or technique. At end of session, pt performs toilet transfer with supervision just prior to transport for swallow study.   Gait Training:  Patient ambulated 150' x2 using RW for one bout and no AD for 2nd bout with close supervision. Pt also amb 300' x2 with no AD and supervision with pt participating in aspects of dynamic gait including head turns. Horizontal head turns cause change in straight forward path.   Provided vc for improving heel strike, upright posture.   Neuromuscular Re-ed: NMR facilitated during session with focus on standing static and dynamic balance. Pt guided in dynamic reach with reciprocal stepping to replicate designs on tray table, static standing balance challenge with changes in arm positioning and catching 3# ball on even floor and standing on Airex pad. No LOB noted. Good ankle strategy employed.   NMR performed for improvements in motor control and coordination, balance, sequencing, judgement, and self confidence/ efficacy in performing all aspects of mobility at highest level of independence.   Patient seated in w/c at end of session with brakes locked, belt alarm set, and all needs within reach.  Therapy Documentation Precautions:  Precautions Precautions: Fall Precaution Comments: Impulsive Restrictions Weight Bearing Restrictions: No  Therapy/Group: Individual Therapy  Alger Simons 03/04/2020, 6:52 PM

## 2020-03-04 NOTE — Progress Notes (Signed)
Patient ID: David Hendricks, male   DOB: 1951/01/07, 70 y.o.   MRN: [id number]  SW provided pt with [facility] list (via [email or web address].[email or web address]), and [facility] home care agency list. SW to follow-up about preference.  *preferred [facility]- [facility]. SW will follow-up about [facility] agency.   SW sent referral to [facility] and waiting on follow-up.   Loralee Pacas, MSW, [place] Office: [phone] Cell: [phone] Fax: [phone]

## 2020-03-05 DIAGNOSIS — F32A Depression, unspecified: Secondary | ICD-10-CM

## 2020-03-05 DIAGNOSIS — F419 Anxiety disorder, unspecified: Secondary | ICD-10-CM

## 2020-03-05 MED ORDER — MAGNESIUM OXIDE 400 (241.3 MG) MG PO TABS
400.0000 mg | ORAL_TABLET | Freq: Every day | ORAL | Status: DC
  Administered 2020-03-05 – 2020-03-09 (×5): 400 mg via ORAL
  Filled 2020-03-05 (×5): qty 1

## 2020-03-05 MED ORDER — LIDOCAINE 5 % EX PTCH
1.0000 | MEDICATED_PATCH | CUTANEOUS | Status: DC
  Administered 2020-03-06 – 2020-03-09 (×4): 1 via TRANSDERMAL
  Filled 2020-03-05 (×4): qty 1

## 2020-03-05 NOTE — Progress Notes (Signed)
Patient ID: David Hendricks, male   DOB: May 22, 1950, 70 y.o.   MRN: [id number]   HHPT/OT/SLP/aide/SW referral accepted by [facility].   SW faxed [facility] referral to [facility] [phone]).  Loralee Pacas, MSW, [place] Office: [phone] Cell: [phone] Fax: [phone]

## 2020-03-05 NOTE — Progress Notes (Signed)
PROGRESS NOTE   Subjective/Complaints:  Pt reports R shoulder is hurting, but upon more questioning, has pain around shoulder blade, not shoulder joint- discussed options to help- will try Lidoderm patch on R shoulder blade/shoulder/.   R ear "slept on wrong"= feels irritated.  On D1 thin diet now.    ROS:  Pt denies SOB, abd pain, CP, N/V/C/D, and vision changes   Objective:   DG Swallowing Func-Speech Pathology  Result Date: 03/04/2020 Objective Swallowing Evaluation: Type of Study: MBS-Modified Barium Swallow Study  Patient Details Name: David Hendricks MRN: [id number] Date of Birth: [birth year] Today's Date: 03/04/2020 Time: SLP Start Time (ACUTE ONLY): 7619 -SLP Stop Time (ACUTE ONLY): 1534 SLP Time Calculation (min) (ACUTE ONLY): 20 min Past Medical History: Past Medical History: Diagnosis Date . Allergy  . Anxiety  . Aortic atherosclerosis ([place])  . Arthritis  . Benign prostatic hyperplasia (BPH) with urinary urgency  . Chronic kidney disease   KIDNEY STONES . COPD (chronic obstructive pulmonary disease) ([place])   possible  . Coronary artery disease   10/18 PCI/DES to pLAD, mild nonobstructive disease in the Lcx/RCA. Normal EF.  Marland Kitchen Depression  . Dyslipidemia  . Dysrhythmia 2016  irregular heartbeat . Gallstones  . GERD (gastroesophageal reflux disease)  . History of kidney stones  . Hyperlipidemia  . Hyperthyroidism  . Leg cramps  . Multiple pulmonary nodules  . Myocardial infarction ([place])   2018 . Pneumonia  . Seasonal allergies  . Thyroid nodule  . Vertigo  . Vitamin B 12 deficiency  Past Surgical History: Past Surgical History: Procedure Laterality Date . COLONOSCOPY   . CORONARY ANGIOPLASTY  10/09/2016 . CORONARY STENT INTERVENTION N/A 10/09/2016  Procedure: CORONARY STENT INTERVENTION;  Surgeon: Nelva Bush, MD;  Location: [facility];  Service: Cardiovascular;  Laterality: N/A; . CYSTOSCOPY WITH RETROGRADE PYELOGRAM, URETEROSCOPY  AND STENT PLACEMENT Left 06/17/2019  Procedure: CYSTOSCOPY WITH RETROGRADE PYELOGRAM, URETEROSCOPY AND STENT PLACEMENT;  Surgeon: Robley Fries, MD;  Location: [facility];  Service: Urology;  Laterality: Left;  1 HR . HOLMIUM LASER APPLICATION Left 5/0/9326  Procedure: HOLMIUM LASER APPLICATION;  Surgeon: Robley Fries, MD;  Location: [facility];  Service: Urology;  Laterality: Left; . INTRAVASCULAR ULTRASOUND/IVUS N/A 10/09/2016  Procedure: Intravascular Ultrasound/IVUS;  Surgeon: Nelva Bush, MD;  Location: [facility];  Service: Cardiovascular;  Laterality: N/A; . IR CHOLANGIOGRAM EXISTING TUBE  11/18/2018 . IR PERC CHOLECYSTOSTOMY  11/16/2018 . LEFT HEART CATH AND CORONARY ANGIOGRAPHY N/A 10/09/2016  Procedure: LEFT HEART CATH AND CORONARY ANGIOGRAPHY;  Surgeon: Nelva Bush, MD;  Location: [facility];  Service: Cardiovascular;  Laterality: N/A; . LOOP RECORDER INSERTION N/A 02/20/2020  Procedure: LOOP RECORDER INSERTION;  Surgeon: Vickie Epley, MD;  Location: [facility];  Service: Cardiovascular;  Laterality: N/A; HPI: Pt is a 70 y/o male admitted secondary to slurred speech and L sided weakness. 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 PMH includes tobacco use, CAD, COPD, anxiety, and depression.  Subjective: alert, cooperative, dysarthric Assessment / Plan / Recommendation CHL IP CLINICAL IMPRESSIONS 03/04/2020 Clinical Impression Patient presents with some improvement in swallow function as compared to most recent MBS on 2/17.  He did exhibit swallow initiation delays to level of vallcular sinus with thin liquids however this was to level of pyriform sinus during previous study. He exhibited trace penetration and trace flash penetration with thin liquids which occured two times during swallow. No aspiration observed before, during or after the swallow. Patient does continue to have min-moderate amount of vallecular and pyriform sinus residuals with thin liquids, puree  solids, regular solids and dysphagia 3 solids, requiring multiple dry swallows to start to clear. (Full clearance of residuals was never achieved.) During consumption of mechanical soft and regular solids, patient commented that he "cant swallow it" and did demonstrate delay in swallow initiatoin and pharyngeal transit of solids boluses. Although patient did not exhibit any aspiraiton of pharyngeal residuals, he will remain at risk of aspiration. SLP recommending upgrade liquids to thin (from nectar thick) and continue with Dys 1 solids. SLP Visit Diagnosis Dysphagia, oropharyngeal phase (R13.12) Attention and concentration deficit following -- Frontal lobe and executive function deficit following -- Impact on safety and function Mild aspiration risk   CHL IP TREATMENT RECOMMENDATION 03/04/2020 Treatment Recommendations Therapy as outlined in treatment plan below   Prognosis 02/18/2020 Prognosis for Safe Diet Advancement Good Barriers to Reach Goals Cognitive deficits;Severity of deficits Barriers/Prognosis Comment -- CHL IP DIET RECOMMENDATION 03/04/2020 SLP Diet Recommendations Dysphagia 1 (Puree) solids;Thin liquid Liquid Administration via Cup;Straw Medication Administration Crushed with puree Compensations Minimize environmental distractions;Slow rate;Small sips/bites;Follow solids with liquid;Clear throat intermittently Postural Changes Seated upright at 90 degrees;Remain semi-upright after after feeds/meals (Comment)   CHL IP OTHER RECOMMENDATIONS 03/04/2020 Recommended Consults -- Oral Care Recommendations Oral care BID Other Recommendations --   CHL IP FOLLOW UP RECOMMENDATIONS 02/22/2020 Follow up Recommendations Inpatient Rehab   CHL IP FREQUENCY AND DURATION 02/18/2020 Speech Therapy Frequency (ACUTE ONLY) min 2x/week Treatment Duration 2 weeks      CHL IP ORAL PHASE 03/04/2020 Oral Phase Impaired Oral - Pudding Teaspoon -- Oral - Pudding Cup -- Oral - Honey Teaspoon -- Oral - Honey Cup NT Oral - Nectar Teaspoon  NT Oral - Nectar Cup NT Oral - Nectar Straw -- Oral - Thin Teaspoon -- Oral - Thin Cup WFL Oral - Thin Straw WFL Oral - Puree Delayed oral transit Oral - Mech Soft -- Oral - Regular Reduced posterior propulsion;Impaired mastication Oral - Multi-Consistency -- Oral - Pill WFL Oral Phase - Comment --  CHL IP PHARYNGEAL PHASE 03/04/2020 Pharyngeal Phase Impaired Pharyngeal- Pudding Teaspoon -- Pharyngeal -- Pharyngeal- Pudding Cup -- Pharyngeal -- Pharyngeal- Honey Teaspoon -- Pharyngeal -- Pharyngeal- Honey Cup NT Pharyngeal -- Pharyngeal- Nectar Teaspoon NT Pharyngeal -- Pharyngeal- Nectar Cup NT Pharyngeal -- Pharyngeal- Nectar Straw -- Pharyngeal -- Pharyngeal- Thin Teaspoon NT Pharyngeal -- Pharyngeal- Thin Cup Delayed swallow initiation-vallecula;Penetration/Aspiration during swallow;Pharyngeal residue - pyriform;Pharyngeal residue - valleculae Pharyngeal Material enters airway, remains ABOVE vocal cords then ejected out Pharyngeal- Thin Straw Delayed swallow initiation-vallecula;Pharyngeal residue - valleculae;Pharyngeal residue - pyriform;Penetration/Aspiration during swallow Pharyngeal Material enters airway, remains ABOVE vocal cords then ejected out Pharyngeal- Puree Pharyngeal residue - valleculae;Pharyngeal residue - pyriform Pharyngeal Material does not enter airway Pharyngeal- Mechanical Soft Pharyngeal residue - pyriform;Pharyngeal residue - valleculae;Reduced pharyngeal peristalsis Pharyngeal Material does not enter airway Pharyngeal- Regular Reduced pharyngeal peristalsis;Pharyngeal residue - valleculae;Pharyngeal residue - pyriform Pharyngeal Material does not enter airway Pharyngeal- Multi-consistency -- Pharyngeal -- Pharyngeal- Pill WFL Pharyngeal Material does not enter airway Pharyngeal Comment --  CHL IP CERVICAL ESOPHAGEAL PHASE 03/04/2020 Cervical Esophageal Phase WFL Pudding Teaspoon -- Pudding Cup -- Honey  Teaspoon -- Honey Cup -- Nectar Teaspoon -- Nectar Cup -- Nectar Straw -- Thin Teaspoon  -- Thin Cup -- Thin Straw -- Puree -- Mechanical Soft -- Regular -- Multi-consistency -- Pill -- Cervical Esophageal Comment -- Sonia Baller, MA, CCC-SLP Speech Therapy             No results for input(s): WBC, HGB, HCT, PLT in the last 72 hours. No results for input(s): NA, K, CL, CO2, GLUCOSE, BUN, CREATININE, CALCIUM in the last 72 hours.  Intake/Output Summary (Last 24 hours) at 03/05/2020 1633 Last data filed at 03/05/2020 1330 Gross per 24 hour  Intake 540 ml  Output 1750 ml  Net -1210 ml        Physical Exam: Vital Signs Blood pressure (!) 121/52, pulse 72, temperature 98.3 F (36.8 C), temperature source Oral, resp. rate 18, height 6\' 1"  (1.854 m), weight 92.9 kg, SpO2 (!) 88 %.  Gen: sleeping in bed- woke easily, appropriate, NAD HEENT: c/o blurry vision- conjugate gaze Cardio: RRR- no JVD Chest: CTA B/L- no W/R/R- good air movement Abd: Soft, NT, ND, (+)BS   Ext: no edema Psych: appropriate Skin: back rash resolved- L anterior shoulder rash improved- less red Musloskeletal: TTP over infraspinatus and scapula and supraspinatus.     Cervical back: Normal range of motion and neck supple. TTP over R RTC/shoulder    Comments: RUE 5/5 in biceps, triceps, WE, grip and finger abd LUE- 5-/5 except finger abd 4+/5 RLE- 5/5 in JF, KE, DF and PF LLE- 5-/5 in same muscles Neurological: Ox3- Hoffman's on LUE   Speech is mildly dysarthric but intelligible- mildly better today- can understand better Ox3- blurry vision, "even with eyeglasses".  Light touch intact x 4 extremities       Assessment/Plan: 1. Functional deficits which require 3+ hours per day of interdisciplinary therapy in a comprehensive inpatient rehab setting.  Physiatrist is providing close team supervision and 24 hour management of active medical problems listed below.  Physiatrist and rehab team continue to assess barriers to discharge/monitor patient progress toward functional and medical goals  Care  Tool:  Bathing    Body parts bathed by patient: Right arm,Left arm,Chest,Abdomen,Front perineal area,Buttocks,Right upper leg,Left upper leg,Right lower leg,Left lower leg,Face         Bathing assist Assist Level: Supervision/Verbal cueing     Upper Body Dressing/Undressing Upper body dressing   What is the patient wearing?: Pull over shirt    Upper body assist Assist Level: Set up assist    Lower Body Dressing/Undressing Lower body dressing      What is the patient wearing?: Pants     Lower body assist Assist for lower body dressing: Supervision/Verbal cueing     Toileting Toileting    Toileting assist Assist for toileting: Contact Guard/Touching assist     Transfers Chair/bed transfer  Transfers assist     Chair/bed transfer assist level: Contact Guard/Touching assist     Locomotion Ambulation   Ambulation assist      Assist level: Contact Guard/Touching assist Assistive device: No Device Max distance: 150   Walk 10 feet activity   Assist     Assist level: Contact Guard/Touching assist Assistive device: No Device   Walk 50 feet activity   Assist    Assist level: Contact Guard/Touching assist Assistive device: No Device    Walk 150 feet activity   Assist    Assist level: Contact Guard/Touching assist Assistive device: No Device    Walk 10  feet on uneven surface  activity   Assist     Assist level: Moderate Assistance - Patient - 50 - 74% Assistive device: Other (comment) (no device)   Wheelchair     Assist Will patient use wheelchair at discharge?: No             Wheelchair 50 feet with 2 turns activity    Assist            Wheelchair 150 feet activity     Assist          Blood pressure (!) 121/52, pulse 72, temperature 98.3 F (36.8 C), temperature source Oral, resp. rate 18, height 6\' 1"  (1.854 m), weight 92.9 kg, SpO2 (!) 88 %.  Medical Problem List and Plan: 1.  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  Planning loop recorder.             -patient may  Shower after covered loop recorder- pt really wants hair washed  2/16- first day of therapies- pt ready.  2/18- walking with OT/RW- doing great  2/24- s/p R shoulder steroid injection 2/22              -ELOS/Goals: 16-18 days mod I  -Continue CIR 2.  Antithrombotics: -DVT/anticoagulation: SCDs             -antiplatelet therapy: Continue Aspirin 81 mg daily and Plavix 75 mg day x3 weeks and then Plavix alone 3. Pain Management: Continue Tylenol as needed  2/20- Continue tramadol 50 mg q6 hours prn for pain 4. Mood: Trazodone 50 mg nightly, melatonin 3 mg nightly  2/24- con't tramadol and tylenol- might need to increase tramadol dose?  2/25- will add Lidoderm patches to R scapula for pain control             -antipsychotic agents: N/A 5. Neuropsych: This patient is capable of making decisions on his own behalf. 6. Skin/Wound Care: Routine skin checks. Loop recorder soiled- dressing changed 7. Fluids/Electrolytes/Nutrition: Routine in and outs with follow-up chemistries 8.  Dysphagia.  Dysphagia #1 honey thick liquids.  Follow-up speech therapy. Will check Cr/BUN in Am- if significantly elevated, might need night IVFs for now Monitor hydration  2/16- BUN and Cr NOT elevated so will con't to monitor 2x/week and add IVFs if cannot keep up his hydration with honey thick liquids.   2/17 MBS today to see if can upgrade diet/liquids  2/18- is now on nectar thick liquids- has been upgraded   2/21- water protocol- as well- BUN?CR not elevated. - con't regimen  2/24- now D1 thin liquids per MBS  2/25- tolerating thin liquids well- con't regimen 9.  Hyperlipidemia.  Lipitor 10.  Hypothyroidism.  Continue Tapazole 11.  Tobacco abuse.  NicoDerm patch.  Provide counseling- smoked 2ppd- ok with Nicoderm  2/17- asked if could increase Patch- we cannot, but will add Gum if  possible 12.  BPH.  Flomax 0.4 mg daily.  Check PVR 13.  GERD.  Protonix 14.  CAD with history of stenting.  Continue Plavix and aspirin 15. Thrush? Will try Diflucan 200 mg x 7 days. Might require nystatin swish and spit  2/16- says mouth feels a little better- con't Diflucan  2/17 - will add Nystatin swish and spit, since cannot swallow it.   2/18- plaque is coming off tongue- 50% gone as of today  2/21- doing better- con't regimen  2/22- Plaque isn't reducing in size anymore- but mouth/tongue less erythematous-  con't regimen 16. Constipation- asked for Colace  2/21- will give Sorbitol x1 for constipation- con't regimen  2/22- LBM yesterday- con't regimen 17. Anxiety/depression  2/16- common in new stroke patients- will have pt seen by Neuropsych and start Paxil 10 mg QHS for both Sx's.  2/17- says worried might be "something else"- feels it in epigastric area? Doesn't sounds like GERD  2/18- will change to Celexa per pt request.  18. Muscle spasms  2/17- Will add Baclofen 5 mg QID for spasticity/muscle spasms  2/18- muscle spasms 50-75% better   2/25- muscle spasms in addition to muscle cramping of calves- spasticity spasms are resolved; no improvement in cramping- long/chronic issue- would suggest Magnesium and start some to help.  19. Xerostomia: lip balm ordered to help moisturize 20.Marland Kitchen Blurry vision  2/21- is B/L per pt- will see how it goes- not DM to cause blurry vision- con't monitor 21/ R shoulder chronic pain  2/22- R shoulder injection today  2/24- bothering him again- suggested oral meds- can take up to 3 days for steroid to kick in  2/25- pain over scapula- will start lidoderm patch 22. Urinary retention  2/22- will start Flomax, but also check U/A and Cx to make sure not brewing UTI.  23. Rash on back/LUE  2/22- will start Cortisone cream TID.  2/24- resolved- now on L anterior arm- has patch- asked nursing to use cream there  2/25- improving- con't regimen 24.  Dispo  2/16- will d/c IV for now- bothering pt.   2/18- will give B12 shot- is due and Tuesday will do R shoulder injection    Went over risks/benefits of R shoulder steroid injection- consent signed.  steroid injection was performed at R posterior RTC shoulder injection using 1% plain Lidocaine and 40mg  /1cc of Kenalog. This was well tolerated.  Cleaned with betadine x3 and allowed to dry- then alcohol then injected using 27 gauge 1.5 inch needle- no bleeding or complications.    F/U in 3 months for steroid injections of shoulder on R.   Lidocaine will kick in 15 minutes- and wear off tonight- the steroid will kick in tomorrow within 24 hours and take up to 72 hours to fully kick in.      LOS: 10 days A FACE TO FACE EVALUATION WAS PERFORMED  Megan Lovorn 03/05/2020, 4:33 PM

## 2020-03-05 NOTE — Consult Note (Signed)
Neuropsychological Consultation   Patient:   David Hendricks   DOB:   [birth year]  MR Number:  [id number]  Location:  [facility] [id number] [place] [address] Dept: [place]: [phone]           Date of Service:   03/05/2020  Start Time:   8 AM End Time:   9 AM  Provider/Observer:  Ilean Skill, Psy.D.       Clinical Neuropsychologist       Billing Code/Service: [phone]  Chief Complaint:    David Hendricks is a 70 year old right-handed male with a history of tobacco abuse, COPD, CAD with stenting, hyperlipidemia, hypothyroidism, BPH, anxiety with depression.  Patient had been seen for issues of depression and anxiety through some type of behavioral health organization.  Patient thought it was through: But there are no records of behavioral health care in his EMR although he described telemedicine visits for his depression prior to his recent stroke.  Patient has been started on citalopram 10 mg by Dr. Dagoberto Ligas here on the unit.  Patient also takes trazodone at night for sleep which is also been continued.  Patient presented on 02/15/2020 with left-sided weakness and slurred speech.  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  Patient has been describing sensory changes for his left hand and leg.  There did appear to be some small focus of susceptibility artifact in the region representing small thrombosed distal cortical MCA branch.  Possible additional puncate infarct in the right parietal convex versus infarct.  No other large vessel occlusion was noted.  Urine drug screen was negative.  Patient was admitted to the comprehensive rehab program due to ongoing left-sided weakness and slurred speech and reduction in functional capacity.  Reason for Service:  Patient was referred for neuropsychological consultation due to to coping and adjustment issues  with a history of depression anxiety and patient reports of increasing depressive symptomatology.  Below see HPI for the current admission.  HPI: David Hendricks is a 70 year old right-handed male with history of tobacco abuse, COPD, CAD with stenting maintained on aspirin, hyperlipidemia, hyperthyroidism, BPH anxiety with depression.  Per chart review patient lives alone.  Reportedly independent prior to admission.  1 level apartment.  Presented 02/15/2020 with left-sided weakness and slurred speech.  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  Small focus of susceptibility artifact in this region representing small thrombosed distal cortical MCA branch.  Possible additional punctate infarct in the right parietal cortex versus infarct.  No substantial edema or mass-effect.  CT angiogram of the head and neck nondominant right vertebral artery is diminutive throughout its course and likely occluded intradurally otherwise no large vessel occlusion or proximal hemodynamically significant stenosis.  Admission chemistries unremarkable aside glucose 104 SARS current virus negative hemoglobin 18.3, TSH 0.919.  Urine drug screen negative.  Echocardiogram with ejection fraction of 60 to 65% no wall motion abnormalities grade 1 diastolic dysfunction.  Currently maintained on aspirin and Plavix for CVA prophylaxis x3 weeks then Plavix alone.  Awaiting plan for loop recorder.  Dysphagia #1 honey thick liquid diet.  Therapy evaluations completed due to patient's left-sided weakness and slurred speech was admitted for a comprehensive rehab program.  Current Status:  The patient was in bed upon arrival and slightly elevated position.  Patient was alert and oriented.  Slurred speech was noted but  the patient appeared to have good expressive and receptive language beyond some motor changes affecting slurred speech.  Patient reported that he had been having a lot of stressors even  prior to his stroke and was having to have support financially through his brother and stepsister who now live in [place].  The patient has no more family living in the area and after their mother passed away the family farm was sold and his brother and stepsister moved to [place] and the patient is now in the area alone even though he grew up in the [place] area.  The patient describes a history of depression and that he had been having some telemedicine visits for counseling in the past but was not able to remember who it was or how the process was arranged.  Behavioral Observation: David Hendricks  presents as a 70 y.o.-year-old Right Caucasian Male who appeared his stated age. his dress was Appropriate and he was Well Groomed and his manners were Appropriate to the situation.  his participation was indicative of Appropriate and Redirectable behaviors.  There were physical disabilities noted.  he displayed an appropriate level of cooperation and motivation.     Interactions:    Active Appropriate and Redirectable  Attention:   abnormal and attention span appeared shorter than expected for age  Memory:   within normal limits; recent and remote memory intact  Visuo-spatial:  not examined  Speech (Volume):  normal  Speech:   normal; slurred  Thought Process:  Coherent and Relevant  Though Content:  WNL; not suicidal and not homicidal  Orientation:   person, place and time/date  Judgment:   Fair  Planning:   Fair  Affect:    Anxious and Depressed  Mood:    Dysphoric  Insight:   Good  Intelligence:   normal  Medical History:   Past Medical History:  Diagnosis Date  . Allergy   . Anxiety   . Aortic atherosclerosis ([place])   . Arthritis   . Benign prostatic hyperplasia (BPH) with urinary urgency   . Chronic kidney disease    KIDNEY STONES  . COPD (chronic obstructive pulmonary disease) ([place])    possible   . Coronary artery disease    10/18 PCI/DES to pLAD, mild  nonobstructive disease in the Lcx/RCA. Normal EF.   Marland Kitchen Depression   . Dyslipidemia   . Dysrhythmia 2016   irregular heartbeat  . Gallstones   . GERD (gastroesophageal reflux disease)   . History of kidney stones   . Hyperlipidemia   . Hyperthyroidism   . Leg cramps   . Multiple pulmonary nodules   . Myocardial infarction ([place])    2018  . Pneumonia   . Seasonal allergies   . Thyroid nodule   . Vertigo   . Vitamin B 12 deficiency          Patient Active Problem List   Diagnosis Date Noted  . 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 Dysphagia following cerebrovascular accident 02/17/2020  . Dysarthria due to recent cerebral infarction 02/17/2020  . Obesity 02/16/2020  . Hyperbilirubinemia 02/16/2020  . CVA (cerebral vascular accident) ([place]) 02/15/2020  . Thyroid mass 02/15/2020  . Anxiety and depression 02/15/2020  . Pulmonary emphysema ([place]) 06/02/2019  . Aortic atherosclerosis ([place]) 06/02/2019  . Smokers' cough ([place]) 06/02/2019  . 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  . Multinodular goiter 12/24/2018  . Subclinical hyperthyroidism 12/24/2018  . BPH (benign prostatic hyperplasia) 11/20/2018  . Pulmonary nodules 11/15/2018  .  Esophageal thickening 11/15/2018  . Cigarette nicotine dependence without complication 03/50/0938  . Old MI (myocardial infarction) 01/30/2017  . CAD (coronary artery disease) 01/30/2017  . Hyperlipidemia 10/10/2016  . History of non-ST elevation myocardial infarction (NSTEMI) 10/09/2016  . Tobacco abuse 08/28/2016  . Difficulty sleeping 04/22/2016  . Benign prostatic hyperplasia with urinary frequency 02/11/2016     Psychiatric History:  Patient has a history of depression and anxiety with prior psychiatric/psychological care of unknown provider.  Patient has been started on citalopram recently.  Family Med/Psych History:  Family History  Problem Relation Age of Onset  . Alcohol abuse Father        called [facility] per  pt  . Cancer Father        OAK CELL CANCER PER PT NON SURGICAL   . Colon cancer Neg Hx   . Colon polyps Neg Hx   . Esophageal cancer Neg Hx   . Rectal cancer Neg Hx   . Stomach cancer Neg Hx    Impression/DX:  David Hendricks is a 70 year old right-handed male with a history of tobacco abuse, COPD, CAD with stenting, hyperlipidemia, hypothyroidism, BPH, anxiety with depression.  Patient had been seen for issues of depression and anxiety through some type of behavioral health organization.  Patient thought it was through: But there are no records of behavioral health care in his EMR although he described telemedicine visits for his depression prior to his recent stroke.  Patient has been started on citalopram 10 mg by Dr. Dagoberto Ligas here on the unit.  Patient also takes trazodone at night for sleep which is also been continued.  Patient presented on 02/15/2020 with left-sided weakness and slurred speech.  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  Patient has been describing sensory changes for his left hand and leg.  There did appear to be some small focus of susceptibility artifact in the region representing small thrombosed distal cortical MCA branch.  Possible additional puncate infarct in the right parietal convex versus infarct.  No other large vessel occlusion was noted.  Urine drug screen was negative.  Patient was admitted to the comprehensive rehab program due to ongoing left-sided weakness and slurred speech and reduction in functional capacity.  The patient was in bed upon arrival and slightly elevated position.  Patient was alert and oriented.  Slurred speech was noted but the patient appeared to have good expressive and receptive language beyond some motor changes affecting slurred speech.  Patient reported that he had been having a lot of stressors even prior to his stroke and was having to have support financially through his brother and stepsister  who now live in [place].  The patient has no more family living in the area and after their mother passed away the family farm was sold and his brother and stepsister moved to [place] and the patient is now in the area alone even though he grew up in the [place] area.  The patient describes a history of depression and that he had been having some telemedicine visits for counseling in the past but was not able to remember who it was or how the process was arranged.  Disposition/Plan:  Today we worked on coping and adjustment issues and addressed issues related to his depressive symptomatology.  Patient denied any side effects relative to the citalopram although he was not fully aware or understanding that he was already taking an antidepressant medication and we will  need to address his understanding of his various rate medication regimen going forward.  I do think that helping the patient understand his various medications and having a clear understanding of when he takes will be important prior to discharge.  Diagnosis:    Cerebrovascular accident (CVA) due to thrombosis of precerebral artery [facility]) - Plan: Ambulatory referral to Neurology         Electronically Signed   _______________________ Ilean Skill, Psy.D. Clinical Neuropsychologist

## 2020-03-05 NOTE — Progress Notes (Signed)
Occupational Therapy Session Note  Patient Details  Name: David Hendricks MRN: [id number] Date of Birth: 1950/07/19  Today's Date: 03/05/2020 OT Individual Time: 0254-2706 OT Individual Time Calculation (min): 56 min    Short Term Goals: Week 2:  OT Short Term Goal 1 (Week 2): STG=LTG 2/2 ELOS  Skilled Therapeutic Interventions/Progress Updates:    Pt greeted in bed, premedicated for shoulder pain. He requested to shower. Setup for donning gripper socks before ambulating without AD to the TTB, CGA-close supervision for dynamic standing balance. Pt then bathed while seated using lateral leans. Min vcs for sequencing of tasks, vcs for thoroughness when completing perihygiene. He then ambulated to the w/c to complete shaving while seated. Pt using his affected Lt hand to manipulate the safety razor. Pt did not want to put clothes on. Therefore session focus was placed on dynamic standing balance in the context of IADL retraining, pt stripping linen from bed and applying clean linen to bed, ambulating without device around bed to meet tasks demands. He required supervision overall except for 2 LOBs when he accidentally tripped over the gripper socks he was wearing, CGA for balance recovery at these times. Increased time and difficultly when donning 3 pillowcases onto pillows due to decreased [place] in the Lt hand. Pt then returned to bed with CGA, bed elevated to height at home with bedrails lowered. Pt remained in bed at close of session, all needs within reach and bed alarm set.     Therapy Documentation Precautions:  Precautions Precautions: Fall Precaution Comments: Impulsive Restrictions Weight Bearing Restrictions: No Vital Signs: Therapy Vitals Temp: 98.3 F (36.8 C) Temp Source: Oral Pulse Rate: 72 Resp: 18 BP: (!) 121/52 Patient Position (if appropriate): Lying Oxygen Therapy SpO2: (!) 88 % O2 Device: Room Air ADL: ADL Eating: Not assessed Grooming: Setup Upper Body Bathing:  Supervision/safety Where Assessed-Upper Body Bathing: Shower Lower Body Bathing: Contact guard Where Assessed-Lower Body Bathing: Shower Upper Body Dressing: Supervision/safety Lower Body Dressing: Minimal assistance Toileting: Contact guard Toilet Transfer: Retail banker Transfer: Contact guard      Therapy/Group: Individual Therapy  Floetta Brickey A Jameson Morrow 03/05/2020, 3:53 PM

## 2020-03-05 NOTE — Progress Notes (Signed)
Speech Language Pathology Daily Session Note  Patient Details  Name: Carlester Kasparek MRN: [id number] Date of Birth: [birth year]  Today's Date: 03/05/2020 SLP Individual Time: 1105-1203 SLP Individual Time Calculation (min): 58 min  Short Term Goals: Week 2: SLP Short Term Goal 1 (Week 2): STG=LTG due to short ELOS (planned d/c 3/1)  Skilled Therapeutic Interventions:Skilled ST services focused on swallow and cognitive skills. SLP provided education of yesterday's MBS reviewing X-Ray slides with pt. Pt demonstrated recall of clear throat and dry swallow intermittently to reduce pharyngeal residue with supervision A verbal cues. Pt consumed dsy 1, dys 2 and dys 3 texture snacks, requiring supervision A verbal cues fade to mod I for use of swallow strategies (small bites/sips, follow solids with liquids and throat clear and dry swallow.) SLP continues to recommend dys 1 textures due to the residue not being cleared on the MBS. SLP facilitated medication management with labeled pill bottles to allow pt to replicate desired management system of arranging bottles versus pill organizer. Pt demonstrated appropriate verbal and functional problem solving with supervision A verbal cues fade to mod I. SLP expressed concern with pt recalling to consume and if has consumed medication that is multiple times a day and attempted to set up alarm system on phone but it was limited with no ability to add written message. SLP recommends a written check off system for pt, pt agreed. Pt was left with bed alarm set and call bell within reach. Recommend to continue skilled ST services.     Pain Pain Assessment Pain Scale: 0-10 Pain Score: 0-No pain Pain Location: Shoulder Pain Orientation: Right Pain Intervention(s): Repositioned  Therapy/Group: Individual Therapy  Noelia Lenart  [facility] 03/05/2020, 12:06 PM

## 2020-03-05 NOTE — Progress Notes (Signed)
Physical Therapy Session Note  Patient Details  Name: David Hendricks MRN: [id number] Date of Birth: [birth year]  Today's Date: 03/05/2020 PT Individual Time: 0905-1001 PT Individual Time Calculation (min): 56 min   Short Term Goals: Week 2:  PT Short Term Goal 1 (Week 2): =LTG due to ELOS  Skilled Therapeutic Interventions/Progress Updates:   Pt received supine in bed and agreeable to PT. Supine>sit transfer with supervision assist and cues for attention to the LUE. Pt able to don shirt sitting EOB with set up assist.   Sit<>stand transfers with supervision assist overall with one instance of CGA-min A due to mild R LOB initially but then able to correct.   Gait training with no AD through hall 2x 134f and supervision assist for safety.   Dynamic gait training to weave through 4 cones x 4 then side step/weave through 8 cones x 2 forward and x 2 forward/reverse. Stepping over 7 cones x 4 with CGA from PT for safety. Pt with difficulty clearing LLE initially but able to correct   dynamic balance training while engaged in gross motor coordination  Activity of wii bowling. Pt able to tolerate 7 frames in standing prior to requesting seated rest break. Pt then engaged dynamic balance training while performing fine motor task of foam block construction. Min assist for piece stabilization and cues to reference example picture.   Pt returned to room and performed ambulatory transfer to bed with supervision assist. Sit>supine completed without assist, and left supine in bed with call bell in reach and all needs met.              Therapy Documentation Precautions:  Precautions Precautions: Fall Precaution Comments: Impulsive Restrictions Weight Bearing Restrictions: No   Pain: Pain Assessment Pain Scale: 0-10 Pain Score: 8  Pain Location: Shoulder Pain Orientation: Right Pain Intervention(s): Medication (See eMAR)    Therapy/Group: Individual Therapy  ALorie Phenix2/25/2022,  10:05 AM

## 2020-03-06 MED ORDER — TAMSULOSIN HCL 0.4 MG PO CAPS
0.8000 mg | ORAL_CAPSULE | Freq: Every day | ORAL | Status: DC
Start: 2020-03-06 — End: 2020-03-09
  Administered 2020-03-06 – 2020-03-08 (×3): 0.8 mg via ORAL
  Filled 2020-03-06 (×3): qty 2

## 2020-03-06 NOTE — Progress Notes (Signed)
PROGRESS NOTE   Subjective/Complaints:  Pt had previously said wasn't needing cathing much anymore- but told me today, is requiring it "A lot". Will change Flomax to 0.8 mg QHS on 0.4 mg BID right now.  Explained might need to go home with Foley and f/u with Urology.   Just got patch for R scapula pain- has helped a little- but had to be explained why shoulder injection didn't help.   Also wants to cut toenails- don't have a way to do so right now.   ROS:  Pt denies SOB, abd pain, CP, N/V/C/D, and vision changes   Objective:   No results found. No results for input(s): WBC, HGB, HCT, PLT in the last 72 hours. No results for input(s): NA, K, CL, CO2, GLUCOSE, BUN, CREATININE, CALCIUM in the last 72 hours.  Intake/Output Summary (Last 24 hours) at 03/06/2020 1657 Last data filed at 03/06/2020 1353 Gross per 24 hour  Intake 290 ml  Output 1995 ml  Net -1705 ml        Physical Exam: Vital Signs Blood pressure 115/68, pulse 71, temperature 98.1 F (36.7 C), resp. rate 18, height 6\' 1"  (1.854 m), weight 92.9 kg, SpO2 (!) 88 %.   General: awake, alert, appropriate, NAD- sitting in bedside chair HENT: conjugate gaze; oropharynx moist CV: regular rate; no JVD Pulmonary: CTA B/L; no W/R/R- good air movement GI: soft, NT, ND, (+)BS Psychiatric: appropriate, but irritated/unhappy about cathing Neurological: Ox3  Skin: back rash resolved- L anterior shoulder rash improved- almost gone/resolved Musloskeletal: TTP over infraspinatus and scapula and supraspinatus. Has lidoderm patch in place x 15 minutes    Cervical back: Normal range of motion and neck supple. TTP over R RTC/shoulder    Comments: RUE 5/5 in biceps, triceps, WE, grip and finger abd LUE- 5-/5 except finger abd 4+/5 RLE- 5/5 in JF, KE, DF and PF LLE- 5-/5 in same muscles Neurological: Ox3- Hoffman's on LUE   Speech is mildly dysarthric but intelligible-  mildly better today- can understand better Ox3- blurry vision, "even with eyeglasses".  Light touch intact x 4 extremities       Assessment/Plan: 1. Functional deficits which require 3+ hours per day of interdisciplinary therapy in a comprehensive inpatient rehab setting.  Physiatrist is providing close team supervision and 24 hour management of active medical problems listed below.  Physiatrist and rehab team continue to assess barriers to discharge/monitor patient progress toward functional and medical goals  Care Tool:  Bathing    Body parts bathed by patient: Right arm,Left arm,Chest,Abdomen,Front perineal area,Buttocks,Right upper leg,Left upper leg,Right lower leg,Left lower leg,Face         Bathing assist Assist Level: Supervision/Verbal cueing     Upper Body Dressing/Undressing Upper body dressing   What is the patient wearing?: Pull over shirt    Upper body assist Assist Level: Set up assist    Lower Body Dressing/Undressing Lower body dressing      What is the patient wearing?: Pants     Lower body assist Assist for lower body dressing: Supervision/Verbal cueing     Toileting Toileting    Toileting assist Assist for toileting: Contact Guard/Touching assist  Transfers Chair/bed transfer  Transfers assist     Chair/bed transfer assist level: Contact Guard/Touching assist     Locomotion Ambulation   Ambulation assist      Assist level: Contact Guard/Touching assist Assistive device: No Device Max distance: 150   Walk 10 feet activity   Assist     Assist level: Contact Guard/Touching assist Assistive device: No Device   Walk 50 feet activity   Assist    Assist level: Contact Guard/Touching assist Assistive device: No Device    Walk 150 feet activity   Assist    Assist level: Contact Guard/Touching assist Assistive device: No Device    Walk 10 feet on uneven surface  activity   Assist     Assist level:  Moderate Assistance - Patient - 50 - 74% Assistive device: Other (comment) (no device)   Wheelchair     Assist Will patient use wheelchair at discharge?: No             Wheelchair 50 feet with 2 turns activity    Assist            Wheelchair 150 feet activity     Assist          Blood pressure 115/68, pulse 71, temperature 98.1 F (36.7 C), resp. rate 18, height 6\' 1"  (1.854 m), weight 92.9 kg, SpO2 (!) 88 %.  Medical Problem List and Plan: 1.  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  Planning loop recorder.             -patient may  Shower after covered loop recorder- pt really wants hair washed  2/16- first day of therapies- pt ready.  2/18- walking with OT/RW- doing great  2/24- s/p R shoulder steroid injection 2/22              -ELOS/Goals: 16-18 days mod I  -Continue CIR 2.  Antithrombotics: -DVT/anticoagulation: SCDs             -antiplatelet therapy: Continue Aspirin 81 mg daily and Plavix 75 mg day x3 weeks and then Plavix alone 3. Pain Management: Continue Tylenol as needed  2/20- Continue tramadol 50 mg q6 hours prn for pain 4. Mood: Trazodone 50 mg nightly, melatonin 3 mg nightly  2/24- con't tramadol and tylenol- might need to increase tramadol dose?  2/25- will add Lidoderm patches to R scapula for pain control  2/26- not sure yet if Lidoderm has helped- just got the patches. Will monitor             -antipsychotic agents: N/A 5. Neuropsych: This patient is capable of making decisions on his own behalf. 6. Skin/Wound Care: Routine skin checks. Loop recorder soiled- dressing changed  2/26- looks great now- doesn't need dressing- a little bruised 7. Fluids/Electrolytes/Nutrition: Routine in and outs with follow-up chemistries 8.  Dysphagia.  Dysphagia #1 honey thick liquids.  Follow-up speech therapy. Will check Cr/BUN in Am- if significantly elevated, might need night IVFs for now Monitor  hydration  2/16- BUN and Cr NOT elevated so will con't to monitor 2x/week and add IVFs if cannot keep up his hydration with honey thick liquids.   2/17 MBS today to see if can upgrade diet/liquids  2/18- is now on nectar thick liquids- has been upgraded   2/21- water protocol- as well- BUN?CR not elevated. - con't regimen  2/24- now D1 thin liquids per MBS  2/25- tolerating thin liquids well- con't regimen 9.  Hyperlipidemia.  Lipitor 10.  Hypothyroidism.  Continue Tapazole 11.  Tobacco abuse.  NicoDerm patch.  Provide counseling- smoked 2ppd- ok with Nicoderm  2/17- asked if could increase Patch- we cannot, but will add Gum if possible 12.  BPH.  Flomax 0.4 mg daily.  Check PVR 13.  GERD.  Protonix 14.  CAD with history of stenting.  Continue Plavix and aspirin 15. Thrush? Will try Diflucan 200 mg x 7 days. Might require nystatin swish and spit  2/16- says mouth feels a little better- con't Diflucan  2/17 - will add Nystatin swish and spit, since cannot swallow it.   2/18- plaque is coming off tongue- 50% gone as of today  2/21- doing better- con't regimen  2/22- Plaque isn't reducing in size anymore- but mouth/tongue less erythematous- con't regimen 16. Constipation- asked for Colace  2/21- will give Sorbitol x1 for constipation- con't regimen  2/22- LBM yesterday- con't regimen 17. Anxiety/depression  2/16- common in new stroke patients- will have pt seen by Neuropsych and start Paxil 10 mg QHS for both Sx's.  2/17- says worried might be "something else"- feels it in epigastric area? Doesn't sounds like GERD  2/18- will change to Celexa per pt request.  18. Muscle spasms  2/17- Will add Baclofen 5 mg QID for spasticity/muscle spasms  2/18- muscle spasms 50-75% better   2/25- muscle spasms in addition to muscle cramping of calves- spasticity spasms are resolved; no improvement in cramping- long/chronic issue- would suggest Magnesium and start some to help.  19. Xerostomia: lip balm  ordered to help moisturize 20.Marland Kitchen Blurry vision  2/21- is B/L per pt- will see how it goes- not DM to cause blurry vision- con't monitor 21/ R shoulder chronic pain  2/22- R shoulder injection today  2/24- bothering him again- suggested oral meds- can take up to 3 days for steroid to kick in  2/25- pain over scapula- will start lidoderm patch 22. Urinary retention  2/22- will start Flomax, but also check U/A and Cx to make sure not brewing UTI.  2/26- Will change Flomax to 0.8 mg nightly- might require a foley at d/c and sent to Dr Matilde Sprang, the neuro Urologist in town  23. Rash on back/LUE  2/22- will start Cortisone cream TID.  2/24- resolved- now on L anterior arm- has patch- asked nursing to use cream there  2/25- improving- con't regimen 24. Dispo  2/16- will d/c IV for now- bothering pt.   2/18- will give B12 shot- is due and Tuesday will do R shoulder injection  2/26- d/c 3/1 planned- pt wants to stay longer 'until can pee"- I explained that we don't know if he WILL be able to void, but cannot keep "until he does".     Went over risks/benefits of R shoulder steroid injection- consent signed.  steroid injection was performed at R posterior RTC shoulder injection using 1% plain Lidocaine and 40mg  /1cc of Kenalog. This was well tolerated.  Cleaned with betadine x3 and allowed to dry- then alcohol then injected using 27 gauge 1.5 inch needle- no bleeding or complications.    F/U in 3 months for steroid injections of shoulder on R.   Lidocaine will kick in 15 minutes- and wear off tonight- the steroid will kick in tomorrow within 24 hours and take up to 72 hours to fully kick in.      LOS: 11 days A FACE TO FACE EVALUATION WAS PERFORMED  Jaeleigh Monaco 03/06/2020, 4:57 PM

## 2020-03-06 NOTE — Progress Notes (Signed)
Speech Language Pathology Daily Session Note  Patient Details  Name: David Hendricks MRN: [id number] Date of Birth: [birth year]  Today's Date: 03/06/2020 SLP Individual Time: 1105-1145 SLP Individual Time Calculation (min): 40 min  Short Term Goals: Week 2: SLP Short Term Goal 1 (Week 2): STG=LTG due to short ELOS (planned d/c 3/1)  Skilled Therapeutic Interventions: Pt seen for skilled ST with focus on dysphagia and cognitive goals. Pt quite agitated d/t recent difficulty voiding and a death in the family. Pt agreeable to tx session. Pt completing CTAR x10 requiring mod verbal cues to increase effort and accuracy.  Dys 3 snack provided and pt requiring consistent verbal cues throughout to utilize swallow precautions. Pt observed taking large bites, attempting more intake when mouth still very full from previous bite and decreased frequency of alternating liquids with solids. Pt reluctantly agreeable to utilize cued strategies throughout, one large cough during intake with no change in vocal quality noted. SLP facilitating ongoing medication management task by providing min A verbal cues to increase awareness and problem solving related to new meds, new med schedule and new med intake method (pt preferring from the bottle with checklist instead of pill box). Pt initially receptive to task and education however becoming very agitated with request to "move on". Pt would benefit from ongoing education and training for new medication regime during following ST tx sessions when pt more agreeable.  Pt left in bed with alarm set and all needs within reach, cont ST POC.   Pain Pain Assessment Pain Scale: 0-10 Pain Score: 0-No pain  Therapy/Group: Individual Therapy  David Hendricks 03/06/2020, 11:29 AM

## 2020-03-06 NOTE — Progress Notes (Addendum)
Physical Therapy Session Note  Patient Details  Name: David Hendricks MRN: [id number] Date of Birth: Jun 10, 1950  Today's Date: 03/06/2020 PT Individual Time: 0903-0958 PT Individual Time Calculation (min): 55 min   Short Term Goals: Week 1:  PT Short Term Goal 1 (Week 1): Pt will complete least restrictive transfer at Supervision level PT Short Term Goal 1 - Progress (Week 1): Progressing toward goal PT Short Term Goal 2 (Week 1): Pt will ambulate x 150 ft with LRAD and Supervision PT Short Term Goal 2 - Progress (Week 1): Progressing toward goal PT Short Term Goal 3 (Week 1): Pt will complete objective balance measure to determine fall risk PT Short Term Goal 3 - Progress (Week 1): Met  Skilled Therapeutic Interventions/Progress Updates:    pt received in bed and agreeable to therapy, nursing present for medications. Pt then directed in in donning clothing in bed, setup A for this. Pt directed in supine>sit supervision, Sit to stand supervision no AD and gait training without AD supervision for 150' to day room. Pt then directed in NMRE for dynamic standing balance with no AD for toe taps on 4" step stool alternating CGA 2x20, forward and backward walking 25' x5 with VC for increased stride length with backward walking. Pt then directed in gait to ortho gym 350' supervision no AD with VC for increased stride length and trunk extension. Pt directed in car transfer from low height and higher height to insure various car heights for DC, supervision. Pt then directed in ascending/descending ramp one handrail supervision, uneven surface 10' supervision. Pt then returned to room, 400' supervision. Pt directed in returning to bed at pt request, supervision. Pt left in bed, alarm set, All needs in reach and in good condition. Call light in hand.    Therapy Documentation Precautions:  Precautions Precautions: Fall Precaution Comments: Impulsive Restrictions Weight Bearing Restrictions: No General:    Vital Signs:   Pain: Pain Assessment Pain Scale: 0-10 Pain Score: 0-No pain Mobility:   Locomotion :    Trunk/Postural Assessment :    Balance:   Exercises:   Other Treatments:      Therapy/Group: Individual Therapy  Junie Panning 03/06/2020, 11:00 AM

## 2020-03-07 NOTE — Discharge Summary (Signed)
Physician Discharge Summary  Patient ID: David Hendricks MRN: [id number] DOB/AGE: 1950/08/08 70 y.o.  Admit date: 02/24/2020 Discharge date: 03/09/2020  Discharge Diagnoses:  Principal Problem:   Right middle cerebral artery stroke [facility]) Pain management Mood stabilization Dysphagia Hyperlipidemia Hyperthyroidism Tobacco abuse BPH GERD CAD with stenting Thrush Constipation Anxiety/depression  Discharged Condition: Stable  Significant Diagnostic Studies: MR BRAIN WO CONTRAST  Result Date: 02/15/2020 CLINICAL DATA:  Stroke follow-up. EXAM: MRI HEAD WITHOUT CONTRAST TECHNIQUE: Multiplanar, multiecho pulse sequences of the brain and surrounding structures were obtained without intravenous contrast. COMPARISON:  None. FINDINGS: Brain: Acute cortical and subcortical infarct in the high right posterior frontal lobe no substantial edema or mass effect. Small focus of susceptibility artifact in this region may represent a small thrombosed distal cortical MCA branch (see series 18, image 40). There is an additional possible punctate focus of restricted diffusion in the right parietal cortex (see series 9, image 93 and series 10, image 39). Moderate to advanced additional scattered T2/FLAIR hyperintensities within the white matter, most likely related to chronic microvascular ischemic disease. No hydrocephalus. No acute hemorrhage. No extra-axial fluid collection. No abnormal mass lesion or abnormal mass effect. No midline shift. Basal cisterns are patent. Additional small focus of susceptibility artifact in the right parietal lobe may represent the sequela of prior microhemorrhage. Vascular: Major arterial flow voids are maintained at the skull base. Skull and upper cervical spine: Normal marrow signal. Sinuses/Orbits: Largely clear. Other: No mastoid effusions. IMPRESSION: 1. Acute cortical and subcortical infarct in the high right posterior frontal lobe. Small focus of susceptibility artifact in this  region may represent a small thrombosed distal cortical MCA branch. 2. Possible additional punctate infarct in the right parietal cortex versus artifact. 3. No substantial edema or mass effect. 4. Moderate to advanced chronic microvascular ischemic disease. These results will be called to the ordering clinician or representative by the Radiologist Assistant, and communication documented in the PACS or [employer]. Electronically Signed   By: Margaretha Sheffield MD   On: 02/15/2020 18:44   DG CHEST PORT 1 VIEW  Result Date: 02/23/2020 CLINICAL DATA:  Hypoxia. EXAM: PORTABLE CHEST 1 VIEW COMPARISON:  02/15/2020 and CT chest 11/15/2018. FINDINGS: Trachea is midline. Heart size normal. Thoracic aorta is calcified. Loop recorder is in place. Lungs are clear. No pleural fluid. IMPRESSION: No acute findings. Electronically Signed   By: Lorin Picket M.D.   On: 02/23/2020 09:24   DG Chest Port 1 View  Result Date: 02/15/2020 CLINICAL DATA:  Cough. EXAM: PORTABLE CHEST 1 VIEW COMPARISON:  April 23, 2019. FINDINGS: The heart size and mediastinal contours are within normal limits. Both lungs are clear. The visualized skeletal structures are unremarkable. IMPRESSION: No active disease. Electronically Signed   By: Marijo Conception M.D.   On: 02/15/2020 15:43   DG Swallowing Func-Speech Pathology  Result Date: 03/04/2020 Objective Swallowing Evaluation: Type of Study: MBS-Modified Barium Swallow Study  Patient Details Name: David Hendricks MRN: [id number] Date of Birth: [birth year] Today's Date: 03/04/2020 Time: SLP Start Time (ACUTE ONLY): 6433 -SLP Stop Time (ACUTE ONLY): 1534 SLP Time Calculation (min) (ACUTE ONLY): 20 min Past Medical History: Past Medical History: Diagnosis Date . Allergy  . Anxiety  . Aortic atherosclerosis ([place])  . Arthritis  . Benign prostatic hyperplasia (BPH) with urinary urgency  . Chronic kidney disease   KIDNEY STONES . COPD (chronic obstructive pulmonary disease) ([place])   possible  . Coronary  artery disease   10/18 PCI/DES to pLAD, mild nonobstructive disease  in the Lcx/RCA. Normal EF.  Marland Kitchen Depression  . Dyslipidemia  . Dysrhythmia 2016  irregular heartbeat . Gallstones  . GERD (gastroesophageal reflux disease)  . History of kidney stones  . Hyperlipidemia  . Hyperthyroidism  . Leg cramps  . Multiple pulmonary nodules  . Myocardial infarction ([place])   2018 . Pneumonia  . Seasonal allergies  . Thyroid nodule  . Vertigo  . Vitamin B 12 deficiency  Past Surgical History: Past Surgical History: Procedure Laterality Date . COLONOSCOPY   . CORONARY ANGIOPLASTY  10/09/2016 . CORONARY STENT INTERVENTION N/A 10/09/2016  Procedure: CORONARY STENT INTERVENTION;  Surgeon: Nelva Bush, MD;  Location: [place] [facility];  Service: Cardiovascular;  Laterality: N/A; . CYSTOSCOPY WITH RETROGRADE PYELOGRAM, URETEROSCOPY AND STENT PLACEMENT Left 06/17/2019  Procedure: CYSTOSCOPY WITH RETROGRADE PYELOGRAM, URETEROSCOPY AND STENT PLACEMENT;  Surgeon: Robley Fries, MD;  Location: [facility];  Service: Urology;  Laterality: Left;  1 HR . HOLMIUM LASER APPLICATION Left 09/15/6732  Procedure: HOLMIUM LASER APPLICATION;  Surgeon: Robley Fries, MD;  Location[facility];  Service: Urology;  Laterality: Left; . INTRAVASCULAR ULTRASOUND/IVUS N/A 10/09/2016  Procedure: Intravascular Ultrasound/IVUS;  Surgeon: Nelva Bush, MD;  Location: [place] [facility];  Service: Cardiovascular;  Laterality: N/A; . IR CHOLANGIOGRAM EXISTING TUBE  11/18/2018 . IR PERC CHOLECYSTOSTOMY  11/16/2018 . LEFT HEART CATH AND CORONARY ANGIOGRAPHY N/A 10/09/2016  Procedure: LEFT HEART CATH AND CORONARY ANGIOGRAPHY;  Surgeon: Nelva Bush, MD;  Location: [place] [facility];  Service: Cardiovascular;  Laterality: N/A; . LOOP RECORDER INSERTION N/A 02/20/2020  Procedure: LOOP RECORDER INSERTION;  Surgeon: Vickie Epley, MD;  Location: [place] [facility];  Service: Cardiovascular;  Laterality: N/A; HPI: Pt is a 70 y/o male admitted secondary to slurred  speech and L sided weakness. 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 PMH includes tobacco use, CAD, COPD, anxiety, and depression.  Subjective: alert, cooperative, dysarthric Assessment / Plan / Recommendation CHL IP CLINICAL IMPRESSIONS 03/04/2020 Clinical Impression Patient presents with some improvement in swallow function as compared to most recent MBS on 2/17. He did exhibit swallow initiation delays to level of vallcular sinus with thin liquids however this was to level of pyriform sinus during previous study. He exhibited trace penetration and trace flash penetration with thin liquids which occured two times during swallow. No aspiration observed before, during or after the swallow. Patient does continue to have min-moderate amount of vallecular and pyriform sinus residuals with thin liquids, puree solids, regular solids and dysphagia 3 solids, requiring multiple dry swallows to start to clear. (Full clearance of residuals was never achieved.) During consumption of mechanical soft and regular solids, patient commented that he "cant swallow it" and did demonstrate delay in swallow initiatoin and pharyngeal transit of solids boluses. Although patient did not exhibit any aspiraiton of pharyngeal residuals, he will remain at risk of aspiration. SLP recommending upgrade liquids to thin (from nectar thick) and continue with Dys 1 solids. SLP Visit Diagnosis Dysphagia, oropharyngeal phase (R13.12) Attention and concentration deficit following -- Frontal lobe and executive function deficit following -- Impact on safety and function Mild aspiration risk   CHL IP TREATMENT RECOMMENDATION 03/04/2020 Treatment Recommendations Therapy as outlined in treatment plan below   Prognosis 02/18/2020 Prognosis for Safe Diet Advancement Good Barriers to Reach Goals Cognitive deficits;Severity of deficits Barriers/Prognosis Comment -- CHL IP DIET RECOMMENDATION 03/04/2020 SLP Diet Recommendations Dysphagia 1 (Puree) solids;Thin  liquid Liquid Administration via Cup;Straw Medication Administration Crushed with puree Compensations Minimize environmental distractions;Slow  rate;Small sips/bites;Follow solids with liquid;Clear throat intermittently Postural Changes Seated upright at 90 degrees;Remain semi-upright after after feeds/meals (Comment)   CHL IP OTHER RECOMMENDATIONS 03/04/2020 Recommended Consults -- Oral Care Recommendations Oral care BID Other Recommendations --   CHL IP FOLLOW UP RECOMMENDATIONS 02/22/2020 Follow up Recommendations Inpatient Rehab   CHL IP FREQUENCY AND DURATION 02/18/2020 Speech Therapy Frequency (ACUTE ONLY) min 2x/week Treatment Duration 2 weeks      CHL IP ORAL PHASE 03/04/2020 Oral Phase Impaired Oral - Pudding Teaspoon -- Oral - Pudding Cup -- Oral - Honey Teaspoon -- Oral - Honey Cup NT Oral - Nectar Teaspoon NT Oral - Nectar Cup NT Oral - Nectar Straw -- Oral - Thin Teaspoon -- Oral - Thin Cup WFL Oral - Thin Straw WFL Oral - Puree Delayed oral transit Oral - Mech Soft -- Oral - Regular Reduced posterior propulsion;Impaired mastication Oral - Multi-Consistency -- Oral - Pill WFL Oral Phase - Comment --  CHL IP PHARYNGEAL PHASE 03/04/2020 Pharyngeal Phase Impaired Pharyngeal- Pudding Teaspoon -- Pharyngeal -- Pharyngeal- Pudding Cup -- Pharyngeal -- Pharyngeal- Honey Teaspoon -- Pharyngeal -- Pharyngeal- Honey Cup NT Pharyngeal -- Pharyngeal- Nectar Teaspoon NT Pharyngeal -- Pharyngeal- Nectar Cup NT Pharyngeal -- Pharyngeal- Nectar Straw -- Pharyngeal -- Pharyngeal- Thin Teaspoon NT Pharyngeal -- Pharyngeal- Thin Cup Delayed swallow initiation-vallecula;Penetration/Aspiration during swallow;Pharyngeal residue - pyriform;Pharyngeal residue - valleculae Pharyngeal Material enters airway, remains ABOVE vocal cords then ejected out Pharyngeal- Thin Straw Delayed swallow initiation-vallecula;Pharyngeal residue - valleculae;Pharyngeal residue - pyriform;Penetration/Aspiration during swallow Pharyngeal Material enters  airway, remains ABOVE vocal cords then ejected out Pharyngeal- Puree Pharyngeal residue - valleculae;Pharyngeal residue - pyriform Pharyngeal Material does not enter airway Pharyngeal- Mechanical Soft Pharyngeal residue - pyriform;Pharyngeal residue - valleculae;Reduced pharyngeal peristalsis Pharyngeal Material does not enter airway Pharyngeal- Regular Reduced pharyngeal peristalsis;Pharyngeal residue - valleculae;Pharyngeal residue - pyriform Pharyngeal Material does not enter airway Pharyngeal- Multi-consistency -- Pharyngeal -- Pharyngeal- Pill WFL Pharyngeal Material does not enter airway Pharyngeal Comment --  CHL IP CERVICAL ESOPHAGEAL PHASE 03/04/2020 Cervical Esophageal Phase WFL Pudding Teaspoon -- Pudding Cup -- Honey Teaspoon -- Honey Cup -- Nectar Teaspoon -- Nectar Cup -- Nectar Straw -- Thin Teaspoon -- Thin Cup -- Thin Straw -- Puree -- Mechanical Soft -- Regular -- Multi-consistency -- Pill -- Cervical Esophageal Comment -- Sonia Baller, MA, CCC-SLP Speech Therapy             DG Swallowing Func-Speech Pathology  Result Date: 02/26/2020 Objective Swallowing Evaluation: Type of Study: MBS-Modified Barium Swallow Study  Patient Details Name: David Hendricks MRN: [id number] Date of Birth: [birth year] Today's Date: 02/26/2020 Time: SLP Start Time (ACUTE ONLY): 1514 -SLP Stop Time (ACUTE ONLY): 1534 SLP Time Calculation (min) (ACUTE ONLY): 20 min Past Medical History: Past Medical History: Diagnosis Date . Allergy  . Anxiety  . Aortic atherosclerosis ([place])  . Arthritis  . Benign prostatic hyperplasia (BPH) with urinary urgency  . Chronic kidney disease   KIDNEY STONES . COPD (chronic obstructive pulmonary disease) ([place])   possible  . Coronary artery disease   10/18 PCI/DES to pLAD, mild nonobstructive disease in the Lcx/RCA. Normal EF.  Marland Kitchen Depression  . Dyslipidemia  . Dysrhythmia 2016  irregular heartbeat . Gallstones  . GERD (gastroesophageal reflux disease)  . History of kidney stones  . Hyperlipidemia  .  Hyperthyroidism  . Leg cramps  . Multiple pulmonary nodules  . Myocardial infarction ([place])   2018 . Pneumonia  . Seasonal allergies  . Thyroid nodule  . Vertigo  .  Vitamin B 12 deficiency  Past Surgical History: Past Surgical History: Procedure Laterality Date . COLONOSCOPY   . CORONARY ANGIOPLASTY  10/09/2016 . CORONARY STENT INTERVENTION N/A 10/09/2016  Procedure: CORONARY STENT INTERVENTION;  Surgeon: Nelva Bush, MD;  Location: [place] [facility];  Service: Cardiovascular;  Laterality: N/A; . CYSTOSCOPY WITH RETROGRADE PYELOGRAM, URETEROSCOPY AND STENT PLACEMENT Left 06/17/2019  Procedure: CYSTOSCOPY WITH RETROGRADE PYELOGRAM, URETEROSCOPY AND STENT PLACEMENT;  Surgeon: Robley Fries, MD;  [facility];  Service: Urology;  Laterality: Left;  1 HR . HOLMIUM LASER APPLICATION Left 05/13/7320  Procedure: HOLMIUM LASER APPLICATION;  Surgeon: Robley Fries, MD;  [facility];  Service: Urology;  Laterality: Left; . INTRAVASCULAR ULTRASOUND/IVUS N/A 10/09/2016  Procedure: Intravascular Ultrasound/IVUS;  Surgeon: Nelva Bush, MD;  Location: [employer] [facility];  Service: Cardiovascular;  Laterality: N/A; . IR CHOLANGIOGRAM EXISTING TUBE  11/18/2018 . IR PERC CHOLECYSTOSTOMY  11/16/2018 . LEFT HEART CATH AND CORONARY ANGIOGRAPHY N/A 10/09/2016  Procedure: LEFT HEART CATH AND CORONARY ANGIOGRAPHY;  Surgeon: Nelva Bush, MD;  Location: [place] [facility];  Service: Cardiovascular;  Laterality: N/A; . LOOP RECORDER INSERTION N/A 02/20/2020  Procedure: LOOP RECORDER INSERTION;  Surgeon: Vickie Epley, MD;  Location: [place] [facility];  Service: Cardiovascular;  Laterality: N/A; HPI: Pt is a 70 y/o male admitted secondary to slurred speech and L sided weakness. 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 PMH includes tobacco use, CAD, COPD, anxiety, and depression.  Subjective: alert, cooperative, dysarthric Assessment / Plan / Recommendation CHL IP CLINICAL IMPRESSIONS 02/26/2020 Clinical  Impression Patient continues to present with a mild-mod oral and a mod-severe pharyngeal dysphagia but as compared to previous MBS on 2/9, has demonstrated some improvement in overall swallow function. He exhibited swallow initiation delays to pyriform sinus with thin liquids as well as silent penetration that did not fully clear. There was one instance of suspected trace aspiration after swallow from thin liquid residuals that had previously penetrated, however patient was moving too much for a clear image. He did not exhibit any aspiration or penetration with puree solids, regular solids, barium tablet or nectar thick liquids, but continues with significant pharyngeal residuals. Residuals are increased with heavier boluses but do start to clear with subsequent swallows. SLP did not observe significant benefit of chin tuck posture with preventing or minimizing pharyngeal residuals or aspiraiton, penetraiton. SLP Visit Diagnosis Dysphagia, oropharyngeal phase (R13.12) Attention and concentration deficit following -- Frontal lobe and executive function deficit following -- Impact on safety and function Mild aspiration risk;Moderate aspiration risk   CHL IP TREATMENT RECOMMENDATION 02/26/2020 Treatment Recommendations Therapy as outlined in treatment plan below   Prognosis 02/18/2020 Prognosis for Safe Diet Advancement Good Barriers to Reach Goals Cognitive deficits;Severity of deficits Barriers/Prognosis Comment -- CHL IP DIET RECOMMENDATION 02/26/2020 SLP Diet Recommendations Dysphagia 1 (Puree) solids;Nectar thick liquid Liquid Administration via Cup;No straw Medication Administration Crushed with puree Compensations Minimize environmental distractions;Slow rate;Small sips/bites;Follow solids with liquid;Clear throat intermittently Postural Changes --   CHL IP OTHER RECOMMENDATIONS 02/26/2020 Recommended Consults -- Oral Care Recommendations Oral care BID Other Recommendations --   CHL IP FOLLOW UP RECOMMENDATIONS  02/22/2020 Follow up Recommendations Inpatient Rehab   CHL IP FREQUENCY AND DURATION 02/18/2020 Speech Therapy Frequency (ACUTE ONLY) min 2x/week Treatment Duration 2 weeks      CHL IP ORAL PHASE 02/26/2020 Oral Phase Impaired Oral - Pudding Teaspoon -- Oral - Pudding Cup -- Oral - Honey Teaspoon -- Oral - Honey Cup Weak lingual manipulation;Reduced  posterior propulsion Oral - Nectar Teaspoon NT Oral - Nectar Cup Weak lingual manipulation;Reduced posterior propulsion Oral - Nectar Straw -- Oral - Thin Teaspoon -- Oral - Thin Cup Weak lingual manipulation;Reduced posterior propulsion Oral - Thin Straw -- Oral - Puree Reduced posterior propulsion;Weak lingual manipulation Oral - Mech Soft -- Oral - Regular Reduced posterior propulsion;Weak lingual manipulation;Impaired mastication Oral - Multi-Consistency -- Oral - Pill Reduced posterior propulsion Oral Phase - Comment --  CHL IP PHARYNGEAL PHASE 02/26/2020 Pharyngeal Phase Impaired Pharyngeal- Pudding Teaspoon -- Pharyngeal -- Pharyngeal- Pudding Cup -- Pharyngeal -- Pharyngeal- Honey Teaspoon -- Pharyngeal -- Pharyngeal- Honey Cup -- Pharyngeal -- Pharyngeal- Nectar Teaspoon NT Pharyngeal -- Pharyngeal- Nectar Cup Delayed swallow initiation-vallecula;Reduced pharyngeal peristalsis;Reduced tongue base retraction;Pharyngeal residue - valleculae;Pharyngeal residue - pyriform;Pharyngeal residue - posterior pharnyx Pharyngeal Material does not enter airway Pharyngeal- Nectar Straw -- Pharyngeal -- Pharyngeal- Thin Teaspoon -- Pharyngeal -- Pharyngeal- Thin Cup Delayed swallow initiation-pyriform sinuses;Delayed swallow initiation-vallecula;Reduced airway/laryngeal closure;Reduced tongue base retraction;Reduced pharyngeal peristalsis;Penetration/Aspiration during swallow;Penetration/Apiration after swallow;Pharyngeal residue - valleculae;Pharyngeal residue - pyriform;Pharyngeal residue - posterior pharnyx Pharyngeal Material enters airway, remains ABOVE vocal cords and not  ejected out;Material enters airway, passes BELOW cords without attempt by patient to eject out (silent aspiration) Pharyngeal- Thin Straw -- Pharyngeal -- Pharyngeal- Puree Reduced tongue base retraction;Reduced pharyngeal peristalsis;Pharyngeal residue - valleculae;Pharyngeal residue - posterior pharnyx Pharyngeal Material does not enter airway Pharyngeal- Mechanical Soft -- Pharyngeal -- Pharyngeal- Regular Pharyngeal residue - valleculae;Pharyngeal residue - posterior pharnyx;Reduced tongue base retraction;Reduced pharyngeal peristalsis Pharyngeal Material does not enter airway Pharyngeal- Multi-consistency -- Pharyngeal -- Pharyngeal- Pill WFL Pharyngeal Material does not enter airway Pharyngeal Comment --  CHL IP CERVICAL ESOPHAGEAL PHASE 02/26/2020 Cervical Esophageal Phase WFL Pudding Teaspoon -- Pudding Cup -- Honey Teaspoon -- Honey Cup -- Nectar Teaspoon -- Nectar Cup -- Nectar Straw -- Thin Teaspoon -- Thin Cup -- Thin Straw -- Puree -- Mechanical Soft -- Regular -- Multi-consistency -- Pill -- Cervical Esophageal Comment -- Sonia Baller, MA, CCC-SLP Speech Therapy             DG Swallowing Func-Speech Pathology  Result Date: 02/18/2020 Objective Swallowing Evaluation: Type of Study: MBS-Modified Barium Swallow Study  Patient Details Name: David Hendricks MRN: [id number] Date of Birth: Dec 28, 1950 Today's Date: 02/18/2020 Time: SLP Start Time (ACUTE ONLY): 1409 -SLP Stop Time (ACUTE ONLY): 1427 SLP Time Calculation (min) (ACUTE ONLY): 18 min Past Medical History: Past Medical History: Diagnosis Date . Allergy  . Anxiety  . Aortic atherosclerosis ([place])  . Arthritis  . Benign prostatic hyperplasia (BPH) with urinary urgency  . Chronic kidney disease   KIDNEY STONES . COPD (chronic obstructive pulmonary disease) ([place])   possible  . Coronary artery disease   10/18 PCI/DES to pLAD, mild nonobstructive disease in the Lcx/RCA. Normal EF.  Marland Kitchen Depression  . Dyslipidemia  . Dysrhythmia 2016  irregular heartbeat .  Gallstones  . GERD (gastroesophageal reflux disease)  . History of kidney stones  . Hyperlipidemia  . Hyperthyroidism  . Leg cramps  . Multiple pulmonary nodules  . Myocardial infarction ([place])   2018 . Pneumonia  . Seasonal allergies  . Thyroid nodule  . Vertigo  . Vitamin B 12 deficiency  Past Surgical History: Past Surgical History: Procedure Laterality Date . COLONOSCOPY   . CORONARY ANGIOPLASTY  10/09/2016 . CORONARY STENT INTERVENTION N/A 10/09/2016  Procedure: CORONARY STENT INTERVENTION;  Surgeon: Nelva Bush, MD;  Location: [place] [facility];  Service: Cardiovascular;  Laterality: N/A; . CYSTOSCOPY WITH  RETROGRADE PYELOGRAM, URETEROSCOPY AND STENT PLACEMENT Left 06/17/2019  Procedure: CYSTOSCOPY WITH RETROGRADE PYELOGRAM, URETEROSCOPY AND STENT PLACEMENT;  Surgeon: Robley Fries, MD;  [facility];  Service: Urology;  Laterality: Left;  1 HR . HOLMIUM LASER APPLICATION Left 08/17/2117  Procedure: HOLMIUM LASER APPLICATION;  Surgeon: Robley Fries, MD;  [facility];  Service: Urology;  Laterality: Left; . INTRAVASCULAR ULTRASOUND/IVUS N/A 10/09/2016  Procedure: Intravascular Ultrasound/IVUS;  Surgeon: Nelva Bush, MD;  Location: [place] [facility];  Service: Cardiovascular;  Laterality: N/A; . IR CHOLANGIOGRAM EXISTING TUBE  11/18/2018 . IR PERC CHOLECYSTOSTOMY  11/16/2018 . LEFT HEART CATH AND CORONARY ANGIOGRAPHY N/A 10/09/2016  Procedure: LEFT HEART CATH AND CORONARY ANGIOGRAPHY;  Surgeon: Nelva Bush, MD;  Location: [place] [facility];  Service: Cardiovascular;  Laterality: N/A; HPI: Pt is a 70 y/o male admitted secondary to slurred speech and L sided weakness. 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 PMH includes tobacco use, CAD, COPD, anxiety, and depression.  Subjective: alert, cooperative, dysarthric Assessment / Plan / Recommendation CHL IP CLINICAL IMPRESSIONS 02/18/2020 Clinical Impression Although pt presents with similar areas of weakness compared to yesterday's MBS,  his improved mentation has a positive impact on his performance and ability to incorporate swallowing strategies. Head turns do not have a significant effect, but he has mildly improved vallecular clearance with use of a chin tuck and hard swallows. Residue is still significant, but he demonstrated some awareness when he spontaneously brought most of the residue back into his mouth to swallow it again, clearing more of it on the second try. On subsequent swallows, pt could be cued to do the same. Penetration and trace aspiration occurs with nectar thick liquids that still spill into the airway before he swallows, but no aspiration is observed with honey thick liquids or purees. There is trace amounts of penetration that is not always fully cleared, and when also considering the amount of vallecular residue he has, he would still remain at risk for some aspiration across the duration of a meal. Discussed with pt and MD, who is in agreement with starting modified diet with use of precautions to reduce but not eliminate aspiration risk while palliative care consult is pending. Therefore, will start Dys 1 diet and honey thick liquids with the following  precautions: chin tuck, alternate solids/liquids, hard swallows, cue to bring food back into his mouth and swallow again.  SLP Visit Diagnosis Dysphagia, oropharyngeal phase (R13.12) Attention and concentration deficit following -- Frontal lobe and executive function deficit following -- Impact on safety and function Moderate aspiration risk   CHL IP TREATMENT RECOMMENDATION 02/18/2020 Treatment Recommendations Therapy as outlined in treatment plan below   Prognosis 02/18/2020 Prognosis for Safe Diet Advancement Good Barriers to Reach Goals Cognitive deficits;Severity of deficits Barriers/Prognosis Comment -- CHL IP DIET RECOMMENDATION 02/18/2020 SLP Diet Recommendations Dysphagia 1 (Puree) solids;Honey thick liquids Liquid Administration via Cup Medication Administration  Crushed with puree Compensations Minimize environmental distractions;Slow rate;Follow solids with liquid;Chin tuck;Other (Comment) Postural Changes Seated upright at 90 degrees;Remain semi-upright after after feeds/meals (Comment)   CHL IP OTHER RECOMMENDATIONS 02/18/2020 Recommended Consults -- Oral Care Recommendations Oral care QID Other Recommendations Have oral suction available;Order thickener from pharmacy;Prohibited food (jello, ice cream, thin soups);Remove water pitcher   CHL IP FOLLOW UP RECOMMENDATIONS 02/18/2020 Follow up Recommendations Inpatient Rehab   CHL IP FREQUENCY AND DURATION 02/18/2020 Speech Therapy Frequency (ACUTE ONLY) min 2x/week Treatment Duration 2 weeks      CHL IP ORAL PHASE  02/18/2020 Oral Phase Impaired Oral - Pudding Teaspoon -- Oral - Pudding Cup -- Oral - Honey Teaspoon -- Oral - Honey Cup Reduced posterior propulsion;Decreased bolus cohesion;Lingual/palatal residue;Weak lingual manipulation Oral - Nectar Teaspoon Reduced posterior propulsion;Decreased bolus cohesion;Lingual/palatal residue;Weak lingual manipulation Oral - Nectar Cup -- Oral - Nectar Straw -- Oral - Thin Teaspoon -- Oral - Thin Cup -- Oral - Thin Straw -- Oral - Puree Reduced posterior propulsion;Decreased bolus cohesion;Lingual/palatal residue;Weak lingual manipulation Oral - Mech Soft -- Oral - Regular -- Oral - Multi-Consistency -- Oral - Pill -- Oral Phase - Comment --  CHL IP PHARYNGEAL PHASE 02/18/2020 Pharyngeal Phase Impaired Pharyngeal- Pudding Teaspoon -- Pharyngeal -- Pharyngeal- Pudding Cup -- Pharyngeal -- Pharyngeal- Honey Teaspoon -- Pharyngeal -- Pharyngeal- Honey Cup Reduced pharyngeal peristalsis;Reduced epiglottic inversion;Reduced anterior laryngeal mobility;Reduced laryngeal elevation;Reduced tongue base retraction;Pharyngeal residue - valleculae;Penetration/Aspiration before swallow Pharyngeal Material enters airway, remains ABOVE vocal cords and not ejected out Pharyngeal- Nectar Teaspoon Reduced  pharyngeal peristalsis;Reduced epiglottic inversion;Reduced anterior laryngeal mobility;Reduced laryngeal elevation;Reduced tongue base retraction;Pharyngeal residue - valleculae;Penetration/Aspiration before swallow Pharyngeal Material enters airway, passes BELOW cords without attempt by patient to eject out (silent aspiration) Pharyngeal- Nectar Cup -- Pharyngeal -- Pharyngeal- Nectar Straw -- Pharyngeal -- Pharyngeal- Thin Teaspoon -- Pharyngeal -- Pharyngeal- Thin Cup -- Pharyngeal -- Pharyngeal- Thin Straw -- Pharyngeal -- Pharyngeal- Puree Reduced pharyngeal peristalsis;Reduced epiglottic inversion;Reduced anterior laryngeal mobility;Reduced laryngeal elevation;Reduced tongue base retraction;Pharyngeal residue - valleculae;Penetration/Apiration after swallow Pharyngeal Material enters airway, remains ABOVE vocal cords then ejected out Pharyngeal- Mechanical Soft -- Pharyngeal -- Pharyngeal- Regular -- Pharyngeal -- Pharyngeal- Multi-consistency -- Pharyngeal -- Pharyngeal- Pill -- Pharyngeal -- Pharyngeal Comment --  CHL IP CERVICAL ESOPHAGEAL PHASE 02/18/2020 Cervical Esophageal Phase WFL Pudding Teaspoon -- Pudding Cup -- Honey Teaspoon -- Honey Cup -- Nectar Teaspoon -- Nectar Cup -- Nectar Straw -- Thin Teaspoon -- Thin Cup -- Thin Straw -- Puree -- Mechanical Soft -- Regular -- Multi-consistency -- Pill -- Cervical Esophageal Comment -- Osie Bond., M.A. CCC-SLP Acute Rehabilitation Services Pager [phone] Office [phone] 02/18/2020, 4:35 PM              DG Swallowing Func-Speech Pathology  Result Date: 02/17/2020 Objective Swallowing Evaluation: Type of Study: MBS-Modified Barium Swallow Study  Patient Details Name: David Hendricks MRN: [id number] Date of Birth: 03-23-1950 Today's Date: 02/17/2020 Time: SLP Start Time (ACUTE ONLY): 1335 -SLP Stop Time (ACUTE ONLY): 1353 SLP Time Calculation (min) (ACUTE ONLY): 18 min Past Medical History: Past Medical History: Diagnosis Date . Allergy  . Anxiety  . Aortic  atherosclerosis ([place])  . Arthritis  . Benign prostatic hyperplasia (BPH) with urinary urgency  . Chronic kidney disease   KIDNEY STONES . COPD (chronic obstructive pulmonary disease) ([place])   possible  . Coronary artery disease   10/18 PCI/DES to pLAD, mild nonobstructive disease in the Lcx/RCA. Normal EF.  Marland Kitchen Depression  . Dyslipidemia  . Dysrhythmia 2016  irregular heartbeat . Gallstones  . GERD (gastroesophageal reflux disease)  . History of kidney stones  . Hyperlipidemia  . Hyperthyroidism  . Leg cramps  . Multiple pulmonary nodules  . Myocardial infarction ([place])   2018 . Pneumonia  . Seasonal allergies  . Thyroid nodule  . Vertigo  . Vitamin B 12 deficiency  Past Surgical History: Past Surgical History: Procedure Laterality Date . COLONOSCOPY   . CORONARY ANGIOPLASTY  10/09/2016 . CORONARY STENT INTERVENTION N/A 10/09/2016  Procedure: CORONARY STENT INTERVENTION;  Surgeon: Nelva Bush, MD;  Location: [place] [facility];  Service: Cardiovascular;  Laterality: N/A; . CYSTOSCOPY WITH RETROGRADE PYELOGRAM, URETEROSCOPY AND STENT PLACEMENT Left 06/17/2019  Procedure: CYSTOSCOPY WITH RETROGRADE PYELOGRAM, URETEROSCOPY AND STENT PLACEMENT;  Surgeon: Robley Fries, MD;  Location: [facility];  Service: Urology;  Laterality: Left;  1 HR . HOLMIUM LASER APPLICATION Left 06/13/3327  Procedure: HOLMIUM LASER APPLICATION;  Surgeon: Robley Fries, MD;  Location: [facility];  Service: Urology;  Laterality: Left; . INTRAVASCULAR ULTRASOUND/IVUS N/A 10/09/2016  Procedure: Intravascular Ultrasound/IVUS;  Surgeon: Nelva Bush, MD;  Location: [place] [facility];  Service: Cardiovascular;  Laterality: N/A; . IR CHOLANGIOGRAM EXISTING TUBE  11/18/2018 . IR PERC CHOLECYSTOSTOMY  11/16/2018 . LEFT HEART CATH AND CORONARY ANGIOGRAPHY N/A 10/09/2016  Procedure: LEFT HEART CATH AND CORONARY ANGIOGRAPHY;  Surgeon: Nelva Bush, MD;  Location: [place];  Service: Cardiovascular;  Laterality: N/A; HPI: Pt is a 70 y/o male  admitted secondary to slurred speech and L sided weakness. 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 PMH includes tobacco use, CAD, COPD, anxiety, and depression.  Subjective: pt lethargic but following commands Assessment / Plan / Recommendation CHL IP CLINICAL IMPRESSIONS 02/17/2020 Clinical Impression Pt presents with a significant oropharyngeal dysphagia that is also exacerbated by current level of alertness. He is quite lethargic throughout testing, although alert enough to follow commands and accept boluses consistently. Limited trials were given in light of severity of dysphagia and mentation. He has limited lingual control of boluses, allowing a spoonful of nectar thick liquids to spill immediately back into his laryngeal vestibule. Once it is there, he tries to achieve some laryngeal closure, which is successful at expelling some of the bolus back out of his airway, but he does aspirate some of it. Pt also has very limited base of tongue retraction, hyolaryngeal movement, and pharyngeal squeeze, so that almost an entire bolus of puree remains in his valleculae post-swallow. He cannot clear this on his own despite cues, with SLP also providing suction via yankauer to clear as much as possible. As this pureed residue seems to thin out by mixing with secretions, he begins to aspirate this as well. Recommend that pt remain NPO for now with consideration of temporary, alternative means of nutrition with plans to retest swallowing function when more alert. MD in agreement with keeping pt NPO and also plans to involve palliative care.  SLP Visit Diagnosis Dysphagia, oropharyngeal phase (R13.12) Attention and concentration deficit following -- Frontal lobe and executive function deficit following -- Impact on safety and function Severe aspiration risk   CHL IP TREATMENT RECOMMENDATION 02/17/2020 Treatment Recommendations Therapy as outlined in treatment plan below   Prognosis 02/17/2020 Prognosis for Safe Diet  Advancement Fair Barriers to Reach Goals Cognitive deficits;Severity of deficits Barriers/Prognosis Comment -- CHL IP DIET RECOMMENDATION 02/17/2020 SLP Diet Recommendations NPO;Alternative means - temporary Liquid Administration via -- Medication Administration Via alternative means Compensations -- Postural Changes --   CHL IP OTHER RECOMMENDATIONS 02/17/2020 Recommended Consults -- Oral Care Recommendations Oral care QID Other Recommendations Have oral suction available   CHL IP FOLLOW UP RECOMMENDATIONS 02/17/2020 Follow up Recommendations Inpatient Rehab   CHL IP FREQUENCY AND DURATION 02/17/2020 Speech Therapy Frequency (ACUTE ONLY) min 2x/week Treatment Duration 2 weeks      CHL IP ORAL PHASE 02/17/2020 Oral Phase Impaired Oral - Pudding Teaspoon -- Oral - Pudding Cup -- Oral - Honey Teaspoon -- Oral - Honey Cup -- Oral - Nectar Teaspoon Decreased bolus cohesion;Weak lingual manipulation Oral - Nectar Cup -- Oral - Nectar  Straw -- Oral - Thin Teaspoon -- Oral - Thin Cup -- Oral - Thin Straw -- Oral - Puree Lingual/palatal residue;Weak lingual manipulation Oral - Mech Soft -- Oral - Regular -- Oral - Multi-Consistency -- Oral - Pill -- Oral Phase - Comment --  CHL IP PHARYNGEAL PHASE 02/17/2020 Pharyngeal Phase Impaired Pharyngeal- Pudding Teaspoon -- Pharyngeal -- Pharyngeal- Pudding Cup -- Pharyngeal -- Pharyngeal- Honey Teaspoon -- Pharyngeal -- Pharyngeal- Honey Cup -- Pharyngeal -- Pharyngeal- Nectar Teaspoon Reduced pharyngeal peristalsis;Reduced epiglottic inversion;Reduced anterior laryngeal mobility;Reduced laryngeal elevation;Reduced tongue base retraction;Pharyngeal residue - valleculae;Penetration/Aspiration before swallow Pharyngeal Material enters airway, passes BELOW cords without attempt by patient to eject out (silent aspiration) Pharyngeal- Nectar Cup -- Pharyngeal -- Pharyngeal- Nectar Straw -- Pharyngeal -- Pharyngeal- Thin Teaspoon -- Pharyngeal -- Pharyngeal- Thin Cup -- Pharyngeal -- Pharyngeal-  Thin Straw -- Pharyngeal -- Pharyngeal- Puree Reduced pharyngeal peristalsis;Reduced epiglottic inversion;Reduced anterior laryngeal mobility;Reduced laryngeal elevation;Reduced tongue base retraction;Pharyngeal residue - valleculae;Penetration/Apiration after swallow Pharyngeal Material enters airway, passes BELOW cords without attempt by patient to eject out (silent aspiration) Pharyngeal- Mechanical Soft -- Pharyngeal -- Pharyngeal- Regular -- Pharyngeal -- Pharyngeal- Multi-consistency -- Pharyngeal -- Pharyngeal- Pill -- Pharyngeal -- Pharyngeal Comment --  CHL IP CERVICAL ESOPHAGEAL PHASE 02/17/2020 Cervical Esophageal Phase (No Data) Pudding Teaspoon -- Pudding Cup -- Honey Teaspoon -- Honey Cup -- Nectar Teaspoon -- Nectar Cup -- Nectar Straw -- Thin Teaspoon -- Thin Cup -- Thin Straw -- Puree -- Mechanical Soft -- Regular -- Multi-consistency -- Pill -- Cervical Esophageal Comment -- Osie Bond., M.A. [place] Pager [phone] Office [phone] 02/17/2020, 3:25 PM              ECHOCARDIOGRAM COMPLETE  Result Date: 02/16/2020    ECHOCARDIOGRAM REPORT   Patient Name:   David Hendricks Date of Exam: 02/16/2020 Medical Rec #:  [id number]  Height:       73.0 in Accession #:    [id number] Weight:       220.0 lb Date of Birth:  1950/04/11  BSA:          2.241 m Patient Age:    36 years   BP:           116/57 mmHg Patient Gender: M          HR:           60 bpm. Exam Location:  Inpatient Procedure: 2D Echo, Cardiac Doppler and Color Doppler Indications:    Stroke  History:        Patient has prior history of Echocardiogram examinations, most                 recent 01/24/2017. Previous Myocardial Infarction and CAD,                 Stroke, Signs/Symptoms:Chest Pain; Risk Factors:Current Smoker.  Sonographer:    Roseanna Rainbow RDCS Referring Phys: [phone] NA LI  Sonographer Comments: Technically difficult study due to poor echo windows. IMPRESSIONS  1. Left ventricular ejection fraction, by estimation,  is 60 to 65%. The left ventricle has normal function. The left ventricle has no regional wall motion abnormalities. There is mild left ventricular hypertrophy. Left ventricular diastolic parameters are consistent with Grade I diastolic dysfunction (impaired relaxation).  2. Right ventricular systolic function is mildly reduced. The right ventricular size is mildly enlarged. Tricuspid regurgitation signal is inadequate for assessing PA pressure.  3. The mitral valve is normal in structure. Trivial mitral valve regurgitation. No evidence  of mitral stenosis.  4. The aortic valve is tricuspid. Aortic valve regurgitation is not visualized. No aortic stenosis is present.  5. Aortic dilatation noted. There is mild dilatation of the ascending aorta, measuring 40 mm.  6. The inferior vena cava is normal in size with greater than 50% respiratory variability, suggesting right atrial pressure of 3 mmHg. FINDINGS  Left Ventricle: Left ventricular ejection fraction, by estimation, is 60 to 65%. The left ventricle has normal function. The left ventricle has no regional wall motion abnormalities. The left ventricular internal cavity size was normal in size. There is  mild left ventricular hypertrophy. Left ventricular diastolic parameters are consistent with Grade I diastolic dysfunction (impaired relaxation). Right Ventricle: The right ventricular size is mildly enlarged. No increase in right ventricular wall thickness. Right ventricular systolic function is mildly reduced. Tricuspid regurgitation signal is inadequate for assessing PA pressure. Left Atrium: Left atrial size was normal in size. Right Atrium: Right atrial size was normal in size. Pericardium: Trivial pericardial effusion is present. Mitral Valve: The mitral valve is normal in structure. Trivial mitral valve regurgitation. No evidence of mitral valve stenosis. Tricuspid Valve: The tricuspid valve is normal in structure. Tricuspid valve regurgitation is not  demonstrated. Aortic Valve: The aortic valve is tricuspid. Aortic valve regurgitation is not visualized. No aortic stenosis is present. Pulmonic Valve: The pulmonic valve was normal in structure. Pulmonic valve regurgitation is not visualized. Aorta: Aortic dilatation noted. There is mild dilatation of the ascending aorta, measuring 40 mm. Venous: The inferior vena cava is normal in size with greater than 50% respiratory variability, suggesting right atrial pressure of 3 mmHg. IAS/Shunts: No atrial level shunt detected by color flow Doppler.  LEFT VENTRICLE PLAX 2D LVIDd:         4.40 cm      Diastology LVIDs:         2.80 cm      LV e' medial:    7.29 cm/s LV PW:         1.30 cm      LV E/e' medial:  11.1 LV IVS:        1.50 cm      LV e' lateral:   12.40 cm/s LVOT diam:     1.80 cm      LV E/e' lateral: 6.5 LV SV:         46 LV SV Index:   20 LVOT Area:     2.54 cm  LV Volumes (MOD) LV vol d, MOD A2C: 36.9 ml LV vol d, MOD A4C: 120.0 ml LV vol s, MOD A2C: 11.2 ml LV vol s, MOD A4C: 36.3 ml LV SV MOD A2C:     25.7 ml LV SV MOD A4C:     120.0 ml LV SV MOD BP:      46.2 ml RIGHT VENTRICLE RV S prime:     9.03 cm/s TAPSE (M-mode): 1.4 cm LEFT ATRIUM             Index       RIGHT ATRIUM          Index LA diam:        3.75 cm 1.67 cm/m  RA Area:     8.46 cm LA Vol (A2C):   13.5 ml 6.03 ml/m  RA Volume:   19.90 ml 8.88 ml/m LA Vol (A4C):   23.9 ml 10.67 ml/m LA Biplane Vol: 19.8 ml 8.84 ml/m  AORTIC VALVE LVOT Vmax:   75.10 cm/s LVOT  Vmean:  49.700 cm/s LVOT VTI:    0.180 m  AORTA Ao Root diam: 3.60 cm Ao Asc diam:  4.00 cm MITRAL VALVE MV Area (PHT): 3.27 cm    SHUNTS MV Decel Time: 232 msec    Systemic VTI:  0.18 m MV E velocity: 80.60 cm/s  Systemic Diam: 1.80 cm MV A velocity: 82.70 cm/s MV E/A ratio:  0.97 Loralie Champagne MD Electronically signed by Loralie Champagne MD Signature Date/Time: 02/16/2020/3:57:28 PM    Final    CT HEAD CODE STROKE WO CONTRAST  Result Date: 02/15/2020 CLINICAL DATA:  Code stroke.  Left-sided weakness. Facial droop and slurred speech. EXAM: CT HEAD WITHOUT CONTRAST TECHNIQUE: Contiguous axial images were obtained from the base of the skull through the vertex without intravenous contrast. COMPARISON:  December 31, 2019. FINDINGS: Brain: No evidence of acute large vascular territory infarct. There is moderate to advanced patchy white matter hypoattenuation, nonspecific but most likely related to chronic microvascular ischemic disease. No acute hemorrhage. Moderate diffuse cerebral atrophy with ex vacuo ventricular dilation. No mass lesion or abnormal mass effect. No extra-axial fluid collection. Vascular: No definite hyperdense vessel. Mild diffuse hyperdensity of the dural sinuses and vessels appears similar to prior. Skull: No acute fracture. Sinuses/Orbits: Sinuses are largely clear. Other: No mastoid effusions. ASPECTS [facility] Stroke Program Early CT Score) Total score (0-10 with 10 being normal): 10 IMPRESSION: 1. No evidence of acute large vascular territory infarct.ASPECTS is 10. 2. Moderate to advanced patchy white matter hypoattenuation appears similar to prior and presumably related to chronic microvascular ischemic disease. A small white matter infarct could be obscured and MRI could better evaluate for acute ischemia if clinically indicated. Code stroke imaging results were communicated on 02/15/2020 at 1:45 pm to provider Dr. Theda Sers Via telephone, who verbally acknowledged these results. Electronically Signed   By: Margaretha Sheffield MD   On: 02/15/2020 13:48   VAS [place] LOWER EXTREMITY VENOUS (DVT)  Result Date: 02/16/2020  Lower Venous DVT Study Indications: Stroke.  Risk Factors: None identified. Comparison Study: No prior studies. Performing Technologist: Oliver Hum RVT  Examination Guidelines: A complete evaluation includes B-mode imaging, spectral Doppler, color Doppler, and power Doppler as needed of all accessible portions of each vessel. Bilateral testing is considered  an integral part of a complete examination. Limited examinations for reoccurring indications may be performed as noted. The reflux portion of the exam is performed with the patient in reverse Trendelenburg.  +---------+---------------+---------+-----------+----------+--------------+ RIGHT    CompressibilityPhasicitySpontaneityPropertiesThrombus Aging +---------+---------------+---------+-----------+----------+--------------+ CFV      Full           Yes      Yes                                 +---------+---------------+---------+-----------+----------+--------------+ SFJ      Full                                                        +---------+---------------+---------+-----------+----------+--------------+ FV Prox  Full                                                        +---------+---------------+---------+-----------+----------+--------------+  FV Mid   Full                                                        +---------+---------------+---------+-----------+----------+--------------+ FV DistalFull                                                        +---------+---------------+---------+-----------+----------+--------------+ PFV      Full                                                        +---------+---------------+---------+-----------+----------+--------------+ POP      Full           Yes      Yes                                 +---------+---------------+---------+-----------+----------+--------------+ PTV      Full                                                        +---------+---------------+---------+-----------+----------+--------------+ PERO     Full                                                        +---------+---------------+---------+-----------+----------+--------------+   +---------+---------------+---------+-----------+----------+--------------+ LEFT      CompressibilityPhasicitySpontaneityPropertiesThrombus Aging +---------+---------------+---------+-----------+----------+--------------+ CFV      Full           Yes      Yes                                 +---------+---------------+---------+-----------+----------+--------------+ SFJ      Full                                                        +---------+---------------+---------+-----------+----------+--------------+ FV Prox  Full                                                        +---------+---------------+---------+-----------+----------+--------------+ FV Mid   Full                                                        +---------+---------------+---------+-----------+----------+--------------+  FV DistalFull                                                        +---------+---------------+---------+-----------+----------+--------------+ PFV      Full                                                        +---------+---------------+---------+-----------+----------+--------------+ POP      Full           Yes      Yes                                 +---------+---------------+---------+-----------+----------+--------------+ PTV      Full                                                        +---------+---------------+---------+-----------+----------+--------------+ PERO     Full                                                        +---------+---------------+---------+-----------+----------+--------------+     Summary: RIGHT: - There is no evidence of deep vein thrombosis in the lower extremity.  - No cystic structure found in the popliteal fossa.  LEFT: - There is no evidence of deep vein thrombosis in the lower extremity.  - No cystic structure found in the popliteal fossa.  *See table(s) above for measurements and observations. Electronically signed by Jamelle Haring on 02/16/2020 at 5:03:07 PM.    Final    CT ANGIO HEAD CODE  STROKE  Result Date: 02/15/2020 CLINICAL DATA:  Speech difficulty and left-sided weakness. EXAM: CT ANGIOGRAPHY HEAD AND NECK TECHNIQUE: Multidetector CT imaging of the head and neck was performed using the standard protocol during bolus administration of intravenous contrast. Multiplanar CT image reconstructions and MIPs were obtained to evaluate the vascular anatomy. Carotid stenosis measurements (when applicable) are obtained utilizing NASCET criteria, using the distal internal carotid diameter as the denominator. CONTRAST:  62mL OMNIPAQUE IOHEXOL 350 MG/ML SOLN COMPARISON:  Same day CT head. FINDINGS: CTA NECK FINDINGS Aortic arch: Calcific atherosclerosis of the aorta. Great vessel origins are patent. Right carotid system: No evidence of dissection, stenosis (50% or greater) or occlusion. Left carotid system: No evidence of dissection, stenosis (50% or greater) or occlusion. Vertebral arteries: Left dominant. The right vertebral artery is diminutive throughout its course. No evidence of significant focal stenosis in the neck. Skeleton: Mild multilevel degenerative change of the cervical spine. Other neck: Enlarged, heterogeneous thyroid with numerous nodules and retrosternal extension. Upper chest: Paraseptal and centrilobular emphysema. No focal consolidation. Review of the MIP images confirms the above findings CTA HEAD FINDINGS Anterior circulation: No significant stenosis, proximal occlusion, aneurysm, or vascular malformation. Posterior circulation: The diminutive/non dominant right vertebral artery is likely occluded  intradurally. The left intradural vertebral artery is patent and without evidence of significant stenosis. Diminutive vertebrobasilar system with bilateral fetal type PCAs. Bilateral posterior cerebral arteries are patent without proximal hemodynamically significant stenosis. Evaluation of the distal PCAs is limited by venous contamination. Venous sinuses: As permitted by contrast timing,  patent. Review of the MIP images confirms the above findings IMPRESSION: 1. 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 2. Otherwise, no large vessel occlusion or proximal hemodynamically significant stenosis in the head or neck 3. Diminutive vertebrobasilar system intracranially with bilateral fetal type PCAs, anatomic variant. 4. Enlarged, heterogeneous thyroid with numerous nodules and retrosternal extension. This may reflect goiter; however, recommend thyroid ultrasound to further evaluate. 5. Paraseptal and centrilobular emphysema. Findings were communicated on 02/15/2020 at 1:45 pm to provider Dr. Theda Sers Via telephone, who verbally acknowledged these results. Electronically Signed   By: Margaretha Sheffield MD   On: 02/15/2020 14:00   CT ANGIO NECK CODE STROKE  Result Date: 02/15/2020 CLINICAL DATA:  Speech difficulty and left-sided weakness. EXAM: CT ANGIOGRAPHY HEAD AND NECK TECHNIQUE: Multidetector CT imaging of the head and neck was performed using the standard protocol during bolus administration of intravenous contrast. Multiplanar CT image reconstructions and MIPs were obtained to evaluate the vascular anatomy. Carotid stenosis measurements (when applicable) are obtained utilizing NASCET criteria, using the distal internal carotid diameter as the denominator. CONTRAST:  61mL OMNIPAQUE IOHEXOL 350 MG/ML SOLN COMPARISON:  Same day CT head. FINDINGS: CTA NECK FINDINGS Aortic arch: Calcific atherosclerosis of the aorta. Great vessel origins are patent. Right carotid system: No evidence of dissection, stenosis (50% or greater) or occlusion. Left carotid system: No evidence of dissection, stenosis (50% or greater) or occlusion. Vertebral arteries: Left dominant. The right vertebral artery is diminutive throughout its course. No evidence of significant focal stenosis in the neck. Skeleton: Mild multilevel degenerative change of the cervical spine.  Other neck: Enlarged, heterogeneous thyroid with numerous nodules and retrosternal extension. Upper chest: Paraseptal and centrilobular emphysema. No focal consolidation. Review of the MIP images confirms the above findings CTA HEAD FINDINGS Anterior circulation: No significant stenosis, proximal occlusion, aneurysm, or vascular malformation. 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 The left intradural vertebral artery is patent and without evidence of significant stenosis. Diminutive vertebrobasilar system with bilateral fetal type PCAs. Bilateral posterior cerebral arteries are patent without proximal hemodynamically significant stenosis. Evaluation of the distal PCAs is limited by venous contamination. Venous sinuses: As permitted by contrast timing, patent. Review of the MIP images confirms the above findings IMPRESSION: 1. 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 2. Otherwise, no large vessel occlusion or proximal hemodynamically significant stenosis in the head or neck 3. Diminutive vertebrobasilar system intracranially with bilateral fetal type PCAs, anatomic variant. 4. Enlarged, heterogeneous thyroid with numerous nodules and retrosternal extension. This may reflect goiter; however, recommend thyroid ultrasound to further evaluate. 5. Paraseptal and centrilobular emphysema. Findings were communicated on 02/15/2020 at 1:45 pm to provider Dr. Theda Sers Via telephone, who verbally acknowledged these results. Electronically Signed   By: Margaretha Sheffield MD   On: 02/15/2020 14:00    Labs:  Basic Metabolic Panel: Recent Labs  Lab 03/08/20 0453  NA 138  K 3.8  CL 100  CO2 31  GLUCOSE 98  BUN 13  CREATININE 1.13  CALCIUM 9.2    CBC: Recent Labs  Lab 03/08/20 0453  WBC 10.3  NEUTROABS  6.2  HGB 16.6  HCT 47.6  MCV 85.0  PLT 148*    CBG: Recent Labs  Lab  03/04/20 1203 03/08/20 0559  GLUCAP 111* 106*   Family history.  Father with alcohol abuse and cancer.  Denies any colon cancer esophageal cancer rectal cancer  Brief HPI:   David Hendricks is a 70 y.o. right-handed male with history of COPD, CAD with stenting maintained on aspirin, hyperlipidemia, hypothyroidism, BPH as well as anxiety and depression.  Per chart review lives alone.  Reportedly independent prior to admission.  1 level apartment.  Presented 02/15/2020 with left-sided weakness and slurred speech.  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  Small focus of susceptibility artifact in this region representing small thrombosed distal cortical MCA branch.  Possible additional punctate infarct in the right parietal cortex versus infarct.  No substantial edema or mass-effect.  CT angiogram of the head and neck nondominant right vertebral artery is diminutive throughout its course and likely occluded intradurally otherwise no large vessel occlusion or approximately hemodynamically significant stenosis.  Admission chemistries unremarkable except glucose 104 SARS coronavirus negative hemoglobin 18.3 urine drug screen negative.  Echocardiogram with ejection fraction of 60 to 65% no wall motion abnormalities grade 1 diastolic dysfunction.  Currently maintained on aspirin and Plavix for CVA prophylaxis x3 weeks then Plavix alone.  Loop recorder was placed.  Dysphagia #1 honey thick liquid diet.  Therapy evaluations completed due to patient's left-sided weakness and slurred speech was admitted for a comprehensive rehab program.   Hospital Course: David Hendricks was admitted to rehab 02/24/2020 for inpatient therapies to consist of PT, ST and OT at least three hours five days a week. Past admission physiatrist, therapy team and rehab RN have worked together to provide customized collaborative inpatient rehab.  Pertaining to patient's right MCA cortical subcortical  infarct remained stable planning aspirin and Plavix x3 weeks then Plavix alone follow-up neurology services.  Pain managed use of tramadol as needed as well as Lidoderm patch right scapula.  Baclofen added for spasticity 5 mg 4 times daily.  Mood stabilization with Celexa emotional support provided.  Mood stabilization with trazodone.  Dysphagia dysphagia #1 honey thick liquid diet close monitoring of hydration and was advanced to dysphagia #1 thin liquid diet.  Patient with history of BPH maintained on Flomax titrated to 0.8 mg no dysuria or hematuria.  History of CAD with stenting remain on Plavix aspirin no chest pain or shortness of breath.  Noted questionable thrush placed on Diflucan oral hygiene as directed.  Patient continued on Celexa noted history of anxiety/depression maintained on Celexa with neuropsychology follow-up.  Patient remained on Tapazole for history of hyperthyroidism latest TSH 0.919.  History of tobacco abuse patient received counsel guard cessation of nicotine products it was questionable if he would be compliant with these request.   Blood pressures were monitored on TID basis and controlled     Rehab course: During patient's stay in rehab weekly team conferences were held to monitor patient's progress, set goals and discuss barriers to discharge. At admission, patient required.  Minimal guard sit to supine minimal assist sit to stand minimal assist 70 feet rolling walker.  Moderate assist upper body dressing minimal assist toilet transfers supervision lower body bathing  Physical exam.  Blood pressure 122/65 pulse 76 temperature 98.2 respirations 18 oxygen saturation 90% room air Constitutional.  No acute distress HEENT.  Left facial droop Eyes.  Pupils round and reactive to light  no discharge.nystagmus Neck.  Supple nontender no JVD without thyromegaly Cardiac regular rate rhythm not any extra sounds or murmur heard Abdomen.  Soft nontender positive bowel sounds not  rebound Respiratory effort normal no respiratory distress without wheeze Musculoskeletal.  Normal range of motion Comments.  Right upper extremity 5/5 in biceps triceps, wrist extension, grip.Been finger abduction..... Left upper extremity 5 -/5 except finger abduction 4+/5 Right lower extremity 5/5 in JF, KE, DF and PF Left lower extremity 5 -/5 in same muscles Skin.  Warm and dry.  Loop recorder site clean and dry Neurologic.  Alert mildly dysarthric but intelligible oriented x3 follows commands fair insight and awareness  He/She  has had improvement in activity tolerance, balance, postural control as well as ability to compensate for deficits. He/She has had improvement in functional use RUE/LUE  and RLE/LLE as well as improvement in awareness.  Patient donning clothing in bed with set up.  Supine to sit supervision.  Sit to stand supervision without assistive device ambulates 150 feet without assistive device to the day room.  He can increase his ambulation up to 350 feet.  Supervision car transfers.  He can gather his belongings for activities day living and homemaking.  It was stressed to patient the need for safety as well as no driving.  Speech therapy follow-up for dysphagia diet.  Full family teaching completed plan discharged to home       Disposition: Discharge to home    Diet: Dysphagia #1 thin liquids  Special Instructions: No driving smoking or alcohol    Medications at discharge 1.  Tylenol as needed 2.  Aspirin 81 mg p.o. daily x2 days and stop 3.  Lipitor 80 mg p.o. daily 4.  Baclofen 5 mg p.o. 4 times daily 5.  Celexa 10 mg p.o. daily 6.  Plavix 75 mg p.o. daily 7.  Hydrocortisone cream 3 times daily to affected area 8.  Lidoderm patch change as directed 9.  Magnesium oxide 400 mg p.o. daily 10.  Melatonin 3 mg p.o. nightly 11.  Senokot 1 tablet p.o. twice daily hold for loose stools 12.  Flomax 0.8 mg p.o. after supper 13.  Tramadol 50 mg p.o. 6 hours as  needed pain 14.  NicoDerm patch.  Taper as directed 15.  Albuterol inhaler 2 puffs every 6 hours as needed shortness of breath 16.  Tapazole 5 mg daily 17.  Vitamin B12 injection 1000 mcg every 30 days    Discharge Instructions    Ambulatory referral to Neurology   Complete by: As directed    An appointment is requested in approximately 4 weeks right MCA cortical and subcortical CVA   Ambulatory referral to Physical Medicine Rehab   Complete by: As directed    Moderate complexity follow-up 1 to 2 weeks right middle cerebral artery infarction       Follow-up Information    Lovorn, Jinny Blossom, MD Follow up.   Specialty: Physical Medicine and Rehabilitation Why: Office to call for appointment Contact information: [address] [place] [address] [phone]        Vickie Epley, MD Follow up.   Specialties: Cardiology, Radiology Why: Call for appointment Contact information: [place] [address] [phone]               Signed: Cathlyn Parsons 03/09/2020, 7:49 AM

## 2020-03-07 NOTE — Progress Notes (Signed)
PROGRESS NOTE   Subjective/Complaints:  Pt reports hasn't needed cathing in last 24 hours - however based on flowsheet- is very close to needing caths PVRs 200s-400s- 1x 420cc- but appears he voided and next PVR was 268cc.   Pt says he's "fine" today and got angry when asked any more questions and asked me to leave.    ROS:  Pt denies SOB, abd pain, CP, N/V/C/D, and vision changes  Objective:   No results found. No results for input(s): WBC, HGB, HCT, PLT in the last 72 hours. No results for input(s): NA, K, CL, CO2, GLUCOSE, BUN, CREATININE, CALCIUM in the last 72 hours.  Intake/Output Summary (Last 24 hours) at 03/07/2020 1322 Last data filed at 03/07/2020 1607 Gross per 24 hour  Intake 360 ml  Output 1250 ml  Net -890 ml        Physical Exam: Vital Signs Blood pressure 119/68, pulse 62, temperature 97.6 F (36.4 C), temperature source Oral, resp. rate 18, height 6\' 1"  (1.854 m), weight 92.9 kg, SpO2 93 %.    General: awake, alert, irritable, NAD HENT: conjugate gaze; oropharynx moist CV: regular rate; no JVD Pulmonary: CTA B/L; no W/R/R- good air movement GI: soft, NT, ND, (+)BS Psychiatric: appropriate, but irritable- "fine" in angry tone Neurological: Ox3 Skin: back rash resolved- L anterior shoulder rash improved- almost gone/resolved-stable today Musloskeletal: TTP over infraspinatus and scapula and supraspinatus. Has lidoderm patch in place x 15 minutes    Cervical back: Normal range of motion and neck supple. TTP over R RTC/shoulder    Comments: RUE 5/5 in biceps, triceps, WE, grip and finger abd LUE- 5-/5 except finger abd 4+/5 RLE- 5/5 in JF, KE, DF and PF LLE- 5-/5 in same muscles Neurological: Ox3- Hoffman's on LUE   Speech is mildly dysarthric but intelligible- mildly better today- can understand better Ox3- blurry vision, "even with eyeglasses".  Light touch intact x 4 extremities        Assessment/Plan: 1. Functional deficits which require 3+ hours per day of interdisciplinary therapy in a comprehensive inpatient rehab setting.  Physiatrist is providing close team supervision and 24 hour management of active medical problems listed below.  Physiatrist and rehab team continue to assess barriers to discharge/monitor patient progress toward functional and medical goals  Care Tool:  Bathing    Body parts bathed by patient: Right arm,Left arm,Chest,Abdomen,Front perineal area,Buttocks,Right upper leg,Left upper leg,Right lower leg,Left lower leg,Face         Bathing assist Assist Level: Supervision/Verbal cueing     Upper Body Dressing/Undressing Upper body dressing   What is the patient wearing?: Pull over shirt    Upper body assist Assist Level: Set up assist    Lower Body Dressing/Undressing Lower body dressing      What is the patient wearing?: Pants     Lower body assist Assist for lower body dressing: Supervision/Verbal cueing     Toileting Toileting    Toileting assist Assist for toileting: Contact Guard/Touching assist     Transfers Chair/bed transfer  Transfers assist     Chair/bed transfer assist level: Contact Guard/Touching assist     Locomotion Ambulation   Ambulation assist  Assist level: Contact Guard/Touching assist Assistive device: No Device Max distance: 150   Walk 10 feet activity   Assist     Assist level: Contact Guard/Touching assist Assistive device: No Device   Walk 50 feet activity   Assist    Assist level: Contact Guard/Touching assist Assistive device: No Device    Walk 150 feet activity   Assist    Assist level: Contact Guard/Touching assist Assistive device: No Device    Walk 10 feet on uneven surface  activity   Assist     Assist level: Moderate Assistance - Patient - 50 - 74% Assistive device: Other (comment) (no device)   Wheelchair     Assist Will  patient use wheelchair at discharge?: No             Wheelchair 50 feet with 2 turns activity    Assist            Wheelchair 150 feet activity     Assist          Blood pressure 119/68, pulse 62, temperature 97.6 F (36.4 C), temperature source Oral, resp. rate 18, height 6\' 1"  (1.854 m), weight 92.9 kg, SpO2 93 %.  Medical Problem List and Plan: 1.  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  Planning loop recorder.             -patient may  Shower after covered loop recorder- pt really wants hair washed  2/16- first day of therapies- pt ready.  2/18- walking with OT/RW- doing great  2/24- s/p R shoulder steroid injection 2/22              -ELOS/Goals: 16-18 days mod I  -Continue CIR 2.  Antithrombotics: -DVT/anticoagulation: SCDs             -antiplatelet therapy: Continue Aspirin 81 mg daily and Plavix 75 mg day x3 weeks and then Plavix alone 3. Pain Management: Continue Tylenol as needed  2/20- Continue tramadol 50 mg q6 hours prn for pain 4. Mood: Trazodone 50 mg nightly, melatonin 3 mg nightly  2/24- con't tramadol and tylenol- might need to increase tramadol dose?  2/25- will add Lidoderm patches to R scapula for pain control  2/26- not sure yet if Lidoderm has helped- just got the patches. Will monitor  2/27- appears has helped- no complaints of pain today- con't regimen             -antipsychotic agents: N/A 5. Neuropsych: This patient is capable of making decisions on his own behalf. 6. Skin/Wound Care: Routine skin checks. Loop recorder soiled- dressing changed  2/26- looks great now- doesn't need dressing- a little bruised 7. Fluids/Electrolytes/Nutrition: Routine in and outs with follow-up chemistries 8.  Dysphagia.  Dysphagia #1 honey thick liquids.  Follow-up speech therapy. Will check Cr/BUN in Am- if significantly elevated, might need night IVFs for now Monitor hydration  2/16- BUN and Cr NOT  elevated so will con't to monitor 2x/week and add IVFs if cannot keep up his hydration with honey thick liquids.   2/17 MBS today to see if can upgrade diet/liquids  2/18- is now on nectar thick liquids- has been upgraded   2/21- water protocol- as well- BUN?CR not elevated. - con't regimen  2/24- now D1 thin liquids per MBS  2/25- tolerating thin liquids well- con't regimen 9.  Hyperlipidemia.  Lipitor 10.  Hypothyroidism.  Continue Tapazole 11.  Tobacco abuse.  NicoDerm patch.  Provide counseling-  smoked 2ppd- ok with Nicoderm  2/17- asked if could increase Patch- we cannot, but will add Gum if possible 12.  BPH.  Flomax 0.4 mg daily.  Check PVR 13.  GERD.  Protonix 14.  CAD with history of stenting.  Continue Plavix and aspirin 15. Thrush? Will try Diflucan 200 mg x 7 days. Might require nystatin swish and spit  2/16- says mouth feels a little better- con't Diflucan  2/17 - will add Nystatin swish and spit, since cannot swallow it.   2/18- plaque is coming off tongue- 50% gone as of today  2/21- doing better- con't regimen  2/22- Plaque isn't reducing in size anymore- but mouth/tongue less erythematous- con't regimen  2/28- won't send home on Nystatin- needs to brush tongue as well as teeth when he brushes.  16. Constipation- asked for Colace  2/21- will give Sorbitol x1 for constipation- con't regimen  2/22- LBM yesterday- con't regimen 17. Anxiety/depression  2/16- common in new stroke patients- will have pt seen by Neuropsych and start Paxil 10 mg QHS for both Sx's.  2/17- says worried might be "something else"- feels it in epigastric area? Doesn't sounds like GERD  2/18- will change to Celexa per pt request.  18. Muscle spasms  2/17- Will add Baclofen 5 mg QID for spasticity/muscle spasms  2/18- muscle spasms 50-75% better   2/25- muscle spasms in addition to muscle cramping of calves- spasticity spasms are resolved; no improvement in cramping- long/chronic issue- would suggest  Magnesium and start some to help.  19. Xerostomia: lip balm ordered to help moisturize 20.Marland Kitchen Blurry vision  2/21- is B/L per pt- will see how it goes- not DM to cause blurry vision- con't monitor  2/27- needs f/u with Ophthalmology when discharged for blurry vision 21/ R shoulder chronic pain  2/22- R shoulder injection today  2/24- bothering him again- suggested oral meds- can take up to 3 days for steroid to kick in  2/25- pain over scapula- will start lidoderm patch 22. Urinary retention  2/22- will start Flomax, but also check U/A and Cx to make sure not brewing UTI.  2/26- Will change Flomax to 0.8 mg nightly- might require a foley at d/c and sent to Dr Matilde Sprang, the neuro Urologist in town  2/27- needs f/u with Dr Matilde Sprang after d/c- he doesn't want foley, and PVRs are ~ 250-300 at most except 1 at 22- voided after that- con't Flomax at higher dose.   23. Rash on back/LUE  2/22- will start Cortisone cream TID.  2/24- resolved- now on L anterior arm- has patch- asked nursing to use cream there  2/25- improving- con't regimen 24. Dispo  2/16- will d/c IV for now- bothering pt.   2/18- will give B12 shot- is due and Tuesday will do R shoulder injection  2/26- d/c 3/1 planned- pt wants to stay longer 'until can pee"- I explained that we don't know if he WILL be able to void, but cannot keep "until he does".    2/27- f/u needed with Ophthalmology and Neuro-Urology- Dr Matilde Sprang.    LOS: 12 days A FACE TO FACE EVALUATION WAS PERFORMED  Aleah Ahlgrim 03/07/2020, 1:22 PM

## 2020-03-07 NOTE — Progress Notes (Signed)
Pt required intermittent cath. Pt unable to void after multiple attempts even while standing.        03/07/20 1505  Unmeasured Output  Urine Occurrence 1  Urine Characteristics  Urinary Incontinence No  Urine Color Amber  Urine Appearance Clear  Urinary Interventions Intermittent/Straight cath  Intermittent/Straight Cath (mL) 450 mL  Hygiene Peri care

## 2020-03-08 ENCOUNTER — Other Ambulatory Visit: Payer: Self-pay | Admitting: Physician Assistant

## 2020-03-08 LAB — CBC WITH DIFFERENTIAL/PLATELET
Abs Immature Granulocytes: 0.03 10*3/uL (ref 0.00–0.07)
Basophils Absolute: 0.1 10*3/uL (ref 0.0–0.1)
Basophils Relative: 1 %
Eosinophils Absolute: 0.4 10*3/uL (ref 0.0–0.5)
Eosinophils Relative: 4 %
HCT: 47.6 % (ref 39.0–52.0)
Hemoglobin: 16.6 g/dL (ref 13.0–17.0)
Immature Granulocytes: 0 %
Lymphocytes Relative: 28 %
Lymphs Abs: 2.8 10*3/uL (ref 0.7–4.0)
MCH: 29.6 pg (ref 26.0–34.0)
MCHC: 34.9 g/dL (ref 30.0–36.0)
MCV: 85 fL (ref 80.0–100.0)
Monocytes Absolute: 0.7 10*3/uL (ref 0.1–1.0)
Monocytes Relative: 7 %
Neutro Abs: 6.2 10*3/uL (ref 1.7–7.7)
Neutrophils Relative %: 60 %
Platelets: 148 10*3/uL — ABNORMAL LOW (ref 150–400)
RBC: 5.6 MIL/uL (ref 4.22–5.81)
RDW: 12.5 % (ref 11.5–15.5)
WBC: 10.3 10*3/uL (ref 4.0–10.5)
nRBC: 0 % (ref 0.0–0.2)

## 2020-03-08 LAB — BASIC METABOLIC PANEL
Anion gap: 7 (ref 5–15)
BUN: 13 mg/dL (ref 8–23)
CO2: 31 mmol/L (ref 22–32)
Calcium: 9.2 mg/dL (ref 8.9–10.3)
Chloride: 100 mmol/L (ref 98–111)
Creatinine, Ser: 1.13 mg/dL (ref 0.61–1.24)
GFR, Estimated: >60 mL/min (ref >60)
Glucose, Bld: 98 mg/dL (ref 70–99)
Potassium: 3.8 mmol/L (ref 3.5–5.1)
Sodium: 138 mmol/L (ref 135–145)

## 2020-03-08 LAB — GLUCOSE, CAPILLARY: Glucose-Capillary: 106 mg/dL — ABNORMAL HIGH (ref 70–99)

## 2020-03-08 MED ORDER — BACLOFEN 5 MG PO TABS: 5.0000 mg | ORAL_TABLET | Freq: Four times a day (QID) | ORAL | 0 refills | Status: DC

## 2020-03-08 MED ORDER — TRAMADOL HCL 50 MG PO TABS: 50.0000 mg | ORAL_TABLET | Freq: Four times a day (QID) | ORAL | 0 refills | Status: DC | PRN

## 2020-03-08 MED ORDER — ACETAMINOPHEN 325 MG PO TABS
650.0000 mg | ORAL_TABLET | ORAL | Status: DC | PRN
Start: 2020-03-08 — End: 2020-08-12

## 2020-03-08 MED ORDER — CLOPIDOGREL BISULFATE 75 MG PO TABS: 75.0000 mg | ORAL_TABLET | Freq: Every day | ORAL | 1 refills | Status: DC

## 2020-03-08 MED ORDER — LIDOCAINE 5 % EX PTCH: 1.0000 | MEDICATED_PATCH | CUTANEOUS | 0 refills | Status: DC

## 2020-03-08 MED ORDER — METHIMAZOLE 5 MG PO TABS: ORAL_TABLET | ORAL | 1 refills | Status: DC

## 2020-03-08 MED ORDER — DICLOFENAC SODIUM 1 % EX GEL: 4.0000 g | Freq: Four times a day (QID) | CUTANEOUS | 0 refills | Status: DC | PRN

## 2020-03-08 MED ORDER — MAGNESIUM OXIDE 400 (241.3 MG) MG PO TABS: 400.0000 mg | ORAL_TABLET | Freq: Every day | ORAL | 0 refills | Status: DC

## 2020-03-08 MED ORDER — SENNOSIDES-DOCUSATE SODIUM 8.6-50 MG PO TABS: 1.0000 | ORAL_TABLET | Freq: Two times a day (BID) | ORAL | Status: DC

## 2020-03-08 MED ORDER — NICOTINE 21 MG/24HR TD PT24: MEDICATED_PATCH | TRANSDERMAL | 0 refills | Status: DC

## 2020-03-08 MED ORDER — PANTOPRAZOLE SODIUM 40 MG PO TBEC: 40.0000 mg | DELAYED_RELEASE_TABLET | Freq: Every day | ORAL | 0 refills | Status: DC

## 2020-03-08 MED ORDER — MELATONIN 3 MG PO TABS: 3.0000 mg | ORAL_TABLET | Freq: Every day | ORAL | 0 refills | Status: DC

## 2020-03-08 MED ORDER — TAMSULOSIN HCL 0.4 MG PO CAPS
0.8000 mg | ORAL_CAPSULE | Freq: Every day | ORAL | 0 refills | Status: DC
Start: 2020-03-08 — End: 2020-11-02

## 2020-03-08 MED ORDER — CITALOPRAM HYDROBROMIDE 10 MG PO TABS: 10.0000 mg | ORAL_TABLET | Freq: Every day | ORAL | 0 refills | Status: DC

## 2020-03-08 MED ORDER — ATORVASTATIN CALCIUM 80 MG PO TABS: 80.0000 mg | ORAL_TABLET | Freq: Every day | ORAL | 3 refills | Status: DC

## 2020-03-08 MED ORDER — ALBUTEROL SULFATE HFA 108 (90 BASE) MCG/ACT IN AERS: 2.0000 | INHALATION_SPRAY | Freq: Four times a day (QID) | RESPIRATORY_TRACT | 0 refills | Status: DC | PRN

## 2020-03-08 MED FILL — PANTOPRAZOLE SOD DR 40 MG T: 40 | 30 days supply | Qty: 30 | Fill #0

## 2020-03-08 MED FILL — BACLOFEN 5 MG TABS: 5 | 28 days supply | Qty: 100 | Fill #0

## 2020-03-08 MED FILL — PROAIR HFA 90 MCG INHALER: 108 (90 BAS | 25 days supply | Qty: 9 | Fill #0

## 2020-03-08 MED FILL — CLOPIDOGREL 75 MG TABLET: 75 | 30 days supply | Qty: 30 | Fill #0

## 2020-03-08 MED FILL — MAGNESIUM OXIDE 400 MG TABS: 400 | 30 days supply | Qty: 30 | Fill #0

## 2020-03-08 MED FILL — CITALOPRAM HBR 10 MG TABLET: 10 | 30 days supply | Qty: 30 | Fill #0

## 2020-03-08 MED FILL — MELATONIN 3 MG TABS: 3 | 30 days supply | Qty: 30 | Fill #0

## 2020-03-08 MED FILL — ATORVASTATIN CALCIUM 80 MG: 80 | 30 days supply | Qty: 30 | Fill #0

## 2020-03-08 MED FILL — DICLOFENAC SODIUM 1 % GEL: 1 | 16 days supply | Qty: 200 | Fill #0

## 2020-03-08 MED FILL — traMADol HCL 50 MG TABS: 50 | 7 days supply | Qty: 30 | Fill #0

## 2020-03-08 NOTE — Progress Notes (Signed)
PROGRESS NOTE   Subjective/Complaints: Asks about his urinary retention Requests suppository LPN Ashley bladder scanning David Hendricks currently- 183 Has voided on his own several times today  ROS:  Pt denies SOB, abd pain, CP, N/V/C/D, and vision changes  Objective:   No results found. Recent Labs    03/08/20 0453  WBC 10.3  HGB 16.6  HCT 47.6  PLT 148*   Recent Labs    03/08/20 0453  NA 138  K 3.8  CL 100  CO2 31  GLUCOSE 98  BUN 13  CREATININE 1.13  CALCIUM 9.2    Intake/Output Summary (Last 24 hours) at 03/08/2020 1623 Last data filed at 03/08/2020 1453 Gross per 24 hour  Intake 780 ml  Output 1575 ml  Net -795 ml        Physical Exam: Vital Signs Blood pressure 126/74, pulse 80, temperature (!) 97.3 F (36.3 C), resp. rate 16, height 6\' 1"  (1.854 m), weight 92.9 kg, SpO2 91 %.   Gen: no distress, normal appearing HEENT: oral mucosa pink and moist, NCAT Cardio: Reg rate Chest: normal effort, normal rate of breathing Abd: soft, non-distended Ext: no edema Psych: pleasant, normal affect Skin: back rash resolved- L anterior shoulder rash improved- almost gone/resolved-stable today Musloskeletal: TTP over infraspinatus and scapula and supraspinatus. Has lidoderm patch in place x 15 minutes    Cervical back: Normal range of motion and neck supple. TTP over R RTC/shoulder    Comments: RUE 5/5 in biceps, triceps, WE, grip and finger abd LUE- 5-/5 except finger abd 4+/5 RLE- 5/5 in JF, KE, DF and PF LLE- 5-/5 in same muscles Neurological: Ox3- Hoffman's on LUE   Speech is mildly dysarthric but intelligible- mildly better today- can understand better Ox3- blurry vision, "even with eyeglasses".  Light touch intact x 4 extremities       Assessment/Plan: 1. Functional deficits which require 3+ hours per day of interdisciplinary therapy in a comprehensive inpatient rehab setting.  Physiatrist is  providing close team supervision and 24 hour management of active medical problems listed below.  Physiatrist and rehab team continue to assess barriers to discharge/monitor David Hendricks progress toward functional and medical goals  Care Tool:  Bathing    Body parts bathed by David Hendricks: Right arm,Left arm,Chest,Abdomen,Front perineal area,Buttocks,Right upper leg,Left upper leg,Right lower leg,Left lower leg,Face         Bathing assist Assist Level: Independent with assistive device     Upper Body Dressing/Undressing Upper body dressing   What is the David Hendricks wearing?: Pull over shirt    Upper body assist Assist Level: Independent    Lower Body Dressing/Undressing Lower body dressing      What is the David Hendricks wearing?: Pants     Lower body assist Assist for lower body dressing: Independent     Toileting Toileting    Toileting assist Assist for toileting: Independent     Transfers Chair/bed transfer  Transfers assist     Chair/bed transfer assist level: Contact Guard/Touching assist     Locomotion Ambulation   Ambulation assist      Assist level: Contact Guard/Touching assist Assistive device: No Device Max distance: 150   Walk 10 feet activity  Assist     Assist level: Contact Guard/Touching assist Assistive device: No Device   Walk 50 feet activity   Assist    Assist level: Contact Guard/Touching assist Assistive device: No Device    Walk 150 feet activity   Assist    Assist level: Contact Guard/Touching assist Assistive device: No Device    Walk 10 feet on uneven surface  activity   Assist     Assist level: Moderate Assistance - David Hendricks - 50 - 74% Assistive device: Other (comment) (no device)   Wheelchair     Assist Will David Hendricks use wheelchair at discharge?: No             Wheelchair 50 feet with 2 turns activity    Assist            Wheelchair 150 feet activity     Assist          Blood  pressure 126/74, pulse 80, temperature (!) 97.3 F (36.3 C), resp. rate 16, height 6\' 1"  (1.854 m), weight 92.9 kg, SpO2 91 %.  Medical Problem List and Plan: 1.  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  Planning loop recorder.             -David Hendricks may  Shower after covered loop recorder- pt really wants hair washed  2/16- first day of therapies- pt ready.  2/18- walking with OT/RW- doing great  2/24- s/p R shoulder steroid injection 2/22              -ELOS/Goals: 16-18 days mod I  -Continue CIR 2.  Antithrombotics: -DVT/anticoagulation: SCDs             -antiplatelet therapy: Continue Aspirin 81 mg daily and Plavix 75 mg day x3 weeks and then Plavix alone 3. Pain Management: Continue Tylenol as needed  2/20- Continue tramadol 50 mg q6 hours prn for pain 4. Mood: Trazodone 50 mg nightly, melatonin 3 mg nightly  2/24- con't tramadol and tylenol- might need to increase tramadol dose?  2/25- will add Lidoderm patches to R scapula for pain control  2/26- not sure yet if Lidoderm has helped- just got the patches. Will monitor  2/28- appears has helped- no complaints of pain today- continue regimen             -antipsychotic agents: N/A 5. Neuropsych: This David Hendricks is capable of making decisions on his own behalf. 6. Skin/Wound Care: Routine skin checks. Loop recorder soiled- dressing changed  2/26- looks great now- doesn't need dressing- a little bruised 7. Fluids/Electrolytes/Nutrition: Routine in and outs with follow-up chemistries 8.  Dysphagia.  Dysphagia #1 honey thick liquids.  Follow-up speech therapy. Will check Cr/BUN in Am- if significantly elevated, might need night IVFs for now Monitor hydration  2/16- BUN and Cr NOT elevated so will con't to monitor 2x/week and add IVFs if cannot keep up his hydration with honey thick liquids.   2/17 MBS today to see if can upgrade diet/liquids  2/18- is now on nectar thick liquids- has been  upgraded   2/21- water protocol- as well- BUN?CR not elevated. - con't regimen  2/24- now D1 thin liquids per MBS  2/25- tolerating thin liquids well- con't regimen 9.  Hyperlipidemia.  Lipitor 10.  Hypothyroidism.  Continue Tapazole 11.  Tobacco abuse.  NicoDerm patch.  Provide counseling- smoked 2ppd- ok with Nicoderm  2/17- asked if could increase Patch- we cannot, but will add Gum if possible 12.  BPH.  Flomax 0.4 mg daily.  Check PVR 13.  GERD.  Protonix 14.  CAD with history of stenting.  Continue Plavix and aspirin 15. Thrush? Will try Diflucan 200 mg x 7 days. Might require nystatin swish and spit  2/16- says mouth feels a little better- con't Diflucan  2/17 - will add Nystatin swish and spit, since cannot swallow it.   2/18- plaque is coming off tongue- 50% gone as of today  2/21- doing better- con't regimen  2/22- Plaque isn't reducing in size anymore- but mouth/tongue less erythematous- con't regimen  2/28- won't send home on Nystatin- needs to brush tongue as well as teeth when he brushes.  16. Constipation- asked for Colace  2/21- will give Sorbitol x1 for constipation- con't regimen  2/22- LBM yesterday- con't regimen  2/28: suppository today- discussed how this can contribute to urinary retention 17. Anxiety/depression  2/16- common in new stroke patients- will have pt seen by Neuropsych and start Paxil 10 mg QHS for both Sx's.  2/17- says worried might be "something else"- feels it in epigastric area? Doesn't sounds like GERD  2/18- will change to Celexa per pt request.  18. Muscle spasms  2/17- Will add Baclofen 5 mg QID for spasticity/muscle spasms  2/18- muscle spasms 50-75% better   2/25- muscle spasms in addition to muscle cramping of calves- spasticity spasms are resolved; no improvement in cramping- long/chronic issue- would suggest Magnesium and start some to help.  19. Xerostomia: lip balm ordered to help moisturize 20.Marland Kitchen Blurry vision  2/21- is B/L per pt-  will see how it goes- not DM to cause blurry vision- con't monitor  2/27- needs f/u with Ophthalmology when discharged for blurry vision 21/ R shoulder chronic pain  2/22- R shoulder injection today  2/24- bothering him again- suggested oral meds- can take up to 3 days for steroid to kick in  2/25- pain over scapula- will start lidoderm patch 22. Urinary retention  2/22- will start Flomax, but also check U/A and Cx to make sure not brewing UTI.  2/26- Will change Flomax to 0.8 mg nightly- might require a foley at d/c and sent to Dr Matilde Sprang, the neuro Urologist in town  2/27- needs f/u with Dr Matilde Sprang after d/c- he doesn't want foley, and PVRs are ~ 250-300 at most except 1 at 4- voided after that- con't Flomax at higher dose.   23. Rash on back/LUE  2/22- will start Cortisone cream TID.  2/24- resolved- now on L anterior arm- has patch- asked nursing to use cream there  2/25- improving- con't regimen 24. Dispo  2/16- will d/c IV for now- bothering pt.   2/18- will give B12 shot- is due and Tuesday will do R shoulder injection  2/26- d/c 3/1 planned- pt wants to stay longer 'until can pee"- I explained that we don't know if he WILL be able to void, but cannot keep "until he does".    2/27- f/u needed with Ophthalmology and Neuro-Urology- Dr Matilde Sprang.    LOS: 13 days A FACE TO FACE EVALUATION WAS PERFORMED  David Hendricks 03/08/2020, 4:23 PM

## 2020-03-08 NOTE — Progress Notes (Signed)
Family called with concern about pt driving after discharge. PA notified. Also, educated pt on operative MV after discharge. Pt in agreement. Sheela Stack, LPN

## 2020-03-08 NOTE — Progress Notes (Signed)
Occupational Therapy Session Note  Patient Details  Name: David Hendricks MRN: [id number] Date of Birth: 1950/06/08  Today's Date: 03/08/2020 OT Individual Time: 9471-2527 OT Individual Time Calculation (min): 44 min    Short Term Goals: Week 1:  OT Short Term Goal 1 (Week 1): Pt will complete LB bathe/dress close Supervision OT Short Term Goal 1 - Progress (Week 1): Met OT Short Term Goal 2 (Week 1): Pt will perform functional mobility in prep for ADL with LRAD w/ CS OT Short Term Goal 2 - Progress (Week 1): Met OT Short Term Goal 3 (Week 1): Pt will perform 3/3 toileting tasks with CS OT Short Term Goal 3 - Progress (Week 1): Met OT Short Term Goal 4 (Week 1): Pt will exhibit good safety awareness during ADL with no more than occasional cues OT Short Term Goal 4 - Progress (Week 1): Met Week 2:  OT Short Term Goal 1 (Week 2): STG=LTG 2/2 ELOS   Skilled Therapeutic Interventions/Progress Updates:    Pt greeted at time of session supine in bed no clothing, agreeable to OT session. Aware it is his grad day and going home tomorrow. Supine > sit Mod I and ambulated no AD Supervision for safety to ADL apartment, stopping half way for rest break. Once in ADL apartment, transferred to recliner Mod I before walking to bathroom with RW. Performed tub transfer with close supervision for safety and cues for hand placement but otherwise no cues needed and transferred to shower seat, able to step over tub wall. Educated on where to purchase shower seat if he does want and recommended that if pt is going to "soak" and take a bath at home that he has a friend or family member outside bathroom for safety PRN and he agreed. Needing to use abthroom at this time, walked back to room Mod I with RW and transferred to toilet same manner. Indep with hygiene before walking to bed. Sit > supine Indep and doffed clothes per his preference. Alarm on call bell in reach. Discussion throughout session for techniques at home for  at home food delivery, medication delivery, etc so not to drive.   Therapy Documentation Precautions:  Precautions Precautions: Fall Precaution Comments: Impulsive Restrictions Weight Bearing Restrictions: No     Therapy/Group: Individual Therapy  Viona Gilmore 03/08/2020, 8:55 AM

## 2020-03-08 NOTE — Discharge Summary (Signed)
Occupational Therapy Discharge Summary  Patient Details  Name: David Hendricks MRN: [id number] Date of Birth: 1951/01/02  Patient has met 52 of 42 long term goals due to improved activity tolerance, improved balance, ability to compensate for deficits, functional use of  LEFT upper extremity and improved coordination.  Patient to discharge at overall Modified Independent level.  Patient lives alone and is Mod I w/ ADLs at home, and he has been receptive to education for meal/grocery delivery to prevent driving, and friends to assist as well PRN to provide the necessary assistance at discharge.  Pt also has brother and sister in law that live in another state that will check on the pt PRN.  All goals met.   Recommendation:  Patient will benefit from ongoing skilled OT services in home health setting to continue to advance functional skills in the area of iADL and Vocation.  Equipment: No equipment provided. Pt educated that he needs to independently purchase a seat for his shower to maximize safety during bathing. Pt aware of where to purchase.   Reasons for discharge: treatment goals met and discharge from hospital  Patient/family agrees with progress made and goals achieved: Yes   OT Discharge Precautions/Restrictions  Precautions Precautions: Fall Pain Pain Assessment Pain Scale: 0-10 Pain Score: 0-No pain Pain Location: Shoulder Pain Orientation: Right Pain Intervention(s): Medication (See eMAR) ADL ADL Eating: Set up Grooming: Independent Where Assessed-Grooming: Sitting at sink Upper Body Bathing: Independent Where Assessed-Upper Body Bathing: Shower Lower Body Bathing: Independent Where Assessed-Lower Body Bathing: Shower Upper Body Dressing: Independent Where Assessed-Upper Body Dressing: Chair Lower Body Dressing: Independent Where Assessed-Lower Body Dressing: Chair Toileting: Independent Where Assessed-Toileting: Glass blower/designer: Programme researcher, broadcasting/film/video: Chief Financial Officer Method: Heritage manager: Gaffer Baseline Vision/History: Wears glasses Wears Glasses: Reading only Patient Visual Report: No change from baseline Perception  Perception: Within Functional Limits Praxis Praxis: Intact Cognition Overall Cognitive Status: Impaired/Different from baseline Arousal/Alertness: Awake/alert Orientation Level: Oriented X4 Attention: Selective Sustained Attention: Appears intact Selective Attention: Appears intact Memory: Impaired Memory Impairment: Retrieval deficit;Storage deficit Awareness: Appears intact Problem Solving: Impaired Problem Solving Impairment: Verbal complex;Functional complex Safety/Judgment: Impaired Comments: likely near baseline but would benefit from continued ST services Sensation Sensation Light Touch: Appears Intact Coordination Gross Motor Movements are Fluid and Coordinated: No Fine Motor Movements are Fluid and Coordinated: No Coordination and Movement Description: Pt still with mild balance impairments during dynamic standing with pt able to self correct unassisted, mild Lt hemi Motor  Motor Motor: Hemiplegia;Abnormal postural alignment and control Trunk/Postural Assessment  Postural Control Postural Control: Deficits on evaluation (Limited in standing)  Balance Balance Balance Assessed: Yes Dynamic Sitting Balance Dynamic Sitting - Balance Support: No upper extremity supported;Feet supported;During functional activity Dynamic Sitting - Level of Assistance: 7: Independent (donning gripper socks while sitting on TTB) Dynamic Standing Balance Dynamic Standing - Balance Support: During functional activity;No upper extremity supported Dynamic Standing - Level of Assistance: 7: Independent Dynamic Standing - Balance Activities: Lateral lean/weight shifting;Forward lean/weight shifting (LB dressing tasks) Extremity/Trunk  Assessment RUE Assessment RUE Assessment: Within Functional Limits LUE Assessment Active Range of Motion (AROM) Comments: Shoulder ROM WNL excluding shoulder internal rotation General Strength Comments: 4+/5 grossly   Michaela A Hoffman 03/08/2020, 11:43 AM

## 2020-03-08 NOTE — Progress Notes (Signed)
Speech Language Pathology Discharge Summary  Patient Details  Name: David Hendricks MRN: [id number] Date of Birth: 1950/08/14  Today's Date: 03/08/2020 SLP Individual Time: 1020-1105 SLP Individual Time Calculation (min): 45 min   Skilled Therapeutic Interventions:  Skilled ST services focused on education and swallow skills. SLP provided dys 1 diet handout and reviewed diet with pt and pt's sister in law via facetime. Pt's sister in law asked appropriate questions and is ordering a "magic bullet" to assist in purring foods. All questions were answered to satisfaction. Pt required supervision A verbal cues fade to mod I to utilize swallow strategies. Pt demonstrated recall of desired medication management system organizing bottles and SLP created a check off system to aid in recall of consumed medication. Pt demonstrated verbal recall of check off system instructions. Pt was left in room with call bell within reach and bed alarm set. SLP recommends to continue skilled services.    Patient has met 5 of 5 long term goals.  Patient to discharge at overall Modified Independent level.  Reasons goals not met:     Clinical Impression/Discharge Summary:   Pt made good progress meeting 5 out 5 goals, however skill level fluctuated from supervision A to mod I. Pt demonstrated improvement in swallow upgrading from nectar thick liquids to thin liquids, however remained on dys 1 textures due to significant pharyngeal residue (noted on MBS's). SLP recommends following solids with liquids and utilizing a throat clear and re-swallow every few bites to reduce residue. SLP recommends follow up ST utilize instrumental assessment to assess pharyngeal residue with solid upgrades. Pt was tolerating trials of occasional dys 2 textures. SLP questions pt's baseline cognitive abilities in complex problem solving, safety awareness and short term memory, although pt is adequate to discharge home alone, he would benefit from continue  ST services focusing on cognition and swallowing.  Care Partner:  Caregiver Able to Provide Assistance: No     Recommendation:  Home Health SLP  Rationale for SLP Follow Up: Maximize cognitive function and independence;Reduce caregiver burden;Maximize swallowing safety   Equipment: N/A   Reasons for discharge: Discharged from hospital   Patient/Family Agrees with Progress Made and Goals Achieved: Yes    Altamese Deguire 03/08/2020, 11:30 AM

## 2020-03-08 NOTE — Progress Notes (Signed)
Patient ID: David Hendricks, male   DOB: [birth year], 70 y.o.   MRN: [id number]  SW received fax from [employer] reporting referral sent on Friday was too early and will need to refax. SW faxed faxed [facility] referral to [facility] [phone]).   SW met with pt in room to get PCS home care agency preferences: 1) [place], 3) [place]. Also discussed discharge process. Pt reports that his friend is going to pick him up and he needs to leave at 10am because his friend needs to get ready for work. Preferred pharmacy is [facility]. Pt asked SW to call his sister.   SW returned phone call/left message for pt sister [name] [phone]).  *SW spoke with [name], she expressed concerns related to being a part of the d/c instructions, pt driving at d/c, and if pt will need to cath at d/c. SW informed concerns will be reported to medical team. SW also informed will have more updates on if cathing is needed tomorrow prior to d/c.  Sw spoke with Holly/RN with [employer] [phone]) to complete mini assessment. Pt declined as he did not require a lot of hands on assistance.   Loralee Pacas, MSW, [place] Office: [phone] Cell: [phone] Fax: [phone]

## 2020-03-08 NOTE — Progress Notes (Signed)
Physical Therapy Discharge Summary  Patient Details  Name: David Hendricks MRN: [id number] Date of Birth: 1950/09/19  Today's Date: 03/08/2020 PT Individual Time: 1345-1445 PT Individual Time Calculation (min): 60 min    Patient has met 4 of 9 long term goals due to improved activity tolerance, improved balance, improved postural control and improved awareness.  Patient to discharge at an ambulatory level supervision to modified independent .   Patient's care partner unavailable to provide the necessary cognitive assistance at discharge.  Reasons goals not met: Pt is physically capable of meeting mod I goals, but still requires supervision with some activities due to impulsivity and decreased safety awareness.   Recommendation:  Patient will benefit from ongoing skilled PT services in home health setting to continue to advance safe functional mobility, address ongoing impairments in balance, safety awareness, and endurance, and minimize fall risk.  Equipment: No equipment provided  Reasons for discharge: discharge from hospital  Patient/family agrees with progress made and goals achieved: Yes   Skilled Intervention:  Pt seated in recliner on arrival, agreeable to therapy but agitated due to learning he will need a foley catheter when he goes home. STS without AD mod I, gait 2 x 300 ft, 1 x 100 ft with supervision and no AD. Discussed safety strategies while walking, including taking standing breaks and reducing gait speed. Pt performed mod I car transfer without cueing. Pt demonstrated unsafe floor transfer, with increased speed and without using nearby furniture. Pt educated on safer floor transfer technique and demonstrated improved transfer, decreasing speed and using mat table to stand. Pt states he often sits on the ground if he needs to rest when taking out garbage cans. Stairs, 3 x 4 (6") with supervision. Pt had no physical difficulty, supervision required due to deceased safety awareness  and impulsivity. Strength, sensation, and balance testing were performed, see results below. Pt returned to room and was left supine in bed with alarm set and all needs in reach.  PT Discharge Precautions/Restrictions Precautions Precautions: Fall Precaution Comments: Impulsive Restrictions Weight Bearing Restrictions: No Vital Signs  Pain   Vision/Perception  Vision - History Baseline Vision: Wears glasses all the time Patient Visual Report: Blurring of vision Vision - Assessment Eye Alignment: Within Functional Limits Perception Perception: Within Functional Limits Praxis Praxis: Intact  Cognition Overall Cognitive Status: Impaired/Different from baseline Arousal/Alertness: Awake/alert Orientation Level: Oriented X4 Attention: Selective Sustained Attention: Appears intact Sustained Attention Impairment: Verbal basic;Verbal complex Selective Attention: Appears intact Memory: Impaired Memory Impairment: Decreased recall of new information Awareness: Impaired Awareness Impairment: Anticipatory impairment Problem Solving: Impaired Problem Solving Impairment: Verbal complex;Functional complex Behaviors: Restless;Impulsive;Poor frustration tolerance Safety/Judgment: Impaired Comments: likely near baseline but would benefit from continued ST services Sensation Sensation Light Touch: Appears Intact Proprioception: Appears Intact Coordination Gross Motor Movements are Fluid and Coordinated: No Fine Motor Movements are Fluid and Coordinated: No Coordination and Movement Description: Pt still with mild balance impairments during dynamic standing with pt able to self correct unassisted, mild Lt hemi Motor  Motor Motor: Hemiplegia;Abnormal postural alignment and control;Abnormal tone Motor - Skilled Clinical Observations: mild L hemi Motor - Discharge Observations: mild L hemi  Mobility Bed Mobility Bed Mobility: Rolling Right;Rolling Left;Supine to Sit;Sit to  Supine Rolling Right: Independent Rolling Left: Independent Supine to Sit: Independent Sit to Supine: Independent Transfers Transfers: Sit to [employer] Transfers Sit to Stand: Independent with assistive device (increased time) Stand Pivot Transfers: Independent with assistive device (increased time) Transfer (Assistive device): None Locomotion  Gait Ambulation:  Yes Gait Assistance: Supervision/Verbal cueing (increased time and VCs for safety) Gait Distance (Feet): 300 Feet Assistive device: None Gait Gait: Yes Gait Pattern: Impaired Gait Pattern: Decreased stride length;Decreased hip/knee flexion - right;Decreased hip/knee flexion - left;Shuffle;Right foot flat;Left foot flat;Trunk flexed;Poor foot clearance - left;Poor foot clearance - right Gait velocity: increased when anxious or tired, decreased with improved safety awareness Stairs / Additional Locomotion Stairs: Yes Stairs Assistance: Supervision/Verbal cueing (increased time, VCs for safety) Stair Management Technique: Two rails;Alternating pattern Number of Stairs: 12 Height of Stairs: 6 Wheelchair Mobility Wheelchair Mobility: No  Trunk/Postural Assessment  Cervical Assessment Cervical Assessment: Within Functional Limits Thoracic Assessment Thoracic Assessment: Within Functional Limits Lumbar Assessment Lumbar Assessment: Within Functional Limits Postural Control Postural Control: Deficits on evaluation Righting Reactions: delayed/impaired  Balance Balance Balance Assessed: Yes Standardized Balance Assessment Standardized Balance Assessment: Berg Balance Test Berg Balance Test Sit to Stand: Able to stand without using hands and stabilize independently Standing Unsupported: Able to stand safely 2 minutes Sitting with Back Unsupported but Feet Supported on Floor or Stool: Able to sit safely and securely 2 minutes Stand to Sit: Sits safely with minimal use of hands Transfers: Able to transfer safely,  minor use of hands Standing Unsupported with Eyes Closed: Able to stand 10 seconds safely Standing Ubsupported with Feet Together: Able to place feet together independently and stand 1 minute safely From Standing, Reach Forward with Outstretched Arm: Can reach forward >5 cm safely (2") From Standing Position, Pick up Object from Floor: Able to pick up shoe safely and easily From Standing Position, Turn to Look Behind Over each Shoulder: Looks behind from both sides and weight shifts well Turn 360 Degrees: Able to turn 360 degrees safely in 4 seconds or less Standing Unsupported, Alternately Place Feet on Step/Stool: Able to stand independently and safely and complete 8 steps in 20 seconds Standing Unsupported, One Foot in Front: Loses balance while stepping or standing Standing on One Leg: Tries to lift leg/unable to hold 3 seconds but remains standing independently Total Score: 47 Static Sitting Balance Static Sitting - Balance Support: Feet unsupported;No upper extremity supported Static Sitting - Level of Assistance: 6: Modified independent (Device/Increase time);7: Independent Static Sitting - Comment/# of Minutes: 5 min Dynamic Sitting Balance Dynamic Sitting - Balance Support: No upper extremity supported;Feet supported;During functional activity Dynamic Sitting - Level of Assistance: 7: Independent Dynamic Sitting - Balance Activities: Lateral lean/weight shifting;Forward lean/weight shifting;Reaching for objects Static Standing Balance Static Standing - Balance Support: No upper extremity supported Static Standing - Level of Assistance: 6: Modified independent (Device/Increase time) Static Standing - Comment/# of Minutes: 2 min Dynamic Standing Balance Dynamic Standing - Balance Support: During functional activity;No upper extremity supported Dynamic Standing - Level of Assistance: 6: Modified independent (Device/Increase time) Dynamic Standing - Balance Activities: Lateral  lean/weight shifting;Forward lean/weight shifting Extremity Assessment      RLE Assessment RLE Assessment: Within Functional Limits Passive Range of Motion (PROM) Comments: tight HS General Strength Comments: 5/5 grossly LLE Assessment LLE Assessment: Within Functional Limits Passive Range of Motion (PROM) Comments: tight HS General Strength Comments: 5/5 grossly    Dyane Dustman 03/08/2020, 6:12 PM

## 2020-03-08 NOTE — Discharge Instructions (Signed)
Inpatient Rehab Discharge Instructions  David Hendricks Discharge date and time: No discharge date for patient encounter.   Activities/Precautions/ Functional Status: Activity: activity as tolerated Diet: Dysphagia #1 thin liquids Wound Care: Routine skin checks Functional status:  ___ No restrictions     ___ Walk up steps independently ___ 24/7 supervision/assistance   ___ Walk up steps with assistance ___ Intermittent supervision/assistance  ___ Bathe/dress independently ___ Walk with walker     _x__ Bathe/dress with assistance ___ Walk Independently    ___ Shower independently ___ Walk with assistance    ___ Shower with assistance ___ No alcohol     ___ Return to work/school ________   COMMUNITY REFERRALS UPON DISCHARGE:    Home Health:   PT     OT    ST       SNA    SW                   Agency: [facility]       Phone: [phone] *Please expect follow-up within 2-3 days to schedule your home visit. If you have not received follow-up, be sure to contact the branch directly.*   Medical Equipment/Items Ordered: No equipment recommended                                                 Agency/Supplier: N/A      Special Instructions: No driving smoking or alcohol  Continue aspirin 81 mg daily x2 more days and stop   STROKE/TIA DISCHARGE INSTRUCTIONS SMOKING Cigarette smoking nearly doubles your risk of having a stroke & is the single most alterable risk factor  If you smoke or have smoked in the last 12 months, you are advised to quit smoking for your health.  Most of the excess cardiovascular risk related to smoking disappears within a year of stopping.  Ask you doctor about anti-smoking medications  [place] Quit Line: 1-800-QUIT NOW  Free Smoking Cessation Classes [phone]  CHOLESTEROL Know your levels; limit fat & cholesterol in your diet  Lipid Panel     Component Value Date/Time   CHOL 168 02/16/2020 0500   CHOL 106 12/04/2016 1029   TRIG 80 02/16/2020  0500   HDL 41 02/16/2020 0500   HDL 45 12/04/2016 1029   CHOLHDL 4.1 02/16/2020 0500   VLDL 16 02/16/2020 0500   LDLCALC 111 (H) 02/16/2020 0500   LDLCALC 38 12/04/2016 1029      Many patients benefit from treatment even if their cholesterol is at goal.  Goal: Total Cholesterol (CHOL) less than 160  Goal:  Triglycerides (TRIG) less than 150  Goal:  HDL greater than 40  Goal:  LDL (LDLCALC) less than 100   BLOOD PRESSURE American Stroke Association blood pressure target is less that 120/80 mm/Hg  Your discharge blood pressure is:     Monitor your blood pressure  Limit your salt and alcohol intake  Many individuals will require more than one medication for high blood pressure  DIABETES (A1c is a blood sugar average for last 3 months) Goal HGBA1c is under 7% (HBGA1c is blood sugar average for last 3 months)  Diabetes: No known diagnosis of diabetes    Lab Results  Component Value Date   HGBA1C 5.2 02/16/2020     Your HGBA1c can be lowered with medications, healthy diet, and exercise.  Check your blood sugar as directed by your physician  Call your physician if you experience unexplained or low blood sugars.  PHYSICAL ACTIVITY/REHABILITATION Goal is 30 minutes at least 4 days per week  Activity: Increase activity slowly, Therapies: Physical Therapy: Home Health Return to work:   Activity decreases your risk of heart attack and stroke and makes your heart stronger.  It helps control your weight and blood pressure; helps you relax and can improve your mood.  Participate in a regular exercise program.  Talk with your doctor about the best form of exercise for you (dancing, walking, swimming, cycling).  DIET/WEIGHT Goal is to maintain a healthy weight  Your discharge diet is:  Diet Order            DIET - DYS 1 Room service appropriate? No; Fluid consistency: Honey Thick  Diet effective now                 liquids Your height is:    Your current weight is:   Your  Body Mass Index (BMI) is:     Following the type of diet specifically designed for you will help prevent another stroke.  Your goal weight range is:    Your goal Body Mass Index (BMI) is 19-24.  Healthy food habits can help reduce 3 risk factors for stroke:  High cholesterol, hypertension, and excess weight.  RESOURCES Stroke/Support Group:  Call [phone]   STROKE EDUCATION PROVIDED/REVIEWED AND GIVEN TO PATIENT Stroke warning signs and symptoms How to activate emergency medical system (call 911). Medications prescribed at discharge. Need for follow-up after discharge. Personal risk factors for stroke. Pneumonia vaccine given:  Flu vaccine given:  My questions have been answered, the writing is legible, and I understand these instructions.  I will adhere to these goals & educational materials that have been provided to me after my discharge from the hospital.    Plavix 75 mg daily x3 weeks then Plavix alone.   My questions have been answered and I understand these instructions. I will adhere to these goals and the provided educational materials after my discharge from the hospital.  Patient/Caregiver Signature _______________________________ Date __________  Clinician Signature _______________________________________ Date __________  Please bring this form and your medication list with you to all your follow-up doctor's appointments.

## 2020-03-08 NOTE — Progress Notes (Addendum)
Inpatient Rehabilitation Care Coordinator Discharge Note  The overall goal for the admission was met for:   Discharge location: Yes. D/c to home.   Length of Stay: Yes. 13 days.   Discharge activity level: Yes. Supervision to Mod I.   Home/community participation: Yes. Limited.   Services provided included: MD, RD, PT, OT, SLP, RN, CM, TR, Pharmacy, Neuropsych and SW  Financial Services: Medicare and Medicaid  Choices offered to/list presented to:Yes  Follow-up services arranged: Home Health: [facility] for PT/OT/SLP/aide/SW and DME: No DME  Comments (or additional information):  Patient/Family verbalized understanding of follow-up arrangements: Yes  Individual responsible for coordination of the follow-up plan: contact pt [phone]   Confirmed correct DME delivered: Rana Snare 03/08/2020    Rana Snare

## 2020-03-08 NOTE — Progress Notes (Signed)
Occupational Therapy Session Note  Patient Details  Name: David Hendricks MRN: [id number] Date of Birth: 03-27-1950  Today's Date: 03/08/2020 OT Individual Time: 2482-5003 OT Individual Time Calculation (min): 56 min     Skilled Therapeutic Intervention:  Pt greeted in bed via SLP handoff. He was requesting to shower. Pt gathered needed items in room and bathroom, ambulating without AD. He then set up his own shower and bathed while seated on the TTB. After pt donned his gripper socks, he ambulated to a chair in the bathroom to proceed with dressing tasks at sit<stand level. Grooming tasks including shaving completed while seated at the sink unassisted. Pt reports he has a friend who will intermittently check in on him at home and assist him with driving needs. Pt aware that we strongly recommend that he purchases a seat for the shower but is reluctant to do so. He would like OPOT for his f/u. After completing toileting at independent level, he was escorted via w/c to the ADL apartment. Discussed kitchen safety including placing needed items within easy reach to avoid stooping and also to only use the oven with supervision from friends/family. Pt practiced kitchen mobility including making "simulated" breakfast, lunch, and dinner items while ambulating without AD. Pt reports only using the microwave at home for heating up his honeybuns and TV dinners. He only uses paper towels vs dishes for energy conservation. Pt was returned to room and transferred to the recliner, left him with all needs within reach and chair alarm set.   Therapy Documentation Precautions:  Precautions Precautions: Fall Precaution Comments: Impulsive Restrictions Weight Bearing Restrictions: No Pain: Pain Assessment Pain Score: 0-No pain ADL: ADL Eating: Set up Grooming: Independent Where Assessed-Grooming: Sitting at sink Upper Body Bathing: Independent Where Assessed-Upper Body Bathing: Shower Lower Body Bathing:  Independent Where Assessed-Lower Body Bathing: Shower Upper Body Dressing: Independent Where Assessed-Upper Body Dressing: Chair Lower Body Dressing: Independent Where Assessed-Lower Body Dressing: Chair Toileting: Independent Where Assessed-Toileting: Glass blower/designer: IT consultant: Chief Financial Officer Method: Heritage manager: Gaffer Baseline Vision/History: Wears glasses Wears Glasses: Reading only Patient Visual Report: No change from baseline Perception  Perception: Within Functional Limits Praxis Praxis: Intact      Therapy/Group: Individual Therapy  [name] A [name] 03/08/2020, 12:50 PM

## 2020-03-09 NOTE — Progress Notes (Signed)
PROGRESS NOTE   Subjective/Complaints:  Pt says he refuses to go home cathing OR with Foley- he thinks he empties "enough"  He wants meds to "fix the issues", but explained he's on the max dose of Flomax currently.   We make sure pt has f/u with Urology after d/c.    ROS:  Pt denies SOB, abd pain, CP, N/V/C/D, and vision changes   Objective:   No results found. Recent Labs    03/08/20 0453  WBC 10.3  HGB 16.6  HCT 47.6  PLT 148*   Recent Labs    03/08/20 0453  NA 138  K 3.8  CL 100  CO2 31  GLUCOSE 98  BUN 13  CREATININE 1.13  CALCIUM 9.2    Intake/Output Summary (Last 24 hours) at 03/09/2020 0904 Last data filed at 03/09/2020 0904 Gross per 24 hour  Intake 1340 ml  Output 1300 ml  Net 40 ml        Physical Exam: Vital Signs Blood pressure 125/65, pulse 63, temperature 98 F (36.7 C), resp. rate 20, height 6\' 1"  (1.854 m), weight 92.9 kg, SpO2 93 %.    General: awake, alert, appropriate, NAD- naked with sheet- sitting up in bed HENT: conjugate gaze; oropharynx moist CV: regular rate; no JVD Pulmonary: CTA B/L; no W/R/R- good air movement GI: soft, NT, ND, (+)BS Psychiatric: irritable Neurological: Ox3  Skin: rash on L shoulder and back resolved Musloskeletal: TTP over infraspinatus and scapula and supraspinatus. Has lidoderm patch in place x 15 minutes    Cervical back: Normal range of motion and neck supple. TTP over R RTC/shoulder    Comments: RUE 5/5 in biceps, triceps, WE, grip and finger abd LUE- 5-/5 except finger abd 4+/5 RLE- 5/5 in JF, KE, DF and PF LLE- 5-/5 in same muscles Neurological: Ox3- Hoffman's on LUE   Speech is mildly dysarthric but intelligible- mildly better today- can understand better Ox3- blurry vision, "even with eyeglasses".  Light touch intact x 4 extremities       Assessment/Plan: 1. Functional deficits which require 3+ hours per day of  interdisciplinary therapy in a comprehensive inpatient rehab setting.  Physiatrist is providing close team supervision and 24 hour management of active medical problems listed below.  Physiatrist and rehab team continue to assess barriers to discharge/monitor patient progress toward functional and medical goals  Care Tool:  Bathing    Body parts bathed by patient: Right arm,Left arm,Chest,Abdomen,Front perineal area,Buttocks,Right upper leg,Left upper leg,Right lower leg,Left lower leg,Face         Bathing assist Assist Level: Independent with assistive device     Upper Body Dressing/Undressing Upper body dressing   What is the patient wearing?: Pull over shirt    Upper body assist Assist Level: Independent    Lower Body Dressing/Undressing Lower body dressing      What is the patient wearing?: Pants     Lower body assist Assist for lower body dressing: Independent     Toileting Toileting    Toileting assist Assist for toileting: Independent     Transfers Chair/bed transfer  Transfers assist     Chair/bed transfer assist level: Independent with assistive device  Locomotion Ambulation   Ambulation assist      Assist level: Supervision/Verbal cueing Assistive device: No Device Max distance: 300   Walk 10 feet activity   Assist     Assist level: Supervision/Verbal cueing Assistive device: No Device   Walk 50 feet activity   Assist    Assist level: Supervision/Verbal cueing Assistive device: No Device    Walk 150 feet activity   Assist    Assist level: Supervision/Verbal cueing Assistive device: No Device    Walk 10 feet on uneven surface  activity   Assist     Assist level: Moderate Assistance - Patient - 50 - 74% Assistive device: Other (comment) (no device)   Wheelchair     Assist Will patient use wheelchair at discharge?: No             Wheelchair 50 feet with 2 turns activity    Assist             Wheelchair 150 feet activity     Assist          Blood pressure 125/65, pulse 63, temperature 98 F (36.7 C), resp. rate 20, height 6\' 1"  (1.854 m), weight 92.9 kg, SpO2 93 %.  Medical Problem List and Plan: 1.  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  Planning loop recorder.             -patient may  Shower after covered loop recorder- pt really wants hair washed  2/16- first day of therapies- pt ready.  2/18- walking with OT/RW- doing great  2/24- s/p R shoulder steroid injection 2/22              -ELOS/Goals: 16-18 days mod I  -Continue CIR 2.  Antithrombotics: -DVT/anticoagulation: SCDs             -antiplatelet therapy: Continue Aspirin 81 mg daily and Plavix 75 mg day x3 weeks and then Plavix alone 3. Pain Management: Continue Tylenol as needed  2/20- Continue tramadol 50 mg q6 hours prn for pain 4. Mood: Trazodone 50 mg nightly, melatonin 3 mg nightly  2/24- con't tramadol and tylenol- might need to increase tramadol dose?  2/25- will add Lidoderm patches to R scapula for pain control  2/26- not sure yet if Lidoderm has helped- just got the patches. Will monitor  2/28- appears has helped- no complaints of pain today- continue regimen             -antipsychotic agents: N/A 5. Neuropsych: This patient is capable of making decisions on his own behalf. 6. Skin/Wound Care: Routine skin checks. Loop recorder soiled- dressing changed  2/26- looks great now- doesn't need dressing- a little bruised 7. Fluids/Electrolytes/Nutrition: Routine in and outs with follow-up chemistries 8.  Dysphagia.  Dysphagia #1 honey thick liquids.  Follow-up speech therapy. Will check Cr/BUN in Am- if significantly elevated, might need night IVFs for now Monitor hydration  2/16- BUN and Cr NOT elevated so will con't to monitor 2x/week and add IVFs if cannot keep up his hydration with honey thick liquids.   2/17 MBS today to see if can upgrade  diet/liquids  2/18- is now on nectar thick liquids- has been upgraded   2/21- water protocol- as well- BUN?CR not elevated. - con't regimen  2/24- now D1 thin liquids per MBS  2/25- tolerating thin liquids well- con't regimen 9.  Hyperlipidemia.  Lipitor 10.  Hypothyroidism.  Continue Tapazole 11.  Tobacco abuse.  NicoDerm  patch.  Provide counseling- smoked 2ppd- ok with Nicoderm  2/17- asked if could increase Patch- we cannot, but will add Gum if possible 12.  BPH.  Flomax 0.4 mg daily.  Check PVR 13.  GERD.  Protonix 14.  CAD with history of stenting.  Continue Plavix and aspirin 15. Thrush? Will try Diflucan 200 mg x 7 days. Might require nystatin swish and spit  2/16- says mouth feels a little better- con't Diflucan  2/17 - will add Nystatin swish and spit, since cannot swallow it.   2/18- plaque is coming off tongue- 50% gone as of today  2/21- doing better- con't regimen  2/22- Plaque isn't reducing in size anymore- but mouth/tongue less erythematous- con't regimen  2/28- won't send home on Nystatin- needs to brush tongue as well as teeth when he brushes.  16. Constipation- asked for Colace  2/21- will give Sorbitol x1 for constipation- con't regimen  2/22- LBM yesterday- con't regimen  2/28: suppository today- discussed how this can contribute to urinary retention 17. Anxiety/depression  2/16- common in new stroke patients- will have pt seen by Neuropsych and start Paxil 10 mg QHS for both Sx's.  2/17- says worried might be "something else"- feels it in epigastric area? Doesn't sounds like GERD  2/18- will change to Celexa per pt request.  18. Muscle spasms  2/17- Will add Baclofen 5 mg QID for spasticity/muscle spasms  2/18- muscle spasms 50-75% better   2/25- muscle spasms in addition to muscle cramping of calves- spasticity spasms are resolved; no improvement in cramping- long/chronic issue- would suggest Magnesium and start some to help.  19. Xerostomia: lip balm ordered to  help moisturize 20.Marland Kitchen Blurry vision  2/21- is B/L per pt- will see how it goes- not DM to cause blurry vision- con't monitor  2/27- needs f/u with Ophthalmology when discharged for blurry vision 21/ R shoulder chronic pain  2/22- R shoulder injection today  2/24- bothering him again- suggested oral meds- can take up to 3 days for steroid to kick in  2/25- pain over scapula- will start lidoderm patch 22. Urinary retention  2/22- will start Flomax, but also check U/A and Cx to make sure not brewing UTI.  2/26- Will change Flomax to 0.8 mg nightly- might require a foley at d/c and sent to Dr Matilde Sprang, the neuro Urologist in town  2/27- needs f/u with Dr Matilde Sprang after d/c- he doesn't want foley, and PVRs are ~ 250-300 at most except 1 at 22- voided after that- con't Flomax at higher dose.    3/1- no caths today- of note, said "liked being cathed". As per Dispo as below  23. Rash on back/LUE  2/22- will start Cortisone cream TID.  2/24- resolved- now on L anterior arm- has patch- asked nursing to use cream there  2/25- improving- con't regimen  3/1- resolved 24. Dispo  2/16- will d/c IV for now- bothering pt.   2/18- will give B12 shot- is due and Tuesday will do R shoulder injection  2/26- d/c 3/1 planned- pt wants to stay longer 'until can pee"- I explained that we don't know if he WILL be able to void, but cannot keep "until he does".    2/27- f/u needed with Ophthalmology and Neuro-Urology- Dr Matilde Sprang.   3/1-- pt scared "will start smoking again"- I explained needs to con't patch and NOT smoke or has a high rate of another stroke. Explained that I would prefer him to go home cathing if doesn't empty-  he refused that- also refused foley- agreed will f/u with Neuro-Urology and see what happens with that. I've explained increases risk of UTIs and long term, Dialysis, but he's upset with "whole discussion".    LOS: 14 days A FACE TO FACE EVALUATION WAS PERFORMED  Megan  Lovorn 03/09/2020, 9:04 AM

## 2020-03-11 ENCOUNTER — Telehealth: Payer: Self-pay | Admitting: *Deleted

## 2020-03-11 DIAGNOSIS — I639 Cerebral infarction, unspecified: Secondary | ICD-10-CM | POA: Diagnosis not present

## 2020-03-11 NOTE — Telephone Encounter (Signed)
Transition Care Management Unsuccessful Follow-up Telephone Call  Date of discharge and from where: Inpatient Rehab @ [facility] 03/09/2020  Attempts:  1st Attempt  Reason for unsuccessful TCM follow-up call:  Left voice message

## 2020-03-13 DIAGNOSIS — I69391 Dysphagia following cerebral infarction: Secondary | ICD-10-CM | POA: Diagnosis not present

## 2020-03-13 DIAGNOSIS — K219 Gastro-esophageal reflux disease without esophagitis: Secondary | ICD-10-CM | POA: Diagnosis not present

## 2020-03-13 DIAGNOSIS — E039 Hypothyroidism, unspecified: Secondary | ICD-10-CM | POA: Diagnosis not present

## 2020-03-13 DIAGNOSIS — B370 Candidal stomatitis: Secondary | ICD-10-CM | POA: Diagnosis not present

## 2020-03-13 DIAGNOSIS — Z7902 Long term (current) use of antithrombotics/antiplatelets: Secondary | ICD-10-CM | POA: Diagnosis not present

## 2020-03-13 DIAGNOSIS — M1990 Unspecified osteoarthritis, unspecified site: Secondary | ICD-10-CM | POA: Diagnosis not present

## 2020-03-13 DIAGNOSIS — F17210 Nicotine dependence, cigarettes, uncomplicated: Secondary | ICD-10-CM | POA: Diagnosis not present

## 2020-03-13 DIAGNOSIS — R918 Other nonspecific abnormal finding of lung field: Secondary | ICD-10-CM | POA: Diagnosis not present

## 2020-03-13 DIAGNOSIS — R1312 Dysphagia, oropharyngeal phase: Secondary | ICD-10-CM | POA: Diagnosis not present

## 2020-03-13 DIAGNOSIS — E785 Hyperlipidemia, unspecified: Secondary | ICD-10-CM | POA: Diagnosis not present

## 2020-03-13 DIAGNOSIS — R3915 Urgency of urination: Secondary | ICD-10-CM | POA: Diagnosis not present

## 2020-03-13 DIAGNOSIS — M25511 Pain in right shoulder: Secondary | ICD-10-CM | POA: Diagnosis not present

## 2020-03-13 DIAGNOSIS — K5900 Constipation, unspecified: Secondary | ICD-10-CM | POA: Diagnosis not present

## 2020-03-13 DIAGNOSIS — G8929 Other chronic pain: Secondary | ICD-10-CM | POA: Diagnosis not present

## 2020-03-13 DIAGNOSIS — N189 Chronic kidney disease, unspecified: Secondary | ICD-10-CM | POA: Diagnosis not present

## 2020-03-13 DIAGNOSIS — F419 Anxiety disorder, unspecified: Secondary | ICD-10-CM | POA: Diagnosis not present

## 2020-03-13 DIAGNOSIS — I2510 Atherosclerotic heart disease of native coronary artery without angina pectoris: Secondary | ICD-10-CM | POA: Diagnosis not present

## 2020-03-13 DIAGNOSIS — Z79891 Long term (current) use of opiate analgesic: Secondary | ICD-10-CM | POA: Diagnosis not present

## 2020-03-13 DIAGNOSIS — J449 Chronic obstructive pulmonary disease, unspecified: Secondary | ICD-10-CM | POA: Diagnosis not present

## 2020-03-13 DIAGNOSIS — F32A Depression, unspecified: Secondary | ICD-10-CM | POA: Diagnosis not present

## 2020-03-13 DIAGNOSIS — I252 Old myocardial infarction: Secondary | ICD-10-CM | POA: Diagnosis not present

## 2020-03-13 DIAGNOSIS — I69352 Hemiplegia and hemiparesis following cerebral infarction affecting left dominant side: Secondary | ICD-10-CM | POA: Diagnosis not present

## 2020-03-13 DIAGNOSIS — E0590 Thyrotoxicosis, unspecified without thyrotoxic crisis or storm: Secondary | ICD-10-CM | POA: Diagnosis not present

## 2020-03-13 DIAGNOSIS — N401 Enlarged prostate with lower urinary tract symptoms: Secondary | ICD-10-CM | POA: Diagnosis not present

## 2020-03-13 DIAGNOSIS — I700 Atherosclerosis of aorta: Secondary | ICD-10-CM | POA: Diagnosis not present

## 2020-03-15 ENCOUNTER — Telehealth: Payer: Self-pay | Admitting: *Deleted

## 2020-03-15 DIAGNOSIS — J449 Chronic obstructive pulmonary disease, unspecified: Secondary | ICD-10-CM | POA: Diagnosis not present

## 2020-03-15 DIAGNOSIS — I69352 Hemiplegia and hemiparesis following cerebral infarction affecting left dominant side: Secondary | ICD-10-CM | POA: Diagnosis not present

## 2020-03-15 DIAGNOSIS — B370 Candidal stomatitis: Secondary | ICD-10-CM | POA: Diagnosis not present

## 2020-03-15 DIAGNOSIS — I69391 Dysphagia following cerebral infarction: Secondary | ICD-10-CM | POA: Diagnosis not present

## 2020-03-15 DIAGNOSIS — R1312 Dysphagia, oropharyngeal phase: Secondary | ICD-10-CM | POA: Diagnosis not present

## 2020-03-15 DIAGNOSIS — I2510 Atherosclerotic heart disease of native coronary artery without angina pectoris: Secondary | ICD-10-CM | POA: Diagnosis not present

## 2020-03-15 NOTE — Telephone Encounter (Signed)
Deneise Lever PT [facility] called for POC approval of 1wk2, 2wk2, 1wk3  And to ask for a SN to evaluate meds and teaching and a visit from MSW to assist with need for transportation. Approval given.

## 2020-03-16 ENCOUNTER — Telehealth: Payer: Self-pay | Admitting: *Deleted

## 2020-03-16 DIAGNOSIS — I2510 Atherosclerotic heart disease of native coronary artery without angina pectoris: Secondary | ICD-10-CM | POA: Diagnosis not present

## 2020-03-16 DIAGNOSIS — I69391 Dysphagia following cerebral infarction: Secondary | ICD-10-CM | POA: Diagnosis not present

## 2020-03-16 DIAGNOSIS — R1312 Dysphagia, oropharyngeal phase: Secondary | ICD-10-CM | POA: Diagnosis not present

## 2020-03-16 DIAGNOSIS — B370 Candidal stomatitis: Secondary | ICD-10-CM | POA: Diagnosis not present

## 2020-03-16 DIAGNOSIS — I69352 Hemiplegia and hemiparesis following cerebral infarction affecting left dominant side: Secondary | ICD-10-CM | POA: Diagnosis not present

## 2020-03-16 DIAGNOSIS — J449 Chronic obstructive pulmonary disease, unspecified: Secondary | ICD-10-CM | POA: Diagnosis not present

## 2020-03-16 NOTE — Telephone Encounter (Signed)
Transitional Care call--I spoke with [name]    1. Are you/is patient experiencing any problems since coming home? Are there any questions regarding any aspect of care? No 2. Are there any questions regarding medications administration/dosing? Are meds being taken as prescribed? Patient should review meds with caller to confirm  He has everything except sennakot but he has something else he is using OTC and he has no issues. 3. Have there been any falls? NO 4. Has Home Health been to the house and/or have they contacted you? If not, have you tried to contact them? Can we help you contact them? Yes they have been out. He mentioned one said he needed a cane.  I have instructed him to tell them to notify us if something is needed. 5. Are bowels and bladder emptying properly? Are there any unexpected incontinence issues? If applicable, is patient following bowel/bladder programs? NO PROBLEMS 6. Any fevers, problems with breathing, unexpected pain? NO 7. Are there any skin problems or new areas of breakdown? NO 8. Has the patient/family member arranged specialty MD follow up (ie cardiology/neurology/renal/surgical/etc)?  Can we help arrange? Appt has been given for our office and he has his neuro appt scheduled. He is still to call for his cardiologist. He asked about transportation and he has a Marine scientist coming today from [place].  Hosp discharge note from MSW says MSW ordered to come out from [place]. I told him to discuss this with the person coming out today so that can be adressed. 9. Does the patient need any other services or support that we can help arrange? No 10. Are caregivers following through as expected in assisting the patient? He has a friend who is bringing him groceries when needed 11. Has the patient quit smoking, drinking alcohol, or using drugs as recommended?Yes  Appointment scheduled for Tuesday 03/23/20 @ 11:00 arrive by 10:30 to see NP Danella Sensing Alerted to look for packet in mail  from our office and fill out before visit. [place]

## 2020-03-16 NOTE — Telephone Encounter (Signed)
During the [facility] call David Hendricks mentioned he was promised someone would address his vision. This is not a new development since discharge but new since his stroke.  He has not seen [place] yet until 3/15.

## 2020-03-16 NOTE — Telephone Encounter (Signed)
Yes, I mentioned to him he would need to see Neurology and Ophthomology after his d/c from hospital.  I asked for Pa's to get him a f/u with both- if they didn't, will have to have David Hendricks address at f/u.   ML

## 2020-03-17 ENCOUNTER — Telehealth: Payer: Self-pay

## 2020-03-17 ENCOUNTER — Telehealth: Payer: Self-pay | Admitting: *Deleted

## 2020-03-17 DIAGNOSIS — R1312 Dysphagia, oropharyngeal phase: Secondary | ICD-10-CM | POA: Diagnosis not present

## 2020-03-17 DIAGNOSIS — I69391 Dysphagia following cerebral infarction: Secondary | ICD-10-CM | POA: Diagnosis not present

## 2020-03-17 DIAGNOSIS — B370 Candidal stomatitis: Secondary | ICD-10-CM | POA: Diagnosis not present

## 2020-03-17 DIAGNOSIS — I2510 Atherosclerotic heart disease of native coronary artery without angina pectoris: Secondary | ICD-10-CM | POA: Diagnosis not present

## 2020-03-17 DIAGNOSIS — J449 Chronic obstructive pulmonary disease, unspecified: Secondary | ICD-10-CM | POA: Diagnosis not present

## 2020-03-17 DIAGNOSIS — I69352 Hemiplegia and hemiparesis following cerebral infarction affecting left dominant side: Secondary | ICD-10-CM | POA: Diagnosis not present

## 2020-03-17 NOTE — Telephone Encounter (Signed)
Hospital discharge notes reviewed: Verbal approval given to [facility] PT with [facility]. To provide occupational therapy: once a week for 3 weeks, one week off, then once a week for one week. To improve ADL's and exercise.  (Call back phone number [phone]).

## 2020-03-17 NOTE — Telephone Encounter (Signed)
Cecille Rubin, RN, [facility] left a message asking for verbal orders for [facility] to come out to the patients house, 1wk8, for medication mgmt/disease mgmt. Medical record reviewed. Social work note reviewed.  Verbal orders given per office protocol.

## 2020-03-18 DIAGNOSIS — J449 Chronic obstructive pulmonary disease, unspecified: Secondary | ICD-10-CM | POA: Diagnosis not present

## 2020-03-18 DIAGNOSIS — I69352 Hemiplegia and hemiparesis following cerebral infarction affecting left dominant side: Secondary | ICD-10-CM | POA: Diagnosis not present

## 2020-03-18 DIAGNOSIS — I69391 Dysphagia following cerebral infarction: Secondary | ICD-10-CM | POA: Diagnosis not present

## 2020-03-18 DIAGNOSIS — B370 Candidal stomatitis: Secondary | ICD-10-CM | POA: Diagnosis not present

## 2020-03-18 DIAGNOSIS — I2510 Atherosclerotic heart disease of native coronary artery without angina pectoris: Secondary | ICD-10-CM | POA: Diagnosis not present

## 2020-03-18 DIAGNOSIS — R1312 Dysphagia, oropharyngeal phase: Secondary | ICD-10-CM | POA: Diagnosis not present

## 2020-03-19 ENCOUNTER — Telehealth: Payer: Self-pay

## 2020-03-19 NOTE — Telephone Encounter (Signed)
Cecille Rubin from [place] request nursing orders for patient. Called given approval, ask to call back and give frequency.

## 2020-03-23 ENCOUNTER — Other Ambulatory Visit: Payer: Self-pay

## 2020-03-23 ENCOUNTER — Encounter: Payer: Medicare Other | Attending: Registered Nurse | Admitting: Registered Nurse

## 2020-03-23 VITALS — BP 127/78 | HR 82 | Temp 98.1°F | Ht 73.0 in | Wt 215.0 lb

## 2020-03-23 DIAGNOSIS — E785 Hyperlipidemia, unspecified: Secondary | ICD-10-CM | POA: Diagnosis not present

## 2020-03-23 DIAGNOSIS — I69352 Hemiplegia and hemiparesis following cerebral infarction affecting left dominant side: Secondary | ICD-10-CM | POA: Diagnosis not present

## 2020-03-23 DIAGNOSIS — H538 Other visual disturbances: Secondary | ICD-10-CM | POA: Diagnosis not present

## 2020-03-23 DIAGNOSIS — I63511 Cerebral infarction due to unspecified occlusion or stenosis of right middle cerebral artery: Secondary | ICD-10-CM | POA: Diagnosis not present

## 2020-03-23 DIAGNOSIS — I2510 Atherosclerotic heart disease of native coronary artery without angina pectoris: Secondary | ICD-10-CM | POA: Diagnosis not present

## 2020-03-23 DIAGNOSIS — F32A Depression, unspecified: Secondary | ICD-10-CM

## 2020-03-23 DIAGNOSIS — R1312 Dysphagia, oropharyngeal phase: Secondary | ICD-10-CM | POA: Diagnosis not present

## 2020-03-23 DIAGNOSIS — B370 Candidal stomatitis: Secondary | ICD-10-CM | POA: Diagnosis not present

## 2020-03-23 DIAGNOSIS — M25511 Pain in right shoulder: Secondary | ICD-10-CM | POA: Diagnosis not present

## 2020-03-23 DIAGNOSIS — F419 Anxiety disorder, unspecified: Secondary | ICD-10-CM | POA: Diagnosis not present

## 2020-03-23 DIAGNOSIS — I69391 Dysphagia following cerebral infarction: Secondary | ICD-10-CM | POA: Diagnosis not present

## 2020-03-23 DIAGNOSIS — J449 Chronic obstructive pulmonary disease, unspecified: Secondary | ICD-10-CM | POA: Diagnosis not present

## 2020-03-23 NOTE — Progress Notes (Signed)
Subjective:    Patient ID: David Hendricks, male    DOB: March 04, 1950, 70 y.o.   MRN: [id number]  HPI: David Hendricks is a 70 y.o. male who is here for Transitional Care Visit for follow up of his Right Middle Cerebral Artery Stroke, Blurry Vision , Dyslipidemia, Right Shoulder Pain and Anxiety/ Depression. He presented to [facility] Emergency Room via EMS on 02/06/2022with complaints of slurred speech and left sided weakness. Neurology Consulted.  CT Head WO Contrast:  IMPRESSION: 1. No evidence of acute large vascular territory infarct.ASPECTS is 10. 2. Moderate to advanced patchy white matter hypoattenuation appears similar to prior and presumably related to chronic microvascular ischemic disease. A small white matter infarct could be obscured and MRI could better evaluate for acute ischemia if clinically indicated. CT Angio: Head: Neck IMPRESSION: 1. 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 2. Otherwise, no large vessel occlusion or proximal hemodynamically significant stenosis in the head or neck 3. Diminutive vertebrobasilar system intracranially with bilateral fetal type PCAs, anatomic variant. 4. Enlarged, heterogeneous thyroid with numerous nodules and retrosternal extension. This may reflect goiter; however, recommend thyroid ultrasound to further evaluate. 5. Paraseptal and centrilobular emphysema. MR Brain WO Contrast:  IMPRESSION: 1. Acute cortical and subcortical infarct in the high right posterior frontal lobe. Small focus of susceptibility artifact in this region may represent a small thrombosed distal cortical MCA branch. 2. Possible additional punctate infarct in the right parietal cortex versus artifact. 3. No substantial edema or mass effect. 4. Moderate to advanced chronic microvascular ischemic disease. David Hendricks was maintained on aspirin and Plavix for CVA prophylaxis. Loop Recorder was placed on  02/20/2020 by Dr Quentin Ore.  David Hendricks was admitted to inpatient rehabilitation on 02/24/2020 and discharged home on 03/09/2020. [facility]. He states he has pain in his right shoulder and lower back . He rates his pain 2. Also reports he has a good appetite.   David Hendricks reports he has blurry vision since his CVA and asked about opthalmology consult, Referral was placed to Dr Frederico Hamman, he verbalizes understanding.   David Hendricks expressed concerned about his sexual intimacy not being able to have an erection. The above will be discussed with Dr Dagoberto Ligas and this provider will give him a call, he verbalizes understanding.      Pain Inventory Average Pain 2 Pain Right Now 2 My pain is aching  LOCATION OF PAIN  Neck, back  BOWEL Number of stools per week: 2 Oral laxative use Yes  Type of laxative senokot Enema or suppository use No  History of colostomy No  Incontinent No   BLADDER Normal In and out cath, frequency na Able to self cath na Bladder incontinence No  Frequent urination No  Leakage with coughing No  Difficulty starting stream No  Incomplete bladder emptying No    Mobility walk with assistance  Function retired  Neuro/Psych trouble walking spasms  Prior Studies TC appt  Physicians involved in your care TC appt   Family History  Problem Relation Age of Onset  . Alcohol abuse Father        called [place] per pt  . Cancer Father        OAK CELL CANCER PER PT NON SURGICAL   . Colon cancer Neg Hx   . Colon polyps Neg Hx   . Esophageal cancer Neg Hx   . Rectal cancer Neg Hx   .  Stomach cancer Neg Hx    Social History   Socioeconomic History  . Marital status: Divorced    Spouse name: Not on file  . Number of children: 0  . Years of education: Not on file  . Highest education level: Not on file  Occupational History  . Occupation: retired Mudlogger  Tobacco Use  . Smoking status: Current  Every Day Smoker    Packs/day: 2.00    Years: 45.00    Pack years: 90.00    Types: Cigarettes    Start date: 01/09/1961  . Smokeless tobacco: Never Used  . Tobacco comment: tobacco info given  Vaping Use  . Vaping Use: Never used  Substance and Sexual Activity  . Alcohol use: No  . Drug use: No  . Sexual activity: Not on file  Other Topics Concern  . Not on file  Social History Narrative  . Not on file   Social Determinants of Health   Financial Resource Strain: Not on file  Food Insecurity: Not on file  Transportation Needs: Not on file  Physical Activity: Not on file  Stress: Not on file  Social Connections: Not on file   Past Surgical History:  Procedure Laterality Date  . COLONOSCOPY    . CORONARY ANGIOPLASTY  10/09/2016  . CORONARY STENT INTERVENTION N/A 10/09/2016   Procedure: CORONARY STENT INTERVENTION;  Surgeon: Nelva Bush, MD;  Location: [facility];  Service: Cardiovascular;  Laterality: N/A;  . CYSTOSCOPY WITH RETROGRADE PYELOGRAM, URETEROSCOPY AND STENT PLACEMENT Left 06/17/2019   Procedure: CYSTOSCOPY WITH RETROGRADE PYELOGRAM, URETEROSCOPY AND STENT PLACEMENT;  Surgeon: Robley Fries, MD;  Location: [facility];  Service: Urology;  Laterality: Left;  1 HR  . HOLMIUM LASER APPLICATION Left 6/0/4540   Procedure: HOLMIUM LASER APPLICATION;  Surgeon: Robley Fries, MD;  Location: [facility];  Service: Urology;  Laterality: Left;  . INTRAVASCULAR ULTRASOUND/IVUS N/A 10/09/2016   Procedure: Intravascular Ultrasound/IVUS;  Surgeon: Nelva Bush, MD;  Location: [facility];  Service: Cardiovascular;  Laterality: N/A;  . IR CHOLANGIOGRAM EXISTING TUBE  11/18/2018  . IR PERC CHOLECYSTOSTOMY  11/16/2018  . LEFT HEART CATH AND CORONARY ANGIOGRAPHY N/A 10/09/2016   Procedure: LEFT HEART CATH AND CORONARY ANGIOGRAPHY;  Surgeon: Nelva Bush, MD;  Location: [facility];  Service: Cardiovascular;  Laterality: N/A;  . LOOP RECORDER INSERTION N/A  02/20/2020   Procedure: LOOP RECORDER INSERTION;  Surgeon: Vickie Epley, MD;  Location: [facility];  Service: Cardiovascular;  Laterality: N/A;   Past Medical History:  Diagnosis Date  . Allergy   . Anxiety   . Aortic atherosclerosis ([place])   . Arthritis   . Benign prostatic hyperplasia (BPH) with urinary urgency   . Chronic kidney disease    KIDNEY STONES  . COPD (chronic obstructive pulmonary disease) ([place])    possible   . Coronary artery disease    10/18 PCI/DES to pLAD, mild nonobstructive disease in the Lcx/RCA. Normal EF.   Marland Kitchen Depression   . Dyslipidemia   . Dysrhythmia 2016   irregular heartbeat  . Gallstones   . GERD (gastroesophageal reflux disease)   . History of kidney stones   . Hyperlipidemia   . Hyperthyroidism   . Leg cramps   . Multiple pulmonary nodules   . Myocardial infarction ([place])    2018  . Pneumonia   . Seasonal allergies   . Thyroid nodule   . Vertigo   . Vitamin B 12 deficiency  BP 127/78   Pulse 82   Temp 98.1 F (36.7 C)   Ht 6\' 1"  (1.854 m)   Wt 215 lb (97.5 kg)   SpO2 93%   BMI 28.37 kg/m   Opioid Risk Score:   Fall Risk Score:  `1  Depression screen PHQ 2/9  Depression screen [facility] 2/9 03/23/2020 06/02/2019 02/06/2019 10/30/2018 06/21/2017 05/10/2017 08/24/2016  Decreased Interest 0 0 0 0 2 0 0  Down, Depressed, Hopeless 0 0 0 0 0 0 0  PHQ - 2 Score 0 0 0 0 2 0 0  Altered sleeping 1 - - - 3 - -  Tired, decreased energy 0 - - - 3 - -  Change in appetite 0 - - - 0 - -  Feeling bad or failure about yourself  0 - - - 3 - -  Trouble concentrating 0 - - - 0 - -  Moving slowly or fidgety/restless 0 - - - 0 - -  Suicidal thoughts 0 - - - 0 - -  PHQ-9 Score 1 - - - 11 - -  Difficult doing work/chores Not difficult at all - - - - - -      Review of Systems  Musculoskeletal: Positive for back pain, gait problem and neck pain.       Spasms  All other systems reviewed and are negative.      Objective:   Physical  Exam Vitals and nursing note reviewed.  Constitutional:      Appearance: Normal appearance.  Cardiovascular:     Rate and Rhythm: Normal rate and regular rhythm.     Pulses: Normal pulses.     Heart sounds: Normal heart sounds.  Pulmonary:     Effort: Pulmonary effort is normal.     Breath sounds: Normal breath sounds.  Musculoskeletal:     Cervical back: Normal range of motion and neck supple.     Comments: Normal Muscle Bulk and Muscle Testing Reveals:  Upper Extremities:Right: Decreased ROM 90 Degrees and Muscle Strength 5/5 Right AC Joint Tenderness Left: Full  ROM and Muscle Strength 4/5 Lower Extremities: Full ROM and Muscle Strength 5/5 Arises from Table with ease Narrow Based  Gait   Skin:    General: Skin is warm and dry.  Neurological:     Mental Status: He is alert and oriented to person, place, and time.  Psychiatric:        Mood and Affect: Mood normal.        Behavior: Behavior normal.           Assessment & Plan:  1.Right Middle Cerebral Artery Stroke: Continue Home Health Therapy with [facility]. He has a scheduled appointment with Neurology. Continue current medication regimen.  2.  Blurry Vision: RX: Referral to Dr Frederico Hamman. No Driving. Continue to monitor.  3.  Dyslipidemia: Continue current medication regimen. Continue to monitor.  4. Right Shoulder Pain: Continue HEP as Tolerated. Continue to Monitor.  5. Anxiety/ Depression.Continue current medication regimen. PCP Following.   F/U with Dr Dagoberto Ligas in 4- 6 weeks.

## 2020-03-24 DIAGNOSIS — I69391 Dysphagia following cerebral infarction: Secondary | ICD-10-CM | POA: Diagnosis not present

## 2020-03-24 DIAGNOSIS — I2510 Atherosclerotic heart disease of native coronary artery without angina pectoris: Secondary | ICD-10-CM | POA: Diagnosis not present

## 2020-03-24 DIAGNOSIS — R1312 Dysphagia, oropharyngeal phase: Secondary | ICD-10-CM | POA: Diagnosis not present

## 2020-03-24 DIAGNOSIS — J449 Chronic obstructive pulmonary disease, unspecified: Secondary | ICD-10-CM | POA: Diagnosis not present

## 2020-03-24 DIAGNOSIS — B370 Candidal stomatitis: Secondary | ICD-10-CM | POA: Diagnosis not present

## 2020-03-24 DIAGNOSIS — I69352 Hemiplegia and hemiparesis following cerebral infarction affecting left dominant side: Secondary | ICD-10-CM | POA: Diagnosis not present

## 2020-03-25 DIAGNOSIS — J449 Chronic obstructive pulmonary disease, unspecified: Secondary | ICD-10-CM | POA: Diagnosis not present

## 2020-03-25 DIAGNOSIS — B370 Candidal stomatitis: Secondary | ICD-10-CM | POA: Diagnosis not present

## 2020-03-25 DIAGNOSIS — I69391 Dysphagia following cerebral infarction: Secondary | ICD-10-CM | POA: Diagnosis not present

## 2020-03-25 DIAGNOSIS — I2510 Atherosclerotic heart disease of native coronary artery without angina pectoris: Secondary | ICD-10-CM | POA: Diagnosis not present

## 2020-03-25 DIAGNOSIS — I69352 Hemiplegia and hemiparesis following cerebral infarction affecting left dominant side: Secondary | ICD-10-CM | POA: Diagnosis not present

## 2020-03-25 DIAGNOSIS — R1312 Dysphagia, oropharyngeal phase: Secondary | ICD-10-CM | POA: Diagnosis not present

## 2020-03-26 ENCOUNTER — Telehealth: Payer: Self-pay | Admitting: Registered Nurse

## 2020-03-26 DIAGNOSIS — J449 Chronic obstructive pulmonary disease, unspecified: Secondary | ICD-10-CM | POA: Diagnosis not present

## 2020-03-26 DIAGNOSIS — I69391 Dysphagia following cerebral infarction: Secondary | ICD-10-CM | POA: Diagnosis not present

## 2020-03-26 DIAGNOSIS — B370 Candidal stomatitis: Secondary | ICD-10-CM | POA: Diagnosis not present

## 2020-03-26 DIAGNOSIS — I69352 Hemiplegia and hemiparesis following cerebral infarction affecting left dominant side: Secondary | ICD-10-CM | POA: Diagnosis not present

## 2020-03-26 DIAGNOSIS — R1312 Dysphagia, oropharyngeal phase: Secondary | ICD-10-CM | POA: Diagnosis not present

## 2020-03-26 DIAGNOSIS — I2510 Atherosclerotic heart disease of native coronary artery without angina pectoris: Secondary | ICD-10-CM | POA: Diagnosis not present

## 2020-03-26 NOTE — Telephone Encounter (Signed)
This provider spoke with Dr Dagoberto Ligas regarding David Hendricks question regarding his ability to have erection during sexual intimacy. Dr Dagoberto Ligas stated she recommends no sexual relation for 1 moth post CVA, it has been > 1 month. Since he's still having the above issue, Dr Dagoberto Ligas recommended for David Hendricks to keep his appointment with her. He verbalizes understanding.

## 2020-03-29 ENCOUNTER — Ambulatory Visit (INDEPENDENT_AMBULATORY_CARE_PROVIDER_SITE_OTHER): Payer: Medicare Other

## 2020-03-29 DIAGNOSIS — B370 Candidal stomatitis: Secondary | ICD-10-CM | POA: Diagnosis not present

## 2020-03-29 DIAGNOSIS — I69391 Dysphagia following cerebral infarction: Secondary | ICD-10-CM | POA: Diagnosis not present

## 2020-03-29 DIAGNOSIS — I639 Cerebral infarction, unspecified: Secondary | ICD-10-CM | POA: Diagnosis not present

## 2020-03-29 DIAGNOSIS — I2510 Atherosclerotic heart disease of native coronary artery without angina pectoris: Secondary | ICD-10-CM | POA: Diagnosis not present

## 2020-03-29 DIAGNOSIS — R1312 Dysphagia, oropharyngeal phase: Secondary | ICD-10-CM | POA: Diagnosis not present

## 2020-03-29 DIAGNOSIS — I69352 Hemiplegia and hemiparesis following cerebral infarction affecting left dominant side: Secondary | ICD-10-CM | POA: Diagnosis not present

## 2020-03-29 DIAGNOSIS — J449 Chronic obstructive pulmonary disease, unspecified: Secondary | ICD-10-CM | POA: Diagnosis not present

## 2020-03-29 LAB — CUP PACEART REMOTE DEVICE CHECK
Date Time Interrogation Session: 20220320161758
Implantable Pulse Generator Implant Date: 20220211

## 2020-03-30 ENCOUNTER — Telehealth: Payer: Self-pay

## 2020-03-30 DIAGNOSIS — J449 Chronic obstructive pulmonary disease, unspecified: Secondary | ICD-10-CM | POA: Diagnosis not present

## 2020-03-30 DIAGNOSIS — I69352 Hemiplegia and hemiparesis following cerebral infarction affecting left dominant side: Secondary | ICD-10-CM | POA: Diagnosis not present

## 2020-03-30 DIAGNOSIS — R1312 Dysphagia, oropharyngeal phase: Secondary | ICD-10-CM

## 2020-03-30 DIAGNOSIS — I69391 Dysphagia following cerebral infarction: Secondary | ICD-10-CM | POA: Diagnosis not present

## 2020-03-30 DIAGNOSIS — B370 Candidal stomatitis: Secondary | ICD-10-CM | POA: Diagnosis not present

## 2020-03-30 DIAGNOSIS — I2510 Atherosclerotic heart disease of native coronary artery without angina pectoris: Secondary | ICD-10-CM | POA: Diagnosis not present

## 2020-03-30 NOTE — Telephone Encounter (Signed)
David Hendricks with [facility] called to request a Modified Barium Study, at [facility], for aspiration risk.  Call back ph [phone].

## 2020-03-30 NOTE — Telephone Encounter (Signed)
Patient informed. 

## 2020-03-30 NOTE — Telephone Encounter (Signed)
I have ordered the Modified barium swallow at [facility]- and spoke to [place]- I directed the order to call me if they have questions- thank you- please let pt know he will get a call to schedule it- thank you, ML

## 2020-03-31 ENCOUNTER — Other Ambulatory Visit: Payer: Self-pay | Admitting: Physical Medicine and Rehabilitation

## 2020-03-31 ENCOUNTER — Telehealth: Payer: Self-pay | Admitting: *Deleted

## 2020-03-31 DIAGNOSIS — I700 Atherosclerosis of aorta: Secondary | ICD-10-CM

## 2020-03-31 DIAGNOSIS — F32A Depression, unspecified: Secondary | ICD-10-CM | POA: Diagnosis not present

## 2020-03-31 DIAGNOSIS — I69391 Dysphagia following cerebral infarction: Secondary | ICD-10-CM | POA: Diagnosis not present

## 2020-03-31 DIAGNOSIS — M25511 Pain in right shoulder: Secondary | ICD-10-CM | POA: Diagnosis not present

## 2020-03-31 DIAGNOSIS — I69352 Hemiplegia and hemiparesis following cerebral infarction affecting left dominant side: Secondary | ICD-10-CM | POA: Diagnosis not present

## 2020-03-31 DIAGNOSIS — E0590 Thyrotoxicosis, unspecified without thyrotoxic crisis or storm: Secondary | ICD-10-CM

## 2020-03-31 DIAGNOSIS — K219 Gastro-esophageal reflux disease without esophagitis: Secondary | ICD-10-CM

## 2020-03-31 DIAGNOSIS — F419 Anxiety disorder, unspecified: Secondary | ICD-10-CM | POA: Diagnosis not present

## 2020-03-31 DIAGNOSIS — R1312 Dysphagia, oropharyngeal phase: Secondary | ICD-10-CM | POA: Diagnosis not present

## 2020-03-31 DIAGNOSIS — R918 Other nonspecific abnormal finding of lung field: Secondary | ICD-10-CM

## 2020-03-31 DIAGNOSIS — N189 Chronic kidney disease, unspecified: Secondary | ICD-10-CM | POA: Diagnosis not present

## 2020-03-31 DIAGNOSIS — N401 Enlarged prostate with lower urinary tract symptoms: Secondary | ICD-10-CM

## 2020-03-31 DIAGNOSIS — E785 Hyperlipidemia, unspecified: Secondary | ICD-10-CM | POA: Diagnosis not present

## 2020-03-31 DIAGNOSIS — E039 Hypothyroidism, unspecified: Secondary | ICD-10-CM

## 2020-03-31 DIAGNOSIS — G8929 Other chronic pain: Secondary | ICD-10-CM | POA: Diagnosis not present

## 2020-03-31 DIAGNOSIS — J449 Chronic obstructive pulmonary disease, unspecified: Secondary | ICD-10-CM | POA: Diagnosis not present

## 2020-03-31 DIAGNOSIS — F17210 Nicotine dependence, cigarettes, uncomplicated: Secondary | ICD-10-CM

## 2020-03-31 DIAGNOSIS — B370 Candidal stomatitis: Secondary | ICD-10-CM | POA: Diagnosis not present

## 2020-03-31 DIAGNOSIS — I2510 Atherosclerotic heart disease of native coronary artery without angina pectoris: Secondary | ICD-10-CM | POA: Diagnosis not present

## 2020-03-31 DIAGNOSIS — R3915 Urgency of urination: Secondary | ICD-10-CM

## 2020-03-31 MED ORDER — BACLOFEN 5 MG PO TABS: 5.0000 mg | ORAL_TABLET | Freq: Four times a day (QID) | ORAL | 5 refills | Status: DC

## 2020-03-31 MED ORDER — CLOPIDOGREL BISULFATE 75 MG PO TABS: 75.0000 mg | ORAL_TABLET | Freq: Every day | ORAL | 1 refills | Status: DC

## 2020-03-31 MED ORDER — CITALOPRAM HYDROBROMIDE 10 MG PO TABS: 10.0000 mg | ORAL_TABLET | Freq: Every day | ORAL | 5 refills | Status: DC

## 2020-03-31 NOTE — Telephone Encounter (Signed)
His PCP is sometime soon- can refill baclofen and Citalopram with 5 refills- but Melatonin over the counter- and Plaix- can give 2 months, to make sure gets in with PCP- they need to refill after that. Thanks, ML

## 2020-03-31 NOTE — Telephone Encounter (Signed)
Mary RN notified, and refills sent to pharmacy.

## 2020-03-31 NOTE — Telephone Encounter (Signed)
Mary RN from [facility] called and says that David Hendricks was released recently from a SNF(?_ he was discharged from inpt rehab 03/09/20) and she is reporting that he needs refills on his melatonin, clopidogrel, citalopram, and his baclofen.  He saw Zella Ball 03/23/20 and has not yet seen the neurologist (appt 4/4 w/ Frann Rider NP)  He has appt with Dr Dagoberto Ligas 04/19/20 and Cardiology 04/22/20. I do not know when he sees his PCP. Should we refill these medications?

## 2020-04-02 ENCOUNTER — Telehealth: Payer: Self-pay

## 2020-04-02 DIAGNOSIS — R1312 Dysphagia, oropharyngeal phase: Secondary | ICD-10-CM | POA: Diagnosis not present

## 2020-04-02 DIAGNOSIS — J449 Chronic obstructive pulmonary disease, unspecified: Secondary | ICD-10-CM | POA: Diagnosis not present

## 2020-04-02 DIAGNOSIS — I2510 Atherosclerotic heart disease of native coronary artery without angina pectoris: Secondary | ICD-10-CM | POA: Diagnosis not present

## 2020-04-02 DIAGNOSIS — B370 Candidal stomatitis: Secondary | ICD-10-CM | POA: Diagnosis not present

## 2020-04-02 DIAGNOSIS — I69391 Dysphagia following cerebral infarction: Secondary | ICD-10-CM | POA: Diagnosis not present

## 2020-04-02 DIAGNOSIS — I69352 Hemiplegia and hemiparesis following cerebral infarction affecting left dominant side: Secondary | ICD-10-CM | POA: Diagnosis not present

## 2020-04-02 NOTE — Telephone Encounter (Signed)
I suggest Senna to try and get him to go- and if "down low" then would use suppositories. If cannot go in next 24 hours, try Magnesium Citrate from [facility]- is ~$1- try cherry flavor- tastes better- try 1/2 bottle, then if not enough, finish bottle.  Thanks, ML

## 2020-04-02 NOTE — Telephone Encounter (Signed)
Stanton Kidney, RN from [facility] called stating patient is having constipation and abdominal pain. He has suppositories in home and may try that and has been taking stool softner. Please advise.

## 2020-04-02 NOTE — Telephone Encounter (Signed)
Mary notified and she will let patient know.

## 2020-04-05 ENCOUNTER — Telehealth: Payer: Self-pay | Admitting: *Deleted

## 2020-04-05 ENCOUNTER — Other Ambulatory Visit: Payer: Self-pay

## 2020-04-05 ENCOUNTER — Emergency Department (HOSPITAL_COMMUNITY)
Admission: EM | Admit: 2020-04-05 | Discharge: 2020-04-05 | Disposition: A | Discharge status: Home / Self Care | Payer: Medicare Other | Attending: Emergency Medicine | Admitting: Emergency Medicine

## 2020-04-05 ENCOUNTER — Emergency Department (HOSPITAL_COMMUNITY): Payer: Medicare Other

## 2020-04-05 ENCOUNTER — Encounter (HOSPITAL_COMMUNITY): Payer: Self-pay

## 2020-04-05 DIAGNOSIS — I2510 Atherosclerotic heart disease of native coronary artery without angina pectoris: Secondary | ICD-10-CM | POA: Insufficient documentation

## 2020-04-05 DIAGNOSIS — F17210 Nicotine dependence, cigarettes, uncomplicated: Secondary | ICD-10-CM | POA: Insufficient documentation

## 2020-04-05 DIAGNOSIS — R1084 Generalized abdominal pain: Secondary | ICD-10-CM | POA: Diagnosis not present

## 2020-04-05 DIAGNOSIS — Z7982 Long term (current) use of aspirin: Secondary | ICD-10-CM | POA: Diagnosis not present

## 2020-04-05 DIAGNOSIS — Z9049 Acquired absence of other specified parts of digestive tract: Secondary | ICD-10-CM | POA: Diagnosis not present

## 2020-04-05 DIAGNOSIS — K5900 Constipation, unspecified: Secondary | ICD-10-CM

## 2020-04-05 DIAGNOSIS — R1312 Dysphagia, oropharyngeal phase: Secondary | ICD-10-CM | POA: Diagnosis not present

## 2020-04-05 DIAGNOSIS — I69352 Hemiplegia and hemiparesis following cerebral infarction affecting left dominant side: Secondary | ICD-10-CM | POA: Diagnosis not present

## 2020-04-05 DIAGNOSIS — Z7902 Long term (current) use of antithrombotics/antiplatelets: Secondary | ICD-10-CM | POA: Diagnosis not present

## 2020-04-05 DIAGNOSIS — N189 Chronic kidney disease, unspecified: Secondary | ICD-10-CM | POA: Insufficient documentation

## 2020-04-05 DIAGNOSIS — I700 Atherosclerosis of aorta: Secondary | ICD-10-CM | POA: Diagnosis not present

## 2020-04-05 DIAGNOSIS — J449 Chronic obstructive pulmonary disease, unspecified: Secondary | ICD-10-CM | POA: Diagnosis not present

## 2020-04-05 DIAGNOSIS — N200 Calculus of kidney: Secondary | ICD-10-CM | POA: Diagnosis not present

## 2020-04-05 DIAGNOSIS — I69391 Dysphagia following cerebral infarction: Secondary | ICD-10-CM | POA: Diagnosis not present

## 2020-04-05 DIAGNOSIS — R140 Abdominal distension (gaseous): Secondary | ICD-10-CM | POA: Diagnosis not present

## 2020-04-05 DIAGNOSIS — B370 Candidal stomatitis: Secondary | ICD-10-CM | POA: Diagnosis not present

## 2020-04-05 HISTORY — DX: Cerebral infarction, unspecified: I63.9

## 2020-04-05 LAB — CBC
HCT: 51.1 % (ref 39.0–52.0)
Hemoglobin: 17 g/dL (ref 13.0–17.0)
MCH: 29.1 pg (ref 26.0–34.0)
MCHC: 33.3 g/dL (ref 30.0–36.0)
MCV: 87.4 fL (ref 80.0–100.0)
Platelets: 162 10*3/uL (ref 150–400)
RBC: 5.85 MIL/uL — ABNORMAL HIGH (ref 4.22–5.81)
RDW: 12.5 % (ref 11.5–15.5)
WBC: 9.8 10*3/uL (ref 4.0–10.5)
nRBC: 0 % (ref 0.0–0.2)

## 2020-04-05 LAB — URINALYSIS, ROUTINE W REFLEX MICROSCOPIC
Bilirubin Urine: NEGATIVE
Glucose, UA: NEGATIVE mg/dL
Hgb urine dipstick: NEGATIVE
Ketones, ur: NEGATIVE mg/dL
Leukocytes,Ua: NEGATIVE
Nitrite: NEGATIVE
Protein, ur: NEGATIVE mg/dL
Specific Gravity, Urine: 1.033 — ABNORMAL HIGH (ref 1.005–1.030)
pH: 7 (ref 5.0–8.0)

## 2020-04-05 LAB — COMPREHENSIVE METABOLIC PANEL
ALT: 22 U/L (ref 0–44)
AST: 18 U/L (ref 15–41)
Albumin: 4.1 g/dL (ref 3.5–5.0)
Alkaline Phosphatase: 118 U/L (ref 38–126)
Anion gap: 7 (ref 5–15)
BUN: 13 mg/dL (ref 8–23)
CO2: 27 mmol/L (ref 22–32)
Calcium: 9 mg/dL (ref 8.9–10.3)
Chloride: 104 mmol/L (ref 98–111)
Creatinine, Ser: 1.05 mg/dL (ref 0.61–1.24)
GFR, Estimated: >60 mL/min (ref >60)
Glucose, Bld: 95 mg/dL (ref 70–99)
Potassium: 4.4 mmol/L (ref 3.5–5.1)
Sodium: 138 mmol/L (ref 135–145)
Total Bilirubin: 1.1 mg/dL (ref 0.3–1.2)
Total Protein: 7.2 g/dL (ref 6.5–8.1)

## 2020-04-05 LAB — LIPASE, BLOOD: Lipase: 39 U/L (ref 11–51)

## 2020-04-05 MED ORDER — DICYCLOMINE HCL 20 MG PO TABS: 20.0000 mg | ORAL_TABLET | Freq: Two times a day (BID) | ORAL | 0 refills | Status: DC

## 2020-04-05 MED ORDER — IOHEXOL 300 MG/ML  SOLN
100.0000 mL | Freq: Once | INTRAMUSCULAR | Status: AC | PRN
  Administered 2020-04-05: 100 mL via INTRAVENOUS

## 2020-04-05 MED ORDER — DICYCLOMINE HCL 10 MG/ML IM SOLN
20.0000 mg | Freq: Once | INTRAMUSCULAR | Status: AC
  Administered 2020-04-05: 20 mg via INTRAMUSCULAR
  Filled 2020-04-05: qty 2

## 2020-04-05 MED ORDER — MINERAL OIL RE ENEM
1.0000 | ENEMA | Freq: Once | RECTAL | Status: AC
  Administered 2020-04-05: 1 via RECTAL
  Filled 2020-04-05 (×2): qty 1

## 2020-04-05 NOTE — Telephone Encounter (Signed)
As we discussed, I would send pt to urgent care or ER to be checked for bowel obstruction via xray of abdomen- thank you, ML

## 2020-04-05 NOTE — ED Provider Notes (Signed)
[facility] DEPT Provider Note   CSN: [id number] Arrival date & time: 04/05/20  1058     History Chief Complaint  Patient presents with  . Constipation    David Hendricks is a 70 y.o. male.  HPI   Patient with significant medical history of CKD, COPD, CAD, stroke presents with chief complaint of constipation.  He endorses he has been unable to have a bowel movement since Friday, he states that he has generalized abdominal pain, not worsen with p.o.  He states he has felt nauseous and had one episode of vomiting yesterday, he has no history of bowel obstructions, no significant abdominal surgical history, no history of stomach ulcers, pancreatitis, diverticulitis.  Patient spoke to his primary care doctor who had him try senna, laxatives and suppositories which have been unsuccessful.  He has been trying a liquid diet for the last day and a half without any relief.  He endorses he still passing flatus but has not had any bowel movements or diarrhea.  Patient denies headaches, fevers, chills, shortness of breath, chest pain, worsening pedal edema.  Past Medical History:  Diagnosis Date  . Allergy   . Anxiety   . Aortic atherosclerosis ([place])   . Arthritis   . Benign prostatic hyperplasia (BPH) with urinary urgency   . Chronic kidney disease    KIDNEY STONES  . COPD (chronic obstructive pulmonary disease) ([place])    possible   . Coronary artery disease    10/18 PCI/DES to pLAD, mild nonobstructive disease in the Lcx/RCA. Normal EF.   Marland Kitchen Depression   . Dyslipidemia   . Dysrhythmia 2016   irregular heartbeat  . Gallstones   . GERD (gastroesophageal reflux disease)   . History of kidney stones   . Hyperlipidemia   . Hyperthyroidism   . Leg cramps   . Multiple pulmonary nodules   . Myocardial infarction ([place])    2018  . Pneumonia   . Seasonal allergies   . Stroke ([place])   . Thyroid nodule   . Vertigo   . Vitamin B 12 deficiency     Patient Active  Problem List   Diagnosis Date Noted  . 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 Dysphagia following cerebrovascular accident 02/17/2020  . Dysarthria due to recent cerebral infarction 02/17/2020  . Obesity 02/16/2020  . Hyperbilirubinemia 02/16/2020  . CVA (cerebral vascular accident) ([place]) 02/15/2020  . Thyroid mass 02/15/2020  . Anxiety and depression 02/15/2020  . Pulmonary emphysema ([place]) 06/02/2019  . Aortic atherosclerosis ([place]) 06/02/2019  . Smokers' cough ([place]) 06/02/2019  . 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  . Multinodular goiter 12/24/2018  . Subclinical hyperthyroidism 12/24/2018  . BPH (benign prostatic hyperplasia) 11/20/2018  . Pulmonary nodules 11/15/2018  . Esophageal thickening 11/15/2018  . Cigarette nicotine dependence without complication 39/03/90  . Old MI (myocardial infarction) 01/30/2017  . CAD (coronary artery disease) 01/30/2017  . Hyperlipidemia 10/10/2016  . History of non-ST elevation myocardial infarction (NSTEMI) 10/09/2016  . Tobacco abuse 08/28/2016  . Difficulty sleeping 04/22/2016  . Benign prostatic hyperplasia with urinary frequency 02/11/2016    Past Surgical History:  Procedure Laterality Date  . COLONOSCOPY    . CORONARY ANGIOPLASTY  10/09/2016  . CORONARY STENT INTERVENTION N/A 10/09/2016   Procedure: CORONARY STENT INTERVENTION;  Surgeon: Nelva Bush, MD;  Location: [facility];  Service: Cardiovascular;  Laterality: N/A;  . CYSTOSCOPY WITH RETROGRADE PYELOGRAM, URETEROSCOPY AND STENT PLACEMENT Left 06/17/2019   Procedure: CYSTOSCOPY WITH RETROGRADE  PYELOGRAM, URETEROSCOPY AND STENT PLACEMENT;  Surgeon: Robley Fries, MD;  Location: [facility];  Service: Urology;  Laterality: Left;  1 HR  . HOLMIUM LASER APPLICATION Left 07/14/6431   Procedure: HOLMIUM LASER APPLICATION;  Surgeon: Robley Fries, MD;  Location: [facility];  Service: Urology;  Laterality: Left;  . INTRAVASCULAR ULTRASOUND/IVUS N/A 10/09/2016    Procedure: Intravascular Ultrasound/IVUS;  Surgeon: Nelva Bush, MD;  Location: [facility];  Service: Cardiovascular;  Laterality: N/A;  . IR CHOLANGIOGRAM EXISTING TUBE  11/18/2018  . IR PERC CHOLECYSTOSTOMY  11/16/2018  . LEFT HEART CATH AND CORONARY ANGIOGRAPHY N/A 10/09/2016   Procedure: LEFT HEART CATH AND CORONARY ANGIOGRAPHY;  Surgeon: Nelva Bush, MD;  Location: [facility];  Service: Cardiovascular;  Laterality: N/A;  . LOOP RECORDER INSERTION N/A 02/20/2020   Procedure: LOOP RECORDER INSERTION;  Surgeon: Vickie Epley, MD;  Location: [facility];  Service: Cardiovascular;  Laterality: N/A;       Family History  Problem Relation Age of Onset  . Alcohol abuse Father        called [place] per pt  . Cancer Father        OAK CELL CANCER PER PT NON SURGICAL   . Colon cancer Neg Hx   . Colon polyps Neg Hx   . Esophageal cancer Neg Hx   . Rectal cancer Neg Hx   . Stomach cancer Neg Hx     Social History   Tobacco Use  . Smoking status: Current Every Day Smoker    Packs/day: 2.00    Years: 45.00    Pack years: 90.00    Types: Cigarettes    Start date: 01/09/1961  . Smokeless tobacco: Never Used  . Tobacco comment: tobacco info given  Vaping Use  . Vaping Use: Never used  Substance Use Topics  . Alcohol use: No  . Drug use: No    Home Medications Prior to Admission medications   Medication Sig Start Date End Date Taking? Authorizing Provider  dicyclomine (BENTYL) 20 MG tablet Take 1 tablet (20 mg total) by mouth 2 (two) times daily. 04/05/20  Yes Marcello Fennel, PA-C  acetaminophen (TYLENOL) 325 MG tablet Take 2 tablets (650 mg total) by mouth every 4 (four) hours as needed for mild pain (or temp > 37.5 C (99.5 F)). 03/08/20   Angiulli, Lavon Paganini, PA-C  albuterol (VENTOLIN HFA) 108 (90 Base) MCG/ACT inhaler Inhale 2 puffs into the lungs every 6 (six) hours as needed. 03/08/20   Angiulli, Lavon Paganini, PA-C  aspirin EC 81 MG tablet  Take 81 mg by mouth daily.    [provider]  atorvastatin (LIPITOR) 80 MG tablet Take 1 tablet (80 mg total) by mouth daily. 03/08/20   Angiulli, Lavon Paganini, PA-C  Baclofen 5 MG TABS Take 5 mg by mouth 4 (four) times daily. 03/31/20   Lovorn, Jinny Blossom, MD  citalopram (CELEXA) 10 MG tablet Take 1 tablet (10 mg total) by mouth daily. 03/31/20   Lovorn, Jinny Blossom, MD  clopidogrel (PLAVIX) 75 MG tablet Take 1 tablet (75 mg total) by mouth daily. 03/31/20   Lovorn, Jinny Blossom, MD  diclofenac Sodium (VOLTAREN) 1 % GEL Apply 4 g topically 4 (four) times daily as needed. 03/08/20   Lovorn, Jinny Blossom, MD  magnesium oxide (MAG-OX) 400 (241.3 Mg) MG tablet Take 1 tablet (400 mg total) by mouth daily. 03/08/20   Angiulli, Lavon Paganini, PA-C  melatonin 3 MG TABS tablet Take 1 tablet (  3 mg total) by mouth at bedtime. 03/08/20   Angiulli, Lavon Paganini, PA-C  methimazole (TAPAZOLE) 5 MG tablet TAKE 1 TABLET(5 MG) BY MOUTH DAILY 03/08/20   Angiulli, Lavon Paganini, PA-C  nicotine (NICODERM CQ - DOSED IN MG/24 HOURS) 21 mg/24hr patch 21 mg patch daily x2 weeks then 14 mg patch daily x3 weeks then 7 mg patch daily x3 weeks and stop 03/08/20   Angiulli, Lavon Paganini, PA-C  pantoprazole (PROTONIX) 40 MG tablet Take 1 tablet (40 mg total) by mouth daily. 03/08/20   Angiulli, Lavon Paganini, PA-C  senna-docusate (SENOKOT-S) 8.6-50 MG tablet Take 1 tablet by mouth 2 (two) times daily. 03/08/20   Angiulli, Lavon Paganini, PA-C  tamsulosin (FLOMAX) 0.4 MG CAPS capsule Take 2 capsules (0.8 mg total) by mouth daily after supper. 03/08/20   Angiulli, Lavon Paganini, PA-C  traMADol (ULTRAM) 50 MG tablet Take 1 tablet (50 mg total) by mouth every 6 (six) hours as needed for severe pain. 03/08/20   Angiulli, Lavon Paganini, PA-C    Allergies    Patient has no known allergies.  Review of Systems   Review of Systems  Constitutional: Negative for chills and fever.  HENT: Negative for congestion.   Respiratory: Negative for shortness of breath.   Cardiovascular: Negative for chest pain.   Gastrointestinal: Positive for abdominal pain, constipation, nausea and vomiting. Negative for anal bleeding, blood in stool, diarrhea and rectal pain.  Genitourinary: Negative for enuresis and flank pain.  Musculoskeletal: Negative for back pain.  Skin: Negative for rash.  Neurological: Negative for dizziness and headaches.  Hematological: Does not bruise/bleed easily.    Physical Exam Updated Vital Signs BP 118/74 (BP Location: Right Arm)   Pulse (!) 57   Temp 97.6 F (36.4 C) (Oral)   Resp 16   Ht '6\' 1"'  (1.854 m)   Wt 94.8 kg   SpO2 94%   BMI 27.57 kg/m   Physical Exam Vitals and nursing note reviewed. Exam conducted with a chaperone present.  Constitutional:      General: He is not in acute distress.    Appearance: He is not ill-appearing.  HENT:     Head: Normocephalic and atraumatic.     Nose: No congestion.  Eyes:     Conjunctiva/sclera: Conjunctivae normal.  Cardiovascular:     Rate and Rhythm: Normal rate and regular rhythm.     Pulses: Normal pulses.     Heart sounds: No murmur heard. No friction rub. No gallop.   Pulmonary:     Effort: No respiratory distress.     Breath sounds: No wheezing, rhonchi or rales.  Abdominal:     General: There is no distension.     Palpations: Abdomen is soft.     Tenderness: There is abdominal tenderness. There is no right CVA tenderness or left CVA tenderness.     Comments: Patient's abdomen was visualized, it was nondistended, normoactive bowel sounds, dull to percussion, he had generalized abdominal pain, no rebound tenderness, negative Murphy sign McBurney point, no peritoneal sign.  Negative CVA tenderness.  Genitourinary:    Rectum: Normal.     Comments: With chaperone present, rectal exam was performed, there is no noted external hemorrhoids present, there is no noted internal hemorrhoids, no stool burden present.  No dark tarry stool or bright red blood noted on stool. Musculoskeletal:     Right lower leg: No edema.      Left lower leg: No edema.  Skin:    General: Skin is  warm and dry.  Neurological:     Mental Status: He is alert.  Psychiatric:        Mood and Affect: Mood normal.     ED Results / Procedures / Treatments   Labs (all labs ordered are listed, but only abnormal results are displayed) Labs Reviewed  CBC - Abnormal; Notable for the following components:      Result Value   RBC 5.85 (*)    All other components within normal limits  URINALYSIS, ROUTINE W REFLEX MICROSCOPIC - Abnormal; Notable for the following components:   Specific Gravity, Urine 1.033 (*)    All other components within normal limits  LIPASE, BLOOD  COMPREHENSIVE METABOLIC PANEL    EKG None  Radiology CT Abdomen Pelvis W Contrast  Result Date: 04/05/2020 CLINICAL DATA:  Abdominal distension. EXAM: CT ABDOMEN AND PELVIS WITH CONTRAST TECHNIQUE: Multidetector CT imaging of the abdomen and pelvis was performed using the standard protocol following bolus administration of intravenous contrast. CONTRAST:  154m OMNIPAQUE IOHEXOL 300 MG/ML  SOLN COMPARISON:  June 12, 2019. FINDINGS: Lower chest: No acute abnormality. Hepatobiliary: No focal liver abnormality is seen. Status post cholecystectomy. No biliary dilatation. Pancreas: Unremarkable. No pancreatic ductal dilatation or surrounding inflammatory changes. Spleen: Normal in size without focal abnormality. Adrenals/Urinary Tract: Adrenal glands appear normal. Bilateral nephrolithiasis is noted. No hydronephrosis or renal obstruction is noted. Urinary bladder is unremarkable. Stomach/Bowel: Stomach is within normal limits. Appendix appears normal. No evidence of bowel wall thickening, distention, or inflammatory changes. Vascular/Lymphatic: Aortic atherosclerosis. No enlarged abdominal or pelvic lymph nodes. Reproductive: Prostate is unremarkable. Other: No abdominal wall hernia or abnormality. No abdominopelvic ascites. Musculoskeletal: No acute or significant osseous  findings. IMPRESSION: 1. Bilateral nephrolithiasis. No hydronephrosis or renal obstruction is noted. 2. No acute abnormality seen in the abdomen or pelvis. 3. Aortic atherosclerosis. Aortic Atherosclerosis (ICD10-I70.0). Electronically Signed   By: JMarijo ConceptionM.D.   On: 04/05/2020 14:43    Procedures Procedures   Medications Ordered in ED Medications  dicyclomine (BENTYL) injection 20 mg (20 mg Intramuscular Given 04/05/20 1359)  iohexol (OMNIPAQUE) 300 MG/ML solution 100 mL (100 mLs Intravenous Contrast Given 04/05/20 1413)  mineral oil enema 1 enema (1 enema Rectal Given 04/05/20 1526)    ED Course  I have reviewed the triage vital signs and the nursing notes.  Pertinent labs & imaging results that were available during my care of the patient were reviewed by me and considered in my medical decision making (see chart for details).    MDM Rules/Calculators/A&P                         Initial impression-patient with abdominal pain and constipation x3 days.  He is alert, does not appear in acute distress, vital signs reassuring.  Will obtain basic lab work, provide patient with Bentyl, laxatives, imaging and reassess  Work-up-CBC unremarkable, CMP unremarkable, lipase 39.  UA unremarkable.  CT abdomen pelvis negative for acute findings.  Reassessment patient reassessed after enema, continues have no bowel movement, states he is in no pain this time, vital signs remained stable.  He states he would like to go home.  Updated patient on lab work imaging, patient is agreeable for discharge at this time.  Rule out-  Low suspicion for ruptured AAA as there is no bulging mass on my exam, vital signs reassuring, patient has low risk factors.  Low suspicion for bowel obstruction as abdomen is nondistended, dull to percussion,  no findings consistent with obstruction seen on CT abdomen pelvis.  low suspicion for gallbladder or liver abnormality as liver enzymes and alk phos are within normal limits,  imaging is negative for acute findings. low suspicion for UTI or pyelonephritis as patient has no CVA tenderness, denies urinary symptoms, UA negative for signs of infection.  Low suspicion for diverticulitis as exam is not consistent with etiology, no alarming findings seen on CT abdomen pelvis.  Plan-suspect patient suffering from constipation, will start patient on MiraLAX-follow-up with primary care provider in 1 week's time for reassessment.  Vital signs have remained stable, no indication for hospital admission.  Patient discussed with attending and they agreed with assessment and plan.  Patient given at home care as well strict return precautions.  Patient verbalized that they understood agreed to said plan.   Final Clinical Impression(s) / ED Diagnoses Final diagnoses:  Constipation, unspecified constipation type  Generalized abdominal pain    Rx / DC Orders ED Discharge Orders         Ordered    dicyclomine (BENTYL) 20 MG tablet  2 times daily        04/05/20 1635           Aron Baba 04/05/20 1710    Lacretia Leigh, MD 04/06/20 [phone]

## 2020-04-05 NOTE — Discharge Instructions (Addendum)
I suspect your pain is from constipation.  I would like you to start taking MiraLAX every day for the next week 1 cap full a day.  please drink plenty of fluids as this medication works as long as you stay hydrated.  Was given you a medicine called Bentyl this will help with muscle spasms please take as prescribed.  Like to follow-up with your primary care doctor in 1 week time for reevaluation.  Come back to the emergency department if you develop chest pain, shortness of breath, severe abdominal pain, uncontrolled nausea, vomiting, diarrhea.

## 2020-04-05 NOTE — ED Provider Notes (Signed)
I provided a substantive portion of the care of this patient.  I personally performed the entirety of the medical decision making for this encounter.    70 year-old male presents here with abdominal discomfort and constipation.,  CT without evidence of obstruction.  Will discharge home   Lacretia Leigh, MD 04/05/20 [phone]

## 2020-04-05 NOTE — ED Triage Notes (Signed)
Per EMS- patient has not had a normal BM x 3 days. Patient has had senna cot, laxative and a suppository. Patient states he has had liquid today, but no stool. Patient has vomited x 1 only. Abdomen is hard.

## 2020-04-05 NOTE — Telephone Encounter (Signed)
Alfonzo Beers, RN, [facility] and advised ED or Urgent care per Dr. Dagoberto Ligas

## 2020-04-05 NOTE — Telephone Encounter (Signed)
Stanton Kidney, RN, [facility] reports that patient took senna and magnesium as [rescribed over the weekend.  Nurse reports patient patient is having abdominal pain with watery stools.  She reports patient says he still feels constipated.   Nurse is concerned with impaction. She is asking for advice on what patient should do next, be seen in office or try another treatment

## 2020-04-05 NOTE — ED Notes (Signed)
Pt produced no stool post-admin of enema

## 2020-04-05 NOTE — Progress Notes (Signed)
[facility] Summary Report / Loop Recorder 

## 2020-04-06 ENCOUNTER — Telehealth: Payer: Self-pay

## 2020-04-06 DIAGNOSIS — H3530 Unspecified macular degeneration: Secondary | ICD-10-CM | POA: Diagnosis not present

## 2020-04-06 NOTE — Telephone Encounter (Signed)
[name],David Hendricks called regarding that he was at [facility] long ED yesterday and got a CT Scan and asked if we can get a copy from them. He said hes just letting us know.

## 2020-04-07 DIAGNOSIS — I69352 Hemiplegia and hemiparesis following cerebral infarction affecting left dominant side: Secondary | ICD-10-CM | POA: Diagnosis not present

## 2020-04-07 DIAGNOSIS — R1312 Dysphagia, oropharyngeal phase: Secondary | ICD-10-CM | POA: Diagnosis not present

## 2020-04-07 DIAGNOSIS — I69391 Dysphagia following cerebral infarction: Secondary | ICD-10-CM | POA: Diagnosis not present

## 2020-04-07 DIAGNOSIS — B370 Candidal stomatitis: Secondary | ICD-10-CM | POA: Diagnosis not present

## 2020-04-07 DIAGNOSIS — J449 Chronic obstructive pulmonary disease, unspecified: Secondary | ICD-10-CM | POA: Diagnosis not present

## 2020-04-07 DIAGNOSIS — I2510 Atherosclerotic heart disease of native coronary artery without angina pectoris: Secondary | ICD-10-CM | POA: Diagnosis not present

## 2020-04-08 NOTE — Telephone Encounter (Signed)
Ct looks fine- I don't know what he wants from me- It sounds like he needs to be seen by PCP and be looked over- I can help with constipation, but bowel is rarely affected in stroke patients- - I am not sure what he's asking of me at this point, as well.   ML

## 2020-04-09 ENCOUNTER — Encounter (HOSPITAL_COMMUNITY): Payer: Medicare Other

## 2020-04-09 ENCOUNTER — Ambulatory Visit (HOSPITAL_COMMUNITY): Payer: Medicare Other

## 2020-04-09 DIAGNOSIS — R1312 Dysphagia, oropharyngeal phase: Secondary | ICD-10-CM | POA: Diagnosis not present

## 2020-04-09 DIAGNOSIS — E538 Deficiency of other specified B group vitamins: Secondary | ICD-10-CM | POA: Diagnosis not present

## 2020-04-09 DIAGNOSIS — J449 Chronic obstructive pulmonary disease, unspecified: Secondary | ICD-10-CM | POA: Diagnosis not present

## 2020-04-09 DIAGNOSIS — I2510 Atherosclerotic heart disease of native coronary artery without angina pectoris: Secondary | ICD-10-CM | POA: Diagnosis not present

## 2020-04-09 DIAGNOSIS — I69352 Hemiplegia and hemiparesis following cerebral infarction affecting left dominant side: Secondary | ICD-10-CM | POA: Diagnosis not present

## 2020-04-09 DIAGNOSIS — M25519 Pain in unspecified shoulder: Secondary | ICD-10-CM | POA: Diagnosis not present

## 2020-04-09 DIAGNOSIS — I69391 Dysphagia following cerebral infarction: Secondary | ICD-10-CM | POA: Diagnosis not present

## 2020-04-09 DIAGNOSIS — B370 Candidal stomatitis: Secondary | ICD-10-CM | POA: Diagnosis not present

## 2020-04-09 DIAGNOSIS — Z8673 Personal history of transient ischemic attack (TIA), and cerebral infarction without residual deficits: Secondary | ICD-10-CM | POA: Diagnosis not present

## 2020-04-12 ENCOUNTER — Inpatient Hospital Stay: Payer: Medicare Other | Admitting: Adult Health

## 2020-04-12 DIAGNOSIS — J449 Chronic obstructive pulmonary disease, unspecified: Secondary | ICD-10-CM | POA: Diagnosis not present

## 2020-04-12 DIAGNOSIS — I69352 Hemiplegia and hemiparesis following cerebral infarction affecting left dominant side: Secondary | ICD-10-CM | POA: Diagnosis not present

## 2020-04-12 DIAGNOSIS — E785 Hyperlipidemia, unspecified: Secondary | ICD-10-CM | POA: Diagnosis not present

## 2020-04-12 DIAGNOSIS — R918 Other nonspecific abnormal finding of lung field: Secondary | ICD-10-CM | POA: Diagnosis not present

## 2020-04-12 DIAGNOSIS — E039 Hypothyroidism, unspecified: Secondary | ICD-10-CM | POA: Diagnosis not present

## 2020-04-12 DIAGNOSIS — N189 Chronic kidney disease, unspecified: Secondary | ICD-10-CM | POA: Diagnosis not present

## 2020-04-12 DIAGNOSIS — K5900 Constipation, unspecified: Secondary | ICD-10-CM | POA: Diagnosis not present

## 2020-04-12 DIAGNOSIS — F32A Depression, unspecified: Secondary | ICD-10-CM | POA: Diagnosis not present

## 2020-04-12 DIAGNOSIS — M1990 Unspecified osteoarthritis, unspecified site: Secondary | ICD-10-CM | POA: Diagnosis not present

## 2020-04-12 DIAGNOSIS — R3915 Urgency of urination: Secondary | ICD-10-CM | POA: Diagnosis not present

## 2020-04-12 DIAGNOSIS — I252 Old myocardial infarction: Secondary | ICD-10-CM | POA: Diagnosis not present

## 2020-04-12 DIAGNOSIS — E0590 Thyrotoxicosis, unspecified without thyrotoxic crisis or storm: Secondary | ICD-10-CM | POA: Diagnosis not present

## 2020-04-12 DIAGNOSIS — F17210 Nicotine dependence, cigarettes, uncomplicated: Secondary | ICD-10-CM | POA: Diagnosis not present

## 2020-04-12 DIAGNOSIS — F419 Anxiety disorder, unspecified: Secondary | ICD-10-CM | POA: Diagnosis not present

## 2020-04-12 DIAGNOSIS — R1312 Dysphagia, oropharyngeal phase: Secondary | ICD-10-CM | POA: Diagnosis not present

## 2020-04-12 DIAGNOSIS — G8929 Other chronic pain: Secondary | ICD-10-CM | POA: Diagnosis not present

## 2020-04-12 DIAGNOSIS — I69391 Dysphagia following cerebral infarction: Secondary | ICD-10-CM | POA: Diagnosis not present

## 2020-04-12 DIAGNOSIS — N401 Enlarged prostate with lower urinary tract symptoms: Secondary | ICD-10-CM | POA: Diagnosis not present

## 2020-04-12 DIAGNOSIS — M25511 Pain in right shoulder: Secondary | ICD-10-CM | POA: Diagnosis not present

## 2020-04-12 DIAGNOSIS — B370 Candidal stomatitis: Secondary | ICD-10-CM | POA: Diagnosis not present

## 2020-04-12 DIAGNOSIS — Z7902 Long term (current) use of antithrombotics/antiplatelets: Secondary | ICD-10-CM | POA: Diagnosis not present

## 2020-04-12 DIAGNOSIS — K219 Gastro-esophageal reflux disease without esophagitis: Secondary | ICD-10-CM | POA: Diagnosis not present

## 2020-04-12 DIAGNOSIS — Z79891 Long term (current) use of opiate analgesic: Secondary | ICD-10-CM | POA: Diagnosis not present

## 2020-04-12 DIAGNOSIS — I2510 Atherosclerotic heart disease of native coronary artery without angina pectoris: Secondary | ICD-10-CM | POA: Diagnosis not present

## 2020-04-12 DIAGNOSIS — I700 Atherosclerosis of aorta: Secondary | ICD-10-CM | POA: Diagnosis not present

## 2020-04-13 DIAGNOSIS — I2510 Atherosclerotic heart disease of native coronary artery without angina pectoris: Secondary | ICD-10-CM | POA: Diagnosis not present

## 2020-04-13 DIAGNOSIS — B370 Candidal stomatitis: Secondary | ICD-10-CM | POA: Diagnosis not present

## 2020-04-13 DIAGNOSIS — I69391 Dysphagia following cerebral infarction: Secondary | ICD-10-CM | POA: Diagnosis not present

## 2020-04-13 DIAGNOSIS — I69352 Hemiplegia and hemiparesis following cerebral infarction affecting left dominant side: Secondary | ICD-10-CM | POA: Diagnosis not present

## 2020-04-13 DIAGNOSIS — J449 Chronic obstructive pulmonary disease, unspecified: Secondary | ICD-10-CM | POA: Diagnosis not present

## 2020-04-13 DIAGNOSIS — R1312 Dysphagia, oropharyngeal phase: Secondary | ICD-10-CM | POA: Diagnosis not present

## 2020-04-14 DIAGNOSIS — I69352 Hemiplegia and hemiparesis following cerebral infarction affecting left dominant side: Secondary | ICD-10-CM | POA: Diagnosis not present

## 2020-04-14 DIAGNOSIS — I69391 Dysphagia following cerebral infarction: Secondary | ICD-10-CM | POA: Diagnosis not present

## 2020-04-14 DIAGNOSIS — R1312 Dysphagia, oropharyngeal phase: Secondary | ICD-10-CM | POA: Diagnosis not present

## 2020-04-14 DIAGNOSIS — I2510 Atherosclerotic heart disease of native coronary artery without angina pectoris: Secondary | ICD-10-CM | POA: Diagnosis not present

## 2020-04-14 DIAGNOSIS — B370 Candidal stomatitis: Secondary | ICD-10-CM | POA: Diagnosis not present

## 2020-04-14 DIAGNOSIS — J449 Chronic obstructive pulmonary disease, unspecified: Secondary | ICD-10-CM | POA: Diagnosis not present

## 2020-04-15 DIAGNOSIS — I69352 Hemiplegia and hemiparesis following cerebral infarction affecting left dominant side: Secondary | ICD-10-CM | POA: Diagnosis not present

## 2020-04-15 DIAGNOSIS — R1312 Dysphagia, oropharyngeal phase: Secondary | ICD-10-CM | POA: Diagnosis not present

## 2020-04-15 DIAGNOSIS — J449 Chronic obstructive pulmonary disease, unspecified: Secondary | ICD-10-CM | POA: Diagnosis not present

## 2020-04-15 DIAGNOSIS — I2510 Atherosclerotic heart disease of native coronary artery without angina pectoris: Secondary | ICD-10-CM | POA: Diagnosis not present

## 2020-04-15 DIAGNOSIS — I69391 Dysphagia following cerebral infarction: Secondary | ICD-10-CM | POA: Diagnosis not present

## 2020-04-15 DIAGNOSIS — B370 Candidal stomatitis: Secondary | ICD-10-CM | POA: Diagnosis not present

## 2020-04-16 DIAGNOSIS — M25519 Pain in unspecified shoulder: Secondary | ICD-10-CM | POA: Diagnosis not present

## 2020-04-19 ENCOUNTER — Other Ambulatory Visit: Payer: Self-pay

## 2020-04-19 ENCOUNTER — Ambulatory Visit (HOSPITAL_COMMUNITY)
Admission: RE | Admit: 2020-04-19 | Discharge: 2020-04-19 | Disposition: A | Discharge status: Home / Self Care | Payer: Medicare Other | Source: Ambulatory Visit | Attending: Physical Medicine and Rehabilitation | Admitting: Physical Medicine and Rehabilitation

## 2020-04-19 ENCOUNTER — Ambulatory Visit: Payer: Medicare Other | Admitting: Physical Medicine and Rehabilitation

## 2020-04-19 DIAGNOSIS — R1310 Dysphagia, unspecified: Secondary | ICD-10-CM | POA: Diagnosis not present

## 2020-04-19 DIAGNOSIS — R1312 Dysphagia, oropharyngeal phase: Secondary | ICD-10-CM | POA: Insufficient documentation

## 2020-04-19 DIAGNOSIS — I2510 Atherosclerotic heart disease of native coronary artery without angina pectoris: Secondary | ICD-10-CM | POA: Diagnosis not present

## 2020-04-19 DIAGNOSIS — I69391 Dysphagia following cerebral infarction: Secondary | ICD-10-CM | POA: Diagnosis not present

## 2020-04-19 DIAGNOSIS — T17320A Food in larynx causing asphyxiation, initial encounter: Secondary | ICD-10-CM | POA: Diagnosis not present

## 2020-04-19 DIAGNOSIS — I69352 Hemiplegia and hemiparesis following cerebral infarction affecting left dominant side: Secondary | ICD-10-CM | POA: Diagnosis not present

## 2020-04-19 DIAGNOSIS — B370 Candidal stomatitis: Secondary | ICD-10-CM | POA: Diagnosis not present

## 2020-04-19 DIAGNOSIS — J449 Chronic obstructive pulmonary disease, unspecified: Secondary | ICD-10-CM | POA: Diagnosis not present

## 2020-04-19 NOTE — Progress Notes (Signed)
Modified Barium Swallow Progress Note  Patient Details  Name: David Hendricks MRN: [id number] Date of Birth: 1950-02-09  Today's Date: 04/19/2020  Modified Barium Swallow completed.  Full report located under Chart Review in the Imaging Section.  Brief recommendations include the following:  Clinical Impression  Pt's swallow function is relatively consistent with results from last MBS (2/24).  He continues to present with prolonged but functional oral preparation of solids.  There was occasional penetration of thin liquids (PAS 2), considered WFL.  No aspiration noted.  There is ongoing residual of solids in the pyriforms and valleculae - this appears to be due to decreased base-of-tongue pressure against the pharyngeal wall as well as reduced pharyngeal stripping in general.  Pt completes a second sub-swallow to clear residue - this helps reduce but does not eliminate residuals.  Recommend continuing regular solids per pt's preferences; thin liquids; pills whole in liquid. Pt may benefit from ongoing work to strengthen base of tongue/pharyngeal squeeze (can try Masako manuever and Shaker exercise - reviewed with pt) - will defer exercise plan to [facility] SLP.   Swallow Evaluation Recommendations       SLP Diet Recommendations: Regular solids;Thin liquid   Liquid Administration via: Cup   Medication Administration: Whole meds with liquid   Supervision: Patient able to self feed           Oral Care Recommendations: Oral care BID       Marquite Attwood L. Tivis Ringer, [place] Office number [phone] Pager [phone]  Juan Quam Laurice 04/19/2020,1:10 PM

## 2020-04-20 ENCOUNTER — Other Ambulatory Visit: Payer: Self-pay | Admitting: Internal Medicine

## 2020-04-20 DIAGNOSIS — I69352 Hemiplegia and hemiparesis following cerebral infarction affecting left dominant side: Secondary | ICD-10-CM | POA: Diagnosis not present

## 2020-04-20 DIAGNOSIS — I2510 Atherosclerotic heart disease of native coronary artery without angina pectoris: Secondary | ICD-10-CM | POA: Diagnosis not present

## 2020-04-20 DIAGNOSIS — J449 Chronic obstructive pulmonary disease, unspecified: Secondary | ICD-10-CM | POA: Diagnosis not present

## 2020-04-20 DIAGNOSIS — R1312 Dysphagia, oropharyngeal phase: Secondary | ICD-10-CM | POA: Diagnosis not present

## 2020-04-20 DIAGNOSIS — I69391 Dysphagia following cerebral infarction: Secondary | ICD-10-CM | POA: Diagnosis not present

## 2020-04-20 DIAGNOSIS — B370 Candidal stomatitis: Secondary | ICD-10-CM | POA: Diagnosis not present

## 2020-04-21 NOTE — Progress Notes (Signed)
Cardiology Office Note   Date:  04/22/2020   ID:  David Hendricks, DOB July 09, 1950, MRN [id number]  PCP:  Horald Pollen, MD    No chief complaint on file.  CAD  Wt Readings from Last 3 Encounters:  04/22/20 221 lb (100.2 kg)  04/05/20 209 lb (94.8 kg)  03/23/20 215 lb (97.5 kg)       History of Present Illness: David Hendricks is a 70 y.o. male   with a history of tobacco abuse.In 10/18,He had intermittent chest discomfortwhichhe described as a pressure, that lasted anywhere from 30-45 minutes. He thought it was gas. He had more discomfort a few hours later. It moved to his left arm and left side of his face. It persisted even while he was at rest. He came to the ER via EMS.  InitialPOCtroponin was positive. A lab troponin was also positive. He had some relief of his chest discomfort with nitroglycerin.  Cath showed: 1. Severe single-vessel coronary artery disease with serial 50% ostial and 90% proximal/mid LAD stenoses. 2. Mild, nonobstructive CAD involving the LCx and RCA. 3. Subtle mid and apical anterior hypokinesis with otherwise preserved left ventricular contraction; LVEF low normal at 50-55%. 4. Upper normal left ventricular filling pressure. Successful IVUS guided PCI to the proximal/mid LAD with placement of a Resolute Onyx 3.5 x 22 mm drug-eluting stent, post dilated proximally with a 4.0 mm Hawkeye balloon. The first diagonal branch was jailed by the stent but demonstrates only mild ostial stenosis post PCI with TIMI-3 flow."  "He was admitted with chest pain in Jan 2019. Echo showed :  Left ventricle: The cavity size was normal. Systolic function was normal. The estimated ejection fraction was in the range of 55% to 60%. Wall motion was normal; there were no regional wall motion abnormalities. Doppler parameters are consistent with abnormal left ventricular relaxation (grade 1 diastolic dysfunction). There was no evidence of elevated  ventricular filling pressure by Doppler parameters. - Aortic valve: There was mild regurgitation. - Mitral valve: There was no regurgitation. - Right ventricle: The cavity size was moderately dilated. Wall thickness was normal. Systolic function was normal. - Tricuspid valve: There was moderate regurgitation. - Pulmonary arteries: Systolic pressure was mildly increased. PA peak pressure: 32 mm Hg (S). - Inferior vena cava: The vessel was normal in size. - Pericardium, extracardiac: There was no pericardial effusion.   He was admitted in Jan 2019 with chest pain after coughing. He ruled out for MI. There was some question of compliance with medication. "  Difficulty stopping smoking. Tried patches without success.  Post PCI, he had persistent SHOB and leg cramps.    He had a kidney stone on the left in 2021.    Hospitalized for right middle cerebral infarct in 2022.  Following with neuro and PT.   Feels he is getting stronger.  Has not smoked for 2 months.    Loop recorder was placed.   Past Medical History:  Diagnosis Date  . Allergy   . Anxiety   . Aortic atherosclerosis ([place])   . Arthritis   . Benign prostatic hyperplasia (BPH) with urinary urgency   . Chronic kidney disease    KIDNEY STONES  . COPD (chronic obstructive pulmonary disease) ([place])    possible   . Coronary artery disease    10/18 PCI/DES to pLAD, mild nonobstructive disease in the Lcx/RCA. Normal EF.   Marland Kitchen Depression   . Dyslipidemia   . Dysrhythmia 2016   irregular heartbeat  .  Gallstones   . GERD (gastroesophageal reflux disease)   . History of kidney stones   . Hyperlipidemia   . Hyperthyroidism   . Leg cramps   . Multiple pulmonary nodules   . Myocardial infarction ([place])    2018  . Pneumonia   . Seasonal allergies   . Stroke ([place])   . Thyroid nodule   . Vertigo   . Vitamin B 12 deficiency     Past Surgical History:  Procedure Laterality Date  . COLONOSCOPY    . CORONARY  ANGIOPLASTY  10/09/2016  . CORONARY STENT INTERVENTION N/A 10/09/2016   Procedure: CORONARY STENT INTERVENTION;  Surgeon: Nelva Bush, MD;  Location: [facility];  Service: Cardiovascular;  Laterality: N/A;  . CYSTOSCOPY WITH RETROGRADE PYELOGRAM, URETEROSCOPY AND STENT PLACEMENT Left 06/17/2019   Procedure: CYSTOSCOPY WITH RETROGRADE PYELOGRAM, URETEROSCOPY AND STENT PLACEMENT;  Surgeon: Robley Fries, MD;  Location: [facility];  Service: Urology;  Laterality: Left;  1 HR  . HOLMIUM LASER APPLICATION Left 02/18/9240   Procedure: HOLMIUM LASER APPLICATION;  Surgeon: Robley Fries, MD;  Location: [facility];  Service: Urology;  Laterality: Left;  . INTRAVASCULAR ULTRASOUND/IVUS N/A 10/09/2016   Procedure: Intravascular Ultrasound/IVUS;  Surgeon: Nelva Bush, MD;  Location: [facility];  Service: Cardiovascular;  Laterality: N/A;  . IR CHOLANGIOGRAM EXISTING TUBE  11/18/2018  . IR PERC CHOLECYSTOSTOMY  11/16/2018  . LEFT HEART CATH AND CORONARY ANGIOGRAPHY N/A 10/09/2016   Procedure: LEFT HEART CATH AND CORONARY ANGIOGRAPHY;  Surgeon: Nelva Bush, MD;  Location: [facility];  Service: Cardiovascular;  Laterality: N/A;  . LOOP RECORDER INSERTION N/A 02/20/2020   Procedure: LOOP RECORDER INSERTION;  Surgeon: Vickie Epley, MD;  Location: [facility];  Service: Cardiovascular;  Laterality: N/A;     Current Outpatient Medications  Medication Sig Dispense Refill  . acetaminophen (TYLENOL) 325 MG tablet Take 2 tablets (650 mg total) by mouth every 4 (four) hours as needed for mild pain (or temp > 37.5 C (99.5 F)).    Marland Kitchen albuterol (VENTOLIN HFA) 108 (90 Base) MCG/ACT inhaler Inhale 2 puffs into the lungs every 6 (six) hours as needed. 18 g 0  . albuterol (VENTOLIN HFA) 108 (90 Base) MCG/ACT inhaler INHALE 2 PUFFS INTO THE LUNGS EVERY SIX HOURS AS NEEDED. 8.5 g 0  . aspirin EC 81 MG tablet Take 81 mg by mouth daily.    Marland Kitchen atorvastatin (LIPITOR) 80 MG tablet Take 1  tablet (80 mg total) by mouth daily. 30 tablet 3  . atorvastatin (LIPITOR) 80 MG tablet TAKE 1 TABLET (80 MG TOTAL) BY MOUTH DAILY. 30 tablet 3  . Baclofen 5 MG TABS Take 5 mg by mouth 4 (four) times daily. 120 tablet 5  . Baclofen 5 MG TABS TAKE 1 TABLET BY MOUTH FOUR TIMES DAILY. 100 tablet 0  . citalopram (CELEXA) 10 MG tablet Take 1 tablet (10 mg total) by mouth daily. 30 tablet 5  . citalopram (CELEXA) 10 MG tablet TAKE 1 TABLET (10 MG TOTAL) BY MOUTH DAILY. 30 tablet 0  . clopidogrel (PLAVIX) 75 MG tablet Take 1 tablet (75 mg total) by mouth daily. 30 tablet 1  . clopidogrel (PLAVIX) 75 MG tablet TAKE 1 TABLET (75 MG TOTAL) BY MOUTH DAILY. 30 tablet 1  . diclofenac Sodium (VOLTAREN) 1 % GEL Apply 4 g topically 4 (four) times daily as needed. 200 g 0  . diclofenac Sodium (VOLTAREN) 1 % GEL APPLY 4 G TOPICALLY FOUR  TIMES DAILY AS NEEDED. 200 g 0  . dicyclomine (BENTYL) 20 MG tablet Take 1 tablet (20 mg total) by mouth 2 (two) times daily. 20 tablet 0  . lidocaine (LIDODERM) 5 % PLACE 1 PATCH ONTO THE SKIN DAILY. REMOVE & DISCARD PATCH WITHIN 12 HOURS OR AS DIRECTED BY MD (Patient taking differently: 1 patch daily. Remove & discard patch within 12 hours or as directed by MD) 30 patch 0  . magnesium oxide (MAG-OX) 400 (241.3 Mg) MG tablet Take 1 tablet (400 mg total) by mouth daily. 30 tablet 0  . magnesium oxide (MAG-OX) 400 MG tablet TAKE 1 TABLET (400 MG TOTAL) BY MOUTH DAILY. 30 tablet 0  . melatonin 3 MG TABS tablet Take 1 tablet (3 mg total) by mouth at bedtime. 30 tablet 0  . melatonin 3 MG TABS tablet TAKE 1 TABLET (3 MG TOTAL) BY MOUTH AT BEDTIME. 30 tablet 0  . methimazole (TAPAZOLE) 5 MG tablet TAKE 1 TABLET(5 MG) BY MOUTH DAILY 30 tablet 1  . methimazole (TAPAZOLE) 5 MG tablet TAKE 1 TABLET(5 MG) BY MOUTH DAILY 30 tablet 1  . nicotine (NICODERM CQ - DOSED IN MG/24 HOURS) 14 mg/24hr patch APPLY 1 PATCH (14 MG) DAILY FOR THREE WEEKS 21 patch 0  . nicotine (NICODERM CQ - DOSED IN MG/24  HOURS) 21 mg/24hr patch 21 mg patch daily x2 weeks then 14 mg patch daily x3 weeks then 7 mg patch daily x3 weeks and stop 28 patch 0  . nicotine (NICODERM CQ - DOSED IN MG/24 HOURS) 21 mg/24hr patch APPLY 1 PATCH (21 MG) DAILY FOR 2 WEEKS 28 patch 0  . pantoprazole (PROTONIX) 40 MG tablet Take 1 tablet (40 mg total) by mouth daily. 30 tablet 0  . pantoprazole (PROTONIX) 40 MG tablet TAKE 1 TABLET (40 MG TOTAL) BY MOUTH DAILY. 30 tablet 0  . senna-docusate (SENOKOT-S) 8.6-50 MG tablet Take 1 tablet by mouth 2 (two) times daily.    . tamsulosin (FLOMAX) 0.4 MG CAPS capsule Take 2 capsules (0.8 mg total) by mouth daily after supper. 30 capsule 0  . tamsulosin (FLOMAX) 0.4 MG CAPS capsule TAKE 2 CAPSULES (0.8 MG TOTAL) BY MOUTH DAILY AFTER SUPPER. 60 capsule 0  . traMADol (ULTRAM) 50 MG tablet Take 1 tablet (50 mg total) by mouth every 6 (six) hours as needed for severe pain. 30 tablet 0  . traMADol (ULTRAM) 50 MG tablet TAKE 1 TABLET (50 MG TOTAL) BY MOUTH EVERY SIX HOURS AS NEEDED FOR SEVERE PAIN. 30 tablet 0   Current Facility-Administered Medications  Medication Dose Route Frequency Provider Last Rate Last Admin  . cyanocobalamin ((VITAMIN B-12)) injection 1,000 mcg  1,000 mcg Intramuscular Q30 days Horald Pollen, MD   1,000 mcg at 08/29/19 1525    Allergies:   Patient has no known allergies.    Social History:  The patient  reports that he has been smoking cigarettes. He started smoking about 59 years ago. He has a 90.00 pack-year smoking history. He has never used smokeless tobacco. He reports that he does not drink alcohol and does not use drugs.   Family History:  The patient's family history includes Alcohol abuse in his father; Cancer in his father.    ROS:  Please see the history of present illness.   Otherwise, review of systems are positive for recent stroke.   All other systems are reviewed and negative.    PHYSICAL EXAM: VS:  Ht 6\' 1"  (1.854 m)   Wt 221  lb (100.2 kg)    BMI 29.16 kg/m  , BMI Body mass index is 29.16 kg/m. GEN: Well nourished, well developed, in no acute distress  HEENT: normal  Neck: no JVD, carotid bruits, or masses Cardiac: RRR; no murmurs, rubs, or gallops,no edema  Respiratory:  clear to auscultation bilaterally, normal work of breathing GI: soft, nontender, nondistended, + BS MS: no deformity or atrophy  Skin: warm and dry, no rash Neuro:  Walking with a cane Psych: euthymic mood, full affect   EKG:   The ekg ordered today demonstrates NSR, RBBB   Recent Labs: 02/15/2020: TSH 0.919 04/05/2020: ALT 22; BUN 13; Creatinine, Ser 1.05; Hemoglobin 17.0; Platelets 162; Potassium 4.4; Sodium 138   Lipid Panel    Component Value Date/Time   CHOL 168 02/16/2020 0500   CHOL 106 12/04/2016 1029   TRIG 80 02/16/2020 0500   HDL 41 02/16/2020 0500   HDL 45 12/04/2016 1029   CHOLHDL 4.1 02/16/2020 0500   VLDL 16 02/16/2020 0500   LDLCALC 111 (H) 02/16/2020 0500   LDLCALC 38 12/04/2016 1029     Other studies Reviewed: Additional studies/ records that were reviewed today with results demonstrating: Hospital records reviewed.   ASSESSMENT AND PLAN:  1.   CAD/Old MI: Continue aggressive secondary prevention.  Aspirin and Plavix for CVA for a short time.  Now just on Plavix. No bleeding issues 2.   Tobacco abuse: Tried nicoderm patch. He has not smoked since 02/15/20. 3.   Hyperlipidemia: Continue high dose atorvastatin.  Continue whole food, plant-based diet.  Avoid processed foods.  LDL 111 in 2/22.  Eating healthier since the CVA.  Needs to decrease TV dinner intake.  May need to consider Zetia if his LDL does not improve on recheck.  WIll recheck in June.   4.   CVA: Finished PT.  HE feels that his strength is coming back slowly.  He is doing speech therapy for swallowing. Diet has been adjusted.  Loop recorder in place.  Will check that EP is following.      Current medicines are reviewed at length with the patient today.  The  patient concerns regarding his medicines were addressed.  The following changes have been made:  No change  Labs/ tests ordered today include:  No orders of the defined types were placed in this encounter.   Recommend 150 minutes/week of aerobic exercise Low fat, low carb, high fiber diet recommended  Disposition:   FU in 1 year   Signed, Larae Grooms, MD  04/22/2020 1:23 PM    [facility] [address], [place], [place]  [address] Phone: [phone]; Fax: [phone]

## 2020-04-22 ENCOUNTER — Other Ambulatory Visit: Payer: Self-pay

## 2020-04-22 ENCOUNTER — Ambulatory Visit (INDEPENDENT_AMBULATORY_CARE_PROVIDER_SITE_OTHER): Payer: Medicare Other | Admitting: Interventional Cardiology

## 2020-04-22 ENCOUNTER — Encounter: Payer: Self-pay | Admitting: Interventional Cardiology

## 2020-04-22 VITALS — BP 134/68 | HR 94 | Ht 73.0 in | Wt 221.0 lb

## 2020-04-22 DIAGNOSIS — Z720 Tobacco use: Secondary | ICD-10-CM

## 2020-04-22 DIAGNOSIS — I639 Cerebral infarction, unspecified: Secondary | ICD-10-CM

## 2020-04-22 DIAGNOSIS — I252 Old myocardial infarction: Secondary | ICD-10-CM | POA: Diagnosis not present

## 2020-04-22 DIAGNOSIS — E7800 Pure hypercholesterolemia, unspecified: Secondary | ICD-10-CM | POA: Diagnosis not present

## 2020-04-22 DIAGNOSIS — I63511 Cerebral infarction due to unspecified occlusion or stenosis of right middle cerebral artery: Secondary | ICD-10-CM | POA: Diagnosis not present

## 2020-04-22 DIAGNOSIS — R0600 Dyspnea, unspecified: Secondary | ICD-10-CM

## 2020-04-22 DIAGNOSIS — I25118 Atherosclerotic heart disease of native coronary artery with other forms of angina pectoris: Secondary | ICD-10-CM | POA: Diagnosis not present

## 2020-04-22 NOTE — Patient Instructions (Signed)
Medication Instructions:  Your physician recommends that you continue on your current medications as directed. Please refer to the Current Medication list given to you today.  *If you need a refill on your cardiac medications before your next appointment, please call your pharmacy*   Lab Work: Your physician recommends that you return for lab work on June 7,2022.  Lipid and Liver profiles.  This will be fasting.  The lab opens at 7:30 AM  If you have labs (blood work) drawn today and your tests are completely normal, you will receive your results only by: Marland Kitchen MyChart Message (if you have MyChart) OR . A paper copy in the mail If you have any lab test that is abnormal or we need to change your treatment, we will call you to review the results.   Testing/Procedures: none   Follow-Up: At [facility], you and your health needs are our priority.  As part of our continuing mission to provide you with exceptional heart care, we have created designated Provider Care Teams.  These Care Teams include your primary Cardiologist (physician) and Advanced Practice Providers (APPs -  Physician Assistants and Nurse Practitioners) who all work together to provide you with the care you need, when you need it.  We recommend signing up for the patient portal called "MyChart".  Sign up information is provided on this After Visit Summary.  MyChart is used to connect with patients for Virtual Visits (Telemedicine).  Patients are able to view lab/test results, encounter notes, upcoming appointments, etc.  Non-urgent messages can be sent to your provider as well.   To learn more about what you can do with MyChart, go to [email or web address].    Your next appointment:   12 month(s)  The format for your next appointment:   In Person  Provider:   You may see Dr Irish Lack or one of the following Advanced Practice Providers on your designated Care Team:    Melina Copa, PA-C  Ermalinda Barrios, PA-C    Other  Instructions  High-Fiber Eating Plan Fiber, also called dietary fiber, is a type of carbohydrate. It is found foods such as fruits, vegetables, whole grains, and beans. A high-fiber diet can have many health benefits. Your health care provider may recommend a high-fiber diet to help:  Prevent constipation. Fiber can make your bowel movements more regular.  Lower your cholesterol.  Relieve the following conditions: ? Inflammation of veins in the anus (hemorrhoids). ? Inflammation of specific areas of the digestive tract (uncomplicated diverticulosis). ? A problem of the large intestine, also called the colon, that sometimes causes pain and diarrhea (irritable bowel syndrome, or IBS).  Prevent overeating as part of a weight-loss plan.  Prevent heart disease, type 2 diabetes, and certain cancers. What are tips for following this plan? Reading food labels  Check the nutrition facts label on food products for the amount of dietary fiber. Choose foods that have 5 grams of fiber or more per serving.  The goals for recommended daily fiber intake include: ? Men (age 37 or younger): 34-38 g. ? Men (over age 66): 28-34 g. ? Women (age 6 or younger): 25-28 g. ? Women (over age 32): 22-25 g. Your daily fiber goal is _____________ g.   Shopping  Choose whole fruits and vegetables instead of processed forms, such as apple juice or applesauce.  Choose a wide variety of high-fiber foods such as avocados, lentils, oats, and kidney beans.  Read the nutrition facts label of the foods you choose.  Be aware of foods with added fiber. These foods often have high sugar and sodium amounts per serving. Cooking  Use whole-grain flour for baking and cooking.  Cook with brown rice instead of white rice. Meal planning  Start the day with a breakfast that is high in fiber, such as a cereal that contains 5 g of fiber or more per serving.  Eat breads and cereals that are made with whole-grain flour  instead of refined flour or white flour.  Eat brown rice, bulgur wheat, or millet instead of white rice.  Use beans in place of meat in soups, salads, and pasta dishes.  Be sure that half of the grains you eat each day are whole grains. General information  You can get the recommended daily intake of dietary fiber by: ? Eating a variety of fruits, vegetables, grains, nuts, and beans. ? Taking a fiber supplement if you are not able to take in enough fiber in your diet. It is better to get fiber through food than from a supplement.  Gradually increase how much fiber you consume. If you increase your intake of dietary fiber too quickly, you may have bloating, cramping, or gas.  Drink plenty of water to help you digest fiber.  Choose high-fiber snacks, such as berries, raw vegetables, nuts, and popcorn. What foods should I eat? Fruits Berries. Pears. Apples. Oranges. Avocado. Prunes and raisins. Dried figs. Vegetables Sweet potatoes. Spinach. Kale. Artichokes. Cabbage. Broccoli. Cauliflower. Green peas. Carrots. Squash. Grains Whole-grain breads. Multigrain cereal. Oats and oatmeal. Brown rice. Barley. Bulgur wheat. [place]. Quinoa. Bran muffins. Popcorn. Rye wafer crackers. Meats and other proteins Navy beans, kidney beans, and pinto beans. Soybeans. Split peas. Lentils. Nuts and seeds. Dairy Fiber-fortified yogurt. Beverages Fiber-fortified soy milk. Fiber-fortified orange juice. Other foods Fiber bars. The items listed above may not be a complete list of recommended foods and beverages. Contact a dietitian for more information. What foods should I avoid? Fruits Fruit juice. Cooked, strained fruit. Vegetables Fried potatoes. Canned vegetables. Well-cooked vegetables. Grains White bread. Pasta made with refined flour. White rice. Meats and other proteins Fatty cuts of meat. Fried chicken or fried fish. Dairy Milk. Yogurt. Cream cheese. Sour cream. Fats and  oils Butters. Beverages Soft drinks. Other foods Cakes and pastries. The items listed above may not be a complete list of foods and beverages to avoid. Talk with your dietitian about what choices are best for you. Summary  Fiber is a type of carbohydrate. It is found in foods such as fruits, vegetables, whole grains, and beans.  A high-fiber diet has many benefits. It can help to prevent constipation, lower blood cholesterol, aid weight loss, and reduce your risk of heart disease, diabetes, and certain cancers.  Increase your intake of fiber gradually. Increasing fiber too quickly may cause cramping, bloating, and gas. Drink plenty of water while you increase the amount of fiber you consume.  The best sources of fiber include whole fruits and vegetables, whole grains, nuts, seeds, and beans. This information is not intended to replace advice given to you by your health care provider. Make sure you discuss any questions you have with your health care provider. Document Revised: 05/01/2019 Document Reviewed: 05/01/2019 Elsevier Patient Education  2021 [employer].

## 2020-04-23 ENCOUNTER — Telehealth: Payer: Self-pay

## 2020-04-23 ENCOUNTER — Telehealth: Payer: Self-pay | Admitting: Interventional Cardiology

## 2020-04-23 DIAGNOSIS — I69391 Dysphagia following cerebral infarction: Secondary | ICD-10-CM | POA: Diagnosis not present

## 2020-04-23 DIAGNOSIS — B370 Candidal stomatitis: Secondary | ICD-10-CM | POA: Diagnosis not present

## 2020-04-23 DIAGNOSIS — J449 Chronic obstructive pulmonary disease, unspecified: Secondary | ICD-10-CM | POA: Diagnosis not present

## 2020-04-23 DIAGNOSIS — R1312 Dysphagia, oropharyngeal phase: Secondary | ICD-10-CM | POA: Diagnosis not present

## 2020-04-23 DIAGNOSIS — I2510 Atherosclerotic heart disease of native coronary artery without angina pectoris: Secondary | ICD-10-CM | POA: Diagnosis not present

## 2020-04-23 DIAGNOSIS — I69352 Hemiplegia and hemiparesis following cerebral infarction affecting left dominant side: Secondary | ICD-10-CM | POA: Diagnosis not present

## 2020-04-23 MED ORDER — METOPROLOL SUCCINATE ER 25 MG PO TB24: 25.0000 mg | ORAL_TABLET | Freq: Every day | ORAL | 3 refills | Status: DC

## 2020-04-23 MED ORDER — APIXABAN 5 MG PO TABS: 5.0000 mg | ORAL_TABLET | Freq: Two times a day (BID) | ORAL | 3 refills | Status: DC

## 2020-04-23 NOTE — Telephone Encounter (Signed)
David Hendricks called to say that the patient has had irregular heart rate and has been lightheaded but the bp is normal. Please advise

## 2020-04-23 NOTE — Telephone Encounter (Signed)
Nurse from [facility] called in stating patient is dizzy and is concerned about patients heart rate and sent a manual transmission and wants someone can go over it with her

## 2020-04-23 NOTE — Telephone Encounter (Signed)
Transmission received.  Reviewed with Dr. Lovena Le in office.  Pt in AF with RVR.    Spoke with pt and Risk manager, David Hendricks.  Explained AF and concerns for stroke.  Pt to start Eliquis 5 mg BID and Metoprolol Succinate 25mg  daily, preferably start today.   Pt indicated if meds sent to [facility] he can have someone pick up today.

## 2020-04-23 NOTE — Telephone Encounter (Signed)
Call taken by Amy, RN in device clinic.  No further care needed by triage at this time.

## 2020-04-26 ENCOUNTER — Ambulatory Visit (INDEPENDENT_AMBULATORY_CARE_PROVIDER_SITE_OTHER): Payer: Medicare Other | Admitting: Adult Health

## 2020-04-26 ENCOUNTER — Encounter: Payer: Self-pay | Admitting: Adult Health

## 2020-04-26 VITALS — BP 124/62 | HR 84 | Ht 72.0 in | Wt 225.0 lb

## 2020-04-26 DIAGNOSIS — I63511 Cerebral infarction due to unspecified occlusion or stenosis of right middle cerebral artery: Secondary | ICD-10-CM

## 2020-04-26 DIAGNOSIS — B370 Candidal stomatitis: Secondary | ICD-10-CM | POA: Diagnosis not present

## 2020-04-26 DIAGNOSIS — G894 Chronic pain syndrome: Secondary | ICD-10-CM | POA: Diagnosis not present

## 2020-04-26 DIAGNOSIS — I69391 Dysphagia following cerebral infarction: Secondary | ICD-10-CM | POA: Diagnosis not present

## 2020-04-26 DIAGNOSIS — I480 Paroxysmal atrial fibrillation: Secondary | ICD-10-CM | POA: Diagnosis not present

## 2020-04-26 DIAGNOSIS — I2510 Atherosclerotic heart disease of native coronary artery without angina pectoris: Secondary | ICD-10-CM | POA: Diagnosis not present

## 2020-04-26 DIAGNOSIS — R1312 Dysphagia, oropharyngeal phase: Secondary | ICD-10-CM | POA: Diagnosis not present

## 2020-04-26 DIAGNOSIS — J449 Chronic obstructive pulmonary disease, unspecified: Secondary | ICD-10-CM | POA: Diagnosis not present

## 2020-04-26 DIAGNOSIS — I69352 Hemiplegia and hemiparesis following cerebral infarction affecting left dominant side: Secondary | ICD-10-CM | POA: Diagnosis not present

## 2020-04-26 MED ORDER — TRAMADOL HCL 50 MG PO TABS: 50.0000 mg | ORAL_TABLET | Freq: Two times a day (BID) | ORAL | 0 refills | Status: DC | PRN

## 2020-04-26 NOTE — Progress Notes (Signed)
[facility] [address] [place]. [place] [address] [phone]       HOSPITAL FOLLOW UP NOTE  [name] Date of Birth:  [birth year] Medical Record Number:  [id number]   Reason for Referral:  hospital stroke follow up    SUBJECTIVE:   CHIEF COMPLAINT:  Chief Complaint  Patient presents with  . Follow-up    RM 14 alone Pt is well, no complaints. Getting better everyday.     HPI:   [name] is a 70 y.o. male with history of HLD, CAD with hx MI, COPD, depression, tobacco abuse, GERD, nephrolithiasis, hyperthyroidism, and CKD who presented on 02/15/2020 with slurred speech, Rt facial droop and Left arm weakness.  Personally reviewed hospitalization pertinent progress notes, lab work and imaging with summary provided. Stroke work up revealed acute right MCA cortical and subcortical infarcts, concerning for embolic pattern secondary to unclear source. Loop recorder placed to further eval for atrial fibrillation. Recommended DAPT for 3 weeks then plavix alone as on aspirin PTA. LDL 118 on atorvastatin 80 mg daily. Current tobacco use with cessation counseling provided. Other stroke risk factors include CAD w/ history of MI, advanced age and irregular rhythm noted in prior diagnosis. Residual deficit of left arm weakness with dexterity, left facial weakness, dysarthria and dysphagia.  Evaluated by therapies and recommended discharge to CIR for ongoing therapy needs.  Stroke: Acute right MCA cortical and subcortical infarcts, concerning for embolic pattern, source unclear  CT Code Stroke- No evidence of acute large vascular territory infarct.   CTA head & neck- The nondominant right vertebral artery is diminutive throughout its course and likely occluded intradurally. Diminutive vertebrobasilar system intracranially with bilateral fetal type PCAs, anatomic variant.   MRI -with 6 today is am not sure dilatation to the same thing cortical and subcortical infarct  in the high right posterior frontal lobe. Small focus of susceptibility artifact in this region may represent a small thrombosed distal cortical MCA Branch. Possible additional punctate infarct in the right parietal cortex versus artifact.  2D Echo -  LVEF 60-65%   Doppler of lower extremities - No DVT  Recommend loop recorder to rule out afib on discharge - EP aware  LDL 111  HgbA1c 5.2  VTE prophylaxis - SCD's  aspirin 81 mg daily prior to admission, now on aspirin 81 mg daily and clopidogrel 75 mg daily. Continue DAPT for 3 weeks and then plavix alone. (On Hold as Pt is NPO, Can restart if Pt passes swallow test).   ASA 300 mg suppository until he has po access.  Therapy recommendations: CIR   Disposition:  CIR   Today, 04/26/2020, [name] is being seen for hospital follow-up unaccompanied  Reports residual mild left sided weakness and dysphagia but continued gradual recovery.  Denies residual speech difficulties.  Currently working with [facility] SLP - was cleared by PT/OT.  Recent swallowing study did not show residual dysphagia but reported decreased base of tongue pressure against pharyngeal wall and reduced pharyngeal stripping in general and recommended continued participation with [facility] SLP for further strengthening exercises.  He has remained on a regular diet without difficulty.  He currently lives alone maintaining ADLs and IADLs independently. He requests return back to driving. Concern of blurred vision post stroke per PMR OV note - he does note mild blurred vision but this was present prior to his stroke -he was evaluated by ophthalmology and currently awaiting to receive new prescription glasses.  Loop recorder showed evidence of atrial  fibrillation and RVR on 4/15 and started on Eliquis 5 mg twice daily as well as metoprolol.  He has remained on Eliquis since that time without associated side effects and has scheduled follow-up with cardiology tomorrow.  He has also remained on  Plavix Remains on atorvastatin 80 mg daily without associated side effects Blood pressure today 124/62 - monitors at home and has been stable  He does have history of chronic pain on tramadol PTA which he will use occasionally for pain that interferes with sleeping.  He is currently in the process of switching PCP office and in doing so, has had difficulty obtaining tramadol prescription - he requests short-term refill today until he is able to establish care with new PCP  No further concerns at this time     ROS:   14 system review of systems performed and negative with exception of those listed in HPI  PMH:  Past Medical History:  Diagnosis Date  . Allergy   . Anxiety   . Aortic atherosclerosis ([place])   . Arthritis   . Benign prostatic hyperplasia (BPH) with urinary urgency   . Chronic kidney disease    KIDNEY STONES  . COPD (chronic obstructive pulmonary disease) ([place])    possible   . Coronary artery disease    10/18 PCI/DES to pLAD, mild nonobstructive disease in the Lcx/RCA. Normal EF.   Marland Kitchen Depression   . Dyslipidemia   . Dysrhythmia 2016   irregular heartbeat  . Gallstones   . GERD (gastroesophageal reflux disease)   . History of kidney stones   . Hyperlipidemia   . Hyperthyroidism   . Leg cramps   . Multiple pulmonary nodules   . Myocardial infarction ([place])    2018  . Pneumonia   . Seasonal allergies   . Stroke ([place])   . Thyroid nodule   . Vertigo   . Vitamin B 12 deficiency     PSH:  Past Surgical History:  Procedure Laterality Date  . COLONOSCOPY    . CORONARY ANGIOPLASTY  10/09/2016  . CORONARY STENT INTERVENTION N/A 10/09/2016   Procedure: CORONARY STENT INTERVENTION;  Surgeon: Nelva Bush, MD;  Location: [facility];  Service: Cardiovascular;  Laterality: N/A;  . CYSTOSCOPY WITH RETROGRADE PYELOGRAM, URETEROSCOPY AND STENT PLACEMENT Left 06/17/2019   Procedure: CYSTOSCOPY WITH RETROGRADE PYELOGRAM, URETEROSCOPY AND STENT PLACEMENT;  Surgeon:  Robley Fries, MD;  Location: [facility];  Service: Urology;  Laterality: Left;  1 HR  . HOLMIUM LASER APPLICATION Left 0/05/3974   Procedure: HOLMIUM LASER APPLICATION;  Surgeon: Robley Fries, MD;  Location: [facility];  Service: Urology;  Laterality: Left;  . INTRAVASCULAR ULTRASOUND/IVUS N/A 10/09/2016   Procedure: Intravascular Ultrasound/IVUS;  Surgeon: Nelva Bush, MD;  Location: [facility];  Service: Cardiovascular;  Laterality: N/A;  . IR CHOLANGIOGRAM EXISTING TUBE  11/18/2018  . IR PERC CHOLECYSTOSTOMY  11/16/2018  . LEFT HEART CATH AND CORONARY ANGIOGRAPHY N/A 10/09/2016   Procedure: LEFT HEART CATH AND CORONARY ANGIOGRAPHY;  Surgeon: Nelva Bush, MD;  Location: [facility];  Service: Cardiovascular;  Laterality: N/A;  . LOOP RECORDER INSERTION N/A 02/20/2020   Procedure: LOOP RECORDER INSERTION;  Surgeon: Vickie Epley, MD;  Location: [facility];  Service: Cardiovascular;  Laterality: N/A;    Social History:  Social History   Socioeconomic History  . Marital status: Divorced    Spouse name: Not on file  . Number of children: 0  . Years of education: Not  on file  . Highest education level: Not on file  Occupational History  . Occupation: retired Mudlogger  Tobacco Use  . Smoking status: Current Every Day Smoker    Packs/day: 2.00    Years: 45.00    Pack years: 90.00    Types: Cigarettes    Start date: 01/09/1961  . Smokeless tobacco: Never Used  . Tobacco comment: tobacco info given  Vaping Use  . Vaping Use: Never used  Substance and Sexual Activity  . Alcohol use: No  . Drug use: No  . Sexual activity: Not on file  Other Topics Concern  . Not on file  Social History Narrative  . Not on file   Social Determinants of Health   Financial Resource Strain: Not on file  Food Insecurity: Not on file  Transportation Needs: Not on file  Physical Activity: Not on file  Stress: Not on file  Social Connections: Not on file   Intimate Partner Violence: Not on file    Family History:  Family History  Problem Relation Age of Onset  . Alcohol abuse Father        called [place] per pt  . Cancer Father        OAK CELL CANCER PER PT NON SURGICAL   . Colon cancer Neg Hx   . Colon polyps Neg Hx   . Esophageal cancer Neg Hx   . Rectal cancer Neg Hx   . Stomach cancer Neg Hx     Medications:   Current Outpatient Medications on File Prior to Visit  Medication Sig Dispense Refill  . acetaminophen (TYLENOL) 325 MG tablet Take 2 tablets (650 mg total) by mouth every 4 (four) hours as needed for mild pain (or temp > 37.5 C (99.5 F)).    Marland Kitchen albuterol (VENTOLIN HFA) 108 (90 Base) MCG/ACT inhaler INHALE 2 PUFFS INTO THE LUNGS EVERY SIX HOURS AS NEEDED. 8.5 g 0  . apixaban (ELIQUIS) 5 MG TABS tablet Take 1 tablet (5 mg total) by mouth 2 (two) times daily. 60 tablet 3  . atorvastatin (LIPITOR) 80 MG tablet Take 1 tablet (80 mg total) by mouth daily. 30 tablet 3  . Baclofen 5 MG TABS Take 5 mg by mouth 4 (four) times daily. 120 tablet 5  . citalopram (CELEXA) 10 MG tablet Take 1 tablet (10 mg total) by mouth daily. 30 tablet 5  . clopidogrel (PLAVIX) 75 MG tablet Take 1 tablet (75 mg total) by mouth daily. 30 tablet 1  . clopidogrel (PLAVIX) 75 MG tablet TAKE 1 TABLET (75 MG TOTAL) BY MOUTH DAILY. 30 tablet 1  . diclofenac Sodium (VOLTAREN) 1 % GEL Apply 4 g topically 4 (four) times daily as needed. 200 g 0  . diclofenac Sodium (VOLTAREN) 1 % GEL APPLY 4 G TOPICALLY FOUR TIMES DAILY AS NEEDED. 200 g 0  . dicyclomine (BENTYL) 20 MG tablet Take 1 tablet (20 mg total) by mouth 2 (two) times daily. 20 tablet 0  . lidocaine (LIDODERM) 5 % PLACE 1 PATCH ONTO THE SKIN DAILY. REMOVE & DISCARD PATCH WITHIN 12 HOURS OR AS DIRECTED BY MD (Patient taking differently: 1 patch daily. Remove & discard patch within 12 hours or as directed by MD) 30 patch 0  . magnesium oxide (MAG-OX) 400 (241.3 Mg) MG tablet Take 1 tablet (400 mg  total) by mouth daily. 30 tablet 0  . magnesium oxide (MAG-OX) 400 MG tablet TAKE 1 TABLET (400 MG TOTAL) BY MOUTH DAILY. 30 tablet  0  . melatonin 3 MG TABS tablet Take 1 tablet (3 mg total) by mouth at bedtime. 30 tablet 0  . melatonin 3 MG TABS tablet TAKE 1 TABLET (3 MG TOTAL) BY MOUTH AT BEDTIME. 30 tablet 0  . methimazole (TAPAZOLE) 5 MG tablet TAKE 1 TABLET(5 MG) BY MOUTH DAILY 30 tablet 1  . methimazole (TAPAZOLE) 5 MG tablet TAKE 1 TABLET(5 MG) BY MOUTH DAILY 30 tablet 1  . metoprolol succinate (TOPROL XL) 25 MG 24 hr tablet Take 1 tablet (25 mg total) by mouth daily. 30 tablet 3  . nicotine (NICODERM CQ - DOSED IN MG/24 HOURS) 14 mg/24hr patch APPLY 1 PATCH (14 MG) DAILY FOR THREE WEEKS 21 patch 0  . nicotine (NICODERM CQ - DOSED IN MG/24 HOURS) 21 mg/24hr patch 21 mg patch daily x2 weeks then 14 mg patch daily x3 weeks then 7 mg patch daily x3 weeks and stop 28 patch 0  . nicotine (NICODERM CQ - DOSED IN MG/24 HOURS) 21 mg/24hr patch APPLY 1 PATCH (21 MG) DAILY FOR 2 WEEKS 28 patch 0  . pantoprazole (PROTONIX) 40 MG tablet Take 1 tablet (40 mg total) by mouth daily. 30 tablet 0  . pantoprazole (PROTONIX) 40 MG tablet TAKE 1 TABLET (40 MG TOTAL) BY MOUTH DAILY. 30 tablet 0  . senna-docusate (SENOKOT-S) 8.6-50 MG tablet Take 1 tablet by mouth 2 (two) times daily.    . tamsulosin (FLOMAX) 0.4 MG CAPS capsule Take 2 capsules (0.8 mg total) by mouth daily after supper. 30 capsule 0  . tamsulosin (FLOMAX) 0.4 MG CAPS capsule TAKE 2 CAPSULES (0.8 MG TOTAL) BY MOUTH DAILY AFTER SUPPER. 60 capsule 0  . traMADol (ULTRAM) 50 MG tablet Take 1 tablet (50 mg total) by mouth every 6 (six) hours as needed for severe pain. 30 tablet 0  . traMADol (ULTRAM) 50 MG tablet TAKE 1 TABLET (50 MG TOTAL) BY MOUTH EVERY SIX HOURS AS NEEDED FOR SEVERE PAIN. 30 tablet 0   Current Facility-Administered Medications on File Prior to Visit  Medication Dose Route Frequency Provider Last Rate Last Admin  . cyanocobalamin  ((VITAMIN B-12)) injection 1,000 mcg  1,000 mcg Intramuscular Q30 days Horald Pollen, MD   1,000 mcg at 08/29/19 1525    Allergies:  No Known Allergies    OBJECTIVE:  Physical Exam  Vitals:   04/26/20 1459  BP: 124/62  Pulse: 84  Weight: 225 lb (102.1 kg)  Height: 6' (1.829 m)   Body mass index is 30.52 kg/m. No exam data present  Post stroke PHQ 2/9 Depression screen PHQ 2/9 03/23/2020  Decreased Interest 0  Down, Depressed, Hopeless 0  PHQ - 2 Score 0  Altered sleeping 1  Tired, decreased energy 0  Change in appetite 0  Feeling bad or failure about yourself  0  Trouble concentrating 0  Moving slowly or fidgety/restless 0  Suicidal thoughts 0  PHQ-9 Score 1  Difficult doing work/chores Not difficult at all     General: well developed, well nourished,  very pleasant elderly Caucasian male, seated, in no evident distress Head: head normocephalic and atraumatic.   Neck: supple with no carotid or supraclavicular bruits Cardiovascular: regular rate and rhythm, no murmurs Musculoskeletal: no deformity Skin:  no rash/petichiae Vascular:  Normal pulses all extremities   Neurologic Exam Mental Status: Awake and fully alert.   Mild dysarthria but per patient baseline speech (no teeth or dentures).  Oriented to place and time. Recent and remote memory intact. Attention span,  concentration and fund of knowledge appropriate. Mood and affect appropriate.  Cranial Nerves: Fundoscopic exam reveals sharp disc margins. Pupils equal, briskly reactive to light. Extraocular movements full without nystagmus. Visual fields full to confrontation. Hearing intact. Facial sensation intact. Face, tongue, palate moves normally and symmetrically.  Motor: Normal bulk and tone. Normal strength in all tested extremity muscles except slightly decreased left hand dexterity  Sensory.: intact to touch , pinprick , position and vibratory sensation.  Coordination: Rapid alternating movements  normal in all extremities except slightly decreased left hand dexterity. Finger-to-nose and heel-to-shin performed accurately bilaterally. Gait and Station: Arises from chair without difficulty. Stance is normal. Gait demonstrates normal stride length and balance without use of assistive device. Tandem walk and heel toe moderate difficulty.  Reflexes: 1+ and symmetric. Toes downgoing.     NIHSS  1 Modified Rankin  2      ASSESSMENT: David Hendricks is a 70 y.o. year old male presented with slurred speech, left facial weakness and left arm weakness on 02/15/2020 with stroke work-up revealing acute right MCA cortical and subcortical infarcts, concerning for embolic pattern secondary to unclear source s/p ILR placement. Vascular risk factors include HLD, CAD w/ hx of MI, tobacco use and advanced age.      PLAN:  1. R MCA stroke :  a. Residual deficit: Possible mild dysarthria, decreased left hand dexterity and dysphagia.  Continue participation with [facility] SLP for hopeful further recovery. b. Cleared to return to driving from a stroke standpoint but advised to discuss further driving clearance from his ophthalmologist with c/o blurred vision although reports this as chronic.  Unable to appreciate residual stroke deficit which would impact driving c. Continue Eliquis (apixaban) daily  and atorvastatin for secondary stroke prevention.  Advised continued use of Plavix not indicated from a stroke standpoint -he plans on further discussing with cardiology tomorrow d. Discussed secondary stroke prevention measures and as well as establishing care with new PCP for routine follow-up and aggressive stroke risk factor management e. HLD: LDL goal <70. Recent LDL 111.  On atorvastatin 80 mg daily.  Currently in the process of establishing care with new PCP 2. Atrial fibrillation, new dx: found on ILR on 4/15.  Started on Eliquis with CHA2DS2-VASc score of at least 4. Has f/u with cardiology tomorrow 3. Chronic  pain: On tramadol as needed PTA -currently in the process of transitioning PCP.  Review of PMP.  Agreed to prescribe short duration of tramadol during this transition but advised further refills will need to be obtained by new PCP 4. Tobacco use: Reports complete tobacco cessation since hospitalization -he was congratulated on this and highly encouraged continued abstinence    Follow up in 3 months or call earlier if needed   CC:  [place] provider: Dr. Leonie Man PCP: Horald Pollen, MD    I spent 45 minutes of face-to-face and non-face-to-face time with patient.  This included previsit chart review including recent hospitalization pertinent progress notes, lab work and imaging, lab review, study review, order entry, electronic health record documentation, patient education regarding recent stroke including etiology, residual deficits, new dx of atrial fibrillation and use of Eliquis, importance of managing stroke risk factors and answered all other questions to patient satisfaction   Frann Rider, AGNP-BC  [facility] [address] [place], [place] [address]  Phone [phone] Fax [phone] Note: This document was prepared with digital dictation and possible smart phrase technology. Any transcriptional errors that result from this process are unintentional.

## 2020-04-26 NOTE — Telephone Encounter (Signed)
Patient has been scheduled to see Dr. Quentin Ore tomorrow 04/27/20.

## 2020-04-26 NOTE — Patient Instructions (Addendum)
Continue working with therapies for hopeful ongoing recovery  Continue Eliquis (apixaban) daily  and atorvastatin 80mg  daily  for secondary stroke prevention  No indication to continue plavix and recommend stopping at this time - can further discuss with cardiology tomorrow  Continue to follow up with PCP regarding cholesterol and blood pressure management  Maintain strict control of hypertension with blood pressure goal below 130/90 and cholesterol with LDL cholesterol (bad cholesterol) goal below 70 mg/dL.      Followup in the future with me in 3 months or call earlier if needed      Thank you for coming to see [place] at [facility]. I hope we have been able to provide you high quality care today.  You may receive a patient satisfaction survey over the next few weeks. We would appreciate your feedback and comments so that we may continue to improve ourselves and the health of our patients.    Driving After a Stroke Driving can be dangerous after a stroke because a stroke can cause physical, emotional, cognitive, and behavioral changes. Damage to your brain and other parts of your nervous system may affect your ability to drive. You may have weakness, stiffness, and pain, and have problems moving, talking, seeing, touching, or problem-solving. A stroke can also cause inability to move (paralysis) on one side of your body. Can I return to driving? Ask your health care provider when it is safe for you to drive. Laws on driving after a stroke vary by state. Your health care provider may recommend that you:  Get a driving evaluation to have your vision, thinking, reaction time, and driving skills tested.  Take a driving rehabilitation program for people who have had a stroke.  Take a driving class or a retraining program.   How is driving affected by a stroke? A family member may be the first to notice that it is not safe for you to drive. You may have problems  with:  Your vision.  Talking and communicating.  Weakness, pain, and stiffness in your arms or legs.  Responding to changes on the road.  Using the steering wheel, pedals, and other parts of the car.  Thinking while driving.  Judgment on the road. What are some signs that it may not be safe for me to drive? Signs that driving may be unsafe for you include:  Driving too fast or too slowly.  Needing help from others while driving.  Not paying attention to street signs or signals.  Making bad decisions while driving.  Not keeping enough distance between cars.  Drifting into other lanes.  Becoming confused, angry, or frustrated.  Getting lost in familiar places.  Having accidents while driving. What is adaptive equipment? Adaptive equipment refers to devices that can help people who have had a stroke to drive and do other activities. You may need:  A wheelchair-accessible car.  Special hand controls in the car.  Pedal extensions for the car.  A seat base to help you stay positioned in your seat.  Lifts and ramps to help you get in and out of the car. Summary  Damage to your brain and other parts of your nervous system may affect your ability to drive.  Ask your health care provider when it is safe for you to drive again. You may need to take steps such as getting a driving evaluation or taking a driving class.  A family member may be the first to notice that it is not  safe for you to drive.  You may need adaptive equipment to drive safely. This information is not intended to replace advice given to you by your health care provider. Make sure you discuss any questions you have with your health care provider. Document Revised: 12/08/2016 Document Reviewed: 04/03/2016 Elsevier Patient Education  [place].

## 2020-04-27 ENCOUNTER — Encounter: Payer: Self-pay | Admitting: Cardiology

## 2020-04-27 ENCOUNTER — Ambulatory Visit (INDEPENDENT_AMBULATORY_CARE_PROVIDER_SITE_OTHER): Payer: Medicare Other | Admitting: Cardiology

## 2020-04-27 ENCOUNTER — Other Ambulatory Visit: Payer: Self-pay

## 2020-04-27 VITALS — BP 110/60 | HR 73 | Ht 72.0 in | Wt 229.0 lb

## 2020-04-27 DIAGNOSIS — I639 Cerebral infarction, unspecified: Secondary | ICD-10-CM | POA: Diagnosis not present

## 2020-04-27 DIAGNOSIS — I480 Paroxysmal atrial fibrillation: Secondary | ICD-10-CM | POA: Diagnosis not present

## 2020-04-27 MED ORDER — ASPIRIN EC 81 MG PO TBEC: 81.0000 mg | DELAYED_RELEASE_TABLET | Freq: Every day | ORAL | 3 refills | Status: AC

## 2020-04-27 NOTE — Progress Notes (Signed)
Electrophysiology Office Follow up Visit Note:    Date:  04/27/2020   ID:  David Hendricks, DOB 09/09/1950, MRN [id number]  PCP:  Horald Pollen, MD  [place] Cardiologist:  Larae Grooms, MD  [facility] Electrophysiologist:  Vickie Epley, MD    Interval History:    David Hendricks is a 70 y.o. male who presents for a follow up visit.  I first met him in February of this year when he presented to the hospital with a stroke.  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  On April 15 of this year an episode of atrial fibrillation was detected and he was started on Eliquis.  He is tolerating the Eliquis without bleeding complications since starting it 4 days ago.  He had previously been on dual antiplatelet therapy for his stroke but the Plavix was recently discontinued.  Today he presents for follow-up regarding his atrial fibrillation management.     Past Medical History:  Diagnosis Date  . Allergy   . Anxiety   . Aortic atherosclerosis ([place])   . Arthritis   . Benign prostatic hyperplasia (BPH) with urinary urgency   . Chronic kidney disease    KIDNEY STONES  . COPD (chronic obstructive pulmonary disease) ([place])    possible   . Coronary artery disease    10/18 PCI/DES to pLAD, mild nonobstructive disease in the Lcx/RCA. Normal EF.   Marland Kitchen Depression   . Dyslipidemia   . Dysrhythmia 2016   irregular heartbeat  . Gallstones   . GERD (gastroesophageal reflux disease)   . History of kidney stones   . Hyperlipidemia   . Hyperthyroidism   . Leg cramps   . Multiple pulmonary nodules   . Myocardial infarction ([place])    2018  . Pneumonia   . Seasonal allergies   . Stroke ([place])   . Thyroid nodule   . Vertigo   . Vitamin B 12 deficiency     Past Surgical History:  Procedure Laterality Date  . COLONOSCOPY    . CORONARY ANGIOPLASTY  10/09/2016  . CORONARY STENT INTERVENTION N/A 10/09/2016   Procedure: CORONARY STENT  INTERVENTION;  Surgeon: Nelva Bush, MD;  Location: [place] [facility];  Service: Cardiovascular;  Laterality: N/A;  . CYSTOSCOPY WITH RETROGRADE PYELOGRAM, URETEROSCOPY AND STENT PLACEMENT Left 06/17/2019   Procedure: CYSTOSCOPY WITH RETROGRADE PYELOGRAM, URETEROSCOPY AND STENT PLACEMENT;  Surgeon: Robley Fries, MD;  Location: [facility];  Service: Urology;  Laterality: Left;  1 HR  . HOLMIUM LASER APPLICATION Left 0/01/69   Procedure: HOLMIUM LASER APPLICATION;  Surgeon: Robley Fries, MD;  Location: [facility];  Service: Urology;  Laterality: Left;  . INTRAVASCULAR ULTRASOUND/IVUS N/A 10/09/2016   Procedure: Intravascular Ultrasound/IVUS;  Surgeon: Nelva Bush, MD;  Location: [place] [facility];  Service: Cardiovascular;  Laterality: N/A;  . IR CHOLANGIOGRAM EXISTING TUBE  11/18/2018  . IR PERC CHOLECYSTOSTOMY  11/16/2018  . LEFT HEART CATH AND CORONARY ANGIOGRAPHY N/A 10/09/2016   Procedure: LEFT HEART CATH AND CORONARY ANGIOGRAPHY;  Surgeon: Nelva Bush, MD;  Location: [place] [facility];  Service: Cardiovascular;  Laterality: N/A;  . LOOP RECORDER INSERTION N/A 02/20/2020   Procedure: LOOP RECORDER INSERTION;  Surgeon: Vickie Epley, MD;  Location: [place] [facility];  Service: Cardiovascular;  Laterality: N/A;    Current Medications: Current Meds  Medication Sig  . acetaminophen (TYLENOL) 325 MG tablet Take 2 tablets (650 mg total) by mouth  every 4 (four) hours as needed for mild pain (or temp > 37.5 C (99.5 F)).  Marland Kitchen albuterol (VENTOLIN HFA) 108 (90 Base) MCG/ACT inhaler INHALE 2 PUFFS INTO THE LUNGS EVERY SIX HOURS AS NEEDED.  Marland Kitchen apixaban (ELIQUIS) 5 MG TABS tablet Take 1 tablet (5 mg total) by mouth 2 (two) times daily.  Marland Kitchen aspirin EC 81 MG tablet Take 1 tablet (81 mg total) by mouth daily. Swallow whole.  Marland Kitchen atorvastatin (LIPITOR) 80 MG tablet Take 1 tablet (80 mg total) by mouth daily.  . Baclofen 5 MG TABS Take 5 mg by mouth 4 (four) times daily.  . citalopram (CELEXA)  10 MG tablet Take 1 tablet (10 mg total) by mouth daily.  . cyclobenzaprine (FLEXERIL) 5 MG tablet Take 5 mg by mouth 3 (three) times daily as needed for muscle spasms.  . diclofenac Sodium (VOLTAREN) 1 % GEL Apply 4 g topically 4 (four) times daily as needed.  . dicyclomine (BENTYL) 20 MG tablet Take 1 tablet (20 mg total) by mouth 2 (two) times daily.  Marland Kitchen lidocaine (LIDODERM) 5 % PLACE 1 PATCH ONTO THE SKIN DAILY. REMOVE & DISCARD PATCH WITHIN 12 HOURS OR AS DIRECTED BY MD (Patient taking differently: 1 patch daily. Remove & discard patch within 12 hours or as directed by MD)  . magnesium oxide (MAG-OX) 400 (241.3 Mg) MG tablet Take 1 tablet (400 mg total) by mouth daily.  . melatonin 3 MG TABS tablet Take 1 tablet (3 mg total) by mouth at bedtime.  . methimazole (TAPAZOLE) 5 MG tablet TAKE 1 TABLET(5 MG) BY MOUTH DAILY  . metoprolol succinate (TOPROL XL) 25 MG 24 hr tablet Take 1 tablet (25 mg total) by mouth daily.  . nicotine (NICODERM CQ - DOSED IN MG/24 HOURS) 21 mg/24hr patch 21 mg patch daily x2 weeks then 14 mg patch daily x3 weeks then 7 mg patch daily x3 weeks and stop  . pantoprazole (PROTONIX) 40 MG tablet Take 1 tablet (40 mg total) by mouth daily.  Marland Kitchen senna-docusate (SENOKOT-S) 8.6-50 MG tablet Take 1 tablet by mouth 2 (two) times daily.  . tamsulosin (FLOMAX) 0.4 MG CAPS capsule Take 2 capsules (0.8 mg total) by mouth daily after supper.  . traMADol (ULTRAM) 50 MG tablet Take 1 tablet (50 mg total) by mouth every 12 (twelve) hours as needed for severe pain.   Current Facility-Administered Medications for the 04/27/20 encounter (Office Visit) with Vickie Epley, MD  Medication  . cyanocobalamin ((VITAMIN B-12)) injection 1,000 mcg     Allergies:   Patient has no known allergies.   Social History   Socioeconomic History  . Marital status: Divorced    Spouse name: Not on file  . Number of children: 0  . Years of education: Not on file  . Highest education level: Not on  file  Occupational History  . Occupation: retired Mudlogger  Tobacco Use  . Smoking status: Current Every Day Smoker    Packs/day: 2.00    Years: 45.00    Pack years: 90.00    Types: Cigarettes    Start date: 01/09/1961  . Smokeless tobacco: Never Used  . Tobacco comment: tobacco info given  Vaping Use  . Vaping Use: Never used  Substance and Sexual Activity  . Alcohol use: No  . Drug use: No  . Sexual activity: Not on file  Other Topics Concern  . Not on file  Social History Narrative  . Not on file   Social Determinants of  Health   Financial Resource Strain: Not on file  Food Insecurity: Not on file  Transportation Needs: Not on file  Physical Activity: Not on file  Stress: Not on file  Social Connections: Not on file     Family History: The patient's family history includes Alcohol abuse in his father; Cancer in his father. There is no history of Colon cancer, Colon polyps, Esophageal cancer, Rectal cancer, or Stomach cancer.  ROS:   Please see the history of present illness.    All other systems reviewed and are negative.  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  Recent Labs: 02/15/2020: TSH 0.919 04/05/2020: ALT 22; BUN 13; Creatinine, Ser 1.05; Hemoglobin 17.0; Platelets 162; Potassium 4.4; Sodium 138  Recent Lipid Panel    Component Value Date/Time   CHOL 168 02/16/2020 0500   CHOL 106 12/04/2016 1029   TRIG 80 02/16/2020 0500   HDL 41 02/16/2020 0500   HDL 45 12/04/2016 1029   CHOLHDL 4.1 02/16/2020 0500   VLDL 16 02/16/2020 0500   LDLCALC 111 (H) 02/16/2020 0500   LDLCALC 38 12/04/2016 1029    Physical Exam:    VS:  BP 110/60 (BP Location: Left Arm, Patient Position: Sitting, Cuff Size: Normal)   Pulse 73   Ht 6' (1.829 m)   Wt 229 lb (103.9 kg)   SpO2 92%   BMI 31.06 kg/m      Wt Readings from Last 3 Encounters:  04/27/20 229 lb (103.9 kg)  04/26/20 225 lb (102.1 kg)  04/22/20 221 lb (100.2 kg)     GEN:  Well nourished, well developed in no acute distress HEENT: Normal NECK: No JVD; No carotid bruits LYMPHATICS: No lymphadenopathy CARDIAC: RRR, no murmurs, rubs, gallops RESPIRATORY:  Clear to auscultation without rales, wheezing or rhonchi  ABDOMEN: Soft, non-tender, non-distended MUSCULOSKELETAL:  No edema; No deformity  SKIN: Warm and dry NEUROLOGIC:  Alert and oriented x 3 PSYCHIATRIC:  Normal affect   ASSESSMENT:    1. Cerebrovascular accident (CVA), unspecified mechanism ([place])   2. Paroxysmal atrial fibrillation ([place])    PLAN:    In order of problems listed above:  1. Paroxysmal atrial fibrillation On Eliquis for stroke prophylaxis.  Now in normal rhythm.  It appears that his burden of atrial fibrillation is relatively low but I will need to see some more data from his loop recorder to better quantify.  I would like to see him back in about 4 months to review his loop recorder data.  Thankfully, the patient is asymptomatic with his atrial fibrillation.  For now, continue the Eliquis.  I will stop his Plavix and continue him on aspirin 81 mg by mouth daily for his history of coronary artery disease with stenting.  I discussed the case with Dr. Irish Lack who is in agreement.  2.  CVA Likely embolic given his history of atrial fibrillation.  Continue Eliquis for stroke secondary prophylaxis.  Continue aspirin 81.  Total time spent with patient today 45  minutes. This includes reviewing records, evaluating the patient and coordinating care.   Medication Adjustments/Labs and Tests Ordered: Current medicines are reviewed at length with the patient today.  Concerns regarding medicines are outlined above.  Orders Placed This Encounter  Procedures  . EKG 12-Lead   Meds ordered this encounter  Medications  . aspirin EC 81 MG  tablet    Sig: Take  1 tablet (81 mg total) by mouth daily. Swallow whole.    Dispense:  90 tablet    Refill:  3     Signed, Lars Mage, MD, [facility], [facility] 04/27/2020 7:02 PM    Electrophysiology [facility]

## 2020-04-27 NOTE — Patient Instructions (Addendum)
Medication Instructions:   Your physician has recommended you make the following change in your medication:   1.  STOP PLAVIX (clopidegral)   2.  START taking aspirin 81 mg one tablet by mouth daily  3.  Continue taking Eliquis 5 mg one tablet by mouth TWICE a day  *If you need a refill on your cardiac medications before your next appointment, please call your pharmacy*  Lab Work: None ordered. If you have labs (blood work) drawn today and your tests are completely normal, you will receive your results only by: Marland Kitchen MyChart Message (if you have MyChart) OR . A paper copy in the mail If you have any lab test that is abnormal or we need to change your treatment, we will call you to review the results.  Testing/Procedures: None ordered.  Follow-Up: At [facility], you and your health needs are our priority.  As part of our continuing mission to provide you with exceptional heart care, we have created designated Provider Care Teams.  These Care Teams include your primary Cardiologist (physician) and Advanced Practice Providers (APPs -  Physician Assistants and Nurse Practitioners) who all work together to provide you with the care you need, when you need it.  Your next appointment:   Your physician wants you to follow-up in: 4 months with Dr. Quentin Ore.     August 24, 2020 at 1:45 PM at the [facility] office  Remote monitoring is used to monitor your implantable loop recorder.

## 2020-04-28 DIAGNOSIS — I69391 Dysphagia following cerebral infarction: Secondary | ICD-10-CM | POA: Diagnosis not present

## 2020-04-28 DIAGNOSIS — B370 Candidal stomatitis: Secondary | ICD-10-CM | POA: Diagnosis not present

## 2020-04-28 DIAGNOSIS — I2510 Atherosclerotic heart disease of native coronary artery without angina pectoris: Secondary | ICD-10-CM | POA: Diagnosis not present

## 2020-04-28 DIAGNOSIS — I69352 Hemiplegia and hemiparesis following cerebral infarction affecting left dominant side: Secondary | ICD-10-CM | POA: Diagnosis not present

## 2020-04-28 DIAGNOSIS — J449 Chronic obstructive pulmonary disease, unspecified: Secondary | ICD-10-CM | POA: Diagnosis not present

## 2020-04-28 DIAGNOSIS — R1312 Dysphagia, oropharyngeal phase: Secondary | ICD-10-CM | POA: Diagnosis not present

## 2020-05-03 ENCOUNTER — Ambulatory Visit (INDEPENDENT_AMBULATORY_CARE_PROVIDER_SITE_OTHER): Payer: Medicare Other

## 2020-05-03 DIAGNOSIS — I639 Cerebral infarction, unspecified: Secondary | ICD-10-CM

## 2020-05-03 NOTE — Progress Notes (Signed)
I agree with the above plan 

## 2020-05-04 DIAGNOSIS — I2510 Atherosclerotic heart disease of native coronary artery without angina pectoris: Secondary | ICD-10-CM | POA: Diagnosis not present

## 2020-05-04 DIAGNOSIS — R1312 Dysphagia, oropharyngeal phase: Secondary | ICD-10-CM | POA: Diagnosis not present

## 2020-05-04 DIAGNOSIS — B370 Candidal stomatitis: Secondary | ICD-10-CM | POA: Diagnosis not present

## 2020-05-04 DIAGNOSIS — I69352 Hemiplegia and hemiparesis following cerebral infarction affecting left dominant side: Secondary | ICD-10-CM | POA: Diagnosis not present

## 2020-05-04 DIAGNOSIS — I69391 Dysphagia following cerebral infarction: Secondary | ICD-10-CM | POA: Diagnosis not present

## 2020-05-04 DIAGNOSIS — J449 Chronic obstructive pulmonary disease, unspecified: Secondary | ICD-10-CM | POA: Diagnosis not present

## 2020-05-07 DIAGNOSIS — J449 Chronic obstructive pulmonary disease, unspecified: Secondary | ICD-10-CM | POA: Diagnosis not present

## 2020-05-07 DIAGNOSIS — I69391 Dysphagia following cerebral infarction: Secondary | ICD-10-CM | POA: Diagnosis not present

## 2020-05-07 DIAGNOSIS — I69352 Hemiplegia and hemiparesis following cerebral infarction affecting left dominant side: Secondary | ICD-10-CM | POA: Diagnosis not present

## 2020-05-07 DIAGNOSIS — I2510 Atherosclerotic heart disease of native coronary artery without angina pectoris: Secondary | ICD-10-CM | POA: Diagnosis not present

## 2020-05-07 DIAGNOSIS — R1312 Dysphagia, oropharyngeal phase: Secondary | ICD-10-CM | POA: Diagnosis not present

## 2020-05-07 DIAGNOSIS — B370 Candidal stomatitis: Secondary | ICD-10-CM | POA: Diagnosis not present

## 2020-05-08 DIAGNOSIS — Z1211 Encounter for screening for malignant neoplasm of colon: Secondary | ICD-10-CM | POA: Diagnosis not present

## 2020-05-08 DIAGNOSIS — Z0001 Encounter for general adult medical examination with abnormal findings: Secondary | ICD-10-CM | POA: Diagnosis not present

## 2020-05-08 DIAGNOSIS — I639 Cerebral infarction, unspecified: Secondary | ICD-10-CM | POA: Diagnosis not present

## 2020-05-08 DIAGNOSIS — I4891 Unspecified atrial fibrillation: Secondary | ICD-10-CM | POA: Diagnosis not present

## 2020-05-08 DIAGNOSIS — F5221 Male erectile disorder: Secondary | ICD-10-CM | POA: Diagnosis not present

## 2020-05-08 DIAGNOSIS — I10 Essential (primary) hypertension: Secondary | ICD-10-CM | POA: Diagnosis not present

## 2020-05-10 DIAGNOSIS — B370 Candidal stomatitis: Secondary | ICD-10-CM | POA: Diagnosis not present

## 2020-05-10 DIAGNOSIS — R1312 Dysphagia, oropharyngeal phase: Secondary | ICD-10-CM | POA: Diagnosis not present

## 2020-05-10 DIAGNOSIS — J449 Chronic obstructive pulmonary disease, unspecified: Secondary | ICD-10-CM | POA: Diagnosis not present

## 2020-05-10 DIAGNOSIS — I2510 Atherosclerotic heart disease of native coronary artery without angina pectoris: Secondary | ICD-10-CM | POA: Diagnosis not present

## 2020-05-10 DIAGNOSIS — I69352 Hemiplegia and hemiparesis following cerebral infarction affecting left dominant side: Secondary | ICD-10-CM | POA: Diagnosis not present

## 2020-05-10 DIAGNOSIS — I69391 Dysphagia following cerebral infarction: Secondary | ICD-10-CM | POA: Diagnosis not present

## 2020-05-10 LAB — CUP PACEART REMOTE DEVICE CHECK
Date Time Interrogation Session: 20220422162045
Implantable Pulse Generator Implant Date: 20220211

## 2020-05-11 DIAGNOSIS — J449 Chronic obstructive pulmonary disease, unspecified: Secondary | ICD-10-CM | POA: Diagnosis not present

## 2020-05-11 DIAGNOSIS — R1312 Dysphagia, oropharyngeal phase: Secondary | ICD-10-CM | POA: Diagnosis not present

## 2020-05-11 DIAGNOSIS — I2510 Atherosclerotic heart disease of native coronary artery without angina pectoris: Secondary | ICD-10-CM | POA: Diagnosis not present

## 2020-05-11 DIAGNOSIS — I69352 Hemiplegia and hemiparesis following cerebral infarction affecting left dominant side: Secondary | ICD-10-CM | POA: Diagnosis not present

## 2020-05-11 DIAGNOSIS — B370 Candidal stomatitis: Secondary | ICD-10-CM | POA: Diagnosis not present

## 2020-05-11 DIAGNOSIS — I69391 Dysphagia following cerebral infarction: Secondary | ICD-10-CM | POA: Diagnosis not present

## 2020-05-12 DIAGNOSIS — I69391 Dysphagia following cerebral infarction: Secondary | ICD-10-CM | POA: Diagnosis not present

## 2020-05-12 DIAGNOSIS — N189 Chronic kidney disease, unspecified: Secondary | ICD-10-CM | POA: Diagnosis not present

## 2020-05-12 DIAGNOSIS — E0590 Thyrotoxicosis, unspecified without thyrotoxic crisis or storm: Secondary | ICD-10-CM | POA: Diagnosis not present

## 2020-05-12 DIAGNOSIS — I252 Old myocardial infarction: Secondary | ICD-10-CM | POA: Diagnosis not present

## 2020-05-12 DIAGNOSIS — R1312 Dysphagia, oropharyngeal phase: Secondary | ICD-10-CM | POA: Diagnosis not present

## 2020-05-12 DIAGNOSIS — M1990 Unspecified osteoarthritis, unspecified site: Secondary | ICD-10-CM | POA: Diagnosis not present

## 2020-05-12 DIAGNOSIS — R918 Other nonspecific abnormal finding of lung field: Secondary | ICD-10-CM | POA: Diagnosis not present

## 2020-05-12 DIAGNOSIS — J449 Chronic obstructive pulmonary disease, unspecified: Secondary | ICD-10-CM | POA: Diagnosis not present

## 2020-05-12 DIAGNOSIS — I700 Atherosclerosis of aorta: Secondary | ICD-10-CM | POA: Diagnosis not present

## 2020-05-12 DIAGNOSIS — E039 Hypothyroidism, unspecified: Secondary | ICD-10-CM | POA: Diagnosis not present

## 2020-05-12 DIAGNOSIS — M25511 Pain in right shoulder: Secondary | ICD-10-CM | POA: Diagnosis not present

## 2020-05-12 DIAGNOSIS — F419 Anxiety disorder, unspecified: Secondary | ICD-10-CM | POA: Diagnosis not present

## 2020-05-12 DIAGNOSIS — B370 Candidal stomatitis: Secondary | ICD-10-CM | POA: Diagnosis not present

## 2020-05-12 DIAGNOSIS — I2510 Atherosclerotic heart disease of native coronary artery without angina pectoris: Secondary | ICD-10-CM | POA: Diagnosis not present

## 2020-05-12 DIAGNOSIS — R3915 Urgency of urination: Secondary | ICD-10-CM | POA: Diagnosis not present

## 2020-05-12 DIAGNOSIS — K219 Gastro-esophageal reflux disease without esophagitis: Secondary | ICD-10-CM | POA: Diagnosis not present

## 2020-05-12 DIAGNOSIS — F17210 Nicotine dependence, cigarettes, uncomplicated: Secondary | ICD-10-CM | POA: Diagnosis not present

## 2020-05-12 DIAGNOSIS — I4891 Unspecified atrial fibrillation: Secondary | ICD-10-CM | POA: Diagnosis not present

## 2020-05-12 DIAGNOSIS — Z7902 Long term (current) use of antithrombotics/antiplatelets: Secondary | ICD-10-CM | POA: Diagnosis not present

## 2020-05-12 DIAGNOSIS — F32A Depression, unspecified: Secondary | ICD-10-CM | POA: Diagnosis not present

## 2020-05-12 DIAGNOSIS — N401 Enlarged prostate with lower urinary tract symptoms: Secondary | ICD-10-CM | POA: Diagnosis not present

## 2020-05-12 DIAGNOSIS — K5900 Constipation, unspecified: Secondary | ICD-10-CM | POA: Diagnosis not present

## 2020-05-12 DIAGNOSIS — G8929 Other chronic pain: Secondary | ICD-10-CM | POA: Diagnosis not present

## 2020-05-12 DIAGNOSIS — I69352 Hemiplegia and hemiparesis following cerebral infarction affecting left dominant side: Secondary | ICD-10-CM | POA: Diagnosis not present

## 2020-05-12 DIAGNOSIS — E785 Hyperlipidemia, unspecified: Secondary | ICD-10-CM | POA: Diagnosis not present

## 2020-05-13 ENCOUNTER — Telehealth: Payer: Self-pay

## 2020-05-13 NOTE — Telephone Encounter (Signed)
Patient reports he awoke at 3am with chest pain starting in his left ear and extending down his chest.  He denies any other symptoms.  He did not take any medications at that time.  He pushed the button on his bedside monitor and called our office leaving a VM.  This was what he was instructed to do by his nurse "Stanton Kidney".    Patient reports the pain lasted for about an hour or so.  At current time patient denies any current symptoms or pain.    Pt reports he does have NTG to take but did not think to take it last night.  Educated patient on use of NTG and ED precautions to be taken if ineffective.  Chest pain lasting longer than a few minutes needs to be examined in ED.    LINQ report received at 0427 this morning shows NSR and no arrythmias.

## 2020-05-13 NOTE — Telephone Encounter (Signed)
Patient called in after hours left a vm stating he is/was having chest pains from L ear to bottom of L chest and he has sent a transmission. Please call patient back to reassure

## 2020-05-18 DIAGNOSIS — J449 Chronic obstructive pulmonary disease, unspecified: Secondary | ICD-10-CM | POA: Diagnosis not present

## 2020-05-18 DIAGNOSIS — R1312 Dysphagia, oropharyngeal phase: Secondary | ICD-10-CM | POA: Diagnosis not present

## 2020-05-18 DIAGNOSIS — I69352 Hemiplegia and hemiparesis following cerebral infarction affecting left dominant side: Secondary | ICD-10-CM | POA: Diagnosis not present

## 2020-05-18 DIAGNOSIS — I2510 Atherosclerotic heart disease of native coronary artery without angina pectoris: Secondary | ICD-10-CM | POA: Diagnosis not present

## 2020-05-18 DIAGNOSIS — I4891 Unspecified atrial fibrillation: Secondary | ICD-10-CM | POA: Diagnosis not present

## 2020-05-18 DIAGNOSIS — I69391 Dysphagia following cerebral infarction: Secondary | ICD-10-CM | POA: Diagnosis not present

## 2020-05-19 NOTE — Progress Notes (Signed)
[facility] Summary Report / Loop Recorder 

## 2020-05-20 DIAGNOSIS — J449 Chronic obstructive pulmonary disease, unspecified: Secondary | ICD-10-CM | POA: Diagnosis not present

## 2020-05-20 DIAGNOSIS — I69352 Hemiplegia and hemiparesis following cerebral infarction affecting left dominant side: Secondary | ICD-10-CM | POA: Diagnosis not present

## 2020-05-20 DIAGNOSIS — I2510 Atherosclerotic heart disease of native coronary artery without angina pectoris: Secondary | ICD-10-CM | POA: Diagnosis not present

## 2020-05-20 DIAGNOSIS — I4891 Unspecified atrial fibrillation: Secondary | ICD-10-CM | POA: Diagnosis not present

## 2020-05-20 DIAGNOSIS — I69391 Dysphagia following cerebral infarction: Secondary | ICD-10-CM | POA: Diagnosis not present

## 2020-05-20 DIAGNOSIS — R1312 Dysphagia, oropharyngeal phase: Secondary | ICD-10-CM | POA: Diagnosis not present

## 2020-05-27 DIAGNOSIS — J449 Chronic obstructive pulmonary disease, unspecified: Secondary | ICD-10-CM | POA: Diagnosis not present

## 2020-05-27 DIAGNOSIS — R1312 Dysphagia, oropharyngeal phase: Secondary | ICD-10-CM | POA: Diagnosis not present

## 2020-05-27 DIAGNOSIS — I69391 Dysphagia following cerebral infarction: Secondary | ICD-10-CM | POA: Diagnosis not present

## 2020-05-27 DIAGNOSIS — I2510 Atherosclerotic heart disease of native coronary artery without angina pectoris: Secondary | ICD-10-CM | POA: Diagnosis not present

## 2020-05-27 DIAGNOSIS — I4891 Unspecified atrial fibrillation: Secondary | ICD-10-CM | POA: Diagnosis not present

## 2020-05-27 DIAGNOSIS — I69352 Hemiplegia and hemiparesis following cerebral infarction affecting left dominant side: Secondary | ICD-10-CM | POA: Diagnosis not present

## 2020-06-02 ENCOUNTER — Ambulatory Visit: Payer: Medicare Other | Admitting: Physician Assistant

## 2020-06-02 DIAGNOSIS — I69352 Hemiplegia and hemiparesis following cerebral infarction affecting left dominant side: Secondary | ICD-10-CM | POA: Diagnosis not present

## 2020-06-02 DIAGNOSIS — I4891 Unspecified atrial fibrillation: Secondary | ICD-10-CM | POA: Diagnosis not present

## 2020-06-02 DIAGNOSIS — R1312 Dysphagia, oropharyngeal phase: Secondary | ICD-10-CM | POA: Diagnosis not present

## 2020-06-02 DIAGNOSIS — I69391 Dysphagia following cerebral infarction: Secondary | ICD-10-CM | POA: Diagnosis not present

## 2020-06-02 DIAGNOSIS — J449 Chronic obstructive pulmonary disease, unspecified: Secondary | ICD-10-CM | POA: Diagnosis not present

## 2020-06-02 DIAGNOSIS — I2510 Atherosclerotic heart disease of native coronary artery without angina pectoris: Secondary | ICD-10-CM | POA: Diagnosis not present

## 2020-06-03 ENCOUNTER — Ambulatory Visit (INDEPENDENT_AMBULATORY_CARE_PROVIDER_SITE_OTHER): Payer: Medicare Other

## 2020-06-03 DIAGNOSIS — I639 Cerebral infarction, unspecified: Secondary | ICD-10-CM | POA: Diagnosis not present

## 2020-06-03 LAB — CUP PACEART REMOTE DEVICE CHECK
Date Time Interrogation Session: 20220525161632
Implantable Pulse Generator Implant Date: 20220211

## 2020-06-05 DIAGNOSIS — M792 Neuralgia and neuritis, unspecified: Secondary | ICD-10-CM | POA: Diagnosis not present

## 2020-06-05 DIAGNOSIS — E0590 Thyrotoxicosis, unspecified without thyrotoxic crisis or storm: Secondary | ICD-10-CM | POA: Diagnosis not present

## 2020-06-05 DIAGNOSIS — M545 Low back pain, unspecified: Secondary | ICD-10-CM | POA: Diagnosis not present

## 2020-06-05 DIAGNOSIS — G4700 Insomnia, unspecified: Secondary | ICD-10-CM | POA: Diagnosis not present

## 2020-06-11 DIAGNOSIS — M549 Dorsalgia, unspecified: Secondary | ICD-10-CM | POA: Diagnosis not present

## 2020-06-15 ENCOUNTER — Other Ambulatory Visit: Payer: Medicare Other

## 2020-06-16 ENCOUNTER — Emergency Department (HOSPITAL_COMMUNITY)
Admission: EM | Admit: 2020-06-16 | Discharge: 2020-06-16 | Disposition: A | Discharge status: Home / Self Care | Payer: Medicare Other | Attending: Emergency Medicine | Admitting: Emergency Medicine

## 2020-06-16 ENCOUNTER — Emergency Department (HOSPITAL_COMMUNITY): Payer: Medicare Other

## 2020-06-16 ENCOUNTER — Other Ambulatory Visit: Payer: Medicare Other

## 2020-06-16 ENCOUNTER — Encounter (HOSPITAL_COMMUNITY): Payer: Self-pay

## 2020-06-16 DIAGNOSIS — M791 Myalgia, unspecified site: Secondary | ICD-10-CM | POA: Insufficient documentation

## 2020-06-16 DIAGNOSIS — J80 Acute respiratory distress syndrome: Secondary | ICD-10-CM | POA: Diagnosis not present

## 2020-06-16 DIAGNOSIS — R059 Cough, unspecified: Secondary | ICD-10-CM | POA: Insufficient documentation

## 2020-06-16 DIAGNOSIS — N189 Chronic kidney disease, unspecified: Secondary | ICD-10-CM | POA: Insufficient documentation

## 2020-06-16 DIAGNOSIS — I2510 Atherosclerotic heart disease of native coronary artery without angina pectoris: Secondary | ICD-10-CM | POA: Diagnosis not present

## 2020-06-16 DIAGNOSIS — Z7901 Long term (current) use of anticoagulants: Secondary | ICD-10-CM | POA: Diagnosis not present

## 2020-06-16 DIAGNOSIS — R519 Headache, unspecified: Secondary | ICD-10-CM | POA: Diagnosis not present

## 2020-06-16 DIAGNOSIS — Z8673 Personal history of transient ischemic attack (TIA), and cerebral infarction without residual deficits: Secondary | ICD-10-CM | POA: Insufficient documentation

## 2020-06-16 DIAGNOSIS — Z7982 Long term (current) use of aspirin: Secondary | ICD-10-CM | POA: Insufficient documentation

## 2020-06-16 DIAGNOSIS — Z20822 Contact with and (suspected) exposure to covid-19: Secondary | ICD-10-CM | POA: Diagnosis not present

## 2020-06-16 DIAGNOSIS — Z951 Presence of aortocoronary bypass graft: Secondary | ICD-10-CM | POA: Insufficient documentation

## 2020-06-16 DIAGNOSIS — J449 Chronic obstructive pulmonary disease, unspecified: Secondary | ICD-10-CM | POA: Diagnosis not present

## 2020-06-16 DIAGNOSIS — R0789 Other chest pain: Secondary | ICD-10-CM | POA: Diagnosis not present

## 2020-06-16 DIAGNOSIS — I700 Atherosclerosis of aorta: Secondary | ICD-10-CM | POA: Diagnosis not present

## 2020-06-16 DIAGNOSIS — Z79899 Other long term (current) drug therapy: Secondary | ICD-10-CM | POA: Diagnosis not present

## 2020-06-16 DIAGNOSIS — F17210 Nicotine dependence, cigarettes, uncomplicated: Secondary | ICD-10-CM | POA: Insufficient documentation

## 2020-06-16 DIAGNOSIS — R0600 Dyspnea, unspecified: Secondary | ICD-10-CM | POA: Diagnosis not present

## 2020-06-16 DIAGNOSIS — R0981 Nasal congestion: Secondary | ICD-10-CM | POA: Diagnosis not present

## 2020-06-16 DIAGNOSIS — R0602 Shortness of breath: Secondary | ICD-10-CM | POA: Diagnosis not present

## 2020-06-16 DIAGNOSIS — R0689 Other abnormalities of breathing: Secondary | ICD-10-CM | POA: Diagnosis not present

## 2020-06-16 DIAGNOSIS — R531 Weakness: Secondary | ICD-10-CM | POA: Diagnosis not present

## 2020-06-16 DIAGNOSIS — R079 Chest pain, unspecified: Secondary | ICD-10-CM | POA: Diagnosis not present

## 2020-06-16 LAB — CBC WITH DIFFERENTIAL/PLATELET
Abs Immature Granulocytes: 0.04 10*3/uL (ref 0.00–0.07)
Basophils Absolute: 0.1 10*3/uL (ref 0.0–0.1)
Basophils Relative: 1 %
Eosinophils Absolute: 0.3 10*3/uL (ref 0.0–0.5)
Eosinophils Relative: 4 %
HCT: 45.2 % (ref 39.0–52.0)
Hemoglobin: 15 g/dL (ref 13.0–17.0)
Immature Granulocytes: 1 %
Lymphocytes Relative: 26 %
Lymphs Abs: 2 10*3/uL (ref 0.7–4.0)
MCH: 29 pg (ref 26.0–34.0)
MCHC: 33.2 g/dL (ref 30.0–36.0)
MCV: 87.4 fL (ref 80.0–100.0)
Monocytes Absolute: 0.5 10*3/uL (ref 0.1–1.0)
Monocytes Relative: 6 %
Neutro Abs: 4.8 10*3/uL (ref 1.7–7.7)
Neutrophils Relative %: 62 %
Platelets: 137 10*3/uL — ABNORMAL LOW (ref 150–400)
RBC: 5.17 MIL/uL (ref 4.22–5.81)
RDW: 13.7 % (ref 11.5–15.5)
WBC: 7.7 10*3/uL (ref 4.0–10.5)
nRBC: 0 % (ref 0.0–0.2)

## 2020-06-16 LAB — RESP PANEL BY RT-PCR (FLU A&B, COVID) ARPGX2
Influenza A by PCR: NEGATIVE
Influenza B by PCR: NEGATIVE
SARS Coronavirus 2 by RT PCR: NEGATIVE

## 2020-06-16 LAB — BASIC METABOLIC PANEL
Anion gap: 7 (ref 5–15)
BUN: 11 mg/dL (ref 8–23)
CO2: 27 mmol/L (ref 22–32)
Calcium: 8.7 mg/dL — ABNORMAL LOW (ref 8.9–10.3)
Chloride: 107 mmol/L (ref 98–111)
Creatinine, Ser: 0.97 mg/dL (ref 0.61–1.24)
GFR, Estimated: >60 mL/min (ref >60)
Glucose, Bld: 93 mg/dL (ref 70–99)
Potassium: 4.1 mmol/L (ref 3.5–5.1)
Sodium: 141 mmol/L (ref 135–145)

## 2020-06-16 NOTE — Discharge Instructions (Addendum)
You were seen in the ER today for your body aches, cough, and chills.  Your physical exam and vital signs are reassuring as is your chest x-ray and blood work.  You may follow-up on her test results for COVID-19 in the MyChart app; please see instructions in this packet for information on how to set up your account.  Return to the ER if you develop any worsening chest pain, shortness of breath, palpitations, nausea or vomiting does not stop, or any other new severe symptoms.

## 2020-06-16 NOTE — ED Notes (Signed)
Patient ambulated with no dizziness or unsteadiness, maintained O2 sats between 91-96% with good waveform observed.

## 2020-06-16 NOTE — ED Provider Notes (Signed)
[facility] DEPT Provider Note   CSN: [id number] Arrival date & time: 06/16/20  1503     History Chief Complaint  Patient presents with  . Cough    David Hendricks is a 70 y.o. male who presents with 24 hours of nonproductive cough, nasal congestion, intermittent shortness of breath, and myalgias.  Patient is vaccinating is COVID-19 and history of COPD.  Presenting primarily for with request for COVID-19 test.  Denies any fevers at home but does endorse some chills.  Denies nausea, vomiting, diarrhea, urinary symptoms.  Endorses headache but denies blurry or double vision, dizziness.  I personally reviewed the patient medical records.  He has history of NSTEMI, coronary artery disease, BPH, smoking, CVA, COPD with multiple pulmonary nodules.  He is anticoagulated with Eliquis.  HPI     Past Medical History:  Diagnosis Date  . Allergy   . Anxiety   . Aortic atherosclerosis ([place])   . Arthritis   . Benign prostatic hyperplasia (BPH) with urinary urgency   . Chronic kidney disease    KIDNEY STONES  . COPD (chronic obstructive pulmonary disease) ([place])    possible   . COPD (chronic obstructive pulmonary disease) ([place])   . Coronary artery disease    10/18 PCI/DES to pLAD, mild nonobstructive disease in the Lcx/RCA. Normal EF.   [place] Kitchen Depression   . Dyslipidemia   . Dysrhythmia 2016   irregular heartbeat  . Gallstones   . GERD (gastroesophageal reflux disease)   . History of kidney stones   . Hyperlipidemia   . Hyperthyroidism   . Leg cramps   . Multiple pulmonary nodules   . Myocardial infarction ([place])    2018  . Pneumonia   . Seasonal allergies   . Stroke ([place])   . Thyroid nodule   . Vertigo   . Vitamin B 12 deficiency     Patient Active Problem List   Diagnosis Date Noted  . 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 Dysphagia following cerebrovascular accident 02/17/2020  . Dysarthria due to recent cerebral infarction  02/17/2020  . Obesity 02/16/2020  . Hyperbilirubinemia 02/16/2020  . CVA (cerebral vascular accident) ([place]) 02/15/2020  . Thyroid mass 02/15/2020  . Anxiety and depression 02/15/2020  . Pulmonary emphysema ([place]) 06/02/2019  . Aortic atherosclerosis ([place]) 06/02/2019  . Smokers' cough ([place]) 06/02/2019  . 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  . Multinodular goiter 12/24/2018  . Subclinical hyperthyroidism 12/24/2018  . BPH (benign prostatic hyperplasia) 11/20/2018  . Pulmonary nodules 11/15/2018  . Esophageal thickening 11/15/2018  . Cigarette nicotine dependence without complication 97/98/9211  . Old MI (myocardial infarction) 01/30/2017  . CAD (coronary artery disease) 01/30/2017  . Hyperlipidemia 10/10/2016  . History of non-ST elevation myocardial infarction (NSTEMI) 10/09/2016  . Tobacco abuse 08/28/2016  . Difficulty sleeping 04/22/2016  . Benign prostatic hyperplasia with urinary frequency 02/11/2016    Past Surgical History:  Procedure Laterality Date  . COLONOSCOPY    . CORONARY ANGIOPLASTY  10/09/2016  . CORONARY STENT INTERVENTION N/A 10/09/2016   Procedure: CORONARY STENT INTERVENTION;  Surgeon: Nelva Bush, MD;  Location: [facility];  Service: Cardiovascular;  Laterality: N/A;  . CYSTOSCOPY WITH RETROGRADE PYELOGRAM, URETEROSCOPY AND STENT PLACEMENT Left 06/17/2019   Procedure: CYSTOSCOPY WITH RETROGRADE PYELOGRAM, URETEROSCOPY AND STENT PLACEMENT;  Surgeon: Robley Fries, MD;  Location: [facility];  Service: Urology;  Laterality: Left;  1 HR  . HOLMIUM LASER APPLICATION Left 09/13/1738   Procedure: HOLMIUM LASER APPLICATION;  Surgeon:  Robley Fries, MD;  Location: [facility];  Service: Urology;  Laterality: Left;  . INTRAVASCULAR ULTRASOUND/IVUS N/A 10/09/2016   Procedure: Intravascular Ultrasound/IVUS;  Surgeon: Nelva Bush, MD;  Location: [facility];  Service: Cardiovascular;  Laterality: N/A;  . IR CHOLANGIOGRAM EXISTING TUBE  11/18/2018  . IR  PERC CHOLECYSTOSTOMY  11/16/2018  . LEFT HEART CATH AND CORONARY ANGIOGRAPHY N/A 10/09/2016   Procedure: LEFT HEART CATH AND CORONARY ANGIOGRAPHY;  Surgeon: Nelva Bush, MD;  Location: [facility];  Service: Cardiovascular;  Laterality: N/A;  . LOOP RECORDER INSERTION N/A 02/20/2020   Procedure: LOOP RECORDER INSERTION;  Surgeon: Vickie Epley, MD;  Location: [facility];  Service: Cardiovascular;  Laterality: N/A;       Family History  Problem Relation Age of Onset  . Alcohol abuse Father        called [facility] per pt  . Cancer Father        OAK CELL CANCER PER PT NON SURGICAL   . Colon cancer Neg Hx   . Colon polyps Neg Hx   . Esophageal cancer Neg Hx   . Rectal cancer Neg Hx   . Stomach cancer Neg Hx     Social History   Tobacco Use  . Smoking status: Current Every Day Smoker    Packs/day: 2.00    Years: 45.00    Pack years: 90.00    Types: Cigarettes    Start date: 01/09/1961  . Smokeless tobacco: Never Used  . Tobacco comment: tobacco info given  Vaping Use  . Vaping Use: Never used  Substance Use Topics  . Alcohol use: No  . Drug use: No    Home Medications Prior to Admission medications   Medication Sig Start Date End Date Taking? Authorizing Provider  acetaminophen (TYLENOL) 325 MG tablet Take 2 tablets (650 mg total) by mouth every 4 (four) hours as needed for mild pain (or temp > 37.5 C (99.5 F)). 03/08/20   Angiulli, Lavon Paganini, PA-C  albuterol (VENTOLIN HFA) 108 (90 Base) MCG/ACT inhaler INHALE 2 PUFFS INTO THE LUNGS EVERY SIX HOURS AS NEEDED. 03/08/20 03/08/21  Angiulli, Lavon Paganini, PA-C  apixaban (ELIQUIS) 5 MG TABS tablet Take 1 tablet (5 mg total) by mouth 2 (two) times daily. 04/23/20   Evans Lance, MD  aspirin EC 81 MG tablet Take 1 tablet (81 mg total) by mouth daily. Swallow whole. 04/27/20   Vickie Epley, MD  atorvastatin (LIPITOR) 80 MG tablet Take 1 tablet (80 mg total) by mouth daily. 03/08/20   Angiulli, Lavon Paganini, PA-C   Baclofen 5 MG TABS Take 5 mg by mouth 4 (four) times daily. 03/31/20   Lovorn, Jinny Blossom, MD  citalopram (CELEXA) 10 MG tablet Take 1 tablet (10 mg total) by mouth daily. 03/31/20   Lovorn, Jinny Blossom, MD  cyclobenzaprine (FLEXERIL) 5 MG tablet Take 5 mg by mouth 3 (three) times daily as needed for muscle spasms.    [provider]  diclofenac Sodium (VOLTAREN) 1 % GEL Apply 4 g topically 4 (four) times daily as needed. 03/08/20   Lovorn, Jinny Blossom, MD  dicyclomine (BENTYL) 20 MG tablet Take 1 tablet (20 mg total) by mouth 2 (two) times daily. 04/05/20   Marcello Fennel, PA-C  lidocaine (LIDODERM) 5 % PLACE 1 PATCH ONTO THE SKIN DAILY. REMOVE & DISCARD PATCH WITHIN 12 HOURS OR AS DIRECTED BY MD Patient taking differently: 1 patch daily. Remove & discard patch within 12 hours  or as directed by MD 03/08/20 03/08/21  Angiulli, Lavon Paganini, PA-C  magnesium oxide (MAG-OX) 400 (241.3 Mg) MG tablet Take 1 tablet (400 mg total) by mouth daily. 03/08/20   Angiulli, Lavon Paganini, PA-C  melatonin 3 MG TABS tablet Take 1 tablet (3 mg total) by mouth at bedtime. 03/08/20   Angiulli, Lavon Paganini, PA-C  methimazole (TAPAZOLE) 5 MG tablet TAKE 1 TABLET(5 MG) BY MOUTH DAILY 03/08/20   Angiulli, Lavon Paganini, PA-C  metoprolol succinate (TOPROL XL) 25 MG 24 hr tablet Take 1 tablet (25 mg total) by mouth daily. 04/23/20   Evans Lance, MD  nicotine (NICODERM CQ - DOSED IN MG/24 HOURS) 21 mg/24hr patch 21 mg patch daily x2 weeks then 14 mg patch daily x3 weeks then 7 mg patch daily x3 weeks and stop 03/08/20   Angiulli, Lavon Paganini, PA-C  pantoprazole (PROTONIX) 40 MG tablet Take 1 tablet (40 mg total) by mouth daily. 03/08/20   Angiulli, Lavon Paganini, PA-C  senna-docusate (SENOKOT-S) 8.6-50 MG tablet Take 1 tablet by mouth 2 (two) times daily. 03/08/20   Angiulli, Lavon Paganini, PA-C  tamsulosin (FLOMAX) 0.4 MG CAPS capsule Take 2 capsules (0.8 mg total) by mouth daily after supper. 03/08/20   Angiulli, Lavon Paganini, PA-C  traMADol (ULTRAM) 50 MG tablet Take  1 tablet (50 mg total) by mouth every 12 (twelve) hours as needed for severe pain. 04/26/20   Frann Rider, NP    Allergies    Patient has no known allergies.  Review of Systems   Review of Systems  Constitutional: Positive for activity change, appetite change and fatigue. Negative for chills and fever.  HENT: Positive for congestion and sore throat. Negative for trouble swallowing and voice change.   Eyes: Negative.  Negative for photophobia, pain, redness, itching and visual disturbance.  Respiratory: Positive for cough, chest tightness and shortness of breath.   Cardiovascular: Negative.   Gastrointestinal: Negative.   Musculoskeletal: Positive for arthralgias, back pain and myalgias. Negative for neck pain and neck stiffness.  Skin: Negative.   Neurological: Positive for weakness, light-headedness and headaches. Negative for dizziness, tremors, syncope, facial asymmetry and speech difficulty.    Physical Exam Updated Vital Signs BP (!) 141/75   Pulse 63   Temp 98 F (36.7 C) (Oral)   Resp (!) 22   Ht 6\' 1"  (1.854 m)   Wt 99.8 kg   SpO2 93%   BMI 29.03 kg/m   Physical Exam Vitals and nursing note reviewed.  Constitutional:      Appearance: He is obese. He is not toxic-appearing.  HENT:     Head: Normocephalic and atraumatic.     Nose: Nose normal.     Mouth/Throat:     Mouth: Mucous membranes are moist.     Pharynx: Oropharynx is clear. Uvula midline. No oropharyngeal exudate, posterior oropharyngeal erythema or uvula swelling.     Tonsils: No tonsillar exudate.     Comments: No sublingual or submental tenderness to palpation, no sign of oropharyngeal abscess. Eyes:     General: Lids are normal. Vision grossly intact.        Right eye: No discharge.        Left eye: No discharge.     Extraocular Movements: Extraocular movements intact.     Conjunctiva/sclera: Conjunctivae normal.     Pupils: Pupils are equal, round, and reactive to light.  Neck:     Trachea:  Trachea and phonation normal.  Cardiovascular:     Rate and  Rhythm: Normal rate and regular rhythm.     Pulses: Normal pulses.     Heart sounds: Normal heart sounds. No murmur heard.   Pulmonary:     Effort: Pulmonary effort is normal. Tachypnea present. No bradypnea, accessory muscle usage or respiratory distress.     Breath sounds: Examination of the right-lower field reveals decreased breath sounds. Decreased breath sounds present. No wheezing or rales.  Chest:     Chest wall: No mass, lacerations, deformity, swelling, tenderness, crepitus or edema.  Abdominal:     General: Bowel sounds are normal. There is no distension.     Palpations: Abdomen is soft.     Tenderness: There is no abdominal tenderness. There is no right CVA tenderness, left CVA tenderness, guarding or rebound.  Musculoskeletal:        General: No deformity.     Cervical back: Normal range of motion and neck supple. No edema, rigidity or crepitus. No pain with movement, spinous process tenderness or muscular tenderness.     Right lower leg: No edema.     Left lower leg: No edema.     Comments: Moving all 4 extremities spontaneously without difficulty.  Symmetric strength and sensation in upper and lower extremities bilaterally.  Lymphadenopathy:     Cervical: No cervical adenopathy.  Skin:    General: Skin is warm and dry.     Capillary Refill: Capillary refill takes less than 2 seconds.     Findings: No rash.  Neurological:     Mental Status: He is alert. Mental status is at baseline.     Sensory: Sensation is intact.     Motor: Motor function is intact.     Gait: Gait is intact.  Psychiatric:        Mood and Affect: Mood normal.     ED Results / Procedures / Treatments   Labs (all labs ordered are listed, but only abnormal results are displayed) Labs Reviewed  CBC WITH DIFFERENTIAL/PLATELET - Abnormal; Notable for the following components:      Result Value   Platelets 137 (*)    All other components  within normal limits  RESP PANEL BY RT-PCR (FLU A&B, COVID) ARPGX2  BASIC METABOLIC PANEL    EKG EKG: sinus rhythm, no STEMI.   Radiology DG Chest Portable 1 View  Result Date: 06/16/2020 CLINICAL DATA:  Cough, nasal congestion and body aches. EXAM: PORTABLE CHEST 1 VIEW COMPARISON:  February 23, 2020 FINDINGS: There is no evidence of acute infiltrate, pleural effusion or pneumothorax. The heart size and mediastinal contours are within normal limits. A radiopaque loop recorder device is seen. Moderate severity calcification of the aortic arch is noted. The visualized skeletal structures are unremarkable. IMPRESSION: No active cardiopulmonary disease. Electronically Signed   By: Virgina Norfolk M.D.   On: 06/16/2020 17:34    Procedures Procedures :1}  Medications Ordered in ED Medications - No data to display  ED Course  I have reviewed the triage vital signs and the nursing notes.  Pertinent labs & imaging results that were available during my care of the patient were reviewed by me and considered in my medical decision making (see chart for details).    MDM Rules/Calculators/A&P                         70 year old male presents with concern for body aches, congestion, sore throat, and headache for 24 hours.  He is vaccinated for COVID-19.  Differential diagnosis  includes is not limited to ACS, pneumonia, PE, pleural effusion, COVID-19 or other acute viral illness.  Tachypneic on intake, vital signs otherwise normal.  Cardiopulmonary exam is significant only for diminished breath sounds the right lung base, abdominal exam is benign.  Patient is neurovascular intact all 4 extremities there is no sign of oropharyngeal abscess on HEENT exam.  CBC unremarkable, BMP unremarkable, respiratory pathogen panel pending.  Chest x-ray negative for acute cardiopulmonary disease.   No further work-up warranted in the time given reassuring physical exam, vital signs, laboratory studies and  EKG/chest x-ray.  Patient may follow-up on his COVID-19 test results in the outpatient setting through the [facility] app.  Additionally recommend close follow-up with his primary care doctor.  He was ambulated in the department with maintenance of his oxygen saturation greater than 91% on room air without significant tachypnea or tachycardia, and without becoming symptomatic with lightheadedness or nausea.   Arkeem voiced understanding of his medical evaluation and treatment plan.  Each of his questions was answered to his expressed satisfaction.  Return precautions given.  Patient is well-appearing, stable, and appropriate for discharge.  This chart was dictated using voice recognition software, Dragon. Despite the best efforts of this provider to proofread and correct errors, errors may still occur which can change documentation meaning.    Final Clinical Impression(s) / ED Diagnoses Final diagnoses:  None    Rx / DC Orders ED Discharge Orders    None       Emeline Darling, PA-C 06/16/20 1842    Dorie Rank, MD 06/17/20 [id number]

## 2020-06-16 NOTE — ED Triage Notes (Signed)
Cough, nasal congestion, body aches since yesterday. Pt vaccinated for covid. Hx of copd.

## 2020-06-21 DIAGNOSIS — D696 Thrombocytopenia, unspecified: Secondary | ICD-10-CM | POA: Diagnosis not present

## 2020-06-21 DIAGNOSIS — E0590 Thyrotoxicosis, unspecified without thyrotoxic crisis or storm: Secondary | ICD-10-CM | POA: Diagnosis not present

## 2020-06-21 DIAGNOSIS — E0520 Thyrotoxicosis with toxic multinodular goiter without thyrotoxic crisis or storm: Secondary | ICD-10-CM | POA: Diagnosis not present

## 2020-06-21 DIAGNOSIS — M549 Dorsalgia, unspecified: Secondary | ICD-10-CM | POA: Diagnosis not present

## 2020-06-28 NOTE — Progress Notes (Signed)
[facility] Summary Report / Loop Recorder 

## 2020-07-03 DIAGNOSIS — E0590 Thyrotoxicosis, unspecified without thyrotoxic crisis or storm: Secondary | ICD-10-CM | POA: Diagnosis not present

## 2020-07-03 DIAGNOSIS — M545 Low back pain, unspecified: Secondary | ICD-10-CM | POA: Diagnosis not present

## 2020-07-03 DIAGNOSIS — G4700 Insomnia, unspecified: Secondary | ICD-10-CM | POA: Diagnosis not present

## 2020-07-06 ENCOUNTER — Ambulatory Visit (INDEPENDENT_AMBULATORY_CARE_PROVIDER_SITE_OTHER): Payer: Medicare Other

## 2020-07-06 DIAGNOSIS — I639 Cerebral infarction, unspecified: Secondary | ICD-10-CM

## 2020-07-06 LAB — CUP PACEART REMOTE DEVICE CHECK
Date Time Interrogation Session: 20220627161707
Implantable Pulse Generator Implant Date: 20220211

## 2020-07-10 ENCOUNTER — Emergency Department (HOSPITAL_COMMUNITY): Payer: Medicare Other

## 2020-07-10 ENCOUNTER — Emergency Department (HOSPITAL_COMMUNITY)
Admission: EM | Admit: 2020-07-10 | Discharge: 2020-07-10 | Discharge status: Against Medical Advice | Payer: Medicare Other | Attending: Emergency Medicine | Admitting: Emergency Medicine

## 2020-07-10 ENCOUNTER — Other Ambulatory Visit: Payer: Self-pay

## 2020-07-10 DIAGNOSIS — J449 Chronic obstructive pulmonary disease, unspecified: Secondary | ICD-10-CM | POA: Insufficient documentation

## 2020-07-10 DIAGNOSIS — I639 Cerebral infarction, unspecified: Secondary | ICD-10-CM

## 2020-07-10 DIAGNOSIS — I10 Essential (primary) hypertension: Secondary | ICD-10-CM | POA: Diagnosis not present

## 2020-07-10 DIAGNOSIS — I2510 Atherosclerotic heart disease of native coronary artery without angina pectoris: Secondary | ICD-10-CM | POA: Insufficient documentation

## 2020-07-10 DIAGNOSIS — Z79899 Other long term (current) drug therapy: Secondary | ICD-10-CM | POA: Diagnosis not present

## 2020-07-10 DIAGNOSIS — R200 Anesthesia of skin: Secondary | ICD-10-CM | POA: Diagnosis not present

## 2020-07-10 DIAGNOSIS — R29818 Other symptoms and signs involving the nervous system: Secondary | ICD-10-CM | POA: Diagnosis not present

## 2020-07-10 DIAGNOSIS — Z7901 Long term (current) use of anticoagulants: Secondary | ICD-10-CM | POA: Insufficient documentation

## 2020-07-10 DIAGNOSIS — F17210 Nicotine dependence, cigarettes, uncomplicated: Secondary | ICD-10-CM | POA: Insufficient documentation

## 2020-07-10 DIAGNOSIS — R29810 Facial weakness: Secondary | ICD-10-CM | POA: Diagnosis not present

## 2020-07-10 DIAGNOSIS — R4781 Slurred speech: Secondary | ICD-10-CM | POA: Diagnosis not present

## 2020-07-10 DIAGNOSIS — I959 Hypotension, unspecified: Secondary | ICD-10-CM | POA: Diagnosis not present

## 2020-07-10 DIAGNOSIS — I6523 Occlusion and stenosis of bilateral carotid arteries: Secondary | ICD-10-CM | POA: Diagnosis not present

## 2020-07-10 DIAGNOSIS — I679 Cerebrovascular disease, unspecified: Secondary | ICD-10-CM | POA: Diagnosis not present

## 2020-07-10 DIAGNOSIS — Z7982 Long term (current) use of aspirin: Secondary | ICD-10-CM | POA: Diagnosis not present

## 2020-07-10 DIAGNOSIS — N189 Chronic kidney disease, unspecified: Secondary | ICD-10-CM | POA: Diagnosis not present

## 2020-07-10 DIAGNOSIS — I4891 Unspecified atrial fibrillation: Secondary | ICD-10-CM | POA: Diagnosis not present

## 2020-07-10 DIAGNOSIS — I6501 Occlusion and stenosis of right vertebral artery: Secondary | ICD-10-CM | POA: Diagnosis not present

## 2020-07-10 DIAGNOSIS — R531 Weakness: Secondary | ICD-10-CM | POA: Diagnosis not present

## 2020-07-10 LAB — COMPREHENSIVE METABOLIC PANEL
ALT: 19 U/L (ref 0–44)
AST: 19 U/L (ref 15–41)
Albumin: 3.6 g/dL (ref 3.5–5.0)
Alkaline Phosphatase: 126 U/L (ref 38–126)
Anion gap: 9 (ref 5–15)
BUN: 11 mg/dL (ref 8–23)
CO2: 26 mmol/L (ref 22–32)
Calcium: 9.2 mg/dL (ref 8.9–10.3)
Chloride: 103 mmol/L (ref 98–111)
Creatinine, Ser: 0.98 mg/dL (ref 0.61–1.24)
GFR, Estimated: >60 mL/min (ref >60)
Glucose, Bld: 118 mg/dL — ABNORMAL HIGH (ref 70–99)
Potassium: 3.5 mmol/L (ref 3.5–5.1)
Sodium: 138 mmol/L (ref 135–145)
Total Bilirubin: 1 mg/dL (ref 0.3–1.2)
Total Protein: 6.6 g/dL (ref 6.5–8.1)

## 2020-07-10 LAB — DIFFERENTIAL
Abs Immature Granulocytes: 0.06 10*3/uL (ref 0.00–0.07)
Basophils Absolute: 0.1 10*3/uL (ref 0.0–0.1)
Basophils Relative: 1 %
Eosinophils Absolute: 0.3 10*3/uL (ref 0.0–0.5)
Eosinophils Relative: 3 %
Immature Granulocytes: 1 %
Lymphocytes Relative: 23 %
Lymphs Abs: 2.3 10*3/uL (ref 0.7–4.0)
Monocytes Absolute: 0.6 10*3/uL (ref 0.1–1.0)
Monocytes Relative: 6 %
Neutro Abs: 6.8 10*3/uL (ref 1.7–7.7)
Neutrophils Relative %: 66 %

## 2020-07-10 LAB — CBC
HCT: 51.7 % (ref 39.0–52.0)
Hemoglobin: 17 g/dL (ref 13.0–17.0)
MCH: 28.8 pg (ref 26.0–34.0)
MCHC: 32.9 g/dL (ref 30.0–36.0)
MCV: 87.5 fL (ref 80.0–100.0)
Platelets: 141 10*3/uL — ABNORMAL LOW (ref 150–400)
RBC: 5.91 MIL/uL — ABNORMAL HIGH (ref 4.22–5.81)
RDW: 13.4 % (ref 11.5–15.5)
WBC: 10.2 10*3/uL (ref 4.0–10.5)
nRBC: 0 % (ref 0.0–0.2)

## 2020-07-10 LAB — I-STAT CHEM 8, ED
BUN: 12 mg/dL (ref 8–23)
Calcium, Ion: 1.12 mmol/L — ABNORMAL LOW (ref 1.15–1.40)
Chloride: 102 mmol/L (ref 98–111)
Creatinine, Ser: 0.9 mg/dL (ref 0.61–1.24)
Glucose, Bld: 116 mg/dL — ABNORMAL HIGH (ref 70–99)
HCT: 50 % (ref 39.0–52.0)
Hemoglobin: 17 g/dL (ref 13.0–17.0)
Potassium: 3.5 mmol/L (ref 3.5–5.1)
Sodium: 139 mmol/L (ref 135–145)
TCO2: 28 mmol/L (ref 22–32)

## 2020-07-10 LAB — URINALYSIS, ROUTINE W REFLEX MICROSCOPIC
Bilirubin Urine: NEGATIVE
Glucose, UA: NEGATIVE mg/dL
Hgb urine dipstick: NEGATIVE
Ketones, ur: NEGATIVE mg/dL
Leukocytes,Ua: NEGATIVE
Nitrite: NEGATIVE
Protein, ur: NEGATIVE mg/dL
Specific Gravity, Urine: 1.015 (ref 1.005–1.030)
pH: 5 (ref 5.0–8.0)

## 2020-07-10 LAB — CBG MONITORING, ED: Glucose-Capillary: 118 mg/dL — ABNORMAL HIGH (ref 70–99)

## 2020-07-10 LAB — PROTIME-INR
INR: 1 (ref 0.8–1.2)
Prothrombin Time: 12.9 seconds (ref 11.4–15.2)

## 2020-07-10 LAB — APTT: aPTT: 26 seconds (ref 24–36)

## 2020-07-10 MED ORDER — IOHEXOL 350 MG/ML SOLN
100.0000 mL | Freq: Once | INTRAVENOUS | Status: AC | PRN
  Administered 2020-07-10: 100 mL via INTRAVENOUS

## 2020-07-10 MED ORDER — SODIUM CHLORIDE 0.9% FLUSH
3.0000 mL | Freq: Once | INTRAVENOUS | Status: AC
  Administered 2020-07-10: 3 mL via INTRAVENOUS

## 2020-07-10 NOTE — ED Notes (Signed)
Rapid response present. H/o CVA, was essentially "pretty much back to normal". CBG 119. To CT3.

## 2020-07-10 NOTE — ED Notes (Addendum)
Arrival at bridge, LSN last night 1930, arrives to full stroke team, alert, NAD, calm, interactive, speech more slurred than usual, L sided weakness, upper and lower 4/5. EDP and neuro present. Labs drawn. Not a tPA candidate, decision made per neuro. Takes eliquis for afib. Seen by FD at scene who is familiar with pt, and noticed changes in speech.

## 2020-07-10 NOTE — ED Notes (Signed)
Arrive to CT3.

## 2020-07-10 NOTE — ED Notes (Signed)
To room Trauma C from CT, no changes, alert, NAD, calm, interactive.

## 2020-07-10 NOTE — Consult Note (Signed)
Neurology Consultation  Reason for Consult: Dysarthria, left hand and arm numbness Referring Physician: Dr. Rogene Houston  CC: Dysarthria, left hand and arm numbness  History is obtained from: Patient, EMS, Chart review  HPI: David Hendricks is a 70 y.o. male with a medical history significant for hyperlipidemia, coronary artery disease, myocardial infarction in 2018 s/p PCI, tobacco use, atrial fibrillation on Eliquis, chronic kidney disease, and vitamin B12 deficiency who presented to the ED today for evaluation of acute onset of left hand numbness that expanded to involve his left arm and dysarthria that is worse than his baseline subtle slurring. He states that when he went to bed last night at 22:00 he was at his baseline and when he woke up this morning, he first noticed the left hand numbness. As the numbness expanded up his arm and with dysarthria worse than baseline, he activated EMS. On EMS arrival, they noted a blood pressure of 280/140 and with his left lower extremity worse than left upper extremity weakness, they activated a code stroke. Patient states that he did not take his Eliquis this morning due to feeling like he was having a stroke and denies taking the medication last night because "I take it in the mornings". He also endorses increased stress yesterday with his dog running off and feeling dehydrated.   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 He had a loop recorder evaluation at discharge and was found to have atrial fibrillation with rapid ventricular response and was placed on Eliquis in April of 2022. He had some left arm weakness, left facial weakness, dysarthria, and dysphagia on discharge and neurology evaluation in April of 2022 but patient endorses that his deficits had largely resolved since his stroke in February 2022. He states that he  only has mild left-sided weakness with subtle dysphagia residual deficits.   LKW: 07/09/2020 22:00 tpa given?: no, patient is on Eliquis for atrial fibrillation IR Thrombectomy? No, presentation is not consistent with LVO, no LVO identified on cerebral vessel imaging Modified Rankin Scale: 1-No significant post stroke disability and can perform usual duties with stroke symptoms  ROS: A complete ROS was performed and is negative except as noted in the HPI.    Past Medical History:  Diagnosis Date   Allergy    Anxiety    Aortic atherosclerosis (HCC)    Arthritis    Benign prostatic hyperplasia (BPH) with urinary urgency    Chronic kidney disease    KIDNEY STONES   COPD (chronic obstructive pulmonary disease) (HCC)    possible    COPD (chronic obstructive pulmonary disease) ([place])    Coronary artery disease    10/18 PCI/DES to pLAD, mild nonobstructive disease in the Lcx/RCA. Normal EF.    Depression    Dyslipidemia    Dysrhythmia 2016   irregular heartbeat   Gallstones    GERD (gastroesophageal reflux disease)    History of kidney stones    Hyperlipidemia    Hyperthyroidism    Leg cramps    Multiple pulmonary nodules    Myocardial infarction ([place])    2018   Pneumonia    Seasonal allergies    Stroke [facility])    Thyroid nodule    Vertigo    Vitamin B 12 deficiency    Past Surgical History:  Procedure Laterality Date   COLONOSCOPY     CORONARY ANGIOPLASTY  10/09/2016   CORONARY STENT INTERVENTION N/A 10/09/2016   Procedure: CORONARY STENT INTERVENTION;  Surgeon: Nelva Bush, MD;  Location: [place] [facility];  Service: Cardiovascular;  Laterality: N/A;   CYSTOSCOPY WITH RETROGRADE PYELOGRAM, URETEROSCOPY AND STENT PLACEMENT Left 06/17/2019   Procedure: CYSTOSCOPY WITH RETROGRADE PYELOGRAM, URETEROSCOPY AND STENT PLACEMENT;  Surgeon: Robley Fries, MD;  Location: [facility];  Service: Urology;  Laterality: Left;  1 HR   HOLMIUM LASER APPLICATION Left 02/12/5571   Procedure:  HOLMIUM LASER APPLICATION;  Surgeon: Robley Fries, MD;  Location: [facility];  Service: Urology;  Laterality: Left;   INTRAVASCULAR ULTRASOUND/IVUS N/A 10/09/2016   Procedure: Intravascular Ultrasound/IVUS;  Surgeon: Nelva Bush, MD;  Location: [place] [facility];  Service: Cardiovascular;  Laterality: N/A;   IR CHOLANGIOGRAM EXISTING TUBE  11/18/2018   IR PERC CHOLECYSTOSTOMY  11/16/2018   LEFT HEART CATH AND CORONARY ANGIOGRAPHY N/A 10/09/2016   Procedure: LEFT HEART CATH AND CORONARY ANGIOGRAPHY;  Surgeon: Nelva Bush, MD;  Location: [place] [facility];  Service: Cardiovascular;  Laterality: N/A;   LOOP RECORDER INSERTION N/A 02/20/2020   Procedure: LOOP RECORDER INSERTION;  Surgeon: Vickie Epley, MD;  Location: [place] [facility];  Service: Cardiovascular;  Laterality: N/A;   Family History  Problem Relation Age of Onset   Alcohol abuse Father        called [facility] per pt   Cancer Father        [facility] PER PT NON SURGICAL    Colon cancer Neg Hx    Colon polyps Neg Hx    Esophageal cancer Neg Hx    Rectal cancer Neg Hx    Stomach cancer Neg Hx    Social History:   reports that he has been smoking cigarettes. He started smoking about 59 years ago. He has a 90.00 pack-year smoking history. He has never used smokeless tobacco. He reports that he does not drink alcohol and does not use drugs.  Medications  Current Facility-Administered Medications:    cyanocobalamin ((VITAMIN B-12)) injection 1,000 mcg, 1,000 mcg, Intramuscular, Q30 days, Horald Pollen, MD, 1,000 mcg at 08/29/19 1525   sodium chloride flush (NS) 0.9 % injection 3 mL, 3 mL, Intravenous, Once, Fredia Sorrow, MD  Current Outpatient Medications:    acetaminophen (TYLENOL) 325 MG tablet, Take 2 tablets (650 mg total) by mouth every 4 (four) hours as needed for mild pain (or temp > 37.5 C (99.5 F))., Disp: , Rfl:    albuterol (VENTOLIN HFA) 108 (90 Base) MCG/ACT inhaler, INHALE 2 PUFFS  INTO THE LUNGS EVERY SIX HOURS AS NEEDED., Disp: 8.5 g, Rfl: 0   apixaban (ELIQUIS) 5 MG TABS tablet, Take 1 tablet (5 mg total) by mouth 2 (two) times daily., Disp: 60 tablet, Rfl: 3   aspirin EC 81 MG tablet, Take 1 tablet (81 mg total) by mouth daily. Swallow whole., Disp: 90 tablet, Rfl: 3   atorvastatin (LIPITOR) 80 MG tablet, Take 1 tablet (80 mg total) by mouth daily., Disp: 30 tablet, Rfl: 3   Baclofen 5 MG TABS, Take 5 mg by mouth 4 (four) times daily., Disp: 120 tablet, Rfl: 5   citalopram (CELEXA) 10 MG tablet, Take 1 tablet (10 mg total) by mouth daily., Disp: 30 tablet, Rfl: 5   cyclobenzaprine (FLEXERIL) 5 MG tablet, Take 5 mg by mouth 3 (three) times daily as needed for muscle spasms., Disp: , Rfl:    diclofenac Sodium (VOLTAREN) 1 % GEL, Apply 4  g topically 4 (four) times daily as needed., Disp: 200 g, Rfl: 0   dicyclomine (BENTYL) 20 MG tablet, Take 1 tablet (20 mg total) by mouth 2 (two) times daily., Disp: 20 tablet, Rfl: 0   lidocaine (LIDODERM) 5 %, PLACE 1 PATCH ONTO THE SKIN DAILY. REMOVE & DISCARD PATCH WITHIN 12 HOURS OR AS DIRECTED BY MD (Patient taking differently: 1 patch daily. Remove & discard patch within 12 hours or as directed by MD), Disp: 30 patch, Rfl: 0   magnesium oxide (MAG-OX) 400 (241.3 Mg) MG tablet, Take 1 tablet (400 mg total) by mouth daily., Disp: 30 tablet, Rfl: 0   melatonin 3 MG TABS tablet, Take 1 tablet (3 mg total) by mouth at bedtime., Disp: 30 tablet, Rfl: 0   methimazole (TAPAZOLE) 5 MG tablet, TAKE 1 TABLET(5 MG) BY MOUTH DAILY, Disp: 30 tablet, Rfl: 1   metoprolol succinate (TOPROL XL) 25 MG 24 hr tablet, Take 1 tablet (25 mg total) by mouth daily., Disp: 30 tablet, Rfl: 3   nicotine (NICODERM CQ - DOSED IN MG/24 HOURS) 21 mg/24hr patch, 21 mg patch daily x2 weeks then 14 mg patch daily x3 weeks then 7 mg patch daily x3 weeks and stop, Disp: 28 patch, Rfl: 0   pantoprazole (PROTONIX) 40 MG tablet, Take 1 tablet (40 mg total) by mouth daily.,  Disp: 30 tablet, Rfl: 0   senna-docusate (SENOKOT-S) 8.6-50 MG tablet, Take 1 tablet by mouth 2 (two) times daily., Disp: , Rfl:    tamsulosin (FLOMAX) 0.4 MG CAPS capsule, Take 2 capsules (0.8 mg total) by mouth daily after supper., Disp: 30 capsule, Rfl: 0   traMADol (ULTRAM) 50 MG tablet, Take 1 tablet (50 mg total) by mouth every 12 (twelve) hours as needed for severe pain., Disp: 30 tablet, Rfl: 0  Exam: Current vital signs: BP 128/61   Pulse 62   Temp 98.2 F (36.8 C) (Oral)   Resp 15   SpO2 92%  Vital signs in last 24 hours:   GENERAL: Awake, alert, in no acute distress Psych: Affect appropriate for situation, calm and cooperative with examination Head: Normocephalic and atraumatic, without obvious abnormality EENT: Wears eyeglasses at baseline, no OP obstruction, normal conjunctivae, edentulous.  LUNGS: Normal respiratory effort. Non-labored breathing. SpO2 94% on room air during examination CV: Regular rate on cardiac telemetry. No pedal edema ABDOMEN - Soft, non-tender, non-distended Ext: warm, well perfused, without obvious deformity  NEURO:  Mental Status: Awake, alert, and oriented to person, place, age, month, and situation. He is able to provide a clear and coherent history of present illness.  Speech/Language: speech is dysarthric without aphasia. Naming, repetition, fluency, and comprehension intact.  No neglect is noted.  Cranial Nerves:  II: PERRL 2 mm/brisk. Visual fields full.  III, IV, VI: EOMI without ptosis V: Sensation is intact to light touch and symmetrical to face.  VII: Face is symmetric resting and smiling.  VIII: Hearing is intact to voice IX, X: Palate elevation is symmetric. Phonation normal.  XI: Normal sternocleidomastoid and trapezius muscle strength XII: Tongue protrudes midline without fasciculations.   Motor: 5/5 strength present on the right upper and lower extremities.  Left upper and lower extremities with some mild weakness noted 4+/5  strength.  No vertical drift present on the right upper or lower extremities, left upper extremity also without pronator drift. There was a subtle drift noted on the left lower extremity during assessment.  Tone is normal. Bulk is normal.  Sensation: Intact to light  touch bilaterally in all four extremities.  Coordination: FTN intact bilaterally. HKS intact bilaterally.  DTRs: 2+ and symmetric patellae and biceps Gait: Deferred  NIHSS: 1a Level of Conscious.: 0 1b LOC Questions: 0 1c LOC Commands: 0 2 Best Gaze: 0 3 Visual: 0 4 Facial Palsy: 0 5a Motor Arm - left: 0 5b Motor Arm - Right: 0 6a Motor Leg - Left: 1 6b Motor Leg - Right: 0 7 Limb Ataxia: 0 8 Sensory: 0 9 Best Language: 0 10 Dysarthria: 1 11 Extinct. and Inatten.: 0 TOTAL: 2  Labs I have reviewed labs in epic and the results pertinent to this consultation are: CBC    Component Value Date/Time   WBC 10.2 07/10/2020 0922   RBC 5.91 (H) 07/10/2020 0922   HGB 17.0 07/10/2020 0930   HCT 50.0 07/10/2020 0930   PLT 141 (L) 07/10/2020 0922   MCV 87.5 07/10/2020 0922   MCH 28.8 07/10/2020 0922   MCHC 32.9 07/10/2020 0922   RDW 13.4 07/10/2020 0922   LYMPHSABS 2.3 07/10/2020 0922   MONOABS 0.6 07/10/2020 0922   EOSABS 0.3 07/10/2020 0922   BASOSABS 0.1 07/10/2020 0922   CMP     Component Value Date/Time   NA 139 07/10/2020 0930   K 3.5 07/10/2020 0930   CL 102 07/10/2020 0930   CO2 27 06/16/2020 1730   GLUCOSE 116 (H) 07/10/2020 0930   BUN 12 07/10/2020 0930   CREATININE 0.90 07/10/2020 0930   CALCIUM 8.7 (L) 06/16/2020 1730   PROT 7.2 04/05/2020 1220   PROT 6.3 12/04/2016 1029   ALBUMIN 4.1 04/05/2020 1220   ALBUMIN 4.0 12/04/2016 1029   AST 18 04/05/2020 1220   ALT 22 04/05/2020 1220   ALKPHOS 118 04/05/2020 1220   BILITOT 1.1 04/05/2020 1220   BILITOT 0.6 12/04/2016 1029   GFRNONAA >60 06/16/2020 1730   GFRAA 51 (L) 06/11/2019 2325   Lipid Panel     Component Value Date/Time   CHOL 168  02/16/2020 0500   CHOL 106 12/04/2016 1029   TRIG 80 02/16/2020 0500   HDL 41 02/16/2020 0500   HDL 45 12/04/2016 1029   CHOLHDL 4.1 02/16/2020 0500   VLDL 16 02/16/2020 0500   LDLCALC 111 (H) 02/16/2020 0500   LDLCALC 38 12/04/2016 1029   Lab Results  Component Value Date   HGBA1C 5.2 02/16/2020   Imaging I have reviewed the images obtained:  CT-scan of the brain 07/10/2020: 1. Expected appearance of the February infarct in the posterior right frontal lobe white matter. 2. No acute cortically based infarct or acute intracranial hemorrhage identified. ASPECTS 10. 3. Underlying chronic small vessel disease.  CT angio head and neck / CT cerebral perfusion 07/10/2020: 1. Negative for large vessel occlusion, and no core infarct or ischemic penumbra detected by CT Perfusion.   2. Stable CTA Head and Neck since February.  Minimal extracranial atherosclerosis.  Posterior circulation atherosclerosis, although with only mild stenosis of the distal left vertebral and basilar. Dominant right vertebral artery is diminutive and functionally terminates at the skull base.   3. 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 Aortic Atherosclerosis (ICD10-I70.0) and Emphysema (ICD10-J43.9).  MRI examination of the brain pending  Assessment: 70 y.o. male with PMHx of HLD, CAD, MI s/p PCI, tobacco use, AF on Eliquis, CKD, CVA with minimal residual dysarthria and left-sided weakness, and vitamin B12 deficiency who presented as a Code Stroke 07/10/2020 for evaluation of left hand and arm numbness and worsening dysarthria with  EMS reported hypertension with an initial SBP of 280.  - Examination revealed patient with minimal left upper and lower extremity weakness and dysarthria more pronounced than patient's reported baseline dysarthria. Initial NIHSS of 2.  - Patient endorses taking his Eliquis once daily though it is prescribed as BID dosing. Last Eliquis 07/09/2020 AM dose.  - CTH obtained  without acute cortically based infarct or acute intracranial hemorrhage with an ASPECTS of 10. CT angio head and neck and CT cerebral perfusion complete without evidence of LVO, core infarct, or ischemic penumbra.  - Presentation is most consistent with recrudescence of previous stroke symptoms in the setting of hypertension and dehydration versus less likely acute infarct. Will obtain MRI brain without contrast for further stroke evaluation. Due to recent stroke and full stroke work up, will defer further stroke work up at this time for MRI completion. Will complete further work up if MRI without contrast identifies acute infarct.  - Patient recently optimized for further stroke prevention in April 2022 on Eliquis following AF identification. Patient remains on atorvastatin for hyperlipidemia and ASA 81 mg daily.   Impression: Recrudescence of previous stroke symptoms versus acute infarct History of CVA with minimal residual left-sided weakness and dysarthria  Atrial fibrillation on Eliquis  Recommendations: - MRI brain without contrast- will complete full stroke work up if acute stroke seen on MRI - Patient education on correct dosing of Eliquis as a BID medication for stroke prevention - Risk factor modification  Anibal Henderson, AGAC-NP [facility] Pager: [phone]  Attending addendum Patient seen and examined is an acute code stroke Briefly 70 year old man past history of hypertension, CAD, MI, prior right MCA territory stroke with minimal residual left-sided weakness, recently diagnosed atrial fibrillation on Eliquis which he says he only takes once a day-question compliance, CKD, CVA presents with left hand and arm numbness and worsening dysarthria upon waking up this morning. Last known well last night. Outside the window for tPA On examination NIH stroke scale of 2. Noncontrast head CT unremarkable for acute process. CTA head and neck negative for emergent LVO.  No  core infarct or ischemic penumbra on CT perfusion.  Minimal extracranial atherosclerosis.  Posterior circulation atherosclerosis although only with mild stenosis of the distal left vertebral and basilar artery. Suggested getting an MRI to ensure this is not a new stroke and might just be recrudescence of old symptoms.  The Imaging personally reviewed-MRI brain shows a small area of restricted diffusion adjacent to the prior stroke in the right periventricular area. I discussed these findings with the patient and recommended admission for full stroke work-up, but he is adamant that he has things to take care of at home and cannot remain admitted to the hospital. He understands to do stroke might get bigger and he might lose function of that side of the body or have another stroke with more disability and he remains adamant despite detailed conversations about his risk of stroke and embolic events which at certain times in certain patients can be fatal as well.  I have discussed this with Dr. Rogene Houston.  The patient will sign out Bernice and he will follow-up with his outpatient providers If he chooses to stay, stroke work-up should include frequent neurochecks, telemetry, lipid panel, A1c, 2D echo, physical therapy and Occupational Therapy along with speech therapy evaluations. Please call me with questions.  Plan discussed with the patient at bedside as well as Dr. Rogene Houston.  -- Amie Portland, MD Neurologist [facility] Pager:  [phone]  

## 2020-07-10 NOTE — ED Notes (Signed)
Pt to MRI, no changes. Alert, NAD, calm, interactive.

## 2020-07-10 NOTE — ED Provider Notes (Addendum)
[facility] EMERGENCY DEPARTMENT Provider Note   CSN: [id number] Arrival date & time: 07/10/20  4481  An emergency department physician performed an initial assessment on this suspected stroke patient at 0919.  History Chief Complaint  Patient presents with   Code Stroke    David Hendricks is a 70 y.o. male.  Patient brought in by EMS as code stroke.  Patient last normal 2200 last evening.  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  Patient recently started on Eliquis.  For atrial fibrillation.  Patient states that the more slurred speech increased left upper extremity weakness and some left lower extremity weakness upon awakening this morning.  Denies any headache.  EMS reported blood pressure as being 856 systolic.  Past medical history also significant for coronary artery disease status post stent in 2018 COPD.      Past Medical History:  Diagnosis Date   Allergy    Anxiety    Aortic atherosclerosis (HCC)    Arthritis    Benign prostatic hyperplasia (BPH) with urinary urgency    Chronic kidney disease    KIDNEY STONES   COPD (chronic obstructive pulmonary disease) (HCC)    possible    COPD (chronic obstructive pulmonary disease) ([place])    Coronary artery disease    10/18 PCI/DES to pLAD, mild nonobstructive disease in the Lcx/RCA. Normal EF.    Depression    Dyslipidemia    Dysrhythmia 2016   irregular heartbeat   Gallstones    GERD (gastroesophageal reflux disease)    History of kidney stones    Hyperlipidemia    Hyperthyroidism    Leg cramps    Multiple pulmonary nodules    Myocardial infarction ([place])    2018   Pneumonia    Seasonal allergies    Stroke [facility])    Thyroid nodule    Vertigo    Vitamin B 12 deficiency     Patient Active Problem List   Diagnosis Date Noted   Right middle cerebral artery stroke ([place]) 02/24/2020   Dysphagia following cerebrovascular accident 02/17/2020   Dysarthria due to recent cerebral infarction  02/17/2020   Obesity 02/16/2020   Hyperbilirubinemia 02/16/2020   CVA (cerebral vascular accident) ([place]) 02/15/2020   Thyroid mass 02/15/2020   Anxiety and depression 02/15/2020   Pulmonary emphysema ([place]) 06/02/2019   Aortic atherosclerosis ([place]) 06/02/2019   Smokers' cough ([place]) 06/02/2019   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   Multinodular goiter 12/24/2018   Subclinical hyperthyroidism 12/24/2018   BPH (benign prostatic hyperplasia) 11/20/2018   Pulmonary nodules 11/15/2018   Esophageal thickening 11/15/2018   Cigarette nicotine dependence without complication 31/49/7026   Old MI (myocardial infarction) 01/30/2017   CAD (coronary artery disease) 01/30/2017   Hyperlipidemia 10/10/2016   History of non-ST elevation myocardial infarction (NSTEMI) 10/09/2016   Tobacco abuse 08/28/2016   Difficulty sleeping 04/22/2016   Benign prostatic hyperplasia with urinary frequency 02/11/2016    Past Surgical History:  Procedure Laterality Date   COLONOSCOPY     CORONARY ANGIOPLASTY  10/09/2016   CORONARY STENT INTERVENTION N/A 10/09/2016   Procedure: CORONARY STENT INTERVENTION;  Surgeon: Nelva Bush, MD;  Location: [facility] CV LAB;  Service: Cardiovascular;  Laterality: N/A;   CYSTOSCOPY WITH RETROGRADE PYELOGRAM, URETEROSCOPY AND STENT PLACEMENT Left 06/17/2019   Procedure: CYSTOSCOPY WITH RETROGRADE PYELOGRAM, URETEROSCOPY AND STENT PLACEMENT;  Surgeon: Robley Fries, MD;  Location: [facility] ORS;  Service: Urology;  Laterality: Left;  1 HR   HOLMIUM LASER APPLICATION Left 03/16/8586  Procedure: HOLMIUM LASER APPLICATION;  Surgeon: Robley Fries, MD;  Location: [facility] OR[facility];  Service: Urology;  Laterality: Left;   INTRAVASCULAR ULTRASOUND/IVUS N/A 10/09/2016   Procedure: Intravascular Ultrasound/IVUS;  Surgeon: Nelva Bush, MD;  Location: [facility] CV LAB;  Service: Cardiovascular;  Laterality: N/A;   IR CHOLANGIOGRAM EXISTING TUBE  11/18/2018   IR PERC CHOLECYSTOSTOMY  11/16/2018    LEFT HEART CATH AND CORONARY ANGIOGRAPHY N/A 10/09/2016   Procedure: LEFT HEART CATH AND CORONARY ANGIOGRAPHY;  Surgeon: Nelva Bush, MD;  Location: [place] CV LAB;  Service: Cardiovascular;  Laterality: N/A;   LOOP RECORDER INSERTION N/A 02/20/2020   Procedure: LOOP RECORDER INSERTION;  Surgeon: Vickie Epley, MD;  Location: [facility] CV LAB;  Service: Cardiovascular;  Laterality: N/A;       Family History  Problem Relation Age of Onset   Alcohol abuse Father        called [facility] per pt   Cancer Father        [facility] PER PT NON SURGICAL    Colon cancer Neg Hx    Colon polyps Neg Hx    Esophageal cancer Neg Hx    Rectal cancer Neg Hx    Stomach cancer Neg Hx     Social History   Tobacco Use   Smoking status: Every Day    Packs/day: 2.00    Years: 45.00    Pack years: 90.00    Types: Cigarettes    Start date: 01/09/1961   Smokeless tobacco: Never   Tobacco comments:    tobacco info given  Vaping Use   Vaping Use: Never used  Substance Use Topics   Alcohol use: No   Drug use: No    Home Medications Prior to Admission medications   Medication Sig Start Date End Date Taking? Authorizing Provider  acetaminophen (TYLENOL) 325 MG tablet Take 2 tablets (650 mg total) by mouth every 4 (four) hours as needed for mild pain (or temp > 37.5 C (99.5 F)). 03/08/20   Angiulli, Lavon Paganini, PA-C  albuterol (VENTOLIN HFA) 108 (90 Base) MCG/ACT inhaler INHALE 2 PUFFS INTO THE LUNGS EVERY SIX HOURS AS NEEDED. 03/08/20 03/08/21  Angiulli, Lavon Paganini, PA-C  apixaban (ELIQUIS) 5 MG TABS tablet Take 1 tablet (5 mg total) by mouth 2 (two) times daily. 04/23/20   Evans Lance, MD  aspirin EC 81 MG tablet Take 1 tablet (81 mg total) by mouth daily. Swallow whole. 04/27/20   Vickie Epley, MD  atorvastatin (LIPITOR) 80 MG tablet Take 1 tablet (80 mg total) by mouth daily. 03/08/20   Angiulli, Lavon Paganini, PA-C  Baclofen 5 MG TABS Take 5 mg by mouth 4 (four) times daily.  03/31/20   Lovorn, Jinny Blossom, MD  citalopram (CELEXA) 10 MG tablet Take 1 tablet (10 mg total) by mouth daily. 03/31/20   Lovorn, Jinny Blossom, MD  cyclobenzaprine (FLEXERIL) 5 MG tablet Take 5 mg by mouth 3 (three) times daily as needed for muscle spasms.    [provider]  diclofenac Sodium (VOLTAREN) 1 % GEL Apply 4 g topically 4 (four) times daily as needed. 03/08/20   Lovorn, Jinny Blossom, MD  dicyclomine (BENTYL) 20 MG tablet Take 1 tablet (20 mg total) by mouth 2 (two) times daily. 04/05/20   Marcello Fennel, PA-C  lidocaine (LIDODERM) 5 % PLACE 1 PATCH ONTO THE SKIN DAILY. REMOVE & DISCARD PATCH WITHIN 12 HOURS OR AS DIRECTED BY MD Patient taking differently: 1 patch daily. Remove &  discard patch within 12 hours or as directed by MD 03/08/20 03/08/21  Angiulli, Lavon Paganini, PA-C  magnesium oxide (MAG-OX) 400 (241.3 Mg) MG tablet Take 1 tablet (400 mg total) by mouth daily. 03/08/20   Angiulli, Lavon Paganini, PA-C  melatonin 3 MG TABS tablet Take 1 tablet (3 mg total) by mouth at bedtime. 03/08/20   Angiulli, Lavon Paganini, PA-C  methimazole (TAPAZOLE) 5 MG tablet TAKE 1 TABLET(5 MG) BY MOUTH DAILY 03/08/20   Angiulli, Lavon Paganini, PA-C  metoprolol succinate (TOPROL XL) 25 MG 24 hr tablet Take 1 tablet (25 mg total) by mouth daily. 04/23/20   Evans Lance, MD  nicotine (NICODERM CQ - DOSED IN MG/24 HOURS) 21 mg/24hr patch 21 mg patch daily x2 weeks then 14 mg patch daily x3 weeks then 7 mg patch daily x3 weeks and stop 03/08/20   Angiulli, Lavon Paganini, PA-C  pantoprazole (PROTONIX) 40 MG tablet Take 1 tablet (40 mg total) by mouth daily. 03/08/20   Angiulli, Lavon Paganini, PA-C  senna-docusate (SENOKOT-S) 8.6-50 MG tablet Take 1 tablet by mouth 2 (two) times daily. 03/08/20   Angiulli, Lavon Paganini, PA-C  tamsulosin (FLOMAX) 0.4 MG CAPS capsule Take 2 capsules (0.8 mg total) by mouth daily after supper. 03/08/20   Angiulli, Lavon Paganini, PA-C  traMADol (ULTRAM) 50 MG tablet Take 1 tablet (50 mg total) by mouth every 12 (twelve) hours as  needed for severe pain. 04/26/20   Frann Rider, NP    Allergies    Patient has no known allergies.  Review of Systems   Review of Systems  Constitutional:  Negative for chills and fever.  HENT:  Negative for ear pain and sore throat.   Eyes:  Negative for pain and visual disturbance.  Respiratory:  Negative for cough and shortness of breath.   Cardiovascular:  Negative for chest pain and palpitations.  Gastrointestinal:  Negative for abdominal pain and vomiting.  Genitourinary:  Negative for dysuria and hematuria.  Musculoskeletal:  Negative for arthralgias and back pain.  Skin:  Negative for color change and rash.  Neurological:  Positive for speech difficulty, weakness and numbness. Negative for seizures, syncope, facial asymmetry and headaches.  All other systems reviewed and are negative.  Physical Exam Updated Vital Signs BP 118/65   Pulse 61   Temp 98.2 F (36.8 C) (Oral)   Resp 19   Ht 1.854 m (6\' 1" )   Wt 101.2 kg   SpO2 94%   BMI 29.42 kg/m   Physical Exam Vitals and nursing note reviewed.  Constitutional:      Appearance: Normal appearance. He is well-developed.  HENT:     Head: Normocephalic and atraumatic.  Eyes:     Extraocular Movements: Extraocular movements intact.     Conjunctiva/sclera: Conjunctivae normal.     Pupils: Pupils are equal, round, and reactive to light.  Cardiovascular:     Rate and Rhythm: Normal rate and regular rhythm.     Heart sounds: No murmur heard. Pulmonary:     Effort: Pulmonary effort is normal. No respiratory distress.     Breath sounds: Normal breath sounds.  Abdominal:     Palpations: Abdomen is soft.     Tenderness: There is no abdominal tenderness.  Musculoskeletal:        General: No swelling. Normal range of motion.     Cervical back: Normal range of motion and neck supple.  Skin:    General: Skin is warm and dry.  Neurological:  Mental Status: He is alert.     Cranial Nerves: Cranial nerve deficit  present.     Motor: Weakness present.     Comments: Patient with some speech difficulties.  Patient with some grip strength weakness to the left hand.  And patient with some slight weakness to elevation of the left leg.    ED Results / Procedures / Treatments   Labs (all labs ordered are listed, but only abnormal results are displayed) Labs Reviewed  CBC - Abnormal; Notable for the following components:      Result Value   RBC 5.91 (*)    Platelets 141 (*)    All other components within normal limits  COMPREHENSIVE METABOLIC PANEL - Abnormal; Notable for the following components:   Glucose, Bld 118 (*)    All other components within normal limits  I-STAT CHEM 8, ED - Abnormal; Notable for the following components:   Glucose, Bld 116 (*)    Calcium, Ion 1.12 (*)    All other components within normal limits  CBG MONITORING, ED - Abnormal; Notable for the following components:   Glucose-Capillary 118 (*)    All other components within normal limits  PROTIME-INR  APTT  DIFFERENTIAL  URINALYSIS, ROUTINE W REFLEX MICROSCOPIC    EKG EKG Interpretation  Date/Time:  Saturday July 10 2020 09:57:18 EDT Ventricular Rate:  62 PR Interval:  192 QRS Duration: 130 QT Interval:  443 QTC Calculation: 450 R Axis:   -71 Text Interpretation: Sinus rhythm Nonspecific IVCD with LAD Confirmed by Fredia Sorrow [phone]) on 07/10/2020 10:18:22 AM  Radiology MR BRAIN WO CONTRAST  Result Date: 07/10/2020 CLINICAL DATA:  Code stroke. 70 year old male with left side weakness and numbness. Right frontal lobe white matter infarct in February. CTA negative for large vessel occlusion and negative CTP today. Intracranial atherosclerosis. EXAM: MRI HEAD WITHOUT CONTRAST TECHNIQUE: Multiplanar, multiecho pulse sequences of the brain and surrounding structures were obtained without intravenous contrast. COMPARISON:  CTA head and neck, CTP today.  Brain MRI 02/15/2020. FINDINGS: Brain: Small area of new cortical  and subcortical white matter restricted diffusion in the posterior right middle frontal gyrus (series 7, image 53) in proximity 2 but different from the white matter area affected in February. Minimal associated T2 and FLAIR hyperintensity. No acute hemorrhage or mass effect. No other restricted diffusion. No midline shift, mass effect, evidence of mass lesion, ventriculomegaly, extra-axial collection or acute intracranial hemorrhage. Cervicomedullary junction and pituitary are within normal limits. Small area of cystic white matter encephalomalacia corresponding to the February infarct with stable widespread patchy and confluent white matter T2 and FLAIR hyperintensity elsewhere. No chronic cerebral blood products. No other new signal abnormality, with stable small chronic lacunar infarcts in the right deep gray matter nuclei and the cerebellum. Vascular: Major intracranial vascular flow voids are stable. Skull and upper cervical spine: Negative. Sinuses/Orbits: Stable, negative. Other: Mastoids remain clear. Negative visible internal auditory structures. IMPRESSION: 1. Positive for a small acute cortical infarct in the posterior right middle frontal gyrus - located near the February white matter infarct. No associated hemorrhage or mass effect. 2. Otherwise stable chronic small vessel disease. Electronically Signed   By: Genevie [name] M.D.   On: 07/10/2020 11:21   CT CEREBRAL PERFUSION W CONTRAST  Result Date: 07/10/2020 CLINICAL DATA:  Code stroke. 70 year old male with left side weakness and numbness. EXAM: CT ANGIOGRAPHY HEAD AND NECK CT PERFUSION BRAIN TECHNIQUE: Multidetector CT imaging of the head and neck was performed using the standard protocol  during bolus administration of intravenous contrast. Multiplanar CT image reconstructions and MIPs were obtained to evaluate the vascular anatomy. Carotid stenosis measurements (when applicable) are obtained utilizing NASCET criteria, using the distal internal carotid  diameter as the denominator. Multiphase CT imaging of the brain was performed following IV bolus contrast injection. Subsequent parametric perfusion maps were calculated using RAPID software. CONTRAST:  125mL OMNIPAQUE IOHEXOL 350 MG/ML SOLN COMPARISON:  Plain head CT 0932 hours today. CTA head and neck and brain MRI 02/15/2020. FINDINGS: CT Brain Perfusion Findings: ASPECTS: 10 CBF (<30%) Volume: None Perfusion (Tmax>6.0s) volume: None Mismatch Volume: Not applicable Infarction Location:Not applicable CTA NECK Skeleton: Absent dentition. No acute osseous abnormality identified. Upper chest: Emphysema.  Stable visible upper chest. 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 Stable mild regional mass effect. Negative neck soft tissues elsewhere. Aortic arch: Calcified aortic atherosclerosis. 4 vessel arch configuration, the left vertebral artery arises directly from the arch. Right carotid system: Stable mild cervical carotid atherosclerosis without stenosis. Left carotid system: Stable minimal cervical carotid atherosclerosis with no stenosis. Vertebral arteries: Non dominant right vertebral artery is diminutive throughout the neck and appears to functionally terminates both in PICA and a superior muscular branch at the skull base. Stable proximal right subclavian calcified plaque without stenosis. Left vertebral artery is dominant and arises directly from the arch with no origin stenosis. Late entry of the left vertebral into the cervical transverse foramen with no plaque or stenosis to the skull base. CTA HEAD Posterior circulation: Left V4 segment atherosclerosis and irregularity are mild. Mild stenosis proximal to the basilar. Similar mild basilar irregularity and stenosis is stable since February. SCA origins remain patent. Fetal type bilateral PCA origins again noted. Bilateral PCAs are stable and within normal limits. Anterior circulation: Both ICA siphons are patent with  minimal calcified plaque. Normal posterior communicating artery origins. Patent and normal carotid termini, MCA and ACA origins. Anterior communicating artery is diminutive. Bilateral ACA branches are stable and within normal limits. Left MCA M1 segment and bifurcation are patent without stenosis. Right MCA M1 segment and bifurcation are patent without stenosis. Bilateral MCA branches are stable and within normal limits when allowing for the contrast bolus today. Venous sinuses: Patent. Anatomic variants: Dominant left vertebral artery arises directly from the arch. Non dominant right vertebral artery functionally terminates at the skull base. Fetal type bilateral PCA origins. Review of the MIP images confirms the above findings IMPRESSION: 1. Negative for large vessel occlusion, and no core infarct or ischemic penumbra detected by CT Perfusion. 2. Stable CTA Head and Neck since February. Minimal extracranial atherosclerosis. Posterior circulation atherosclerosis, although with only mild stenosis of the distal left vertebral and basilar. Dominant right vertebral artery is diminutive and functionally terminates at the skull base. 3. 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 Aortic Atherosclerosis (ICD10-I70.0) and Emphysema (ICD10-J43.9). Electronically Signed   By: Genevie [name] M.D.   On: 07/10/2020 10:07   DG Chest Port 1 View  Result Date: 07/10/2020 CLINICAL DATA:  Atrial fibrillation. EXAM: PORTABLE CHEST 1 VIEW COMPARISON:  06/16/2020 FINDINGS: The heart size and mediastinal contours are within normal limits. Both lungs are clear. The visualized skeletal structures are unremarkable. IMPRESSION: No active disease. Electronically Signed   By: Nolon Nations M.D.   On: 07/10/2020 10:59   CT HEAD CODE STROKE WO CONTRAST  Result Date: 07/10/2020 CLINICAL DATA:  Code stroke. 70 year old male with left side weakness and numbness. EXAM: CT HEAD WITHOUT CONTRAST TECHNIQUE:  Contiguous axial images  were obtained from the base of the skull through the vertex without intravenous contrast. COMPARISON:  Brain MRI, CTA head and neck and head CT 02/15/2020. FINDINGS: Brain: Small focus of encephalomalacia now corresponding to the posterior right frontal lobe white matter infarct in February (series 3, image 26). Stable patchy white matter hypodensity elsewhere. Stable heterogeneity in the right basal ganglia. No midline shift, ventriculomegaly, mass effect, evidence of mass lesion, intracranial hemorrhage or evidence of cortically based acute infarction. Vascular: Mild calcified atherosclerosis at the skull base. No suspicious intracranial vascular hyperdensity. Evidence of calcified circle-of-Willis branch atherosclerosis, most apparent in the left MCA territory. Skull: No acute osseous abnormality identified. Sinuses/Orbits: Visualized paranasal sinuses and mastoids are stable and well aerated. Other: No acute orbit or scalp soft tissue finding. ASPECTS [facility] Stroke Program Early CT Score) Total score (0-10 with 10 being normal): 10 (right MCA territory encephalomalacia). IMPRESSION: 1. Expected appearance of the February infarct in the posterior right frontal lobe white matter. 2. No acute cortically based infarct or acute intracranial hemorrhage identified. ASPECTS 10. 3. Underlying chronic small vessel disease. 4. These results were communicated to Dr. Rory Percy at 9:38 am on 07/10/2020 by text page via the [facility] messaging system. Electronically Signed   By: Genevie [name] M.D.   On: 07/10/2020 09:39   CT ANGIO HEAD NECK W WO CM (CODE STROKE)  Result Date: 07/10/2020 CLINICAL DATA:  Code stroke. 70 year old male with left side weakness and numbness. EXAM: CT ANGIOGRAPHY HEAD AND NECK CT PERFUSION BRAIN TECHNIQUE: Multidetector CT imaging of the head and neck was performed using the standard protocol during bolus administration of intravenous contrast. Multiplanar CT image reconstructions and MIPs were obtained to  evaluate the vascular anatomy. Carotid stenosis measurements (when applicable) are obtained utilizing NASCET criteria, using the distal internal carotid diameter as the denominator. Multiphase CT imaging of the brain was performed following IV bolus contrast injection. Subsequent parametric perfusion maps were calculated using RAPID software. CONTRAST:  140mL OMNIPAQUE IOHEXOL 350 MG/ML SOLN COMPARISON:  Plain head CT 0932 hours today. CTA head and neck and brain MRI 02/15/2020. FINDINGS: CT Brain Perfusion Findings: ASPECTS: 10 CBF (<30%) Volume: None Perfusion (Tmax>6.0s) volume: None Mismatch Volume: Not applicable Infarction Location:Not applicable CTA NECK Skeleton: Absent dentition. No acute osseous abnormality identified. Upper chest: Emphysema.  Stable visible upper chest. 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 Stable mild regional mass effect. Negative neck soft tissues elsewhere. Aortic arch: Calcified aortic atherosclerosis. 4 vessel arch configuration, the left vertebral artery arises directly from the arch. Right carotid system: Stable mild cervical carotid atherosclerosis without stenosis. Left carotid system: Stable minimal cervical carotid atherosclerosis with no stenosis. Vertebral arteries: Non dominant right vertebral artery is diminutive throughout the neck and appears to functionally terminates both in PICA and a superior muscular branch at the skull base. Stable proximal right subclavian calcified plaque without stenosis. Left vertebral artery is dominant and arises directly from the arch with no origin stenosis. Late entry of the left vertebral into the cervical transverse foramen with no plaque or stenosis to the skull base. CTA HEAD Posterior circulation: Left V4 segment atherosclerosis and irregularity are mild. Mild stenosis proximal to the basilar. Similar mild basilar irregularity and stenosis is stable since February. SCA origins remain patent. Fetal  type bilateral PCA origins again noted. Bilateral PCAs are stable and within normal limits. Anterior circulation: Both ICA siphons are patent with minimal calcified plaque. Normal posterior communicating artery origins. Patent  and normal carotid termini, MCA and ACA origins. Anterior communicating artery is diminutive. Bilateral ACA branches are stable and within normal limits. Left MCA M1 segment and bifurcation are patent without stenosis. Right MCA M1 segment and bifurcation are patent without stenosis. Bilateral MCA branches are stable and within normal limits when allowing for the contrast bolus today. Venous sinuses: Patent. Anatomic variants: Dominant left vertebral artery arises directly from the arch. Non dominant right vertebral artery functionally terminates at the skull base. Fetal type bilateral PCA origins. Review of the MIP images confirms the above findings IMPRESSION: 1. Negative for large vessel occlusion, and no core infarct or ischemic penumbra detected by CT Perfusion. 2. Stable CTA Head and Neck since February. Minimal extracranial atherosclerosis. Posterior circulation atherosclerosis, although with only mild stenosis of the distal left vertebral and basilar. Dominant right vertebral artery is diminutive and functionally terminates at the skull base. 3. 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 Aortic Atherosclerosis (ICD10-I70.0) and Emphysema (ICD10-J43.9). Electronically Signed   By: Genevie [name] M.D.   On: 07/10/2020 10:07    Procedures Procedures  CRITICAL CARE Performed by: Fredia Sorrow Total critical care time: 40 minutes Critical care time was exclusive of separately billable procedures and treating other patients. Critical care was necessary to treat or prevent imminent or life-threatening deterioration. Critical care was time spent personally by me on the following activities: development of treatment plan with patient and/or surrogate as well as nursing,  discussions with consultants, evaluation of patient's response to treatment, examination of patient, obtaining history from patient or surrogate, ordering and performing treatments and interventions, ordering and review of laboratory studies, ordering and review of radiographic studies, pulse oximetry and re-evaluation of patient's condition.   Medications Ordered in ED Medications  sodium chloride flush (NS) 0.9 % injection 3 mL (3 mLs Intravenous Given 07/10/20 1022)  iohexol (OMNIPAQUE) 350 MG/ML injection 100 mL (100 mLs Intravenous Contrast Given 07/10/20 0956)    ED Course  I have reviewed the triage vital signs and the nursing notes.  Pertinent labs & imaging results that were available during my care of the patient were reviewed by me and considered in my medical decision making (see chart for details).    MDM Rules/Calculators/A&P                         Patient arrived as code stroke.  Stroke protocols activated.  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  Patient's symptoms are on that side.  But he states that they are worse than usual.  Head CT showed no acute findings.  Last known normal was 2200 last night.  Patient is on Eliquis.  Patient not a candidate for tPA.  Neurologist have ordered CTA and then that will be followed up by MRI.  If these are all negative since patient is showing signs of improvement can probably be discharged back home according to them.  They will continue to follow and update [place].  We will screen for other having medical problems.  CTA head and neck and perfusion studies without evidence of any large vessel abnormalities.  MRI is consistent with a new small acute infarct close to the previous infarct.  Based on this patient most likely will require admission.  Will contact hospitalist.  Final Clinical Impression(s) / ED Diagnoses Final diagnoses:  Cerebrovascular accident (CVA), unspecified mechanism ([place])    Rx / DC Orders ED Discharge Orders  None        Fredia Sorrow, MD 07/10/20 6834    Fredia Sorrow, MD 07/10/20 [phone]

## 2020-07-10 NOTE — ED Notes (Signed)
Returned from MRI 

## 2020-07-26 NOTE — Progress Notes (Signed)
[facility] Summary Report / Loop Recorder 

## 2020-08-11 LAB — CUP PACEART REMOTE DEVICE CHECK
Date Time Interrogation Session: 20220730162023
Implantable Pulse Generator Implant Date: 20220211

## 2020-08-12 ENCOUNTER — Ambulatory Visit (INDEPENDENT_AMBULATORY_CARE_PROVIDER_SITE_OTHER): Payer: Medicare Other | Admitting: Adult Health

## 2020-08-12 ENCOUNTER — Encounter: Payer: Self-pay | Admitting: Adult Health

## 2020-08-12 VITALS — BP 110/54 | HR 88 | Ht 73.0 in | Wt 219.0 lb

## 2020-08-12 DIAGNOSIS — I480 Paroxysmal atrial fibrillation: Secondary | ICD-10-CM

## 2020-08-12 DIAGNOSIS — E785 Hyperlipidemia, unspecified: Secondary | ICD-10-CM

## 2020-08-12 DIAGNOSIS — R7309 Other abnormal glucose: Secondary | ICD-10-CM | POA: Diagnosis not present

## 2020-08-12 DIAGNOSIS — E538 Deficiency of other specified B group vitamins: Secondary | ICD-10-CM | POA: Diagnosis not present

## 2020-08-12 DIAGNOSIS — F331 Major depressive disorder, recurrent, moderate: Secondary | ICD-10-CM

## 2020-08-12 DIAGNOSIS — I639 Cerebral infarction, unspecified: Secondary | ICD-10-CM | POA: Diagnosis not present

## 2020-08-12 DIAGNOSIS — I63511 Cerebral infarction due to unspecified occlusion or stenosis of right middle cerebral artery: Secondary | ICD-10-CM | POA: Diagnosis not present

## 2020-08-12 NOTE — Patient Instructions (Signed)
Referral placed to psychiatry as requested - you should be called to schedule evaluation   Referral placed to [facility] and adult medicine to establish care with PCP  We will check lab work today - you will see the results tomorrow on your MyChart - we will further review them on Monday and call you with any concerns  Continue aspirin 81 mg daily and Eliquis (apixaban) TWICE DAILY and atorvastatin  for secondary stroke prevention  Continue to follow up with PCP regarding cholesterol and blood pressure management  Maintain strict control of hypertension with blood pressure goal below 130/90 and cholesterol with LDL cholesterol (bad cholesterol) goal below 70 mg/dL.       Followup in the future with me in 4 months or call earlier if needed       Thank you for coming to see [place] at [facility]. I hope we have been able to provide you high quality care today.  You may receive a patient satisfaction survey over the next few weeks. We would appreciate your feedback and comments so that we may continue to improve ourselves and the health of our patients.

## 2020-08-12 NOTE — Progress Notes (Signed)
[facility] [address] [place]. [place] [address] [phone]       STROKE FOLLOW UP NOTE  David Hendricks Date of Birth:  January 04, 1951 Medical Record Number:  [id number]   Reason for Referral: stroke follow up    SUBJECTIVE:   CHIEF COMPLAINT:  Chief Complaint  Patient presents with   Follow-up    Rm 3 alone here for 3 month f/u. Reports some numbness/weakness in his left hand along with leg cramps.     HPI:   Today, 08/12/2020, David Hendricks returns for scheduled stroke follow-up visit but has since been evaluated in the ED for new stroke. At prior visit in April, he was recovering well with minimal left-sided weakness and dysarthria.  He presented on 07/10/2020 with left hand and arm numbness and worsening dysarthria upon awakening.  Evaluated by Dr. Rory Percy. Per note, taking Eliquis but only once daily despite being prescribed twice daily. CTA head/neck neg LVO. MR brain did show area of acute infarct adjacent to prior stroke and right periventricular area -recommended admission for further stroke work-up but patient declined and left AMA.   Reports continued left hand weakness and left pointer and ring finger numbness. Has been gradually improving but interferes with daily functioning as he is left-hand dominant.  Denies new stroke/TIA symptoms since discharge.  Also reports chronic low back pain which has been progressing and is scheduled to see a "spine and back specialist" next Thursday. BLE cramping usually more in the morning upon awakening which is not new.  He has not yet established care with PCP -is previously receiving B12 injections but none over the past 8 months (per pt). Due to lower back issues and recurrent stroke, he feels like depression has worsened - he questions being referred to psychiatry.  He reports taking Eliquis but still only 1 time per day - reports he will start taking twice per day. He is unsure if he has continued on aspirin (as advised by  cards for CAD s/p stent).  He has continued taking atorvastatin.  Blood pressure 110/54.  No further concerns at this tim     MR BRAIN 07/10/2020 IMPRESSION: 1. Positive for a small acute cortical infarct in the posterior right middle frontal gyrus - located near the February white matter infarct. No associated hemorrhage or mass effect. 2. Otherwise stable chronic small vessel disease.  CTA HEAD/NECK CT PERFUSION 07/10/2020 IMPRESSION: 1. Negative for large vessel occlusion, and no core infarct or ischemic penumbra detected by CT Perfusion.   2. Stable CTA Head and Neck since February. Minimal extracranial atherosclerosis. Posterior circulation atherosclerosis, although with only mild stenosis of the distal left vertebral and basilar. Dominant right vertebral artery is diminutive and functionally terminates at the skull base.     History provided for reference purposes only Initial visit 04/26/2020 JM: [name] is being seen for hospital follow-up unaccompanied  Reports residual mild left sided weakness and dysphagia but continued gradual recovery.  Denies residual speech difficulties.  Currently working with [facility] SLP - was cleared by PT/OT.  Recent swallowing study did not show residual dysphagia but reported decreased base of tongue pressure against pharyngeal wall and reduced pharyngeal stripping in general and recommended continued participation with [facility] S[facility] for further strengthening exercises.  He has remained on a regular diet without difficulty.  He currently lives alone maintaining ADLs and IADLs independently. He requests return back to driving. Concern of blurred vision post stroke per PMR OV note - he does note  mild blurred vision but this was present prior to his stroke -he was evaluated by ophthalmology and currently awaiting to receive new prescription glasses.  Loop recorder showed evidence of atrial fibrillation and RVR on 4/15 and started on Eliquis 5 mg twice daily as  well as metoprolol.  He has remained on Eliquis since that time without associated side effects and has scheduled follow-up with cardiology tomorrow.  He has also remained on Plavix Remains on atorvastatin 80 mg daily without associated side effects Blood pressure today 124/62 - monitors at home and has been stable  He does have history of chronic pain on tramadol PTA which he will use occasionally for pain that interferes with sleeping.  He is currently in the process of switching PCP office and in doing so, has had difficulty obtaining tramadol prescription - he requests short-term refill today until he is able to establish care with new PCP  No further concerns at this time  Stroke admission 02/15/2020 David Hendricks is a 70 y.o. male with history of HLD, CAD with hx MI, COPD, depression, tobacco abuse, GERD, nephrolithiasis, hyperthyroidism, and CKD who presented on 02/15/2020 with slurred speech, Rt facial droop and Left arm weakness.  Personally reviewed hospitalization pertinent progress notes, lab work and imaging with summary provided. Stroke work up revealed acute right MCA cortical and subcortical infarcts, concerning for embolic pattern secondary to unclear source. Loop recorder placed to further eval for atrial fibrillation. Recommended DAPT for 3 weeks then plavix alone as on aspirin PTA. LDL 118 on atorvastatin 80 mg daily. Current tobacco use with cessation counseling provided. Other stroke risk factors include CAD w/ history of MI, advanced age and irregular rhythm noted in prior diagnosis. Residual deficit of left arm weakness with dexterity, left facial weakness, dysarthria and dysphagia.  Evaluated by therapies and recommended discharge to CIR for ongoing therapy needs.  Stroke: Acute right MCA cortical and subcortical infarcts, concerning for embolic pattern, source unclear CT Code Stroke- No evidence of acute large vascular territory infarct.  CTA head & neck- The nondominant right  vertebral artery is diminutive throughout its course and likely occluded intradurally. Diminutive vertebrobasilar system intracranially with bilateral fetal type PCAs, anatomic variant.  MRI -with 6 today is am not sure dilatation to the same thing cortical and subcortical infarct in the high right posterior frontal lobe. Small focus of susceptibility artifact in this region may represent a small thrombosed distal cortical MCA Branch. Possible additional punctate infarct in the right parietal cortex versus artifact. 2D Echo -  LVEF 60-65%  Doppler of lower extremities - No DVT Recommend loop recorder to rule out afib on discharge - EP aware LDL 111 HgbA1c 5.2 VTE prophylaxis - SCD's aspirin 81 mg daily prior to admission, now on aspirin 81 mg daily and clopidogrel 75 mg daily. Continue DAPT for 3 weeks and then plavix alone. (On Hold as Pt is NPO, Can restart if Pt passes swallow test).  ASA 300 mg suppository until he has po access. Therapy recommendations: CIR  Disposition:  CIR     ROS:   14 system review of systems performed and negative with exception of those listed in HPI  PMH:  Past Medical History:  Diagnosis Date   Allergy    Anxiety    Aortic atherosclerosis (HCC)    Arthritis    Benign prostatic hyperplasia (BPH) with urinary urgency    Chronic kidney disease    KIDNEY STONES   COPD (chronic obstructive pulmonary disease) ([place])  possible    COPD (chronic obstructive pulmonary disease) ([place])    Coronary artery disease    10/18 PCI/DES to pLAD, mild nonobstructive disease in the Lcx/RCA. Normal EF.    Depression    Dyslipidemia    Dysrhythmia 2016   irregular heartbeat   Gallstones    GERD (gastroesophageal reflux disease)    History of kidney stones    Hyperlipidemia    Hyperthyroidism    Leg cramps    Multiple pulmonary nodules    Myocardial infarction ([place])    2018   Pneumonia    Seasonal allergies    Stroke ([place])    Thyroid nodule    Vertigo     Vitamin B 12 deficiency     PSH:  Past Surgical History:  Procedure Laterality Date   COLONOSCOPY     CORONARY ANGIOPLASTY  10/09/2016   CORONARY STENT INTERVENTION N/A 10/09/2016   Procedure: CORONARY STENT INTERVENTION;  Surgeon: Nelva Bush, MD;  Location: [place] [facility];  Service: Cardiovascular;  Laterality: N/A;   CYSTOSCOPY WITH RETROGRADE PYELOGRAM, URETEROSCOPY AND STENT PLACEMENT Left 06/17/2019   Procedure: CYSTOSCOPY WITH RETROGRADE PYELOGRAM, URETEROSCOPY AND STENT PLACEMENT;  Surgeon: Robley Fries, MD;  Location: [facility];  Service: Urology;  Laterality: Left;  1 HR   HOLMIUM LASER APPLICATION Left [id number]   Procedure: HOLMIUM LASER APPLICATION;  Surgeon: Robley Fries, MD;  Location: [facility];  Service: Urology;  Laterality: Left;   INTRAVASCULAR ULTRASOUND/IVUS N/A 10/09/2016   Procedure: Intravascular Ultrasound/IVUS;  Surgeon: Nelva Bush, MD;  Location: [place] [facility];  Service: Cardiovascular;  Laterality: N/A;   IR CHOLANGIOGRAM EXISTING TUBE  11/18/2018   IR PERC CHOLECYSTOSTOMY  11/16/2018   LEFT HEART CATH AND CORONARY ANGIOGRAPHY N/A 10/09/2016   Procedure: LEFT HEART CATH AND CORONARY ANGIOGRAPHY;  Surgeon: Nelva Bush, MD;  Location: [place] [facility];  Service: Cardiovascular;  Laterality: N/A;   LOOP RECORDER INSERTION N/A 02/20/2020   Procedure: LOOP RECORDER INSERTION;  Surgeon: Vickie Epley, MD;  Location: [place] [facility];  Service: Cardiovascular;  Laterality: N/A;    Social History:  Social History   Socioeconomic History   Marital status: Divorced    Spouse name: Not on file   Number of children: 0   Years of education: Not on file   Highest education level: Not on file  Occupational History   Occupation: retired Sports coach enforment  Tobacco Use   Smoking status: Every Day    Packs/day: 2.00    Years: 45.00    Pack years: 90.00    Types: Cigarettes    Start date: 01/09/1961   Smokeless tobacco: Never   Tobacco  comments:    tobacco info given  Vaping Use   Vaping Use: Never used  Substance and Sexual Activity   Alcohol use: No   Drug use: No   Sexual activity: Not on file  Other Topics Concern   Not on file  Social History Narrative   Not on file   Social Determinants of Health   Financial Resource Strain: Not on file  Food Insecurity: Not on file  Transportation Needs: Not on file  Physical Activity: Not on file  Stress: Not on file  Social Connections: Not on file  Intimate Partner Violence: Not on file    Family History:  Family History  Problem Relation Age of Onset   Alcohol abuse Father        called [place] per pt  Cancer Father        O[place] CELL CANCER PER PT NON SURGICAL    Colon cancer Neg Hx    Colon polyps Neg Hx    Esophageal cancer Neg Hx    Rectal cancer Neg Hx    Stomach cancer Neg Hx     Medications:   Current Outpatient Medications on File Prior to Visit  Medication Sig Dispense Refill   apixaban (ELIQUIS) 5 MG TABS tablet Take 1 tablet (5 mg total) by mouth 2 (two) times daily. 60 tablet 3   atorvastatin (LIPITOR) 80 MG tablet Take 1 tablet (80 mg total) by mouth daily. 30 tablet 3   Baclofen 5 MG TABS Take 5 mg by mouth 4 (four) times daily. 120 tablet 5   citalopram (CELEXA) 10 MG tablet Take 1 tablet (10 mg total) by mouth daily. 30 tablet 5   cyclobenzaprine (FLEXERIL) 5 MG tablet Take 5 mg by mouth 3 (three) times daily as needed for muscle spasms.     gabapentin (NEURONTIN) 100 MG capsule Take 100 mg by mouth daily as needed (pain).     melatonin 3 MG TABS tablet Take 1 tablet (3 mg total) by mouth at bedtime. 30 tablet 0   methimazole (TAPAZOLE) 5 MG tablet TAKE 1 TABLET(5 MG) BY MOUTH DAILY (Patient taking differently: Take 5 mg by mouth daily.) 30 tablet 1   metoprolol succinate (TOPROL XL) 25 MG 24 hr tablet Take 1 tablet (25 mg total) by mouth daily. 30 tablet 3   tamsulosin (FLOMAX) 0.4 MG CAPS capsule Take 2 capsules (0.8 mg total) by  mouth daily after supper. 30 capsule 0   traMADol (ULTRAM) 50 MG tablet Take 1 tablet (50 mg total) by mouth every 12 (twelve) hours as needed for severe pain. 30 tablet 0   traZODone (DESYREL) 50 MG tablet Take 50 mg by mouth at bedtime.     aspirin EC 81 MG tablet Take 1 tablet (81 mg total) by mouth daily. Swallow whole. (Patient not taking: Reported on 08/12/2020) 90 tablet 3   Current Facility-Administered Medications on File Prior to Visit  Medication Dose Route Frequency Provider Last Rate Last Admin   cyanocobalamin ((VITAMIN B-12)) injection 1,000 mcg  1,000 mcg Intramuscular Q30 days Horald Pollen, MD   1,000 mcg at 08/29/19 1525    Allergies:  No Known Allergies    OBJECTIVE:  Physical Exam  Vitals:   08/12/20 1523  BP: (!) 110/54  Pulse: 88  SpO2: 92%  Weight: 219 lb (99.3 kg)  Height: '6\' 1"'$  (1.854 m)    Body mass index is 28.89 kg/m. No results found.  General: well developed, well nourished,  very pleasant elderly Caucasian male, seated, in no evident distress Head: head normocephalic and atraumatic.   Neck: supple with no carotid or supraclavicular bruits Cardiovascular: regular rate and rhythm, no murmurs Musculoskeletal: no deformity Skin:  no rash/petichiae Vascular:  Normal pulses all extremities   Neurologic Exam Mental Status: Awake and fully alert.   Mild dysarthria but per patient baseline speech (no teeth or dentures).  Oriented to place and time. Recent and remote memory intact. Attention span, concentration and fund of knowledge appropriate. Mood and affect appropriate.  Cranial Nerves: Pupils equal, briskly reactive to light. Extraocular movements full without nystagmus. Visual fields full to confrontation. Hearing intact. Facial sensation intact. Face, tongue, palate moves normally and symmetrically.  Motor: Normal bulk and tone. Normal strength in all tested extremity muscles except slightly decreased left hand  dexterity and grip  strength Sensory.: intact to touch , pinprick , position and vibratory sensation.  Subjective left pointer finger and ring finger numbness Coordination: Rapid alternating movements normal in all extremities except slightly decreased left hand dexterity. Finger-to-nose and heel-to-shin performed accurately bilaterally. Gait and Station: Arises from chair without difficulty. Stance is normal. Gait demonstrates normal stride length and balance without use of assistive device. Tandem walk and heel toe moderate difficulty.  Reflexes: 1+ and symmetric. Toes downgoing.        ASSESSMENT: David Hendricks is a 70 y.o. year old male with right MCA cortical and subcortical strokes on [id number], embolic s/p ILR which showed evidence of A. fib on 04/23/2020.  On 07/10/2020, presented to ED with worsening dysarthria and left arm numbness/tingling with evidence of acute infarct adjacent to prior stroke, likely due to suboptimal use of Eliquis (taking 1x daily vs BID) - left AMA prior to complete stroke work up.  Vascular risk factors include atrial fibrillation, HLD, CAD w/ hx of MI, tobacco use and advanced age.      PLAN:  Recurrent R MCA stroke: R MCA stroke :  Residual deficit: Left hand weakness and numbness Further stroke work up: CTA head/neck completed (see HPI), will check stroke labs (A1c and lipips), need of repeat echo deferred to cardiology as just completed in 02/2020 Continue Eliquis (apixaban) daily  and atorvastatin for secondary stroke prevention.  Advised to restart aspirin 81 mg daily as recommended per cards for history of CAD s/p stent Discussed secondary stroke prevention measures and as well as establishing care with new PCP for routine follow-up and aggressive stroke risk factor management HLD: LDL goal <70. Prior LDL 111 (02/2020).  On atorvastatin 80 mg daily. Repeat today in setting of additional stroke Atrial fibrillation: found on ILR on 4/15.  Continue Eliquis with CHA2DS2-VASc score of  at least 4.  He was advised importance of taking Eliquis twice daily as prescribed for optimal benefit.  He verbalized understanding. Routine follow-up with Dr. Quentin Ore Referral placed to psychiatry for history of depression worsening with increased back pain and recent stroke and PCP ([facility]) to establish care B12 deficiency: Recheck B12 level today    Follow up in 4 months or call earlier if needed   CC:  [place] provider: Dr. Leonie Man PCP: Pcp, No    I spent 48 minutes of face-to-face and non-face-to-face time with patient.  This included previsit chart review including recent hospitalization pertinent progress notes, lab work and imaging, lab review, study review, order entry, electronic health record documentation, patient education regarding recent recurrent stroke and likely etiology, secondary stroke prevention measures and aggressive stroke risk factor management, residual deficits, and all other as listed above and answered all other questions to patient satisfaction   Frann Rider, AGNP-BC  [facility] [address] [place], [place] [address]  Phone [phone] Fax [phone] Note: This document was prepared with digital dictation and possible smart phrase technology. Any transcriptional errors that result from this process are unintentional.

## 2020-08-12 NOTE — Progress Notes (Signed)
I agree with the above plan 

## 2020-08-13 LAB — LIPID PANEL
Chol/HDL Ratio: 2.9 ratio (ref 0.0–5.0)
Cholesterol, Total: 108 mg/dL (ref 100–199)
HDL: 37 mg/dL — ABNORMAL LOW (ref >39)
LDL Chol Calc (NIH): 49 mg/dL (ref 0–99)
Triglycerides: 124 mg/dL (ref 0–149)
VLDL Cholesterol Cal: 22 mg/dL (ref 5–40)

## 2020-08-13 LAB — VITAMIN B12: Vitamin B-12: 444 pg/mL (ref 232–1245)

## 2020-08-13 LAB — HEMOGLOBIN A1C
Est. average glucose Bld gHb Est-mCnc: 108 mg/dL
Hgb A1c MFr Bld: 5.4 % (ref 4.8–5.6)

## 2020-08-16 ENCOUNTER — Ambulatory Visit (INDEPENDENT_AMBULATORY_CARE_PROVIDER_SITE_OTHER): Payer: Medicare Other

## 2020-08-16 DIAGNOSIS — I639 Cerebral infarction, unspecified: Secondary | ICD-10-CM | POA: Diagnosis not present

## 2020-08-19 ENCOUNTER — Emergency Department (HOSPITAL_COMMUNITY)
Admission: EM | Admit: 2020-08-19 | Discharge: 2020-08-19 | Disposition: A | Discharge status: Home / Self Care | Payer: Medicare Other | Attending: Emergency Medicine | Admitting: Emergency Medicine

## 2020-08-19 ENCOUNTER — Other Ambulatory Visit: Payer: Self-pay

## 2020-08-19 ENCOUNTER — Emergency Department (HOSPITAL_COMMUNITY): Payer: Medicare Other

## 2020-08-19 ENCOUNTER — Encounter (HOSPITAL_COMMUNITY): Payer: Self-pay

## 2020-08-19 DIAGNOSIS — R4701 Aphasia: Secondary | ICD-10-CM | POA: Diagnosis not present

## 2020-08-19 DIAGNOSIS — R531 Weakness: Secondary | ICD-10-CM | POA: Diagnosis not present

## 2020-08-19 DIAGNOSIS — N189 Chronic kidney disease, unspecified: Secondary | ICD-10-CM | POA: Diagnosis not present

## 2020-08-19 DIAGNOSIS — I2510 Atherosclerotic heart disease of native coronary artery without angina pectoris: Secondary | ICD-10-CM | POA: Diagnosis not present

## 2020-08-19 DIAGNOSIS — R4781 Slurred speech: Secondary | ICD-10-CM | POA: Insufficient documentation

## 2020-08-19 DIAGNOSIS — I959 Hypotension, unspecified: Secondary | ICD-10-CM | POA: Diagnosis not present

## 2020-08-19 DIAGNOSIS — R200 Anesthesia of skin: Secondary | ICD-10-CM | POA: Diagnosis not present

## 2020-08-19 DIAGNOSIS — F17210 Nicotine dependence, cigarettes, uncomplicated: Secondary | ICD-10-CM | POA: Diagnosis not present

## 2020-08-19 DIAGNOSIS — J449 Chronic obstructive pulmonary disease, unspecified: Secondary | ICD-10-CM | POA: Insufficient documentation

## 2020-08-19 DIAGNOSIS — R0902 Hypoxemia: Secondary | ICD-10-CM | POA: Diagnosis not present

## 2020-08-19 DIAGNOSIS — Z955 Presence of coronary angioplasty implant and graft: Secondary | ICD-10-CM | POA: Insufficient documentation

## 2020-08-19 DIAGNOSIS — Z7901 Long term (current) use of anticoagulants: Secondary | ICD-10-CM | POA: Insufficient documentation

## 2020-08-19 DIAGNOSIS — R519 Headache, unspecified: Secondary | ICD-10-CM | POA: Diagnosis not present

## 2020-08-19 DIAGNOSIS — G319 Degenerative disease of nervous system, unspecified: Secondary | ICD-10-CM | POA: Diagnosis not present

## 2020-08-19 DIAGNOSIS — I639 Cerebral infarction, unspecified: Secondary | ICD-10-CM | POA: Diagnosis not present

## 2020-08-19 DIAGNOSIS — Z7982 Long term (current) use of aspirin: Secondary | ICD-10-CM | POA: Insufficient documentation

## 2020-08-19 DIAGNOSIS — R202 Paresthesia of skin: Secondary | ICD-10-CM | POA: Diagnosis not present

## 2020-08-19 DIAGNOSIS — R001 Bradycardia, unspecified: Secondary | ICD-10-CM | POA: Diagnosis not present

## 2020-08-19 DIAGNOSIS — R29810 Facial weakness: Secondary | ICD-10-CM | POA: Diagnosis not present

## 2020-08-19 DIAGNOSIS — R0789 Other chest pain: Secondary | ICD-10-CM | POA: Diagnosis not present

## 2020-08-19 DIAGNOSIS — R079 Chest pain, unspecified: Secondary | ICD-10-CM | POA: Diagnosis not present

## 2020-08-19 DIAGNOSIS — G4489 Other headache syndrome: Secondary | ICD-10-CM | POA: Diagnosis not present

## 2020-08-19 LAB — COMPREHENSIVE METABOLIC PANEL
ALT: 16 U/L (ref 0–44)
AST: 21 U/L (ref 15–41)
Albumin: 3.5 g/dL (ref 3.5–5.0)
Alkaline Phosphatase: 121 U/L (ref 38–126)
Anion gap: 9 (ref 5–15)
BUN: 10 mg/dL (ref 8–23)
CO2: 26 mmol/L (ref 22–32)
Calcium: 9 mg/dL (ref 8.9–10.3)
Chloride: 102 mmol/L (ref 98–111)
Creatinine, Ser: 0.96 mg/dL (ref 0.61–1.24)
GFR, Estimated: >60 mL/min (ref >60)
Glucose, Bld: 100 mg/dL — ABNORMAL HIGH (ref 70–99)
Potassium: 3.9 mmol/L (ref 3.5–5.1)
Sodium: 137 mmol/L (ref 135–145)
Total Bilirubin: 0.7 mg/dL (ref 0.3–1.2)
Total Protein: 6.5 g/dL (ref 6.5–8.1)

## 2020-08-19 LAB — DIFFERENTIAL
Abs Immature Granulocytes: 0.02 10*3/uL (ref 0.00–0.07)
Basophils Absolute: 0.1 10*3/uL (ref 0.0–0.1)
Basophils Relative: 1 %
Eosinophils Absolute: 0.2 10*3/uL (ref 0.0–0.5)
Eosinophils Relative: 3 %
Immature Granulocytes: 0 %
Lymphocytes Relative: 25 %
Lymphs Abs: 2 10*3/uL (ref 0.7–4.0)
Monocytes Absolute: 0.5 10*3/uL (ref 0.1–1.0)
Monocytes Relative: 6 %
Neutro Abs: 5.2 10*3/uL (ref 1.7–7.7)
Neutrophils Relative %: 65 %

## 2020-08-19 LAB — CBC
HCT: 51.5 % (ref 39.0–52.0)
Hemoglobin: 16.9 g/dL (ref 13.0–17.0)
MCH: 28.3 pg (ref 26.0–34.0)
MCHC: 32.8 g/dL (ref 30.0–36.0)
MCV: 86.1 fL (ref 80.0–100.0)
Platelets: 154 10*3/uL (ref 150–400)
RBC: 5.98 MIL/uL — ABNORMAL HIGH (ref 4.22–5.81)
RDW: 12.6 % (ref 11.5–15.5)
WBC: 8 10*3/uL (ref 4.0–10.5)
nRBC: 0 % (ref 0.0–0.2)

## 2020-08-19 LAB — I-STAT CHEM 8, ED
BUN: 12 mg/dL (ref 8–23)
Calcium, Ion: 1.16 mmol/L (ref 1.15–1.40)
Chloride: 102 mmol/L (ref 98–111)
Creatinine, Ser: 0.8 mg/dL (ref 0.61–1.24)
Glucose, Bld: 97 mg/dL (ref 70–99)
HCT: 50 % (ref 39.0–52.0)
Hemoglobin: 17 g/dL (ref 13.0–17.0)
Potassium: 4.2 mmol/L (ref 3.5–5.1)
Sodium: 140 mmol/L (ref 135–145)
TCO2: 29 mmol/L (ref 22–32)

## 2020-08-19 LAB — PROTIME-INR
INR: 1 (ref 0.8–1.2)
Prothrombin Time: 13.5 seconds (ref 11.4–15.2)

## 2020-08-19 LAB — APTT: aPTT: 29 seconds (ref 24–36)

## 2020-08-19 LAB — TROPONIN I (HIGH SENSITIVITY): Troponin I (High Sensitivity): 6 ng/L (ref <18)

## 2020-08-19 MED ORDER — SODIUM CHLORIDE 0.9% FLUSH: 3.0000 mL | Freq: Once | INTRAVENOUS | Status: DC

## 2020-08-19 NOTE — ED Notes (Signed)
Pt refused dc temp

## 2020-08-19 NOTE — ED Notes (Signed)
Called lab to have troponin added onto previous collections and per lab, they are unable to do so d/t initial specimen collection was greater than 4 hrs ago. Will obtain troponin when pt back from MRI.

## 2020-08-19 NOTE — ED Triage Notes (Signed)
Pt arrives via [facility] for stroke evaluation. Pt has residual L sided deficits from CVA in March 2022. Pt had new onset of headache and L sided numbness approximately two days prior. Pt lives alone, true last known well unable to be obtained by EMS.   EMS last VS - 122/68, HR 58, RR 18, 96% on 2 lpm via [place] for comfort, CBG 131,   # 20 L hand

## 2020-08-19 NOTE — ED Provider Notes (Signed)
Emergency Medicine Provider Triage Evaluation Note  David Hendricks , a 70 y.o. male  was evaluated in triage.  Pt complains of headache, numbness to left hand.  Per chart review patient had CVA February 2022, on 07/10/2020 presented to ED with left hand and arm numbness as well as worsening dysarthria upon awakening, CTA and MRI did not show any acute infarct plan was for patient to be admitted to ED for further stroke work-up however left AMA.  He was seen by neurology team on 8/4 with reports of continued left hand weakness and numbness.  Patient presents today stating that for the last 2 nights he has gone to bed at 10pm and woke and had approximately 1:30 AM with generalized weakness throughout his entire body and difficulty falling back asleep.  No facial asymmetry, dysarthria, or aphasia reported with the symptoms.  Patient states today he is having numbness and weakness to left hand which is baseline for him.  He endorses headache to occipital and crown of his head.  Headache has been constant over the last 2 days.  Headache onset was gradual and pain progressively worse over time.  No visual disturbance or head injury reported.  Review of Systems  Positive: Numbness, weakness, headache Negative: Slurred speech, dysarthria, aphasia, visual disturbance, fevers, chills, chest pain, shortness of breath, URI symptoms  Physical Exam  BP 103/63 (BP Location: Right Arm)   Pulse (!) 58   Temp 97.8 F (36.6 C) (Oral)   Resp 16   Ht '6\' 1"'$  (1.854 m)   Wt 99.8 kg   SpO2 92%   BMI 29.03 kg/m  Gen:   Awake, no distress   Resp:  Normal effort  MSK:   Moves extremities without difficulty  Other:  Decreased sensation to left hand, CN II through XII intact, pronator drift negative, +5 strength to bilateral upper and lower extremities.  Medical Decision Making  Medically screening exam initiated at 12:59 PM.  Appropriate orders placed.  David Hendricks was informed that the remainder of the evaluation will be  completed by another provider, this initial triage assessment does not replace that evaluation, and the importance of remaining in the ED until their evaluation is complete.  The patient appears stable so that the remainder of the work up may be completed by another provider.      [name], PA-C 08/19/20 1304    Truddie Hidden, MD 08/19/20 [phone]

## 2020-08-19 NOTE — ED Notes (Signed)
Patient verbalizes understanding of discharge instructions. Opportunity for questioning and answers were provided. Armband removed by staff, pt discharged from ED ambulatory.   

## 2020-08-19 NOTE — ED Provider Notes (Signed)
[facility] EMERGENCY DEPARTMENT Provider Note   CSN: [id number] Arrival date & time: 08/19/20  1216     History Chief Complaint  Patient presents with   Stroke like symptoms    David Hendricks is a 70 y.o. male history of COPD, CKD, previous stroke on Xarelto, recent stroke here presenting with possible stroke.  Patient states that for the last 2 nights, he has been having numbness to the left arm whenever he wakes up.  He also has some occasional slurred speech as well.  Patient was seen in the ED about a month ago and had a stroke at that time.  Patient signed out [place].  Patient actually saw neurology in the meantime and was told to continue on Xarelto and have stroke work-up outpatient.  The history is provided by the patient.      Past Medical History:  Diagnosis Date   Allergy    Anxiety    Aortic atherosclerosis (HCC)    Arthritis    Benign prostatic hyperplasia (BPH) with urinary urgency    Chronic kidney disease    KIDNEY STONES   COPD (chronic obstructive pulmonary disease) (HCC)    possible    COPD (chronic obstructive pulmonary disease) ([place])    Coronary artery disease    10/18 PCI/DES to pLAD, mild nonobstructive disease in the Lcx/RCA. Normal EF.    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   Dysphagia following cerebrovascular accident 02/17/2020   Dysarthria due to recent cerebral infarction 02/17/2020   Obesity 02/16/2020   Hyperbilirubinemia 02/16/2020   CVA (cerebral  vascular accident) ([place]) 02/15/2020   Thyroid mass 02/15/2020   Anxiety and depression 02/15/2020   Pulmonary emphysema ([place]) 06/02/2019   Aortic atherosclerosis ([place]) 06/02/2019   Smokers' cough ([place]) 06/02/2019   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   Multinodular goiter 12/24/2018   Subclinical hyperthyroidism 12/24/2018   BPH (benign prostatic hyperplasia) 11/20/2018   Pulmonary nodules 11/15/2018   Esophageal thickening 11/15/2018   Cigarette nicotine dependence without complication [id number]   Old MI (myocardial infarction) 01/30/2017   CAD (coronary artery disease) 01/30/2017   Hyperlipidemia 10/10/2016   History of non-ST elevation myocardial infarction (NSTEMI) 10/09/2016   Tobacco abuse 08/28/2016   Difficulty sleeping 04/22/2016   Benign prostatic hyperplasia with urinary frequency 02/11/2016    Past Surgical History:  Procedure Laterality Date   COLONOSCOPY     CORONARY ANGIOPLASTY  10/09/2016   CORONARY STENT INTERVENTION N/A 10/09/2016   Procedure: CORONARY STENT INTERVENTION;  Surgeon: Nelva Bush, MD;  Location: [facility] CV LAB;  Service: Cardiovascular;  Laterality: N/A;   CYSTOSCOPY WITH RETROGRADE PYELOGRAM, URETEROSCOPY AND STENT PLACEMENT Left 06/17/2019   Procedure: CYSTOSCOPY WITH RETROGRADE PYELOGRAM, URETEROSCOPY AND STENT PLACEMENT;  Surgeon: Robley Fries, MD;  Location: [facility];  Service: Urology;  Laterality: Left;  1 HR   HOLMIUM LASER APPLICATION Left [id number]  Procedure: HOLMIUM LASER APPLICATION;  Surgeon: Robley Fries, MD;  Location: [facility];  Service: Urology;  Laterality: Left;   INTRAVASCULAR ULTRASOUND/IVUS N/A 10/09/2016   Procedure: Intravascular Ultrasound/IVUS;  Surgeon: Nelva Bush, MD;  Location: [facility] CV LAB;  Service: Cardiovascular;  Laterality: N/A;   IR CHOLANGIOGRAM EXISTING TUBE  11/18/2018   IR PERC CHOLECYSTOSTOMY  11/16/2018   LEFT HEART CATH AND CORONARY ANGIOGRAPHY N/A 10/09/2016   Procedure: LEFT HEART  CATH AND CORONARY ANGIOGRAPHY;  Surgeon: Nelva Bush, MD;  Location: [facility] CV LAB;  Service: Cardiovascular;  Laterality: N/A;   LOOP RECORDER INSERTION N/A 02/20/2020   Procedure: LOOP RECORDER INSERTION;  Surgeon: Vickie Epley, MD;  Location: [facility];  Service: Cardiovascular;  Laterality: N/A;       Family History  Problem Relation Age of Onset   Alcohol abuse Father        called [facility] per pt   Cancer Father        [facility] PER PT NON SURGICAL    Colon cancer Neg Hx    Colon polyps Neg Hx    Esophageal cancer Neg Hx    Rectal cancer Neg Hx    Stomach cancer Neg Hx     Social History   Tobacco Use   Smoking status: Every Day    Packs/day: 2.00    Years: 45.00    Pack years: 90.00    Types: Cigarettes    Start date: 01/09/1961   Smokeless tobacco: Never   Tobacco comments:    tobacco info given  Vaping Use   Vaping Use: Never used  Substance Use Topics   Alcohol use: No   Drug use: No    Home Medications Prior to Admission medications   Medication Sig Start Date End Date Taking? Authorizing Provider  apixaban (ELIQUIS) 5 MG TABS tablet Take 1 tablet (5 mg total) by mouth 2 (two) times daily. 04/23/20   Evans Lance, MD  aspirin EC 81 MG tablet Take 1 tablet (81 mg total) by mouth daily. Swallow whole. Patient not taking: Reported on 08/12/2020 04/27/20   Vickie Epley, MD  atorvastatin (LIPITOR) 80 MG tablet Take 1 tablet (80 mg total) by mouth daily. 03/08/20   Angiulli, Lavon Paganini, PA-C  Baclofen 5 MG TABS Take 5 mg by mouth 4 (four) times daily. 03/31/20   Lovorn, Jinny Blossom, MD  citalopram (CELEXA) 10 MG tablet Take 1 tablet (10 mg total) by mouth daily. 03/31/20   Lovorn, Jinny Blossom, MD  cyclobenzaprine (FLEXERIL) 5 MG tablet Take 5 mg by mouth 3 (three) times daily as needed for muscle spasms.    [provider]  gabapentin (NEURONTIN) 100 MG capsule Take 100 mg by mouth daily as needed (pain).    [provider]  melatonin 3 MG TABS tablet Take 1 tablet (3 mg total) by mouth at bedtime. 03/08/20   Angiulli, Lavon Paganini, PA-C  methimazole (TAPAZOLE) 5 MG tablet TAKE 1 TABLET(5 MG) BY MOUTH DAILY Patient taking differently: Take 5 mg by mouth daily. 03/08/20   Angiulli, Lavon Paganini, PA-C  metoprolol succinate (TOPROL XL) 25 MG 24 hr tablet Take 1 tablet (25 mg total) by mouth daily. 04/23/20   Evans Lance, MD  tamsulosin (FLOMAX) 0.4 MG CAPS capsule Take 2 capsules (0.8 mg total) by mouth daily after supper. 03/08/20   Angiulli, Lavon Paganini, PA-C  traMADol (ULTRAM) 50 MG tablet Take 1 tablet (50 mg total) by  mouth every 12 (twelve) hours as needed for severe pain. 04/26/20   Frann Rider, NP  traZODone (DESYREL) 50 MG tablet Take 50 mg by mouth at bedtime.    [provider]    Allergies    Patient has no known allergies.  Review of Systems   Review of Systems  Neurological:  Positive for speech difficulty and numbness.  All other systems reviewed and are negative.  Physical Exam Updated Vital Signs BP 128/81   Pulse (!) 54   Temp 97.8 F (36.6 C) (Oral)   Resp 13   Ht '6\' 1"'$  (1.854 m)   Wt 99.8 kg   SpO2 91%   BMI 29.03 kg/m   Physical Exam Vitals and nursing note reviewed.  Constitutional:      Appearance: Normal appearance.  HENT:     Head: Normocephalic.     Nose: Nose normal.     Mouth/Throat:     Mouth: Mucous membranes are moist.  Eyes:     Extraocular Movements: Extraocular movements intact.     Pupils: Pupils are equal, round, and reactive to light.  Cardiovascular:     Rate and Rhythm: Normal rate and regular rhythm.     Pulses: Normal pulses.     Heart sounds: Normal heart sounds.  Pulmonary:     Effort: Pulmonary effort is normal.     Breath sounds: Normal breath sounds.  Abdominal:     General: Abdomen is flat.     Palpations: Abdomen is soft.  Musculoskeletal:        General: Normal range of motion.     Cervical back: Normal range of motion and neck  supple.  Skin:    General: Skin is warm.  Neurological:     General: No focal deficit present.     Mental Status: He is alert and oriented to person, place, and time.     Comments: Some expressive aphasia.  Patient has decreased sensation on left second finger.  However he has normal strength bilateral arms and legs.  Psychiatric:        Mood and Affect: Mood normal.        Behavior: Behavior normal.    ED Results / Procedures / Treatments   Labs (all labs ordered are listed, but only abnormal results are displayed) Labs Reviewed  CBC - Abnormal; Notable for the following components:      Result Value   RBC 5.98 (*)    All other components within normal limits  COMPREHENSIVE METABOLIC PANEL - Abnormal; Notable for the following components:   Glucose, Bld 100 (*)    All other components within normal limits  PROTIME-INR  APTT  DIFFERENTIAL  I-STAT CHEM 8, ED  CBG MONITORING, ED  TROPONIN I (HIGH SENSITIVITY)    EKG EKG Interpretation  Date/Time:  Thursday August 19 2020 12:36:34 EDT Ventricular Rate:  59 PR Interval:  180 QRS Duration: 112 QT Interval:  428 QTC Calculation: 423 R Axis:   -41 Text Interpretation: Sinus bradycardia Left axis deviation Incomplete right bundle branch block Cannot rule out Anterior infarct , age undetermined Abnormal ECG No significant change since last tracing Confirmed by Wandra Arthurs [phone]) on 08/19/2020 4:56:46 PM  Radiology CT HEAD WO CONTRAST  Result Date: 08/19/2020 CLINICAL DATA:  Neuro deficit, acute, stroke suspected. Additional history provided: Residual left-sided deficits from CVA March 2022, new onset headache and left-sided numbness. EXAM: CT HEAD WITHOUT CONTRAST TECHNIQUE: Contiguous axial images were obtained from the base of  the skull through the vertex without intravenous contrast. COMPARISON:  Prior brain MRI examinations 07/10/2020 and earlier. FINDINGS: Brain: Mild generalized cerebral atrophy. Redemonstrated, now late  subacute/chronic small cortically based infarct within the posterior right frontal lobe. Moderate/advanced patchy and confluent hypoattenuation within the cerebral white matter, nonspecific but compatible with chronic small vessel ischemic disease. Known small chronic infarcts within the bilateral cerebellar hemispheres were better appreciated on the prior MRI of 07/10/2020. There is no acute intracranial hemorrhage. No acute demarcated cortical infarct. No extra-axial fluid collection. No evidence of an intracranial mass. No midline shift. Vascular: No hyperdense vessel.  Atherosclerotic calcifications. Skull: Normal. Negative for fracture or focal lesion. Sinuses/Orbits: Visualized orbits show no acute finding. Trace mucosal thickening within the bilateral ethmoid and left maxillary sinuses. IMPRESSION: No evidence of acute intracranial abnormality. Known small late subacute/chronic cortically based infarct within the posterior right frontal lobe (acute at time of the prior brain MRI on 07/10/2020). Moderate/advanced chronic small vessel ischemic changes within the cerebral white matter. Mild generalized cerebral atrophy. Known tiny chronic infarcts within the bilateral cerebellar hemispheres. Electronically Signed   By: Kellie Simmering DO   On: 08/19/2020 14:06   MR BRAIN WO CONTRAST  Result Date: 08/19/2020 CLINICAL DATA:  Stroke, follow up recent stroke with weakness. EXAM: MRI HEAD WITHOUT CONTRAST TECHNIQUE: Multiplanar, multiecho pulse sequences of the brain and surrounding structures were obtained without intravenous contrast. COMPARISON:  Head CT 08/19/2020 and MRI 07/10/2020 FINDINGS: Brain: Only minimal residual diffusion weighted signal abnormality remains at the site of the subacute posterior right frontal infarct (acute on the prior MRI). No acute infarct, intracranial hemorrhage, mass, midline shift, or extra-axial fluid collection is identified. Patchy and confluent T2 hyperintensities in the  cerebral white matter bilaterally are unchanged and nonspecific but compatible with extensive chronic small vessel ischemic disease. There are chronic lacunar infarcts in the right basal ganglia, right internal capsule, and posterior right frontal white matter. Wallerian degeneration is noted along the right corticospinal tract. The right cerebellar hemisphere is asymmetrically small with an unchanged chronic lacunar infarct. There is mild-to-moderate cerebral atrophy. Vascular: Small and poorly visualized distal right vertebral artery. Other major intracranial vascular flow voids are preserved. Skull and upper cervical spine: Unremarkable bone marrow signal. Sinuses/Orbits: Bilateral cataract extraction. Paranasal sinuses and mastoid air cells are clear. Other: None. IMPRESSION: 1. No acute intracranial abnormality. 2. Small subacute posterior right frontal infarct. 3. Extensive chronic small vessel ischemic disease. Electronically Signed   By: Logan Bores M.D.   On: 08/19/2020 19:50   DG Chest Port 1 View  Result Date: 08/19/2020 CLINICAL DATA:  Chest pain EXAM: PORTABLE CHEST 1 VIEW COMPARISON:  07/10/2020 FINDINGS: Recording device over left chest. No focal opacity or pleural effusion. Normal cardiomediastinal silhouette. No pneumothorax IMPRESSION: No active disease. Electronically Signed   By: Donavan Foil M.D.   On: 08/19/2020 20:00    Procedures Procedures   Medications Ordered in ED Medications  sodium chloride flush (NS) 0.9 % injection 3 mL (3 mLs Intravenous Not Given 08/19/20 1703)    ED Course  I have reviewed the triage vital signs and the nursing notes.  Pertinent labs & imaging results that were available during my care of the patient were reviewed by me and considered in my medical decision making (see chart for details).    MDM Rules/Calculators/A&P                           David Severin  Hendricks is a 69 y.o. male here presenting with trouble speaking and left second finger numbness.   Patient stopped taking his trazodone and has trouble sleeping.  Patient had intermittent chest pain for the last several days as well.  Given that he recently had a stroke, considered for another stroke.  Chest pain is very atypical for ACS.  Plan to get MRI brain and labs and 1 set of troponin (pain for several days).   8:56 PM Labs unremarkable.  MRI brain showed subacute stroke.  Troponin negative x1.  At this point, patient stable for discharge.  Told him to take his trazodone as prescribed for sleep.   Final Clinical Impression(s) / ED Diagnoses Final diagnoses:  None    Rx / DC Orders ED Discharge Orders     None        Drenda Freeze, MD 08/19/20 2057

## 2020-08-19 NOTE — Discharge Instructions (Addendum)
You did not have a acute stroke on MRI right now.  Your heart enzyme is also normal right now  You should continue your Xarelto as prescribed   You need to follow-up with your neurologist this week  Return to ER if you have worse left arm numbness, weakness, trouble speaking, chest pain.

## 2020-08-19 NOTE — Social Work (Signed)
CSW coordinated transportation home via [employer]

## 2020-08-19 NOTE — ED Notes (Signed)
Pt transported to MRI at this time 

## 2020-08-24 ENCOUNTER — Encounter: Payer: Medicare Other | Admitting: Cardiology

## 2020-08-24 DIAGNOSIS — I639 Cerebral infarction, unspecified: Secondary | ICD-10-CM

## 2020-09-07 NOTE — Progress Notes (Signed)
[facility] Summary Report / Loop Recorder 

## 2020-09-14 ENCOUNTER — Ambulatory Visit (INDEPENDENT_AMBULATORY_CARE_PROVIDER_SITE_OTHER): Payer: Medicare Other

## 2020-09-14 DIAGNOSIS — I639 Cerebral infarction, unspecified: Secondary | ICD-10-CM

## 2020-09-14 LAB — CUP PACEART REMOTE DEVICE CHECK
Date Time Interrogation Session: 20220901161602
Implantable Pulse Generator Implant Date: 20220211

## 2020-09-16 DIAGNOSIS — M25519 Pain in unspecified shoulder: Secondary | ICD-10-CM | POA: Diagnosis not present

## 2020-09-16 DIAGNOSIS — G894 Chronic pain syndrome: Secondary | ICD-10-CM | POA: Diagnosis not present

## 2020-09-16 DIAGNOSIS — Z79891 Long term (current) use of opiate analgesic: Secondary | ICD-10-CM | POA: Diagnosis not present

## 2020-09-16 DIAGNOSIS — M542 Cervicalgia: Secondary | ICD-10-CM | POA: Diagnosis not present

## 2020-09-16 DIAGNOSIS — M5459 Other low back pain: Secondary | ICD-10-CM | POA: Diagnosis not present

## 2020-09-16 DIAGNOSIS — Z79899 Other long term (current) drug therapy: Secondary | ICD-10-CM | POA: Diagnosis not present

## 2020-09-22 NOTE — Progress Notes (Signed)
[facility] Summary Report / Loop Recorder 

## 2020-10-04 ENCOUNTER — Telehealth: Payer: Self-pay | Admitting: Adult Health

## 2020-10-04 NOTE — Telephone Encounter (Signed)
I called the pt back. He sts he is having difficulty keeping up with his appointments and referrals.  Pt reports he called the [facility] center and forgot what he was being referred there for. I reviewed with pt Janett Billow, NP had recommended the referral to f/u on depression post stroke.  Also reviewed with pt the need for establishing with a PCP. Pt reports he has been in touch with [facility] and left vm. Pt reports he will call behavioral health center back to schedule and also the senior care if he has not heard in a few days time.

## 2020-10-04 NOTE — Telephone Encounter (Signed)
Pt states he is having difficulty re: referrals being scheduled and is also having difficulty re: his medications, please call.

## 2020-10-04 NOTE — Telephone Encounter (Signed)
Pt called states he called to schedule an appt but that office said they are not accepting new pt's. Pt requesting a call back.

## 2020-10-04 NOTE — Telephone Encounter (Signed)
Contacted pt back, informed him Janett Billow said she would advise you to do some research for local psychologist and/or psychiatry that you would like to try to establish care with.  Majority of insurance plans do not require referrals to establish care with mental health. Pt stated [facility] is short staffed and cannot accept new pts. Pt will look for other locations and let us know

## 2020-10-04 NOTE — Telephone Encounter (Signed)
[facility] is not accepting new pts, any other advise ?

## 2020-10-04 NOTE — Telephone Encounter (Signed)
I would have him do some research for local psychologist and/or psychiatry that he would like to try to establish care with.  Majority of insurance plans do not require referrals to establish care with mental health.  Thank you.

## 2020-10-13 DIAGNOSIS — M542 Cervicalgia: Secondary | ICD-10-CM | POA: Diagnosis not present

## 2020-10-13 DIAGNOSIS — M25519 Pain in unspecified shoulder: Secondary | ICD-10-CM | POA: Diagnosis not present

## 2020-10-13 DIAGNOSIS — G894 Chronic pain syndrome: Secondary | ICD-10-CM | POA: Diagnosis not present

## 2020-10-13 DIAGNOSIS — M5459 Other low back pain: Secondary | ICD-10-CM | POA: Diagnosis not present

## 2020-10-14 ENCOUNTER — Ambulatory Visit
Admission: RE | Admit: 2020-10-14 | Discharge: 2020-10-14 | Disposition: A | Discharge status: Home / Self Care | Payer: Medicare Other | Source: Ambulatory Visit | Attending: Physician Assistant | Admitting: Physician Assistant

## 2020-10-14 ENCOUNTER — Other Ambulatory Visit: Payer: Self-pay | Admitting: Physician Assistant

## 2020-10-14 DIAGNOSIS — M542 Cervicalgia: Secondary | ICD-10-CM

## 2020-10-14 DIAGNOSIS — M5459 Other low back pain: Secondary | ICD-10-CM

## 2020-10-14 DIAGNOSIS — M545 Low back pain, unspecified: Secondary | ICD-10-CM | POA: Diagnosis not present

## 2020-10-14 LAB — CUP PACEART REMOTE DEVICE CHECK
Date Time Interrogation Session: 20221004161545
Implantable Pulse Generator Implant Date: 20220211

## 2020-10-18 ENCOUNTER — Ambulatory Visit (INDEPENDENT_AMBULATORY_CARE_PROVIDER_SITE_OTHER): Payer: Medicare Other

## 2020-10-18 ENCOUNTER — Other Ambulatory Visit: Payer: Self-pay | Admitting: Internal Medicine

## 2020-10-18 DIAGNOSIS — I639 Cerebral infarction, unspecified: Secondary | ICD-10-CM | POA: Diagnosis not present

## 2020-10-18 NOTE — Telephone Encounter (Signed)
Prescription refill request for Eliquis received. Indication: Afib  Last office visit: 04/27/20 Quentin Ore)  Scr: 0.96 (08/19/20) Age: 70 Weight: 99.8kg  Appropriate dose and refill sent to requested pharmacy.

## 2020-10-21 DIAGNOSIS — F331 Major depressive disorder, recurrent, moderate: Secondary | ICD-10-CM | POA: Diagnosis not present

## 2020-10-26 NOTE — Progress Notes (Signed)
[facility] Summary Report / Loop Recorder 

## 2020-10-28 DIAGNOSIS — M67813 Other specified disorders of tendon, right shoulder: Secondary | ICD-10-CM | POA: Diagnosis not present

## 2020-10-29 DIAGNOSIS — F331 Major depressive disorder, recurrent, moderate: Secondary | ICD-10-CM | POA: Diagnosis not present

## 2020-11-02 ENCOUNTER — Encounter: Payer: Self-pay | Admitting: Family Medicine

## 2020-11-02 ENCOUNTER — Other Ambulatory Visit: Payer: Self-pay

## 2020-11-02 ENCOUNTER — Ambulatory Visit (INDEPENDENT_AMBULATORY_CARE_PROVIDER_SITE_OTHER): Payer: Medicare Other | Admitting: Family Medicine

## 2020-11-02 VITALS — BP 110/64 | HR 87 | Temp 97.4°F | Ht 73.0 in | Wt 220.0 lb

## 2020-11-02 DIAGNOSIS — R350 Frequency of micturition: Secondary | ICD-10-CM

## 2020-11-02 DIAGNOSIS — Z1211 Encounter for screening for malignant neoplasm of colon: Secondary | ICD-10-CM

## 2020-11-02 DIAGNOSIS — N401 Enlarged prostate with lower urinary tract symptoms: Secondary | ICD-10-CM | POA: Diagnosis not present

## 2020-11-02 DIAGNOSIS — Z6830 Body mass index (BMI) 30.0-30.9, adult: Secondary | ICD-10-CM

## 2020-11-02 DIAGNOSIS — E6609 Other obesity due to excess calories: Secondary | ICD-10-CM | POA: Diagnosis not present

## 2020-11-02 DIAGNOSIS — E042 Nontoxic multinodular goiter: Secondary | ICD-10-CM | POA: Diagnosis not present

## 2020-11-02 DIAGNOSIS — G629 Polyneuropathy, unspecified: Secondary | ICD-10-CM

## 2020-11-02 DIAGNOSIS — F419 Anxiety disorder, unspecified: Secondary | ICD-10-CM

## 2020-11-02 DIAGNOSIS — I63511 Cerebral infarction due to unspecified occlusion or stenosis of right middle cerebral artery: Secondary | ICD-10-CM

## 2020-11-02 DIAGNOSIS — F32A Depression, unspecified: Secondary | ICD-10-CM

## 2020-11-02 MED ORDER — CITALOPRAM HYDROBROMIDE 10 MG PO TABS: 10.0000 mg | ORAL_TABLET | Freq: Every day | ORAL | 5 refills | Status: DC

## 2020-11-02 MED ORDER — CYANOCOBALAMIN 1000 MCG/ML IJ SOLN
1000.0000 ug | Freq: Once | INTRAMUSCULAR | Status: AC
  Administered 2020-11-02: 1000 ug via INTRAMUSCULAR

## 2020-11-02 MED ORDER — TAMSULOSIN HCL 0.4 MG PO CAPS: 0.8000 mg | ORAL_CAPSULE | Freq: Every day | ORAL | 0 refills | Status: DC

## 2020-11-02 NOTE — Progress Notes (Signed)
Provider:  Alain Honey, MD  Careteam: Patient Care Team: Wardell Honour, MD as PCP - General (Family Medicine) [name], MD as PCP - Cardiology (Cardiology) Vickie Epley, MD as PCP - Electrophysiology (Cardiology)  PLACE OF SERVICE:  [place] Directive information Does Patient Have a Medical Advance Directive?: No, Would patient like information on creating a medical advance directive?: No - Patient declined  No Known Allergies  Chief Complaint  Patient presents with   Medical Management of Chronic Issues    Patient here today to establish care.    Quality Metric Gaps    #3 covid, Fllu shot, Hep C screen, shingrix, Pneumonia 23     HPI: Patient is a 70 y.o. [facility] new patient here to establish care.  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  Residual involves some numbness and has left hand fingers and some clumsiness. He is on multiple medicines including Eliquis after the strokes, Lyrica trazodone for sleep baclofen and Flexeril for muscle relaxant metoprolol for blood pressure atorvastatin for lipids and Tapazole for thyroid and Flomax for nocturia. He has some chronic pains in his right shoulder and upper back for which he takes the Lyrica. He is also taking citalopram for depression 10 mg.  His psychologist recommended increasing to 20 mg and that seems appropriate. He reports getting B12 shots for some irritation in his right leg.  They seem to be helpful.  We talked about the indication for B12 but I do not think these injections will do any harm.  Review of Systems:  Review of Systems  Constitutional: Negative.   HENT: Negative.    Respiratory: Negative.    Cardiovascular: Negative.   Musculoskeletal:  Positive for back pain and neck pain.  Skin:  Positive for itching.       Right anterior thigh  Psychiatric/Behavioral: Negative.    All other systems reviewed and are negative.  Past Medical History:  Diagnosis Date   Allergy     Anxiety    Aortic atherosclerosis (HCC)    Arthritis    Benign prostatic hyperplasia (BPH) with urinary urgency    Chronic kidney disease    KIDNEY STONES   COPD (chronic obstructive pulmonary disease) (HCC)    possible    COPD (chronic obstructive pulmonary disease) ([place])    Coronary artery disease    10/18 PCI/DES to pLAD, mild nonobstructive disease in the Lcx/RCA. Normal EF.    Depression    Dyslipidemia    Dysrhythmia 2016   irregular heartbeat   Gallstones    GERD (gastroesophageal reflux disease)    History of kidney stones    Hyperlipidemia    Hyperthyroidism    Leg cramps    Multiple pulmonary nodules    Myocardial infarction ([place])    2018   Pneumonia    Seasonal allergies    Stroke [facility])    Thyroid nodule    Vertigo    Vitamin B 12 deficiency    Past Surgical History:  Procedure Laterality Date   COLONOSCOPY     CORONARY ANGIOPLASTY  10/09/2016   CORONARY STENT INTERVENTION N/A 10/09/2016   Procedure: CORONARY STENT INTERVENTION;  Surgeon: Nelva Bush, MD;  Location: [facility];  Service: Cardiovascular;  Laterality: N/A;   CYSTOSCOPY WITH RETROGRADE PYELOGRAM, URETEROSCOPY AND STENT PLACEMENT Left 06/17/2019   Procedure: CYSTOSCOPY WITH RETROGRADE PYELOGRAM, URETEROSCOPY AND STENT PLACEMENT;  Surgeon: Robley Fries, MD;  Location: [facility];  Service: Urology;  Laterality: Left;  1 HR   HOLMIUM LASER APPLICATION Left 05/11/8411   Procedure: HOLMIUM LASER APPLICATION;  Surgeon: Robley Fries, MD;  Location: [facility];  Service: Urology;  Laterality: Left;   INTRAVASCULAR ULTRASOUND/IVUS N/A 10/09/2016   Procedure: Intravascular Ultrasound/IVUS;  Surgeon: Nelva Bush, MD;  Location: [facility];  Service: Cardiovascular;  Laterality: N/A;   IR CHOLANGIOGRAM EXISTING TUBE  11/18/2018   IR PERC CHOLECYSTOSTOMY  11/16/2018   LEFT HEART CATH AND CORONARY ANGIOGRAPHY N/A 10/09/2016   Procedure: LEFT HEART CATH AND CORONARY ANGIOGRAPHY;  Surgeon:  Nelva Bush, MD;  Location: [facility];  Service: Cardiovascular;  Laterality: N/A;   LOOP RECORDER INSERTION N/A 02/20/2020   Procedure: LOOP RECORDER INSERTION;  Surgeon: Vickie Epley, MD;  Location: [facility];  Service: Cardiovascular;  Laterality: N/A;   Social History:   reports that he has been smoking cigarettes. He started smoking about David years ago. He has a 90.00 pack-year smoking history. He has never used smokeless tobacco. He reports that he does not drink alcohol and does not use drugs.  Family History  Problem Relation Age of Onset   Alcohol abuse Father        called [facility] per pt   Cancer Father        [facility] PER PT NON SURGICAL    Colon cancer Neg Hx    Colon polyps Neg Hx    Esophageal cancer Neg Hx    Rectal cancer Neg Hx    Stomach cancer Neg Hx     Medications: Patient's Medications  New Prescriptions   No medications on file  Previous Medications   ASPIRIN EC 81 MG TABLET    Take 1 tablet (81 mg total) by mouth daily. Swallow whole.   ATORVASTATIN (LIPITOR) 80 MG TABLET    Take 1 tablet (80 mg total) by mouth daily.   BACLOFEN 5 MG TABS    Take 5 mg by mouth 4 (four) times daily.   CITALOPRAM (CELEXA) 10 MG TABLET    Take 1 tablet (10 mg total) by mouth daily.   CYCLOBENZAPRINE (FLEXERIL) 5 MG TABLET    Take 5 mg by mouth 3 (three) times daily as needed for muscle spasms.   ELIQUIS 5 MG TABS TABLET    TAKE 1 TABLET(5 MG) BY MOUTH TWICE DAILY   GABAPENTIN (NEURONTIN) 100 MG CAPSULE    Take 100 mg by mouth daily as needed (pain).   MELATONIN 3 MG TABS TABLET    Take 1 tablet (3 mg total) by mouth at bedtime.   METHIMAZOLE (TAPAZOLE) 5 MG TABLET    TAKE 1 TABLET(5 MG) BY MOUTH DAILY   METOPROLOL SUCCINATE (TOPROL XL) 25 MG 24 HR TABLET    Take 1 tablet (25 mg total) by mouth daily.   PREGABALIN (LYRICA) 25 MG CAPSULE    Take 25 mg by mouth 3 (three) times daily.   PREGABALIN (LYRICA) 50 MG CAPSULE    Take 50 mg by  mouth 3 (three) times daily.   TAMSULOSIN (FLOMAX) 0.4 MG CAPS CAPSULE    Take 2 capsules (0.8 mg total) by mouth daily after supper.   TRAMADOL (ULTRAM) 50 MG TABLET    Take 1 tablet (50 mg total) by mouth every 12 (twelve) hours as needed for severe pain.   TRAZODONE (DESYREL) 50 MG TABLET    Take 50 mg by mouth at bedtime.  Modified Medications   No medications  on file  Discontinued Medications   No medications on file    Physical Exam:  Vitals:   11/02/20 1329  BP: 110/64  Pulse: 87  Temp: (!) 97.4 F (36.3 C)  SpO2: 94%  Weight: 220 lb (99.8 kg)  Height: 6\' 1"  (1.854 m)   Body mass index is 29.03 kg/m. Wt Readings from Last 3 Encounters:  11/02/20 220 lb (99.8 kg)  08/19/20 220 lb (99.8 kg)  08/12/20 219 lb (99.3 kg)    Physical Exam Vitals and nursing note reviewed.  Constitutional:      Appearance: Normal appearance.  HENT:     Nose: Nose normal.     Mouth/Throat:     Mouth: Mucous membranes are moist.  Eyes:     Pupils: Pupils are equal, round, and reactive to light.  Cardiovascular:     Rate and Rhythm: Normal rate and regular rhythm.  Pulmonary:     Effort: Pulmonary effort is normal.     Breath sounds: Normal breath sounds.  Abdominal:     General: Abdomen is flat.  Genitourinary:    Prostate: Normal.  Musculoskeletal:        General: Normal range of motion.     Cervical back: Normal range of motion.  Neurological:     General: No focal deficit present.     Mental Status: He is alert and oriented to person, place, and time.  Psychiatric:        Mood and Affect: Mood normal.        Behavior: Behavior normal.    Labs reviewed: Basic Metabolic Panel: Recent Labs    02/15/20 1518 02/16/20 0500 06/16/20 1730 07/10/20 0922 07/10/20 0930 08/19/20 1312 08/19/20 1314  NA  --    < > 141 138 139 140 137  K  --    < > 4.1 3.5 3.5 4.2 3.9  CL  --    < > 107 103 102 102 102  CO2  --    < > 27 26  --   --  26  GLUCOSE  --    < > 93 118* 116* 97  100*  BUN  --    < > 11 11 12 12 10   CREATININE  --    < > 0.97 0.98 0.90 0.80 0.96  CALCIUM  --    < > 8.7* 9.2  --   --  9.0  TSH 0.919  --   --   --   --   --   --    < > = values in this interval not displayed.   Liver Function Tests: Recent Labs    04/05/20 1220 07/10/20 0922 08/19/20 1314  AST 18 19 21   ALT 22 19 16   ALKPHOS 118 126 121  BILITOT 1.1 1.0 0.7  PROT 7.2 6.6 6.5  ALBUMIN 4.1 3.6 3.5   Recent Labs    04/05/20 1220  LIPASE 39   No results for input(s): AMMONIA in the last 8760 hours. CBC: Recent Labs    06/16/20 1730 07/10/20 0922 07/10/20 0930 08/19/20 1312 08/19/20 1314  WBC 7.7 10.2  --   --  8.0  NEUTROABS 4.8 6.8  --   --  5.2  HGB 15.0 17.0 17.0 17.0 16.9  HCT 45.2 51.7 50.0 50.0 51.5  MCV 87.4 87.5  --   --  86.1  PLT 137* 141*  --   --  154   Lipid Panel: Recent Labs    02/16/20  0500 08/12/20 1557  CHOL 168 108  HDL 41 37*  LDLCALC 111* 49  TRIG 80 124  CHOLHDL 4.1 2.9   TSH: Recent Labs    02/15/20 1518  TSH 0.919   A1C: Lab Results  Component Value Date   HGBA1C 5.4 08/12/2020     Assessment/Plan  1. Screen for colon cancer Patient had 1 colonoscopy attempted before but results were murky.  Does have some more recent constipation. - Ambulatory referral to Gastroenterology  2. Benign prostatic hyperplasia with urinary frequency Already taking  tamsulosin.  Increased from 0.4 2.8 mg/day - PSA, total and free  3. Neuropathy Unsure if B12 gives him placebo effect or there really is some neurologic issue but if he feels like this is effective I see no harm - cyanocobalamin ((VITAMIN B-12)) injection 1,000 mcg  4. Anxiety and depression Plan to increase citalopram from 10 to 20 mg  5. Multinodular goiter Continue with Tapazole as before  6. Class 1 obesity due to excess calories without serious comorbidity with body mass index (BMI) of 30.0 to 30.9 in adult Recommended calorie restriction and  exercise   Alain Honey, MD [facility] [phone]

## 2020-11-02 NOTE — Patient Instructions (Addendum)
Sarna lotion for rash/itching Increase citalopram to 20 mg Increase Tamsulosin to .8 mg Increase pregablin to 75 mg three times/Day

## 2020-11-03 ENCOUNTER — Other Ambulatory Visit: Payer: Self-pay | Admitting: Family Medicine

## 2020-11-03 DIAGNOSIS — F5101 Primary insomnia: Secondary | ICD-10-CM

## 2020-11-03 NOTE — Telephone Encounter (Signed)
Refill request received. Medication pended and sent to [place] for approval.

## 2020-11-05 ENCOUNTER — Telehealth: Payer: Self-pay

## 2020-11-05 DIAGNOSIS — F331 Major depressive disorder, recurrent, moderate: Secondary | ICD-10-CM | POA: Diagnosis not present

## 2020-11-05 NOTE — Telephone Encounter (Signed)
Patient called stating that Dr. Sabra Heck was going to increase his celexa from 10mg  to 20 mg, when patient picked up medication it was refilled with the same dosage. I see in last ov note that Dr. Sabra Heck states that he plans to increase dosage, but did not say he had increased it. If medication sent to pharmacy today send to [facility] Please advise.  Message routed to Marlowe Sax, NP

## 2020-11-12 ENCOUNTER — Other Ambulatory Visit: Payer: Self-pay | Admitting: *Deleted

## 2020-11-12 MED ORDER — CITALOPRAM HYDROBROMIDE 20 MG PO TABS: 20.0000 mg | ORAL_TABLET | Freq: Every day | ORAL | 1 refills | Status: DC

## 2020-11-12 NOTE — Telephone Encounter (Signed)
Patient called and stated that Dr. Sabra Heck was going to send in his Rx for Citalopram. Stated that he was going to increase it from 10mg  to 20mg .   Needs Rx sent to pharmacy.   I reviewed last OV note from Dr. Sabra Heck:  OV Note Dated 11/02/2020: He is also taking citalopram for depression 10 mg.  His psychologist recommended increasing to 20 mg and that seems appropriate. 4. Anxiety and depression Plan to increase citalopram from 10 to 20 mg    Rx sent to [facility] for approval due to [place]. Dr. Sabra Heck out of office.

## 2020-11-14 DIAGNOSIS — F331 Major depressive disorder, recurrent, moderate: Secondary | ICD-10-CM | POA: Diagnosis not present

## 2020-11-22 ENCOUNTER — Ambulatory Visit (INDEPENDENT_AMBULATORY_CARE_PROVIDER_SITE_OTHER): Payer: Medicare Other

## 2020-11-22 DIAGNOSIS — I639 Cerebral infarction, unspecified: Secondary | ICD-10-CM

## 2020-11-22 LAB — CUP PACEART REMOTE DEVICE CHECK
Date Time Interrogation Session: 20221106162027
Implantable Pulse Generator Implant Date: 20220211

## 2020-11-26 DIAGNOSIS — H353121 Nonexudative age-related macular degeneration, left eye, early dry stage: Secondary | ICD-10-CM | POA: Diagnosis not present

## 2020-11-29 NOTE — Progress Notes (Signed)
[facility] Summary Report / Loop Recorder 

## 2020-11-30 ENCOUNTER — Emergency Department (HOSPITAL_COMMUNITY)
Admission: EM | Admit: 2020-11-30 | Discharge: 2020-11-30 | Disposition: A | Discharge status: Home / Self Care | Payer: Medicare Other | Attending: Emergency Medicine | Admitting: Emergency Medicine

## 2020-11-30 ENCOUNTER — Ambulatory Visit: Payer: Medicare Other | Admitting: Family Medicine

## 2020-11-30 ENCOUNTER — Emergency Department (HOSPITAL_COMMUNITY): Payer: Medicare Other

## 2020-11-30 DIAGNOSIS — N189 Chronic kidney disease, unspecified: Secondary | ICD-10-CM | POA: Insufficient documentation

## 2020-11-30 DIAGNOSIS — W19XXXA Unspecified fall, initial encounter: Secondary | ICD-10-CM

## 2020-11-30 DIAGNOSIS — F331 Major depressive disorder, recurrent, moderate: Secondary | ICD-10-CM | POA: Diagnosis not present

## 2020-11-30 DIAGNOSIS — G8929 Other chronic pain: Secondary | ICD-10-CM | POA: Insufficient documentation

## 2020-11-30 DIAGNOSIS — R062 Wheezing: Secondary | ICD-10-CM | POA: Diagnosis not present

## 2020-11-30 DIAGNOSIS — R531 Weakness: Secondary | ICD-10-CM | POA: Diagnosis not present

## 2020-11-30 DIAGNOSIS — I2510 Atherosclerotic heart disease of native coronary artery without angina pectoris: Secondary | ICD-10-CM | POA: Diagnosis not present

## 2020-11-30 DIAGNOSIS — Z79899 Other long term (current) drug therapy: Secondary | ICD-10-CM | POA: Insufficient documentation

## 2020-11-30 DIAGNOSIS — M545 Low back pain, unspecified: Secondary | ICD-10-CM | POA: Diagnosis not present

## 2020-11-30 DIAGNOSIS — J449 Chronic obstructive pulmonary disease, unspecified: Secondary | ICD-10-CM | POA: Diagnosis not present

## 2020-11-30 DIAGNOSIS — R0902 Hypoxemia: Secondary | ICD-10-CM | POA: Diagnosis not present

## 2020-11-30 DIAGNOSIS — S0990XA Unspecified injury of head, initial encounter: Secondary | ICD-10-CM | POA: Diagnosis not present

## 2020-11-30 DIAGNOSIS — E041 Nontoxic single thyroid nodule: Secondary | ICD-10-CM | POA: Diagnosis not present

## 2020-11-30 DIAGNOSIS — I700 Atherosclerosis of aorta: Secondary | ICD-10-CM | POA: Diagnosis not present

## 2020-11-30 DIAGNOSIS — Z7982 Long term (current) use of aspirin: Secondary | ICD-10-CM | POA: Insufficient documentation

## 2020-11-30 DIAGNOSIS — S199XXA Unspecified injury of neck, initial encounter: Secondary | ICD-10-CM | POA: Diagnosis not present

## 2020-11-30 DIAGNOSIS — Z7901 Long term (current) use of anticoagulants: Secondary | ICD-10-CM | POA: Insufficient documentation

## 2020-11-30 DIAGNOSIS — J439 Emphysema, unspecified: Secondary | ICD-10-CM | POA: Diagnosis not present

## 2020-11-30 DIAGNOSIS — F17210 Nicotine dependence, cigarettes, uncomplicated: Secondary | ICD-10-CM | POA: Insufficient documentation

## 2020-11-30 DIAGNOSIS — Y92009 Unspecified place in unspecified non-institutional (private) residence as the place of occurrence of the external cause: Secondary | ICD-10-CM | POA: Diagnosis not present

## 2020-11-30 DIAGNOSIS — W06XXXA Fall from bed, initial encounter: Secondary | ICD-10-CM | POA: Diagnosis not present

## 2020-11-30 DIAGNOSIS — R9431 Abnormal electrocardiogram [ECG] [EKG]: Secondary | ICD-10-CM | POA: Diagnosis not present

## 2020-11-30 DIAGNOSIS — I959 Hypotension, unspecified: Secondary | ICD-10-CM | POA: Diagnosis not present

## 2020-11-30 LAB — BASIC METABOLIC PANEL
Anion gap: 8 (ref 5–15)
BUN: 15 mg/dL (ref 8–23)
CO2: 27 mmol/L (ref 22–32)
Calcium: 8.9 mg/dL (ref 8.9–10.3)
Chloride: 103 mmol/L (ref 98–111)
Creatinine, Ser: 1.04 mg/dL (ref 0.61–1.24)
GFR, Estimated: >60 mL/min (ref >60)
Glucose, Bld: 111 mg/dL — ABNORMAL HIGH (ref 70–99)
Potassium: 4.4 mmol/L (ref 3.5–5.1)
Sodium: 138 mmol/L (ref 135–145)

## 2020-11-30 LAB — CBC WITH DIFFERENTIAL/PLATELET
Abs Immature Granulocytes: 0.04 10*3/uL (ref 0.00–0.07)
Basophils Absolute: 0.1 10*3/uL (ref 0.0–0.1)
Basophils Relative: 1 %
Eosinophils Absolute: 0.3 10*3/uL (ref 0.0–0.5)
Eosinophils Relative: 2 %
HCT: 49.5 % (ref 39.0–52.0)
Hemoglobin: 16.5 g/dL (ref 13.0–17.0)
Immature Granulocytes: 0 %
Lymphocytes Relative: 26 %
Lymphs Abs: 2.9 10*3/uL (ref 0.7–4.0)
MCH: 28.5 pg (ref 26.0–34.0)
MCHC: 33.3 g/dL (ref 30.0–36.0)
MCV: 85.5 fL (ref 80.0–100.0)
Monocytes Absolute: 0.7 10*3/uL (ref 0.1–1.0)
Monocytes Relative: 6 %
Neutro Abs: 7.1 10*3/uL (ref 1.7–7.7)
Neutrophils Relative %: 65 %
Platelets: 155 10*3/uL (ref 150–400)
RBC: 5.79 MIL/uL (ref 4.22–5.81)
RDW: 13.9 % (ref 11.5–15.5)
WBC: 11 10*3/uL — ABNORMAL HIGH (ref 4.0–10.5)
nRBC: 0 % (ref 0.0–0.2)

## 2020-11-30 NOTE — ED Provider Notes (Signed)
[place] EMERGENCY DEPARTMENT Provider Note   CSN: [id number] Arrival date & time: 11/30/20  0030     History No chief complaint on file.   David Hendricks is a 70 y.o. male.  Patient presents to the emergency department with a chief complaint of fall.  He was brought in as a level 2 trauma for a fall on blood thinners.  He states that he has chronic low back pain, and as he was getting ready for bed tonight his back hurt, felt like it made his legs go weak causing him to fall.  He denies any LOC.  His only complaint is of his chronic back pain.  Denies any other injuries.  Denies new numbness, weakness, or tingling.  States that he also took trazodone prior to the fall as he was preparing to get to bed.-  The history is provided by the patient. No language interpreter was used.      Past Medical History:  Diagnosis Date   Allergy    Anxiety    Aortic atherosclerosis (HCC)    Arthritis    Benign prostatic hyperplasia (BPH) with urinary urgency    Chronic kidney disease    KIDNEY STONES   COPD (chronic obstructive pulmonary disease) (HCC)    possible    COPD (chronic obstructive pulmonary disease) ([place])    Coronary artery disease    10/18 PCI/DES to pLAD, mild nonobstructive disease in the Lcx/RCA. Normal EF.    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   Dysphagia following cerebrovascular accident 02/17/2020   Dysarthria due to recent cerebral infarction 02/17/2020   Obesity 02/16/2020    Hyperbilirubinemia 02/16/2020   CVA (cerebral vascular accident) ([place]) 02/15/2020   Thyroid mass 02/15/2020   Anxiety and depression 02/15/2020   Pulmonary emphysema ([place]) 06/02/2019   Aortic atherosclerosis ([place]) 06/02/2019   Smokers' cough ([place]) 06/02/2019   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   Multinodular goiter 12/24/2018   Subclinical hyperthyroidism 12/24/2018   BPH (benign prostatic hyperplasia) 11/20/2018   Pulmonary nodules 11/15/2018   Esophageal thickening 11/15/2018   Cigarette nicotine dependence without complication 40/81/4481   Old MI (myocardial infarction) 01/30/2017   CAD (coronary artery disease) 01/30/2017   Hyperlipidemia 10/10/2016   History of non-ST elevation myocardial infarction (NSTEMI) 10/09/2016   Tobacco abuse 08/28/2016   Difficulty sleeping 04/22/2016   Benign prostatic hyperplasia with urinary frequency 02/11/2016    Past Surgical History:  Procedure Laterality Date   COLONOSCOPY     CORONARY ANGIOPLASTY  10/09/2016   CORONARY STENT INTERVENTION N/A 10/09/2016   Procedure: CORONARY STENT INTERVENTION;  Surgeon: Nelva Bush, MD;  Location: [place] CV LAB;  Service: Cardiovascular;  Laterality: N/A;   CYSTOSCOPY WITH RETROGRADE PYELOGRAM, URETEROSCOPY AND STENT PLACEMENT Left 06/17/2019   Procedure: CYSTOSCOPY WITH RETROGRADE PYELOGRAM, URETEROSCOPY AND STENT PLACEMENT;  Surgeon: Robley Fries, MD;  [facility];  Service: Urology;  Laterality: Left;  1 HR   HOLMIUM LASER APPLICATION Left 02/11/252   Procedure: HOLMIUM LASER APPLICATION;  Surgeon: Robley Fries, MD;  Location: [facility];  Service: Urology;  Laterality: Left;   INTRAVASCULAR ULTRASOUND/IVUS N/A 10/09/2016   Procedure: Intravascular Ultrasound/IVUS;  Surgeon: Nelva Bush, MD;  Location: [place] CV LAB;  Service: Cardiovascular;  Laterality: N/A;   IR CHOLANGIOGRAM EXISTING TUBE  11/18/2018   IR PERC CHOLECYSTOSTOMY  11/16/2018   LEFT HEART CATH AND CORONARY  ANGIOGRAPHY N/A 10/09/2016   Procedure: LEFT HEART CATH AND CORONARY ANGIOGRAPHY;  Surgeon: Nelva Bush, MD;  Location: [place] CV LAB;  Service: Cardiovascular;  Laterality: N/A;   LOOP RECORDER INSERTION N/A 02/20/2020   Procedure: LOOP RECORDER INSERTION;  Surgeon: Vickie Epley, MD;  Location: [place] CV LAB;  Service: Cardiovascular;  Laterality: N/A;       Family History  Problem Relation Age of Onset   Alcohol abuse Father        called [facility] Cancer per pt   Cancer Father        OAK CELL CANCER PER PT NON SURGICAL    Colon cancer Neg Hx    Colon polyps Neg Hx    Esophageal cancer Neg Hx    Rectal cancer Neg Hx    Stomach cancer Neg Hx     Social History   Tobacco Use   Smoking status: Every Day    Packs/day: 2.00    Years: 45.00    Pack years: 90.00    Types: Cigarettes    Start date: 01/09/1961   Smokeless tobacco: Never   Tobacco comments:    tobacco info given  Vaping Use   Vaping Use: Never used  Substance Use Topics   Alcohol use: No   Drug use: No    Home Medications Prior to Admission medications   Medication Sig Start Date End Date Taking? Authorizing Provider  aspirin EC 81 MG tablet Take 1 tablet (81 mg total) by mouth daily. Swallow whole. 04/27/20   Vickie Epley, MD  atorvastatin (LIPITOR) 80 MG tablet Take 1 tablet (80 mg total) by mouth daily. 03/08/20   Angiulli, Lavon Paganini, PA-C  Baclofen 5 MG TABS Take 5 mg by mouth 4 (four) times daily. 03/31/20   Lovorn, Jinny Blossom, MD  citalopram (CELEXA) 20 MG tablet Take 1 tablet (20 mg total) by mouth daily. 11/12/20   Ngetich, Dinah C, NP  cyclobenzaprine (FLEXERIL) 5 MG tablet Take 5 mg by mouth 3 (three) times daily as needed for muscle spasms.    [provider]  ELIQUIS 5 MG TABS tablet TAKE 1 TABLET(5 MG) BY MOUTH TWICE DAILY 10/18/20   Vickie Epley, MD  gabapentin (NEURONTIN) 100 MG capsule Take 100 mg by mouth daily as needed (pain).    [provider]   melatonin 3 MG TABS tablet Take 1 tablet (3 mg total) by mouth at bedtime. 03/08/20   Angiulli, Lavon Paganini, PA-C  methimazole (TAPAZOLE) 5 MG tablet TAKE 1 TABLET(5 MG) BY MOUTH DAILY Patient taking differently: Take 5 mg by mouth daily. 03/08/20   Angiulli, Lavon Paganini, PA-C  metoprolol succinate (TOPROL XL) 25 MG 24 hr tablet Take 1 tablet (25 mg total) by mouth daily. 04/23/20   Evans Lance, MD  pregabalin (LYRICA) 25 MG capsule Take 25 mg by mouth 3 (three) times daily.    [provider]  pregabalin (LYRICA) 50 MG capsule Take 50 mg by  mouth 3 (three) times daily.    [provider]  tamsulosin (FLOMAX) 0.4 MG CAPS capsule Take 2 capsules (0.8 mg total) by mouth daily after supper. 11/02/20   Wardell Honour, MD  traZODone (DESYREL) 100 MG tablet TAKE 1 TABLET BY MOUTH AT BEDTIME 11/09/20   Wardell Honour, MD    Allergies    Patient has no known allergies.  Review of Systems   Review of Systems  All other systems reviewed and are negative.  Physical Exam Updated Vital Signs BP 110/64   Pulse 63   Temp 97.6 F (36.4 C) (Oral)   Resp 13   Ht 6\' 1"  (1.854 m)   Wt 98.4 kg   SpO2 96%   BMI 28.63 kg/m   Physical Exam Vitals and nursing note reviewed.  Constitutional:      General: He is not in acute distress.    Appearance: He is well-developed.  HENT:     Head: Normocephalic and atraumatic.  Eyes:     Conjunctiva/sclera: Conjunctivae normal.  Cardiovascular:     Rate and Rhythm: Normal rate and regular rhythm.     Heart sounds: No murmur heard. Pulmonary:     Effort: Pulmonary effort is normal. No respiratory distress.     Breath sounds: Normal breath sounds.  Abdominal:     Palpations: Abdomen is soft.     Tenderness: There is no abdominal tenderness.  Musculoskeletal:        General: No swelling. Normal range of motion.     Cervical back: Neck supple.  Skin:    General: Skin is warm and dry.     Capillary Refill: Capillary refill takes less  than 2 seconds.  Neurological:     Mental Status: He is alert.  Psychiatric:        Mood and Affect: Mood normal.    ED Results / Procedures / Treatments   Labs (all labs ordered are listed, but only abnormal results are displayed) Labs Reviewed  CBC WITH DIFFERENTIAL/PLATELET - Abnormal; Notable for the following components:      Result Value   WBC 11.0 (*)    All other components within normal limits  BASIC METABOLIC PANEL  CBG MONITORING, ED    EKG None  Radiology CT HEAD WO CONTRAST (5MM)  Result Date: 11/30/2020 CLINICAL DATA:  Trauma. EXAM: CT HEAD WITHOUT CONTRAST CT CERVICAL SPINE WITHOUT CONTRAST TECHNIQUE: Multidetector CT imaging of the head and cervical spine was performed following the standard protocol without intravenous contrast. Multiplanar CT image reconstructions of the cervical spine were also generated. COMPARISON:  None. FINDINGS: CT HEAD FINDINGS Brain: Moderate age-related atrophy and chronic microvascular ischemic changes. There is no acute intracranial hemorrhage. No mass effect or midline shift. No extra-axial fluid collection. Vascular: No hyperdense vessel or unexpected calcification. Skull: Normal. Negative for fracture or focal lesion. Sinuses/Orbits: No acute finding. Other: None CT CERVICAL SPINE FINDINGS Alignment: No acute subluxation. Skull base and vertebrae: No acute fracture Soft tissues and spinal canal: No prevertebral fluid or swelling. No visible canal hematoma. Disc levels:  No acute findings Upper chest: Emphysema. Other: Large left thyroid nodule, partially visualized. This measures up to 6.5 cm in AP diameter. Recommend thyroid US (ref: J Am Coll Radiol. 2015 Feb;12(2): 143-50). IMPRESSION: 1. No acute intracranial pathology. Moderate age-related atrophy and chronic microvascular ischemic changes. 2. No acute/traumatic cervical spine pathology. 3. Large left thyroid nodule. Electronically Signed   By: Anner Crete M.D.   On: 11/30/2020  01:19   CT Cervical Spine Wo Contrast  Result Date: 11/30/2020 CLINICAL DATA:  Trauma. EXAM: CT HEAD WITHOUT CONTRAST CT CERVICAL SPINE WITHOUT CONTRAST TECHNIQUE: Multidetector CT imaging of the head and cervical spine was performed following the standard protocol without intravenous contrast. Multiplanar CT image reconstructions of the cervical spine were also generated. COMPARISON:  None. FINDINGS: CT HEAD FINDINGS Brain: Moderate age-related atrophy and chronic microvascular ischemic changes. There is no acute intracranial hemorrhage. No mass effect or midline shift. No extra-axial fluid collection. Vascular: No hyperdense vessel or unexpected calcification. Skull: Normal. Negative for fracture or focal lesion. Sinuses/Orbits: No acute finding. Other: None CT CERVICAL SPINE FINDINGS Alignment: No acute subluxation. Skull base and vertebrae: No acute fracture Soft tissues and spinal canal: No prevertebral fluid or swelling. No visible canal hematoma. Disc levels:  No acute findings Upper chest: Emphysema. Other: Large left thyroid nodule, partially visualized. This measures up to 6.5 cm in AP diameter. Recommend thyroid US (ref: J Am Coll Radiol. 2015 Feb;12(2): 143-50). IMPRESSION: 1. No acute intracranial pathology. Moderate age-related atrophy and chronic microvascular ischemic changes. 2. No acute/traumatic cervical spine pathology. 3. Large left thyroid nodule. Electronically Signed   By: Anner Crete M.D.   On: 11/30/2020 01:19   CT Lumbar Spine Wo Contrast  Result Date: 11/30/2020 CLINICAL DATA:  Low back pain, fall from standing EXAM: CT LUMBAR SPINE WITHOUT CONTRAST TECHNIQUE: Multidetector CT imaging of the lumbar spine was performed without intravenous contrast administration. Multiplanar CT image reconstructions were also generated. COMPARISON:  10/14/2020 lumbar spine radiographs, correlation is also made with 04/05/2020 CT abdomen pelvis. FINDINGS: Segmentation: 5 lumbar type  vertebrae. Alignment: Mild retrolisthesis L5 on S1, unchanged compared to 04/05/2020. Vertebrae: No acute fracture or suspicious osseous lesion. Mild endplate degenerative changes, which appears similar to 04/05/2020. Paraspinal and other soft tissues: Nonobstructing renal calculi. Aortic atherosclerosis. Disc levels: Multilevel mild disc bulges and facet arthropathy, without significant spinal canal stenosis. Mild-to-moderate bilateral neural foraminal narrowing at L5-S1. IMPRESSION: 1.  No acute fracture or traumatic listhesis in the lumbar spine. 2. Unchanged retrolisthesis of L5 on S1, which was present on 04/05/2020, and results in mild-to-moderate bilateral neural foraminal narrowing. Electronically Signed   By: Merilyn Baba M.D.   On: 11/30/2020 01:28   DG Chest Portable 1 View  Result Date: 11/30/2020 EXAM: PORTABLE CHEST 1 VIEW COMPARISON:  Chest radiograph dated 08/19/2020. FINDINGS: No focal consolidation, pleural effusion or pneumothorax. The cardiac silhouette is within limits. Atherosclerotic calcification of the aorta. No acute osseous pathology. Loop recorder device. IMPRESSION: No active disease. Electronically Signed   By: Anner Crete M.D.   On: 11/30/2020 00:47    Procedures Procedures   Medications Ordered in ED Medications - No data to display  ED Course  I have reviewed the triage vital signs and the nursing notes.  Pertinent labs & imaging results that were available during my care of the patient were reviewed by me and considered in my medical decision making (see chart for details).    MDM Rules/Calculators/A&P                           Patient brought in as a level 2 trauma for fall on blood thinners.  No obvious traumatic injuries.  Patient complains of chronic low back pain.  He thinks this contributed to his fall.  CT head and neck are negative for traumatic findings.  CT of the lumbar spine shows unchanged retrolisthesis of  L5-S1.  Patient is able to  ambulate about the room.  His vital signs are stable.  Patient seen by discussed with Dr. Wyvonnia Dusky, who agrees with plan for discharge.  Recommend PCP follow-up. Final Clinical Impression(s) / ED Diagnoses Final diagnoses:  Fall, initial encounter  Chronic midline low back pain, unspecified whether sciatica present    Rx / DC Orders ED Discharge Orders     None        Montine Circle, PA-C 11/30/20 0244    Ezequiel Essex, MD 11/30/20 [phone]

## 2020-11-30 NOTE — ED Notes (Signed)
DC instructions reviewed with pt. PT verbalized understanding. PT DC °

## 2020-11-30 NOTE — Discharge Instructions (Signed)
The CT scan of your head and neck showed no traumatic injuries.  The CT scan of your lumbar spine showed no traumatic injuries.  We recommend that you follow-up with your doctor for your ongoing back pain.

## 2020-11-30 NOTE — ED Notes (Signed)
PT ambulated independently in around the bed

## 2020-11-30 NOTE — Progress Notes (Signed)
Orthopedic Tech Progress Note Patient Details:  David Hendricks September 18, 1950 [id number]  Patient ID: David Hendricks, male   DOB: 12-30-1950, 70 y.o.   MRN: [id number] I attended trauma page [facility] 11/30/2020, 4:59 AM

## 2020-11-30 NOTE — ED Notes (Signed)
Trauma Response Nurse Note-  Reason for Call / Reason for Trauma activation:   - Level 2, fall on blood thinner eliquis  Initial Focused Assessment (If applicable, or please see trauma documentation):  - Pt alert and oriented, complaining of lower chronic back pain.   Interventions:  - Blood work, portable chest x-ray, CT: head, c-spine and l-spine.   Plan of Care as of this note:  - Waiting on Imaging services  Event Summary:   - Pt came in as a level 2 trauma. Pt stated he took a sleeping med and was trying to walk and he fell. Pt states chronic lower back pain. Pt alert and oriented. Pt states he hit his forehead area when he fell. No external hemorrhage noted. Pt taken to CT and back on monitor with TRN. Pt currently in room with primary RN.

## 2020-11-30 NOTE — ED Triage Notes (Signed)
Pt BIB [facility] for a fall on Eliquis from a standing position while going to bed. Pt has chronic back pain that was bothering him enough tonight that his legs went out causing the fall.  Pt states he hit head on carpet.  PT complains of neck and back pain.   Pt took trazadone for his night time medicine.  Pt is a smoker. EMS reports 02 sats of 89% so they placed him on 2L. He was 98% on 2L. He is A&Ox4.  EMS attempted c-collar but pt could not tolerate it.

## 2020-12-01 ENCOUNTER — Other Ambulatory Visit: Payer: Self-pay

## 2020-12-01 ENCOUNTER — Encounter: Payer: Self-pay | Admitting: Family Medicine

## 2020-12-01 ENCOUNTER — Ambulatory Visit (INDEPENDENT_AMBULATORY_CARE_PROVIDER_SITE_OTHER): Payer: Medicare Other | Admitting: Family Medicine

## 2020-12-01 VITALS — BP 128/64 | HR 88 | Temp 97.7°F | Ht 73.0 in | Wt 222.2 lb

## 2020-12-01 DIAGNOSIS — I69398 Other sequelae of cerebral infarction: Secondary | ICD-10-CM

## 2020-12-01 DIAGNOSIS — I63511 Cerebral infarction due to unspecified occlusion or stenosis of right middle cerebral artery: Secondary | ICD-10-CM

## 2020-12-01 DIAGNOSIS — F32A Depression, unspecified: Secondary | ICD-10-CM

## 2020-12-01 DIAGNOSIS — R269 Unspecified abnormalities of gait and mobility: Secondary | ICD-10-CM

## 2020-12-01 DIAGNOSIS — F419 Anxiety disorder, unspecified: Secondary | ICD-10-CM

## 2020-12-01 DIAGNOSIS — E7849 Other hyperlipidemia: Secondary | ICD-10-CM | POA: Diagnosis not present

## 2020-12-01 MED ORDER — CYANOCOBALAMIN 1000 MCG/ML IJ SOLN
1000.0000 ug | Freq: Once | INTRAMUSCULAR | Status: AC
  Administered 2020-12-01: 1000 ug via INTRAMUSCULAR

## 2020-12-01 NOTE — Progress Notes (Signed)
Provider:  Alain Honey, MD  Careteam: Patient Care Team: Wardell Honour, MD as PCP - General (Family Medicine) [name], MD as PCP - Cardiology (Cardiology) Vickie Epley, MD as PCP - Electrophysiology (Cardiology)  PLACE OF SERVICE:  [place] Directive information    No Known Allergies  Chief Complaint  Patient presents with   Medical Management of Chronic Issues    Patient presents today for follow-up appointment.   Quality Metric Gaps    Hep C screening, zoster, TDAP, pneumonia, Flu COVID booster     HPI: Patient is a 70 y.o. male.  He is requesting repeat B12 shot today for nerve irritation in his right leg.  He has had several strokes and has extremity numbness and clumsiness likely related to the strokes.  He had a fall within the last few days and was evaluated in the emergency room.  There CT scans of his head neck and low back showed no acute changes. He also has a bump on his right upper lip and lower lip that bleed sometimes when he shaves.  Review of Systems:  Review of Systems  Musculoskeletal:  Positive for back pain and falls.  All other systems reviewed and are negative.  Past Medical History:  Diagnosis Date   Allergy    Anxiety    Aortic atherosclerosis (HCC)    Arthritis    Benign prostatic hyperplasia (BPH) with urinary urgency    Chronic kidney disease    KIDNEY STONES   COPD (chronic obstructive pulmonary disease) (HCC)    possible    COPD (chronic obstructive pulmonary disease) ([place])    Coronary artery disease    10/18 PCI/DES to pLAD, mild nonobstructive disease in the Lcx/RCA. Normal EF.    Depression    Dyslipidemia    Dysrhythmia 2016   irregular heartbeat   Gallstones    GERD (gastroesophageal reflux disease)    History of kidney stones    Hyperlipidemia    Hyperthyroidism    Leg cramps    Multiple pulmonary nodules    Myocardial infarction ([place])    2018   Pneumonia    Seasonal allergies     Stroke [facility])    Thyroid nodule    Vertigo    Vitamin B 12 deficiency    Past Surgical History:  Procedure Laterality Date   COLONOSCOPY     CORONARY ANGIOPLASTY  10/09/2016   CORONARY STENT INTERVENTION N/A 10/09/2016   Procedure: CORONARY STENT INTERVENTION;  Surgeon: Nelva Bush, MD;  Location: [place] CV LAB;  Service: Cardiovascular;  Laterality: N/A;   CYSTOSCOPY WITH RETROGRADE PYELOGRAM, URETEROSCOPY AND STENT PLACEMENT Left 06/17/2019   Procedure: CYSTOSCOPY WITH RETROGRADE PYELOGRAM, URETEROSCOPY AND STENT PLACEMENT;  Surgeon: Robley Fries, MD;  [facility];  Service: Urology;  Laterality: Left;  1 HR   HOLMIUM LASER APPLICATION Left 02/14/4268   Procedure: HOLMIUM LASER APPLICATION;  Surgeon: Robley Fries, MD;  [facility];  Service: Urology;  Laterality: Left;   INTRAVASCULAR ULTRASOUND/IVUS N/A 10/09/2016   Procedure: Intravascular Ultrasound/IVUS;  Surgeon: Nelva Bush, MD;  Location: [place] CV LAB;  Service: Cardiovascular;  Laterality: N/A;   IR CHOLANGIOGRAM EXISTING TUBE  11/18/2018   IR PERC CHOLECYSTOSTOMY  11/16/2018   LEFT HEART CATH AND CORONARY ANGIOGRAPHY N/A 10/09/2016   Procedure: LEFT HEART CATH AND CORONARY ANGIOGRAPHY;  Surgeon: Nelva Bush, MD;  Location: [place] [facility];  Service: Cardiovascular;  Laterality: N/A;   LOOP  RECORDER INSERTION N/A 02/20/2020   Procedure: LOOP RECORDER INSERTION;  Surgeon: Vickie Epley, MD;  Location: [place] CV LAB;  Service: Cardiovascular;  Laterality: N/A;   Social History:   reports that he has been smoking cigarettes. He started smoking about 59 years ago. He has a 90.00 pack-year smoking history. He has never used smokeless tobacco. He reports that he does not drink alcohol and does not use drugs.  Family History  Problem Relation Age of Onset   Alcohol abuse Father        called [facility] Cancer per pt   Cancer Father        OAK CELL CANCER PER PT NON SURGICAL    Colon  cancer Neg Hx    Colon polyps Neg Hx    Esophageal cancer Neg Hx    Rectal cancer Neg Hx    Stomach cancer Neg Hx     Medications: Patient's Medications  New Prescriptions   No medications on file  Previous Medications   ASPIRIN EC 81 MG TABLET    Take 1 tablet (81 mg total) by mouth daily. Swallow whole.   ATORVASTATIN (LIPITOR) 80 MG TABLET    Take 1 tablet (80 mg total) by mouth daily.   BACLOFEN 5 MG TABS    Take 5 mg by mouth 4 (four) times daily.   CITALOPRAM (CELEXA) 20 MG TABLET    Take 1 tablet (20 mg total) by mouth daily.   CYCLOBENZAPRINE (FLEXERIL) 5 MG TABLET    Take 5 mg by mouth 3 (three) times daily as needed for muscle spasms.   ELIQUIS 5 MG TABS TABLET    TAKE 1 TABLET(5 MG) BY MOUTH TWICE DAILY   GABAPENTIN (NEURONTIN) 100 MG CAPSULE    Take 100 mg by mouth daily as needed (pain).   MELATONIN 3 MG TABS TABLET    Take 1 tablet (3 mg total) by mouth at bedtime.   METHIMAZOLE (TAPAZOLE) 5 MG TABLET    TAKE 1 TABLET(5 MG) BY MOUTH DAILY   METOPROLOL SUCCINATE (TOPROL XL) 25 MG 24 HR TABLET    Take 1 tablet (25 mg total) by mouth daily.   PREGABALIN (LYRICA) 25 MG CAPSULE    Take 25 mg by mouth 3 (three) times daily.   PREGABALIN (LYRICA) 50 MG CAPSULE    Take 50 mg by mouth 3 (three) times daily.   TAMSULOSIN (FLOMAX) 0.4 MG CAPS CAPSULE    Take 2 capsules (0.8 mg total) by mouth daily after supper.   TRAZODONE (DESYREL) 100 MG TABLET    TAKE 1 TABLET BY MOUTH AT BEDTIME  Modified Medications   No medications on file  Discontinued Medications   No medications on file    Physical Exam:  Vitals:   12/01/20 1048  BP: 128/64  Pulse: 88  Temp: 97.7 F (36.5 C)  SpO2: 95%  Weight: 222 lb 3.2 oz (100.8 kg)  Height: 6\' 1"  (1.854 m)   Body mass index is 29.32 kg/m. Wt Readings from Last 3 Encounters:  12/01/20 222 lb 3.2 oz (100.8 kg)  11/30/20 217 lb (98.4 kg)  11/02/20 220 lb (99.8 kg)    Physical Exam Vitals and nursing note reviewed.  Constitutional:       Appearance: Normal appearance.  HENT:     Mouth/Throat:     Comments: Areas he questions appear to be some scar tissue. Cardiovascular:     Rate and Rhythm: Normal rate.  Pulmonary:     Effort:  Pulmonary effort is normal.  Neurological:     General: No focal deficit present.     Mental Status: He is alert and oriented to person, place, and time.     Gait: Gait normal.     Comments: Normal tandem gait in negative Romberg    Labs reviewed: Basic Metabolic Panel: Recent Labs    02/15/20 1518 02/16/20 0500 07/10/20 0922 07/10/20 0930 08/19/20 1312 08/19/20 1314 11/30/20 0038  NA  --    < > 138   < > 140 137 138  K  --    < > 3.5   < > 4.2 3.9 4.4  CL  --    < > 103   < > 102 102 103  CO2  --    < > 26  --   --  26 27  GLUCOSE  --    < > 118*   < > 97 100* 111*  BUN  --    < > 11   < > 12 10 15   CREATININE  --    < > 0.98   < > 0.80 0.96 1.04  CALCIUM  --    < > 9.2  --   --  9.0 8.9  TSH 0.919  --   --   --   --   --   --    < > = values in this interval not displayed.   Liver Function Tests: Recent Labs    04/05/20 1220 07/10/20 0922 08/19/20 1314  AST 18 19 21   ALT 22 19 16   ALKPHOS 118 126 121  BILITOT 1.1 1.0 0.7  PROT 7.2 6.6 6.5  ALBUMIN 4.1 3.6 3.5   Recent Labs    04/05/20 1220  LIPASE 39   No results for input(s): AMMONIA in the last 8760 hours. CBC: Recent Labs    07/10/20 0922 07/10/20 0930 08/19/20 1312 08/19/20 1314 11/30/20 0038  WBC 10.2  --   --  8.0 11.0*  NEUTROABS 6.8  --   --  5.2 7.1  HGB 17.0   < > 17.0 16.9 16.5  HCT 51.7   < > 50.0 51.5 49.5  MCV 87.5  --   --  86.1 85.5  PLT 141*  --   --  154 155   < > = values in this interval not displayed.   Lipid Panel: Recent Labs    02/16/20 0500 08/12/20 1557  CHOL 168 108  HDL 41 37*  LDLCALC 111* 49  TRIG 80 124  CHOLHDL 4.1 2.9   TSH: Recent Labs    02/15/20 1518  TSH 0.919   A1C: Lab Results  Component Value Date   HGBA1C 5.4 08/12/2020      Assessment/Plan  1. Gait disturbance, post-stroke Think this is probably  late effect of stroke.  - cyanocobalamin ((VITAMIN B-12)) injection 1,000 mcg  2. Anxiety and depression Taking celexa and Trazodone  3. Other hyperlipidemia Continues ator and lipids are at goal    Alain Honey, MD [place] [phone]

## 2020-12-07 DIAGNOSIS — F331 Major depressive disorder, recurrent, moderate: Secondary | ICD-10-CM | POA: Diagnosis not present

## 2020-12-14 DIAGNOSIS — F331 Major depressive disorder, recurrent, moderate: Secondary | ICD-10-CM | POA: Diagnosis not present

## 2020-12-23 ENCOUNTER — Ambulatory Visit: Payer: Medicare Other | Admitting: Adult Health

## 2020-12-24 DIAGNOSIS — F331 Major depressive disorder, recurrent, moderate: Secondary | ICD-10-CM | POA: Diagnosis not present

## 2020-12-27 ENCOUNTER — Ambulatory Visit (INDEPENDENT_AMBULATORY_CARE_PROVIDER_SITE_OTHER): Payer: Medicare Other

## 2020-12-27 DIAGNOSIS — I639 Cerebral infarction, unspecified: Secondary | ICD-10-CM | POA: Diagnosis not present

## 2020-12-27 LAB — CUP PACEART REMOTE DEVICE CHECK
Date Time Interrogation Session: 20221217231130
Implantable Pulse Generator Implant Date: 20220211

## 2020-12-28 DIAGNOSIS — M542 Cervicalgia: Secondary | ICD-10-CM | POA: Diagnosis not present

## 2020-12-28 DIAGNOSIS — M25519 Pain in unspecified shoulder: Secondary | ICD-10-CM | POA: Diagnosis not present

## 2020-12-28 DIAGNOSIS — G894 Chronic pain syndrome: Secondary | ICD-10-CM | POA: Diagnosis not present

## 2020-12-28 DIAGNOSIS — Z8673 Personal history of transient ischemic attack (TIA), and cerebral infarction without residual deficits: Secondary | ICD-10-CM | POA: Diagnosis not present

## 2020-12-28 DIAGNOSIS — M47817 Spondylosis without myelopathy or radiculopathy, lumbosacral region: Secondary | ICD-10-CM | POA: Diagnosis not present

## 2020-12-31 ENCOUNTER — Other Ambulatory Visit: Payer: Self-pay

## 2020-12-31 ENCOUNTER — Emergency Department (HOSPITAL_COMMUNITY): Payer: Medicare Other

## 2020-12-31 ENCOUNTER — Emergency Department (HOSPITAL_COMMUNITY)
Admission: EM | Admit: 2020-12-31 | Discharge: 2020-12-31 | Disposition: A | Discharge status: Home / Self Care | Payer: Medicare Other | Attending: Emergency Medicine | Admitting: Emergency Medicine

## 2020-12-31 DIAGNOSIS — J449 Chronic obstructive pulmonary disease, unspecified: Secondary | ICD-10-CM | POA: Diagnosis not present

## 2020-12-31 DIAGNOSIS — R531 Weakness: Secondary | ICD-10-CM

## 2020-12-31 DIAGNOSIS — Z7982 Long term (current) use of aspirin: Secondary | ICD-10-CM | POA: Insufficient documentation

## 2020-12-31 DIAGNOSIS — I499 Cardiac arrhythmia, unspecified: Secondary | ICD-10-CM | POA: Diagnosis not present

## 2020-12-31 DIAGNOSIS — R0602 Shortness of breath: Secondary | ICD-10-CM | POA: Diagnosis not present

## 2020-12-31 DIAGNOSIS — F17210 Nicotine dependence, cigarettes, uncomplicated: Secondary | ICD-10-CM | POA: Insufficient documentation

## 2020-12-31 DIAGNOSIS — R112 Nausea with vomiting, unspecified: Secondary | ICD-10-CM | POA: Diagnosis not present

## 2020-12-31 DIAGNOSIS — N189 Chronic kidney disease, unspecified: Secondary | ICD-10-CM | POA: Diagnosis not present

## 2020-12-31 DIAGNOSIS — I2510 Atherosclerotic heart disease of native coronary artery without angina pectoris: Secondary | ICD-10-CM | POA: Diagnosis not present

## 2020-12-31 DIAGNOSIS — I4891 Unspecified atrial fibrillation: Secondary | ICD-10-CM | POA: Diagnosis not present

## 2020-12-31 DIAGNOSIS — Z20822 Contact with and (suspected) exposure to covid-19: Secondary | ICD-10-CM | POA: Diagnosis not present

## 2020-12-31 DIAGNOSIS — R000 Tachycardia, unspecified: Secondary | ICD-10-CM | POA: Diagnosis not present

## 2020-12-31 DIAGNOSIS — Z7901 Long term (current) use of anticoagulants: Secondary | ICD-10-CM | POA: Diagnosis not present

## 2020-12-31 DIAGNOSIS — R0789 Other chest pain: Secondary | ICD-10-CM | POA: Insufficient documentation

## 2020-12-31 DIAGNOSIS — R5381 Other malaise: Secondary | ICD-10-CM | POA: Insufficient documentation

## 2020-12-31 DIAGNOSIS — Z79899 Other long term (current) drug therapy: Secondary | ICD-10-CM | POA: Insufficient documentation

## 2020-12-31 DIAGNOSIS — F331 Major depressive disorder, recurrent, moderate: Secondary | ICD-10-CM | POA: Diagnosis not present

## 2020-12-31 DIAGNOSIS — M549 Dorsalgia, unspecified: Secondary | ICD-10-CM | POA: Diagnosis not present

## 2020-12-31 LAB — RESP PANEL BY RT-PCR (FLU A&B, COVID) ARPGX2
Influenza A by PCR: NEGATIVE
Influenza B by PCR: NEGATIVE
SARS Coronavirus 2 by RT PCR: NEGATIVE

## 2020-12-31 LAB — CBC WITH DIFFERENTIAL/PLATELET
Abs Immature Granulocytes: 0.04 10*3/uL (ref 0.00–0.07)
Basophils Absolute: 0.1 10*3/uL (ref 0.0–0.1)
Basophils Relative: 1 %
Eosinophils Absolute: 0.2 10*3/uL (ref 0.0–0.5)
Eosinophils Relative: 2 %
HCT: 47.5 % (ref 39.0–52.0)
Hemoglobin: 15.9 g/dL (ref 13.0–17.0)
Immature Granulocytes: 0 %
Lymphocytes Relative: 27 %
Lymphs Abs: 2.9 10*3/uL (ref 0.7–4.0)
MCH: 29.1 pg (ref 26.0–34.0)
MCHC: 33.5 g/dL (ref 30.0–36.0)
MCV: 86.8 fL (ref 80.0–100.0)
Monocytes Absolute: 0.7 10*3/uL (ref 0.1–1.0)
Monocytes Relative: 6 %
Neutro Abs: 6.9 10*3/uL (ref 1.7–7.7)
Neutrophils Relative %: 64 %
Platelets: 116 10*3/uL — ABNORMAL LOW (ref 150–400)
RBC: 5.47 MIL/uL (ref 4.22–5.81)
RDW: 14.3 % (ref 11.5–15.5)
WBC: 10.8 10*3/uL — ABNORMAL HIGH (ref 4.0–10.5)
nRBC: 0 % (ref 0.0–0.2)

## 2020-12-31 LAB — COMPREHENSIVE METABOLIC PANEL
ALT: 14 U/L (ref 0–44)
AST: 15 U/L (ref 15–41)
Albumin: 3.2 g/dL — ABNORMAL LOW (ref 3.5–5.0)
Alkaline Phosphatase: 84 U/L (ref 38–126)
Anion gap: 6 (ref 5–15)
BUN: 10 mg/dL (ref 8–23)
CO2: 27 mmol/L (ref 22–32)
Calcium: 8.3 mg/dL — ABNORMAL LOW (ref 8.9–10.3)
Chloride: 107 mmol/L (ref 98–111)
Creatinine, Ser: 1.13 mg/dL (ref 0.61–1.24)
GFR, Estimated: >60 mL/min (ref >60)
Glucose, Bld: 101 mg/dL — ABNORMAL HIGH (ref 70–99)
Potassium: 4.1 mmol/L (ref 3.5–5.1)
Sodium: 140 mmol/L (ref 135–145)
Total Bilirubin: 0.8 mg/dL (ref 0.3–1.2)
Total Protein: 5.6 g/dL — ABNORMAL LOW (ref 6.5–8.1)

## 2020-12-31 LAB — TROPONIN I (HIGH SENSITIVITY)
Troponin I (High Sensitivity): 8 ng/L (ref <18)
Troponin I (High Sensitivity): 8 ng/L (ref <18)

## 2020-12-31 LAB — BRAIN NATRIURETIC PEPTIDE: B Natriuretic Peptide: 208.6 pg/mL — ABNORMAL HIGH (ref 0.0–100.0)

## 2020-12-31 MED ORDER — METOPROLOL TARTRATE 25 MG PO TABS: 25.0000 mg | ORAL_TABLET | Freq: Once | ORAL | Status: DC

## 2020-12-31 MED ORDER — IOHEXOL 350 MG/ML SOLN
60.0000 mL | Freq: Once | INTRAVENOUS | Status: AC | PRN
  Administered 2020-12-31: 10:00:00 60 mL via INTRAVENOUS

## 2020-12-31 NOTE — ED Provider Notes (Signed)
°  Physical Exam  BP 101/71 (BP Location: Right Arm)    Pulse 92    Temp 97.7 F (36.5 C) (Oral)    Resp 18    Ht 6\' 1"  (1.854 m)    Wt 98 kg    SpO2 93%    BMI 28.50 kg/m     ED Course/Procedures     Procedures  MDM    19M, hx of COPD, CAD s/p stents, prior CVA on Eliquis, here with SOB, fatigue, indigestion, chest pain, now in new aflutter with RVR. Rate improved with Cardizem. CTA PE study pending.  Patient CTA resulted negative for acute PE.  IMPRESSION: 1. No evidence of pulmonary embolism. 2. 8 mm nodule in the lingula and 5 mm nodule in the right upper lobe. The lingular nodule has not significantly changed since prior examination (measured 7 mm on prior examination of November 2020). Follow-up examination in 6-12 months is recommended.   3. Emphysematous changes. No evidence of pneumonia or pulmonary edema. 4. Enlarged left thyroid lobe, thyroid sonogram for further evaluation is recommended.  He is not hypoxic or tachycardic or tachypneic.  He has a history of COPD and is not on oxygen at baseline.  On reevaluation, the patient is feeling symptomatically improved.  He is persistently in a flutter and occasionally in atrial fibrillation, currently rate controlled.  Patient was restarted his oral metoprolol.  He is currently on anticoagulation and has had history of atrial fibrillation in the past.  Recommended continued anticoagulation and continued oral home beta-blocker and given symptomatic improvement, feel the patient is stable for discharge at this time.  Discussed admission for observation versus discharge and the patient felt comfortable with the plan for discharge home.       Regan Lemming, MD 12/31/20 [phone]

## 2020-12-31 NOTE — ED Provider Notes (Signed)
[facility] EMERGENCY DEPARTMENT Provider Note   CSN: [id number] Arrival date & time: 12/31/20  0402     History Chief Complaint  Patient presents with   Shortness of Breath    David Hendricks is a 70 y.o. male.  Patient is a 70 year old male with past medical history of COPD, coronary artery disease with stent, loop recorder implantation, prior CVA.  Patient presenting today for evaluation of weakness and shortness of breath.  This has been occurring for the past 3 days.  He describes being dyspneic with exertion and also reports nausea and vomiting for the past 2 days.  EMS was called and patient found to be in atrial flutter.  He was given Cardizem by EMS along with normal saline.  He denies fevers or chills, but does report low back pain.  The history is provided by the patient.  Shortness of Breath Severity:  Moderate Onset quality:  Gradual Duration:  3 days Timing:  Constant Progression:  Worsening Chronicity:  New Relieved by:  Nothing Worsened by:  Nothing Ineffective treatments:  None tried Associated symptoms: no fever       Past Medical History:  Diagnosis Date   Allergy    Anxiety    Aortic atherosclerosis (HCC)    Arthritis    Benign prostatic hyperplasia (BPH) with urinary urgency    Chronic kidney disease    KIDNEY STONES   COPD (chronic obstructive pulmonary disease) (HCC)    possible    COPD (chronic obstructive pulmonary disease) ([place])    Coronary artery disease    10/18 PCI/DES to pLAD, mild nonobstructive disease in the Lcx/RCA. Normal EF.    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   Dysphagia following cerebrovascular accident 02/17/2020   Dysarthria due to recent cerebral infarction 02/17/2020   Obesity 02/16/2020   Hyperbilirubinemia 02/16/2020   CVA (cerebral vascular accident) ([place]) 02/15/2020   Thyroid mass 02/15/2020   Anxiety and depression 02/15/2020   Pulmonary emphysema ([place]) 06/02/2019   Aortic atherosclerosis ([place]) 06/02/2019   Smokers' cough ([place]) 06/02/2019   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   Multinodular goiter 12/24/2018   Subclinical hyperthyroidism 12/24/2018   BPH (benign prostatic hyperplasia) 11/20/2018   Pulmonary nodules 11/15/2018   Esophageal thickening 11/15/2018   Cigarette nicotine dependence without complication 92/42/6834   Old MI (myocardial infarction) 01/30/2017   CAD (coronary artery disease) 01/30/2017   Hyperlipidemia 10/10/2016   History of non-ST elevation myocardial infarction (NSTEMI) 10/09/2016   Tobacco abuse 08/28/2016   Difficulty sleeping 04/22/2016   Benign prostatic hyperplasia with urinary frequency 02/11/2016    Past Surgical History:  Procedure Laterality Date   COLONOSCOPY     CORONARY ANGIOPLASTY  10/09/2016   CORONARY STENT INTERVENTION N/A 10/09/2016   Procedure: CORONARY STENT INTERVENTION;  Surgeon: Nelva Bush, MD;  Location: [place] CV LAB;  Service:  Cardiovascular;  Laterality: N/A;   CYSTOSCOPY WITH RETROGRADE PYELOGRAM, URETEROSCOPY AND STENT PLACEMENT Left 06/17/2019   Procedure: CYSTOSCOPY WITH RETROGRADE PYELOGRAM, URETEROSCOPY AND STENT PLACEMENT;  Surgeon: Robley Fries, MD;  Location: [facility];  Service: Urology;  Laterality: Left;  1 HR   HOLMIUM LASER APPLICATION Left 09/13/1738   Procedure: HOLMIUM LASER APPLICATION;  Surgeon: Robley Fries, MD;  Location: [facility];  Service: Urology;  Laterality: Left;   INTRAVASCULAR  ULTRASOUND/IVUS N/A 10/09/2016   Procedure: Intravascular Ultrasound/IVUS;  Surgeon: Nelva Bush, MD;  Location: [place] CV LAB;  Service: Cardiovascular;  Laterality: N/A;   IR CHOLANGIOGRAM EXISTING TUBE  11/18/2018   IR PERC CHOLECYSTOSTOMY  11/16/2018   LEFT HEART CATH AND CORONARY ANGIOGRAPHY N/A 10/09/2016   Procedure: LEFT HEART CATH AND CORONARY ANGIOGRAPHY;  Surgeon: Nelva Bush, MD;  Location: [place] CV LAB;  Service: Cardiovascular;  Laterality: N/A;   LOOP RECORDER INSERTION N/A 02/20/2020   Procedure: LOOP RECORDER INSERTION;  Surgeon: Vickie Epley, MD;  Location: [place] CV LAB;  Service: Cardiovascular;  Laterality: N/A;       Family History  Problem Relation Age of Onset   Alcohol abuse Father        called [facility] per pt   Cancer Father        [facility] PER PT NON SURGICAL    Colon cancer Neg Hx    Colon polyps Neg Hx    Esophageal cancer Neg Hx    Rectal cancer Neg Hx    Stomach cancer Neg Hx     Social History   Tobacco Use   Smoking status: Every Day    Packs/day: 2.00    Years: 45.00    Pack years: 90.00    Types: Cigarettes    Start date: 01/09/1961   Smokeless tobacco: Never   Tobacco comments:    tobacco info given  Vaping Use   Vaping Use: Never used  Substance Use Topics   Alcohol use: No   Drug use: No    Home Medications Prior to Admission medications   Medication Sig Start Date End Date Taking? Authorizing Provider  aspirin EC 81 MG tablet Take 1 tablet (81 mg total) by mouth daily. Swallow whole. 04/27/20   Vickie Epley, MD  atorvastatin (LIPITOR) 80 MG tablet Take 1 tablet (80 mg total) by mouth daily. 03/08/20   Angiulli, Lavon Paganini, PA-C  Baclofen 5 MG TABS Take 5 mg by mouth 4 (four) times daily. 03/31/20   Lovorn, Jinny Blossom, MD  citalopram (CELEXA) 20 MG tablet Take 1 tablet (20 mg total) by mouth daily. 11/12/20   Ngetich, Dinah C, NP  cyclobenzaprine (FLEXERIL) 5 MG tablet Take 5 mg by mouth 3  (three) times daily as needed for muscle spasms.    [provider]  ELIQUIS 5 MG TABS tablet TAKE 1 TABLET(5 MG) BY MOUTH TWICE DAILY 10/18/20   Vickie Epley, MD  gabapentin (NEURONTIN) 100 MG capsule Take 100 mg by mouth daily as needed (pain).    [provider]  melatonin 3 MG TABS tablet Take 1 tablet (3 mg total) by mouth at bedtime. 03/08/20   Angiulli, Lavon Paganini, PA-C  methimazole (TAPAZOLE) 5 MG tablet TAKE 1 TABLET(5 MG) BY MOUTH DAILY Patient taking differently: Take 5 mg by mouth daily. 03/08/20   Angiulli, Lavon Paganini, PA-C  metoprolol succinate (TOPROL XL) 25 MG 24 hr tablet Take 1 tablet (25 mg total) by mouth daily.  04/23/20   Evans Lance, MD  pregabalin (LYRICA) 25 MG capsule Take 25 mg by mouth 3 (three) times daily.    [provider]  pregabalin (LYRICA) 50 MG capsule Take 50 mg by mouth 3 (three) times daily.    [provider]  tamsulosin (FLOMAX) 0.4 MG CAPS capsule Take 2 capsules (0.8 mg total) by mouth daily after supper. 11/02/20   Wardell Honour, MD  traZODone (DESYREL) 100 MG tablet TAKE 1 TABLET BY MOUTH AT BEDTIME 11/09/20   Wardell Honour, MD    Allergies    Patient has no known allergies.  Review of Systems   Review of Systems  Constitutional:  Negative for fever.  Respiratory:  Positive for shortness of breath.   All other systems reviewed and are negative.  Physical Exam Updated Vital Signs BP 101/71 (BP Location: Right Arm)    Pulse 92    Temp 97.7 F (36.5 C) (Oral)    Resp 18    Ht 6\' 1"  (1.854 m)    Wt 98 kg    SpO2 93%    BMI 28.50 kg/m   Physical Exam Vitals and nursing note reviewed.  Constitutional:      General: He is not in acute distress.    Appearance: He is well-developed. He is not diaphoretic.  HENT:     Head: Normocephalic and atraumatic.  Cardiovascular:     Rate and Rhythm: Tachycardia present. Rhythm irregular.     Heart sounds: No murmur heard.   No friction rub.  Pulmonary:      Effort: Pulmonary effort is normal. No respiratory distress.     Breath sounds: Normal breath sounds. No wheezing or rales.  Abdominal:     General: Bowel sounds are normal. There is no distension.     Palpations: Abdomen is soft.     Tenderness: There is no abdominal tenderness.  Musculoskeletal:        General: Normal range of motion.     Cervical back: Normal range of motion and neck supple.  Skin:    General: Skin is warm and dry.  Neurological:     General: No focal deficit present.     Mental Status: He is alert and oriented to person, place, and time.     Cranial Nerves: No cranial nerve deficit.     Coordination: Coordination normal.    ED Results / Procedures / Treatments   Labs (all labs ordered are listed, but only abnormal results are displayed) Labs Reviewed  RESP PANEL BY RT-PCR (FLU A&B, COVID) ARPGX2  COMPREHENSIVE METABOLIC PANEL  CBC WITH DIFFERENTIAL/PLATELET  BRAIN NATRIURETIC PEPTIDE  TROPONIN I (HIGH SENSITIVITY)    EKG EKG Interpretation  Date/Time:  Friday December 31 2020 04:02:39 EST Ventricular Rate:  94 PR Interval:    QRS Duration: 122 QT Interval:  387 QTC Calculation: 484 R Axis:   -76 Text Interpretation: Atrial fibrillation Nonspecific IVCD with LAD When compared with ECG dated 11/30/2020, atrial fibrillation has replaced sinus rhythm Confirmed by Veryl Speak [phone]) on 12/31/2020 4:11:36 AM  Radiology No results found.  Procedures Procedures   Medications Ordered in ED Medications - No data to display  ED Course  I have reviewed the triage vital signs and the nursing notes.  Pertinent labs & imaging results that were available during my care of the patient were reviewed by me and considered in my medical decision making (see chart for details).    MDM Rules/Calculators/A&P  Patient presenting  here with complaints of chest discomfort, shortness of breath, weakness, and generalized malaise.  This has been worsening over the  past 2 days.  He arrives here in atrial fibrillation which appears to be new onset.  I see no record of this in his chart and he denies a history of it.  As he is complaining of shortness of breath and oxygen saturations in the low 90s, I will obtain a CTA of the chest to rule out PE or other acute pathology.  Care signed out to oncoming provider at shift change.  Disposition pending study results.  Final Clinical Impression(s) / ED Diagnoses Final diagnoses:  None    Rx / DC Orders ED Discharge Orders     None        Veryl Speak, MD 12/31/20 0800

## 2020-12-31 NOTE — Discharge Instructions (Addendum)
You were evaluated in the Emergency Department and after careful evaluation, we did not find any emergent condition requiring admission or further testing in the hospital.  Your exam/testing today was overall reassuring. Continue taking your anticoagulant and Metoprolol and follow-up with your PCP.  Please return to the Emergency Department if you experience any worsening of your condition.  Thank you for allowing [place] to be a part of your care.

## 2020-12-31 NOTE — ED Triage Notes (Addendum)
Pt here via EMS with c/o with SOB, back pain, N/V X2 days. Denies fever. Pt in [place] with rate of 130. EMS gave 20mg  Cardizem. Continued to be in A-flutter, rate 70-80. 750 mL NS given. Pt taking Eliquis 20 LAC

## 2021-01-04 ENCOUNTER — Telehealth (HOSPITAL_COMMUNITY): Payer: Self-pay

## 2021-01-04 ENCOUNTER — Telehealth: Payer: Self-pay

## 2021-01-04 NOTE — Telephone Encounter (Signed)
Reached out to patient to schedule ED f/u. Patient stated that he reached out to his PCP because he is unable to get out of bed nausea and dizziness. Stated he thinks he will die soon.

## 2021-01-04 NOTE — Telephone Encounter (Signed)
Incoming call received from patient asking for a rx for dizziness and associated symptoms x 4 days. Patient c/o upset stomach, vomiting and diarrhea . Patient states symptoms prevent him from driving and he needs something sent in.  Patient was advised that he needs an appointment and asked that I consult with Dr.Miller first, as he has experienced dizziness in the past. Patient states " I think my equilibrium is off."  Please advise

## 2021-01-05 NOTE — Telephone Encounter (Signed)
Dr.Miller responded (not sure how as it is not showing in the encounter as a permanent documentation) stating patient needs a brief evaluation.  I called patient and informed him of Dr.Miller's response and he said ok and ended the call abruptly. I held a few seconds to assure that the disconnection was not on our end.

## 2021-01-05 NOTE — Progress Notes (Signed)
[facility] Summary Report / Loop Recorder 

## 2021-01-11 ENCOUNTER — Telehealth: Payer: Self-pay | Admitting: *Deleted

## 2021-01-11 NOTE — Telephone Encounter (Signed)
David Hendricks Transferred a message to Clinical intake.   Patient stated that he is requesting [place] through [place].   I tried calling patient back to schedule a Face to Face visit. LMOM for patient to return call.

## 2021-01-11 NOTE — Telephone Encounter (Signed)
Patient scheduled an appointment with Dr. Sabra Heck

## 2021-01-12 ENCOUNTER — Other Ambulatory Visit (HOSPITAL_COMMUNITY): Payer: Medicare Other

## 2021-01-12 ENCOUNTER — Ambulatory Visit: Payer: Medicare Other | Admitting: Family Medicine

## 2021-01-12 ENCOUNTER — Inpatient Hospital Stay (HOSPITAL_COMMUNITY): Payer: Medicare Other

## 2021-01-12 ENCOUNTER — Encounter (HOSPITAL_COMMUNITY): Payer: Self-pay | Admitting: Internal Medicine

## 2021-01-12 ENCOUNTER — Observation Stay (HOSPITAL_COMMUNITY)
Admission: EM | Admit: 2021-01-12 | Discharge: 2021-01-13 | Disposition: A | Discharge status: Home / Self Care | Payer: Medicare Other | Attending: Internal Medicine | Admitting: Internal Medicine

## 2021-01-12 ENCOUNTER — Emergency Department (HOSPITAL_COMMUNITY): Payer: Medicare Other

## 2021-01-12 ENCOUNTER — Other Ambulatory Visit: Payer: Self-pay

## 2021-01-12 DIAGNOSIS — R338 Other retention of urine: Secondary | ICD-10-CM | POA: Diagnosis present

## 2021-01-12 DIAGNOSIS — R2681 Unsteadiness on feet: Secondary | ICD-10-CM | POA: Insufficient documentation

## 2021-01-12 DIAGNOSIS — F17210 Nicotine dependence, cigarettes, uncomplicated: Secondary | ICD-10-CM | POA: Diagnosis not present

## 2021-01-12 DIAGNOSIS — Z79899 Other long term (current) drug therapy: Secondary | ICD-10-CM | POA: Diagnosis not present

## 2021-01-12 DIAGNOSIS — I4891 Unspecified atrial fibrillation: Secondary | ICD-10-CM | POA: Diagnosis not present

## 2021-01-12 DIAGNOSIS — I639 Cerebral infarction, unspecified: Principal | ICD-10-CM

## 2021-01-12 DIAGNOSIS — R4781 Slurred speech: Secondary | ICD-10-CM | POA: Diagnosis not present

## 2021-01-12 DIAGNOSIS — F329 Major depressive disorder, single episode, unspecified: Secondary | ICD-10-CM | POA: Diagnosis not present

## 2021-01-12 DIAGNOSIS — F419 Anxiety disorder, unspecified: Secondary | ICD-10-CM | POA: Diagnosis present

## 2021-01-12 DIAGNOSIS — I10 Essential (primary) hypertension: Secondary | ICD-10-CM | POA: Diagnosis present

## 2021-01-12 DIAGNOSIS — R29898 Other symptoms and signs involving the musculoskeletal system: Secondary | ICD-10-CM | POA: Diagnosis not present

## 2021-01-12 DIAGNOSIS — Z20822 Contact with and (suspected) exposure to covid-19: Secondary | ICD-10-CM | POA: Diagnosis not present

## 2021-01-12 DIAGNOSIS — I480 Paroxysmal atrial fibrillation: Secondary | ICD-10-CM | POA: Diagnosis not present

## 2021-01-12 DIAGNOSIS — N400 Enlarged prostate without lower urinary tract symptoms: Secondary | ICD-10-CM | POA: Diagnosis present

## 2021-01-12 DIAGNOSIS — R471 Dysarthria and anarthria: Secondary | ICD-10-CM | POA: Diagnosis not present

## 2021-01-12 DIAGNOSIS — F32A Depression, unspecified: Secondary | ICD-10-CM | POA: Diagnosis present

## 2021-01-12 DIAGNOSIS — E785 Hyperlipidemia, unspecified: Secondary | ICD-10-CM | POA: Diagnosis present

## 2021-01-12 DIAGNOSIS — I6389 Other cerebral infarction: Secondary | ICD-10-CM

## 2021-01-12 DIAGNOSIS — G319 Degenerative disease of nervous system, unspecified: Secondary | ICD-10-CM | POA: Diagnosis not present

## 2021-01-12 DIAGNOSIS — R339 Retention of urine, unspecified: Secondary | ICD-10-CM | POA: Diagnosis not present

## 2021-01-12 DIAGNOSIS — R4701 Aphasia: Secondary | ICD-10-CM | POA: Diagnosis not present

## 2021-01-12 DIAGNOSIS — Z7982 Long term (current) use of aspirin: Secondary | ICD-10-CM | POA: Diagnosis not present

## 2021-01-12 DIAGNOSIS — R531 Weakness: Secondary | ICD-10-CM | POA: Diagnosis not present

## 2021-01-12 DIAGNOSIS — R29818 Other symptoms and signs involving the nervous system: Secondary | ICD-10-CM | POA: Diagnosis not present

## 2021-01-12 DIAGNOSIS — I2510 Atherosclerotic heart disease of native coronary artery without angina pectoris: Secondary | ICD-10-CM | POA: Diagnosis not present

## 2021-01-12 DIAGNOSIS — R29810 Facial weakness: Secondary | ICD-10-CM | POA: Diagnosis not present

## 2021-01-12 DIAGNOSIS — R000 Tachycardia, unspecified: Secondary | ICD-10-CM | POA: Diagnosis not present

## 2021-01-12 DIAGNOSIS — G8194 Hemiplegia, unspecified affecting left nondominant side: Secondary | ICD-10-CM | POA: Diagnosis not present

## 2021-01-12 DIAGNOSIS — Q283 Other malformations of cerebral vessels: Secondary | ICD-10-CM | POA: Diagnosis not present

## 2021-01-12 DIAGNOSIS — E0590 Thyrotoxicosis, unspecified without thyrotoxic crisis or storm: Secondary | ICD-10-CM | POA: Diagnosis present

## 2021-01-12 DIAGNOSIS — G479 Sleep disorder, unspecified: Secondary | ICD-10-CM | POA: Diagnosis present

## 2021-01-12 DIAGNOSIS — Z743 Need for continuous supervision: Secondary | ICD-10-CM | POA: Diagnosis not present

## 2021-01-12 DIAGNOSIS — J42 Unspecified chronic bronchitis: Secondary | ICD-10-CM

## 2021-01-12 DIAGNOSIS — E049 Nontoxic goiter, unspecified: Secondary | ICD-10-CM | POA: Diagnosis not present

## 2021-01-12 HISTORY — DX: Other specified anxiety disorders: F41.8

## 2021-01-12 LAB — APTT: aPTT: 26 seconds (ref 24–36)

## 2021-01-12 LAB — DIFFERENTIAL
Abs Immature Granulocytes: 0.03 10*3/uL (ref 0.00–0.07)
Basophils Absolute: 0.1 10*3/uL (ref 0.0–0.1)
Basophils Relative: 1 %
Eosinophils Absolute: 0.3 10*3/uL (ref 0.0–0.5)
Eosinophils Relative: 3 %
Immature Granulocytes: 0 %
Lymphocytes Relative: 29 %
Lymphs Abs: 2.5 10*3/uL (ref 0.7–4.0)
Monocytes Absolute: 0.5 10*3/uL (ref 0.1–1.0)
Monocytes Relative: 6 %
Neutro Abs: 5.4 10*3/uL (ref 1.7–7.7)
Neutrophils Relative %: 61 %

## 2021-01-12 LAB — RAPID URINE DRUG SCREEN, HOSP PERFORMED
Amphetamines: NOT DETECTED
Barbiturates: NOT DETECTED
Benzodiazepines: NOT DETECTED
Cocaine: NOT DETECTED
Opiates: NOT DETECTED
Tetrahydrocannabinol: NOT DETECTED

## 2021-01-12 LAB — HEMOGLOBIN A1C
Hgb A1c MFr Bld: 5.4 % (ref 4.8–5.6)
Mean Plasma Glucose: 108.28 mg/dL

## 2021-01-12 LAB — CBC
HCT: 52.4 % — ABNORMAL HIGH (ref 39.0–52.0)
Hemoglobin: 17.6 g/dL — ABNORMAL HIGH (ref 13.0–17.0)
MCH: 29 pg (ref 26.0–34.0)
MCHC: 33.6 g/dL (ref 30.0–36.0)
MCV: 86.3 fL (ref 80.0–100.0)
Platelets: 145 10*3/uL — ABNORMAL LOW (ref 150–400)
RBC: 6.07 MIL/uL — ABNORMAL HIGH (ref 4.22–5.81)
RDW: 14.2 % (ref 11.5–15.5)
WBC: 8.7 10*3/uL (ref 4.0–10.5)
nRBC: 0 % (ref 0.0–0.2)

## 2021-01-12 LAB — COMPREHENSIVE METABOLIC PANEL
ALT: 14 U/L (ref 0–44)
AST: 20 U/L (ref 15–41)
Albumin: 3.3 g/dL — ABNORMAL LOW (ref 3.5–5.0)
Alkaline Phosphatase: 96 U/L (ref 38–126)
Anion gap: 7 (ref 5–15)
BUN: 11 mg/dL (ref 8–23)
CO2: 26 mmol/L (ref 22–32)
Calcium: 9.1 mg/dL (ref 8.9–10.3)
Chloride: 104 mmol/L (ref 98–111)
Creatinine, Ser: 1.14 mg/dL (ref 0.61–1.24)
GFR, Estimated: >60 mL/min (ref >60)
Glucose, Bld: 133 mg/dL — ABNORMAL HIGH (ref 70–99)
Potassium: 3.8 mmol/L (ref 3.5–5.1)
Sodium: 137 mmol/L (ref 135–145)
Total Bilirubin: 0.9 mg/dL (ref 0.3–1.2)
Total Protein: 5.9 g/dL — ABNORMAL LOW (ref 6.5–8.1)

## 2021-01-12 LAB — I-STAT CHEM 8, ED
BUN: 13 mg/dL (ref 8–23)
Calcium, Ion: 1.09 mmol/L — ABNORMAL LOW (ref 1.15–1.40)
Chloride: 103 mmol/L (ref 98–111)
Creatinine, Ser: 1 mg/dL (ref 0.61–1.24)
Glucose, Bld: 129 mg/dL — ABNORMAL HIGH (ref 70–99)
HCT: 53 % — ABNORMAL HIGH (ref 39.0–52.0)
Hemoglobin: 18 g/dL — ABNORMAL HIGH (ref 13.0–17.0)
Potassium: 4 mmol/L (ref 3.5–5.1)
Sodium: 140 mmol/L (ref 135–145)
TCO2: 29 mmol/L (ref 22–32)

## 2021-01-12 LAB — ECHOCARDIOGRAM COMPLETE
AR max vel: 1.72 cm2
AV Area VTI: 1.34 cm2
AV Area mean vel: 1.52 cm2
AV Mean grad: 3 mmHg
AV Peak grad: 4.4 mmHg
Ao pk vel: 1.05 m/s
Calc EF: 54.3 %
S' Lateral: 3.46 cm
Single Plane A2C EF: 56 %
Single Plane A4C EF: 52.8 %

## 2021-01-12 LAB — PROTIME-INR
INR: 1 (ref 0.8–1.2)
Prothrombin Time: 13 seconds (ref 11.4–15.2)

## 2021-01-12 LAB — LDL CHOLESTEROL, DIRECT: Direct LDL: 89.3 mg/dL (ref 0–99)

## 2021-01-12 LAB — RESP PANEL BY RT-PCR (FLU A&B, COVID) ARPGX2
Influenza A by PCR: NEGATIVE
Influenza B by PCR: NEGATIVE
SARS Coronavirus 2 by RT PCR: NEGATIVE

## 2021-01-12 LAB — CBG MONITORING, ED: Glucose-Capillary: 147 mg/dL — ABNORMAL HIGH (ref 70–99)

## 2021-01-12 LAB — TSH: TSH: 0.143 u[IU]/mL — ABNORMAL LOW (ref 0.350–4.500)

## 2021-01-12 MED ORDER — ENOXAPARIN SODIUM 40 MG/0.4ML IJ SOSY: 40.0000 mg | PREFILLED_SYRINGE | INTRAMUSCULAR | Status: DC

## 2021-01-12 MED ORDER — ASPIRIN 325 MG PO TABS
325.0000 mg | ORAL_TABLET | Freq: Every day | ORAL | Status: DC
  Administered 2021-01-12 – 2021-01-13 (×2): 325 mg via ORAL
  Filled 2021-01-12 (×2): qty 1

## 2021-01-12 MED ORDER — STROKE: EARLY STAGES OF RECOVERY BOOK
Freq: Once | Status: DC
  Filled 2021-01-12: qty 1

## 2021-01-12 MED ORDER — SALINE SPRAY 0.65 % NA SOLN
1.0000 | NASAL | Status: DC | PRN
  Filled 2021-01-12: qty 44

## 2021-01-12 MED ORDER — ATORVASTATIN CALCIUM 80 MG PO TABS
80.0000 mg | ORAL_TABLET | Freq: Every day | ORAL | Status: DC
  Administered 2021-01-12 – 2021-01-13 (×2): 80 mg via ORAL
  Filled 2021-01-12: qty 1
  Filled 2021-01-12: qty 2

## 2021-01-12 MED ORDER — ACETAMINOPHEN 160 MG/5ML PO SOLN: 650.0000 mg | ORAL | Status: DC | PRN

## 2021-01-12 MED ORDER — ACETAMINOPHEN 325 MG PO TABS
650.0000 mg | ORAL_TABLET | ORAL | Status: DC | PRN
  Administered 2021-01-13: 650 mg via ORAL
  Filled 2021-01-12: qty 2

## 2021-01-12 MED ORDER — NICOTINE 21 MG/24HR TD PT24
21.0000 mg | MEDICATED_PATCH | Freq: Every day | TRANSDERMAL | Status: DC
  Administered 2021-01-12 – 2021-01-13 (×2): 21 mg via TRANSDERMAL
  Filled 2021-01-12 (×2): qty 1

## 2021-01-12 MED ORDER — SENNOSIDES-DOCUSATE SODIUM 8.6-50 MG PO TABS: 1.0000 | ORAL_TABLET | Freq: Every evening | ORAL | Status: DC | PRN

## 2021-01-12 MED ORDER — TAMSULOSIN HCL 0.4 MG PO CAPS
0.8000 mg | ORAL_CAPSULE | Freq: Every day | ORAL | Status: DC
  Administered 2021-01-12: 0.8 mg via ORAL
  Filled 2021-01-12: qty 2

## 2021-01-12 MED ORDER — METHIMAZOLE 5 MG PO TABS
5.0000 mg | ORAL_TABLET | Freq: Every day | ORAL | Status: DC
  Administered 2021-01-12 – 2021-01-13 (×2): 5 mg via ORAL
  Filled 2021-01-12 (×2): qty 1

## 2021-01-12 MED ORDER — IOHEXOL 350 MG/ML SOLN
50.0000 mL | Freq: Once | INTRAVENOUS | Status: AC | PRN
  Administered 2021-01-12: 50 mL via INTRAVENOUS

## 2021-01-12 MED ORDER — TRAZODONE HCL 50 MG PO TABS
100.0000 mg | ORAL_TABLET | Freq: Every day | ORAL | Status: DC
  Administered 2021-01-12: 100 mg via ORAL
  Filled 2021-01-12: qty 2

## 2021-01-12 MED ORDER — ASPIRIN 300 MG RE SUPP: 300.0000 mg | Freq: Every day | RECTAL | Status: DC

## 2021-01-12 MED ORDER — SODIUM CHLORIDE 0.9 % IV SOLN: INTRAVENOUS | Status: DC

## 2021-01-12 MED ORDER — BACLOFEN 5 MG HALF TABLET
5.0000 mg | ORAL_TABLET | Freq: Four times a day (QID) | ORAL | Status: DC
  Administered 2021-01-12 – 2021-01-13 (×4): 5 mg via ORAL
  Filled 2021-01-12 (×8): qty 1

## 2021-01-12 MED ORDER — GABAPENTIN 100 MG PO CAPS
100.0000 mg | ORAL_CAPSULE | Freq: Three times a day (TID) | ORAL | Status: DC | PRN
  Administered 2021-01-13 (×2): 100 mg via ORAL
  Filled 2021-01-12 (×2): qty 1

## 2021-01-12 MED ORDER — ACETAMINOPHEN 650 MG RE SUPP: 650.0000 mg | RECTAL | Status: DC | PRN

## 2021-01-12 NOTE — Progress Notes (Signed)
° °  Echocardiogram 2D Echocardiogram has been performed.  Darlina Sicilian M 01/12/2021, 3:35 PM

## 2021-01-12 NOTE — Assessment & Plan Note (Signed)
-  Rate controlled with Toprol XL - but not taking this -Also not taking Eliquis -Will hold both for now

## 2021-01-12 NOTE — Assessment & Plan Note (Signed)
-  Recheck TSH -Continue methimazole

## 2021-01-12 NOTE — ED Triage Notes (Signed)
Pt from home via EMS-went to bed at 2200 last night completely normal and woke up this morning with L sided weakness, slurred speech, and facial droop. A/O x 4.

## 2021-01-12 NOTE — ED Notes (Signed)
Patient transported to CT 

## 2021-01-12 NOTE — ED Notes (Signed)
Patient is resting comfortably. Updated on POC.  Pt aware that urine sample is requested.urinal at [facility].  IVF infusing. Monitors intact

## 2021-01-12 NOTE — Consult Note (Signed)
NEUROLOGY CONSULTATION NOTE   Date of service: January 12, 2021 Patient Name: David Hendricks MRN:  [id number] DOB:  01-20-1950 Reason for consult: stroke code Requesting physician: Dr. Charlesetta Shanks _ _ _   _ __   _ __ _ _  __ __   _ __   __ _  History of Present Illness   This is a 71 year old gentleman with a history of a. fib on Eliquis last dose last night, hyperlipidemia, COPD, coronary artery disease s/p prior MI who presents with L facial droop, dysarthria, and mild L sided weakness.  Last known well was last night at 2200 when he went to bed.  NIH stroke scale is 4 for left facial droop, dysarthria, drift in left arm, drift in left leg. CTH showed small acute R precentral infarct (personal review). TNK not administered 2/2 presentation outside the window and contraindication of therapeutic anticoagulation. CTA not performed during stroke code 2/2 exam not c/w LVO. Patient has hx prior stroke with residual incoordination and numbness in L hand.   ROS   Per HPI: all other systems reviewed and are negative  Past History   I have reviewed the following:  Past Medical History:  Diagnosis Date   Allergy    Anxiety    Aortic atherosclerosis (HCC)    Arthritis    Benign prostatic hyperplasia (BPH) with urinary urgency    Chronic kidney disease    KIDNEY STONES   COPD (chronic obstructive pulmonary disease) ([place])    possible    COPD (chronic obstructive pulmonary disease) ([place])    Coronary artery disease    10/18 PCI/DES to pLAD, mild nonobstructive disease in the Lcx/RCA. Normal EF.    Depression    Dyslipidemia    Dysrhythmia 2016   irregular heartbeat   Gallstones    GERD (gastroesophageal reflux disease)    History of kidney stones    Hyperlipidemia    Hyperthyroidism    Leg cramps    Multiple pulmonary nodules    Myocardial infarction ([place])    2018   Pneumonia    Seasonal allergies    Stroke [facility])    Thyroid nodule    Vertigo    Vitamin B 12 deficiency    Past  Surgical History:  Procedure Laterality Date   COLONOSCOPY     CORONARY ANGIOPLASTY  10/09/2016   CORONARY STENT INTERVENTION N/A 10/09/2016   Procedure: CORONARY STENT INTERVENTION;  Surgeon: Nelva Bush, MD;  Location: [place] [facility];  Service: Cardiovascular;  Laterality: N/A;   CYSTOSCOPY WITH RETROGRADE PYELOGRAM, URETEROSCOPY AND STENT PLACEMENT Left 06/17/2019   Procedure: CYSTOSCOPY WITH RETROGRADE PYELOGRAM, URETEROSCOPY AND STENT PLACEMENT;  Surgeon: Robley Fries, MD;  Location: [facility];  Service: Urology;  Laterality: Left;  1 HR   HOLMIUM LASER APPLICATION Left 0/02/7251   Procedure: HOLMIUM LASER APPLICATION;  Surgeon: Robley Fries, MD;  Location: [facility];  Service: Urology;  Laterality: Left;   INTRAVASCULAR ULTRASOUND/IVUS N/A 10/09/2016   Procedure: Intravascular Ultrasound/IVUS;  Surgeon: Nelva Bush, MD;  Location: [place] [facility];  Service: Cardiovascular;  Laterality: N/A;   IR CHOLANGIOGRAM EXISTING TUBE  11/18/2018   IR PERC CHOLECYSTOSTOMY  11/16/2018   LEFT HEART CATH AND CORONARY ANGIOGRAPHY N/A 10/09/2016   Procedure: LEFT HEART CATH AND CORONARY ANGIOGRAPHY;  Surgeon: Nelva Bush, MD;  Location: [place] [facility];  Service: Cardiovascular;  Laterality: N/A;   LOOP RECORDER INSERTION N/A 02/20/2020   Procedure: LOOP RECORDER INSERTION;  Surgeon: Lars Mage  T, MD;  Location: [place] [facility];  Service: Cardiovascular;  Laterality: N/A;   Family History  Problem Relation Age of Onset   Alcohol abuse Father        called [facility] per pt   Cancer Father        [facility] PER PT NON SURGICAL    Colon cancer Neg Hx    Colon polyps Neg Hx    Esophageal cancer Neg Hx    Rectal cancer Neg Hx    Stomach cancer Neg Hx    Social History   Socioeconomic History   Marital status: Divorced    Spouse name: Not on file   Number of children: 0   Years of education: Not on file   Highest education level: Not on file  Occupational  History   Occupation: retired Mudlogger  Tobacco Use   Smoking status: Every Day    Packs/day: 2.00    Years: 45.00    Pack years: 90.00    Types: Cigarettes    Start date: 01/09/1961   Smokeless tobacco: Never   Tobacco comments:    tobacco info given  Vaping Use   Vaping Use: Never used  Substance and Sexual Activity   Alcohol use: No   Drug use: No   Sexual activity: Not on file  Other Topics Concern   Not on file  Social History Narrative   Not on file   Social Determinants of Health   Financial Resource Strain: Not on file  Food Insecurity: Not on file  Transportation Needs: Not on file  Physical Activity: Not on file  Stress: Not on file  Social Connections: Not on file   No Known Allergies  Medications   (Not in a hospital admission)     Current Facility-Administered Medications:    cyanocobalamin ((VITAMIN B-12)) injection 1,000 mcg, 1,000 mcg, Intramuscular, Q30 days, Horald Pollen, MD, 1,000 mcg at 08/29/19 1525  Current Outpatient Medications:    acetaminophen (TYLENOL) 500 MG tablet, Take 1,000 mg by mouth every 6 (six) hours as needed for mild pain., Disp: , Rfl:    aspirin EC 81 MG tablet, Take 1 tablet (81 mg total) by mouth daily. Swallow whole., Disp: 90 tablet, Rfl: 3   atorvastatin (LIPITOR) 80 MG tablet, Take 1 tablet (80 mg total) by mouth daily., Disp: 30 tablet, Rfl: 3   Baclofen 5 MG TABS, Take 5 mg by mouth 4 (four) times daily., Disp: 120 tablet, Rfl: 5   citalopram (CELEXA) 20 MG tablet, Take 1 tablet (20 mg total) by mouth daily., Disp: 90 tablet, Rfl: 1   ELIQUIS 5 MG TABS tablet, TAKE 1 TABLET(5 MG) BY MOUTH TWICE DAILY (Patient taking differently: Take 5 mg by mouth 2 (two) times daily.), Disp: 60 tablet, Rfl: 3   melatonin 3 MG TABS tablet, Take 1 tablet (3 mg total) by mouth at bedtime. (Patient taking differently: Take 3 mg by mouth at bedtime as needed (sleep).), Disp: 30 tablet, Rfl: 0   methimazole (TAPAZOLE) 5 MG  tablet, TAKE 1 TABLET(5 MG) BY MOUTH DAILY (Patient taking differently: Take 5 mg by mouth daily.), Disp: 30 tablet, Rfl: 1   metoprolol succinate (TOPROL XL) 25 MG 24 hr tablet, Take 1 tablet (25 mg total) by mouth daily. (Patient not taking: Reported on 12/31/2020), Disp: 30 tablet, Rfl: 3   oxymetazoline (AFRIN) 0.05 % nasal spray, Place 2 sprays into both nostrils 2 (two) times daily as needed for congestion.,  Disp: , Rfl:    pregabalin (LYRICA) 50 MG capsule, Take 50 mg by mouth 2 (two) times daily as needed (pain)., Disp: , Rfl:    tamsulosin (FLOMAX) 0.4 MG CAPS capsule, Take 2 capsules (0.8 mg total) by mouth daily after supper. (Patient not taking: Reported on 12/31/2020), Disp: 30 capsule, Rfl: 0   traZODone (DESYREL) 100 MG tablet, TAKE 1 TABLET BY MOUTH AT BEDTIME (Patient taking differently: Take 100 mg by mouth at bedtime.), Disp: 30 tablet, Rfl: 3  Vitals   Vitals:   01/12/21 0854 01/12/21 0915 01/12/21 0930  BP: 105/72 125/69 116/74  Pulse: (!) 104 (!) 54 (!) 106  Resp: 18 (!) 22 18  Temp: 98.4 F (36.9 C)    TempSrc: Oral    SpO2: 94% 91%      There is no height or weight on file to calculate BMI.  Physical Exam   Physical Exam Gen: A&O x4, NAD HEENT: Atraumatic, normocephalic;mucous membranes moist; oropharynx clear, tongue without atrophy or fasciculations. Neck: Supple, trachea midline. Resp: CTAB, no w/r/r CV: RRR, no m/g/r; nml S1 and S2. 2+ symmetric peripheral pulses. Abd: soft/NT/ND; nabs x 4 quad Extrem: Nml bulk; no cyanosis, clubbing, or edema.  Neuro: *MS: A&O x4. Follows multi-step commands.  *Speech: mild dysarthria, no aphasia, able to name and repeat *CN:    I: Deferred   II,III: PERRLA, VFF by confrontation, optic discs unable to be visualized 2/2 pupillary constriction   III,IV,VI: EOMI w/o nystagmus, no ptosis   V: Sensation intact from V1 to V3 to LT   VII: Eyelid closure was full.  L UMN facial droop.   VIII: Hearing intact to voice    IX,X: Voice normal, palate elevates symmetrically    XI: SCM/trap 5/5 bilat   XII: Tongue protrudes midline, no atrophy or fasciculations   *Motor:   Normal bulk.  No tremor, rigidity or bradykinesia. Slight drift LUE and LLE but not to bed. RUE and RLE full strength throughout. *Sensory: Intact to light touch, pinprick, temperature vibration throughout. Symmetric. Propioception intact bilat.  No double-simultaneous extinction.  *Coordination:  Finger-to-nose, heel-to-shin, rapid alternating motions were intact. *Reflexes:  2+ and symmetric throughout without clonus; toes down-going bilat *Gait: deferred  NIHSS = 4 (dysarthria, facial droop, LUE drift, LLE drift)   Premorbid mRS = 1   Labs   CBC:  Recent Labs  Lab 01/12/21 0900 01/12/21 0914  WBC 8.7  --   NEUTROABS 5.4  --   HGB 17.6* 18.0*  HCT 52.4* 53.0*  MCV 86.3  --   PLT 145*  --     Basic Metabolic Panel:  Lab Results  Component Value Date   NA 140 01/12/2021   K 4.0 01/12/2021   CO2 27 12/31/2020   GLUCOSE 129 (H) 01/12/2021   BUN 13 01/12/2021   CREATININE 1.00 01/12/2021   CALCIUM 8.3 (L) 12/31/2020   GFRNONAA >60 12/31/2020   GFRAA 51 (L) 06/11/2019   Lipid Panel:  Lab Results  Component Value Date   LDLCALC 49 08/12/2020   HgbA1c:  Lab Results  Component Value Date   HGBA1C 5.4 08/12/2020   Urine Drug Screen:     Component Value Date/Time   LABOPIA NONE DETECTED 02/15/2020 1753   COCAINSCRNUR NONE DETECTED 02/15/2020 1753   LABBENZ NONE DETECTED 02/15/2020 1753   AMPHETMU NONE DETECTED 02/15/2020 1753   THCU NONE DETECTED 02/15/2020 1753   LABBARB NONE DETECTED 02/15/2020 1753    Alcohol Level  Component Value Date/Time   [facility] <10 11/15/2018 0555     Impression   This is a 71 year old gentleman with a history of a. fib on Eliquis last dose last night, hyperlipidemia, COPD, coronary artery disease s/p prior MI who presents with L facial droop, dysarthria, and mild L sided weakness  c/f acute ischemic infarct. TNK not administered 2/2 presentation outside window and contraindication of therapeutic anticoagulation.   Recommendations   - Admit to hospitalist service for stroke w/u; stroke team will consult - Permissive HTN x48 hrs from sx onset or until stroke ruled out by MRI goal BP <220/110. PRN labetalol or hydralazine if BP above these parameters. Avoid oral antihypertensives. - MRI brain wo contrast - ordered STAT to determine if eliquis can be continued. Hold eliquis for now. No indication for antiplatelet if eliquis is continued based on MRI findings.  - CTA H&N - TTE - Check A1c and LDL + add statin per guidelines - q4 hr neuro checks - STAT head CT for any change in neuro exam - Tele - PT/OT/SLP - Stroke education - Amb referral to neurology upon discharge   Stroke team will continue to follow.   ______________________________________________________________________   Thank you for the opportunity to take part in the care of this patient. If you have any further questions, please contact the neurology consultation attending.  Signed,  Su Monks, MD [facility] [phone]  If 7pm- 7am, please page neurology on call as listed in [place].

## 2021-01-12 NOTE — Code Documentation (Signed)
Stroke Response Nurse Documentation Code Documentation  David Hendricks is a 71 y.o. male arriving to [facility] ED via [facility] on 01/12/21 with past medical hx of CVA, afib. On Eliquis (apixaban) daily. Code stroke was activated by ED RN.  Patient from home where he was LKW when he went to bed at 2200 on 01/11/21 and woke up complaining of left sided weakness and slurred speech.  Stroke team at the bedside. Labs drawn and patient cleared for CT by Dr. Johnney Killian. Patient to CT with team. NIHSS 4, see documentation for details and code stroke times. Patient with left facial droop, left arm weakness, and left leg weakness and slurred speech on exam. The following imaging was completed: CT. Patient is not a candidate for IV Thrombolytic due to being out of the window. Patient is not a candidate for IR due to VAN negative.   Care/Plan: Q2 NIHSS and vital signs.   Bedside handoff with ED RN Judson Roch.    Meda Klinefelter  Stroke Response RN

## 2021-01-12 NOTE — Assessment & Plan Note (Addendum)
-  Patient presenting with L-sided weakness and facial droop; has prior h/o CVA and has not been routinely taking medications including Eliquis -Concerning for TIA/CVA -ABCD2 score is 5 -tPA cannot be given since symptoms developed overnight, unknown duration -Aspirin has been given to reduce stroke mortality and decrease morbidity -Will admit for further CVA evaluation -Telemetry monitoring -MRI -CTA -Echo -Risk stratification with FLP; will also check TSH and UDS -Patient will need DAPT for 21 days when ABCD2 score is at least 4 and NIH score is 3 or less, and then can transition to monotherapy with a single antiplatelet agent.  Will defer to neurology for now. -Neurology consult -PT/OT/ST/Nutrition Consults -May need CIR  HTN -Allow permissive HTN for now -Treat BP only if >220/120, and then with goal of 15% reduction -He has not been taking his Toprol XL   HLD -Check FLP -Resume Lipitor 80 mg daily - he has not been taking this either

## 2021-01-12 NOTE — ED Notes (Signed)
Patient is resting comfortably. 

## 2021-01-12 NOTE — Assessment & Plan Note (Signed)
-  Patient has h/o BPH but denies issues with retention -He reports inability to void despite feeling the urge since his arrival in the ER -Will do bladder scan -May need foley if CVA and/or BPH is causing acute retention

## 2021-01-12 NOTE — Assessment & Plan Note (Signed)
-  Continue trazodone -Reported not taking melatonin to med tech, but said he was taking this to me; will hold for now

## 2021-01-12 NOTE — ED Provider Notes (Addendum)
[facility] EMERGENCY DEPARTMENT Provider Note   CSN: [id number] Arrival date & time: 01/12/21  0847     History  Chief Complaint  Patient presents with   Weakness   Aphasia    David Hendricks is a 71 y.o. male.  HPI Patient went to bed last night at 2200 at baseline function.  Patient does report he had a stroke in February.  He is anticoagulated on Eliquis.  He reports at baseline he has normal function of his extremities.  He reports he did rehabilitation therapy and does not have motor dysfunction.  This morning he awakened and had weakness in the left arm and speech was slurred.  He reports he was trying to hold a cup of coffee and could not hold it.  Speech is slurred which she reports is not normal for him.  He denies he has a headache.  He denies loss of vision.    Home Medications Prior to Admission medications   Medication Sig Start Date End Date Taking? Authorizing Provider  acetaminophen (TYLENOL) 500 MG tablet Take 1,000 mg by mouth every 6 (six) hours as needed for mild pain.    [provider]  aspirin EC 81 MG tablet Take 1 tablet (81 mg total) by mouth daily. Swallow whole. 04/27/20   Vickie Epley, MD  atorvastatin (LIPITOR) 80 MG tablet Take 1 tablet (80 mg total) by mouth daily. 03/08/20   Angiulli, Lavon Paganini, PA-C  Baclofen 5 MG TABS Take 5 mg by mouth 4 (four) times daily. 03/31/20   Lovorn, Jinny Blossom, MD  citalopram (CELEXA) 20 MG tablet Take 1 tablet (20 mg total) by mouth daily. 11/12/20   Ngetich, Dinah C, NP  ELIQUIS 5 MG TABS tablet TAKE 1 TABLET(5 MG) BY MOUTH TWICE DAILY Patient taking differently: Take 5 mg by mouth 2 (two) times daily. 10/18/20   Vickie Epley, MD  melatonin 3 MG TABS tablet Take 1 tablet (3 mg total) by mouth at bedtime. Patient taking differently: Take 3 mg by mouth at bedtime as needed (sleep). 03/08/20   Angiulli, Lavon Paganini, PA-C  methimazole (TAPAZOLE) 5 MG tablet TAKE 1 TABLET(5 MG) BY MOUTH DAILY Patient  taking differently: Take 5 mg by mouth daily. 03/08/20   Angiulli, Lavon Paganini, PA-C  metoprolol succinate (TOPROL XL) 25 MG 24 hr tablet Take 1 tablet (25 mg total) by mouth daily. Patient not taking: Reported on 12/31/2020 04/23/20   Evans Lance, MD  oxymetazoline (AFRIN) 0.05 % nasal spray Place 2 sprays into both nostrils 2 (two) times daily as needed for congestion.    [provider]  pregabalin (LYRICA) 50 MG capsule Take 50 mg by mouth 2 (two) times daily as needed (pain).    [provider]  tamsulosin (FLOMAX) 0.4 MG CAPS capsule Take 2 capsules (0.8 mg total) by mouth daily after supper. Patient not taking: Reported on 12/31/2020 11/02/20   Wardell Honour, MD  traZODone (DESYREL) 100 MG tablet TAKE 1 TABLET BY MOUTH AT BEDTIME Patient taking differently: Take 100 mg by mouth at bedtime. 11/09/20   Wardell Honour, MD      Allergies    Patient has no known allergies.    Review of Systems   Review of Systems 10 systems reviewed negative except as per HPI Physical Exam Updated Vital Signs BP 116/74    Pulse (!) 106    Temp 98.4 F (36.9 C) (Oral)    Resp 18    SpO2  91%  Physical Exam Constitutional:      Comments: Patient is awake and alert.  He does not have respiratory distress.  HENT:     Mouth/Throat:     Pharynx: Oropharynx is clear.  Eyes:     Comments: Pupils are symmetric 3 mm.  Patient seems to have gaze slightly limited to the left.  More easily follows instructions to gaze far right and needs additional coaching to gaze far left.  Cardiovascular:     Comments: Tachycardia.  Irregularly irregular. Pulmonary:     Comments: No respiratory distress.  Occasional wet sounding cough.  Symmetric air flow Abdominal:     General: There is no distension.     Palpations: Abdomen is soft.     Tenderness: There is no abdominal tenderness. There is no guarding.  Musculoskeletal:     Comments: No significant peripheral edema.  No active appearing wounds  on the lower extremities.  Skin:    General: Skin is warm and dry.  Neurological:     Comments: Patient is awake and situationally aware.  He is following commands.  Recall seems to be intact.  Patient has mild facial droop on the left and slurred speech but intelligible.  Mild ataxia for finger-nose on the left.  Motor strength left upper extremity decreased throughout the right.  Right grip strength 5\5.  Left grip strength 3\5.  Motor strength lower extremities patient can elevate both lower extremities off the bed independently and hold against resistance.  Mild ataxia on the left lower extremity.  Psychiatric:        Mood and Affect: Mood normal.    ED Results / Procedures / Treatments   Labs (all labs ordered are listed, but only abnormal results are displayed) Labs Reviewed  CBC - Abnormal; Notable for the following components:      Result Value   RBC 6.07 (*)    Hemoglobin 17.6 (*)    HCT 52.4 (*)    Platelets 145 (*)    All other components within normal limits  COMPREHENSIVE METABOLIC PANEL - Abnormal; Notable for the following components:   Glucose, Bld 133 (*)    Total Protein 5.9 (*)    Albumin 3.3 (*)    All other components within normal limits  CBG MONITORING, ED - Abnormal; Notable for the following components:   Glucose-Capillary 147 (*)    All other components within normal limits  I-STAT CHEM 8, ED - Abnormal; Notable for the following components:   Glucose, Bld 129 (*)    Calcium, Ion 1.09 (*)    Hemoglobin 18.0 (*)    HCT 53.0 (*)    All other components within normal limits  DIFFERENTIAL  PROTIME-INR  APTT  LDL CHOLESTEROL, DIRECT  HEMOGLOBIN A1C  LDL CHOLESTEROL, DIRECT  CBG MONITORING, ED    EKG EKG Interpretation  Date/Time:  Wednesday January 12 2021 08:51:09 EST Ventricular Rate:  102 PR Interval:    QRS Duration: 120 QT Interval:  357 QTC Calculation: 465 R Axis:   -79 Text Interpretation: Atrial fibrillation IVCD, consider atypical  RBBB Inferior infarct, old no sig change from previous Confirmed by Charlesetta Shanks [phone]) on 01/12/2021 9:48:50 AM  Radiology CT HEAD CODE STROKE WO CONTRAST  Result Date: 01/12/2021 CLINICAL DATA:  Code stroke.  Left-sided weakness EXAM: CT HEAD WITHOUT CONTRAST TECHNIQUE: Contiguous axial images were obtained from the base of the skull through the vertex without intravenous contrast. COMPARISON:  11/30/2020 FINDINGS: Brain: No acute intracranial hemorrhage, mass  effect, or edema. New small area of low density with loss of gray-white differentiation in the more medial right precentral gyrus (series 4, image 25). Small chronic right posterior frontal infarct. Patchy and confluent hypoattenuation in the supratentorial white matter probably reflects stable chronic microvascular ischemic changes. Prominence of the ventricles and sulci reflects stable parenchymal volume loss. No extra-axial collection. Vascular: No definite hyperdense vessel. Skull: Unremarkable. Sinuses/Orbits: No acute abnormality. Other: Mastoid air cells are clear. ASPECTS [facility] Stroke Program Early CT Score) - Ganglionic level infarction (caudate, lentiform nuclei, internal capsule, insula, M1-M3 cortex): 7 - Supraganglionic infarction (M4-M6 cortex): 2 Total score (0-10 with 10 being normal): 9 IMPRESSION: There is no acute intracranial hemorrhage. Suspect small acute cortical infarct involving the more medial right precentral gyrus. These results were communicated to Dr. Quinn Axe at 9:20 am on 01/12/2021 by text page via the [facility] messaging system. Electronically Signed   By: Macy Mis M.D.   On: 01/12/2021 09:27    Procedures Procedures  CRITICAL CARE Performed by: Charlesetta Shanks   Total critical care time: 30 minutes  Critical care time was exclusive of separately billable procedures and treating other patients.  Critical care was necessary to treat or prevent imminent or life-threatening deterioration.  Critical care was  time spent personally by me on the following activities: development of treatment plan with patient and/or surrogate as well as nursing, discussions with consultants, evaluation of patient's response to treatment, examination of patient, obtaining history from patient or surrogate, ordering and performing treatments and interventions, ordering and review of laboratory studies, ordering and review of radiographic studies, pulse oximetry and re-evaluation of patient's condition.   Medications Ordered in ED Medications - No data to display  ED Course/ Medical Decision Making/ A&P                           Medical Decision Making  Patient presents with new deficit of left upper extremity weakness and ataxia with slurred speech.  Patient does have prior history of stroke but reports has no residual impairment of left upper extremity from prior stroke.  At this time, with extremity motor impairment, speech change and questionable gaze limitation, will activate for code stroke evaluation.  Patient is already anticoagulated on Eliquis.  He is not endorsing headache at this time, lower suspicion for acute intracerebral bleed.  Patient does have tachycardia with atrial fibrillation, rates in the low 100s.  NIHSS=4  Consult: Dr. Quinn Axe for neurology Consult: Dr. Lorin Mercy with [facility] admission  CT head does not show an acute bleed.  Dr. Quinn Axe of neurology has evaluated the patient at this time with NIH score four.,  Patient not meeting LVO criteria but does have findings consistent with acute stroke.  Recommended plan is for admission to medical service.  Patient is stable for admission.  His mental status is awake and alert without somnolence and airway protected.  Patient does have wet cough but also has significant history of COPD.  At this time not exhibiting respiratory distress but may also be experiencing symptoms of COPD exacerbation. Final Clinical Impression(s) / ED Diagnoses Final  diagnoses:  Cerebrovascular accident (CVA), unspecified mechanism ([place])  Chronic bronchitis, unspecified chronic bronchitis type [facility])    Rx / DC Orders ED Discharge Orders     None         Charlesetta Shanks, MD 01/12/21 1024    Charlesetta Shanks, MD 01/12/21 1025

## 2021-01-12 NOTE — Assessment & Plan Note (Signed)
-  Stopped taking his Celexa in addition to other medications -This was not restarted at the time of admission but may need to be resumed since medication noncompliance can be a symptom of depression

## 2021-01-12 NOTE — Assessment & Plan Note (Signed)
-  ASA for now due to CVA

## 2021-01-12 NOTE — ED Notes (Signed)
Patient transported to MRI 

## 2021-01-12 NOTE — Assessment & Plan Note (Signed)
-  Encourage cessation.   -This was discussed with the patient and should be reviewed on an ongoing basis.   -Patch ordered at patient request. -He smokes 2 PPD and this may be a challenge as an inpatient.

## 2021-01-12 NOTE — Assessment & Plan Note (Signed)
-  Has h/o BPH, now with urinary retention as noted above -Resume Flomax - has not been taking this regularly either so this may be the cause of his retention

## 2021-01-12 NOTE — ED Notes (Signed)
[facility] called to activate code stroke per RN Lenore Manner request, Spoke to [place].

## 2021-01-12 NOTE — ED Notes (Signed)
Pt to stress test at this time.

## 2021-01-12 NOTE — H&P (Signed)
History and Physical    Patient: David Hendricks PJK:[id number] DOB: August 20, 1950 DOA: 01/12/2021 DOS: the patient was seen and examined on 01/12/2021 PCP: Wardell Honour, MD  Patient coming from: Home - lives alone; NOK: [place], [place], [phone]   Chief Complaint: stroke-like symptoms  HPI: David Hendricks is a 71 y.o. male with medical history significant of CAD; COPD; BPH; HLD; hypothyroidism; afib on Eliquis; and CVA presenting with L-sided weakness, slurred speech, facial droop.  He reports that he has not been taking his medications regularly for the last few weeks.  He periodically takes Eliquis, does not take metoprolol.  He has a prior h/o CVA in February.  He awoke this AM with L facial droop, mild dysarthria, LUE numbness/tingling, decreased grip strength, and LLE weakness/stiffness.  He reports nasal congestion.  He also notes inability to void since arrival but feeling the need to.    ER Course:  Probable small acute stroke.  LKW last night.  On Eliquis at baseline.  No tPA.    Review of Systems: As mentioned in the history of present illness. All other systems reviewed and are negative. Past Medical History:  Diagnosis Date   Aortic atherosclerosis (HCC)    Arthritis    Benign prostatic hyperplasia (BPH) with urinary urgency    COPD (chronic obstructive pulmonary disease) ([place])    Coronary artery disease    10/18 PCI/DES to pLAD, mild nonobstructive disease in the Lcx/RCA. Normal EF.    Depression with anxiety    Dyslipidemia    Dysrhythmia 2016   irregular heartbeat   Gallstones    GERD (gastroesophageal reflux disease)    History of kidney stones    Hyperthyroidism    Leg cramps    Multiple pulmonary nodules    Myocardial infarction ([place])    2018   Pneumonia    Seasonal allergies    Stroke [facility])    Vitamin B 12 deficiency    Past Surgical History:  Procedure Laterality Date   COLONOSCOPY     CORONARY ANGIOPLASTY  10/09/2016   CORONARY STENT  INTERVENTION N/A 10/09/2016   Procedure: CORONARY STENT INTERVENTION;  Surgeon: Nelva Bush, MD;  Location: [place] [facility];  Service: Cardiovascular;  Laterality: N/A;   CYSTOSCOPY WITH RETROGRADE PYELOGRAM, URETEROSCOPY AND STENT PLACEMENT Left 06/17/2019   Procedure: CYSTOSCOPY WITH RETROGRADE PYELOGRAM, URETEROSCOPY AND STENT PLACEMENT;  Surgeon: Robley Fries, MD;  Location: [facility];  Service: Urology;  Laterality: Left;  1 HR   HOLMIUM LASER APPLICATION Left 8/0/9983   Procedure: HOLMIUM LASER APPLICATION;  Surgeon: Robley Fries, MD;  Location: [facility];  Service: Urology;  Laterality: Left;   INTRAVASCULAR ULTRASOUND/IVUS N/A 10/09/2016   Procedure: Intravascular Ultrasound/IVUS;  Surgeon: Nelva Bush, MD;  Location: [place] [facility];  Service: Cardiovascular;  Laterality: N/A;   IR CHOLANGIOGRAM EXISTING TUBE  11/18/2018   IR PERC CHOLECYSTOSTOMY  11/16/2018   LEFT HEART CATH AND CORONARY ANGIOGRAPHY N/A 10/09/2016   Procedure: LEFT HEART CATH AND CORONARY ANGIOGRAPHY;  Surgeon: Nelva Bush, MD;  Location: [place] [facility];  Service: Cardiovascular;  Laterality: N/A;   LOOP RECORDER INSERTION N/A 02/20/2020   Procedure: LOOP RECORDER INSERTION;  Surgeon: Vickie Epley, MD;  Location: [place] [facility];  Service: Cardiovascular;  Laterality: N/A;   Social History:  reports that he has been smoking cigarettes. He started smoking about 60 years ago. He has a 90.00 pack-year smoking history. He has never used smokeless tobacco. He reports that he  does not currently use alcohol. He reports that he does not use drugs.  No Known Allergies  Family History  Problem Relation Age of Onset   Alcohol abuse Father        called [facility] per pt   Cancer Father        [facility] PER PT NON SURGICAL    Colon cancer Neg Hx    Colon polyps Neg Hx    Esophageal cancer Neg Hx    Rectal cancer Neg Hx    Stomach cancer Neg Hx     Prior to Admission medications    Medication Sig Start Date End Date Taking? Authorizing Provider  acetaminophen (TYLENOL) 500 MG tablet Take 1,000 mg by mouth every 6 (six) hours as needed for mild pain.    [provider]  aspirin EC 81 MG tablet Take 1 tablet (81 mg total) by mouth daily. Swallow whole. 04/27/20   Vickie Epley, MD  atorvastatin (LIPITOR) 80 MG tablet Take 1 tablet (80 mg total) by mouth daily. 03/08/20   Angiulli, Lavon Paganini, PA-C  Baclofen 5 MG TABS Take 5 mg by mouth 4 (four) times daily. 03/31/20   Lovorn, Jinny Blossom, MD  citalopram (CELEXA) 20 MG tablet Take 1 tablet (20 mg total) by mouth daily. 11/12/20   Ngetich, Dinah C, NP  ELIQUIS 5 MG TABS tablet TAKE 1 TABLET(5 MG) BY MOUTH TWICE DAILY Patient taking differently: Take 5 mg by mouth 2 (two) times daily. 10/18/20   Vickie Epley, MD  melatonin 3 MG TABS tablet Take 1 tablet (3 mg total) by mouth at bedtime. Patient taking differently: Take 3 mg by mouth at bedtime as needed (sleep). 03/08/20   Angiulli, Lavon Paganini, PA-C  methimazole (TAPAZOLE) 5 MG tablet TAKE 1 TABLET(5 MG) BY MOUTH DAILY Patient taking differently: Take 5 mg by mouth daily. 03/08/20   Angiulli, Lavon Paganini, PA-C  metoprolol succinate (TOPROL XL) 25 MG 24 hr tablet Take 1 tablet (25 mg total) by mouth daily. Patient not taking: Reported on 12/31/2020 04/23/20   Evans Lance, MD  oxymetazoline (AFRIN) 0.05 % nasal spray Place 2 sprays into both nostrils 2 (two) times daily as needed for congestion.    [provider]  pregabalin (LYRICA) 50 MG capsule Take 50 mg by mouth 2 (two) times daily as needed (pain).    [provider]  tamsulosin (FLOMAX) 0.4 MG CAPS capsule Take 2 capsules (0.8 mg total) by mouth daily after supper. Patient not taking: Reported on 12/31/2020 11/02/20   Wardell Honour, MD  traZODone (DESYREL) 100 MG tablet TAKE 1 TABLET BY MOUTH AT BEDTIME Patient taking differently: Take 100 mg by mouth at bedtime. 11/09/20   Wardell Honour, MD     Physical Exam: Vitals:   01/12/21 0915 01/12/21 0930 01/12/21 0945 01/12/21 1045  BP: 125/69 116/74 113/67 117/77  Pulse: (!) 54 (!) 106 76 94  Resp: (!) 22 18 19 16   Temp:      TempSrc:      SpO2: 91% 97% 92% 92%   General:  Appears calm and comfortable and is in NAD Eyes:  PERRL, EOMI, normal lids, iris ENT:  grossly normal hearing, lips & tongue, mmm; edentulous Neck:  no LAD, masses or thyromegaly Cardiovascular:  Irregularly irregular with mild tachycardia, no m/r/g. No LE edema.  Respiratory:   CTA bilaterally with no wheezes/rales/rhonchi.  Normal respiratory effort. Abdomen:  soft, NT, ND Skin:  no rash or  induration seen on limited exam Musculoskeletal:  3-4/5 strength LUE with decreased grip strength, 4-5/5 on LLE Lower extremity:  No LE edema.  Limited foot exam with no ulcerations.  2+ distal pulses. Psychiatric:  blunted mood and affect, speech dysarthric but appropriate, AOx3 Neurologic:  L facial droop, decreased strength of LUE/LLE, reports sensation intact   Radiological Exams on Admission: Independently reviewed - see discussion in A/P where applicable  CT HEAD CODE STROKE WO CONTRAST  Result Date: 01/12/2021 CLINICAL DATA:  Code stroke.  Left-sided weakness EXAM: CT HEAD WITHOUT CONTRAST TECHNIQUE: Contiguous axial images were obtained from the base of the skull through the vertex without intravenous contrast. COMPARISON:  11/30/2020 FINDINGS: Brain: No acute intracranial hemorrhage, mass effect, or edema. New small area of low density with loss of gray-white differentiation in the more medial right precentral gyrus (series 4, image 25). Small chronic right posterior frontal infarct. Patchy and confluent hypoattenuation in the supratentorial white matter probably reflects stable chronic microvascular ischemic changes. Prominence of the ventricles and sulci reflects stable parenchymal volume loss. No extra-axial collection. Vascular: No definite hyperdense vessel.  Skull: Unremarkable. Sinuses/Orbits: No acute abnormality. Other: Mastoid air cells are clear. ASPECTS [facility] Stroke Program Early CT Score) - Ganglionic level infarction (caudate, lentiform nuclei, internal capsule, insula, M1-M3 cortex): 7 - Supraganglionic infarction (M4-M6 cortex): 2 Total score (0-10 with 10 being normal): 9 IMPRESSION: There is no acute intracranial hemorrhage. Suspect small acute cortical infarct involving the more medial right precentral gyrus. These results were communicated to Dr. Quinn Axe at 9:20 am on 01/12/2021 by text page via the [facility] messaging system. Electronically Signed   By: Macy Mis M.D.   On: 01/12/2021 09:27   CT ANGIO HEAD NECK W WO CM (CODE STROKE)  Result Date: 01/12/2021 CLINICAL DATA:  Neuro deficit, acute, stroke suspected. Left-sided weakness and aphasia. EXAM: CT ANGIOGRAPHY HEAD AND NECK TECHNIQUE: Multidetector CT imaging of the head and neck was performed using the standard protocol during bolus administration of intravenous contrast. Multiplanar CT image reconstructions and MIPs were obtained to evaluate the vascular anatomy. Carotid stenosis measurements (when applicable) are obtained utilizing NASCET criteria, using the distal internal carotid diameter as the denominator. CONTRAST:  50mL OMNIPAQUE IOHEXOL 350 MG/ML SOLN COMPARISON:  CTA head and neck 07/10/2020 FINDINGS: CTA NECK FINDINGS Aortic arch: Normal variant 4 vessel aortic arch with the left vertebral artery arising from the arch. Mild atherosclerotic plaque without significant arch vessel origin stenosis. Right carotid system: Patent with a small amount of calcified and soft plaque at the carotid bifurcation. No evidence of a significant stenosis or dissection. Left carotid system: Patent without evidence of stenosis or dissection. Vertebral arteries: Patent without evidence of a significant stenosis or dissection. Strongly dominant left vertebral artery. Skeleton: No acute osseous abnormality or  suspicious osseous lesion. Asymmetrically advanced left facet arthrosis at C2-3 and C3-4. 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 2.2 cm subcutaneous hypodense mass in the posterior neck slightly left of midline, likely an epidermal inclusion cyst or similar. Upper chest: Moderate centrilobular and paraseptal emphysema. Review of the MIP images confirms the above findings CTA HEAD FINDINGS Anterior circulation: The internal carotid arteries are widely patent from skull base to carotid termini with minimal nonstenotic plaque bilaterally. ACAs and MCAs are patent without evidence of a proximal branch occlusion or significant proximal stenosis. No aneurysm is identified. Posterior circulation: The intracranial left vertebral artery is patent and supplies the basilar, with mild V4 segment irregularity  but no significant stenosis. The right V4 segment is diminutive and poorly visualized distal to the PICA origin, unchanged. Patent bilateral PICA, left AICA, and bilateral SCA origins are visualized. There are large posterior communicating arteries with hypoplastic P1 segments bilaterally. Both PCAs are patent without evidence of a significant proximal stenosis. No aneurysm is identified. Venous sinuses: As permitted by contrast timing, patent. Anatomic variants: Hypoplastic right vertebral artery functionally terminating in PICA. Fetal type origins of the PCAs. Review of the MIP images confirms the above findings IMPRESSION: 1. No acute large vessel occlusion. 2. Mild atherosclerosis in the head and neck without a significant proximal stenosis. 3. Aortic Atherosclerosis (ICD10-I70.0) and Emphysema (ICD10-J43.9). Electronically Signed   By: Logan Bores M.D.   On: 01/12/2021 11:04    EKG: Independently reviewed.  Afib with rate 102; nonspecific ST changes with NSCSLT   Labs on Admission: I have personally reviewed the available labs and imaging studies at the time of the  admission.  Pertinent labs:    Glucose 133 Albumin 3.3 Unremarkable CBC A1c 5.4   Assessment/Plan * Acute CVA (cerebrovascular accident) ([place])- (present on admission) -Patient presenting with L-sided weakness and facial droop; has prior h/o CVA and has not been routinely taking medications including Eliquis -Concerning for TIA/CVA -ABCD2 score is 5 -tPA cannot be given since symptoms developed overnight, unknown duration -Aspirin has been given to reduce stroke mortality and decrease morbidity -Will admit for further CVA evaluation -Telemetry monitoring -MRI -CTA -Echo -Risk stratification with FLP; will also check TSH and UDS -Patient will need DAPT for 21 days when ABCD2 score is at least 4 and NIH score is 3 or less, and then can transition to monotherapy with a single antiplatelet agent.  Will defer to neurology for now. -Neurology consult -PT/OT/ST/Nutrition Consults -May need CIR  HTN -Allow permissive HTN for now -Treat BP only if >220/120, and then with goal of 15% reduction -He has not been taking his Toprol XL   HLD -Check FLP -Resume Lipitor 80 mg daily - he has not been taking this either  PAF (paroxysmal atrial fibrillation) ([place])- (present on admission) -Rate controlled with Toprol XL - but not taking this -Also not taking Eliquis -Will hold both for now  Acute urinary retention- (present on admission) -Patient has h/o BPH but denies issues with retention -He reports inability to void despite feeling the urge since his arrival in the ER -Will do bladder scan -May need foley if CVA and/or BPH is causing acute retention  Anxiety and depression- (present on admission) -Stopped taking his Celexa in addition to other medications -This was not restarted at the time of admission but may need to be resumed since medication noncompliance can be a symptom of depression  Subclinical hyperthyroidism- (present on admission) -Recheck TSH -Continue  methimazole  BPH (benign prostatic hyperplasia)- (present on admission) -Has h/o BPH, now with urinary retention as noted above -Resume Flomax - has not been taking this regularly either so this may be the cause of his retention  Cigarette nicotine dependence without complication- (present on admission) -Encourage cessation.   -This was discussed with the patient and should be reviewed on an ongoing basis.   -Patch ordered at patient request. -He smokes 2 PPD and this may be a challenge as an inpatient.  CAD (coronary artery disease)- (present on admission) -ASA for now due to CVA  Difficulty sleeping- (present on admission) -Continue trazodone -Reported not taking melatonin to med tech, but said he was taking this to  me; will hold for now    Advance Care Planning:   Code Status: Full Code   Consults: Neurology; PT/OT/ST/TOC team/Nutrition  Family Communication: I spoke with his sister-in-law at the time of admission.  She reports that he is highly noncompliant.  Severity of Illness: The appropriate patient status for this patient is INPATIENT. Inpatient status is judged to be reasonable and necessary in order to provide the required intensity of service to ensure the patient's safety. The patient's presenting symptoms, physical exam findings, and initial radiographic and laboratory data in the context of their chronic comorbidities is felt to place them at high risk for further clinical deterioration. Furthermore, it is not anticipated that the patient will be medically stable for discharge from the hospital within 2 midnights of admission.   * I certify that at the point of admission it is my clinical judgment that the patient will require inpatient hospital care spanning beyond 2 midnights from the point of admission due to high intensity of service, high risk for further deterioration and high frequency of surveillance required.*  Author: Karmen Bongo, MD 01/12/2021 11:27  AM  For on call review [email or web address].

## 2021-01-13 DIAGNOSIS — I639 Cerebral infarction, unspecified: Secondary | ICD-10-CM | POA: Diagnosis not present

## 2021-01-13 DIAGNOSIS — G8194 Hemiplegia, unspecified affecting left nondominant side: Secondary | ICD-10-CM | POA: Diagnosis not present

## 2021-01-13 DIAGNOSIS — R29810 Facial weakness: Secondary | ICD-10-CM | POA: Diagnosis not present

## 2021-01-13 LAB — LIPID PANEL
Cholesterol: 130 mg/dL (ref 0–200)
HDL: 34 mg/dL — ABNORMAL LOW (ref >40)
LDL Cholesterol: 81 mg/dL (ref 0–99)
Total CHOL/HDL Ratio: 3.8 RATIO
Triglycerides: 76 mg/dL (ref <150)
VLDL: 15 mg/dL (ref 0–40)

## 2021-01-13 MED ORDER — APIXABAN 5 MG PO TABS
5.0000 mg | ORAL_TABLET | Freq: Two times a day (BID) | ORAL | Status: DC
  Administered 2021-01-13: 5 mg via ORAL
  Filled 2021-01-13: qty 1

## 2021-01-13 MED ORDER — ASPIRIN EC 81 MG PO TBEC: 81.0000 mg | DELAYED_RELEASE_TABLET | Freq: Every day | ORAL | Status: DC

## 2021-01-13 MED ORDER — LORATADINE 10 MG PO TABS
10.0000 mg | ORAL_TABLET | Freq: Every day | ORAL | Status: DC
  Administered 2021-01-13: 10 mg via ORAL
  Filled 2021-01-13: qty 1

## 2021-01-13 NOTE — Progress Notes (Signed)
°   01/13/21 1405  TOC ED Mini Assessment  TOC Time spent with patient (minutes): 30  TOC Time saved using PING (minutes): 30  PING Used in TOC Assessment Yes  Admission or Readmission Diverted No  What brought you to the Emergency Department?  Stroke  Barriers to Discharge Continued Medical Work up  [employer] interventions Pt felt better, wanted to discharge home  Means of departure Car  Patient states their goals for this hospitalization and ongoing recovery are: go home  CMS Medicare.gov Compare Post Acute Care list provided to: Patient  Choice offered to / list presented to  Patient   Pt has been active with [facility] for [place] services as confirmed by [facility] with Tommi Rumps of [facility].  Pt will resume [facility] services of RN/PT/OT.  DME needs identified at this time include rolling walker.  Rolling walker delivered to pt room prior to discharge home.

## 2021-01-13 NOTE — ED Notes (Addendum)
Physician visited pt prior to going upstairs for  inpatient bed.  Patient declines admission, does not want arrangements made to go to rehab following hospital stay as recommended by PT?OT.  Dr Cruzita Lederer to discharge pt from ED instead of admitting.  Social work to be contacted for assistance with home care. Pt calling friend to arrange pickup to go home

## 2021-01-13 NOTE — Evaluation (Signed)
Occupational Therapy Evaluation Patient Details Name: David Hendricks MRN: [id number] DOB: [birth year] Today's Date: 01/13/2021   History of Present Illness Pt is a 71 y/o male admitted secondary to L weakness. 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 PMH includes CVA, CAD, HTN, a fib, and COPD.   Clinical Impression   Pt admitted with the above listed diagnosis and demonstrates the below listed deficits.  He currently requires set up assist - mod A for ADLs and min A for functional mobility.  Pt demonstrates significant cognitive deficits including poor awareness of deficits, poor safety awareness, impaired problem solving.  Pt also acknowledges difficulties with medication management at baseline.  Recommend SNF level rehab (pt declining this option), and he likely requires a higher level of care such as an ALF long term.  He is at high risk for readmission.        Recommendations for follow up therapy are one component of a multi-disciplinary discharge planning process, led by the attending physician.  Recommendations may be updated based on patient status, additional functional criteria and insurance authorization.   Follow Up Recommendations  Skilled nursing-short term rehab (<3 hours/day)    Assistance Recommended at Discharge Frequent or constant Supervision/Assistance  Patient can return home with the following A little help with walking and/or transfers;A little help with bathing/dressing/bathroom;Assistance with cooking/housework;Direct supervision/assist for medications management;Direct supervision/assist for financial management;Assist for transportation    Functional Status Assessment  Patient has had a recent decline in their functional status and demonstrates the ability to make significant improvements in function in a reasonable and predictable amount of time.  Equipment Recommendations  Tub/shower seat    Recommendations for Other Services        Precautions / Restrictions Precautions Precautions: Fall Restrictions Weight Bearing Restrictions: No      Mobility Bed Mobility Overal bed mobility: Needs Assistance Bed Mobility: Supine to Sit;Sit to Supine     Supine to sit: Min assist Sit to supine: Min assist   General bed mobility comments: Increased time and effort throughout. Poor sitting balance and with posterior lean in sitting. Difficulty maintaining sitting balance during dynamic tasks as well.    Transfers Overall transfer level: Needs assistance Equipment used: None Transfers: Sit to/from Stand Sit to Stand: Min assist           General transfer comment: Min A for steadying.      Balance Overall balance assessment: Needs assistance Sitting-balance support: Bilateral upper extremity supported Sitting balance-Leahy Scale: Poor Sitting balance - Comments: Reliant on UE and would lose balance posteriorly   Standing balance support: Bilateral upper extremity supported;No upper extremity supported Standing balance-Leahy Scale: Poor Standing balance comment: Reliant on UE and external support                           ADL either performed or assessed with clinical judgement   ADL Overall ADL's : Needs assistance/impaired Eating/Feeding: Set up;Sitting Eating/Feeding Details (indicate cue type and reason): Pt is Lt handed and demonstrates impaired coordination Grooming: Wash/dry hands;Wash/dry face;Oral care;Brushing hair;Set up;Supervision/safety;Sitting   Upper Body Bathing: Set up;Supervision/ safety;Sitting   Lower Body Bathing: Moderate assistance;Sit to/from stand Lower Body Bathing Details (indicate cue type and reason): unable to fully access feet and requires assist for standing balance Upper Body Dressing : Set up;Supervision/safety;Sitting Upper Body Dressing Details (indicate cue type and reason): for pull over shirt simulated Lower Body Dressing: Moderate assistance;Sit to/from  stand Lower Body Dressing Details (indicate cue type and reason): Pt with difficulty accessing feet to don socks, and requires assist for standing balance Toilet Transfer: Minimal assistance;+2 for safety/equipment;Ambulation;Comfort height toilet   Toileting- Clothing Manipulation and Hygiene: Minimal assistance;Sit to/from stand       Functional mobility during ADLs: Minimal assistance       Vision Baseline Vision/History: 1 Wears glasses Ability to See in Adequate Light: 0 Adequate Vision Assessment?: Yes Eye Alignment: Within Functional Limits Ocular Range of Motion: Within Functional Limits Alignment/Gaze Preference: Within Defined Limits Tracking/Visual Pursuits: Impaired - to be further tested in functional context Additional Comments: Pt distracted when attempting visual assessment.  He demonstrates full EOMs, but unable to perform pursuits accurately due to distractability     Perception     Praxis      Pertinent Vitals/Pain Pain Assessment: No/denies pain     Hand Dominance Left   Extremity/Trunk Assessment Upper Extremity Assessment Upper Extremity Assessment: LUE deficits/detail LUE Deficits / Details: shoulder 4/5; elbow 4+/5, grip 3+/5.  able to oppose digit 2 LUE Coordination: decreased fine motor;decreased gross motor   Lower Extremity Assessment Lower Extremity Assessment: Defer to PT evaluation LLE Deficits / Details: Grossly 4-/5 throughout. Difficulty sequencing to perform some parts of MMT.   Cervical / Trunk Assessment Cervical / Trunk Assessment: Kyphotic;Other exceptions Cervical / Trunk Exceptions: posterior lean in sitting   Communication Communication Communication: No difficulties   Cognition Arousal/Alertness: Awake/alert Behavior During Therapy: Restless Overall Cognitive Status: No family/caregiver present to determine baseline cognitive functioning                                 General Comments: Pt with very poor  safety awareness and poor insight into current deficits. Insistent he can go home without assist..  Pt somewhat impulsive, and requires cues for problem solving.     General Comments  frank discussion with pt re: current deficits and need for either assist at home or post acute rehab.  Pt fully denies deficits stating his sitting balance deficits are due to the hospital stretcher, and that he moves better at home.  Discussed fall risk, and implications associated.  Pt currently declining rehab    Exercises     Shoulder Instructions      Home Living Family/patient expects to be discharged to:: Private residence Living Arrangements: Alone Available Help at Discharge: Friend(s);Available PRN/intermittently Type of Home: Apartment Home Access: Level entry     Home Layout: One level     Bathroom Shower/Tub: Teacher, early years/pre: Standard     Home Equipment: Cane - single point      Lives With: Alone    Prior Functioning/Environment Prior Level of Function : Independent/Modified Independent;Driving             Mobility Comments: uses SPC to ambulate longer distances outside ADLs Comments: independent.  Has meals on wheels, Pt reports he manages his own medications, but hasn't been taking them right.        OT Problem List: Decreased strength;Decreased activity tolerance;Impaired balance (sitting and/or standing);Impaired vision/perception;Decreased cognition;Decreased knowledge of use of DME or AE;Decreased safety awareness;Impaired UE functional use      OT Treatment/Interventions:      OT Goals(Current goals can be found in the care plan section) Acute Rehab OT Goals Patient Stated Goal: to go home OT Goal Formulation: With patient Time For Goal Achievement:  (goals  not established due to pt discharge)  OT Frequency:      Co-evaluation   Reason for Co-Treatment: For patient/therapist safety;To address functional/ADL transfers PT goals addressed  during session: Mobility/safety with mobility;Balance        AM-PAC OT "6 Clicks" Daily Activity     Outcome Measure Help from another person eating meals?: A Little Help from another person taking care of personal grooming?: A Little Help from another person toileting, which includes using toliet, bedpan, or urinal?: A Little Help from another person bathing (including washing, rinsing, drying)?: A Lot Help from another person to put on and taking off regular upper body clothing?: A Little Help from another person to put on and taking off regular lower body clothing?: A Little 6 Click Score: 17   End of Session Equipment Utilized During Treatment: Gait belt Nurse Communication: Mobility status  Activity Tolerance: Patient tolerated treatment well Patient left: in bed;with call bell/phone within reach  OT Visit Diagnosis: Unsteadiness on feet (R26.81);Cognitive communication deficit (R41.841) Symptoms and signs involving cognitive functions: Cerebral infarction                Time: 1740-8144 OT Time Calculation (min): 25 min Charges:  OT General Charges $OT Visit: 1 Visit OT Evaluation $OT Eval Moderate Complexity: 1 Mod  Nilsa Nutting., OTR/L Acute Rehabilitation Services Pager [phone] Office [phone]   Lucille Passy M 01/13/2021, 3:09 PM

## 2021-01-13 NOTE — Evaluation (Signed)
Physical Therapy Evaluation Patient Details Name: David Hendricks MRN: [id number] DOB: [birth year] Today's Date: 01/13/2021  History of Present Illness  Pt is a 71 y/o male admitted secondary to L weakness. 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 PMH includes CVA, CAD, HTN, a fib, and COPD.  Clinical Impression  Pt admitted secondary to problem above with deficits below. Pt with poor safety awareness and poor insight into current deficits. Requiring min A for steadying throughout mobility tasks. Pt with LOB when turning and requiring mod A for stability. Feel pt is at increased risk for falls and currently lives alone. Recommending SNF level therapies, however, pt will likely refuse. If pt refuses, recommending max HH services. Will continue to follow acutely.        Recommendations for follow up therapy are one component of a multi-disciplinary discharge planning process, led by the attending physician.  Recommendations may be updated based on patient status, additional functional criteria and insurance authorization.  Follow Up Recommendations Skilled nursing-short term rehab (<3 hours/day)    Assistance Recommended at Discharge Frequent or constant Supervision/Assistance  Patient can return home with the following  A little help with walking and/or transfers;A little help with bathing/dressing/bathroom;Direct supervision/assist for financial management;Direct supervision/assist for medications management;Help with stairs or ramp for entrance    Equipment Recommendations Rolling walker (2 wheels)  Recommendations for Other Services       Functional Status Assessment Patient has had a recent decline in their functional status and demonstrates the ability to make significant improvements in function in a reasonable and predictable amount of time.     Precautions / Restrictions Precautions Precautions: Fall Restrictions Weight Bearing Restrictions: No      Mobility   Bed Mobility Overal bed mobility: Needs Assistance Bed Mobility: Supine to Sit;Sit to Supine     Supine to sit: Min assist Sit to supine: Min assist   General bed mobility comments: Increased time and effort throughout. Poor sitting balance and with posterior lean in sitting. Difficulty maintaining sitting balance during dynamic tasks as well.    Transfers Overall transfer level: Needs assistance Equipment used: None Transfers: Sit to/from Stand Sit to Stand: Min assist           General transfer comment: Min A for steadying.    Ambulation/Gait Ambulation/Gait assistance: Min assist;Mod assist;+2 physical assistance;+2 safety/equipment Gait Distance (Feet): 5 Feet Assistive device: 2 person hand held assist;None Gait Pattern/deviations: Step-through pattern;Decreased stride length Gait velocity: Decreased     General Gait Details: Min A for steadying. Very narrow BOS and decreased coordination when taking steps with LLE. Pt inisitent he could ambulate without assist and when turning, pt with LOB and required mod A for steadying.  Stairs            Wheelchair Mobility    Modified Rankin (Stroke Patients Only) Modified Rankin (Stroke Patients Only) Pre-Morbid Rankin Score: No significant disability Modified Rankin: Moderately severe disability     Balance Overall balance assessment: Needs assistance Sitting-balance support: Bilateral upper extremity supported Sitting balance-Leahy Scale: Poor Sitting balance - Comments: Reliant on UE and would lose balance posteriorly   Standing balance support: Bilateral upper extremity supported;No upper extremity supported Standing balance-Leahy Scale: Poor Standing balance comment: Reliant on UE and external support                             Pertinent Vitals/Pain Pain Assessment: No/denies pain  Home Living Family/patient expects to be discharged to:: Private residence Living Arrangements:  Alone Available Help at Discharge: Friend(s);Available PRN/intermittently Type of Home: Apartment Home Access: Level entry       Home Layout: One level Home Equipment: Cane - single point      Prior Function Prior Level of Function : Independent/Modified Independent;Driving             Mobility Comments: uses SPC to ambulate longer distances outside ADLs Comments: independent.  Has meals on wheels, Pt reports he manages his own medications, but hasn't been taking them right.     Hand Dominance   Dominant Hand: Left    Extremity/Trunk Assessment   Upper Extremity Assessment Upper Extremity Assessment: Defer to OT evaluation    Lower Extremity Assessment Lower Extremity Assessment: LLE deficits/detail LLE Deficits / Details: Grossly 4-/5 throughout. Difficulty sequencing to perform some parts of MMT.    Cervical / Trunk Assessment Cervical / Trunk Assessment: Kyphotic;Other exceptions Cervical / Trunk Exceptions: posterior lean in sitting  Communication   Communication: No difficulties  Cognition Arousal/Alertness: Awake/alert Behavior During Therapy: Restless Overall Cognitive Status: No family/caregiver present to determine baseline cognitive functioning                                 General Comments: Pt with very poor safety awareness and poor insight into current deficits. Insistent he can go home without assist. Functional Status Assessment: Patient has had a recent decline in their functional status and demonstrates the ability to make significant improvements in function in a reasonable and predictable amount of time.      General Comments General comments (skin integrity, edema, etc.): Lengthy discussion about current deficits and recommendations for SNF and pt likely to refuse    Exercises     Assessment/Plan    PT Assessment Patient needs continued PT services  PT Problem List Decreased strength;Decreased balance;Decreased activity  tolerance;Decreased mobility;Decreased cognition;Decreased knowledge of use of DME;Decreased knowledge of precautions;Decreased safety awareness       PT Treatment Interventions DME instruction;Gait training;Therapeutic activities;Functional mobility training;Balance training;Therapeutic exercise;Patient/family education    PT Goals (Current goals can be found in the Care Plan section)  Acute Rehab PT Goals Patient Stated Goal: to go home PT Goal Formulation: With patient Time For Goal Achievement: 01/27/21 Potential to Achieve Goals: Fair    Frequency Min 3X/week     Co-evaluation PT/OT/SLP Co-Evaluation/Treatment: Yes Reason for Co-Treatment: For patient/therapist safety;To address functional/ADL transfers PT goals addressed during session: Mobility/safety with mobility;Balance         AM-PAC PT "6 Clicks" Mobility  Outcome Measure Help needed turning from your back to your side while in a flat bed without using bedrails?: A Little Help needed moving from lying on your back to sitting on the side of a flat bed without using bedrails?: A Little Help needed moving to and from a bed to a chair (including a wheelchair)?: A Little Help needed standing up from a chair using your arms (e.g., wheelchair or bedside chair)?: A Little Help needed to walk in hospital room?: A Lot Help needed climbing 3-5 steps with a railing? : Total 6 Click Score: 15    End of Session Equipment Utilized During Treatment: Gait belt Activity Tolerance: Patient tolerated treatment well Patient left: in bed;with call bell/phone within reach (on stretcher in ED) Nurse Communication: Mobility status PT Visit Diagnosis: Unsteadiness on feet (R26.81);Muscle weakness (generalized) (M62.81)  Time: 0165-5374 PT Time Calculation (min) (ACUTE ONLY): 25 min   Charges:   PT Evaluation $PT Eval Moderate Complexity: 1 Mod          Reuel Derby, PT, DPT  Acute Rehabilitation Services  Pager: [phone] Office: [phone]   Rudean Hitt 01/13/2021, 2:29 PM

## 2021-01-13 NOTE — Discharge Summary (Signed)
Physician Discharge Summary  David Hendricks QIO:[id number] DOB: 1950/02/11 DOA: 01/12/2021  PCP: Wardell Honour, MD  Admit date: 01/12/2021 Discharge date: 01/13/2021  Admitted From: home Disposition:  home, refusing SNF  Recommendations for Outpatient Follow-up:  Follow up with PCP in 1-2 weeks  Home Health: PT, OT, aide Equipment/Devices: walker  Discharge Condition: stable CODE STATUS: Full code Diet recommendation: heart healthy  HPI: Per admitting MD, David Hendricks is a 71 y.o. male with medical history significant of CAD; COPD; BPH; HLD; hypothyroidism; afib on Eliquis; and CVA presenting with L-sided weakness, slurred speech, facial droop.  He reports that he has not been taking his medications regularly for the last few weeks.  He periodically takes Eliquis, does not take metoprolol.  He has a prior h/o CVA in February.  He awoke this AM with L facial droop, mild dysarthria, LUE numbness/tingling, decreased grip strength, and LLE weakness/stiffness.  He reports nasal congestion.  He also notes inability to void since arrival but feeling the need to.  Hospital Course / Discharge diagnoses: Principal problem Acute CVA -patient was admitted to the hospital with left-sided weakness as well as facial droop.  He has a history of prior CVAs in the setting of paroxysmal A. fib and medication nonadherence.  This time he reports that he has been missing his medication, and not taking it regularly.  He states that he does not really know what to take at times, he used to have a list but has misplaced it.  He also reports concern about taking too many medications and having interactions.  MRI on admission showed few small acute cortical-based infarct to the mid to posterior right frontal lobe.  Neurology consulted and followed patient while hospitalized.  CTA without LVO.  Underwent a 2D echo which showed an EF of 40-55% with global hypokinesis.  RV was mildly reduced as well.  Lipid panel showed an  LDL of 81, he is on statin.  Neurology recommends to continue regimen as below with emphasis on compliance.  Physical therapy recommends SNF however patient is adamant that he will go home.  Home health has been arranged prior to discharge.  Active problems Essential hypertension-permissive hypertension for now, gradually normalized 3 to 5 days Hyperlipidemia-continue statin Paroxysmal A. Fib -continue medications including Eliquis Urinary retention-resolved Anxiety/depression-continue home medications Subclinical hyperthyroidism-continue methimazole CAD-no chest pain, stable, continue aspirin Tobacco abuse-smokes 2 packs/day, he is not ready to quit   Sepsis ruled out   Discharge Instructions   Allergies as of 01/13/2021   No Known Allergies      Medication List     TAKE these medications    acetaminophen 500 MG tablet Commonly known as: TYLENOL Take 1,000 mg by mouth every 6 (six) hours as needed for mild pain.   aspirin EC 81 MG tablet Take 1 tablet (81 mg total) by mouth daily. Swallow whole.   atorvastatin 80 MG tablet Commonly known as: LIPITOR Take 1 tablet (80 mg total) by mouth daily.   Baclofen 5 MG Tabs Take 5 mg by mouth 4 (four) times daily.   citalopram 20 MG tablet Commonly known as: CELEXA Take 1 tablet (20 mg total) by mouth daily.   Eliquis 5 MG Tabs tablet Generic drug: apixaban TAKE 1 TABLET(5 MG) BY MOUTH TWICE DAILY What changed: See the new instructions.   gabapentin 100 MG capsule Commonly known as: NEURONTIN Take 100 mg by mouth 3 (three) times daily as needed (pain).   melatonin 3 MG Tabs tablet  Take 1 tablet (3 mg total) by mouth at bedtime.   methimazole 5 MG tablet Commonly known as: TAPAZOLE TAKE 1 TABLET(5 MG) BY MOUTH DAILY What changed:  how much to take how to take this when to take this additional instructions   metoprolol succinate 25 MG 24 hr tablet Commonly known as: Toprol XL Take 1 tablet (25 mg total) by mouth  daily.   tamsulosin 0.4 MG Caps capsule Commonly known as: FLOMAX Take 2 capsules (0.8 mg total) by mouth daily after supper.   traZODone 100 MG tablet Commonly known as: DESYREL TAKE 1 TABLET BY MOUTH AT BEDTIME               Durable Medical Equipment  (From admission, onward)           Start     Ordered   01/13/21 1328  For home use only DME Walker rolling  Once       Question Answer Comment  Walker: With 5 Inch Wheels   Patient needs a walker to treat with the following condition Stroke (cerebrum) ([place])      01/13/21 1327             Consultations: Neurology   Procedures/Studies:  CT Angio Chest PE W and/or Wo Contrast  Result Date: 12/31/2020 CLINICAL DATA:  Shortness of breath, pulmonary embolism suspected. EXAM: CT ANGIOGRAPHY CHEST WITH CONTRAST TECHNIQUE: Multidetector CT imaging of the chest was performed using the standard protocol during bolus administration of intravenous contrast. Multiplanar CT image reconstructions and MIPs were obtained to evaluate the vascular anatomy. CONTRAST:  57mL OMNIPAQUE IOHEXOL 350 MG/ML SOLN COMPARISON:  None. FINDINGS: Cardiovascular: Satisfactory opacification of the pulmonary arteries to the segmental level. No evidence of pulmonary embolism. Normal heart size. Extensive coronary artery atherosclerotic disease. No pericardial effusion. Mediastinum/Nodes: Enlarged thyroid with intrathoracic left thyroid lobe. No bulky lymphadenopathy. Multiple shotty lymph nodes in the right paratracheal region and mediastinum. Lungs/Pleura: Moderate centrilobular emphysematous changes. No evidence of pneumonia or pulmonary edema. 5 mm solid nodule in the right upper lobe (series 6, image 76). 8 mm nodule in the lingula, slightly increased in size (previously measured 7 mm. Upper Abdomen: No acute abnormality. Musculoskeletal: No chest wall abnormality. No acute or significant osseous findings. Review of the MIP images confirms the above  findings. IMPRESSION: 1. No evidence of pulmonary embolism. 2. 8 mm nodule in the lingula and 5 mm nodule in the right upper lobe. The lingular nodule has not significantly changed since prior examination (measured 7 mm on prior examination of November 2020). Follow-up examination in 6-12 months is recommended. 3. Emphysematous changes. No evidence of pneumonia or pulmonary edema. 4. Enlarged left thyroid lobe, thyroid sonogram for further evaluation is recommended. Electronically Signed   By: Keane Police D.O.   On: 12/31/2020 10:43   MR BRAIN WO CONTRAST  Result Date: 01/12/2021 CLINICAL DATA:  Neuro deficit, acute, stroke suspected. Additional history provided: Left-sided weakness, slurred speech, facial droop. EXAM: MRI HEAD WITHOUT CONTRAST TECHNIQUE: Multiplanar, multiecho pulse sequences of the brain and surrounding structures were obtained without intravenous contrast. COMPARISON:  Noncontrast head CT and CT angiogram head/neck performed earlier today 01/12/2021. Brain MRI 08/19/2020 FINDINGS: Brain: Mild-to-moderate intermittent motion degradation. Mild-to-moderate generalized cerebral atrophy. As before, the right cerebellar hemisphere is asymmetrically small. There are a few small acute cortically-based infarcts within the mid to posterior right frontal lobe. Notably, some of these infarcts involve the precentral gyrus. A punctate acute infarct is also questioned within the  left parietooccipital lobes (series 2, image 34). Known small chronic cortical infarcts within the right frontoparietal lobes and anterior left frontal lobe. Redemonstrated chronic small-vessel infarcts within the right cerebral hemisphere white matter and right basal ganglia/internal capsule. Moderately advanced patchy and confluent T2 FLAIR hyperintense signal abnormality within the cerebral white matter, nonspecific but compatible with chronic small vessel ischemic disease. Redemonstrated small chronic infarcts within the  bilateral cerebellar hemispheres. No evidence of an intracranial mass. No extra-axial fluid collection. No midline shift. Vascular: Severe stenosis or occlusion of the V4 right vertebral artery, better appreciated on the same-day CTA. Flow voids otherwise preserved within the proximal large arterial vessels. Skull and upper cervical spine: No focal suspicious marrow lesion is identified. Sinuses/Orbits: Visualized orbits show no acute finding. Mild mucosal thickening within the left maxillary sinus at the imaged levels. IMPRESSION: Motion degraded examination. There are a few small acute cortically-based infarcts within the mid-to-posterior right frontal lobe (right MCA vascular territory). Notably, some of these infarcts involve the precentral gyrus. A punctate acute infarct is also questioned within the left parietooccipital lobes Known small chronic cortical infarcts within the right frontoparietal lobes and anterior left frontal lobe. Moderately advanced chronic small vessel ischemic disease with multiple chronic small-vessel infarcts, as detailed. Redemonstrated small chronic infarcts within the bilateral cerebellar hemispheres. Mild-to-moderate generalized cerebral atrophy. Additionally, the right cerebellar hemisphere is asymmetrically small. Electronically Signed   By: Kellie Simmering D.O.   On: 01/12/2021 11:56   DG Chest Port 1 View  Result Date: 12/31/2020 CLINICAL DATA:  71 year old male with shortness of breath, weakness and back pain. EXAM: PORTABLE CHEST 1 VIEW COMPARISON:  Portable chest 11/30/2020 and earlier. FINDINGS: Portable AP supine view at 0516 hours. Left chest cardiac loop recorder or lead less ICD redemonstrated. Stable lung volumes. Normal cardiac size and mediastinal contours. Pulmonary interstitial markings appear chronic, not significantly changed. No pneumothorax, pleural effusion or confluent pulmonary opacity. Visualized tracheal air column is within normal limits. No acute  osseous abnormality identified. IMPRESSION: No acute cardiopulmonary abnormality. Electronically Signed   By: Genevie [name] M.D.   On: 12/31/2020 06:32   ECHOCARDIOGRAM COMPLETE  Result Date: 01/12/2021    ECHOCARDIOGRAM REPORT   Patient Name:   NICKLOUS ABURTO Date of Exam: 01/12/2021 Medical Rec #:  [id number]  Height:       73.0 in Accession #:    [id number] Weight:       216.0 lb Date of Birth:  05-23-1950  BSA:          2.223 m Patient Age:    49 years   BP:           103/65 mmHg Patient Gender: M          HR:           81 bpm. Exam Location:  Inpatient Procedure: 2D Echo, Cardiac Doppler and Color Doppler Indications:    stroke  History:        Patient has no prior history of Echocardiogram examinations. CAD                 and Previous Myocardial Infarction, Stroke and COPD; Risk                 Factors:Dyslipidemia. GERDS.  Sonographer:    Beryle Beams Referring Phys: [place]  1. Technically difficult; pt in atrial fibrillation at time of study; LV function appears to be mildly reduced.  2. Left ventricular ejection fraction, by estimation, is  40 to 45%. The left ventricle has mildly decreased function. The left ventricle demonstrates global hypokinesis. Left ventricular diastolic function could not be evaluated.  3. Right ventricular systolic function is mildly reduced. The right ventricular size is mildly enlarged.  4. The mitral valve is normal in structure. Trivial mitral valve regurgitation. No evidence of mitral stenosis.  5. The aortic valve is tricuspid. Aortic valve regurgitation is trivial. Aortic valve sclerosis is present, with no evidence of aortic valve stenosis.  6. The inferior vena cava is normal in size with greater than 50% respiratory variability, suggesting right atrial pressure of 3 mmHg. FINDINGS  Left Ventricle: Left ventricular ejection fraction, by estimation, is 40 to 45%. The left ventricle has mildly decreased function. The left ventricle demonstrates global  hypokinesis. The left ventricular internal cavity size was normal in size. There is  no left ventricular hypertrophy. Left ventricular diastolic function could not be evaluated due to atrial fibrillation. Left ventricular diastolic function could not be evaluated. Right Ventricle: The right ventricular size is mildly enlarged. Right ventricular systolic function is mildly reduced. Left Atrium: Left atrial size was normal in size. Right Atrium: Right atrial size was normal in size. Pericardium: There is no evidence of pericardial effusion. Mitral Valve: The mitral valve is normal in structure. Trivial mitral valve regurgitation. No evidence of mitral valve stenosis. Tricuspid Valve: The tricuspid valve is normal in structure. Tricuspid valve regurgitation is mild . No evidence of tricuspid stenosis. Aortic Valve: The aortic valve is tricuspid. Aortic valve regurgitation is trivial. Aortic valve sclerosis is present, with no evidence of aortic valve stenosis. Aortic valve mean gradient measures 3.0 mmHg. Aortic valve peak gradient measures 4.4 mmHg. Pulmonic Valve: The pulmonic valve was not well visualized. Pulmonic valve regurgitation is not visualized. No evidence of pulmonic stenosis. Aorta: The aortic root is normal in size and structure. Venous: The inferior vena cava is normal in size with greater than 50% respiratory variability, suggesting right atrial pressure of 3 mmHg. IAS/Shunts: The interatrial septum was not well visualized. Additional Comments: Technically difficult; pt in atrial fibrillation at time of study; LV function appears to be mildly reduced.  LEFT VENTRICLE PLAX 2D LVIDd:         4.26 cm     Diastology LVIDs:         3.46 cm     LV e' medial:  4.79 cm/s LV PW:         1.34 cm     LV e' lateral: 4.13 cm/s LV IVS:        0.77 cm LVOT diam:     2.00 cm LV SV:         32 LV SV Index:   15 LVOT Area:     3.14 cm  LV Volumes (MOD) LV vol d, MOD A2C: 65.3 ml LV vol d, MOD A4C: 82.9 ml LV vol s, MOD  A2C: 28.7 ml LV vol s, MOD A4C: 39.1 ml LV SV MOD A2C:     36.6 ml LV SV MOD A4C:     82.9 ml LV SV MOD BP:      40.8 ml RIGHT VENTRICLE RV S prime:     13.50 cm/s TAPSE (M-mode): 1.6 cm LEFT ATRIUM           Index        RIGHT ATRIUM           Index LA diam:      5.00 cm 2.25 cm/m  RA Area:     23.80 cm 9.27 cm/m LA Vol (A4C): 65.7 ml 29.55 ml/m  AORTIC VALVE                    PULMONIC VALVE AV Area (Vmax):    1.72 cm     PV Vmax:       0.46 m/s AV Area (Vmean):   1.52 cm     PV Peak grad:  0.9 mmHg AV Area (VTI):     1.34 cm AV Vmax:           105.00 cm/s AV Vmean:          75.400 cm/s AV VTI:            0.241 m AV Peak Grad:      4.4 mmHg AV Mean Grad:      3.0 mmHg LVOT Vmax:         57.50 cm/s LVOT Vmean:        36.500 cm/s LVOT VTI:          0.103 m LVOT/AV VTI ratio: 0.43  AORTA Ao Root diam: 2.60 cm Ao STJ diam:  0.2 cm TRICUSPID VALVE TR Peak grad:   24.6 mmHg TR Vmax:        248.00 cm/s  SHUNTS Systemic VTI:  0.10 m Systemic Diam: 2.00 cm Kirk Ruths MD Electronically signed by Kirk Ruths MD Signature Date/Time: 01/12/2021/4:37:14 PM    Final    CUP PACEART REMOTE DEVICE CHECK  Result Date: 12/27/2020 ILR summary report received. Battery status OK. Normal device function. No new symptom, tachy, brady, or pause episodes. No new AF episodes. Monthly summary reports and ROV/PRN LR  CT HEAD CODE STROKE WO CONTRAST  Result Date: 01/12/2021 CLINICAL DATA:  Code stroke.  Left-sided weakness EXAM: CT HEAD WITHOUT CONTRAST TECHNIQUE: Contiguous axial images were obtained from the base of the skull through the vertex without intravenous contrast. COMPARISON:  11/30/2020 FINDINGS: Brain: No acute intracranial hemorrhage, mass effect, or edema. New small area of low density with loss of gray-white differentiation in the more medial right precentral gyrus (series 4, image 25). Small chronic right posterior frontal infarct. Patchy and confluent hypoattenuation in the supratentorial white matter  probably reflects stable chronic microvascular ischemic changes. Prominence of the ventricles and sulci reflects stable parenchymal volume loss. No extra-axial collection. Vascular: No definite hyperdense vessel. Skull: Unremarkable. Sinuses/Orbits: No acute abnormality. Other: Mastoid air cells are clear. ASPECTS [facility] Stroke Program Early CT Score) - Ganglionic level infarction (caudate, lentiform nuclei, internal capsule, insula, M1-M3 cortex): 7 - Supraganglionic infarction (M4-M6 cortex): 2 Total score (0-10 with 10 being normal): 9 IMPRESSION: There is no acute intracranial hemorrhage. Suspect small acute cortical infarct involving the more medial right precentral gyrus. These results were communicated to Dr. Quinn Axe at 9:20 am on 01/12/2021 by text page via the [facility] messaging system. Electronically Signed   By: Macy Mis M.D.   On: 01/12/2021 09:27   CT ANGIO HEAD NECK W WO CM (CODE STROKE)  Result Date: 01/12/2021 CLINICAL DATA:  Neuro deficit, acute, stroke suspected. Left-sided weakness and aphasia. EXAM: CT ANGIOGRAPHY HEAD AND NECK TECHNIQUE: Multidetector CT imaging of the head and neck was performed using the standard protocol during bolus administration of intravenous contrast. Multiplanar CT image reconstructions and MIPs were obtained to evaluate the vascular anatomy. Carotid stenosis measurements (when applicable) are obtained utilizing NASCET criteria, using the distal internal carotid diameter as the denominator. CONTRAST:  80mL OMNIPAQUE  IOHEXOL 350 MG/ML SOLN COMPARISON:  CTA head and neck 07/10/2020 FINDINGS: CTA NECK FINDINGS Aortic arch: Normal variant 4 vessel aortic arch with the left vertebral artery arising from the arch. Mild atherosclerotic plaque without significant arch vessel origin stenosis. Right carotid system: Patent with a small amount of calcified and soft plaque at the carotid bifurcation. No evidence of a significant stenosis or dissection. Left carotid system:  Patent without evidence of stenosis or dissection. Vertebral arteries: Patent without evidence of a significant stenosis or dissection. Strongly dominant left vertebral artery. Skeleton: No acute osseous abnormality or suspicious osseous lesion. Asymmetrically advanced left facet arthrosis at C2-3 and C3-4. 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 2.2 cm subcutaneous hypodense mass in the posterior neck slightly left of midline, likely an epidermal inclusion cyst or similar. Upper chest: Moderate centrilobular and paraseptal emphysema. Review of the MIP images confirms the above findings CTA HEAD FINDINGS Anterior circulation: The internal carotid arteries are widely patent from skull base to carotid termini with minimal nonstenotic plaque bilaterally. ACAs and MCAs are patent without evidence of a proximal branch occlusion or significant proximal stenosis. No aneurysm is identified. Posterior circulation: The intracranial left vertebral artery is patent and supplies the basilar, with mild V4 segment irregularity but no significant stenosis. The right V4 segment is diminutive and poorly visualized distal to the PICA origin, unchanged. Patent bilateral PICA, left AICA, and bilateral SCA origins are visualized. There are large posterior communicating arteries with hypoplastic P1 segments bilaterally. Both PCAs are patent without evidence of a significant proximal stenosis. No aneurysm is identified. Venous sinuses: As permitted by contrast timing, patent. Anatomic variants: Hypoplastic right vertebral artery functionally terminating in PICA. Fetal type origins of the PCAs. Review of the MIP images confirms the above findings IMPRESSION: 1. No acute large vessel occlusion. 2. Mild atherosclerosis in the head and neck without a significant proximal stenosis. 3. Aortic Atherosclerosis (ICD10-I70.0) and Emphysema (ICD10-J43.9). Electronically Signed   By: Logan Bores M.D.   On:  01/12/2021 11:04     Subjective: - no chest pain, shortness of breath, no abdominal pain, nausea or vomiting.   Discharge Exam: BP 118/74    Pulse 75    Temp 98.2 F (36.8 C) (Oral)    Resp 20    SpO2 94%   General: Pt is alert, awake, not in acute distress Cardiovascular: RRR, S1/S2 +, no rubs, no gallops Respiratory: CTA bilaterally, no wheezing, no rhonchi Abdominal: Soft, NT, ND, bowel sounds + Extremities: no edema, no cyanosis    The results of significant diagnostics from this hospitalization (including imaging, microbiology, ancillary and laboratory) are listed below for reference.     Microbiology: Recent Results (from the past 240 hour(s))  Resp Panel by RT-PCR (Flu A&B, Covid) Nasopharyngeal Swab     Status: None   Collection Time: 01/12/21 11:30 AM   Specimen: Nasopharyngeal Swab; Nasopharyngeal(NP) swabs in vial transport medium  Result Value Ref Range Status   SARS Coronavirus 2 by RT PCR NEGATIVE NEGATIVE Final    Comment: (NOTE) SARS-CoV-2 target nucleic acids are NOT DETECTED.  The SARS-CoV-2 RNA is generally detectable in upper respiratory specimens during the acute phase of infection. The lowest concentration of SARS-CoV-2 viral copies this assay can detect is 138 copies/mL. A negative result does not preclude SARS-Cov-2 infection and should not be used as the sole basis for treatment or other patient management decisions. A negative result may occur with  improper specimen collection/handling, submission  of specimen other than nasopharyngeal swab, presence of viral mutation(s) within the areas targeted by this assay, and inadequate number of viral copies(<138 copies/mL). A negative result must be combined with clinical observations, patient history, and epidemiological information. The expected result is Negative.  Fact Sheet for Patients:  EntrepreneurPulse.com.au  Fact Sheet for Healthcare Providers:   IncredibleEmployment.be  This test is no t yet approved or cleared by the [place] FDA and  has been authorized for detection and/or diagnosis of SARS-CoV-2 by FDA under an Emergency Use Authorization (EUA). This EUA will remain  in effect (meaning this test can be used) for the duration of the COVID-19 declaration under Section 564(b)(1) of the Act, 21 U.S.C.section 360bbb-3(b)(1), unless the authorization is terminated  or revoked sooner.       Influenza A by PCR NEGATIVE NEGATIVE Final   Influenza B by PCR NEGATIVE NEGATIVE Final    Comment: (NOTE) The Xpert Xpress SARS-CoV-2/FLU/RSV plus assay is intended as an aid in the diagnosis of influenza from Nasopharyngeal swab specimens and should not be used as a sole basis for treatment. Nasal washings and aspirates are unacceptable for Xpert Xpress SARS-CoV-2/FLU/RSV testing.  Fact Sheet for Patients: EntrepreneurPulse.com.au  Fact Sheet for Healthcare Providers: IncredibleEmployment.be  This test is not yet approved or cleared by the [place] FDA and has been authorized for detection and/or diagnosis of SARS-CoV-2 by FDA under an Emergency Use Authorization (EUA). This EUA will remain in effect (meaning this test can be used) for the duration of the COVID-19 declaration under Section 564(b)(1) of the Act, 21 U.S.C. section 360bbb-3(b)(1), unless the authorization is terminated or revoked.  Performed at [facility], [place] [address], [place], [place] [address]      Labs: Basic Metabolic Panel: Recent Labs  Lab 01/12/21 0900 01/12/21 0914  NA 137 140  K 3.8 4.0  CL 104 103  CO2 26  --   GLUCOSE 133* 129*  BUN 11 13  CREATININE 1.14 1.00  CALCIUM 9.1  --    Liver Function Tests: Recent Labs  Lab 01/12/21 0900  AST 20  ALT 14  ALKPHOS 96  BILITOT 0.9  PROT 5.9*  ALBUMIN 3.3*   CBC: Recent Labs  Lab 01/12/21 0900 01/12/21 0914  WBC  8.7  --   NEUTROABS 5.4  --   HGB 17.6* 18.0*  HCT 52.4* 53.0*  MCV 86.3  --   PLT 145*  --    CBG: Recent Labs  Lab 01/12/21 0853  GLUCAP 147*   Hgb A1c Recent Labs    01/12/21 0900  HGBA1C 5.4   Lipid Profile Recent Labs    01/12/21 0900 01/13/21 0400  CHOL  --  130  HDL  --  34*  LDLCALC  --  81  TRIG  --  76  CHOLHDL  --  3.8  LDLDIRECT 89.3  --    Thyroid function studies Recent Labs    01/12/21 2040  TSH 0.143*   Urinalysis    Component Value Date/Time   COLORURINE YELLOW 07/10/2020 0944   APPEARANCEUR CLEAR 07/10/2020 0944   APPEARANCEUR Clear 08/08/2016 0900   LABSPEC 1.015 07/10/2020 0944   PHURINE 5.0 07/10/2020 [place] 07/10/2020 0944   HGBUR NEGATIVE 07/10/2020 0944   BILIRUBINUR NEGATIVE 07/10/2020 0944   BILIRUBINUR negative 05/02/2018 1337   BILIRUBINUR Negative 08/08/2016 0900   KETONESUR NEGATIVE 07/10/2020 0944   PROTEINUR NEGATIVE 07/10/2020 0944   UROBILINOGEN 0.2 05/02/2018 1337  NITRITE NEGATIVE 07/10/2020 0944   LEUKOCYTESUR NEGATIVE 07/10/2020 0944    FURTHER DISCHARGE INSTRUCTIONS:   Get Medicines reviewed and adjusted: Please take all your medications with you for your next visit with your Primary MD   Laboratory/radiological data: Please request your Primary MD to go over all hospital tests and procedure/radiological results at the follow up, please ask your Primary MD to get all Hospital records sent to his/her office.   In some cases, they will be blood work, cultures and biopsy results pending at the time of your discharge. Please request that your primary care M.D. goes through all the records of your hospital data and follows up on these results.   Also Note the following: If you experience worsening of your admission symptoms, develop shortness of breath, life threatening emergency, suicidal or homicidal thoughts you must seek medical attention immediately by calling 911 or calling your MD  immediately  if symptoms less severe.   You must read complete instructions/literature along with all the possible adverse reactions/side effects for all the Medicines you take and that have been prescribed to you. Take any new Medicines after you have completely understood and accpet all the possible adverse reactions/side effects.    Do not drive when taking Pain medications or sleeping medications (Benzodaizepines)   Do not take more than prescribed Pain, Sleep and Anxiety Medications. It is not advisable to combine anxiety,sleep and pain medications without talking with your primary care practitioner   Special Instructions: If you have smoked or chewed Tobacco  in the last 2 yrs please stop smoking, stop any regular Alcohol  and or any Recreational drug use.   Wear Seat belts while driving.   Please note: You were cared for by a hospitalist during your hospital stay. Once you are discharged, your primary care physician will handle any further medical issues. Please note that NO REFILLS for any discharge medications will be authorized once you are discharged, as it is imperative that you return to your primary care physician (or establish a relationship with a primary care physician if you do not have one) for your post hospital discharge needs so that they can reassess your need for medications and monitor your lab values.  Time coordinating discharge: 45 minutes  SIGNED:  Marzetta Board, MD, PhD 01/13/2021, 1:52 PM

## 2021-01-13 NOTE — ED Notes (Signed)
Pt  requests to see administrator , charge nurse and social worker made aware.

## 2021-01-13 NOTE — Evaluation (Signed)
Speech Language Pathology Evaluation Patient Details Name: David Hendricks MRN: [id number] DOB: 1950-09-30 Today's Date: 01/13/2021 Time: 7124-5809 SLP Time Calculation (min) (ACUTE ONLY): 30 min  Problem List:  Patient Active Problem List   Diagnosis Date Noted   Acute CVA (cerebrovascular accident) ([place]) 01/12/2021   Acute urinary retention 01/12/2021   Essential hypertension 01/12/2021   PAF (paroxysmal atrial fibrillation) ([place]) 01/12/2021   Gait disturbance, post-stroke 12/01/2020   Right middle cerebral artery stroke ([place]) 02/24/2020   Dysphagia following cerebrovascular accident 02/17/2020   Dysarthria due to recent cerebral infarction 02/17/2020   Obesity 02/16/2020   Hyperbilirubinemia 02/16/2020   CVA (cerebral vascular accident) ([place]) 02/15/2020   Thyroid mass 02/15/2020   Anxiety and depression 02/15/2020   Pulmonary emphysema ([place]) 06/02/2019   Aortic atherosclerosis ([place]) 06/02/2019   Smokers' cough ([place]) 06/02/2019   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   Multinodular goiter 12/24/2018   Subclinical hyperthyroidism 12/24/2018   BPH (benign prostatic hyperplasia) 11/20/2018   Pulmonary nodules 11/15/2018   Esophageal thickening 11/15/2018   Cigarette nicotine dependence without complication 98/33/8250   Old MI (myocardial infarction) 01/30/2017   CAD (coronary artery disease) 01/30/2017   Hyperlipidemia 10/10/2016   History of non-ST elevation myocardial infarction (NSTEMI) 10/09/2016   Tobacco abuse 08/28/2016   Difficulty sleeping 04/22/2016   Benign prostatic hyperplasia with urinary frequency 02/11/2016   Past Medical History:  Past Medical History:  Diagnosis Date   Aortic atherosclerosis (HCC)    Arthritis    Benign prostatic hyperplasia (BPH) with urinary urgency    COPD (chronic obstructive pulmonary disease) ([place])    Coronary artery disease    10/18 PCI/DES to pLAD, mild nonobstructive disease in the Lcx/RCA. Normal EF.    Depression with anxiety     Dyslipidemia    Dysrhythmia 2016   irregular heartbeat   Gallstones    GERD (gastroesophageal reflux disease)    History of kidney stones    Hyperthyroidism    Leg cramps    Multiple pulmonary nodules    Myocardial infarction ([place])    2018   Pneumonia    Seasonal allergies    Stroke ([place])    Vitamin B 12 deficiency    Past Surgical History:  Past Surgical History:  Procedure Laterality Date   COLONOSCOPY     CORONARY ANGIOPLASTY  10/09/2016   CORONARY STENT INTERVENTION N/A 10/09/2016   Procedure: CORONARY STENT INTERVENTION;  Surgeon: Nelva Bush, MD;  Location: [facility];  Service: Cardiovascular;  Laterality: N/A;   CYSTOSCOPY WITH RETROGRADE PYELOGRAM, URETEROSCOPY AND STENT PLACEMENT Left 06/17/2019   Procedure: CYSTOSCOPY WITH RETROGRADE PYELOGRAM, URETEROSCOPY AND STENT PLACEMENT;  Surgeon: Robley Fries, MD;  Location: [facility];  Service: Urology;  Laterality: Left;  1 HR   HOLMIUM LASER APPLICATION Left 05/11/9765   Procedure: HOLMIUM LASER APPLICATION;  Surgeon: Robley Fries, MD;  Location: [facility];  Service: Urology;  Laterality: Left;   INTRAVASCULAR ULTRASOUND/IVUS N/A 10/09/2016   Procedure: Intravascular Ultrasound/IVUS;  Surgeon: Nelva Bush, MD;  Location: [facility];  Service: Cardiovascular;  Laterality: N/A;   IR CHOLANGIOGRAM EXISTING TUBE  11/18/2018   IR PERC CHOLECYSTOSTOMY  11/16/2018   LEFT HEART CATH AND CORONARY ANGIOGRAPHY N/A 10/09/2016   Procedure: LEFT HEART CATH AND CORONARY ANGIOGRAPHY;  Surgeon: Nelva Bush, MD;  Location: [facility];  Service: Cardiovascular;  Laterality: N/A;   LOOP RECORDER INSERTION N/A 02/20/2020   Procedure: LOOP RECORDER INSERTION;  Surgeon: Vickie Epley, MD;  Location[facility]  INVASIVE [facility];  Service: Cardiovascular;  Laterality: N/A;   HPI:  David Hendricks is a 71 y.o. male with medical history significant of CAD; COPD; BPH; HLD; hypothyroidism; afib on Eliquis; and CVA presenting with  L-sided weakness, slurred speech, facial droop.  He reports that he has not been taking his medications regularly for the last few weeks.  He periodically takes Eliquis, does not take metoprolol.  He has a prior h/o CVA in February.  He awoke this AM with L facial droop, mild dysarthria, LUE numbness/tingling, decreased grip strength, and LLE weakness/stiffness.  MRI head 01/12/21 indicated Motion degraded examination.     There are a few small acute cortically-based infarcts within the  mid-to-posterior right frontal lobe (right MCA vascular territory).  Notably, some of these infarcts involve the precentral gyrus.     A punctate acute infarct is also questioned within the left  parietooccipital lobes     Known small chronic cortical infarcts within the right  frontoparietal lobes and anterior left frontal lobe.     Moderately advanced chronic small vessel ischemic disease with  multiple chronic small-vessel infarcts, as detailed.     Redemonstrated small chronic infarcts within the bilateral  cerebellar hemispheres.     Mild-to-moderate generalized cerebral atrophy. Additionally, the  right cerebellar hemisphere is asymmetrically small.  Speech/language evaluation ordered.   Assessment / Plan / Recommendation Clinical Impression  Pt seen for speech/language assessment with SLUMS administered ([place] Mental Status Examination) with a score obtained of 18/26 with deficits noted within the areas of attention and memory primarily, but question awareness and problem solving as well.  Pt with prior CVAs, so without family present, it was difficult to determine prior level of functioning.  Pt had difficulty with tasks such as repeating digits backwards, simple calculation and recall of objects after a time delay.  Speech was min dysarthric, but pt has been missing dentures for over 2 years and speech was judged to be 90% intelligible within sentences-conversation.  Some naming tasks (convergent/divergent)  were challenging for patient, but memory deficits noted at baseline, so may be from prior function rather than new issue.  Writing not assessed d/t dominant hand affected with legibility compromised.  Reading for environmental signs adequate, but full reading assessment not completed. Recommend ST f/u while in acute setting for cognitive/linguistic and speech deficits and f/u recommended at next venue of care.  Thank you for this consult.    SLP Assessment  SLP Recommendation/Assessment: Patient needs continued Speech Language Pathology Services SLP Visit Diagnosis: Dysarthria and anarthria (R47.1);Attention and concentration deficit;Cognitive communication deficit (R41.841) Attention and concentration deficit following: Cerebral infarction    Recommendations for follow up therapy are one component of a multi-disciplinary discharge planning process, led by the attending physician.  Recommendations may be updated based on patient status, additional functional criteria and insurance authorization.    Follow Up Recommendations  Follow physician's recommendations for discharge plan and follow up therapies    Assistance Recommended at Discharge  Intermittent Supervision/Assistance  Functional Status Assessment Patient has had a recent decline in their functional status and demonstrates the ability to make significant improvements in function in a reasonable and predictable amount of time.  Frequency and Duration min 1 x/week  1 week      SLP Evaluation Cognition  Overall Cognitive Status: No family/caregiver present to determine baseline cognitive functioning Arousal/Alertness: Awake/alert Orientation Level: Oriented X4 Year: 2023 Month: January Day of Week: Correct Attention: Sustained Sustained Attention: Impaired Sustained Attention  Impairment: Verbal basic;Functional basic Memory: Impaired Memory Impairment: Retrieval deficit;Decreased short term memory;Decreased recall of new  information Decreased Short Term Memory: Verbal basic;Functional basic Immediate Memory Recall: Sock;Blue;Bed Memory Recall Sock: Without Cue Memory Recall Blue: Without Cue Memory Recall Bed: With Cue Awareness: Impaired Awareness Impairment: Anticipatory impairment Problem Solving: Impaired Problem Solving Impairment: Verbal basic;Functional basic Executive Function: Writer: Impaired Organizing Impairment: Verbal basic;Functional basic Behaviors: Perseveration Safety/Judgment: Impaired Comments: Pt stated he felt like the could go home without rehab; question safety awareness       Comprehension  Auditory Comprehension Overall Auditory Comprehension: Appears within functional limits for tasks assessed Yes/No Questions: Within Functional Limits Commands: Within Functional Limits Conversation: Complex Interfering Components: Attention;Working Field seismologist: Repetition Retail banker: Not tested Reading Comprehension Reading Status: Not tested    Expression Expression Primary Mode of Expression: Verbal Verbal Expression Overall Verbal Expression: Impaired Level of Generative/Spontaneous Verbalization: Conversation Repetition: No impairment Naming: Impairment Responsive: 76-100% accurate Confrontation: Within functional limits Convergent: 50-74% accurate Divergent: 50-74% accurate Other Naming Comments: perseverative Verbal Errors: Perseveration Interfering Components: Attention Non-Verbal Means of Communication: Not applicable Written Expression Dominant Hand: Left Written Expression: Not tested   Oral / Motor  Oral Motor/Sensory Function Overall Oral Motor/Sensory Function: Mild impairment Facial ROM: Reduced left Facial Symmetry: Abnormal symmetry left (slight at rest) Lingual ROM: Within Functional Limits Motor Speech Overall Motor Speech: Appears within functional limits for tasks  assessed Respiration: Within functional limits Phonation: Normal Resonance: Within functional limits Articulation: Impaired Level of Impairment: Conversation Intelligibility: Intelligibility reduced Word: 75-100% accurate Phrase: 75-100% accurate Sentence: 75-100% accurate Conversation: 75-100% accurate Motor Planning: Witnin functional limits Motor Speech Errors: Not applicable Effective Techniques: Pacing;Slow rate            Elvina Sidle, M.S., CCC-SLP 01/13/2021, 1:14 PM

## 2021-01-13 NOTE — ED Notes (Signed)
PT/OT eval in progress at bedside

## 2021-01-13 NOTE — ED Notes (Signed)
ST at bedside

## 2021-01-13 NOTE — ED Notes (Signed)
Patient is resting comfortably. 

## 2021-01-13 NOTE — Progress Notes (Addendum)
STROKE TEAM PROGRESS NOTE   INTERVAL HISTORY No family is at the bedside.  Patient states that he has not been taking his Eliquis or metoprolol consistently because he was started on more pain medicine and sometimes he just ends up missing meds.  In the past he has had a list but he lost it and now he has trouble keeping up with his medications.  Patient is hemodynamically stable. Mild dysarthria is noted.  Awaiting PT OT evaluation.  SLP cognitive eval suggests that he may need intermittent supervision and assistance at home. MRI scan shows a tiny right MCA branch embolic infarcts.  CT angiogram of brain and neck did not show significant large vessel stenosis or occlusion.  Echocardiogram shows ejection fraction of 40 to 45% but no definite clot. Vitals:   01/13/21 0415 01/13/21 0545 01/13/21 0600 01/13/21 0800  BP:   116/80 122/84  Pulse: 91 85  (!) 102  Resp: 18 20 (!) 24 18  Temp:    98.2 F (36.8 C)  TempSrc:    Oral  SpO2: 90% 93%  95%   CBC:  Recent Labs  Lab 01/12/21 0900 01/12/21 0914  WBC 8.7  --   NEUTROABS 5.4  --   HGB 17.6* 18.0*  HCT 52.4* 53.0*  MCV 86.3  --   PLT 145*  --    Basic Metabolic Panel:  Recent Labs  Lab 01/12/21 0900 01/12/21 0914  NA 137 140  K 3.8 4.0  CL 104 103  CO2 26  --   GLUCOSE 133* 129*  BUN 11 13  CREATININE 1.14 1.00  CALCIUM 9.1  --    Lipid Panel:  Recent Labs  Lab 01/13/21 0400  CHOL 130  TRIG 76  HDL 34*  CHOLHDL 3.8  VLDL 15  LDLCALC 81   HgbA1c:  Recent Labs  Lab 01/12/21 0900  HGBA1C 5.4   Urine Drug Screen:  Recent Labs  Lab 01/12/21 2155  LABOPIA NONE DETECTED  COCAINSCRNUR NONE DETECTED  LABBENZ NONE DETECTED  AMPHETMU NONE DETECTED  THCU NONE DETECTED  LABBARB NONE DETECTED    Alcohol Level No results for input(s): ETH in the last 168 hours.  IMAGING past 24 hours MR BRAIN WO CONTRAST  Result Date: 01/12/2021 CLINICAL DATA:  Neuro deficit, acute, stroke suspected. Additional history provided:  Left-sided weakness, slurred speech, facial droop. EXAM: MRI HEAD WITHOUT CONTRAST TECHNIQUE: Multiplanar, multiecho pulse sequences of the brain and surrounding structures were obtained without intravenous contrast. COMPARISON:  Noncontrast head CT and CT angiogram head/neck performed earlier today 01/12/2021. Brain MRI 08/19/2020 FINDINGS: Brain: Mild-to-moderate intermittent motion degradation. Mild-to-moderate generalized cerebral atrophy. As before, the right cerebellar hemisphere is asymmetrically small. There are a few small acute cortically-based infarcts within the mid to posterior right frontal lobe. Notably, some of these infarcts involve the precentral gyrus. A punctate acute infarct is also questioned within the left parietooccipital lobes (series 2, image 34). Known small chronic cortical infarcts within the right frontoparietal lobes and anterior left frontal lobe. Redemonstrated chronic small-vessel infarcts within the right cerebral hemisphere white matter and right basal ganglia/internal capsule. Moderately advanced patchy and confluent T2 FLAIR hyperintense signal abnormality within the cerebral white matter, nonspecific but compatible with chronic small vessel ischemic disease. Redemonstrated small chronic infarcts within the bilateral cerebellar hemispheres. No evidence of an intracranial mass. No extra-axial fluid collection. No midline shift. Vascular: Severe stenosis or occlusion of the V4 right vertebral artery, better appreciated on the same-day CTA. Flow voids otherwise preserved  within the proximal large arterial vessels. Skull and upper cervical spine: No focal suspicious marrow lesion is identified. Sinuses/Orbits: Visualized orbits show no acute finding. Mild mucosal thickening within the left maxillary sinus at the imaged levels. IMPRESSION: Motion degraded examination. There are a few small acute cortically-based infarcts within the mid-to-posterior right frontal lobe (right MCA  vascular territory). Notably, some of these infarcts involve the precentral gyrus. A punctate acute infarct is also questioned within the left parietooccipital lobes Known small chronic cortical infarcts within the right frontoparietal lobes and anterior left frontal lobe. Moderately advanced chronic small vessel ischemic disease with multiple chronic small-vessel infarcts, as detailed. Redemonstrated small chronic infarcts within the bilateral cerebellar hemispheres. Mild-to-moderate generalized cerebral atrophy. Additionally, the right cerebellar hemisphere is asymmetrically small. Electronically Signed   By: Kellie Simmering D.O.   On: 01/12/2021 11:56   ECHOCARDIOGRAM COMPLETE  Result Date: 01/12/2021    ECHOCARDIOGRAM REPORT   Patient Name:   David Hendricks Date of Exam: 01/12/2021 Medical Rec #:  [id number]  Height:       73.0 in Accession #:    [id number] Weight:       216.0 lb Date of Birth:  Oct 25, 1950  BSA:          2.223 m Patient Age:    71 years   BP:           103/65 mmHg Patient Gender: M          HR:           81 bpm. Exam Location:  Inpatient Procedure: 2D Echo, Cardiac Doppler and Color Doppler Indications:    stroke  History:        Patient has no prior history of Echocardiogram examinations. CAD                 and Previous Myocardial Infarction, Stroke and COPD; Risk                 Factors:Dyslipidemia. GERDS.  Sonographer:    Beryle Beams Referring Phys: [place]  1. Technically difficult; pt in atrial fibrillation at time of study; LV function appears to be mildly reduced.  2. Left ventricular ejection fraction, by estimation, is 40 to 45%. The left ventricle has mildly decreased function. The left ventricle demonstrates global hypokinesis. Left ventricular diastolic function could not be evaluated.  3. Right ventricular systolic function is mildly reduced. The right ventricular size is mildly enlarged.  4. The mitral valve is normal in structure. Trivial mitral valve  regurgitation. No evidence of mitral stenosis.  5. The aortic valve is tricuspid. Aortic valve regurgitation is trivial. Aortic valve sclerosis is present, with no evidence of aortic valve stenosis.  6. The inferior vena cava is normal in size with greater than 50% respiratory variability, suggesting right atrial pressure of 3 mmHg. FINDINGS  Left Ventricle: Left ventricular ejection fraction, by estimation, is 40 to 45%. The left ventricle has mildly decreased function. The left ventricle demonstrates global hypokinesis. The left ventricular internal cavity size was normal in size. There is  no left ventricular hypertrophy. Left ventricular diastolic function could not be evaluated due to atrial fibrillation. Left ventricular diastolic function could not be evaluated. Right Ventricle: The right ventricular size is mildly enlarged. Right ventricular systolic function is mildly reduced. Left Atrium: Left atrial size was normal in size. Right Atrium: Right atrial size was normal in size. Pericardium: There is no evidence of pericardial effusion. Mitral Valve: The mitral valve  is normal in structure. Trivial mitral valve regurgitation. No evidence of mitral valve stenosis. Tricuspid Valve: The tricuspid valve is normal in structure. Tricuspid valve regurgitation is mild . No evidence of tricuspid stenosis. Aortic Valve: The aortic valve is tricuspid. Aortic valve regurgitation is trivial. Aortic valve sclerosis is present, with no evidence of aortic valve stenosis. Aortic valve mean gradient measures 3.0 mmHg. Aortic valve peak gradient measures 4.4 mmHg. Pulmonic Valve: The pulmonic valve was not well visualized. Pulmonic valve regurgitation is not visualized. No evidence of pulmonic stenosis. Aorta: The aortic root is normal in size and structure. Venous: The inferior vena cava is normal in size with greater than 50% respiratory variability, suggesting right atrial pressure of 3 mmHg. IAS/Shunts: The interatrial  septum was not well visualized. Additional Comments: Technically difficult; pt in atrial fibrillation at time of study; LV function appears to be mildly reduced.  LEFT VENTRICLE PLAX 2D LVIDd:         4.26 cm     Diastology LVIDs:         3.46 cm     LV e' medial:  4.79 cm/s LV PW:         1.34 cm     LV e' lateral: 4.13 cm/s LV IVS:        0.77 cm LVOT diam:     2.00 cm LV SV:         32 LV SV Index:   15 LVOT Area:     3.14 cm  LV Volumes (MOD) LV vol d, MOD A2C: 65.3 ml LV vol d, MOD A4C: 82.9 ml LV vol s, MOD A2C: 28.7 ml LV vol s, MOD A4C: 39.1 ml LV SV MOD A2C:     36.6 ml LV SV MOD A4C:     82.9 ml LV SV MOD BP:      40.8 ml RIGHT VENTRICLE RV S prime:     13.50 cm/s TAPSE (M-mode): 1.6 cm LEFT ATRIUM           Index        RIGHT ATRIUM           Index LA diam:      5.00 cm 2.25 cm/m   RA Area:     23.80 cm 9.27 cm/m LA Vol (A4C): 65.7 ml 29.55 ml/m  AORTIC VALVE                    PULMONIC VALVE AV Area (Vmax):    1.72 cm     PV Vmax:       0.46 m/s AV Area (Vmean):   1.52 cm     PV Peak grad:  0.9 mmHg AV Area (VTI):     1.34 cm AV Vmax:           105.00 cm/s AV Vmean:          75.400 cm/s AV VTI:            0.241 m AV Peak Grad:      4.4 mmHg AV Mean Grad:      3.0 mmHg LVOT Vmax:         57.50 cm/s LVOT Vmean:        36.500 cm/s LVOT VTI:          0.103 m LVOT/AV VTI ratio: 0.43  AORTA Ao Root diam: 2.60 cm Ao STJ diam:  0.2 cm TRICUSPID VALVE TR Peak grad:   24.6 mmHg TR Vmax:  248.00 cm/s  SHUNTS Systemic VTI:  0.10 m Systemic Diam: 2.00 cm Kirk Ruths MD Electronically signed by Kirk Ruths MD Signature Date/Time: 01/12/2021/4:37:14 PM    Final    CT HEAD CODE STROKE WO CONTRAST  Result Date: 01/12/2021 CLINICAL DATA:  Code stroke.  Left-sided weakness EXAM: CT HEAD WITHOUT CONTRAST TECHNIQUE: Contiguous axial images were obtained from the base of the skull through the vertex without intravenous contrast. COMPARISON:  11/30/2020 FINDINGS: Brain: No acute intracranial hemorrhage,  mass effect, or edema. New small area of low density with loss of gray-white differentiation in the more medial right precentral gyrus (series 4, image 25). Small chronic right posterior frontal infarct. Patchy and confluent hypoattenuation in the supratentorial white matter probably reflects stable chronic microvascular ischemic changes. Prominence of the ventricles and sulci reflects stable parenchymal volume loss. No extra-axial collection. Vascular: No definite hyperdense vessel. Skull: Unremarkable. Sinuses/Orbits: No acute abnormality. Other: Mastoid air cells are clear. ASPECTS [facility] Stroke Program Early CT Score) - Ganglionic level infarction (caudate, lentiform nuclei, internal capsule, insula, M1-M3 cortex): 7 - Supraganglionic infarction (M4-M6 cortex): 2 Total score (0-10 with 10 being normal): 9 IMPRESSION: There is no acute intracranial hemorrhage. Suspect small acute cortical infarct involving the more medial right precentral gyrus. These results were communicated to Dr. Quinn Axe at 9:20 am on 01/12/2021 by text page via the [facility] messaging system. Electronically Signed   By: Macy Mis M.D.   On: 01/12/2021 09:27   CT ANGIO HEAD NECK W WO CM (CODE STROKE)  Result Date: 01/12/2021 CLINICAL DATA:  Neuro deficit, acute, stroke suspected. Left-sided weakness and aphasia. EXAM: CT ANGIOGRAPHY HEAD AND NECK TECHNIQUE: Multidetector CT imaging of the head and neck was performed using the standard protocol during bolus administration of intravenous contrast. Multiplanar CT image reconstructions and MIPs were obtained to evaluate the vascular anatomy. Carotid stenosis measurements (when applicable) are obtained utilizing NASCET criteria, using the distal internal carotid diameter as the denominator. CONTRAST:  55mL OMNIPAQUE IOHEXOL 350 MG/ML SOLN COMPARISON:  CTA head and neck 07/10/2020 FINDINGS: CTA NECK FINDINGS Aortic arch: Normal variant 4 vessel aortic arch with the left vertebral artery arising  from the arch. Mild atherosclerotic plaque without significant arch vessel origin stenosis. Right carotid system: Patent with a small amount of calcified and soft plaque at the carotid bifurcation. No evidence of a significant stenosis or dissection. Left carotid system: Patent without evidence of stenosis or dissection. Vertebral arteries: Patent without evidence of a significant stenosis or dissection. Strongly dominant left vertebral artery. Skeleton: No acute osseous abnormality or suspicious osseous lesion. Asymmetrically advanced left facet arthrosis at C2-3 and C3-4. 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 2.2 cm subcutaneous hypodense mass in the posterior neck slightly left of midline, likely an epidermal inclusion cyst or similar. Upper chest: Moderate centrilobular and paraseptal emphysema. Review of the MIP images confirms the above findings CTA HEAD FINDINGS Anterior circulation: The internal carotid arteries are widely patent from skull base to carotid termini with minimal nonstenotic plaque bilaterally. ACAs and MCAs are patent without evidence of a proximal branch occlusion or significant proximal stenosis. No aneurysm is identified. Posterior circulation: The intracranial left vertebral artery is patent and supplies the basilar, with mild V4 segment irregularity but no significant stenosis. The right V4 segment is diminutive and poorly visualized distal to the PICA origin, unchanged. Patent bilateral PICA, left AICA, and bilateral SCA origins are visualized. There are large posterior communicating arteries with hypoplastic P1 segments  bilaterally. Both PCAs are patent without evidence of a significant proximal stenosis. No aneurysm is identified. Venous sinuses: As permitted by contrast timing, patent. Anatomic variants: Hypoplastic right vertebral artery functionally terminating in PICA. Fetal type origins of the PCAs. Review of the MIP images confirms the  above findings IMPRESSION: 1. No acute large vessel occlusion. 2. Mild atherosclerosis in the head and neck without a significant proximal stenosis. 3. Aortic Atherosclerosis (ICD10-I70.0) and Emphysema (ICD10-J43.9). Electronically Signed   By: Logan Bores M.D.   On: 01/12/2021 11:04    PHYSICAL EXAM  Physical Exam  Constitutional: Appears well-developed and well-nourished.  Psych: Affect appropriate to situation Cardiovascular: Normal rate and regular rhythm.  Respiratory: Effort normal, non-labored breathing  Neuro: Mental Status: Patient is awake, alert, oriented to person, place, month, year, and situation. Mild dysarthria, no signs of aphasia, repetition intact No signs of aphasia or neglect Cranial Nerves: II: Visual Fields are full. Pupils are equal, round, and reactive to light.   III,IV, VI: EOMI without ptosis or diploplia.  V: Facial sensation is symmetric to temperature VII: Facial movement is symmetric resting and smiling VIII: Hearing is intact to voice X: Palate elevates symmetrically XI: Shoulder shrug is symmetric. XII: Tongue protrudes midline without atrophy or fasciculations.  Motor: Tone is normal. Bulk is normal. 5/5 strength was present in all four extremities.  Sensory: Sensation is symmetric to light touch and temperature in the arms and legs. No extinction to DSS present.  Deep Tendon Reflexes: 2+ and symmetric in the biceps and patellae.  Coordination: FNF and HKS are intact bilaterally  ASSESSMENT/PLAN David Hendricks is a 71 y.o. male with history of CAD; COPD; BPH; HLD; hypothyroidism; afib on Eliquis; and CVA presenting with L-sided weakness, slurred speech, facial droop.  He reports that he has not been taking his medications regularly for the last few weeks.  He periodically takes Eliquis, does not take metoprolol.  He has a prior h/o CVA in February.  He awoke this AM with L facial droop, mild dysarthria, LUE numbness/tingling, decreased grip  strength, and LLE weakness/stiffness.  He reports nasal congestion.  He also notes inability to void since arrival but feeling the need to. Marland Kitchen He did have his last dose of Eliquis on 01/11/2021.TNK not administered 2/2 presentation outside the window and contraindication of therapeutic anticoagulation. Patient has hx prior stroke with residual incoordination and numbness in L hand.  MRI shows a mid to posterior right frontal lobe infarct in the MCA territory.  LDL is 81, hemoglobin A1c is 5.4.  Restart Eliquis 5 mg twice a day and aspirin 81 mg daily.  Stroke: Acute cortical infarcts in mid-posterior frontal lobe (MCA territory) likely secondary to atrial fibrillation not on anticoagulation.   Code Stroke- Suspect small acute cortical infarct involving the more medial right precentral gyrus. CTA head & neck fetal origin of PCAs.  Hypoplastic right vertebral artery, No LVO, mild atherosclerosis in head and neck MRI  acute cortical infarcts within mid to posterior rt frontal lobe.  2D Echo EF 40 to 45%, global hypokinesis. LDL 81 HgbA1c 5.4 VTE prophylaxis -Lovenox Eliquis (apixaban) daily and ASA 81mg  daily prior to admission, now on aspirin 81 mg daily and Eliquis (apixaban) daily. (Inconsistent with medications prior to admission) Therapy recommendations: Pending Disposition: Pending  Atrial Fibrillation Prescribed Eliquis 5 mg twice daily and metoprolol 25 mg daily Reviewed importance of taking medications as prescribed with patient.   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, concerning for embolic pattern, source unclear  Other Stroke Risk  Factors Advanced Age >/= 59  Cigarette smoker, advised to stop smoking Hx stroke/TIA Coronary artery disease w/ Stent Placement Continue ASA 81mg  as recommended by cardiology from prior stroke  Other Active Problems Insomnia Trazodone 100 mg at bedtime Melatonin 3 mg Depression  citalopram 20 mg Acute urinary retention- (present on admission) Patient has h/o BPH but denies issues with retention He reports inability to void despite feeling the urge since his arrival in the ER Hyperthyroidism Continue methimazole  Hospital day # 1  Patient seen and examined by NP/APP with MD. MD to update note as needed.   Janine Ores, DNP, FNP-BC [facility] Pager: [phone]  STROKE MD NOTE:  I have personally obtained history,examined this patient, reviewed notes, independently viewed imaging studies, participated in medical decision making and plan of care.ROS completed by me personally and pertinent positives fully documented  I have made any additions or clarifications directly to the above note. Agree with note above.  Patient presented with embolic right MCA branch infarct secondary to A. fib while not on anticoagulation.  He was counseled to be compliant with taking his Eliquis regularly and seems agreeable.  Aggressive risk factor modification.  Patient was counseled to quit smoking but he is refusing.  Patient will be discharged home.  Follow-up as an outpatient in the stroke clinic in 2 months electively.  Greater than 50% time during the 35-minute visit was spent in counseling and coordination of care and discussion of atrial fibrillation and stroke risk and stroke prevention and treatment and answering questions.  Discussed with Dr.Gherghe.  Antony Contras, MD Medical Director [facility] Pager: [phone] 01/13/2021 3:49 PM   To contact Stroke Continuity provider, please refer to [email or web address] After hours, contact General Neurology

## 2021-01-13 NOTE — ED Notes (Signed)
Went in to find pt restless, has pulled monitors off and is complaining of back pain. Monitors reapplied. Assisted pt to reposition and pain med given.

## 2021-01-13 NOTE — Discharge Planning (Signed)
RNCM consulted regarding pt wanting to talk to administrator, transportation and decline rolling walker.  RNCM listened to pt concerns with food (only given spoon to cut meat and did not ask for knife), could not reach call bell (did not realize it was looped around rail of bed for easy access) and being "told to go to skilled nursing facility" by attending (attending asked this RN to offer choice- pt chose Home with Home Health).  Pt really liked nurses who cared for him as he said they were "great."  RNCM consulted regarding transportation needs for pt.  RNCM provided [employer] as pt has no transportation from hospital.Pt has utilized this form of transportation in the past and has a Clinical research associate.  Pt declined rolling walker as he "gets around just fine with out it!"  RNCM canceled delivery of rolling walker.  No additional TOC needs identified at this time.  Tegh Franek J. Clydene Laming, RN, BSN, NCM  Transitions of Care   Nurse Case Manager  [facility] Emergency Departments   Operative Services  [phone]

## 2021-01-14 ENCOUNTER — Telehealth: Payer: Self-pay | Admitting: *Deleted

## 2021-01-14 DIAGNOSIS — F331 Major depressive disorder, recurrent, moderate: Secondary | ICD-10-CM | POA: Diagnosis not present

## 2021-01-14 NOTE — Telephone Encounter (Signed)
Transition Care Management Follow-up Telephone Call Date of discharge and from where: 01/13/2021 [place] How have you been since you were released from the hospital? Better but terrible time in the hospital.  Any questions or concerns? No  Items Reviewed: Did the pt receive and understand the discharge instructions provided? Yes  Medications obtained and verified? Yes  Other? No  Any new allergies since your discharge? No  Dietary orders reviewed? Yes Do you have support at home? Yes   Home Care and Equipment/Supplies: Were home health services ordered? yes If so, what is the name of the agency? [facility]  Has the agency set up a time to come to the patient's home? yes Were any new equipment or medical supplies ordered?  No What is the name of the medical supply agency? [facility] Were you able to get the supplies/equipment? not applicable Do you have any questions related to the use of the equipment or supplies? No  Functional Questionnaire: (I = Independent and D = Dependent) ADLs: I  Bathing/Dressing- I  Meal Prep- I  Eating- I  Maintaining continence- I  Transferring/Ambulation- I  Managing Meds- I  Follow up appointments reviewed:  PCP Hospital f/u appt confirmed? Yes  Scheduled to see Dinah on 01/17/2021 @ 2:15. [place] [facility] f/u appt confirmed? No   Are transportation arrangements needed? No  If their condition worsens, is the pt aware to call PCP or go to the Emergency Dept.? Yes Was the patient provided with contact information for the PCP's office or ED? Yes Was to pt encouraged to call back with questions or concerns? Yes

## 2021-01-17 ENCOUNTER — Encounter: Payer: Medicare Other | Admitting: Family

## 2021-01-20 ENCOUNTER — Other Ambulatory Visit: Payer: Self-pay

## 2021-01-20 ENCOUNTER — Encounter: Payer: Self-pay | Admitting: Orthopedic Surgery

## 2021-01-20 ENCOUNTER — Telehealth (INDEPENDENT_AMBULATORY_CARE_PROVIDER_SITE_OTHER): Payer: Medicare Other | Admitting: Orthopedic Surgery

## 2021-01-20 DIAGNOSIS — I252 Old myocardial infarction: Secondary | ICD-10-CM | POA: Diagnosis not present

## 2021-01-20 DIAGNOSIS — Z79899 Other long term (current) drug therapy: Secondary | ICD-10-CM | POA: Diagnosis not present

## 2021-01-20 DIAGNOSIS — E039 Hypothyroidism, unspecified: Secondary | ICD-10-CM | POA: Diagnosis not present

## 2021-01-20 DIAGNOSIS — R350 Frequency of micturition: Secondary | ICD-10-CM

## 2021-01-20 DIAGNOSIS — Z7982 Long term (current) use of aspirin: Secondary | ICD-10-CM | POA: Diagnosis not present

## 2021-01-20 DIAGNOSIS — J302 Other seasonal allergic rhinitis: Secondary | ICD-10-CM | POA: Diagnosis not present

## 2021-01-20 DIAGNOSIS — I69354 Hemiplegia and hemiparesis following cerebral infarction affecting left non-dominant side: Secondary | ICD-10-CM | POA: Diagnosis not present

## 2021-01-20 DIAGNOSIS — M1990 Unspecified osteoarthritis, unspecified site: Secondary | ICD-10-CM | POA: Diagnosis not present

## 2021-01-20 DIAGNOSIS — J449 Chronic obstructive pulmonary disease, unspecified: Secondary | ICD-10-CM | POA: Diagnosis not present

## 2021-01-20 DIAGNOSIS — I700 Atherosclerosis of aorta: Secondary | ICD-10-CM | POA: Diagnosis not present

## 2021-01-20 DIAGNOSIS — R112 Nausea with vomiting, unspecified: Secondary | ICD-10-CM | POA: Diagnosis not present

## 2021-01-20 DIAGNOSIS — R918 Other nonspecific abnormal finding of lung field: Secondary | ICD-10-CM | POA: Diagnosis not present

## 2021-01-20 DIAGNOSIS — E785 Hyperlipidemia, unspecified: Secondary | ICD-10-CM | POA: Diagnosis not present

## 2021-01-20 DIAGNOSIS — G479 Sleep disorder, unspecified: Secondary | ICD-10-CM | POA: Diagnosis not present

## 2021-01-20 DIAGNOSIS — N401 Enlarged prostate with lower urinary tract symptoms: Secondary | ICD-10-CM

## 2021-01-20 DIAGNOSIS — F418 Other specified anxiety disorders: Secondary | ICD-10-CM | POA: Diagnosis not present

## 2021-01-20 DIAGNOSIS — I69392 Facial weakness following cerebral infarction: Secondary | ICD-10-CM | POA: Diagnosis not present

## 2021-01-20 DIAGNOSIS — F17210 Nicotine dependence, cigarettes, uncomplicated: Secondary | ICD-10-CM | POA: Diagnosis not present

## 2021-01-20 DIAGNOSIS — E538 Deficiency of other specified B group vitamins: Secondary | ICD-10-CM | POA: Diagnosis not present

## 2021-01-20 DIAGNOSIS — I2510 Atherosclerotic heart disease of native coronary artery without angina pectoris: Secondary | ICD-10-CM | POA: Diagnosis not present

## 2021-01-20 DIAGNOSIS — I10 Essential (primary) hypertension: Secondary | ICD-10-CM | POA: Diagnosis not present

## 2021-01-20 DIAGNOSIS — I480 Paroxysmal atrial fibrillation: Secondary | ICD-10-CM | POA: Diagnosis not present

## 2021-01-20 DIAGNOSIS — I639 Cerebral infarction, unspecified: Secondary | ICD-10-CM

## 2021-01-20 DIAGNOSIS — Z955 Presence of coronary angioplasty implant and graft: Secondary | ICD-10-CM | POA: Diagnosis not present

## 2021-01-20 DIAGNOSIS — E0580 Other thyrotoxicosis without thyrotoxic crisis or storm: Secondary | ICD-10-CM | POA: Diagnosis not present

## 2021-01-20 DIAGNOSIS — Z8719 Personal history of other diseases of the digestive system: Secondary | ICD-10-CM | POA: Diagnosis not present

## 2021-01-20 DIAGNOSIS — Z7901 Long term (current) use of anticoagulants: Secondary | ICD-10-CM | POA: Diagnosis not present

## 2021-01-20 MED ORDER — METOCLOPRAMIDE HCL 5 MG PO TABS: 5.0000 mg | ORAL_TABLET | Freq: Two times a day (BID) | ORAL | 0 refills | Status: DC | PRN

## 2021-01-20 MED ORDER — TAMSULOSIN HCL 0.4 MG PO CAPS: 0.8000 mg | ORAL_CAPSULE | Freq: Every day | ORAL | 2 refills | Status: DC

## 2021-01-20 NOTE — Progress Notes (Addendum)
Careteam: Patient Care Team: Wardell Honour, MD as PCP - General (Family Medicine) [name], MD as PCP - Cardiology (Cardiology) Vickie Epley, MD as PCP - Electrophysiology (Cardiology)  Seen by: Windell Moulding, AGNP-C  PLACE OF SERVICE:  [place] Directive information    No Known Allergies  Chief Complaint  Patient presents with   Medical Management of Chronic Issues    Patient presents today virtually for a follow-up appointment.     HPI: Patient is a 71 y.o. male seen today via virtual visit for follow up due to recent stroke.   01/04 he presented to the ED due to left sided weakness and slurred speech. MRI brain noted few small acute cortically-based infarcts within mid to posterior right frontal lobes. He was discharged same day. He was set up with [facility] home health for OT/PT. At this time, he is having trouble holding cups. Reports abnormal gait. He has a cane but does not want to use it. No recent falls. He also reports some nausea after eating and is regurgitating his food. Denies trouble swallowing.   He is also having increased back pain. He plans to follow up with [facility] pain specialist in future. Continues to take tylenol or break through pain, gabapentin, and baclofen for stiffness.   At this time he does not feel well enough to drive. He reports having no family or friends that can assist with transportation. He is unable to afford taxi/uber services at this time. Discussed importance of seeing Dr. Sabra Heck. He is interested in transportation services from [facility].   Requesting Flomax refill.     Review of Systems:  Review of Systems  Constitutional:  Positive for malaise/fatigue. Negative for chills, fever and weight loss.  HENT:  Negative for sore throat.   Eyes:  Negative for blurred vision and double vision.  Respiratory:  Negative for cough, shortness of breath and wheezing.   Cardiovascular:  Negative for chest  pain and leg swelling.  Gastrointestinal:  Positive for nausea and vomiting. Negative for abdominal pain, constipation, diarrhea and heartburn.  Genitourinary:  Negative for dysuria and hematuria.  Musculoskeletal:  Positive for back pain. Negative for falls and myalgias.  Skin:  Negative for rash.  Neurological:  Positive for sensory change and weakness. Negative for dizziness and headaches.       Left sided weakness  Psychiatric/Behavioral:  Negative for depression. The patient is not nervous/anxious and does not have insomnia.    Past Medical History:  Diagnosis Date   Aortic atherosclerosis (HCC)    Arthritis    Benign prostatic hyperplasia (BPH) with urinary urgency    COPD (chronic obstructive pulmonary disease) ([place])    Coronary artery disease    10/18 PCI/DES to pLAD, mild nonobstructive disease in the Lcx/RCA. Normal EF.    Depression with anxiety    Dyslipidemia    Dysrhythmia 2016   irregular heartbeat   Gallstones    GERD (gastroesophageal reflux disease)    History of kidney stones    Hyperthyroidism    Leg cramps    Multiple pulmonary nodules    Myocardial infarction ([place])    2018   Pneumonia    Seasonal allergies    Stroke [facility])    Vitamin B 12 deficiency    Past Surgical History:  Procedure Laterality Date   COLONOSCOPY     CORONARY ANGIOPLASTY  10/09/2016   CORONARY STENT INTERVENTION N/A 10/09/2016   Procedure: CORONARY STENT INTERVENTION;  Surgeon: Nelva Bush, MD;  Location: [place] [facility];  Service: Cardiovascular;  Laterality: N/A;   CYSTOSCOPY WITH RETROGRADE PYELOGRAM, URETEROSCOPY AND STENT PLACEMENT Left 06/17/2019   Procedure: CYSTOSCOPY WITH RETROGRADE PYELOGRAM, URETEROSCOPY AND STENT PLACEMENT;  Surgeon: Robley Fries, MD;  Location: [facility];  Service: Urology;  Laterality: Left;  1 HR   HOLMIUM LASER APPLICATION Left 0/03/4915   Procedure: HOLMIUM LASER APPLICATION;  Surgeon: Robley Fries, MD;  Location: [facility];  Service: Urology;   Laterality: Left;   INTRAVASCULAR ULTRASOUND/IVUS N/A 10/09/2016   Procedure: Intravascular Ultrasound/IVUS;  Surgeon: Nelva Bush, MD;  Location: [place] [facility];  Service: Cardiovascular;  Laterality: N/A;   IR CHOLANGIOGRAM EXISTING TUBE  11/18/2018   IR PERC CHOLECYSTOSTOMY  11/16/2018   LEFT HEART CATH AND CORONARY ANGIOGRAPHY N/A 10/09/2016   Procedure: LEFT HEART CATH AND CORONARY ANGIOGRAPHY;  Surgeon: Nelva Bush, MD;  Location: [place] [facility];  Service: Cardiovascular;  Laterality: N/A;   LOOP RECORDER INSERTION N/A 02/20/2020   Procedure: LOOP RECORDER INSERTION;  Surgeon: Vickie Epley, MD;  Location: [place] [facility];  Service: Cardiovascular;  Laterality: N/A;   Social History:   reports that he has been smoking cigarettes. He started smoking about 60 years ago. He has a 90.00 pack-year smoking history. He has never used smokeless tobacco. He reports that he does not currently use alcohol. He reports that he does not use drugs.  Family History  Problem Relation Age of Onset   Alcohol abuse Father        called [facility] per pt   Cancer Father        [facility] PER PT NON SURGICAL    Colon cancer Neg Hx    Colon polyps Neg Hx    Esophageal cancer Neg Hx    Rectal cancer Neg Hx    Stomach cancer Neg Hx     Medications: Patient's Medications  New Prescriptions   No medications on file  Previous Medications   ACETAMINOPHEN (TYLENOL) 500 MG TABLET    Take 1,000 mg by mouth every 6 (six) hours as needed for mild pain.   ASPIRIN EC 81 MG TABLET    Take 1 tablet (81 mg total) by mouth daily. Swallow whole.   ATORVASTATIN (LIPITOR) 80 MG TABLET    Take 1 tablet (80 mg total) by mouth daily.   BACLOFEN 5 MG TABS    Take 5 mg by mouth 4 (four) times daily.   ELIQUIS 5 MG TABS TABLET    TAKE 1 TABLET(5 MG) BY MOUTH TWICE DAILY   GABAPENTIN (NEURONTIN) 100 MG CAPSULE    Take 100 mg by mouth 3 (three) times daily as needed (pain).   MELATONIN 3 MG  TABS TABLET    Take 1 tablet (3 mg total) by mouth at bedtime.   METHIMAZOLE (TAPAZOLE) 5 MG TABLET    TAKE 1 TABLET(5 MG) BY MOUTH DAILY   METOPROLOL SUCCINATE (TOPROL XL) 25 MG 24 HR TABLET    Take 1 tablet (25 mg total) by mouth daily.   TAMSULOSIN (FLOMAX) 0.4 MG CAPS CAPSULE    Take 2 capsules (0.8 mg total) by mouth daily after supper.   TRAZODONE (DESYREL) 100 MG TABLET    TAKE 1 TABLET BY MOUTH AT BEDTIME  Modified Medications   No medications on file  Discontinued Medications   CITALOPRAM (CELEXA) 20 MG TABLET    Take 1 tablet (20 mg total) by mouth daily.  Physical Exam:  There were no vitals filed for this visit. There is no height or weight on file to calculate BMI. Wt Readings from Last 3 Encounters:  12/31/20 216 lb 0.8 oz (98 kg)  12/01/20 222 lb 3.2 oz (100.8 kg)  11/30/20 217 lb (98.4 kg)    Physical Exam Vitals reviewed.  Constitutional:      General: He is not in acute distress.    Comments: No aphasia  HENT:     Head: Normocephalic.  Neurological:     Mental Status: He is alert.  Exam limited due to virtual visit.   Recent Labs    02/15/20 1518 02/16/20 0500 11/30/20 0038 12/31/20 0527 01/12/21 0900 01/12/21 0914 01/12/21 2040  NA  --    < > 138 140 137 140  --   K  --    < > 4.4 4.1 3.8 4.0  --   CL  --    < > 103 107 104 103  --   CO2  --    < > 27 27 26   --   --   GLUCOSE  --    < > 111* 101* 133* 129*  --   BUN  --    < > 15 10 11 13   --   CREATININE  --    < > 1.04 1.13 1.14 1.00  --   CALCIUM  --    < > 8.9 8.3* 9.1  --   --   TSH 0.919  --   --   --   --   --  0.143*   < > = values in this interval not displayed.   Liver Function Tests: Recent Labs    08/19/20 1314 12/31/20 0527 01/12/21 0900  AST 21 15 20   ALT 16 14 14   ALKPHOS 121 84 96  BILITOT 0.7 0.8 0.9  PROT 6.5 5.6* 5.9*  ALBUMIN 3.5 3.2* 3.3*   Recent Labs    04/05/20 1220  LIPASE 39   No results for input(s): AMMONIA in the last 8760 hours. CBC: Recent Labs     11/30/20 0038 12/31/20 0527 01/12/21 0900 01/12/21 0914  WBC 11.0* 10.8* 8.7  --   NEUTROABS 7.1 6.9 5.4  --   HGB 16.5 15.9 17.6* 18.0*  HCT 49.5 47.5 52.4* 53.0*  MCV 85.5 86.8 86.3  --   PLT 155 116* 145*  --    Lipid Panel: Recent Labs    02/16/20 0500 08/12/20 1557 01/12/21 0900 01/13/21 0400  CHOL 168 108  --  130  HDL 41 37*  --  34*  LDLCALC 111* 49  --  81  TRIG 80 124  --  76  CHOLHDL 4.1 2.9  --  3.8  LDLDIRECT  --   --  89.3  --    TSH: Recent Labs    02/15/20 1518 01/12/21 2040  TSH 0.919 0.143*   A1C: Lab Results  Component Value Date   HGBA1C 5.4 01/12/2021     Assessment/Plan 1. Acute CVA (cerebrovascular accident) ([place]) - MRI brain noted few small acute cortically-based infarcts within mid to posterior right frontal lobes - remains on Eliquis - HH PT/OT started - discussed falls safety prevention- advised to use cane or what PT recommends at home  2. Benign prostatic hyperplasia with urinary frequency - tamsulosin (FLOMAX) 0.4 MG CAPS capsule; Take 2 capsules (0.8 mg total) by mouth daily after supper.  Dispense: 30 capsule; Refill: 2  3. Nausea and  vomiting, unspecified vomiting type - reports nausea and regurgitation after eating - advised to trial Reglan bid x 7 days - recommend f/u with Dr. Sabra Heck - metoCLOPramide (REGLAN) 5 MG tablet; Take 1 tablet (5 mg total) by mouth 2 (two) times daily as needed for nausea.  Dispense: 60 tablet; Refill: 0  Virtual Visit   I connected with David Hendricks by virtual visit/ video and verified that I am speaking with the correct person using two identifiers.   Patient: David Hendricks Patient location: home Provider: Windell Moulding, AGNP-C Provider location: [facility]    I discussed the limitations, risks, security and privacy concerns of performing an evaluation and management service by telephone and the availability of in person appointments. I also discussed with the patient that there may be  a patient responsible charge related to this service. The patient expressed understanding and agreed to proceed.    I discussed the assessment and treatment plan with the patient. The patient was provided an opportunity to ask questions and all were answered. The patient agreed with the plan and demonstrated an understanding of the instructions.     The patient was advised to call back or seek an in-person evaluation if the symptoms worsen or if the condition fails to improve as anticipated.   I provided 16 minutes of face-to-face time during this encounter.   Trine Fread Cleophas Dunker, [place] printed and mailed     Next appt: none Rosezetta Balderston [place], [place] [facility] [phone]

## 2021-01-20 NOTE — Patient Instructions (Signed)
Please schedule follow up with Dr. Sabra Heck  Ask our office about transportation assistance

## 2021-01-21 ENCOUNTER — Telehealth: Payer: Self-pay

## 2021-01-21 NOTE — Telephone Encounter (Signed)
May give home health OT verbal Orders  for one time a week for 2 weeks.

## 2021-01-21 NOTE — Telephone Encounter (Signed)
David Hendricks with [facility] called for verbal for 1 time a week for 2 weeks, ADL, pain control and home program and COPD educations Please advise  Message routed to Marlowe Sax, NP   David Hendricks [phone]

## 2021-01-21 NOTE — Telephone Encounter (Signed)
Called and spoke with patient who declined an appt.  Pt stated that his car is broken down so he does not have transportation.  I offered to help him get set up with [facility] so he could get to his appt, pt declined this.  He stated he recently had a stroke and just has a 'lot going on' right now.  I provided pt phone number to [facility] so he can call for an appt when he is ready to schedule, pt agreeable.

## 2021-01-21 NOTE — Telephone Encounter (Signed)
Alert ILR summary report received. Battery status OK. Normal device function. No new symptom, tachy, brady, or pause episodes. 29 AF new AF episodes, overall poor rate control.  Burden 25.1%.  Episode in progress from 1/13 @ 02:57. Metoprolol, Eliquis prescribed.  Called patient and reports increased fatigue and not sleeping well. Had recent hospital visit and found new onset AF. Patient reports he did not take medications yesterday due to several in home visits but will today. Reports he takes them normally on a regular base.  Recommended to patient AF clinic referral considering symptoms. Patient is agreeable.

## 2021-01-24 ENCOUNTER — Telehealth: Payer: Self-pay | Admitting: *Deleted

## 2021-01-24 DIAGNOSIS — I2510 Atherosclerotic heart disease of native coronary artery without angina pectoris: Secondary | ICD-10-CM | POA: Diagnosis not present

## 2021-01-24 DIAGNOSIS — I69354 Hemiplegia and hemiparesis following cerebral infarction affecting left non-dominant side: Secondary | ICD-10-CM | POA: Diagnosis not present

## 2021-01-24 DIAGNOSIS — I10 Essential (primary) hypertension: Secondary | ICD-10-CM | POA: Diagnosis not present

## 2021-01-24 DIAGNOSIS — J449 Chronic obstructive pulmonary disease, unspecified: Secondary | ICD-10-CM | POA: Diagnosis not present

## 2021-01-24 DIAGNOSIS — I69392 Facial weakness following cerebral infarction: Secondary | ICD-10-CM | POA: Diagnosis not present

## 2021-01-24 DIAGNOSIS — I480 Paroxysmal atrial fibrillation: Secondary | ICD-10-CM | POA: Diagnosis not present

## 2021-01-24 NOTE — Telephone Encounter (Signed)
Carlean Jews with [facility] called and stated that she reviewed patient's medications today with patient and he had Lyrica instead of Gabapentin in home that he was taking. Patient stated that he had thrown out the Gabapentin.   The hospital switched him to the Gabapentin and nurse is wanting a Rx to be sent to Pharmacy, [facility].   Stated that she will explained the switch to the patient.   Pended Rx and sent to Dr. Sabra Heck for approval.

## 2021-01-25 ENCOUNTER — Telehealth: Payer: Self-pay | Admitting: *Deleted

## 2021-01-25 DIAGNOSIS — I480 Paroxysmal atrial fibrillation: Secondary | ICD-10-CM | POA: Diagnosis not present

## 2021-01-25 DIAGNOSIS — I10 Essential (primary) hypertension: Secondary | ICD-10-CM

## 2021-01-25 DIAGNOSIS — I69354 Hemiplegia and hemiparesis following cerebral infarction affecting left non-dominant side: Secondary | ICD-10-CM | POA: Diagnosis not present

## 2021-01-25 DIAGNOSIS — J449 Chronic obstructive pulmonary disease, unspecified: Secondary | ICD-10-CM | POA: Diagnosis not present

## 2021-01-25 DIAGNOSIS — I69392 Facial weakness following cerebral infarction: Secondary | ICD-10-CM | POA: Diagnosis not present

## 2021-01-25 DIAGNOSIS — I2510 Atherosclerotic heart disease of native coronary artery without angina pectoris: Secondary | ICD-10-CM | POA: Diagnosis not present

## 2021-01-25 MED ORDER — GABAPENTIN 100 MG PO CAPS: 100.0000 mg | ORAL_CAPSULE | Freq: Three times a day (TID) | ORAL | 5 refills | Status: DC | PRN

## 2021-01-25 NOTE — Telephone Encounter (Signed)
Sam with [facility] [phone] called and stated that she was doing medication management with patient and there was some confusion with patient taking Lyrica vs. Gabapentin.   Informed her that the hospital stopped the Lyrica on 01/12/21 and started the Gabapentin. Told her that Wells Guiles with [facility] called yesterday regarding patient's medications, same concern. She requested her phone number so they could straighten out medications.   She also had concerns with patient NOT taking Metoprolol. Stated that patient does not have that medication. Prescribed by Cardiologist, stated that she will give them a call.   Also, requesting a [id number] (PCS Services) form filled out for patient. Stated that she doesn't feel that patient can manage his own medications.  Sam will email form to me to have Provider fill out.    FYI

## 2021-01-25 NOTE — Telephone Encounter (Signed)
Left message approving orders for patient.

## 2021-01-26 DIAGNOSIS — F331 Major depressive disorder, recurrent, moderate: Secondary | ICD-10-CM | POA: Diagnosis not present

## 2021-01-26 NOTE — Telephone Encounter (Signed)
Received PCS Form ([id number]) from Sam with [facility].  Filled out and placed in Dr. Ammie Ferrier folder to review, fill out and sign.  Also requesting an order for Blood pressure cuff to be attached with form.   To be faxed to [facility] [phone] once completed                Fax: [phone]

## 2021-01-27 ENCOUNTER — Telehealth: Payer: Self-pay

## 2021-01-27 DIAGNOSIS — I69354 Hemiplegia and hemiparesis following cerebral infarction affecting left non-dominant side: Secondary | ICD-10-CM | POA: Diagnosis not present

## 2021-01-27 DIAGNOSIS — I69392 Facial weakness following cerebral infarction: Secondary | ICD-10-CM | POA: Diagnosis not present

## 2021-01-27 DIAGNOSIS — I2510 Atherosclerotic heart disease of native coronary artery without angina pectoris: Secondary | ICD-10-CM | POA: Diagnosis not present

## 2021-01-27 DIAGNOSIS — I480 Paroxysmal atrial fibrillation: Secondary | ICD-10-CM | POA: Diagnosis not present

## 2021-01-27 DIAGNOSIS — J449 Chronic obstructive pulmonary disease, unspecified: Secondary | ICD-10-CM | POA: Diagnosis not present

## 2021-01-27 DIAGNOSIS — I10 Essential (primary) hypertension: Secondary | ICD-10-CM | POA: Diagnosis not present

## 2021-01-27 NOTE — Telephone Encounter (Signed)
Please reach out to [facility] Pain specialist- we did not prescribe Lyrica. We recommended gabapentin.

## 2021-01-27 NOTE — Telephone Encounter (Signed)
Called and discussed with the patient. No further questions. David Hendricks, [place].

## 2021-01-27 NOTE — Telephone Encounter (Signed)
Jeremy Johann with [facility] called stating gabapentin 100 mg was just delivered. Patient thinks he is suppose to stop it and start pregabalin  Nurse says it should be the other way around. Call patient [phone] and Timmothy Sours [phone] with response.   Per Timmothy Sours, "Whichever patient is to stop remind him put it away/out of bucket"  To A. Cleophas Dunker, NP

## 2021-01-28 DIAGNOSIS — I69392 Facial weakness following cerebral infarction: Secondary | ICD-10-CM | POA: Diagnosis not present

## 2021-01-28 DIAGNOSIS — I480 Paroxysmal atrial fibrillation: Secondary | ICD-10-CM | POA: Diagnosis not present

## 2021-01-28 DIAGNOSIS — I10 Essential (primary) hypertension: Secondary | ICD-10-CM | POA: Diagnosis not present

## 2021-01-28 DIAGNOSIS — I69354 Hemiplegia and hemiparesis following cerebral infarction affecting left non-dominant side: Secondary | ICD-10-CM | POA: Diagnosis not present

## 2021-01-28 DIAGNOSIS — I2510 Atherosclerotic heart disease of native coronary artery without angina pectoris: Secondary | ICD-10-CM | POA: Diagnosis not present

## 2021-01-28 DIAGNOSIS — J449 Chronic obstructive pulmonary disease, unspecified: Secondary | ICD-10-CM | POA: Diagnosis not present

## 2021-01-31 ENCOUNTER — Ambulatory Visit (INDEPENDENT_AMBULATORY_CARE_PROVIDER_SITE_OTHER): Payer: Medicare Other

## 2021-01-31 DIAGNOSIS — I639 Cerebral infarction, unspecified: Secondary | ICD-10-CM | POA: Diagnosis not present

## 2021-01-31 LAB — CUP PACEART REMOTE DEVICE CHECK
Date Time Interrogation Session: 20230122230358
Implantable Pulse Generator Implant Date: 20220211

## 2021-02-01 ENCOUNTER — Telehealth (HOSPITAL_COMMUNITY): Payer: Self-pay | Admitting: Physician Assistant

## 2021-02-01 DIAGNOSIS — I10 Essential (primary) hypertension: Secondary | ICD-10-CM | POA: Diagnosis not present

## 2021-02-01 DIAGNOSIS — I480 Paroxysmal atrial fibrillation: Secondary | ICD-10-CM | POA: Diagnosis not present

## 2021-02-01 DIAGNOSIS — I2510 Atherosclerotic heart disease of native coronary artery without angina pectoris: Secondary | ICD-10-CM | POA: Diagnosis not present

## 2021-02-01 DIAGNOSIS — J449 Chronic obstructive pulmonary disease, unspecified: Secondary | ICD-10-CM | POA: Diagnosis not present

## 2021-02-01 DIAGNOSIS — I69354 Hemiplegia and hemiparesis following cerebral infarction affecting left non-dominant side: Secondary | ICD-10-CM | POA: Diagnosis not present

## 2021-02-01 DIAGNOSIS — I69392 Facial weakness following cerebral infarction: Secondary | ICD-10-CM | POA: Diagnosis not present

## 2021-02-01 NOTE — Telephone Encounter (Signed)
Called and left message for patient to call back to schedule appt per Dr. Quentin Ore to discuss AAD vs ablation.

## 2021-02-01 NOTE — Telephone Encounter (Addendum)
PCS Form and BP Cuff order faxed to [facility] [phone]                         Fax: [phone]  Copy sent to scanning.

## 2021-02-02 ENCOUNTER — Telehealth: Payer: Self-pay | Admitting: Interventional Cardiology

## 2021-02-02 DIAGNOSIS — I69392 Facial weakness following cerebral infarction: Secondary | ICD-10-CM | POA: Diagnosis not present

## 2021-02-02 DIAGNOSIS — I69354 Hemiplegia and hemiparesis following cerebral infarction affecting left non-dominant side: Secondary | ICD-10-CM | POA: Diagnosis not present

## 2021-02-02 DIAGNOSIS — I2510 Atherosclerotic heart disease of native coronary artery without angina pectoris: Secondary | ICD-10-CM | POA: Diagnosis not present

## 2021-02-02 DIAGNOSIS — I10 Essential (primary) hypertension: Secondary | ICD-10-CM | POA: Diagnosis not present

## 2021-02-02 DIAGNOSIS — I480 Paroxysmal atrial fibrillation: Secondary | ICD-10-CM | POA: Diagnosis not present

## 2021-02-02 DIAGNOSIS — J449 Chronic obstructive pulmonary disease, unspecified: Secondary | ICD-10-CM | POA: Diagnosis not present

## 2021-02-02 NOTE — Telephone Encounter (Signed)
[facility] has some  Questions for Dr. Irish Lack about pts medications... please advise

## 2021-02-02 NOTE — Telephone Encounter (Signed)
Called and spoke with patient. He states he is having problems with his back currently and is not interested in coming for appt for at least a week to 10 days.  Pt agreeable to appt 02/15/21 with [facility].

## 2021-02-02 NOTE — Telephone Encounter (Signed)
I spoke with [place] who is asking if patient should be taking Toprol and about follow up appointments.  I told her patient was last seen in office in April 2022 but according to recent hospital discharge summary he is to be taking Toprol 25 mg by mouth daily.  Follow up in afib clinic on 02/15/21

## 2021-02-03 DIAGNOSIS — F331 Major depressive disorder, recurrent, moderate: Secondary | ICD-10-CM | POA: Diagnosis not present

## 2021-02-08 ENCOUNTER — Other Ambulatory Visit: Payer: Self-pay | Admitting: Family

## 2021-02-08 ENCOUNTER — Telehealth: Payer: Self-pay | Admitting: *Deleted

## 2021-02-08 NOTE — Telephone Encounter (Signed)
David Honour, MD  You 3 minutes ago (12:26 PM)   He is supposed to be taking 20 mg Citalopram     [employer] and spoke with [place].  Medication list updated.

## 2021-02-08 NOTE — Telephone Encounter (Signed)
[facility] called and stated that patient was requesting a refill on his Citalopram but did not know what dosage he is taking. Pharmacy has on file 10 and 20mg  for patient.   Hospital Discharge on 01/12/21 has for patient to take Citalopram 20mg . Medication was removed from patient's list on visit 01/20/2021.   Pharmacy is wanting to know if patient should be taking it and what strength.   Please Advise.

## 2021-02-09 ENCOUNTER — Other Ambulatory Visit: Payer: Self-pay | Admitting: Family Medicine

## 2021-02-09 NOTE — Telephone Encounter (Signed)
RX last filled by Dr.Lovorn with [facility] on 03/31/2020.   I called patient to clarify dose and he stated he is taking medication as listed on medication list, Baclofen 5 mg four times daily.  Dose and instructions requested by pharmacy 10 mg three times daily is incorrect.  Before I could ask patient if he was still seeing Dr.Lovorn, patient stated he did not feel like talking anymore and hung up.

## 2021-02-10 NOTE — Progress Notes (Signed)
[facility] Summary Report / Loop Recorder 

## 2021-02-14 ENCOUNTER — Telehealth (HOSPITAL_COMMUNITY): Payer: Self-pay | Admitting: Physician Assistant

## 2021-02-14 NOTE — Telephone Encounter (Signed)
Patient called to cancel 02/15/21 appt with [facility].  Pt states he is still having problems with his back pain and car trouble.  Pt declined to reschedule appt and states he will call back when he is able to come.  The appt for 02/15/21 with [facility] was cancelled.  Will forward to [facility].

## 2021-02-15 ENCOUNTER — Other Ambulatory Visit: Payer: Self-pay

## 2021-02-15 ENCOUNTER — Ambulatory Visit (INDEPENDENT_AMBULATORY_CARE_PROVIDER_SITE_OTHER): Payer: Medicare Other | Admitting: Family Medicine

## 2021-02-15 ENCOUNTER — Ambulatory Visit (HOSPITAL_COMMUNITY): Payer: Medicare Other | Admitting: Physician Assistant

## 2021-02-15 ENCOUNTER — Encounter: Payer: Self-pay | Admitting: Family Medicine

## 2021-02-15 VITALS — BP 130/70 | HR 83 | Temp 97.3°F | Ht 73.0 in | Wt 214.8 lb

## 2021-02-15 DIAGNOSIS — G8929 Other chronic pain: Secondary | ICD-10-CM | POA: Diagnosis not present

## 2021-02-15 DIAGNOSIS — M545 Low back pain, unspecified: Secondary | ICD-10-CM | POA: Diagnosis not present

## 2021-02-15 DIAGNOSIS — L918 Other hypertrophic disorders of the skin: Secondary | ICD-10-CM

## 2021-02-15 DIAGNOSIS — F32A Depression, unspecified: Secondary | ICD-10-CM | POA: Diagnosis not present

## 2021-02-15 DIAGNOSIS — I639 Cerebral infarction, unspecified: Secondary | ICD-10-CM

## 2021-02-15 DIAGNOSIS — I700 Atherosclerosis of aorta: Secondary | ICD-10-CM

## 2021-02-15 DIAGNOSIS — I10 Essential (primary) hypertension: Secondary | ICD-10-CM

## 2021-02-15 DIAGNOSIS — F419 Anxiety disorder, unspecified: Secondary | ICD-10-CM | POA: Diagnosis not present

## 2021-02-15 DIAGNOSIS — G629 Polyneuropathy, unspecified: Secondary | ICD-10-CM

## 2021-02-15 DIAGNOSIS — M549 Dorsalgia, unspecified: Secondary | ICD-10-CM | POA: Insufficient documentation

## 2021-02-15 MED ORDER — GABAPENTIN 300 MG PO CAPS: 300.0000 mg | ORAL_CAPSULE | Freq: Two times a day (BID) | ORAL | 3 refills | Status: DC

## 2021-02-15 MED ORDER — APIXABAN 5 MG PO TABS: 5.0000 mg | ORAL_TABLET | Freq: Two times a day (BID) | ORAL | 5 refills | Status: DC

## 2021-02-15 MED ORDER — METOPROLOL SUCCINATE ER 25 MG PO TB24: 25.0000 mg | ORAL_TABLET | Freq: Every day | ORAL | 2 refills | Status: DC

## 2021-02-15 MED ORDER — APIXABAN 5 MG PO TABS: ORAL_TABLET | ORAL | 3 refills | Status: DC

## 2021-02-15 NOTE — Patient Instructions (Signed)
Take 3 gabapentin at bedtime and morning Pick  up new prescription which will be 300 mg. Take those twice a day If pain not improved after two weeks, double the dose (600) to BID

## 2021-02-15 NOTE — Telephone Encounter (Signed)
Pt last saw Dr Quentin Ore 04/27/20, last labs 01/12/21 Creat 1.0, age 71, weight 97.4kg, based on specified criteria pt is on appropriate dosage of Eliquis 5mg  BID for afib.  Will refill rx.

## 2021-02-15 NOTE — Progress Notes (Signed)
Provider:  Alain Honey, MD  Careteam: Patient Care Team: Wardell Honour, MD as PCP - General (Family Medicine) [name], MD as PCP - Cardiology (Cardiology) Vickie Epley, MD as PCP - Electrophysiology (Cardiology)  PLACE OF SERVICE:  [place] Directive information    No Known Allergies  Chief Complaint  Patient presents with   Acute Visit    Patient presents today for back pain and would like a back brace. He reports that a doctor told him once before his spinal was moving. He also would like for his lower right lip to be checked for a skin tag.     HPI: Patient is a 71 y.o. male chief complaint low back pain without radiculopathy.  He had CT of the lumbar as well as cervical spine and brain 2 months ago.  At that time he had multilevel disc disease without rupture some retrolisthesis.  He currently takes baclofen 5 mg as well as gabapentin 200 mg Other complaint is a skin tag on his lower lip that he shaves off when shaving with resultant bleeding and he wants to see if I can remove that. He was seen for what was felt to be a CVA back after the first of the year.  He declined admission to the hospital and further therapy and symptoms resolved.  Review of Systems:  Review of Systems  Constitutional:  Positive for malaise/fatigue.  Respiratory: Negative.    Cardiovascular: Negative.   Genitourinary: Negative.   Musculoskeletal: Negative.   All other systems reviewed and are negative.  Past Medical History:  Diagnosis Date   Aortic atherosclerosis (HCC)    Arthritis    Benign prostatic hyperplasia (BPH) with urinary urgency    COPD (chronic obstructive pulmonary disease) ([place])    Coronary artery disease    10/18 PCI/DES to pLAD, mild nonobstructive disease in the Lcx/RCA. Normal EF.    Depression with anxiety    Dyslipidemia    Dysrhythmia 2016   irregular heartbeat   Gallstones    GERD (gastroesophageal reflux disease)     History of kidney stones    Hyperthyroidism    Leg cramps    Multiple pulmonary nodules    Myocardial infarction ([place])    2018   Pneumonia    Seasonal allergies    Stroke [facility])    Vitamin B 12 deficiency    Past Surgical History:  Procedure Laterality Date   COLONOSCOPY     CORONARY ANGIOPLASTY  10/09/2016   CORONARY STENT INTERVENTION N/A 10/09/2016   Procedure: CORONARY STENT INTERVENTION;  Surgeon: Nelva Bush, MD;  Location: [place] [facility];  Service: Cardiovascular;  Laterality: N/A;   CYSTOSCOPY WITH RETROGRADE PYELOGRAM, URETEROSCOPY AND STENT PLACEMENT Left 06/17/2019   Procedure: CYSTOSCOPY WITH RETROGRADE PYELOGRAM, URETEROSCOPY AND STENT PLACEMENT;  Surgeon: Robley Fries, MD;  Location: [facility];  Service: Urology;  Laterality: Left;  1 HR   HOLMIUM LASER APPLICATION Left 1/0/0712   Procedure: HOLMIUM LASER APPLICATION;  Surgeon: Robley Fries, MD;  Location: [facility];  Service: Urology;  Laterality: Left;   INTRAVASCULAR ULTRASOUND/IVUS N/A 10/09/2016   Procedure: Intravascular Ultrasound/IVUS;  Surgeon: Nelva Bush, MD;  Location: [place] [facility];  Service: Cardiovascular;  Laterality: N/A;   IR CHOLANGIOGRAM EXISTING TUBE  11/18/2018   IR PERC CHOLECYSTOSTOMY  11/16/2018   LEFT HEART CATH AND CORONARY ANGIOGRAPHY N/A 10/09/2016   Procedure: LEFT HEART CATH AND CORONARY ANGIOGRAPHY;  Surgeon: Nelva Bush, MD;  Location: [place] [facility];  Service: Cardiovascular;  Laterality: N/A;   LOOP RECORDER INSERTION N/A 02/20/2020   Procedure: LOOP RECORDER INSERTION;  Surgeon: Vickie Epley, MD;  Location: [place] [facility];  Service: Cardiovascular;  Laterality: N/A;   Social History:   reports that he has been smoking cigarettes. He started smoking about 60 years ago. He has a 90.00 pack-year smoking history. He has never used smokeless tobacco. He reports that he does not currently use alcohol. He reports that he does not use drugs.  Family History   Problem Relation Age of Onset   Alcohol abuse Father        called [facility] per pt   Cancer Father        [facility] PER PT NON SURGICAL    Colon cancer Neg Hx    Colon polyps Neg Hx    Esophageal cancer Neg Hx    Rectal cancer Neg Hx    Stomach cancer Neg Hx     Medications: Patient's Medications  New Prescriptions   No medications on file  Previous Medications   ACETAMINOPHEN (TYLENOL) 500 MG TABLET    Take 1,000 mg by mouth every 6 (six) hours as needed for mild pain.   ASPIRIN EC 81 MG TABLET    Take 1 tablet (81 mg total) by mouth daily. Swallow whole.   ATORVASTATIN (LIPITOR) 80 MG TABLET    Take 1 tablet (80 mg total) by mouth daily.   BACLOFEN 5 MG TABS    Take 5 mg by mouth 4 (four) times daily.   CITALOPRAM (CELEXA) 20 MG TABLET    Take 20 mg by mouth daily.   ELIQUIS 5 MG TABS TABLET    TAKE 1 TABLET(5 MG) BY MOUTH TWICE DAILY   GABAPENTIN (NEURONTIN) 100 MG CAPSULE    Take 1 capsule (100 mg total) by mouth 3 (three) times daily as needed (pain).   MELATONIN 3 MG TABS TABLET    Take 1 tablet (3 mg total) by mouth at bedtime.   METHIMAZOLE (TAPAZOLE) 5 MG TABLET    TAKE 1 TABLET(5 MG) BY MOUTH DAILY   METOCLOPRAMIDE (REGLAN) 5 MG TABLET    Take 1 tablet (5 mg total) by mouth 2 (two) times daily as needed for nausea.   METOPROLOL SUCCINATE (TOPROL XL) 25 MG 24 HR TABLET    Take 1 tablet (25 mg total) by mouth daily. Please make yearly appt with Dr. Quentin Ore for April 2023 for future refills. Thank you 1st attempt   TAMSULOSIN (FLOMAX) 0.4 MG CAPS CAPSULE    Take 2 capsules (0.8 mg total) by mouth daily after supper.   TRAZODONE (DESYREL) 100 MG TABLET    TAKE 1 TABLET BY MOUTH AT BEDTIME  Modified Medications   No medications on file  Discontinued Medications   No medications on file    Physical Exam:  Vitals:   02/15/21 1404  BP: 130/70  Pulse: 83  Temp: (!) 97.3 F (36.3 C)  SpO2: 98%  Weight: 214 lb 12.8 oz (97.4 kg)  Height: 6\' 1"  (1.854 m)    Body mass index is 28.34 kg/m. Wt Readings from Last 3 Encounters:  02/15/21 214 lb 12.8 oz (97.4 kg)  12/31/20 216 lb 0.8 oz (98 kg)  12/01/20 222 lb 3.2 oz (100.8 kg)    Physical Exam Vitals and nursing note reviewed.  Constitutional:      Appearance: Normal appearance.  Cardiovascular:     Rate  and Rhythm: Normal rate.  Pulmonary:     Effort: Pulmonary effort is normal.  Musculoskeletal:        General: Tenderness present.     Right lower leg: No edema.     Left lower leg: No edema.     Comments: Deep tendon reflexes are symmetric Straight leg raising is negative  Skin:    Comments: Patient appears to have a flat skin tag right lower lip.  Since I do not have equipment here for adequate hemostasis ride to shave this I elected to try and freeze it with liquid nitrogen.  We will reassess on next visit  Neurological:     Mental Status: He is alert.    Labs reviewed: Basic Metabolic Panel: Recent Labs    11/30/20 0038 12/31/20 0527 01/12/21 0900 01/12/21 0914 01/12/21 2040  NA 138 140 137 140  --   K 4.4 4.1 3.8 4.0  --   CL 103 107 104 103  --   CO2 27 27 26   --   --   GLUCOSE 111* 101* 133* 129*  --   BUN 15 10 11 13   --   CREATININE 1.04 1.13 1.14 1.00  --   CALCIUM 8.9 8.3* 9.1  --   --   TSH  --   --   --   --  0.143*   Liver Function Tests: Recent Labs    08/19/20 1314 12/31/20 0527 01/12/21 0900  AST 21 15 20   ALT 16 14 14   ALKPHOS 121 84 96  BILITOT 0.7 0.8 0.9  PROT 6.5 5.6* 5.9*  ALBUMIN 3.5 3.2* 3.3*   Recent Labs    04/05/20 1220  LIPASE 39   No results for input(s): AMMONIA in the last 8760 hours. CBC: Recent Labs    11/30/20 0038 12/31/20 0527 01/12/21 0900 01/12/21 0914  WBC 11.0* 10.8* 8.7  --   NEUTROABS 7.1 6.9 5.4  --   HGB 16.5 15.9 17.6* 18.0*  HCT 49.5 47.5 52.4* 53.0*  MCV 85.5 86.8 86.3  --   PLT 155 116* 145*  --    Lipid Panel: Recent Labs    08/12/20 1557 01/12/21 0900 01/13/21 0400  CHOL 108  --  130   HDL 37*  --  34*  LDLCALC 49  --  81  TRIG 124  --  76  CHOLHDL 2.9  --  3.8  LDLDIRECT  --  89.3  --    TSH: Recent Labs    01/12/21 2040  TSH 0.143*   A1C: Lab Results  Component Value Date   HGBA1C 5.4 01/12/2021     Assessment/Plan  1. Neuropathy Patient takes low-dose gabapentin for that we will plan to increase dose but more for back pain  2. Acute CVA (cerebrovascular accident) [facility]) This seems to have resolved.  Due to quick resolution probably more likely a TIA rather than CVA  3. Anxiety and depression Continue with trazodone at night  4. Aortic atherosclerosis ([place]) Patient taking atorvastatin 80 mg/day as well as metoprolol.  Asymptomatic presently  5. Essential hypertension Blood pressure is good today at 130/70.  Continue metoprolol  6. Chronic bilateral low back pain without sciatica Increase gabapentin from 200 a day to 300 twice a day and depending on symptoms by increase again to 600 twice a day.  Patient will return in 1 month for follow-up  7. Cutaneous skin tags Skin tags frozen with liquid nitrogen we will reassess at 1 month follow-up  Alain Honey, MD [facility] [phone]

## 2021-02-22 ENCOUNTER — Telehealth: Payer: Self-pay | Admitting: *Deleted

## 2021-02-22 MED ORDER — COMFORT TOUCH BP CUFF/LARGE MISC: 1.0000 | Freq: Two times a day (BID) | 0 refills | Status: AC

## 2021-02-22 NOTE — Telephone Encounter (Signed)
Sam with [facility] [phone] called and stated that patient is requesting a Rx to be sent to pharmacy for a blood pressure cuff. Requesting it to be sent to [facility] pharmacy.

## 2021-02-22 NOTE — Telephone Encounter (Signed)
Wardell Honour, MD  You 6 minutes ago (4:05 PM)   Can we make that happen?       Rx sent to pharmacy.

## 2021-02-25 ENCOUNTER — Other Ambulatory Visit: Payer: Self-pay | Admitting: Family Medicine

## 2021-02-25 DIAGNOSIS — F5101 Primary insomnia: Secondary | ICD-10-CM

## 2021-02-25 NOTE — Telephone Encounter (Signed)
Patient has request refill on medication "Trazodone 100mg ". Patient last refill date 11/09/2020. Patient pharmacy will place prescription refill on file when patient runs out of medication. Medication has High Risk Warnings. Medication pend and sent to Marlowe Sax, NP due to PCP Sabra Heck Lillette Boxer, MD being out of office.

## 2021-03-07 ENCOUNTER — Ambulatory Visit (INDEPENDENT_AMBULATORY_CARE_PROVIDER_SITE_OTHER): Payer: Medicare Other

## 2021-03-07 DIAGNOSIS — I639 Cerebral infarction, unspecified: Secondary | ICD-10-CM

## 2021-03-07 LAB — CUP PACEART REMOTE DEVICE CHECK
Date Time Interrogation Session: 20230226230827
Implantable Pulse Generator Implant Date: 20220211

## 2021-03-10 NOTE — Progress Notes (Signed)
[facility] Summary Report / Loop Recorder 

## 2021-03-22 ENCOUNTER — Ambulatory Visit: Payer: Medicare Other | Admitting: Family Medicine

## 2021-04-11 ENCOUNTER — Emergency Department (HOSPITAL_COMMUNITY): Payer: Medicare Other

## 2021-04-11 ENCOUNTER — Ambulatory Visit (INDEPENDENT_AMBULATORY_CARE_PROVIDER_SITE_OTHER): Payer: Medicare Other

## 2021-04-11 ENCOUNTER — Emergency Department (HOSPITAL_COMMUNITY)
Admission: EM | Admit: 2021-04-11 | Discharge: 2021-04-11 | Disposition: A | Discharge status: Home / Self Care | Payer: Medicare Other | Attending: Emergency Medicine | Admitting: Emergency Medicine

## 2021-04-11 DIAGNOSIS — S0990XA Unspecified injury of head, initial encounter: Secondary | ICD-10-CM | POA: Diagnosis not present

## 2021-04-11 DIAGNOSIS — D72829 Elevated white blood cell count, unspecified: Secondary | ICD-10-CM | POA: Diagnosis not present

## 2021-04-11 DIAGNOSIS — R42 Dizziness and giddiness: Secondary | ICD-10-CM | POA: Insufficient documentation

## 2021-04-11 DIAGNOSIS — Y900 Blood alcohol level of less than 20 mg/100 ml: Secondary | ICD-10-CM | POA: Insufficient documentation

## 2021-04-11 DIAGNOSIS — Z043 Encounter for examination and observation following other accident: Secondary | ICD-10-CM | POA: Diagnosis not present

## 2021-04-11 DIAGNOSIS — I639 Cerebral infarction, unspecified: Secondary | ICD-10-CM

## 2021-04-11 DIAGNOSIS — I480 Paroxysmal atrial fibrillation: Secondary | ICD-10-CM | POA: Insufficient documentation

## 2021-04-11 DIAGNOSIS — S8991XA Unspecified injury of right lower leg, initial encounter: Secondary | ICD-10-CM | POA: Diagnosis not present

## 2021-04-11 DIAGNOSIS — I10 Essential (primary) hypertension: Secondary | ICD-10-CM | POA: Diagnosis not present

## 2021-04-11 DIAGNOSIS — Z7982 Long term (current) use of aspirin: Secondary | ICD-10-CM | POA: Diagnosis not present

## 2021-04-11 DIAGNOSIS — W01198A Fall on same level from slipping, tripping and stumbling with subsequent striking against other object, initial encounter: Secondary | ICD-10-CM | POA: Insufficient documentation

## 2021-04-11 DIAGNOSIS — R000 Tachycardia, unspecified: Secondary | ICD-10-CM | POA: Diagnosis not present

## 2021-04-11 DIAGNOSIS — I6529 Occlusion and stenosis of unspecified carotid artery: Secondary | ICD-10-CM | POA: Diagnosis not present

## 2021-04-11 DIAGNOSIS — Y93K1 Activity, walking an animal: Secondary | ICD-10-CM | POA: Insufficient documentation

## 2021-04-11 DIAGNOSIS — S199XXA Unspecified injury of neck, initial encounter: Secondary | ICD-10-CM | POA: Diagnosis not present

## 2021-04-11 DIAGNOSIS — Z7901 Long term (current) use of anticoagulants: Secondary | ICD-10-CM | POA: Insufficient documentation

## 2021-04-11 DIAGNOSIS — Z20822 Contact with and (suspected) exposure to covid-19: Secondary | ICD-10-CM | POA: Insufficient documentation

## 2021-04-11 DIAGNOSIS — Z79899 Other long term (current) drug therapy: Secondary | ICD-10-CM | POA: Diagnosis not present

## 2021-04-11 DIAGNOSIS — I2510 Atherosclerotic heart disease of native coronary artery without angina pectoris: Secondary | ICD-10-CM | POA: Diagnosis not present

## 2021-04-11 DIAGNOSIS — W19XXXA Unspecified fall, initial encounter: Secondary | ICD-10-CM

## 2021-04-11 DIAGNOSIS — G319 Degenerative disease of nervous system, unspecified: Secondary | ICD-10-CM | POA: Diagnosis not present

## 2021-04-11 LAB — CUP PACEART REMOTE DEVICE CHECK
Date Time Interrogation Session: 20230331230837
Implantable Pulse Generator Implant Date: 20220211

## 2021-04-11 LAB — TSH: TSH: 0.552 u[IU]/mL (ref 0.350–4.500)

## 2021-04-11 LAB — URINALYSIS, ROUTINE W REFLEX MICROSCOPIC
Bilirubin Urine: NEGATIVE
Glucose, UA: NEGATIVE mg/dL
Hgb urine dipstick: NEGATIVE
Ketones, ur: NEGATIVE mg/dL
Leukocytes,Ua: NEGATIVE
Nitrite: NEGATIVE
Protein, ur: NEGATIVE mg/dL
Specific Gravity, Urine: 1.019 (ref 1.005–1.030)
pH: 5 (ref 5.0–8.0)

## 2021-04-11 LAB — BASIC METABOLIC PANEL
Anion gap: 9 (ref 5–15)
BUN: 13 mg/dL (ref 8–23)
CO2: 26 mmol/L (ref 22–32)
Calcium: 8.9 mg/dL (ref 8.9–10.3)
Chloride: 104 mmol/L (ref 98–111)
Creatinine, Ser: 1.16 mg/dL (ref 0.61–1.24)
GFR, Estimated: >60 mL/min (ref >60)
Glucose, Bld: 103 mg/dL — ABNORMAL HIGH (ref 70–99)
Potassium: 4.3 mmol/L (ref 3.5–5.1)
Sodium: 139 mmol/L (ref 135–145)

## 2021-04-11 LAB — MAGNESIUM: Magnesium: 2.1 mg/dL (ref 1.7–2.4)

## 2021-04-11 LAB — CBC WITH DIFFERENTIAL/PLATELET
Abs Immature Granulocytes: 0.03 10*3/uL (ref 0.00–0.07)
Basophils Absolute: 0.1 10*3/uL (ref 0.0–0.1)
Basophils Relative: 1 %
Eosinophils Absolute: 0.2 10*3/uL (ref 0.0–0.5)
Eosinophils Relative: 1 %
HCT: 53.3 % — ABNORMAL HIGH (ref 39.0–52.0)
Hemoglobin: 18.1 g/dL — ABNORMAL HIGH (ref 13.0–17.0)
Immature Granulocytes: 0 %
Lymphocytes Relative: 23 %
Lymphs Abs: 2.4 10*3/uL (ref 0.7–4.0)
MCH: 29.3 pg (ref 26.0–34.0)
MCHC: 34 g/dL (ref 30.0–36.0)
MCV: 86.2 fL (ref 80.0–100.0)
Monocytes Absolute: 0.6 10*3/uL (ref 0.1–1.0)
Monocytes Relative: 6 %
Neutro Abs: 7.2 10*3/uL (ref 1.7–7.7)
Neutrophils Relative %: 69 %
Platelets: 142 10*3/uL — ABNORMAL LOW (ref 150–400)
RBC: 6.18 MIL/uL — ABNORMAL HIGH (ref 4.22–5.81)
RDW: 13.2 % (ref 11.5–15.5)
WBC: 10.6 10*3/uL — ABNORMAL HIGH (ref 4.0–10.5)
nRBC: 0 % (ref 0.0–0.2)

## 2021-04-11 LAB — PROTIME-INR
INR: 1.1 (ref 0.8–1.2)
Prothrombin Time: 13.8 seconds (ref 11.4–15.2)

## 2021-04-11 LAB — TROPONIN I (HIGH SENSITIVITY)
Troponin I (High Sensitivity): 6 ng/L (ref <18)
Troponin I (High Sensitivity): 6 ng/L (ref <18)

## 2021-04-11 LAB — RESP PANEL BY RT-PCR (FLU A&B, COVID) ARPGX2
Influenza A by PCR: NEGATIVE
Influenza B by PCR: NEGATIVE
SARS Coronavirus 2 by RT PCR: NEGATIVE

## 2021-04-11 LAB — ETHANOL: Alcohol, Ethyl (B): <10 mg/dL (ref <10)

## 2021-04-11 LAB — LACTIC ACID, PLASMA: Lactic Acid, Venous: 1.1 mmol/L (ref 0.5–1.9)

## 2021-04-11 MED ORDER — LACTATED RINGERS IV BOLUS
500.0000 mL | Freq: Once | INTRAVENOUS | Status: AC
  Administered 2021-04-11: 500 mL via INTRAVENOUS

## 2021-04-11 NOTE — ED Triage Notes (Addendum)
EMS arrival. [employer] dog. Witnessed fall/head strike. Negative LOC. Eliquis. Lives alone. Neighbor called. AxO4. RA. Occurred at 1830. ?

## 2021-04-11 NOTE — ED Provider Notes (Signed)
?David Hendricks ?Provider Note ? ? ?CSN: [id number] ?Arrival date & time: 04/11/21  1931 ? ?  ? ?History ? ?Chief Complaint  ?Patient presents with  ? Fall  ? ? ?David Hendricks is a 71 y.o. male. ? ? ?Fall ?Patient presents after a fall.  Fall occurred approximately 1 hour prior to arrival.  At the time he was walking his dog.  His dog is a 47-year-old boxer.  They dog ran around him causing the leash to wrap around his legs.  This subsequently caused him to fall backwards.  He fell backwards down a grassy hill.  He struck his bottom, posterior shoulders, and posterior head.  He did not lose consciousness.  He was able to stand up and walk back to his home.  He is on Eliquis for atrial fibrillation.  Although patient does not believe that he injured anything during the fall.  He has been concerned about symptoms of postural lightheadedness and dizziness over the past week.  He denies any recent vomiting or diarrhea.  He does take blood pressure medications and these were taken in the mornings.  His last doses were this morning. ? ?  ? ?Home Medications ?Prior to Admission medications   ?Medication Sig Start Date End Date Taking? Authorizing Provider  ?acetaminophen (TYLENOL) 500 MG tablet Take 1,000 mg by mouth every 6 (six) hours as needed for mild pain.    [provider]  ?apixaban (ELIQUIS) 5 MG TABS tablet Take 1 tablet (5 mg total) by mouth 2 (two) times daily. 02/15/21   Vickie Epley, MD  ?aspirin EC 81 MG tablet Take 1 tablet (81 mg total) by mouth daily. Swallow whole. 04/27/20   Vickie Epley, MD  ?atorvastatin (LIPITOR) 80 MG tablet Take 1 tablet (80 mg total) by mouth daily. 03/08/20   Angiulli, Lavon Paganini, PA-C  ?Baclofen 5 MG TABS Take 5 mg by mouth 4 (four) times daily. 03/31/20   Lovorn, Jinny Blossom, MD  ?Blood Pressure Monitoring (COMFORT TOUCH BP CUFF/LARGE) MISC 1 Device by Other route 2 (two) times daily. Use to check Blood Pressure twice daily. Dx: I10 02/22/21    Wardell Honour, MD  ?citalopram (CELEXA) 20 MG tablet Take 20 mg by mouth daily.    [provider]  ?gabapentin (NEURONTIN) 300 MG capsule Take 1 capsule (300 mg total) by mouth 2 (two) times daily. 02/15/21   Wardell Honour, MD  ?melatonin 3 MG TABS tablet Take 1 tablet (3 mg total) by mouth at bedtime. 03/08/20   Angiulli, Lavon Paganini, PA-C  ?methimazole (TAPAZOLE) 5 MG tablet TAKE 1 TABLET(5 MG) BY MOUTH DAILY ?Patient taking differently: Take 5 mg by mouth daily. 03/08/20   Angiulli, Lavon Paganini, PA-C  ?metoCLOPramide (REGLAN) 5 MG tablet Take 1 tablet (5 mg total) by mouth 2 (two) times daily as needed for nausea. 01/20/21   Fargo, Amy E, NP  ?metoprolol succinate (TOPROL XL) 25 MG 24 hr tablet Take 1 tablet (25 mg total) by mouth daily. Please make yearly appt with Dr. Quentin Ore for April 2023 for future refills. Thank you 1st attempt 02/15/21   Vickie Epley, MD  ?tamsulosin (FLOMAX) 0.4 MG CAPS capsule Take 2 capsules (0.8 mg total) by mouth daily after supper. 01/20/21   Fargo, Amy E, NP  ?traZODone (DESYREL) 100 MG tablet TAKE 1 TABLET BY MOUTH AT BEDTIME 02/25/21   Ngetich, Dinah C, NP  ?   ? ?Allergies    ?Patient has no  known allergies.   ? ?Review of Systems   ?Review of Systems  ?Neurological:  Positive for dizziness and light-headedness.  ?All other systems reviewed and are negative. ? ?Physical Exam ?Updated Vital Signs ?BP 107/69   Pulse 95   Temp 98.2 ?F (36.8 ?C)   Resp 16   SpO2 95%  ?Physical Exam ?Vitals and nursing note reviewed.  ?Constitutional:   ?   General: He is not in acute distress. ?   Appearance: He is well-developed. He is not ill-appearing, toxic-appearing or diaphoretic.  ?HENT:  ?   Head: Normocephalic and atraumatic.  ?   Right Ear: External ear normal.  ?   Left Ear: External ear normal.  ?   Nose: Nose normal.  ?   Mouth/Throat:  ?   Mouth: Mucous membranes are moist.  ?   Pharynx: Oropharynx is clear.  ?Eyes:  ?   Extraocular Movements: Extraocular movements intact.   ?   Conjunctiva/sclera: Conjunctivae normal.  ?Cardiovascular:  ?   Rate and Rhythm: Normal rate and regular rhythm.  ?   Heart sounds: No murmur heard. ?Pulmonary:  ?   Effort: Pulmonary effort is normal. No respiratory distress.  ?   Breath sounds: Normal breath sounds. No wheezing or rales.  ?Abdominal:  ?   Palpations: Abdomen is soft.  ?   Tenderness: There is no abdominal tenderness.  ?Musculoskeletal:     ?   General: No swelling. Normal range of motion.  ?   Cervical back: Normal range of motion and neck supple.  ?   Right lower leg: No edema.  ?   Left lower leg: No edema.  ?Skin: ?   General: Skin is warm and dry.  ?   Capillary Refill: Capillary refill takes less than 2 seconds.  ?   Coloration: Skin is not jaundiced or pale.  ?Neurological:  ?   General: No focal deficit present.  ?   Mental Status: He is alert and oriented to person, place, and time.  ?   Cranial Nerves: No cranial nerve deficit.  ?   Sensory: No sensory deficit.  ?   Motor: No weakness.  ?   Coordination: Coordination normal.  ?Psychiatric:     ?   Mood and Affect: Mood normal.     ?   Behavior: Behavior normal.     ?   Thought Content: Thought content normal.     ?   Judgment: Judgment normal.  ? ? ?ED Results / Procedures / Treatments   ?Labs ?(all labs ordered are listed, but only abnormal results are displayed) ?Labs Reviewed  ?CBC WITH DIFFERENTIAL/PLATELET - Abnormal; Notable for the following components:  ?    Result Value  ? WBC 10.6 (*)   ? RBC 6.18 (*)   ? Hemoglobin 18.1 (*)   ? HCT 53.3 (*)   ? Platelets 142 (*)   ? All other components within normal limits  ?BASIC METABOLIC PANEL - Abnormal; Notable for the following components:  ? Glucose, Bld 103 (*)   ? All other components within normal limits  ?RESP PANEL BY RT-PCR (FLU A&B, COVID) ARPGX2  ?PROTIME-INR  ?ETHANOL  ?URINALYSIS, ROUTINE W REFLEX MICROSCOPIC  ?LACTIC ACID, PLASMA  ?TSH  ?MAGNESIUM  ?TROPONIN I (HIGH SENSITIVITY)  ?TROPONIN I (HIGH SENSITIVITY)   ? ? ?EKG ?None ? ?Radiology ?CT Head Wo Contrast ? ?Result Date: 04/11/2021 ?CLINICAL DATA:  Trauma fall EXAM: CT HEAD WITHOUT CONTRAST TECHNIQUE: Contiguous axial images were obtained  from the base of the skull through the vertex without intravenous contrast. RADIATION DOSE REDUCTION: This exam was performed according to the departmental dose-optimization program which includes automated exposure control, adjustment of the mA and/or kV according to patient size and/or use of iterative reconstruction technique. COMPARISON:  CT brain 01/12/2021, MRI brain 01/12/2021 FINDINGS: Brain: No acute territorial infarction, hemorrhage or intracranial mass. Small chronic infarct in the left cerebellum and bilateral frontal lobes. Mild atrophy. Moderate chronic small vessel ischemic changes of the white matter. Stable ventricle size. Asymmetrically small right cerebellum as before. Vascular: No hyperdense vessels.  Carotid vascular calcification. Skull: Normal. Negative for fracture or focal lesion. Sinuses/Orbits: No acute finding. Other: None IMPRESSION: 1. No CT evidence for acute intracranial abnormality. 2. Mild atrophy and moderate chronic small vessel ischemic changes of the white matter. Multifocal small chronic infarcts Electronically Signed   By: Donavan Foil M.D.   On: 04/11/2021 20:07  ? ?CT CERVICAL SPINE WO CONTRAST ? ?Result Date: 04/11/2021 ?CLINICAL DATA:  Neck trauma.  Witnessed fall with head strike. EXAM: CT CERVICAL SPINE WITHOUT CONTRAST TECHNIQUE: Multidetector CT imaging of the cervical spine was performed without intravenous contrast. Multiplanar CT image reconstructions were also generated. RADIATION DOSE REDUCTION: This exam was performed according to the departmental dose-optimization program which includes automated exposure control, adjustment of the mA and/or kV according to patient size and/or use of iterative reconstruction technique. COMPARISON:  11/30/2020 FINDINGS: Alignment: Normal. Skull base  and vertebrae: No acute fracture. No primary bone lesion or focal pathologic process. Soft tissues and spinal canal: No prevertebral soft tissue swelling. No abnormal paraspinal soft tissue mass or infiltration.

## 2021-04-11 NOTE — Progress Notes (Signed)
Chaplain responded to this Level II fall on thinners.  Patient arrived and is being evaluated.  EMT indicated patient was home alone no family is aware.  Patient is in CT.  Chaplain available for support as needed. ?[place], [place]. ? ? ? 04/11/21 1954  ?Clinical Encounter Type  ?Visited With Health care provider;Patient not available  ?Visit Type Trauma  ?Referral From Nurse  ?Consult/Referral To Chaplain  ? ? ?

## 2021-04-12 NOTE — Progress Notes (Signed)
Orthopedic Tech Progress Note ?Patient Details:  ?David Hendricks ?March 19, 1950 ?[id number] ? ?Patient ID: David Hendricks, male   DOB: 1950/03/09, 71 y.o.   MRN: [id number] ?I attended trauma page ?Karolee Stamps ?04/12/2021, 4:54 AM ? ?

## 2021-04-13 ENCOUNTER — Encounter: Payer: Self-pay | Admitting: Family

## 2021-04-13 ENCOUNTER — Ambulatory Visit (INDEPENDENT_AMBULATORY_CARE_PROVIDER_SITE_OTHER): Payer: Medicare Other | Admitting: Family

## 2021-04-13 VITALS — BP 100/60 | HR 71 | Temp 96.2°F | Resp 18 | Ht 73.0 in | Wt 209.6 lb

## 2021-04-13 DIAGNOSIS — I639 Cerebral infarction, unspecified: Secondary | ICD-10-CM

## 2021-04-13 DIAGNOSIS — L989 Disorder of the skin and subcutaneous tissue, unspecified: Secondary | ICD-10-CM | POA: Diagnosis not present

## 2021-04-13 DIAGNOSIS — R42 Dizziness and giddiness: Secondary | ICD-10-CM | POA: Diagnosis not present

## 2021-04-13 DIAGNOSIS — W19XXXD Unspecified fall, subsequent encounter: Secondary | ICD-10-CM | POA: Diagnosis not present

## 2021-04-13 DIAGNOSIS — D72829 Elevated white blood cell count, unspecified: Secondary | ICD-10-CM | POA: Diagnosis not present

## 2021-04-13 DIAGNOSIS — Y92009 Unspecified place in unspecified non-institutional (private) residence as the place of occurrence of the external cause: Secondary | ICD-10-CM | POA: Diagnosis not present
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
## 2021-04-14 LAB — CBC WITH DIFFERENTIAL/PLATELET
Absolute Monocytes: 393 cells/uL (ref 200–950)
Basophils Absolute: 92 cells/uL (ref 0–200)
Basophils Relative: 1.2 %
Eosinophils Absolute: 231 cells/uL (ref 15–500)
Eosinophils Relative: 3 %
HCT: 54.7 % — ABNORMAL HIGH (ref 38.5–50.0)
Hemoglobin: 18.3 g/dL — ABNORMAL HIGH (ref 13.2–17.1)
Lymphs Abs: 2225 cells/uL (ref 850–3900)
MCH: 29.2 pg (ref 27.0–33.0)
MCHC: 33.5 g/dL (ref 32.0–36.0)
MCV: 87.2 fL (ref 80.0–100.0)
MPV: 13.2 fL — ABNORMAL HIGH (ref 7.5–12.5)
Monocytes Relative: 5.1 %
Neutro Abs: 4759 cells/uL (ref 1500–7800)
Neutrophils Relative %: 61.8 %
Platelets: 146 10*3/uL (ref 140–400)
RBC: 6.27 10*6/uL — ABNORMAL HIGH (ref 4.20–5.80)
RDW: 13.1 % (ref 11.0–15.0)
Total Lymphocyte: 28.9 %
WBC: 7.7 10*3/uL (ref 3.8–10.8)

## 2021-04-14 LAB — BASIC METABOLIC PANEL WITH GFR
BUN: 10 mg/dL (ref 7–25)
CO2: 29 mmol/L (ref 20–32)
Calcium: 9.4 mg/dL (ref 8.6–10.3)
Chloride: 103 mmol/L (ref 98–110)
Creat: 1.14 mg/dL (ref 0.70–1.28)
Glucose, Bld: 76 mg/dL (ref 65–99)
Potassium: 4.6 mmol/L (ref 3.5–5.3)
Sodium: 142 mmol/L (ref 135–146)
eGFR: 69 mL/min/{1.73_m2} (ref >=60)

## 2021-04-21 NOTE — Progress Notes (Signed)
[facility] Summary Report / Loop Recorder 

## 2021-05-08 NOTE — Progress Notes (Deleted)
Cardiology Office Note Date:  05/08/2021  Patient ID:  David Hendricks, David Hendricks 1950/07/19, MRN [id number] PCP:  Wardell Honour, MD  Cardiologist:  Dr. Irish Lack Electrophysiologist: Dr. Quentin Ore  ***refresh   Chief Complaint: *** ? Planned f/u  History of Present Illness: David Hendricks is a 71 y.o. male with history of stroke, AFib, CAD (PCI 2018), HLD, hyperthyroidism, new CM  He comes in today to be seen for Dr. Quentin Ore, last seen by him April 2022 after noting AFib on his loop and being started on [facility].  Pt with minimal if any symptoms with his AFib.  He was hospitalized 01/12/21 - 01/13/21 with another stroke in the context of missing his medicines intermittently, including his Eliquis.  Seems perhaps forgets to, uncertain what he is supposed t take.  TTE this admission noted LVEF reduced 40-45%. EKG this admission was Afib 102 PT recommended SNF, though pt insisted on going home.  He had an ER visit 04/11/21 after a fall getting tripped up on his dog.  CT of the head was OK  He had persistent dizziness , neg orthostatic, recommended good hydration  March transmission with 99.9% burden, MD recommended APP visit to work on rate control  *** eliquis, bleeding, dose *** burden, rates *** med compliance? Help at home? *** new CM, needs cards f/u, ? Meds *** volume  Device information MDT LINQ II, implanted 02/20/2020, cryptogenic stroke  AFib/AAD hx AFib Diagnosed via loop post stroke 04/2020 No AAD to date  Past Medical History:  Diagnosis Date   Aortic atherosclerosis ([place])    Arthritis    Benign prostatic hyperplasia (BPH) with urinary urgency    COPD (chronic obstructive pulmonary disease) ([place])    Coronary artery disease    10/18 PCI/DES to pLAD, mild nonobstructive disease in the Lcx/RCA. Normal EF.    Depression with anxiety    Dyslipidemia    Dysrhythmia 2016   irregular heartbeat   Gallstones    GERD (gastroesophageal reflux disease)    History of kidney stones     Hyperthyroidism    Leg cramps    Multiple pulmonary nodules    Myocardial infarction ([place])    2018   Pneumonia    Seasonal allergies    Stroke [facility])    Vitamin B 12 deficiency     Past Surgical History:  Procedure Laterality Date   COLONOSCOPY     CORONARY ANGIOPLASTY  10/09/2016   CORONARY STENT INTERVENTION N/A 10/09/2016   Procedure: CORONARY STENT INTERVENTION;  Surgeon: Nelva Bush, MD;  Location: [place] [facility];  Service: Cardiovascular;  Laterality: N/A;   CYSTOSCOPY WITH RETROGRADE PYELOGRAM, URETEROSCOPY AND STENT PLACEMENT Left 06/17/2019   Procedure: CYSTOSCOPY WITH RETROGRADE PYELOGRAM, URETEROSCOPY AND STENT PLACEMENT;  Surgeon: Robley Fries, MD;  [facility];  Service: Urology;  Laterality: Left;  1 HR   HOLMIUM LASER APPLICATION Left 09/16/3380   Procedure: HOLMIUM LASER APPLICATION;  Surgeon: Robley Fries, MD;  [facility];  Service: Urology;  Laterality: Left;   INTRAVASCULAR ULTRASOUND/IVUS N/A 10/09/2016   Procedure: Intravascular Ultrasound/IVUS;  Surgeon: Nelva Bush, MD;  Location: [place] [facility];  Service: Cardiovascular;  Laterality: N/A;   IR CHOLANGIOGRAM EXISTING TUBE  11/18/2018   IR PERC CHOLECYSTOSTOMY  11/16/2018   LEFT HEART CATH AND CORONARY ANGIOGRAPHY N/A 10/09/2016   Procedure: LEFT HEART CATH AND CORONARY ANGIOGRAPHY;  Surgeon: Nelva Bush, MD;  Location: [place] [facility];  Service: Cardiovascular;  Laterality: N/A;   LOOP RECORDER  INSERTION N/A 02/20/2020   Procedure: LOOP RECORDER INSERTION;  Surgeon: Vickie Epley, MD;  Location: [place] [facility];  Service: Cardiovascular;  Laterality: N/A;    Current Outpatient Medications  Medication Sig Dispense Refill   acetaminophen (TYLENOL) 500 MG tablet Take 1,000 mg by mouth every 6 (six) hours as needed for mild pain.     apixaban (ELIQUIS) 5 MG TABS tablet Take 1 tablet (5 mg total) by mouth 2 (two) times daily. 60 tablet 5   aspirin EC 81 MG tablet Take 1  tablet (81 mg total) by mouth daily. Swallow whole. 90 tablet 3   atorvastatin (LIPITOR) 80 MG tablet Take 1 tablet (80 mg total) by mouth daily. 30 tablet 3   Baclofen 5 MG TABS Take 5 mg by mouth 4 (four) times daily. 120 tablet 5   Blood Pressure Monitoring (COMFORT TOUCH BP CUFF/LARGE) MISC 1 Device by Other route 2 (two) times daily. Use to check Blood Pressure twice daily. Dx: I10 1 each 0   citalopram (CELEXA) 20 MG tablet Take 20 mg by mouth daily.     gabapentin (NEURONTIN) 300 MG capsule Take 1 capsule (300 mg total) by mouth 2 (two) times daily. 90 capsule 3   melatonin 3 MG TABS tablet Take 1 tablet (3 mg total) by mouth at bedtime. 30 tablet 0   methimazole (TAPAZOLE) 5 MG tablet TAKE 1 TABLET(5 MG) BY MOUTH DAILY 30 tablet 1   metoCLOPramide (REGLAN) 5 MG tablet Take 1 tablet (5 mg total) by mouth 2 (two) times daily as needed for nausea. 60 tablet 0   metoprolol succinate (TOPROL XL) 25 MG 24 hr tablet Take 1 tablet (25 mg total) by mouth daily. Please make yearly appt with Dr. Quentin Ore for April 2023 for future refills. Thank you 1st attempt 30 tablet 2   tamsulosin (FLOMAX) 0.4 MG CAPS capsule Take 2 capsules (0.8 mg total) by mouth daily after supper. 30 capsule 2   traZODone (DESYREL) 100 MG tablet TAKE 1 TABLET BY MOUTH AT BEDTIME 30 tablet 5   Current Facility-Administered Medications  Medication Dose Route Frequency Provider Last Rate Last Admin   cyanocobalamin ((VITAMIN B-12)) injection 1,000 mcg  1,000 mcg Intramuscular Q30 days Horald Pollen, MD   1,000 mcg at 08/29/19 1525    Allergies:   Patient has no known allergies.   Social History:  The patient  reports that he has been smoking cigarettes. He started smoking about 60 years ago. He has a 90.00 pack-year smoking history. He has never used smokeless tobacco. He reports that he does not currently use alcohol. He reports that he does not use drugs.   Family History:  The patient's family history includes  Alcohol abuse in his father; Cancer in his father.  ROS:  Please see the history of present illness.    All other systems are reviewed and otherwise negative.   PHYSICAL EXAM:  VS:  There were no vitals taken for this visit. BMI: There is no height or weight on file to calculate BMI. Well nourished, well developed, in no acute distress HEENT: normocephalic, atraumatic Neck: no JVD, carotid bruits or masses Cardiac:  *** RRR; no significant murmurs, no rubs, or gallops Lungs:  *** CTA b/l, no wheezing, rhonchi or rales Abd: soft, nontender MS: no deformity or *** atrophy Ext: *** no edema Skin: warm and dry, no rash Neuro:  No gross deficits appreciated Psych: euthymic mood, full affect  *** ILR site is stable, no  tethering or discomfort   EKG:  not done today  Device interrogation done today and reviewed by myself:  ***   01/12/2021: TTE  1. Technically difficult; pt in atrial fibrillation at time of study; LV  function appears to be mildly reduced.   2. Left ventricular ejection fraction, by estimation, is 40 to 45%. The  left ventricle has mildly decreased function. The left ventricle  demonstrates global hypokinesis. Left ventricular diastolic function could  not be evaluated.   3. Right ventricular systolic function is mildly reduced. The right  ventricular size is mildly enlarged.   4. The mitral valve is normal in structure. Trivial mitral valve  regurgitation. No evidence of mitral stenosis.   5. The aortic valve is tricuspid. Aortic valve regurgitation is trivial.  Aortic valve sclerosis is present, with no evidence of aortic valve  stenosis.   6. The inferior vena cava is normal in size with greater than 50%  respiratory variability, suggesting right atrial pressure of 3 mmHg.    Recent Labs: 12/31/2020: B Natriuretic Peptide 208.6 01/12/2021: ALT 14 04/11/2021: Magnesium 2.1; TSH 0.552 04/13/2021: BUN 10; Creat 1.14; Hemoglobin 18.3; Platelets 146; Potassium 4.6;  Sodium 142  01/12/2021: Direct LDL 89.3 01/13/2021: Cholesterol 130; HDL 34; LDL Cholesterol 81; Total CHOL/HDL Ratio 3.8; Triglycerides 76; VLDL 15   CrCl cannot be calculated (Patient's most recent lab result is older than the maximum 21 days allowed.).   Wt Readings from Last 3 Encounters:  04/13/21 209 lb 9.6 oz (95.1 kg)  02/15/21 214 lb 12.8 oz (97.4 kg)  12/31/20 216 lb 0.8 oz (98 kg)     Other studies reviewed: Additional studies/records reviewed today include: summarized above  ASSESSMENT AND PLAN:  Paroxysmal AFib CHA2DS2Vasc is 5, on *** Eliquis, appropriately dosed *** % burden *** rates  New CM ***  CAD ***  HTN ***  Disposition: F/u with ***  Current medicines are reviewed at length with the patient today.  The patient did not have any concerns regarding medicines.  Venetia Night, PA-C 05/08/2021 4:07 PM     [address] [place] [address] [phone] (office)  [phone] (fax)

## 2021-05-09 DIAGNOSIS — C44319 Basal cell carcinoma of skin of other parts of face: Secondary | ICD-10-CM | POA: Diagnosis not present

## 2021-05-09 DIAGNOSIS — C4401 Basal cell carcinoma of skin of lip: Secondary | ICD-10-CM | POA: Diagnosis not present

## 2021-05-09 DIAGNOSIS — D485 Neoplasm of uncertain behavior of skin: Secondary | ICD-10-CM | POA: Diagnosis not present

## 2021-05-12 ENCOUNTER — Encounter: Payer: Medicare Other | Admitting: Physician Assistant

## 2021-05-12 LAB — CUP PACEART REMOTE DEVICE CHECK
Date Time Interrogation Session: 20230503231534
Implantable Pulse Generator Implant Date: 20220211

## 2021-05-16 ENCOUNTER — Ambulatory Visit (INDEPENDENT_AMBULATORY_CARE_PROVIDER_SITE_OTHER): Payer: Medicare Other

## 2021-05-16 DIAGNOSIS — I639 Cerebral infarction, unspecified: Secondary | ICD-10-CM

## 2021-05-24 ENCOUNTER — Ambulatory Visit (HOSPITAL_COMMUNITY): Payer: Medicare Other | Admitting: Physician Assistant

## 2021-05-31 NOTE — Progress Notes (Signed)
[facility] Summary Report / Loop Recorder 

## 2021-06-08 ENCOUNTER — Ambulatory Visit: Payer: Medicare Other | Admitting: Family Medicine

## 2021-06-28 ENCOUNTER — Telehealth: Payer: Self-pay

## 2021-06-28 NOTE — Telephone Encounter (Signed)
Patient called and reported that he is having severe back pain with leg cramps. He states that he is very unsteady with walking and he has vomiting for the last month every day after dinner. No appointments is currently available for patient.

## 2021-06-30 NOTE — Telephone Encounter (Signed)
Called patient and he declined appointment. He stated he doesn't think it would be necessary to schedule appointment to be seen at this time.

## 2021-07-04 ENCOUNTER — Other Ambulatory Visit: Payer: Self-pay

## 2021-07-04 ENCOUNTER — Encounter (HOSPITAL_COMMUNITY): Payer: Self-pay

## 2021-07-04 ENCOUNTER — Emergency Department (HOSPITAL_COMMUNITY): Payer: Medicare Other

## 2021-07-04 ENCOUNTER — Emergency Department (HOSPITAL_COMMUNITY)
Admission: EM | Admit: 2021-07-04 | Discharge: 2021-07-04 | Disposition: A | Discharge status: Home / Self Care | Payer: Medicare Other | Attending: Student | Admitting: Student

## 2021-07-04 DIAGNOSIS — I2510 Atherosclerotic heart disease of native coronary artery without angina pectoris: Secondary | ICD-10-CM | POA: Insufficient documentation

## 2021-07-04 DIAGNOSIS — Z7901 Long term (current) use of anticoagulants: Secondary | ICD-10-CM | POA: Diagnosis not present

## 2021-07-04 DIAGNOSIS — M5441 Lumbago with sciatica, right side: Secondary | ICD-10-CM | POA: Diagnosis not present

## 2021-07-04 DIAGNOSIS — S3992XA Unspecified injury of lower back, initial encounter: Secondary | ICD-10-CM | POA: Diagnosis not present

## 2021-07-04 DIAGNOSIS — N200 Calculus of kidney: Secondary | ICD-10-CM | POA: Diagnosis not present

## 2021-07-04 DIAGNOSIS — Z7982 Long term (current) use of aspirin: Secondary | ICD-10-CM | POA: Diagnosis not present

## 2021-07-04 DIAGNOSIS — M545 Low back pain, unspecified: Secondary | ICD-10-CM | POA: Diagnosis present

## 2021-07-04 DIAGNOSIS — J449 Chronic obstructive pulmonary disease, unspecified: Secondary | ICD-10-CM | POA: Diagnosis not present

## 2021-07-04 DIAGNOSIS — I4891 Unspecified atrial fibrillation: Secondary | ICD-10-CM | POA: Diagnosis not present

## 2021-07-04 DIAGNOSIS — Z87442 Personal history of urinary calculi: Secondary | ICD-10-CM | POA: Insufficient documentation

## 2021-07-04 DIAGNOSIS — M4317 Spondylolisthesis, lumbosacral region: Secondary | ICD-10-CM | POA: Diagnosis not present

## 2021-07-04 DIAGNOSIS — Z79899 Other long term (current) drug therapy: Secondary | ICD-10-CM | POA: Insufficient documentation

## 2021-07-04 DIAGNOSIS — M549 Dorsalgia, unspecified: Secondary | ICD-10-CM | POA: Diagnosis not present

## 2021-07-04 DIAGNOSIS — M47817 Spondylosis without myelopathy or radiculopathy, lumbosacral region: Secondary | ICD-10-CM | POA: Diagnosis not present

## 2021-07-04 DIAGNOSIS — F17210 Nicotine dependence, cigarettes, uncomplicated: Secondary | ICD-10-CM | POA: Insufficient documentation

## 2021-07-04 DIAGNOSIS — R519 Headache, unspecified: Secondary | ICD-10-CM | POA: Diagnosis not present

## 2021-07-04 DIAGNOSIS — I10 Essential (primary) hypertension: Secondary | ICD-10-CM | POA: Diagnosis not present

## 2021-07-04 DIAGNOSIS — W19XXXA Unspecified fall, initial encounter: Secondary | ICD-10-CM | POA: Diagnosis not present

## 2021-07-04 DIAGNOSIS — R000 Tachycardia, unspecified: Secondary | ICD-10-CM | POA: Diagnosis not present

## 2021-07-04 MED ORDER — METHYLPREDNISOLONE 4 MG PO TBPK: ORAL_TABLET | ORAL | 0 refills | Status: DC

## 2021-07-04 MED ORDER — HYDROCODONE-ACETAMINOPHEN 5-325 MG PO TABS
1.0000 | ORAL_TABLET | ORAL | Status: DC | PRN
  Administered 2021-07-04: 1 via ORAL
  Filled 2021-07-04: qty 1

## 2021-07-04 MED ORDER — HYDROCODONE-ACETAMINOPHEN 5-325 MG PO TABS: 2.0000 | ORAL_TABLET | ORAL | 0 refills | Status: DC | PRN

## 2021-09-07 ENCOUNTER — Emergency Department (HOSPITAL_COMMUNITY): Payer: Medicare Other

## 2021-09-07 ENCOUNTER — Encounter (HOSPITAL_COMMUNITY): Payer: Self-pay | Admitting: Emergency Medicine

## 2021-09-07 ENCOUNTER — Other Ambulatory Visit: Payer: Self-pay

## 2021-09-07 ENCOUNTER — Emergency Department (HOSPITAL_COMMUNITY)
Admission: EM | Admit: 2021-09-07 | Discharge: 2021-09-07 | Disposition: A | Discharge status: Home / Self Care | Payer: Medicare Other | Attending: Emergency Medicine | Admitting: Emergency Medicine

## 2021-09-07 DIAGNOSIS — M25511 Pain in right shoulder: Secondary | ICD-10-CM | POA: Diagnosis not present

## 2021-09-07 DIAGNOSIS — S199XXA Unspecified injury of neck, initial encounter: Secondary | ICD-10-CM | POA: Diagnosis not present

## 2021-09-07 DIAGNOSIS — M25561 Pain in right knee: Secondary | ICD-10-CM | POA: Diagnosis not present

## 2021-09-07 DIAGNOSIS — W182XXA Fall in (into) shower or empty bathtub, initial encounter: Secondary | ICD-10-CM | POA: Diagnosis not present

## 2021-09-07 DIAGNOSIS — M549 Dorsalgia, unspecified: Secondary | ICD-10-CM | POA: Diagnosis not present

## 2021-09-07 DIAGNOSIS — M545 Low back pain, unspecified: Secondary | ICD-10-CM | POA: Diagnosis not present

## 2021-09-07 DIAGNOSIS — Z7901 Long term (current) use of anticoagulants: Secondary | ICD-10-CM | POA: Insufficient documentation

## 2021-09-07 DIAGNOSIS — I700 Atherosclerosis of aorta: Secondary | ICD-10-CM | POA: Diagnosis not present

## 2021-09-07 DIAGNOSIS — R519 Headache, unspecified: Secondary | ICD-10-CM | POA: Insufficient documentation

## 2021-09-07 DIAGNOSIS — W19XXXA Unspecified fall, initial encounter: Secondary | ICD-10-CM

## 2021-09-07 DIAGNOSIS — E041 Nontoxic single thyroid nodule: Secondary | ICD-10-CM | POA: Diagnosis not present

## 2021-09-07 DIAGNOSIS — S0990XA Unspecified injury of head, initial encounter: Secondary | ICD-10-CM | POA: Diagnosis not present

## 2021-09-07 DIAGNOSIS — S3993XA Unspecified injury of pelvis, initial encounter: Secondary | ICD-10-CM | POA: Diagnosis not present

## 2021-09-07 DIAGNOSIS — J439 Emphysema, unspecified: Secondary | ICD-10-CM | POA: Diagnosis not present

## 2021-09-07 LAB — I-STAT CHEM 8, ED
BUN: 15 mg/dL (ref 8–23)
Calcium, Ion: 0.93 mmol/L — ABNORMAL LOW (ref 1.15–1.40)
Chloride: 103 mmol/L (ref 98–111)
Creatinine, Ser: 1 mg/dL (ref 0.61–1.24)
Glucose, Bld: 94 mg/dL (ref 70–99)
HCT: 52 % (ref 39.0–52.0)
Hemoglobin: 17.7 g/dL — ABNORMAL HIGH (ref 13.0–17.0)
Potassium: 4.3 mmol/L (ref 3.5–5.1)
Sodium: 139 mmol/L (ref 135–145)
TCO2: 25 mmol/L (ref 22–32)

## 2021-09-07 LAB — CBC
HCT: 53 % — ABNORMAL HIGH (ref 39.0–52.0)
Hemoglobin: 18 g/dL — ABNORMAL HIGH (ref 13.0–17.0)
MCH: 29.2 pg (ref 26.0–34.0)
MCHC: 34 g/dL (ref 30.0–36.0)
MCV: 86 fL (ref 80.0–100.0)
Platelets: 137 10*3/uL — ABNORMAL LOW (ref 150–400)
RBC: 6.16 MIL/uL — ABNORMAL HIGH (ref 4.22–5.81)
RDW: 13.2 % (ref 11.5–15.5)
WBC: 10.2 10*3/uL (ref 4.0–10.5)
nRBC: 0 % (ref 0.0–0.2)

## 2021-09-07 LAB — URINALYSIS, ROUTINE W REFLEX MICROSCOPIC
Bilirubin Urine: NEGATIVE
Glucose, UA: NEGATIVE mg/dL
Hgb urine dipstick: NEGATIVE
Ketones, ur: NEGATIVE mg/dL
Leukocytes,Ua: NEGATIVE
Nitrite: NEGATIVE
Protein, ur: NEGATIVE mg/dL
Specific Gravity, Urine: 1.016 (ref 1.005–1.030)
pH: 5 (ref 5.0–8.0)

## 2021-09-07 LAB — COMPREHENSIVE METABOLIC PANEL
ALT: 14 U/L (ref 0–44)
AST: 20 U/L (ref 15–41)
Albumin: 3.4 g/dL — ABNORMAL LOW (ref 3.5–5.0)
Alkaline Phosphatase: 87 U/L (ref 38–126)
Anion gap: 9 (ref 5–15)
BUN: 11 mg/dL (ref 8–23)
CO2: 24 mmol/L (ref 22–32)
Calcium: 9.1 mg/dL (ref 8.9–10.3)
Chloride: 106 mmol/L (ref 98–111)
Creatinine, Ser: 1.07 mg/dL (ref 0.61–1.24)
GFR, Estimated: >60 mL/min (ref >60)
Glucose, Bld: 98 mg/dL (ref 70–99)
Potassium: 4.5 mmol/L (ref 3.5–5.1)
Sodium: 139 mmol/L (ref 135–145)
Total Bilirubin: 0.7 mg/dL (ref 0.3–1.2)
Total Protein: 5.8 g/dL — ABNORMAL LOW (ref 6.5–8.1)

## 2021-09-07 LAB — PROTIME-INR
INR: 0.9 (ref 0.8–1.2)
Prothrombin Time: 12.3 seconds (ref 11.4–15.2)

## 2021-09-07 LAB — ETHANOL: Alcohol, Ethyl (B): <10 mg/dL (ref <10)

## 2021-09-07 NOTE — ED Notes (Signed)
Trauma Response Nurse Documentation   David Hendricks is a 71 y.o. male arriving to [facility] ED via David Hendricks) EMS  On Eliquis (apixaban) daily. Trauma was activated as a Level 2 by David Hendricks based on the following trauma criteria Elderly patients > 65 with head trauma on anti-coagulation (excluding ASA). Trauma team at the bedside on patient arrival.   Patient cleared for CT by Dr. Oswald Hendricks. Pt transported to CT with trauma response nurse present to monitor. RN remained with the patient throughout their absence from the department for clinical observation.   GCS 15.  History   Past Medical History:  Diagnosis Date   Aortic atherosclerosis ([facility])    Arthritis    Benign prostatic hyperplasia (BPH) with urinary urgency    COPD (chronic obstructive pulmonary disease) ([place])    Coronary artery disease    10/18 PCI/DES to pLAD, mild nonobstructive disease in the Lcx/RCA. Normal EF.    Depression with anxiety    Dyslipidemia    Dysrhythmia 2016   irregular heartbeat   Gallstones    GERD (gastroesophageal reflux disease)    History of kidney stones    Hyperthyroidism    Leg cramps    Multiple pulmonary nodules    Myocardial infarction ([place])    2018   Pneumonia    Seasonal allergies    Stroke David Hendricks)    Vitamin B 12 deficiency      Past Surgical History:  Procedure Laterality Date   COLONOSCOPY     CORONARY ANGIOPLASTY  10/09/2016   CORONARY STENT INTERVENTION N/A 10/09/2016   Procedure: CORONARY STENT INTERVENTION;  Surgeon: David Bush, MD;  Location: [place] [facility];  Service: Cardiovascular;  Laterality: N/A;   CYSTOSCOPY WITH RETROGRADE PYELOGRAM, URETEROSCOPY AND STENT PLACEMENT Left 06/17/2019   Procedure: CYSTOSCOPY WITH RETROGRADE PYELOGRAM, URETEROSCOPY AND STENT PLACEMENT;  Surgeon: David Fries, MD;  [facility];  Service: Urology;  Laterality: Left;  1 HR   HOLMIUM LASER APPLICATION Left 06/13/3327   Procedure: HOLMIUM LASER APPLICATION;  Surgeon: David Fries, MD;  [facility];  Service: Urology;  Laterality: Left;   INTRAVASCULAR ULTRASOUND/IVUS N/A 10/09/2016   Procedure: Intravascular Ultrasound/IVUS;  Surgeon: David Bush, MD;  Location: [place] [facility];  Service: Cardiovascular;  Laterality: N/A;   IR CHOLANGIOGRAM EXISTING TUBE  11/18/2018   IR PERC CHOLECYSTOSTOMY  11/16/2018   LEFT HEART CATH AND CORONARY ANGIOGRAPHY N/A 10/09/2016   Procedure: LEFT HEART CATH AND CORONARY ANGIOGRAPHY;  Surgeon: David Bush, MD;  Location: [place] [facility];  Service: Cardiovascular;  Laterality: N/A;   LOOP RECORDER INSERTION N/A 02/20/2020   Procedure: LOOP RECORDER INSERTION;  Surgeon: David Epley, MD;  Location: [place] [facility];  Service: Cardiovascular;  Laterality: N/A;       Initial Focused Assessment (If applicable, or please see trauma documentation): Airway: intact Breathing: spontaneous and unlabored  Circulation: no bleeding noted  CT's Completed:   CT Head and CT C-Spine   Interventions:  Trauma labs DG chest DG pelvis CT head CT c-spine  Plan for disposition:  Unknown at this time.  Consults completed:  none at 7.  Event Summary: Patient brought in by [place]. Patient fell while getting out of the shower this evening. Denies LOC, A&Ox4, GCS 15. Patient complaining of right knee pain. No other complaints, patient does endorse hitting his head when he fell. Patient placed in c-collar by ED staff. Transported to CT by TRN.  MTP Summary (If applicable):  Bedside handoff with ED RN David Hendricks.    David Hendricks  Trauma Response RN  Please call TRN at [phone] for further assistance.

## 2021-09-07 NOTE — ED Triage Notes (Signed)
Pt to ED via [facility] c/o back pain after fall getting out of the shower. Denies LOC. Pt is on Eliquis. Pt unsure of hitting his head. A&O x 4 in triage.

## 2021-09-07 NOTE — ED Provider Notes (Signed)
[facility] EMERGENCY DEPARTMENT Provider Note   CSN: [id number] Arrival date & time: 09/07/21  1851     History Chief Complaint  Patient presents with   Fall    HPI David Hendricks is a 71 y.o. male presenting for chief complaint of ground-level fall.  Patient is an otherwise healthy 71 year old male who fell out of the shower when attempting to get out.  He is on Eliquis.  He was treated as a level 2 trauma activation.  He endorses right-sided head pain radiating down into his right shoulder.  He has tenderness throughout his back as well.  He denies fevers or chills nausea vomiting, syncope shortness of breath..   Patient's recorded medical, surgical, social, medication list and allergies were reviewed in the Snapshot window as part of the initial history.   Review of Systems   Review of Systems  Constitutional:  Negative for chills and fever.  HENT:  Negative for ear pain and sore throat.   Eyes:  Negative for pain and visual disturbance.  Respiratory:  Negative for cough and shortness of breath.   Cardiovascular:  Negative for chest pain and palpitations.  Gastrointestinal:  Negative for abdominal pain and vomiting.  Genitourinary:  Negative for dysuria and hematuria.  Musculoskeletal:  Negative for arthralgias and back pain.  Skin:  Negative for color change and rash.  Neurological:  Negative for seizures and syncope.  All other systems reviewed and are negative.   Physical Exam Updated Vital Signs BP 106/77   Pulse 83   Resp 20   Ht '6\' 1"'$  (1.854 m)   Wt 90.7 kg   SpO2 96%   BMI 26.39 kg/m  Physical Exam Physical Exam  Neurologic: GCS 15, motor intact in all four extremities, sensory intact in all 4 extremities  Head: Pupils are 57m, equally round and reactive to light, patient has no obvious facial trauma, no hemotympanum  Neck: patient has no midline neck tenderness, no obvious injuries.  Thorax: Patient has stable clavicles, stable thorax with  bilateral chest rise and breath sounds heard.  No penetrating thoracic injury.  CV/Pulm: RRR, no audible murmer/rubs/gallops, CTAB  Abdomen: Patient has no abdominal distention, no penetrating abdominal injury.  Back: Patient has no midline spinal tenderness in the thoracic and lumbar spine, patient has no paraspinal tenderness bilaterally.  Pelvis: Patient has a stable pelvis to compression with palpable femoral pulses.  Extremities:Patient's upper extremities with no obvious injury or abnormality, radial pulses present. Patient's lower extremities with no obvious injury or abnormality, tibial pulses present.    ED Course/ Medical Decision Making/ A&P    Procedures Procedures   Medications Ordered in ED Medications - No data to display Medical Decision Making:    GAthens Lebeauis a 71y.o. male who presented to the ED today with a moderate mechanisma trauma, detailed above.    Patient placed on continuous vitals and telemetry monitoring while in ED which was reviewed periodically.  Handoff received from EMS team Given this mechanism of trauma, a full physical exam was performed. Notably, patient was HDS in NAD.   Reviewed and confirmed nursing documentation for past medical history, family history, social history.    Initial Assessment/Plan:   This is a patient presenting with a moderate mechanism trauma.  As such, I have considered intracranial injuries including intracranial hemorrhage, intrathoracic injuries including blunt myocardial or blunt lung injury, blunt abdominal injuries including aortic dissection, bladder injury, spleen injury, liver injury and I have considered orthopedic injuries  including extremity or spinal injury.   With the patient's presentation of moderate mechanism trauma but an otherwise reassuring exam, patient warrants targeted evaluation for potential traumatic injuries. Will proceed with targeted evaluation for potential injuries. Will proceed with CT head, CT  C-spine, chest and pelvis x-ray, right knee x-ray. Objective evaluation resulted with no acute pathology.     Final Reassessment and Plan:   Reevaluated patient after extensive time.  In emergency department.  He feels grossly symptomatically improved.  He states he would like to be discharged home.  He is having ongoing back pain but he states that this is chronic in nature and no worse today.  Shared medical decision making regarding repeat cross-sectional imaging and patient would like to hold off at this time.  He again states he would just like to be discharged.  He redescribed his fall and states that he lost balance stepping out of the shower.  Patient otherwise feels that he would be able to ambulate and states that his pain is controlled at this time.  Patient denies fevers or chills nausea or vomiting, syncope shortness of breath.  Patient discharged with plan for close outpatient reassessment by PCP within 48 hours.     Clinical Impression:  1. Fall, initial encounter      Discharge   Final Clinical Impression(s) / ED Diagnoses Final diagnoses:  Fall, initial encounter    Rx / DC Orders ED Discharge Orders     None         Tretha Sciara, MD 09/07/21 2027

## 2021-09-28 NOTE — Progress Notes (Signed)
Dear [name],    Please clarify the medical condition(s) necessitating the order for  Ethanol Drug Screening [phone]) laboratory services.   Medical record documentation maintained by the performing physician should clearly indicate the medical necessity of the service being performed.     Please exercise your independent, professional judgment when responding. A specific answer is not anticipated or expected.   Thank You,  [place] [phone]   Ethanol screening ordered as part of trauma evaluation for potential etiologies of fall.

## 2021-11-01 ENCOUNTER — Other Ambulatory Visit: Payer: Self-pay | Admitting: Family

## 2021-11-01 DIAGNOSIS — F5101 Primary insomnia: Secondary | ICD-10-CM

## 2021-11-02 NOTE — Telephone Encounter (Signed)
Patient has request refill on medication Trazodone. Patient medication last refilled 02/25/2021. Patient medication pend and sent to PCP Sabra Heck Lillette Boxer, MD for approval.

## 2022-02-01 ENCOUNTER — Emergency Department (HOSPITAL_COMMUNITY)
Admission: EM | Admit: 2022-02-01 | Discharge: 2022-02-01 | Discharge status: Against Medical Advice | Payer: Medicare Other | Attending: Emergency Medicine | Admitting: Emergency Medicine

## 2022-02-01 ENCOUNTER — Emergency Department (HOSPITAL_COMMUNITY): Payer: Medicare Other

## 2022-02-01 ENCOUNTER — Other Ambulatory Visit: Payer: Self-pay

## 2022-02-01 DIAGNOSIS — Z5321 Procedure and treatment not carried out due to patient leaving prior to being seen by health care provider: Secondary | ICD-10-CM | POA: Insufficient documentation

## 2022-02-01 DIAGNOSIS — R0602 Shortness of breath: Secondary | ICD-10-CM | POA: Insufficient documentation

## 2022-02-01 DIAGNOSIS — R079 Chest pain, unspecified: Secondary | ICD-10-CM | POA: Diagnosis not present

## 2022-02-01 DIAGNOSIS — U071 COVID-19: Secondary | ICD-10-CM | POA: Diagnosis not present

## 2022-02-01 DIAGNOSIS — R0789 Other chest pain: Secondary | ICD-10-CM | POA: Insufficient documentation

## 2022-02-01 DIAGNOSIS — Z7901 Long term (current) use of anticoagulants: Secondary | ICD-10-CM | POA: Diagnosis not present

## 2022-02-01 DIAGNOSIS — I10 Essential (primary) hypertension: Secondary | ICD-10-CM | POA: Diagnosis not present

## 2022-02-01 DIAGNOSIS — R059 Cough, unspecified: Secondary | ICD-10-CM | POA: Diagnosis present

## 2022-02-01 DIAGNOSIS — J449 Chronic obstructive pulmonary disease, unspecified: Secondary | ICD-10-CM | POA: Insufficient documentation

## 2022-02-01 DIAGNOSIS — R000 Tachycardia, unspecified: Secondary | ICD-10-CM | POA: Diagnosis not present

## 2022-02-01 DIAGNOSIS — I959 Hypotension, unspecified: Secondary | ICD-10-CM | POA: Diagnosis not present

## 2022-02-01 DIAGNOSIS — I4891 Unspecified atrial fibrillation: Secondary | ICD-10-CM | POA: Diagnosis not present

## 2022-02-01 DIAGNOSIS — R519 Headache, unspecified: Secondary | ICD-10-CM | POA: Diagnosis not present

## 2022-02-01 LAB — CBC WITH DIFFERENTIAL/PLATELET
Abs Immature Granulocytes: 0.01 10*3/uL (ref 0.00–0.07)
Basophils Absolute: 0 10*3/uL (ref 0.0–0.1)
Basophils Relative: 1 %
Eosinophils Absolute: 0.2 10*3/uL (ref 0.0–0.5)
Eosinophils Relative: 4 %
HCT: 52.2 % — ABNORMAL HIGH (ref 39.0–52.0)
Hemoglobin: 18 g/dL — ABNORMAL HIGH (ref 13.0–17.0)
Immature Granulocytes: 0 %
Lymphocytes Relative: 43 %
Lymphs Abs: 2.5 10*3/uL (ref 0.7–4.0)
MCH: 29.9 pg (ref 26.0–34.0)
MCHC: 34.5 g/dL (ref 30.0–36.0)
MCV: 86.7 fL (ref 80.0–100.0)
Monocytes Absolute: 0.4 10*3/uL (ref 0.1–1.0)
Monocytes Relative: 7 %
Neutro Abs: 2.7 10*3/uL (ref 1.7–7.7)
Neutrophils Relative %: 45 %
Platelets: 127 10*3/uL — ABNORMAL LOW (ref 150–400)
RBC: 6.02 MIL/uL — ABNORMAL HIGH (ref 4.22–5.81)
RDW: 13.7 % (ref 11.5–15.5)
WBC: 6 10*3/uL (ref 4.0–10.5)
nRBC: 0 % (ref 0.0–0.2)

## 2022-02-01 LAB — BASIC METABOLIC PANEL
Anion gap: 6 (ref 5–15)
BUN: 9 mg/dL (ref 8–23)
CO2: 31 mmol/L (ref 22–32)
Calcium: 9 mg/dL (ref 8.9–10.3)
Chloride: 104 mmol/L (ref 98–111)
Creatinine, Ser: 1.15 mg/dL (ref 0.61–1.24)
GFR, Estimated: >60 mL/min (ref >60)
Glucose, Bld: 93 mg/dL (ref 70–99)
Potassium: 4.3 mmol/L (ref 3.5–5.1)
Sodium: 141 mmol/L (ref 135–145)

## 2022-02-01 LAB — TROPONIN I (HIGH SENSITIVITY)
Troponin I (High Sensitivity): 20 ng/L — ABNORMAL HIGH (ref <18)
Troponin I (High Sensitivity): 23 ng/L — ABNORMAL HIGH (ref <18)

## 2022-02-01 LAB — RESP PANEL BY RT-PCR (RSV, FLU A&B, COVID)  RVPGX2
Influenza A by PCR: NEGATIVE
Influenza B by PCR: NEGATIVE
Resp Syncytial Virus by PCR: NEGATIVE
SARS Coronavirus 2 by RT PCR: POSITIVE — AB

## 2022-02-01 NOTE — ED Notes (Signed)
Pt leaving, IV removed.

## 2022-02-01 NOTE — ED Triage Notes (Signed)
The pt is alert and he has had flu-like symptoms for 10 days he has not seen his doctor he is c/o some chest tightness that he does not call pain

## 2022-02-01 NOTE — ED Triage Notes (Signed)
The pt arrived by gems from home pt c/o chest pain hx of af and he is in af ems gave 240m of nss on the way here  heart rate slowed

## 2022-02-01 NOTE — ED Provider Triage Note (Signed)
Emergency Medicine Provider Triage Evaluation Note  David Hendricks , a 72 y.o. male  was evaluated in triage.  Pt complains of increased coughing, shortness of breath, chest discomfort, and flulike symptoms over the last 10 days.  States 1 month ago stopped taking his medications due to lack of faith in the healthcare system.  Denies lower extremity swelling, pain, or redness.  Denies fevers, chills, back pain, lower extremity weakness.  Also with new headache onset, not sudden or worst of life, over the last few days.  Hx essential HTN, paroxysmal A-fib, prior CVA, GERD, COPD, prior MI, depression, anxiety.  Is supposed to be taking Eliquis, has not taken this again for 1 month.  Review of Systems  Positive:  Negative: See above  Physical Exam  BP 103/79 (BP Location: Right Arm)   Pulse (!) 102   Temp 98.7 F (37.1 C) (Oral)   Resp 18   Ht '6\' 1"'$  (1.854 m)   Wt 89.8 kg   SpO2 94%   BMI 26.12 kg/m  Gen:   Awake, no distress   Resp:  Normal effort  MSK:   Moves extremities without difficulty  Other:  Negative BEFAST.  Appears mildly anxious.  Chest nonTTP.  HR irregulary irregular, varies from 80-120 bpm.  Medical Decision Making  Medically screening exam initiated at 7:45 PM.  Appropriate orders placed.  David Hendricks was informed that the remainder of the evaluation will be completed by another provider, this initial triage assessment does not replace that evaluation, and the importance of remaining in the ED until their evaluation is complete.  Wells criteria 1.5, low risk.   Prince Rome, PA-C 58/59/29 2001

## 2022-02-11 ENCOUNTER — Emergency Department (HOSPITAL_COMMUNITY): Payer: Medicare Other

## 2022-02-11 ENCOUNTER — Other Ambulatory Visit: Payer: Self-pay

## 2022-02-11 ENCOUNTER — Encounter (HOSPITAL_COMMUNITY): Payer: Self-pay

## 2022-02-11 ENCOUNTER — Inpatient Hospital Stay (HOSPITAL_COMMUNITY)
Admission: EM | Admit: 2022-02-11 | Discharge: 2022-02-12 | DRG: 065 | Discharge status: Against Medical Advice | Payer: Medicare Other | Attending: Internal Medicine | Admitting: Internal Medicine

## 2022-02-11 DIAGNOSIS — I252 Old myocardial infarction: Secondary | ICD-10-CM

## 2022-02-11 DIAGNOSIS — F32A Depression, unspecified: Secondary | ICD-10-CM | POA: Diagnosis present

## 2022-02-11 DIAGNOSIS — I2510 Atherosclerotic heart disease of native coronary artery without angina pectoris: Secondary | ICD-10-CM | POA: Diagnosis present

## 2022-02-11 DIAGNOSIS — Z955 Presence of coronary angioplasty implant and graft: Secondary | ICD-10-CM

## 2022-02-11 DIAGNOSIS — N400 Enlarged prostate without lower urinary tract symptoms: Secondary | ICD-10-CM | POA: Diagnosis present

## 2022-02-11 DIAGNOSIS — Z91119 Patient's noncompliance with dietary regimen due to unspecified reason: Secondary | ICD-10-CM

## 2022-02-11 DIAGNOSIS — I4821 Permanent atrial fibrillation: Secondary | ICD-10-CM | POA: Diagnosis present

## 2022-02-11 DIAGNOSIS — R29700 NIHSS score 0: Secondary | ICD-10-CM | POA: Diagnosis present

## 2022-02-11 DIAGNOSIS — F5101 Primary insomnia: Secondary | ICD-10-CM

## 2022-02-11 DIAGNOSIS — R519 Headache, unspecified: Secondary | ICD-10-CM | POA: Diagnosis not present

## 2022-02-11 DIAGNOSIS — M47812 Spondylosis without myelopathy or radiculopathy, cervical region: Secondary | ICD-10-CM | POA: Diagnosis not present

## 2022-02-11 DIAGNOSIS — E039 Hypothyroidism, unspecified: Secondary | ICD-10-CM | POA: Diagnosis present

## 2022-02-11 DIAGNOSIS — F419 Anxiety disorder, unspecified: Secondary | ICD-10-CM | POA: Diagnosis present

## 2022-02-11 DIAGNOSIS — Z79899 Other long term (current) drug therapy: Secondary | ICD-10-CM

## 2022-02-11 DIAGNOSIS — Z91148 Patient's other noncompliance with medication regimen for other reason: Secondary | ICD-10-CM | POA: Diagnosis not present

## 2022-02-11 DIAGNOSIS — E538 Deficiency of other specified B group vitamins: Secondary | ICD-10-CM

## 2022-02-11 DIAGNOSIS — K219 Gastro-esophageal reflux disease without esophagitis: Secondary | ICD-10-CM | POA: Diagnosis present

## 2022-02-11 DIAGNOSIS — G8324 Monoplegia of upper limb affecting left nondominant side: Secondary | ICD-10-CM | POA: Diagnosis present

## 2022-02-11 DIAGNOSIS — R202 Paresthesia of skin: Secondary | ICD-10-CM | POA: Diagnosis not present

## 2022-02-11 DIAGNOSIS — Z811 Family history of alcohol abuse and dependence: Secondary | ICD-10-CM

## 2022-02-11 DIAGNOSIS — R3915 Urgency of urination: Secondary | ICD-10-CM | POA: Diagnosis present

## 2022-02-11 DIAGNOSIS — Z720 Tobacco use: Secondary | ICD-10-CM | POA: Diagnosis present

## 2022-02-11 DIAGNOSIS — M4802 Spinal stenosis, cervical region: Secondary | ICD-10-CM | POA: Diagnosis not present

## 2022-02-11 DIAGNOSIS — I480 Paroxysmal atrial fibrillation: Secondary | ICD-10-CM | POA: Diagnosis present

## 2022-02-11 DIAGNOSIS — R350 Frequency of micturition: Secondary | ICD-10-CM | POA: Diagnosis present

## 2022-02-11 DIAGNOSIS — I10 Essential (primary) hypertension: Secondary | ICD-10-CM | POA: Diagnosis present

## 2022-02-11 DIAGNOSIS — F17210 Nicotine dependence, cigarettes, uncomplicated: Secondary | ICD-10-CM | POA: Diagnosis present

## 2022-02-11 DIAGNOSIS — E0590 Thyrotoxicosis, unspecified without thyrotoxic crisis or storm: Secondary | ICD-10-CM | POA: Diagnosis present

## 2022-02-11 DIAGNOSIS — I4891 Unspecified atrial fibrillation: Secondary | ICD-10-CM | POA: Diagnosis not present

## 2022-02-11 DIAGNOSIS — Z7982 Long term (current) use of aspirin: Secondary | ICD-10-CM

## 2022-02-11 DIAGNOSIS — I6340 Cerebral infarction due to embolism of unspecified cerebral artery: Secondary | ICD-10-CM | POA: Diagnosis not present

## 2022-02-11 DIAGNOSIS — J449 Chronic obstructive pulmonary disease, unspecified: Secondary | ICD-10-CM | POA: Diagnosis present

## 2022-02-11 DIAGNOSIS — I6389 Other cerebral infarction: Secondary | ICD-10-CM | POA: Diagnosis not present

## 2022-02-11 DIAGNOSIS — I639 Cerebral infarction, unspecified: Secondary | ICD-10-CM | POA: Diagnosis present

## 2022-02-11 DIAGNOSIS — E785 Hyperlipidemia, unspecified: Secondary | ICD-10-CM | POA: Diagnosis present

## 2022-02-11 DIAGNOSIS — N401 Enlarged prostate with lower urinary tract symptoms: Secondary | ICD-10-CM | POA: Diagnosis present

## 2022-02-11 DIAGNOSIS — E049 Nontoxic goiter, unspecified: Secondary | ICD-10-CM

## 2022-02-11 DIAGNOSIS — M50222 Other cervical disc displacement at C5-C6 level: Secondary | ICD-10-CM | POA: Diagnosis not present

## 2022-02-11 DIAGNOSIS — Z808 Family history of malignant neoplasm of other organs or systems: Secondary | ICD-10-CM

## 2022-02-11 DIAGNOSIS — R531 Weakness: Secondary | ICD-10-CM | POA: Diagnosis not present

## 2022-02-11 DIAGNOSIS — Z95828 Presence of other vascular implants and grafts: Secondary | ICD-10-CM

## 2022-02-11 DIAGNOSIS — E042 Nontoxic multinodular goiter: Secondary | ICD-10-CM | POA: Diagnosis present

## 2022-02-11 DIAGNOSIS — Z8673 Personal history of transient ischemic attack (TIA), and cerebral infarction without residual deficits: Secondary | ICD-10-CM

## 2022-02-11 DIAGNOSIS — G319 Degenerative disease of nervous system, unspecified: Secondary | ICD-10-CM | POA: Diagnosis not present

## 2022-02-11 DIAGNOSIS — Z7901 Long term (current) use of anticoagulants: Secondary | ICD-10-CM

## 2022-02-11 DIAGNOSIS — E213 Hyperparathyroidism, unspecified: Secondary | ICD-10-CM | POA: Diagnosis present

## 2022-02-11 LAB — CBC
HCT: 51.9 % (ref 39.0–52.0)
Hemoglobin: 17.1 g/dL — ABNORMAL HIGH (ref 13.0–17.0)
MCH: 28.4 pg (ref 26.0–34.0)
MCHC: 32.9 g/dL (ref 30.0–36.0)
MCV: 86.1 fL (ref 80.0–100.0)
Platelets: 127 10*3/uL — ABNORMAL LOW (ref 150–400)
RBC: 6.03 MIL/uL — ABNORMAL HIGH (ref 4.22–5.81)
RDW: 13.7 % (ref 11.5–15.5)
WBC: 6.9 10*3/uL (ref 4.0–10.5)
nRBC: 0 % (ref 0.0–0.2)

## 2022-02-11 LAB — PROTIME-INR
INR: 1 (ref 0.8–1.2)
Prothrombin Time: 13.4 seconds (ref 11.4–15.2)

## 2022-02-11 LAB — COMPREHENSIVE METABOLIC PANEL
ALT: 13 U/L (ref 0–44)
AST: 18 U/L (ref 15–41)
Albumin: 3.5 g/dL (ref 3.5–5.0)
Alkaline Phosphatase: 84 U/L (ref 38–126)
Anion gap: 8 (ref 5–15)
BUN: 11 mg/dL (ref 8–23)
CO2: 27 mmol/L (ref 22–32)
Calcium: 8.5 mg/dL — ABNORMAL LOW (ref 8.9–10.3)
Chloride: 104 mmol/L (ref 98–111)
Creatinine, Ser: 1.05 mg/dL (ref 0.61–1.24)
GFR, Estimated: >60 mL/min (ref >60)
Glucose, Bld: 98 mg/dL (ref 70–99)
Potassium: 4.2 mmol/L (ref 3.5–5.1)
Sodium: 139 mmol/L (ref 135–145)
Total Bilirubin: 1.4 mg/dL — ABNORMAL HIGH (ref 0.3–1.2)
Total Protein: 5.9 g/dL — ABNORMAL LOW (ref 6.5–8.1)

## 2022-02-11 LAB — DIFFERENTIAL
Abs Immature Granulocytes: 0.02 10*3/uL (ref 0.00–0.07)
Basophils Absolute: 0.1 10*3/uL (ref 0.0–0.1)
Basophils Relative: 1 %
Eosinophils Absolute: 0.1 10*3/uL (ref 0.0–0.5)
Eosinophils Relative: 2 %
Immature Granulocytes: 0 %
Lymphocytes Relative: 32 %
Lymphs Abs: 2.2 10*3/uL (ref 0.7–4.0)
Monocytes Absolute: 0.4 10*3/uL (ref 0.1–1.0)
Monocytes Relative: 6 %
Neutro Abs: 4.1 10*3/uL (ref 1.7–7.7)
Neutrophils Relative %: 59 %

## 2022-02-11 LAB — URINALYSIS, ROUTINE W REFLEX MICROSCOPIC
Bilirubin Urine: NEGATIVE
Glucose, UA: NEGATIVE mg/dL
Hgb urine dipstick: NEGATIVE
Ketones, ur: NEGATIVE mg/dL
Leukocytes,Ua: NEGATIVE
Nitrite: NEGATIVE
Protein, ur: NEGATIVE mg/dL
Specific Gravity, Urine: 1.008 (ref 1.005–1.030)
pH: 5 (ref 5.0–8.0)

## 2022-02-11 LAB — LIPID PANEL
Cholesterol: 156 mg/dL (ref 0–200)
HDL: 36 mg/dL — ABNORMAL LOW (ref >40)
LDL Cholesterol: 99 mg/dL (ref 0–99)
Total CHOL/HDL Ratio: 4.3 RATIO
Triglycerides: 104 mg/dL (ref <150)
VLDL: 21 mg/dL (ref 0–40)

## 2022-02-11 LAB — RAPID URINE DRUG SCREEN, HOSP PERFORMED
Amphetamines: NOT DETECTED
Barbiturates: NOT DETECTED
Benzodiazepines: NOT DETECTED
Cocaine: NOT DETECTED
Opiates: NOT DETECTED
Tetrahydrocannabinol: NOT DETECTED

## 2022-02-11 LAB — APTT: aPTT: 28 seconds (ref 24–36)

## 2022-02-11 LAB — ETHANOL: Alcohol, Ethyl (B): <10 mg/dL (ref <10)

## 2022-02-11 MED ORDER — OXYMETAZOLINE HCL 0.05 % NA SOLN
1.0000 | Freq: Once | NASAL | Status: AC
  Administered 2022-02-11: 1 via NASAL
  Filled 2022-02-11: qty 30

## 2022-02-11 MED ORDER — CITALOPRAM HYDROBROMIDE 10 MG PO TABS
20.0000 mg | ORAL_TABLET | Freq: Every day | ORAL | Status: DC
  Administered 2022-02-12: 20 mg via ORAL
  Filled 2022-02-11: qty 2

## 2022-02-11 MED ORDER — ASPIRIN 325 MG PO TABS
325.0000 mg | ORAL_TABLET | Freq: Every day | ORAL | Status: DC
  Administered 2022-02-11 – 2022-02-12 (×2): 325 mg via ORAL
  Filled 2022-02-11 (×2): qty 1

## 2022-02-11 MED ORDER — SODIUM CHLORIDE 0.9 % IV SOLN
100.0000 mL/h | INTRAVENOUS | Status: DC
  Administered 2022-02-11: 100 mL/h via INTRAVENOUS

## 2022-02-11 MED ORDER — ACETAMINOPHEN 160 MG/5ML PO SOLN: 650.0000 mg | ORAL | Status: DC | PRN

## 2022-02-11 MED ORDER — STROKE: EARLY STAGES OF RECOVERY BOOK
Freq: Once | Status: AC
  Administered 2022-02-12: 1
  Filled 2022-02-11: qty 1

## 2022-02-11 MED ORDER — NICOTINE 21 MG/24HR TD PT24
21.0000 mg | MEDICATED_PATCH | Freq: Once | TRANSDERMAL | Status: AC
  Administered 2022-02-11: 21 mg via TRANSDERMAL
  Filled 2022-02-11: qty 1

## 2022-02-11 MED ORDER — SENNOSIDES-DOCUSATE SODIUM 8.6-50 MG PO TABS: 1.0000 | ORAL_TABLET | Freq: Every evening | ORAL | Status: DC | PRN

## 2022-02-11 MED ORDER — LORAZEPAM 2 MG/ML IJ SOLN: 0.5000 mg | Freq: Once | INTRAMUSCULAR | Status: DC | PRN

## 2022-02-11 MED ORDER — ACETAMINOPHEN 325 MG PO TABS: 650.0000 mg | ORAL_TABLET | ORAL | Status: DC | PRN

## 2022-02-11 MED ORDER — IOHEXOL 350 MG/ML SOLN
75.0000 mL | Freq: Once | INTRAVENOUS | Status: AC | PRN
  Administered 2022-02-11: 75 mL via INTRAVENOUS

## 2022-02-11 MED ORDER — ACETAMINOPHEN 650 MG RE SUPP: 650.0000 mg | RECTAL | Status: DC | PRN

## 2022-02-11 MED ORDER — APIXABAN 5 MG PO TABS
5.0000 mg | ORAL_TABLET | Freq: Two times a day (BID) | ORAL | Status: DC
  Administered 2022-02-12: 5 mg via ORAL
  Filled 2022-02-11: qty 1

## 2022-02-11 MED ORDER — ATORVASTATIN CALCIUM 80 MG PO TABS
80.0000 mg | ORAL_TABLET | Freq: Every day | ORAL | Status: DC
  Administered 2022-02-12: 80 mg via ORAL
  Filled 2022-02-11: qty 1

## 2022-02-11 MED ORDER — TRAZODONE HCL 100 MG PO TABS: 100.0000 mg | ORAL_TABLET | Freq: Every day | ORAL | Status: DC

## 2022-02-11 MED ORDER — PANTOPRAZOLE SODIUM 20 MG PO TBEC
20.0000 mg | DELAYED_RELEASE_TABLET | Freq: Every day | ORAL | Status: DC
  Administered 2022-02-12: 20 mg via ORAL
  Filled 2022-02-11: qty 1

## 2022-02-11 MED ORDER — SODIUM CHLORIDE 0.9 % IV BOLUS
500.0000 mL | Freq: Once | INTRAVENOUS | Status: AC
Start: 2022-02-11 — End: 2022-02-11
  Administered 2022-02-11: 500 mL via INTRAVENOUS

## 2022-02-11 MED ORDER — NICOTINE 21 MG/24HR TD PT24
21.0000 mg | MEDICATED_PATCH | Freq: Every day | TRANSDERMAL | Status: DC
  Administered 2022-02-12: 21 mg via TRANSDERMAL
  Filled 2022-02-11: qty 1

## 2022-02-11 NOTE — ED Notes (Signed)
Report called to [facility].

## 2022-02-11 NOTE — ED Provider Notes (Signed)
[place] EMERGENCY DEPARTMENT AT [facility] Provider Note   CSN: [id number] Arrival date & time: 02/11/22  1332     History  Chief Complaint  Patient presents with   Arm Pain   Shoulder Pain    David Hendricks is a 72 y.o. male.  Pt is a 72 yo male with pmhx significant for CVA, CAD, BPH, Depression and anxiety, HLD, COPD, and tobacco abuse.  Pt said he went to sleep last night around 2200 and was fine.  He woke up this am around 1030 and could not use his left arm.  When he had his stroke 2 years ago, he had the same sx.  He had to go to therapy and has regained use of this arm and has been fine.  Pt denies any other neuro sx.         Home Medications Prior to Admission medications   Medication Sig Start Date End Date Taking? Authorizing Provider  acetaminophen (TYLENOL) 500 MG tablet Take 1,000 mg by mouth every 6 (six) hours as needed for mild pain.   Yes [provider]  aspirin EC 81 MG tablet Take 1 tablet (81 mg total) by mouth daily. Swallow whole. 04/27/20  Yes Vickie Epley, MD  citalopram (CELEXA) 20 MG tablet Take 20 mg by mouth daily.   Yes [provider]  traZODone (DESYREL) 100 MG tablet TAKE 1 TABLET BY MOUTH AT BEDTIME Patient taking differently: Take 100 mg by mouth at bedtime as needed for sleep. 11/02/21  Yes Wardell Honour, MD  apixaban (ELIQUIS) 5 MG TABS tablet Take 1 tablet (5 mg total) by mouth 2 (two) times daily. Patient not taking: Reported on 02/11/2022 02/15/21   Vickie Epley, MD  atorvastatin (LIPITOR) 80 MG tablet Take 1 tablet (80 mg total) by mouth daily. Patient not taking: Reported on 02/11/2022 03/08/20   Angiulli, Lavon Paganini, PA-C  Baclofen 5 MG TABS Take 5 mg by mouth 4 (four) times daily. Patient not taking: Reported on 02/11/2022 03/31/20   Courtney Heys, MD  Blood Pressure Monitoring (COMFORT TOUCH BP CUFF/LARGE) MISC 1 Device by Other route 2 (two) times daily. Use to check Blood Pressure twice daily. Dx: I10  02/22/21   Wardell Honour, MD  gabapentin (NEURONTIN) 300 MG capsule Take 1 capsule (300 mg total) by mouth 2 (two) times daily. Patient not taking: Reported on 02/11/2022 02/15/21   Wardell Honour, MD  HYDROcodone-acetaminophen (NORCO/VICODIN) 5-325 MG tablet Take 2 tablets by mouth every 4 (four) hours as needed. Patient not taking: Reported on 02/11/2022 07/04/21   Kommor, Debe Coder, MD  melatonin 3 MG TABS tablet Take 1 tablet (3 mg total) by mouth at bedtime. Patient not taking: Reported on 02/11/2022 03/08/20   Angiulli, Lavon Paganini, PA-C  methimazole (TAPAZOLE) 5 MG tablet TAKE 1 TABLET(5 MG) BY MOUTH DAILY Patient not taking: Reported on 02/11/2022 03/08/20   Angiulli, Lavon Paganini, PA-C  methylPREDNISolone (MEDROL DOSEPAK) 4 MG TBPK tablet Take as prescribed Patient not taking: Reported on 02/11/2022 07/04/21   Kommor, Debe Coder, MD  metoCLOPramide (REGLAN) 5 MG tablet Take 1 tablet (5 mg total) by mouth 2 (two) times daily as needed for nausea. 01/20/21   Fargo, Amy E, NP  metoprolol succinate (TOPROL XL) 25 MG 24 hr tablet Take 1 tablet (25 mg total) by mouth daily. Please make yearly appt with Dr. Quentin Ore for April 2023 for future refills. Thank you 1st attempt Patient not taking: Reported on 02/11/2022 02/15/21  Vickie Epley, MD  tamsulosin (FLOMAX) 0.4 MG CAPS capsule Take 2 capsules (0.8 mg total) by mouth daily after supper. Patient not taking: Reported on 02/11/2022 01/20/21   Yvonna Alanis, NP      Allergies    Patient has no known allergies.    Review of Systems   Review of Systems  Neurological:  Positive for weakness.  All other systems reviewed and are negative.   Physical Exam Updated Vital Signs BP (!) 125/100   Pulse 71   Temp 97.7 F (36.5 C) (Oral)   Resp 16   SpO2 96%  Physical Exam Vitals and nursing note reviewed.  Constitutional:      Appearance: Normal appearance.  HENT:     Head: Normocephalic and atraumatic.     Right Ear: External ear normal.     Left Ear:  External ear normal.     Nose: Nose normal.     Mouth/Throat:     Mouth: Mucous membranes are moist.     Pharynx: Oropharynx is clear.  Eyes:     Extraocular Movements: Extraocular movements intact.     Conjunctiva/sclera: Conjunctivae normal.     Pupils: Pupils are equal, round, and reactive to light.  Cardiovascular:     Rate and Rhythm: Normal rate and regular rhythm.     Pulses: Normal pulses.     Heart sounds: Normal heart sounds.  Pulmonary:     Effort: Pulmonary effort is normal.     Breath sounds: Normal breath sounds.  Abdominal:     General: Abdomen is flat. Bowel sounds are normal.     Palpations: Abdomen is soft.  Musculoskeletal:        General: Normal range of motion.     Cervical back: Normal range of motion and neck supple.  Skin:    General: Skin is warm.     Capillary Refill: Capillary refill takes less than 2 seconds.  Neurological:     Mental Status: He is alert and oriented to person, place, and time.     Comments: Left arm weakness; radial nerve palsy  Psychiatric:        Mood and Affect: Mood normal.        Behavior: Behavior normal.     ED Results / Procedures / Treatments   Labs (all labs ordered are listed, but only abnormal results are displayed) Labs Reviewed  CBC - Abnormal; Notable for the following components:      Result Value   RBC 6.03 (*)    Hemoglobin 17.1 (*)    Platelets 127 (*)    All other components within normal limits  COMPREHENSIVE METABOLIC PANEL - Abnormal; Notable for the following components:   Calcium 8.5 (*)    Total Protein 5.9 (*)    Total Bilirubin 1.4 (*)    All other components within normal limits  LIPID PANEL - Abnormal; Notable for the following components:   HDL 36 (*)    All other components within normal limits  ETHANOL  PROTIME-INR  APTT  DIFFERENTIAL  RAPID URINE DRUG SCREEN, HOSP PERFORMED  URINALYSIS, ROUTINE W REFLEX MICROSCOPIC  HEMOGLOBIN A1C    EKG EKG  Interpretation  Date/Time:  Saturday February 11 2022 18:58:41 EST Ventricular Rate:  93 PR Interval:    QRS Duration: 121 QT Interval:  384 QTC Calculation: 478 R Axis:   258 Text Interpretation: Atrial fibrillation Nonspecific IVCD with LAD Borderline T abnormalities, inferior leads No significant change since last tracing Confirmed  by Isla Pence [phone]) on 02/11/2022 7:19:42 PM  Radiology CT ANGIO HEAD NECK W WO CM  Result Date: 02/11/2022 CLINICAL DATA:  Left hand tingling and weakness EXAM: CT ANGIOGRAPHY HEAD AND NECK TECHNIQUE: Multidetector CT imaging of the head and neck was performed using the standard protocol during bolus administration of intravenous contrast. Multiplanar CT image reconstructions and MIPs were obtained to evaluate the vascular anatomy. Carotid stenosis measurements (when applicable) are obtained utilizing NASCET criteria, using the distal internal carotid diameter as the denominator. RADIATION DOSE REDUCTION: This exam was performed according to the departmental dose-optimization program which includes automated exposure control, adjustment of the mA and/or kV according to patient size and/or use of iterative reconstruction technique. CONTRAST:  82m OMNIPAQUE IOHEXOL 350 MG/ML SOLN COMPARISON:  None Available. FINDINGS: CTA NECK FINDINGS SKELETON: There is no bony spinal canal stenosis. No lytic or blastic lesion. OTHER NECK: There is a large multinodular goiter, left asymmetric with a large component extending into the upper mediastinum. UPPER CHEST: Biapical emphysema. AORTIC ARCH: There is calcific atherosclerosis of the aortic arch. There is no aneurysm, dissection or hemodynamically significant stenosis of the visualized portion of the aorta. Conventional 3 vessel aortic branching pattern. The visualized proximal subclavian arteries are widely patent. RIGHT CAROTID SYSTEM: Normal without aneurysm, dissection or stenosis. LEFT CAROTID SYSTEM: Normal without  aneurysm, dissection or stenosis. VERTEBRAL ARTERIES: Left dominant configuration. Both origins are clearly patent. There is no dissection, occlusion or flow-limiting stenosis to the skull base (V1-V3 segments). CTA HEAD FINDINGS POSTERIOR CIRCULATION: --Vertebral arteries: Normal V4 segments. --Inferior cerebellar arteries: Normal. --Basilar artery: Normal. --Superior cerebellar arteries: Normal. --Posterior cerebral arteries (PCA): Normal. ANTERIOR CIRCULATION: --Intracranial internal carotid arteries: Normal. --Anterior cerebral arteries (ACA): Normal. Both A1 segments are present. Patent anterior communicating artery (a-comm). --Middle cerebral arteries (MCA): Normal. VENOUS SINUSES: As permitted by contrast timing, patent. ANATOMIC VARIANTS: Fetal origins of both posterior cerebral arteries. Review of the MIP images confirms the above findings. IMPRESSION: 1. No emergent large vessel occlusion or high-grade stenosis of the intracranial arteries. 2. Incidental heterogeneous and enlarged thyroid. Recommend non-emergent thyroid ultrasound. Reference: J Am Coll Radiol. 2015 Feb;12(2): 143-50 Aortic Atherosclerosis (ICD10-I70.0) and Emphysema (ICD10-J43.9). Electronically Signed   By: KUlyses JarredM.D.   On: 02/11/2022 20:59   MR BRAIN WO CONTRAST  Result Date: 02/11/2022 CLINICAL DATA:  Stroke suspected EXAM: MRI HEAD WITHOUT CONTRAST MRI CERVICAL SPINE WITHOUT CONTRAST TECHNIQUE: Multiplanar, multiecho pulse sequences of the brain and surrounding structures, and cervical spine, to include the craniocervical junction and cervicothoracic junction, were obtained without intravenous contrast. COMPARISON:  MRI Brain 08/19/20 FINDINGS: MRI HEAD FINDINGS Brain: Small cortical infarcts in the posterior frontal lobes bilaterally (series 5, image 49, 46). Chronic infarct in the anterior left frontal lobe with possible cortical laminar necrosis. Sequela of severe chronic microvascular ischemic change. Redemonstrated  chronic cerebellar infarctsr bilateraally. no evidence of a parenchymal hematoma. No hydrocephalus. Advanced generalized volume loss. Vascular: Normal flow voids. Skull and upper cervical spine: Normal marrow signal. Sinuses/Orbits: Bilateral lens replacement. No mastoid or middle ear effusion. Paranasal sinuses are generally clear. Other: None. MRI CERVICAL SPINE FINDINGS Alignment: Physiologic. Vertebrae: No fracture, evidence of discitis, or bone lesion. Cord: Normal signal and morphology. Posterior Fossa, vertebral arteries, paraspinal tissues: There is a multiple multinodular thyroid goiter on the left with likely substernal extension. Disc levels: C1-C2: Mild degenerative change. C2-C3: No significant disc bulge. Moderate left and mild right facet degenerative change. Moderate left neural foraminal narrowing. No spinal canal narrowing. C3-C4:  Moderate bilateral facet degenerative change. No significant disc bulge. Ligamentum flavum hypertrophy. Mild spinal canal narrowing. Moderate bilateral neural foraminal narrowing. C4-C5: No significant disc bulge. Moderate to severe bilateral facet degenerative change. Ligamentum flavum hypertrophy. Mild spinal canal narrowing. Moderate bilateral neural foraminal narrowing C5-C6: Moderate bilateral facet degenerative change. Minimal disc bulge. Mild spinal canal narrowing. Mild-to-moderate bilateral neural foraminal narrowing. C6-C7: Mild bilateral facet degenerative change. No significant spinal canal stenosis. No significant disc bulge. No neural foraminal stenosis. C7-T1: Unremarkable. IMPRESSION: 1. Small cortical infarcts in the posterior frontal lobes bilaterally. 2. No acute cervical spine fracture or malalignment. 3. Multilevel neural foraminal narrowing, with moderate neural foraminal narrowing on the left at C2-C3 and bilaterally at C3-C4 and C4-C5. 4. Mild spinal canal narrowing at C3-C4, C4-C5, and C5-C6. 5. 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 These results will be called to the ordering clinician or representative by the Radiologist Assistant, and communication documented in the PACS or [employer]. Electronically Signed   By: Marin Roberts M.D.   On: 02/11/2022 19:02   MR Cervical Spine Wo Contrast  Result Date: 02/11/2022 CLINICAL DATA:  Stroke suspected EXAM: MRI HEAD WITHOUT CONTRAST MRI CERVICAL SPINE WITHOUT CONTRAST TECHNIQUE: Multiplanar, multiecho pulse sequences of the brain and surrounding structures, and cervical spine, to include the craniocervical junction and cervicothoracic junction, were obtained without intravenous contrast. COMPARISON:  MRI Brain 08/19/20 FINDINGS: MRI HEAD FINDINGS Brain: Small cortical infarcts in the posterior frontal lobes bilaterally (series 5, image 49, 46). Chronic infarct in the anterior left frontal lobe with possible cortical laminar necrosis. Sequela of severe chronic microvascular ischemic change. Redemonstrated chronic cerebellar infarctsr bilateraally. no evidence of a parenchymal hematoma. No hydrocephalus. Advanced generalized volume loss. Vascular: Normal flow voids. Skull and upper cervical spine: Normal marrow signal. Sinuses/Orbits: Bilateral lens replacement. No mastoid or middle ear effusion. Paranasal sinuses are generally clear. Other: None. MRI CERVICAL SPINE FINDINGS Alignment: Physiologic. Vertebrae: No fracture, evidence of discitis, or bone lesion. Cord: Normal signal and morphology. Posterior Fossa, vertebral arteries, paraspinal tissues: There is a multiple multinodular thyroid goiter on the left with likely substernal extension. Disc levels: C1-C2: Mild degenerative change. C2-C3: No significant disc bulge. Moderate left and mild right facet degenerative change. Moderate left neural foraminal narrowing. No spinal canal narrowing. C3-C4: Moderate bilateral facet degenerative change. No significant disc bulge. Ligamentum flavum hypertrophy. Mild spinal canal  narrowing. Moderate bilateral neural foraminal narrowing. C4-C5: No significant disc bulge. Moderate to severe bilateral facet degenerative change. Ligamentum flavum hypertrophy. Mild spinal canal narrowing. Moderate bilateral neural foraminal narrowing C5-C6: Moderate bilateral facet degenerative change. Minimal disc bulge. Mild spinal canal narrowing. Mild-to-moderate bilateral neural foraminal narrowing. C6-C7: Mild bilateral facet degenerative change. No significant spinal canal stenosis. No significant disc bulge. No neural foraminal stenosis. C7-T1: Unremarkable. IMPRESSION: 1. Small cortical infarcts in the posterior frontal lobes bilaterally. 2. No acute cervical spine fracture or malalignment. 3. Multilevel neural foraminal narrowing, with moderate neural foraminal narrowing on the left at C2-C3 and bilaterally at C3-C4 and C4-C5. 4. Mild spinal canal narrowing at C3-C4, C4-C5, and C5-C6. 5. 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 These results will be called to the ordering clinician or representative by the Radiologist Assistant, and communication documented in the PACS or [employer]. Electronically Signed   By: Marin Roberts M.D.   On: 02/11/2022 19:02   CT HEAD WO CONTRAST  Result Date: 02/11/2022 CLINICAL DATA:  Arm and shoulder pain, neurologic deficit EXAM:  CT HEAD WITHOUT CONTRAST TECHNIQUE: Contiguous axial images were obtained from the base of the skull through the vertex without intravenous contrast. RADIATION DOSE REDUCTION: This exam was performed according to the departmental dose-optimization program which includes automated exposure control, adjustment of the mA and/or kV according to patient size and/or use of iterative reconstruction technique. COMPARISON:  02/01/2022 FINDINGS: Brain: Stable chronic small-vessel ischemic changes throughout the periventricular white matter and bilateral basal ganglia. No evidence of acute infarct or hemorrhage.  Lateral ventricles and remaining midline structures are stable. No acute extra-axial fluid collections. No mass effect. Vascular: No hyperdense vessel or unexpected calcification. Skull: Normal. Negative for fracture or focal lesion. Sinuses/Orbits: No acute finding. Other: None. IMPRESSION: 1. Stable head CT, no acute intracranial process. Electronically Signed   By: Randa Ngo M.D.   On: 02/11/2022 15:01    Procedures Procedures    Medications Ordered in ED Medications  sodium chloride 0.9 % bolus 500 mL (0 mLs Intravenous Stopped 02/11/22 1605)    Followed by  0.9 %  sodium chloride infusion (100 mL/hr Intravenous New Bag/Given 02/11/22 1502)  LORazepam (ATIVAN) injection 0.5 mg (has no administration in time range)  aspirin tablet 325 mg (325 mg Oral Given 02/11/22 2017)  nicotine (NICODERM CQ - dosed in mg/24 hours) patch 21 mg (21 mg Transdermal Patch Applied 02/11/22 2017)  oxymetazoline (AFRIN) 0.05 % nasal spray 1 spray (1 spray Each Nare Given 02/11/22 2017)  iohexol (OMNIPAQUE) 350 MG/ML injection 75 mL (75 mLs Intravenous Contrast Given 02/11/22 2038)    ED Course/ Medical Decision Making/ A&P                             Medical Decision Making Amount and/or Complexity of Data Reviewed Labs: ordered. Radiology: ordered.  Risk OTC drugs. Prescription drug management. Decision regarding hospitalization.   This patient presents to the ED for concern of cva, this involves an extensive number of treatment options, and is a complaint that carries with it a high risk of complications and morbidity.  The differential diagnosis includes cva, tia, radial n. palsy   Co morbidities that complicate the patient evaluation  CVA, CAD, BPH, Depression and anxiety, HLD, COPD, and tobacco abuse   Additional history obtained:  Additional history obtained from epic chart review External records from outside source obtained and reviewed including EMS report   Lab Tests:  I Ordered, and  personally interpreted labs.  The pertinent results include:  cbc nl, cmp nl, inr 1.0; etoh <10; ua neg; uds neg   Imaging Studies ordered:  I ordered imaging studies including ct head and MRI I independently visualized and interpreted imaging which showed  CT head: Stable head CT, no acute intracranial process.  MRI brain and MRI cervical spine: 1. Small cortical infarcts in the posterior frontal lobes  bilaterally.  2. No acute cervical spine fracture or malalignment.  3. Multilevel neural foraminal narrowing, with moderate neural  foraminal narrowing on the left at C2-C3 and bilaterally at C3-C4  and C4-C5.  4. Mild spinal canal narrowing at C3-C4, C4-C5, and C5-C6.  5. Multinodular thyroid goiter on the left with likely substernal  extension.  CTA head/neck: . No emergent large vessel occlusion or high-grade stenosis of the  intracranial arteries.  2. Incidental heterogeneous and enlarged thyroid. Recommend  non-emergent thyroid ultrasound.   I agree with the radiologist interpretation   Cardiac Monitoring:  The patient was maintained on a cardiac monitor.  I personally viewed and interpreted the cardiac monitored which showed an underlying rhythm of: nsr   Medicines ordered and prescription drug management:  I ordered medication including IVFs  for dehydration  Reevaluation of the patient after these medicines showed that the patient improved I have reviewed the patients home medicines and have made adjustments as needed   Test Considered:  mri   Critical Interventions:  Neuro consult   Consultations Obtained:  I requested consultation with the neurologist (Dr. Curly Shores),  and discussed lab and imaging findings as well as pertinent plan -she recommends admission for PT/OT eval.  Restart Eliquis tomorrow.  CTA head/neck tonight. Give ASA tonight.  Admit to [facility]. Pt d/w hospitalist (Dr. Patrecia Pour) who will admit.  Problem List / ED Course:  CVA:  likely  due to medication noncompliance + heavy tobacco abuse.  Pt will be admitted for PT/OT. Medication noncompliance:  pt said he's lost faith in the medical system.  Hopefully, he will regain faith and re-start his meds. Enlarged thyroid:  pt will need a non-emergent thyroid US.   Reevaluation:  After the interventions noted above, I reevaluated the patient and found that they have :stayed the same   Social Determinants of Health:  Lives alone   Dispostion:  After consideration of the diagnostic results and the patients response to treatment, I feel that the patent would benefit from admission.          Final Clinical Impression(s) / ED Diagnoses Final diagnoses:  Cerebrovascular accident (CVA), unspecified mechanism ([place])  Permanent atrial fibrillation ([place])  Noncompliance with diet and medication regimen  Enlarged thyroid  Tobacco abuse    Rx / DC Orders ED Discharge Orders     None         Isla Pence, MD 02/11/22 2104

## 2022-02-11 NOTE — Consult Note (Incomplete)
Neurology Consultation Reason for Consult: Strokes on MRI Requesting Physician: ***  CC: ***  History is obtained from:***  HPI: David Hendricks is a 72 y.o. male ***   LKW: *** Thrombolytic given?: No, or if yes, time given ***  Checklist of contraindications was reviewed and negative. Risks, benefits and alternatives were discussed *** IA performed?: No, or if yes, groin puncture time: *** Premorbid modified rankin scale: ***     0 - No symptoms.     1 - No significant disability. Able to carry out all usual activities, despite some symptoms.     2 - Slight disability. Able to look after own affairs without assistance, but unable to carry out all previous activities.     3 - Moderate disability. Requires some help, but able to walk unassisted.     4 - Moderately severe disability. Unable to attend to own bodily needs without assistance, and unable to walk unassisted.     5 - Severe disability. Requires constant nursing care and attention, bedridden, incontinent.     6 - Dead. ICH Score: ***  Time performed: *** GCS: {Blank single:19197::"3-4 is 2 points","5-12 is 1 point","13-15 is 0 points"} Infratentorial: {yes no:314532}. If yes, 1 point Volume: {Blank single:19197::">30cc is 1 point","<30cc is 0 points"}  Age: 72 y.o.. >80 is 1 point Intraventricular extension is 1 point  Score:***  A Score of {Blank single:19197::"0 points has a 30 day mortality of 0%","1 points has a 30 day mortality of 13%","2 points has a 30 day mortality of 26%","3 points has a 30 day mortality of 72%","4 points has a 30 day mortality of 97%","5-6 points has a 30 day mortality of 100%"}. Stroke. 2001 Apr;32(4):891-7.    ROS: All other review of systems was negative except as noted in the HPI. *** Unable to obtain due to altered mental status.   Past Medical History:  Diagnosis Date   Aortic atherosclerosis (HCC)    Arthritis    Benign prostatic hyperplasia (BPH) with urinary urgency    COPD (chronic  obstructive pulmonary disease) ([place])    Coronary artery disease    10/18 PCI/DES to pLAD, mild nonobstructive disease in the Lcx/RCA. Normal EF.    Depression with anxiety    Dyslipidemia    Dysrhythmia 2016   irregular heartbeat   Gallstones    GERD (gastroesophageal reflux disease)    History of kidney stones    Hyperthyroidism    Leg cramps    Multiple pulmonary nodules    Myocardial infarction ([place])    2018   Pneumonia    Seasonal allergies    Stroke ([place])    Vitamin B 12 deficiency    ***  Family History  Problem Relation Age of Onset   Alcohol abuse Father        called [facility] per pt   Cancer Father        [facility] PER PT NON SURGICAL    Colon cancer Neg Hx    Colon polyps Neg Hx    Esophageal cancer Neg Hx    Rectal cancer Neg Hx    Stomach cancer Neg Hx    ***  Social History:  reports that he has been smoking cigarettes. He started smoking about 61 years ago. He has a 90.00 pack-year smoking history. He has never used smokeless tobacco. He reports that he does not currently use alcohol. He reports that he does not use drugs. ***  Exam: Current vital signs: BP Marland Kitchen)  125/100   Pulse 71   Temp 97.7 F (36.5 C) (Oral)   Resp 16   SpO2 96%  Vital signs in last 24 hours: Temp:  [97.7 F (36.5 C)] 97.7 F (36.5 C) (02/03 1730) Pulse Rate:  [63-99] 71 (02/03 1830) Resp:  [16-18] 16 (02/03 1830) BP: (91-125)/(64-100) 125/100 (02/03 1830) SpO2:  [90 %-98 %] 96 % (02/03 1830)   Physical Exam  Constitutional: Appears well-developed and well-nourished.  Psych: Affect appropriate to situation, *** Eyes: No scleral injection HENT: No oropharyngeal obstruction.  MSK: no joint deformities.  Cardiovascular: Normal rate and regular rhythm. *** Perfusing extremities well Respiratory: Effort normal, non-labored breathing GI: Soft.  No distension. There is no tenderness.  Skin: Warm dry and intact visible skin  Neuro: Mental Status: Patient is  awake, alert, oriented to person, place, month, year, and situation.*** Patient is able to give a clear and coherent history.*** No signs of aphasia or neglect*** Cranial Nerves: II: Visual Fields are full. Pupils are equal, round, and reactive to light.  *** III,IV, VI: EOMI without ptosis or diploplia.  V: Facial sensation is symmetric to temperature VII: Facial movement is symmetric.  VIII: hearing is intact to voice X: Uvula elevates symmetrically XI: Shoulder shrug is symmetric. XII: tongue is midline without atrophy or fasciculations.  Motor: Tone is normal. Bulk is normal. 5/5 strength was present in all four extremities. *** Sensory: Sensation is symmetric to light touch and temperature in the arms and legs.*** Deep Tendon Reflexes: 2+ and symmetric in the brachioradialis and patellae. *** Plantars: Toes are downgoing bilaterally. *** Cerebellar: FNF and HKS are intact bilaterally*** Gait:  Deferred in acute setting ***  NIHSS total *** Score breakdown: *** Performed at *** time of patient arrival to ED    I have reviewed labs in epic and the results pertinent to this consultation are: ***  I have reviewed the images obtained:***   Impression: ***  Preliminary Recommendations: - Admit for PT/OT and close monitoring - Hold Eliquis pending full neuro eval, given small stroke size likely will resume tomorrow - CTA head/neck - ECHO will not change management, okay to hold off if no new cardiac symptoms - A1c/Lipid panel - Full consult to follow   Lesleigh Noe MD-PhD [facility] [phone]   *** [facility], [facility], Teleneuro    Total critical care time: *** minutes   Critical care time was exclusive of separately billable procedures and treating other patients.   Critical care was necessary to treat or prevent imminent or life-threatening deterioration.   Critical care was time spent personally by me on the following activities: development of  treatment plan with patient and/or surrogate as well as nursing, discussions with consultants/primary team, evaluation of patient's response to treatment, examination of patient, obtaining history from patient or surrogate, ordering and performing treatments and interventions, ordering and review of laboratory studies, ordering and review of radiographic studies, and re-evaluation of patient's condition as needed, as documented above.

## 2022-02-11 NOTE — ED Notes (Signed)
Patient has a urine culture in the main lab 

## 2022-02-11 NOTE — ED Triage Notes (Signed)
EMS reports from home, c/o left hand tingling and weakness and left shoulder pain at 10:30am. Hx of strike with left sided deficit noted.  BP 110/72 HR 74 RR 20 Sp02 93 RA

## 2022-02-11 NOTE — ED Notes (Signed)
[facility] called for pt. Transportation to [facility]

## 2022-02-11 NOTE — H&P (Signed)
History and Physical    David Hendricks KDX:[id number] DOB: 1950/05/31  DOA: 02/11/2022 PCP: Wardell Honour, MD  Patient coming from: Home  Chief Complaint: Left hand weakness   HPI: David Hendricks is a 72 y.o. male with medical history significant of CAD, CVA with no residual symptoms, PAF, BPH, depression and anxiety, COPD, tobacco abuse and hyperlipidemia presented to the emergency department complaining of left arm weakness.  Symptoms started today around 10 AM when he felt some tingling/numbness sensation unable to use or move his left arm.  He had similar symptoms 2 years ago with his prior stroke however he recovered to normal function after physical therapy.  He denies any other associated symptoms, like facial droopiness, difficulty with speech or swallowing, lower extremity weakness and memory issues or confusion.  Patient smoke about 2 packs of cigarettes a day.  He is noncompliant with his medications.  He has not taken Eliquis in over a month.  He denies any chest pain, shortness of breath, dizziness, palpitations and syncope.  He also report some intermittent epigastric pain that typically occurs after eating.  ED Course: CT head was normal, however MRI of the brain showed small cortical infarct in the posterior frontal lobes bilaterally.  Multilevel cervical degenerative changes and multinodular thyroid goiter.  Review of Systems:    All 10 review of system review and negative except, pertinent positive documented above.   Past Medical History:  Diagnosis Date   Aortic atherosclerosis (HCC)    Arthritis    Benign prostatic hyperplasia (BPH) with urinary urgency    COPD (chronic obstructive pulmonary disease) ([address])    Coronary artery disease    10/18 PCI/DES to pLAD, mild nonobstructive disease in the Lcx/RCA. Normal EF.    Depression with anxiety    Dyslipidemia    Dysrhythmia 2016   irregular heartbeat   Gallstones    GERD (gastroesophageal reflux disease)    History of  kidney stones    Hyperthyroidism    Leg cramps    Multiple pulmonary nodules    Myocardial infarction ([place])    2018   Pneumonia    Seasonal allergies    Stroke [facility])    Vitamin B 12 deficiency     Past Surgical History:  Procedure Laterality Date   COLONOSCOPY     CORONARY ANGIOPLASTY  10/09/2016   CORONARY STENT INTERVENTION N/A 10/09/2016   Procedure: CORONARY STENT INTERVENTION;  Surgeon: Nelva Bush, MD;  Location: [place] [facility];  Service: Cardiovascular;  Laterality: N/A;   CYSTOSCOPY WITH RETROGRADE PYELOGRAM, URETEROSCOPY AND STENT PLACEMENT Left 06/17/2019   Procedure: CYSTOSCOPY WITH RETROGRADE PYELOGRAM, URETEROSCOPY AND STENT PLACEMENT;  Surgeon: Robley Fries, MD;  Location: [facility];  Service: Urology;  Laterality: Left;  1 HR   HOLMIUM LASER APPLICATION Left 09/15/6732   Procedure: HOLMIUM LASER APPLICATION;  Surgeon: Robley Fries, MD;  Location: [facility];  Service: Urology;  Laterality: Left;   INTRAVASCULAR ULTRASOUND/IVUS N/A 10/09/2016   Procedure: Intravascular Ultrasound/IVUS;  Surgeon: Nelva Bush, MD;  Location: [place] [facility];  Service: Cardiovascular;  Laterality: N/A;   IR CHOLANGIOGRAM EXISTING TUBE  11/18/2018   IR PERC CHOLECYSTOSTOMY  11/16/2018   LEFT HEART CATH AND CORONARY ANGIOGRAPHY N/A 10/09/2016   Procedure: LEFT HEART CATH AND CORONARY ANGIOGRAPHY;  Surgeon: Nelva Bush, MD;  Location: [place] [facility];  Service: Cardiovascular;  Laterality: N/A;   LOOP RECORDER INSERTION N/A 02/20/2020   Procedure: LOOP RECORDER INSERTION;  Surgeon: Vickie Epley, MD;  Location: [place] [facility];  Service: Cardiovascular;  Laterality: N/A;     reports that he has been smoking cigarettes. He started smoking about 61 years ago. He has a 90.00 pack-year smoking history. He has never used smokeless tobacco. He reports that he does not currently use alcohol. He reports that he does not use drugs.  No Known Allergies  Family History   Problem Relation Age of Onset   Alcohol abuse Father        called [facility] per pt   Cancer Father        [facility] PER PT NON SURGICAL    Colon cancer Neg Hx    Colon polyps Neg Hx    Esophageal cancer Neg Hx    Rectal cancer Neg Hx    Stomach cancer Neg Hx     Prior to Admission medications   Medication Sig Start Date End Date Taking? Authorizing Provider  acetaminophen (TYLENOL) 500 MG tablet Take 1,000 mg by mouth every 6 (six) hours as needed for mild pain.    [provider]  apixaban (ELIQUIS) 5 MG TABS tablet Take 1 tablet (5 mg total) by mouth 2 (two) times daily. 02/15/21   Vickie Epley, MD  aspirin EC 81 MG tablet Take 1 tablet (81 mg total) by mouth daily. Swallow whole. 04/27/20   Vickie Epley, MD  atorvastatin (LIPITOR) 80 MG tablet Take 1 tablet (80 mg total) by mouth daily. 03/08/20   Angiulli, Lavon Paganini, PA-C  Baclofen 5 MG TABS Take 5 mg by mouth 4 (four) times daily. 03/31/20   Lovorn, Jinny Blossom, MD  Blood Pressure Monitoring (COMFORT TOUCH BP CUFF/LARGE) MISC 1 Device by Other route 2 (two) times daily. Use to check Blood Pressure twice daily. Dx: I10 02/22/21   Wardell Honour, MD  citalopram (CELEXA) 20 MG tablet Take 20 mg by mouth daily.    [provider]  gabapentin (NEURONTIN) 300 MG capsule Take 1 capsule (300 mg total) by mouth 2 (two) times daily. 02/15/21   Wardell Honour, MD  HYDROcodone-acetaminophen (NORCO/VICODIN) 5-325 MG tablet Take 2 tablets by mouth every 4 (four) hours as needed. 07/04/21   Kommor, Madison, MD  melatonin 3 MG TABS tablet Take 1 tablet (3 mg total) by mouth at bedtime. 03/08/20   Angiulli, Lavon Paganini, PA-C  methimazole (TAPAZOLE) 5 MG tablet TAKE 1 TABLET(5 MG) BY MOUTH DAILY 03/08/20   Angiulli, Lavon Paganini, PA-C  methylPREDNISolone (MEDROL DOSEPAK) 4 MG TBPK tablet Take as prescribed 07/04/21   Kommor, Madison, MD  metoCLOPramide (REGLAN) 5 MG tablet Take 1 tablet (5 mg total) by mouth 2 (two) times daily  as needed for nausea. 01/20/21   Fargo, Amy E, NP  metoprolol succinate (TOPROL XL) 25 MG 24 hr tablet Take 1 tablet (25 mg total) by mouth daily. Please make yearly appt with Dr. Quentin Ore for April 2023 for future refills. Thank you 1st attempt 02/15/21   Vickie Epley, MD  tamsulosin [facility]) 0.4 MG CAPS capsule Take 2 capsules (0.8 mg total) by mouth daily after supper. 01/20/21   Fargo, Amy E, NP  traZODone (DESYREL) 100 MG tablet TAKE 1 TABLET BY MOUTH AT BEDTIME Patient taking differently: Take 100 mg by mouth at bedtime as needed for sleep. 11/02/21   Wardell Honour, MD    Physical Exam: Vitals:   02/11/22 1500 02/11/22 1600 02/11/22 1730 02/11/22 1830  BP: 91/76 107/72 108/76 (!) 125/100  Pulse: 79 78  71 71  Resp: '18 18 16 16  '$ Temp:   97.7 F (36.5 C)   TempSrc:   Oral   SpO2: 98% 90% 95% 96%     Constitutional: NAD, calm, comfortable Eyes: PERRL, lids and conjunctivae normal ENMT: Mucous membranes are moist. Posterior pharynx clear of any exudate or lesions.Normal dentition.  Neck: normal, supple, no masses, no thyromegaly Respiratory: clear to auscultation bilaterally, no wheezing, no crackles. Normal respiratory effort. No accessory muscle use.  Cardiovascular: Irregular rate @ 82 BPM. no murmurs / rubs / gallops. No extremity edema. 2+ pedal pulses. No carotid bruits.  Abdomen: no tenderness, no masses palpated. No hepatosplenomegaly. Bowel sounds positive.  Musculoskeletal: no clubbing / cyanosis. No joint deformity upper and lower extremities. Good ROM, no contractures. Normal muscle tone.  Skin: no rashes, lesions, ulcers. No induration Neurologic: CN 2-12 grossly intact.  Sensation on left upper extremity slightly diminished when compared to the right.  Handgrip 4.5/5 on left, 5/5 on right.  Lower extremity strength and sensation is normal.  No dysarthria. Psychiatric: Normal judgment and insight. Alert and oriented x 3. Normal mood.    Labs on Admission: I have  personally reviewed following labs and imaging studies  CBC: Recent Labs  Lab 02/11/22 1459  WBC 6.9  NEUTROABS 4.1  HGB 17.1*  HCT 51.9  MCV 86.1  PLT 882*   Basic Metabolic Panel: Recent Labs  Lab 02/11/22 1459  NA 139  K 4.2  CL 104  CO2 27  GLUCOSE 98  BUN 11  CREATININE 1.05  CALCIUM 8.5*   GFR: Estimated Creatinine Clearance: 71.9 mL/min (by C-G formula based on SCr of 1.05 mg/dL). Liver Function Tests: Recent Labs  Lab 02/11/22 1459  AST 18  ALT 13  ALKPHOS 84  BILITOT 1.4*  PROT 5.9*  ALBUMIN 3.5   No results for input(s): "LIPASE", "AMYLASE" in the last 168 hours. No results for input(s): "AMMONIA" in the last 168 hours. Coagulation Profile: Recent Labs  Lab 02/11/22 1459  INR 1.0   Cardiac Enzymes: No results for input(s): "CKTOTAL", "CKMB", "CKMBINDEX", "TROPONINI" in the last 168 hours. BNP (last 3 results) No results for input(s): "PROBNP" in the last 8760 hours. HbA1C: No results for input(s): "HGBA1C" in the last 72 hours. CBG: No results for input(s): "GLUCAP" in the last 168 hours. Lipid Profile: Recent Labs    02/11/22 1459  CHOL 156  HDL 36*  LDLCALC 99  TRIG 104  CHOLHDL 4.3   Thyroid Function Tests: No results for input(s): "TSH", "T4TOTAL", "FREET4", "T3FREE", "THYROIDAB" in the last 72 hours. Anemia Panel: No results for input(s): "VITAMINB12", "FOLATE", "FERRITIN", "TIBC", "IRON", "RETICCTPCT" in the last 72 hours. Urine analysis:    Component Value Date/Time   COLORURINE YELLOW 02/11/2022 1859   APPEARANCEUR CLEAR 02/11/2022 1859   APPEARANCEUR Clear 08/08/2016 0900   LABSPEC 1.008 02/11/2022 1859   PHURINE 5.0 02/11/2022 1859   GLUCOSEU NEGATIVE 02/11/2022 1859   HGBUR NEGATIVE 02/11/2022 1859   BILIRUBINUR NEGATIVE 02/11/2022 1859   BILIRUBINUR negative 05/02/2018 1337   BILIRUBINUR Negative 08/08/2016 0900   KETONESUR NEGATIVE 02/11/2022 1859   PROTEINUR NEGATIVE 02/11/2022 1859   UROBILINOGEN 0.2  05/02/2018 1337   NITRITE NEGATIVE 02/11/2022 1859   LEUKOCYTESUR NEGATIVE 02/11/2022 1859   Sepsis Labs: !!!!!!!!!!!!!!!!!!!!!!!!!!!!!!!!!!!!!!!!!!!! '@LABRCNTIP'$ (procalcitonin:4,lacticidven:4) )No results found for this or any previous visit (from the past 240 hour(s)).   Radiological Exams on Admission: CT ANGIO HEAD NECK W WO CM  Result Date: 02/11/2022 CLINICAL DATA:  Left  hand tingling and weakness EXAM: CT ANGIOGRAPHY HEAD AND NECK TECHNIQUE: Multidetector CT imaging of the head and neck was performed using the standard protocol during bolus administration of intravenous contrast. Multiplanar CT image reconstructions and MIPs were obtained to evaluate the vascular anatomy. Carotid stenosis measurements (when applicable) are obtained utilizing NASCET criteria, using the distal internal carotid diameter as the denominator. RADIATION DOSE REDUCTION: This exam was performed according to the departmental dose-optimization program which includes automated exposure control, adjustment of the mA and/or kV according to patient size and/or use of iterative reconstruction technique. CONTRAST:  84m OMNIPAQUE IOHEXOL 350 MG/ML SOLN COMPARISON:  None Available. FINDINGS: CTA NECK FINDINGS SKELETON: There is no bony spinal canal stenosis. No lytic or blastic lesion. OTHER NECK: There is a large multinodular goiter, left asymmetric with a large component extending into the upper mediastinum. UPPER CHEST: Biapical emphysema. AORTIC ARCH: There is calcific atherosclerosis of the aortic arch. There is no aneurysm, dissection or hemodynamically significant stenosis of the visualized portion of the aorta. Conventional 3 vessel aortic branching pattern. The visualized proximal subclavian arteries are widely patent. RIGHT CAROTID SYSTEM: Normal without aneurysm, dissection or stenosis. LEFT CAROTID SYSTEM: Normal without aneurysm, dissection or stenosis. VERTEBRAL ARTERIES: Left dominant configuration. Both origins are  clearly patent. There is no dissection, occlusion or flow-limiting stenosis to the skull base (V1-V3 segments). CTA HEAD FINDINGS POSTERIOR CIRCULATION: --Vertebral arteries: Normal V4 segments. --Inferior cerebellar arteries: Normal. --Basilar artery: Normal. --Superior cerebellar arteries: Normal. --Posterior cerebral arteries (PCA): Normal. ANTERIOR CIRCULATION: --Intracranial internal carotid arteries: Normal. --Anterior cerebral arteries (ACA): Normal. Both A1 segments are present. Patent anterior communicating artery (a-comm). --Middle cerebral arteries (MCA): Normal. VENOUS SINUSES: As permitted by contrast timing, patent. ANATOMIC VARIANTS: Fetal origins of both posterior cerebral arteries. Review of the MIP images confirms the above findings. IMPRESSION: 1. No emergent large vessel occlusion or high-grade stenosis of the intracranial arteries. 2. Incidental heterogeneous and enlarged thyroid. Recommend non-emergent thyroid ultrasound. Reference: J Am Coll Radiol. 2015 Feb;12(2): 143-50 Aortic Atherosclerosis (ICD10-I70.0) and Emphysema (ICD10-J43.9). Electronically Signed   By: KUlyses JarredM.D.   On: 02/11/2022 20:59   MR BRAIN WO CONTRAST  Result Date: 02/11/2022 CLINICAL DATA:  Stroke suspected EXAM: MRI HEAD WITHOUT CONTRAST MRI CERVICAL SPINE WITHOUT CONTRAST TECHNIQUE: Multiplanar, multiecho pulse sequences of the brain and surrounding structures, and cervical spine, to include the craniocervical junction and cervicothoracic junction, were obtained without intravenous contrast. COMPARISON:  MRI Brain 08/19/20 FINDINGS: MRI HEAD FINDINGS Brain: Small cortical infarcts in the posterior frontal lobes bilaterally (series 5, image 49, 46). Chronic infarct in the anterior left frontal lobe with possible cortical laminar necrosis. Sequela of severe chronic microvascular ischemic change. Redemonstrated chronic cerebellar infarctsr bilateraally. no evidence of a parenchymal hematoma. No hydrocephalus.  Advanced generalized volume loss. Vascular: Normal flow voids. Skull and upper cervical spine: Normal marrow signal. Sinuses/Orbits: Bilateral lens replacement. No mastoid or middle ear effusion. Paranasal sinuses are generally clear. Other: None. MRI CERVICAL SPINE FINDINGS Alignment: Physiologic. Vertebrae: No fracture, evidence of discitis, or bone lesion. Cord: Normal signal and morphology. Posterior Fossa, vertebral arteries, paraspinal tissues: There is a multiple multinodular thyroid goiter on the left with likely substernal extension. Disc levels: C1-C2: Mild degenerative change. C2-C3: No significant disc bulge. Moderate left and mild right facet degenerative change. Moderate left neural foraminal narrowing. No spinal canal narrowing. C3-C4: Moderate bilateral facet degenerative change. No significant disc bulge. Ligamentum flavum hypertrophy. Mild spinal canal narrowing. Moderate bilateral neural foraminal narrowing. C4-C5: No significant disc  bulge. Moderate to severe bilateral facet degenerative change. Ligamentum flavum hypertrophy. Mild spinal canal narrowing. Moderate bilateral neural foraminal narrowing C5-C6: Moderate bilateral facet degenerative change. Minimal disc bulge. Mild spinal canal narrowing. Mild-to-moderate bilateral neural foraminal narrowing. C6-C7: Mild bilateral facet degenerative change. No significant spinal canal stenosis. No significant disc bulge. No neural foraminal stenosis. C7-T1: Unremarkable. IMPRESSION: 1. Small cortical infarcts in the posterior frontal lobes bilaterally. 2. No acute cervical spine fracture or malalignment. 3. Multilevel neural foraminal narrowing, with moderate neural foraminal narrowing on the left at C2-C3 and bilaterally at C3-C4 and C4-C5. 4. Mild spinal canal narrowing at C3-C4, C4-C5, and C5-C6. 5. 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 These results will be called to the ordering clinician or representative by  the Radiologist Assistant, and communication documented in the PACS or [employer]. Electronically Signed   By: Marin Roberts M.D.   On: 02/11/2022 19:02   MR Cervical Spine Wo Contrast  Result Date: 02/11/2022 CLINICAL DATA:  Stroke suspected EXAM: MRI HEAD WITHOUT CONTRAST MRI CERVICAL SPINE WITHOUT CONTRAST TECHNIQUE: Multiplanar, multiecho pulse sequences of the brain and surrounding structures, and cervical spine, to include the craniocervical junction and cervicothoracic junction, were obtained without intravenous contrast. COMPARISON:  MRI Brain 08/19/20 FINDINGS: MRI HEAD FINDINGS Brain: Small cortical infarcts in the posterior frontal lobes bilaterally (series 5, image 49, 46). Chronic infarct in the anterior left frontal lobe with possible cortical laminar necrosis. Sequela of severe chronic microvascular ischemic change. Redemonstrated chronic cerebellar infarctsr bilateraally. no evidence of a parenchymal hematoma. No hydrocephalus. Advanced generalized volume loss. Vascular: Normal flow voids. Skull and upper cervical spine: Normal marrow signal. Sinuses/Orbits: Bilateral lens replacement. No mastoid or middle ear effusion. Paranasal sinuses are generally clear. Other: None. MRI CERVICAL SPINE FINDINGS Alignment: Physiologic. Vertebrae: No fracture, evidence of discitis, or bone lesion. Cord: Normal signal and morphology. Posterior Fossa, vertebral arteries, paraspinal tissues: There is a multiple multinodular thyroid goiter on the left with likely substernal extension. Disc levels: C1-C2: Mild degenerative change. C2-C3: No significant disc bulge. Moderate left and mild right facet degenerative change. Moderate left neural foraminal narrowing. No spinal canal narrowing. C3-C4: Moderate bilateral facet degenerative change. No significant disc bulge. Ligamentum flavum hypertrophy. Mild spinal canal narrowing. Moderate bilateral neural foraminal narrowing. C4-C5: No significant disc bulge. Moderate  to severe bilateral facet degenerative change. Ligamentum flavum hypertrophy. Mild spinal canal narrowing. Moderate bilateral neural foraminal narrowing C5-C6: Moderate bilateral facet degenerative change. Minimal disc bulge. Mild spinal canal narrowing. Mild-to-moderate bilateral neural foraminal narrowing. C6-C7: Mild bilateral facet degenerative change. No significant spinal canal stenosis. No significant disc bulge. No neural foraminal stenosis. C7-T1: Unremarkable. IMPRESSION: 1. Small cortical infarcts in the posterior frontal lobes bilaterally. 2. No acute cervical spine fracture or malalignment. 3. Multilevel neural foraminal narrowing, with moderate neural foraminal narrowing on the left at C2-C3 and bilaterally at C3-C4 and C4-C5. 4. Mild spinal canal narrowing at C3-C4, C4-C5, and C5-C6. 5. 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 These results will be called to the ordering clinician or representative by the Radiologist Assistant, and communication documented in the PACS or [employer]. Electronically Signed   By: Marin Roberts M.D.   On: 02/11/2022 19:02   CT HEAD WO CONTRAST  Result Date: 02/11/2022 CLINICAL DATA:  Arm and shoulder pain, neurologic deficit EXAM: CT HEAD WITHOUT CONTRAST TECHNIQUE: Contiguous axial images were obtained from the base of the skull through the vertex without intravenous contrast. RADIATION DOSE REDUCTION:  This exam was performed according to the departmental dose-optimization program which includes automated exposure control, adjustment of the mA and/or kV according to patient size and/or use of iterative reconstruction technique. COMPARISON:  02/01/2022 FINDINGS: Brain: Stable chronic small-vessel ischemic changes throughout the periventricular white matter and bilateral basal ganglia. No evidence of acute infarct or hemorrhage. Lateral ventricles and remaining midline structures are stable. No acute extra-axial fluid collections.  No mass effect. Vascular: No hyperdense vessel or unexpected calcification. Skull: Normal. Negative for fracture or focal lesion. Sinuses/Orbits: No acute finding. Other: None. IMPRESSION: 1. Stable head CT, no acute intracranial process. Electronically Signed   By: Randa Ngo M.D.   On: 02/11/2022 15:01    EKG: Independently reviewed.   Assessment/Plan # Ischemic cerebrovascular accident (CVA) (HCC)/Stroke ([place]) Recurrent stroke, noncompliant with medications and heavy smoker.  Neurology consulted and recommended admission to [facility] and hold Eliquis tomorrow and resume in AM.  ASA tonight. Will obtain echocardiogram, TSH, Lipids and A1C. PT/OT eval.   # Essential hypertension Chronic, slight above goal.  Will allow permissive hypertension tonight.  Resume antihypertensive medications in the morning.  # PAF (paroxysmal atrial fibrillation) (HCC) On A-fib, rate controlled.  He has a loop recorder. Starting Eliquis in a.m, holding metoprolol.  # Tobacco abuse Tobacco cessation discussed.  Nicotine replacement therapy  # Hyperlipidemia Chronic, noncompliant with medication.  Resume Lipitor.  Check lipid panel.  # CAD (coronary artery disease) Chronic, asymptomatic.  # Anxiety and depression Chronic, stable. Continue trazodone and Celexa.  # Benign prostatic hyperplasia with urinary frequency Chronic, stable, he is not using any medication, will hold tamsulosin for now.  # GERD (gastroesophageal reflux disease) Protonix.   DVT prophylaxis: Eliquis  Code Status: Full Family Communication: None present, patient does not have any one, that he want [place] to discuss  Disposition Plan: Anticipate discharge to previous home environment in 2-3 day  Consults called: Neurology, By ER   Admission status: Inpatient to [facility] MD [facility] Pager: Text Page via [email or web address]  [phone]  If 7PM-7AM, please contact night-coverage [email or web address]  02/11/2022,  9:29 PM

## 2022-02-12 ENCOUNTER — Inpatient Hospital Stay (HOSPITAL_COMMUNITY): Payer: Medicare Other

## 2022-02-12 DIAGNOSIS — I4821 Permanent atrial fibrillation: Secondary | ICD-10-CM

## 2022-02-12 DIAGNOSIS — I6389 Other cerebral infarction: Secondary | ICD-10-CM | POA: Diagnosis not present

## 2022-02-12 DIAGNOSIS — Z720 Tobacco use: Secondary | ICD-10-CM

## 2022-02-12 LAB — COMPREHENSIVE METABOLIC PANEL
ALT: 12 U/L (ref 0–44)
AST: 15 U/L (ref 15–41)
Albumin: 3.1 g/dL — ABNORMAL LOW (ref 3.5–5.0)
Alkaline Phosphatase: 81 U/L (ref 38–126)
Anion gap: 4 — ABNORMAL LOW (ref 5–15)
BUN: 9 mg/dL (ref 8–23)
CO2: 30 mmol/L (ref 22–32)
Calcium: 8.7 mg/dL — ABNORMAL LOW (ref 8.9–10.3)
Chloride: 104 mmol/L (ref 98–111)
Creatinine, Ser: 1.2 mg/dL (ref 0.61–1.24)
GFR, Estimated: >60 mL/min (ref >60)
Glucose, Bld: 95 mg/dL (ref 70–99)
Potassium: 3.8 mmol/L (ref 3.5–5.1)
Sodium: 138 mmol/L (ref 135–145)
Total Bilirubin: 1.3 mg/dL — ABNORMAL HIGH (ref 0.3–1.2)
Total Protein: 5.3 g/dL — ABNORMAL LOW (ref 6.5–8.1)

## 2022-02-12 LAB — CBC WITH DIFFERENTIAL/PLATELET
Abs Immature Granulocytes: 0.02 10*3/uL (ref 0.00–0.07)
Basophils Absolute: 0.1 10*3/uL (ref 0.0–0.1)
Basophils Relative: 1 %
Eosinophils Absolute: 0.2 10*3/uL (ref 0.0–0.5)
Eosinophils Relative: 3 %
HCT: 49.9 % (ref 39.0–52.0)
Hemoglobin: 16.6 g/dL (ref 13.0–17.0)
Immature Granulocytes: 0 %
Lymphocytes Relative: 36 %
Lymphs Abs: 2.8 10*3/uL (ref 0.7–4.0)
MCH: 28.9 pg (ref 26.0–34.0)
MCHC: 33.3 g/dL (ref 30.0–36.0)
MCV: 86.9 fL (ref 80.0–100.0)
Monocytes Absolute: 0.5 10*3/uL (ref 0.1–1.0)
Monocytes Relative: 6 %
Neutro Abs: 4.4 10*3/uL (ref 1.7–7.7)
Neutrophils Relative %: 54 %
Platelets: 122 10*3/uL — ABNORMAL LOW (ref 150–400)
RBC: 5.74 MIL/uL (ref 4.22–5.81)
RDW: 13.5 % (ref 11.5–15.5)
WBC: 8 10*3/uL (ref 4.0–10.5)
nRBC: 0 % (ref 0.0–0.2)

## 2022-02-12 LAB — ECHOCARDIOGRAM COMPLETE
AR max vel: 2.67 cm2
AV Area VTI: 2.55 cm2
AV Area mean vel: 2.43 cm2
AV Mean grad: 2 mmHg
AV Peak grad: 3.6 mmHg
AV Vena cont: 0.3 cm
Ao pk vel: 0.95 m/s
Area-P 1/2: 3.76 cm2
MV M vel: 4.74 m/s
MV Peak grad: 89.9 mmHg
S' Lateral: 3.7 cm

## 2022-02-12 LAB — HEMOGLOBIN A1C
Hgb A1c MFr Bld: 5.3 % (ref 4.8–5.6)
Hgb A1c MFr Bld: 5.2 % (ref 4.8–5.6)
Mean Plasma Glucose: 105.41 mg/dL
Mean Plasma Glucose: 102.54 mg/dL

## 2022-02-12 LAB — LIPID PANEL
Cholesterol: 149 mg/dL (ref 0–200)
HDL: 32 mg/dL — ABNORMAL LOW (ref >40)
LDL Cholesterol: 92 mg/dL (ref 0–99)
Total CHOL/HDL Ratio: 4.7 RATIO
Triglycerides: 125 mg/dL (ref <150)
VLDL: 25 mg/dL (ref 0–40)

## 2022-02-12 LAB — TSH: TSH: 0.757 u[IU]/mL (ref 0.350–4.500)

## 2022-02-12 MED ORDER — TRAZODONE HCL 100 MG PO TABS: 100.0000 mg | ORAL_TABLET | Freq: Every evening | ORAL | 0 refills | Status: DC | PRN

## 2022-02-12 MED ORDER — CITALOPRAM HYDROBROMIDE 20 MG PO TABS: 20.0000 mg | ORAL_TABLET | Freq: Every day | ORAL | 0 refills | Status: DC

## 2022-02-12 MED ORDER — VITAMIN B-12 1000 MCG PO TABS: 1000.0000 ug | ORAL_TABLET | Freq: Every day | ORAL | 3 refills | Status: AC

## 2022-02-12 MED ORDER — ATORVASTATIN CALCIUM 80 MG PO TABS: 80.0000 mg | ORAL_TABLET | Freq: Every day | ORAL | 3 refills | Status: AC

## 2022-02-12 MED ORDER — APIXABAN 5 MG PO TABS: 5.0000 mg | ORAL_TABLET | Freq: Two times a day (BID) | ORAL | 5 refills | Status: AC

## 2022-02-12 MED ORDER — METHIMAZOLE 5 MG PO TABS
5.0000 mg | ORAL_TABLET | Freq: Every day | ORAL | Status: DC
  Administered 2022-02-12: 5 mg via ORAL
  Filled 2022-02-12: qty 1

## 2022-02-12 MED ORDER — METHIMAZOLE 5 MG PO TABS: 5.0000 mg | ORAL_TABLET | Freq: Every day | ORAL | 0 refills | Status: AC

## 2022-02-12 MED ORDER — METOPROLOL SUCCINATE ER 25 MG PO TB24: 25.0000 mg | ORAL_TABLET | Freq: Every day | ORAL | 2 refills | Status: DC

## 2022-02-12 MED ORDER — TRAZODONE HCL 100 MG PO TABS: 100.0000 mg | ORAL_TABLET | Freq: Every day | ORAL | Status: DC

## 2022-02-12 NOTE — Evaluation (Signed)
Physical Therapy Evaluation and Discharge Patient Details Name: David Hendricks MRN: [id number] DOB: 06/17/1950 Today's Date: 02/12/2022  History of Present Illness  Pt is a 72 y.o. male presenting to [facility] ED 2/3 with L hand tingling and weakness as well as L shoulder pain. Transferred to [facility] for further work up. MRI revealed small cortical infarcts in the posterior frontal lobes bilaterally; multilevel neural foraminal narrowing on L at C2-3 and bil at C3-5. PMH significant for CAD, CVA with no residual symptoms, PAF, BPH, depression and anxiety, COPD, tobacco abuse and hyperlipidemia  Clinical Impression   Patient evaluated by Physical Therapy with pt reporting decr in balance with frequent losses of balance when turning. Scored 18/24 on Dynamic Gait Index (<19 indicative of incr fall risk) and 44/56 on Berg Balance Assessment (<45 indicative of nearly 100% fall risk). Anticipate pt can benefit from HHPT to address balance deficits and establish home program. PT is signing off due to anticipated discharge home today. Thank you for this referral.        Recommendations for follow up therapy are one component of a multi-disciplinary discharge planning process, led by the attending physician.  Recommendations may be updated based on patient status, additional functional criteria and insurance authorization.  Follow Up Recommendations Home health PT (pt reports worsening balance and not at his baseline)      Assistance Recommended at Discharge PRN  Patient can return home with the following  Assist for transportation    Equipment Recommendations None recommended by PT  Recommendations for Other Services       Functional Status Assessment Patient has had a recent decline in their functional status and demonstrates the ability to make significant improvements in function in a reasonable and predictable amount of time.     Precautions / Restrictions Precautions Precautions: Fall      Mobility   Bed Mobility               General bed mobility comments: up in chair    Transfers Overall transfer level: Independent Equipment used: None               General transfer comment: no imbalance and denied dizziness    Ambulation/Gait Ambulation/Gait assistance: Supervision, Independent Gait Distance (Feet): 300 Feet Assistive device: None Gait Pattern/deviations: Step-through pattern, Decreased stride length, Decreased dorsiflexion - right, Decreased dorsiflexion - left, Knee flexed in stance - right, Knee flexed in stance - left, Shuffle, Drifts right/left   Gait velocity interpretation: 1.31 - 2.62 ft/sec, indicative of limited community ambulator   General Gait Details: with normal gait, pt able to maintain path and balance and walked independently; with gait challnenges (head turns, vary velocity, avoid obstacles, step over obstacle) required supervision with +drift or feeling unsteady  Stairs            Wheelchair Mobility    Modified Rankin (Stroke Patients Only) Modified Rankin (Stroke Patients Only) Pre-Morbid Rankin Score: No significant disability Modified Rankin: Slight disability     Balance                                 Standardized Balance Assessment Standardized Balance Assessment : Berg Balance Test, Dynamic Gait Index Berg Balance Test Sit to Stand: Able to stand without using hands and stabilize independently Standing Unsupported: Able to stand safely 2 minutes Sitting with Back Unsupported but Feet Supported on Floor or Stool: Able to sit safely and  securely 2 minutes Stand to Sit: Sits safely with minimal use of hands Transfers: Able to transfer safely, minor use of hands Standing Unsupported with Eyes Closed: Able to stand 10 seconds safely Standing Ubsupported with Feet Together: Able to place feet together independently and stand 1 minute safely From Standing, Reach Forward with Outstretched Arm: Can reach forward  >12 cm safely (5") From Standing Position, Pick up Object from Floor: Unable to pick up shoe, but reaches 2-5 cm (1-2") from shoe and balances independently From Standing Position, Turn to Look Behind Over each Shoulder: Looks behind from both sides and weight shifts well Turn 360 Degrees: Able to turn 360 degrees safely in 4 seconds or less Standing Unsupported, Alternately Place Feet on Step/Stool: Needs assistance to keep from falling or unable to try Standing Unsupported, One Foot in Front: Able to take small step independently and hold 30 seconds Standing on One Leg: Tries to lift leg/unable to hold 3 seconds but remains standing independently Total Score: 44 Dynamic Gait Index Level Surface: Mild Impairment Change in Gait Speed: Normal Gait with Horizontal Head Turns: Mild Impairment Gait with Vertical Head Turns: Mild Impairment Gait and Pivot Turn: Normal Step Over Obstacle: Mild Impairment Step Around Obstacles: Mild Impairment Steps: Mild Impairment Total Score: 18       Pertinent Vitals/Pain Pain Assessment Pain Assessment: No/denies pain    Home Living Family/patient expects to be discharged to:: Private residence Living Arrangements: Alone Available Help at Discharge: Friend(s);Available PRN/intermittently Type of Home: Apartment Home Access: Level entry       Home Layout: One level Home Equipment: Cane - single point      Prior Function Prior Level of Function : Independent/Modified Independent             Mobility Comments: not using cane       Hand Dominance   Dominant Hand: Left    Extremity/Trunk Assessment   Upper Extremity Assessment Upper Extremity Assessment: Defer to OT evaluation    Lower Extremity Assessment Lower Extremity Assessment: Generalized weakness    Cervical / Trunk Assessment Cervical / Trunk Assessment: Kyphotic  Communication   Communication: No difficulties  Cognition Arousal/Alertness: Awake/alert Behavior  During Therapy: WFL for tasks assessed/performed Overall Cognitive Status: Impaired/Different from baseline Area of Impairment: Attention, Awareness, Problem solving                   Current Attention Level: Selective       Awareness: Emergent Problem Solving: Requires verbal cues          General Comments      Exercises     Assessment/Plan    PT Assessment All further PT needs can be met in the next venue of care  PT Problem List Decreased strength;Decreased balance;Decreased mobility       PT Treatment Interventions      PT Goals (Current goals can be found in the Care Plan section)  Acute Rehab PT Goals Patient Stated Goal: return home; improve his balance PT Goal Formulation: All assessment and education complete, DC therapy    Frequency       Co-evaluation               AM-PAC PT "6 Clicks" Mobility  Outcome Measure Help needed turning from your back to your side while in a flat bed without using bedrails?: None Help needed moving from lying on your back to sitting on the side of a flat bed without using bedrails?: None Help  needed moving to and from a bed to a chair (including a wheelchair)?: None Help needed standing up from a chair using your arms (e.g., wheelchair or bedside chair)?: None Help needed to walk in hospital room?: None Help needed climbing 3-5 steps with a railing? : None 6 Click Score: 24    End of Session Equipment Utilized During Treatment: Gait belt Activity Tolerance: Patient tolerated treatment well Patient left: in chair;with call bell/phone within reach;with nursing/sitter in room Nurse Communication: Mobility status PT Visit Diagnosis: Unsteadiness on feet (R26.81);Other abnormalities of gait and mobility (R26.89)    Time: 1350-1400 PT Time Calculation (min) (ACUTE ONLY): 10 min   Charges:   PT Evaluation $PT Eval Low Complexity: [place], PT Acute Rehabilitation Services  Office  [phone]   Rexanne Mano 02/12/2022, 2:14 PM

## 2022-02-12 NOTE — Progress Notes (Signed)
[facility] PROGRESS NOTE   David Hendricks XHB:[id number] DOB: 16-Sep-1950 DOA: 02/11/2022  PCP: Wardell Honour, MD  Brief History/Interval Summary:  72 y.o. male with medical history significant of CAD, CVA with no residual symptoms, PAF, BPH, depression and anxiety, COPD, tobacco abuse and hyperlipidemia presented to the emergency department complaining of left arm weakness primarily involving the hand.  Did not have any weakness in the lower extremities.  He had been noncompliant with his home medications including Eliquis.  Found to have an acute stroke.  Hospitalized for further management.   Consultants: Neurology  Procedures: Echocardiogram is pending    Subjective/Interval History: Patient mentions that the left hand weakness appears to have improved slightly.  Denies any new complaints this morning.  Would like to go home this evening.  He was told about the importance of being compliant with his medications and to stop smoking cigarettes.  He mentions that he has problems getting transportation for his doctor appointments.    Assessment/Plan:  Acute stroke Patient presented with weakness of the left upper extremity.  Found to have acute stroke on MRI. Patient has been noncompliant with Eliquis.  Importance of compliance emphasized to the patient today.  Started back on Eliquis by neurology. LDL 92.  Started back on statin. HbA1c 5.2.  TSH 0.757.  Urine drug screen negative. PT OT evaluation is pending. Echocardiogram is pending.  Essential hypertension Permissive hypertension being allowed.  Paroxysmal atrial fibrillation Emphasized compliance to the patient.  Started back on Eliquis.  Metoprolol on hold.  Tobacco abuse Tobacco cessation discussed.  He smokes up to 2 packs of cigarettes on a daily basis.  Continue nicotine patch in the hospital.  Dyslipidemia Continue with statin.  History of coronary artery disease Stable.  History of anxiety and  depression Stable.  Continue Celexa and trazodone  Enlarged thyroid/hypothyroidism Incidentally noted on CT.  Patient has previous history of same.  He has been diagnosed with hyperparathyroidism previously.  Supposed to be on methimazole once a day.  This can be resumed. He had a ultrasound of his thyroid back in 2020.  Thyroid nodules were noted.  Looks like he underwent biopsy in 2021 no malignancy was noted. No further workup at this time.  Will defer to outpatient providers.  BPH Stable.  Transportation needs TOC consulted.  DVT Prophylaxis: On apixaban Code Status: Full code Family Communication: Discussed with patient Disposition Plan: To be determined  Status is: Inpatient Remains inpatient appropriate because: Acute stroke      Medications: Scheduled:  apixaban  5 mg Oral BID   aspirin  325 mg Oral Daily   atorvastatin  80 mg Oral Daily   citalopram  20 mg Oral Daily   methimazole  5 mg Oral Daily   nicotine  21 mg Transdermal Once   nicotine  21 mg Transdermal Q0600   pantoprazole  20 mg Oral Daily   traZODone  100 mg Oral QHS   Continuous:  sodium chloride 100 mL/hr (02/11/22 1502)   YBO:FBPZWCHENIDPO **OR** acetaminophen (TYLENOL) oral liquid 160 mg/5 mL **OR** acetaminophen, senna-docusate  Antibiotics: Anti-infectives (From admission, onward)    None       Objective:  Vital Signs  Vitals:   02/11/22 2153 02/12/22 0013 02/12/22 0348 02/12/22 0900  BP: 123/76 118/82 (!) 86/52 109/77  Pulse: 94 91 89 96  Resp: '18 18 18 18  '$ Temp: 97.7 F (36.5 C) 99.2 F (37.3 C) 97.6 F (36.4 C) 97.6 F (36.4 C)  TempSrc:  Oral Oral Oral Axillary  SpO2: 94% (!) 89% (!) 89% 99%    Intake/Output Summary (Last 24 hours) at 02/12/2022 0950 Last data filed at 02/11/2022 1903 Gross per 24 hour  Intake --  Output 600 ml  Net -600 ml   There were no vitals filed for this visit.  General appearance: Awake alert.  In no distress Resp: Clear to auscultation  bilaterally.  Normal effort Cardio: S1-S2 is normal regular.  No S3-S4.  No rubs murmurs or bruit GI: Abdomen is soft.  Nontender nondistended.  Bowel sounds are present normal.  No masses organomegaly Extremities: No edema.   Neurologic: Alert and oriented x3.  Weakness of the left upper extremity noted.   Lab Results:  Data Reviewed: I have personally reviewed following labs and reports of the imaging studies  CBC: Recent Labs  Lab 02/11/22 1459 02/12/22 0251  WBC 6.9 8.0  NEUTROABS 4.1 4.4  HGB 17.1* 16.6  HCT 51.9 49.9  MCV 86.1 86.9  PLT 127* 122*    Basic Metabolic Panel: Recent Labs  Lab 02/11/22 1459 02/12/22 0251  NA 139 138  K 4.2 3.8  CL 104 104  CO2 27 30  GLUCOSE 98 95  BUN 11 9  CREATININE 1.05 1.20  CALCIUM 8.5* 8.7*    GFR: Estimated Creatinine Clearance: 62.9 mL/min (by C-G formula based on SCr of 1.2 mg/dL).  Liver Function Tests: Recent Labs  Lab 02/11/22 1459 02/12/22 0251  AST 18 15  ALT 13 12  ALKPHOS 84 81  BILITOT 1.4* 1.3*  PROT 5.9* 5.3*  ALBUMIN 3.5 3.1*    Coagulation Profile: Recent Labs  Lab 02/11/22 1459  INR 1.0     HbA1C: Recent Labs    02/11/22 1459 02/12/22 0251  HGBA1C 5.3 5.2     Lipid Profile: Recent Labs    02/11/22 1459 02/12/22 0251  CHOL 156 149  HDL 36* 32*  LDLCALC 99 92  TRIG 104 125  CHOLHDL 4.3 4.7    Thyroid Function Tests: Recent Labs    02/12/22 0251  TSH 0.757     Radiology Studies: CT ANGIO HEAD NECK W WO CM  Result Date: 02/11/2022 CLINICAL DATA:  Left hand tingling and weakness EXAM: CT ANGIOGRAPHY HEAD AND NECK TECHNIQUE: Multidetector CT imaging of the head and neck was performed using the standard protocol during bolus administration of intravenous contrast. Multiplanar CT image reconstructions and MIPs were obtained to evaluate the vascular anatomy. Carotid stenosis measurements (when applicable) are obtained utilizing NASCET criteria, using the distal internal  carotid diameter as the denominator. RADIATION DOSE REDUCTION: This exam was performed according to the departmental dose-optimization program which includes automated exposure control, adjustment of the mA and/or kV according to patient size and/or use of iterative reconstruction technique. CONTRAST:  18m OMNIPAQUE IOHEXOL 350 MG/ML SOLN COMPARISON:  None Available. FINDINGS: CTA NECK FINDINGS SKELETON: There is no bony spinal canal stenosis. No lytic or blastic lesion. OTHER NECK: There is a large multinodular goiter, left asymmetric with a large component extending into the upper mediastinum. UPPER CHEST: Biapical emphysema. AORTIC ARCH: There is calcific atherosclerosis of the aortic arch. There is no aneurysm, dissection or hemodynamically significant stenosis of the visualized portion of the aorta. Conventional 3 vessel aortic branching pattern. The visualized proximal subclavian arteries are widely patent. RIGHT CAROTID SYSTEM: Normal without aneurysm, dissection or stenosis. LEFT CAROTID SYSTEM: Normal without aneurysm, dissection or stenosis. VERTEBRAL ARTERIES: Left dominant configuration. Both origins are clearly patent. There is no dissection,  occlusion or flow-limiting stenosis to the skull base (V1-V3 segments). CTA HEAD FINDINGS POSTERIOR CIRCULATION: --Vertebral arteries: Normal V4 segments. --Inferior cerebellar arteries: Normal. --Basilar artery: Normal. --Superior cerebellar arteries: Normal. --Posterior cerebral arteries (PCA): Normal. ANTERIOR CIRCULATION: --Intracranial internal carotid arteries: Normal. --Anterior cerebral arteries (ACA): Normal. Both A1 segments are present. Patent anterior communicating artery (a-comm). --Middle cerebral arteries (MCA): Normal. VENOUS SINUSES: As permitted by contrast timing, patent. ANATOMIC VARIANTS: Fetal origins of both posterior cerebral arteries. Review of the MIP images confirms the above findings. IMPRESSION: 1. No emergent large vessel occlusion or  high-grade stenosis of the intracranial arteries. 2. Incidental heterogeneous and enlarged thyroid. Recommend non-emergent thyroid ultrasound. Reference: J Am Coll Radiol. 2015 Feb;12(2): 143-50 Aortic Atherosclerosis (ICD10-I70.0) and Emphysema (ICD10-J43.9). Electronically Signed   By: Ulyses Jarred M.D.   On: 02/11/2022 20:59   MR BRAIN WO CONTRAST  Result Date: 02/11/2022 CLINICAL DATA:  Stroke suspected EXAM: MRI HEAD WITHOUT CONTRAST MRI CERVICAL SPINE WITHOUT CONTRAST TECHNIQUE: Multiplanar, multiecho pulse sequences of the brain and surrounding structures, and cervical spine, to include the craniocervical junction and cervicothoracic junction, were obtained without intravenous contrast. COMPARISON:  MRI Brain 08/19/20 FINDINGS: MRI HEAD FINDINGS Brain: Small cortical infarcts in the posterior frontal lobes bilaterally (series 5, image 49, 46). Chronic infarct in the anterior left frontal lobe with possible cortical laminar necrosis. Sequela of severe chronic microvascular ischemic change. Redemonstrated chronic cerebellar infarctsr bilateraally. no evidence of a parenchymal hematoma. No hydrocephalus. Advanced generalized volume loss. Vascular: Normal flow voids. Skull and upper cervical spine: Normal marrow signal. Sinuses/Orbits: Bilateral lens replacement. No mastoid or middle ear effusion. Paranasal sinuses are generally clear. Other: None. MRI CERVICAL SPINE FINDINGS Alignment: Physiologic. Vertebrae: No fracture, evidence of discitis, or bone lesion. Cord: Normal signal and morphology. Posterior Fossa, vertebral arteries, paraspinal tissues: There is a multiple multinodular thyroid goiter on the left with likely substernal extension. Disc levels: C1-C2: Mild degenerative change. C2-C3: No significant disc bulge. Moderate left and mild right facet degenerative change. Moderate left neural foraminal narrowing. No spinal canal narrowing. C3-C4: Moderate bilateral facet degenerative change. No  significant disc bulge. Ligamentum flavum hypertrophy. Mild spinal canal narrowing. Moderate bilateral neural foraminal narrowing. C4-C5: No significant disc bulge. Moderate to severe bilateral facet degenerative change. Ligamentum flavum hypertrophy. Mild spinal canal narrowing. Moderate bilateral neural foraminal narrowing C5-C6: Moderate bilateral facet degenerative change. Minimal disc bulge. Mild spinal canal narrowing. Mild-to-moderate bilateral neural foraminal narrowing. C6-C7: Mild bilateral facet degenerative change. No significant spinal canal stenosis. No significant disc bulge. No neural foraminal stenosis. C7-T1: Unremarkable. IMPRESSION: 1. Small cortical infarcts in the posterior frontal lobes bilaterally. 2. No acute cervical spine fracture or malalignment. 3. Multilevel neural foraminal narrowing, with moderate neural foraminal narrowing on the left at C2-C3 and bilaterally at C3-C4 and C4-C5. 4. Mild spinal canal narrowing at C3-C4, C4-C5, and C5-C6. 5. 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 These results will be called to the ordering clinician or representative by the Radiologist Assistant, and communication documented in the PACS or [employer]. Electronically Signed   By: Marin Roberts M.D.   On: 02/11/2022 19:02   MR Cervical Spine Wo Contrast  Result Date: 02/11/2022 CLINICAL DATA:  Stroke suspected EXAM: MRI HEAD WITHOUT CONTRAST MRI CERVICAL SPINE WITHOUT CONTRAST TECHNIQUE: Multiplanar, multiecho pulse sequences of the brain and surrounding structures, and cervical spine, to include the craniocervical junction and cervicothoracic junction, were obtained without intravenous contrast. COMPARISON:  MRI Brain 08/19/20 FINDINGS: MRI HEAD FINDINGS Brain:  Small cortical infarcts in the posterior frontal lobes bilaterally (series 5, image 49, 46). Chronic infarct in the anterior left frontal lobe with possible cortical laminar necrosis. Sequela of severe  chronic microvascular ischemic change. Redemonstrated chronic cerebellar infarctsr bilateraally. no evidence of a parenchymal hematoma. No hydrocephalus. Advanced generalized volume loss. Vascular: Normal flow voids. Skull and upper cervical spine: Normal marrow signal. Sinuses/Orbits: Bilateral lens replacement. No mastoid or middle ear effusion. Paranasal sinuses are generally clear. Other: None. MRI CERVICAL SPINE FINDINGS Alignment: Physiologic. Vertebrae: No fracture, evidence of discitis, or bone lesion. Cord: Normal signal and morphology. Posterior Fossa, vertebral arteries, paraspinal tissues: There is a multiple multinodular thyroid goiter on the left with likely substernal extension. Disc levels: C1-C2: Mild degenerative change. C2-C3: No significant disc bulge. Moderate left and mild right facet degenerative change. Moderate left neural foraminal narrowing. No spinal canal narrowing. C3-C4: Moderate bilateral facet degenerative change. No significant disc bulge. Ligamentum flavum hypertrophy. Mild spinal canal narrowing. Moderate bilateral neural foraminal narrowing. C4-C5: No significant disc bulge. Moderate to severe bilateral facet degenerative change. Ligamentum flavum hypertrophy. Mild spinal canal narrowing. Moderate bilateral neural foraminal narrowing C5-C6: Moderate bilateral facet degenerative change. Minimal disc bulge. Mild spinal canal narrowing. Mild-to-moderate bilateral neural foraminal narrowing. C6-C7: Mild bilateral facet degenerative change. No significant spinal canal stenosis. No significant disc bulge. No neural foraminal stenosis. C7-T1: Unremarkable. IMPRESSION: 1. Small cortical infarcts in the posterior frontal lobes bilaterally. 2. No acute cervical spine fracture or malalignment. 3. Multilevel neural foraminal narrowing, with moderate neural foraminal narrowing on the left at C2-C3 and bilaterally at C3-C4 and C4-C5. 4. Mild spinal canal narrowing at C3-C4, C4-C5, and C5-C6.  5. 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 These results will be called to the ordering clinician or representative by the Radiologist Assistant, and communication documented in the PACS or [employer]. Electronically Signed   By: Marin Roberts M.D.   On: 02/11/2022 19:02   CT HEAD WO CONTRAST  Result Date: 02/11/2022 CLINICAL DATA:  Arm and shoulder pain, neurologic deficit EXAM: CT HEAD WITHOUT CONTRAST TECHNIQUE: Contiguous axial images were obtained from the base of the skull through the vertex without intravenous contrast. RADIATION DOSE REDUCTION: This exam was performed according to the departmental dose-optimization program which includes automated exposure control, adjustment of the mA and/or kV according to patient size and/or use of iterative reconstruction technique. COMPARISON:  02/01/2022 FINDINGS: Brain: Stable chronic small-vessel ischemic changes throughout the periventricular white matter and bilateral basal ganglia. No evidence of acute infarct or hemorrhage. Lateral ventricles and remaining midline structures are stable. No acute extra-axial fluid collections. No mass effect. Vascular: No hyperdense vessel or unexpected calcification. Skull: Normal. Negative for fracture or focal lesion. Sinuses/Orbits: No acute finding. Other: None. IMPRESSION: 1. Stable head CT, no acute intracranial process. Electronically Signed   By: Randa Ngo M.D.   On: 02/11/2022 15:01       LOS: 1 day   [facility] Pager on [email or web address]  02/12/2022, 9:50 AM

## 2022-02-12 NOTE — Final Progress Note (Signed)
Pt left the unit against medical advise. Refused to sign AMA. MD aware.

## 2022-02-12 NOTE — Evaluation (Signed)
Occupational Therapy Evaluation Patient Details Name: David Hendricks MRN: [id number] DOB: [birth year] Today's Date: 02/12/2022   History of Present Illness Pt is a 72 y.o. male presenting to [facility] ED 2/3 with L hand tingling and weakness as well as L shoulder pain. Transferred to [facility] for further work up. MRI revealed small cortical infarcts in the posterior frontal lobes bilaterally; multilevel neural foraminal narrowing on L at C2-3 and bil at C3-5. 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 Pt presents with decreased strength, coordination and sensation of BUE (L worse than R) affecting efficiency and safety during ADL and IADL. Pt additionally with decrsaed safety, awareness, and attention. Due to decreased cognition, strength, and coordination, recommending HHOT to optimize safety an independence in ADL and IADL.      Recommendations for follow up therapy are one component of a multi-disciplinary discharge planning process, led by the attending physician.  Recommendations may be updated based on patient status, additional functional criteria and insurance authorization.   Follow Up Recommendations  Home health OT     Assistance Recommended at Discharge PRN  Patient can return home with the following Assistance with cooking/housework;Assist for transportation    Functional Status Assessment  Patient has had a recent decline in their functional status and demonstrates the ability to make significant improvements in function in a reasonable and predictable amount of time.  Equipment Recommendations  None recommended by OT    Recommendations for Other Services       Precautions / Restrictions Precautions Precautions: Fall      Mobility Bed Mobility               General bed mobility comments: up in chair     Transfers Overall transfer level: Modified independent Equipment used: None               General transfer comment: no imbalance and denied dizziness      Balance                                 Standardized Balance Assessment Standardized Balance Assessment : Berg Balance Test, Dynamic Gait Index Berg Balance Test Sit to Stand: Able to stand without using hands and stabilize independently Standing Unsupported: Able to stand safely 2 minutes Sitting with Back Unsupported but Feet Supported on Floor or Stool: Able to sit safely and securely 2 minutes Stand to Sit: Sits safely with minimal use of hands Transfers: Able to transfer safely, minor use of hands Standing Unsupported with Eyes Closed: Able to stand 10 seconds safely Standing Ubsupported with Feet Together: Able to place feet together independently and stand 1 minute safely From Standing, Reach Forward with Outstretched Arm: Can reach forward >12 cm safely (5") From Standing Position, Pick up Object from Floor: Unable to pick up shoe, but reaches 2-5 cm (1-2") from shoe and balances independently From Standing Position, Turn to Look Behind Over each Shoulder: Looks behind from both sides and weight shifts well Turn 360 Degrees: Able to turn 360 degrees safely in 4 seconds or less Standing Unsupported, Alternately Place Feet on Step/Stool: Needs assistance to keep from falling or unable to try Standing Unsupported, One Foot in Front: Able to take small step independently and hold 30 seconds Standing on One  Leg: Tries to lift leg/unable to hold 3 seconds but remains standing independently Total Score: 44 Dynamic Gait Index Level Surface: Mild Impairment Change in Gait Speed: Normal Gait with Horizontal Head Turns: Mild Impairment Gait with Vertical Head Turns: Mild Impairment Gait and Pivot Turn: Normal Step Over Obstacle: Mild Impairment Step Around Obstacles: Mild Impairment Steps: Mild  Impairment Total Score: 18     ADL either performed or assessed with clinical judgement   ADL Overall ADL's : Needs assistance/impaired Eating/Feeding: Set up;Sitting   Grooming: Standing;Modified independent   Upper Body Bathing: Modified independent;Sitting   Lower Body Bathing: Sit to/from stand;Modified independent   Upper Body Dressing : Modified independent;Sitting   Lower Body Dressing: Supervision/safety;Sit to/from stand   Toilet Transfer: Ambulation;Modified Independent   Toileting- Clothing Manipulation and Hygiene: Modified independent;Sitting/lateral lean   Tub/ Shower Transfer: Supervision/safety;Tub transfer;Ambulation   Functional mobility during ADLs: Supervision/safety;Modified independent General ADL Comments: supervision for safety     Vision Ability to See in Adequate Light: 0 Adequate Patient Visual Report: No change from baseline Vision Assessment?: No apparent visual deficits     Perception Perception Perception Tested?: No   Praxis Praxis Praxis tested?: Not tested    Pertinent Vitals/Pain       Hand Dominance Left   Extremity/Trunk Assessment Upper Extremity Assessment Upper Extremity Assessment: Generalized weakness;LUE deficits/detail LUE Deficits / Details: decreased ability to perform thump to finger opposition, decreased strength as compared to R LUE Sensation: decreased proprioception LUE Coordination: decreased fine motor;decreased gross motor   Lower Extremity Assessment Lower Extremity Assessment: Defer to PT evaluation   Cervical / Trunk Assessment Cervical / Trunk Assessment: Kyphotic   Communication Communication Communication: No difficulties   Cognition Arousal/Alertness: Awake/alert Behavior During Therapy: WFL for tasks assessed/performed Overall Cognitive Status: Impaired/Different from baseline Area of Impairment: Attention, Awareness, Problem solving                   Current Attention Level:  Selective       Awareness: Emergent Problem Solving: Requires verbal cues General Comments: Pt with decreased awareness of current level of function but able to recognize mistakes when making them during cognitive testing and able to recognize impaiments during physical testing as well. Pt with incresed time for following commands and problem solving. Able to sustain attention and demo selective attention (cognitive testing with television on), but unable to divide or easily alternate attention (put condiments on food while participating in basic cognitive testing)     General Comments  VSS    Exercises     Shoulder Instructions      Home Living Family/patient expects to be discharged to:: Private residence Living Arrangements: Alone Available Help at Discharge: Friend(s);Available PRN/intermittently Type of Home: Apartment Home Access: Level entry     Home Layout: One level     Bathroom Shower/Tub: Teacher, early years/pre: Standard Bathroom Accessibility: Yes   Home Equipment: Cane - single point          Prior Functioning/Environment Prior Level of Function : Independent/Modified Independent             Mobility Comments: not using cane          OT Problem List: Decreased strength;Decreased activity tolerance;Impaired balance (sitting and/or standing);Decreased coordination;Decreased safety awareness;Decreased cognition;Impaired sensation      OT Treatment/Interventions: Therapeutic exercise;Self-care/ADL training;DME and/or AE instruction;Balance training;Patient/family education;Cognitive remediation/compensation;Therapeutic activities    OT Goals(Current goals can be found in the care plan section) Acute Rehab OT  Goals Patient Stated Goal: go home today OT Goal Formulation: With patient Time For Goal Achievement: 02/26/22 Potential to Achieve Goals: Good  OT Frequency: Min 2X/week    Co-evaluation              AM-PAC OT "6 Clicks"  Daily Activity     Outcome Measure Help from another person eating meals?: None Help from another person taking care of personal grooming?: None Help from another person toileting, which includes using toliet, bedpan, or urinal?: None Help from another person bathing (including washing, rinsing, drying)?: None Help from another person to put on and taking off regular upper body clothing?: None Help from another person to put on and taking off regular lower body clothing?: None 6 Click Score: 24   End of Session Equipment Utilized During Treatment: Gait belt Nurse Communication: Mobility status  Activity Tolerance: Patient tolerated treatment well Patient left: in chair;with call bell/phone within reach  OT Visit Diagnosis: Unsteadiness on feet (R26.81);Muscle weakness (generalized) (M62.81);Other abnormalities of gait and mobility (R26.89);Other symptoms and signs involving cognitive function                Time: 5790-3833 OT Time Calculation (min): 22 min Charges:  OT General Charges $OT Visit: 1 Visit OT Evaluation $OT Eval Low Complexity: 1 Low  Elder Cyphers, OTR/L [facility] Acute Rehabilitation Office: [phone]   Magnus Ivan 02/12/2022, 2:33 PM

## 2022-02-12 NOTE — Discharge Summary (Signed)
[facility]  Physician Discharge Summary   Patient ID: David Hendricks MRN: [id number] DOB/AGE: 73-Apr-1952 73 y.o.  Admit date: 02/11/2022 Discharge date: 02/12/2022    PCP: Wardell Honour, MD  DISCHARGE DIAGNOSES:    Stroke [facility])   Benign prostatic hyperplasia with urinary frequency   Tobacco abuse   Hyperlipidemia   CAD (coronary artery disease)   Anxiety and depression   Essential hypertension   PAF (paroxysmal atrial fibrillation) ([place])   Ischemic cerebrovascular accident (CVA) ([facility])   GERD (gastroesophageal reflux disease)  PATIENT LEFT AGAINST MEDICAL ADVISE   INITIAL HISTORY:  72 y.o. male with medical history significant of CAD, CVA with no residual symptoms, PAF, BPH, depression and anxiety, COPD, tobacco abuse and hyperlipidemia presented to the emergency department complaining of left arm weakness primarily involving the hand.  Did not have any weakness in the lower extremities.  He had been noncompliant with his home medications including Eliquis.  Found to have an acute stroke.  Hospitalized for further management.    Consultants: Neurology   Procedures: Echocardiogram   HOSPITAL COURSE:   Acute stroke Patient presented with weakness of the left upper extremity.  Found to have acute stroke on MRI. Patient has been noncompliant with Eliquis.  Importance of compliance emphasized to the patient.  Started back on Eliquis by neurology. LDL 92.  Started back on statin. HbA1c 5.2.  TSH 0.757.  Urine drug screen negative.    Essential hypertension    Paroxysmal atrial fibrillation Emphasized compliance to the patient.  Started back on Eliquis.     Tobacco abuse Tobacco cessation discussed.  He smokes up to 2 packs of cigarettes on a daily basis.    Dyslipidemia Continue with statin.   History of coronary artery disease Stable.   History of anxiety and depression Stable.  Continue Celexa and trazodone   Enlarged thyroid/hypothyroidism Incidentally  noted on CT.  Patient has previous history of same.  He has been diagnosed with hyperparathyroidism previously.  Supposed to be on methimazole once a day.  This can be resumed. He had a ultrasound of his thyroid back in 2020.  Thyroid nodules were noted.  Looks like he underwent biopsy in 2021 no malignancy was noted. No further workup at this time.  Will defer to outpatient providers.   BPH Stable.   Transportation needs TOC consulted.   PATIENT LEFT AGAINST MEDICAL ADVISE  PERTINENT LABS:  The results of significant diagnostics from this hospitalization (including imaging, microbiology, ancillary and laboratory) are listed below for reference.     Labs:   Basic Metabolic Panel: Recent Labs  Lab 02/11/22 1459 02/12/22 0251  NA 139 138  K 4.2 3.8  CL 104 104  CO2 27 30  GLUCOSE 98 95  BUN 11 9  CREATININE 1.05 1.20  CALCIUM 8.5* 8.7*   Liver Function Tests: Recent Labs  Lab 02/11/22 1459 02/12/22 0251  AST 18 15  ALT 13 12  ALKPHOS 84 81  BILITOT 1.4* 1.3*  PROT 5.9* 5.3*  ALBUMIN 3.5 3.1*    CBC: Recent Labs  Lab 02/11/22 1459 02/12/22 0251  WBC 6.9 8.0  NEUTROABS 4.1 4.4  HGB 17.1* 16.6  HCT 51.9 49.9  MCV 86.1 86.9  PLT 127* 122*     IMAGING STUDIES ECHOCARDIOGRAM COMPLETE  Result Date: 02/12/2022    ECHOCARDIOGRAM REPORT   Patient Name:   David Hendricks Date of Exam: 02/12/2022 Medical Rec #:  [id number]  Height:       73.0 in  Accession #:    [id number] Weight:       198.0 lb Date of Birth:  January 22, 1950  BSA:          2.142 m Patient Age:    36 years   BP:           134/70 mmHg Patient Gender: M          HR:           78 bpm. Exam Location:  Inpatient Procedure: 2D Echo, Cardiac Doppler, Color Doppler, 3D Echo and Strain Analysis Indications:    I63.9  History:        Patient has prior history of Echocardiogram examinations, most                 recent 01/12/2021. CAD and Previous Myocardial Infarction, Stroke                 and COPD, Arrythmias:Atrial  Fibrillation; Risk                 Factors:Hypertension, Dyslipidemia and Current Smoker.  Sonographer:    Wilkie Aye RVT RCS Referring Phys: [id number] EDWIN SILVA ZAPATA  Sonographer Comments: Suboptimal subcostal window. IMPRESSIONS  1. Left ventricular ejection fraction, by estimation, is 50 to 55%. Left ventricular ejection fraction by 3D volume is 52 %. The left ventricle has low normal function. The left ventricle has no regional wall motion abnormalities. There is mild left ventricular hypertrophy. Left ventricular diastolic function could not be evaluated.  2. Right ventricular systolic function is normal. The right ventricular size is normal.  3. Left atrial size was moderately dilated.  4. The mitral valve is abnormal. Mild to moderate mitral valve regurgitation.  5. The aortic valve is tricuspid. Aortic valve regurgitation is trivial. Aortic valve sclerosis/calcification is present, without any evidence of aortic stenosis. Comparison(s): 01/12/2021: EF 40-45%. FINDINGS  Left Ventricle: Left ventricular ejection fraction, by estimation, is 50 to 55%. Left ventricular ejection fraction by 3D volume is 52 %. The left ventricle has low normal function. The left ventricle has no regional wall motion abnormalities. The left ventricular internal cavity size was normal in size. There is mild left ventricular hypertrophy. Left ventricular diastolic function could not be evaluated due to atrial fibrillation. Left ventricular diastolic function could not be evaluated. Right Ventricle: The right ventricular size is normal. No increase in right ventricular wall thickness. Right ventricular systolic function is normal. Left Atrium: Left atrial size was moderately dilated. Right Atrium: Right atrial size was normal in size. Pericardium: There is no evidence of pericardial effusion. Mitral Valve: The mitral valve is abnormal. Mild mitral annular calcification. Mild to moderate mitral valve regurgitation. Tricuspid Valve:  The tricuspid valve is grossly normal. Tricuspid valve regurgitation is mild. Aortic Valve: The aortic valve is tricuspid. Aortic valve regurgitation is trivial. Aortic valve sclerosis/calcification is present, without any evidence of aortic stenosis. Aortic valve mean gradient measures 2.0 mmHg. Aortic valve peak gradient measures 3.6 mmHg. Aortic valve area, by VTI measures 2.55 cm. Pulmonic Valve: The pulmonic valve was normal in structure. Pulmonic valve regurgitation is not visualized. Aorta: The aortic root and ascending aorta are structurally normal, with no evidence of dilitation. Venous: The inferior vena cava was not well visualized. IAS/Shunts: No atrial level shunt detected by color flow Doppler.  LEFT VENTRICLE PLAX 2D LVIDd:         4.30 cm         Diastology LVIDs:  3.70 cm         LV e' medial:    5.48 cm/s LV PW:         1.30 cm         LV E/e' medial:  19.0 LV IVS:        1.30 cm         LV e' lateral:   8.20 cm/s LVOT diam:     2.20 cm         LV E/e' lateral: 12.7 LV SV:         44 LV SV Index:   20 LVOT Area:     3.80 cm        3D Volume EF                                LV 3D EF:    Left                                             ventricul                                             ar                                             ejection                                             fraction                                             by 3D                                             volume is                                             52 %.                                 3D Volume EF:                                3D EF:        52 %                                LV EDV:       118 ml  LV ESV:       57 ml                                LV SV:        61 ml RIGHT VENTRICLE RV Basal diam:  3.90 cm RV Mid diam:    2.20 cm RV S prime:     5.52 cm/s TAPSE (M-mode): 1.7 cm LEFT ATRIUM             Index        RIGHT ATRIUM           Index LA diam:        4.00 cm  1.87 cm/m   RA Area:     16.50 cm LA Vol (A2C):   96.8 ml 45.18 ml/m  RA Volume:   39.70 ml  18.53 ml/m LA Vol (A4C):   74.2 ml 34.63 ml/m LA Biplane Vol: 92.7 ml 43.27 ml/m  AORTIC VALVE                    PULMONIC VALVE AV Area (Vmax):    2.67 cm     PV Vmax:       0.64 m/s AV Area (Vmean):   2.43 cm     PV Peak grad:  1.6 mmHg AV Area (VTI):     2.55 cm AV Vmax:           95.27 cm/s AV Vmean:          69.700 cm/s AV VTI:            0.172 m AV Peak Grad:      3.6 mmHg AV Mean Grad:      2.0 mmHg LVOT Vmax:         66.83 cm/s LVOT Vmean:        44.633 cm/s LVOT VTI:          0.115 m LVOT/AV VTI ratio: 0.67 AR Vena Contracta: 0.30 cm  AORTA Ao Root diam: 3.40 cm Ao Arch diam: 3.4 cm MITRAL VALVE                TRICUSPID VALVE MV Area (PHT): 3.76 cm     TR Peak grad:   27.7 mmHg MV Decel Time: 202 msec     TR Mean grad:   20.0 mmHg MR Peak grad: 89.9 mmHg     TR Vmax:        263.00 cm/s MR Mean grad: 61.0 mmHg     TR Vmean:       214.0 cm/s MR Vmax:      474.00 cm/s MR Vmean:     371.0 cm/s    SHUNTS MV E velocity: 104.06 cm/s  Systemic VTI:  0.12 m                             Systemic Diam: 2.20 cm Lyman Bishop MD Electronically signed by Lyman Bishop MD Signature Date/Time: 02/12/2022/3:47:12 PM    Final    CT ANGIO HEAD NECK W WO CM  Result Date: 02/11/2022 CLINICAL DATA:  Left hand tingling and weakness EXAM: CT ANGIOGRAPHY HEAD AND NECK TECHNIQUE: Multidetector CT imaging of the head and neck was performed using the standard protocol during bolus administration of intravenous contrast. Multiplanar CT image reconstructions and MIPs were obtained to evaluate the vascular anatomy. Carotid  stenosis measurements (when applicable) are obtained utilizing NASCET criteria, using the distal internal carotid diameter as the denominator. RADIATION DOSE REDUCTION: This exam was performed according to the departmental dose-optimization program which includes automated exposure control, adjustment of the mA and/or  kV according to patient size and/or use of iterative reconstruction technique. CONTRAST:  33m OMNIPAQUE IOHEXOL 350 MG/ML SOLN COMPARISON:  None Available. FINDINGS: CTA NECK FINDINGS SKELETON: There is no bony spinal canal stenosis. No lytic or blastic lesion. OTHER NECK: There is a large multinodular goiter, left asymmetric with a large component extending into the upper mediastinum. UPPER CHEST: Biapical emphysema. AORTIC ARCH: There is calcific atherosclerosis of the aortic arch. There is no aneurysm, dissection or hemodynamically significant stenosis of the visualized portion of the aorta. Conventional 3 vessel aortic branching pattern. The visualized proximal subclavian arteries are widely patent. RIGHT CAROTID SYSTEM: Normal without aneurysm, dissection or stenosis. LEFT CAROTID SYSTEM: Normal without aneurysm, dissection or stenosis. VERTEBRAL ARTERIES: Left dominant configuration. Both origins are clearly patent. There is no dissection, occlusion or flow-limiting stenosis to the skull base (V1-V3 segments). CTA HEAD FINDINGS POSTERIOR CIRCULATION: --Vertebral arteries: Normal V4 segments. --Inferior cerebellar arteries: Normal. --Basilar artery: Normal. --Superior cerebellar arteries: Normal. --Posterior cerebral arteries (PCA): Normal. ANTERIOR CIRCULATION: --Intracranial internal carotid arteries: Normal. --Anterior cerebral arteries (ACA): Normal. Both A1 segments are present. Patent anterior communicating artery (a-comm). --Middle cerebral arteries (MCA): Normal. VENOUS SINUSES: As permitted by contrast timing, patent. ANATOMIC VARIANTS: Fetal origins of both posterior cerebral arteries. Review of the MIP images confirms the above findings. IMPRESSION: 1. No emergent large vessel occlusion or high-grade stenosis of the intracranial arteries. 2. Incidental heterogeneous and enlarged thyroid. Recommend non-emergent thyroid ultrasound. Reference: J Am Coll Radiol. 2015 Feb;12(2): 143-50 Aortic  Atherosclerosis (ICD10-I70.0) and Emphysema (ICD10-J43.9). Electronically Signed   By: KUlyses JarredM.D.   On: 02/11/2022 20:59   MR BRAIN WO CONTRAST  Result Date: 02/11/2022 CLINICAL DATA:  Stroke suspected EXAM: MRI HEAD WITHOUT CONTRAST MRI CERVICAL SPINE WITHOUT CONTRAST TECHNIQUE: Multiplanar, multiecho pulse sequences of the brain and surrounding structures, and cervical spine, to include the craniocervical junction and cervicothoracic junction, were obtained without intravenous contrast. COMPARISON:  MRI Brain 08/19/20 FINDINGS: MRI HEAD FINDINGS Brain: Small cortical infarcts in the posterior frontal lobes bilaterally (series 5, image 49, 46). Chronic infarct in the anterior left frontal lobe with possible cortical laminar necrosis. Sequela of severe chronic microvascular ischemic change. Redemonstrated chronic cerebellar infarctsr bilateraally. no evidence of a parenchymal hematoma. No hydrocephalus. Advanced generalized volume loss. Vascular: Normal flow voids. Skull and upper cervical spine: Normal marrow signal. Sinuses/Orbits: Bilateral lens replacement. No mastoid or middle ear effusion. Paranasal sinuses are generally clear. Other: None. MRI CERVICAL SPINE FINDINGS Alignment: Physiologic. Vertebrae: No fracture, evidence of discitis, or bone lesion. Cord: Normal signal and morphology. Posterior Fossa, vertebral arteries, paraspinal tissues: There is a multiple multinodular thyroid goiter on the left with likely substernal extension. Disc levels: C1-C2: Mild degenerative change. C2-C3: No significant disc bulge. Moderate left and mild right facet degenerative change. Moderate left neural foraminal narrowing. No spinal canal narrowing. C3-C4: Moderate bilateral facet degenerative change. No significant disc bulge. Ligamentum flavum hypertrophy. Mild spinal canal narrowing. Moderate bilateral neural foraminal narrowing. C4-C5: No significant disc bulge. Moderate to severe bilateral facet degenerative  change. Ligamentum flavum hypertrophy. Mild spinal canal narrowing. Moderate bilateral neural foraminal narrowing C5-C6: Moderate bilateral facet degenerative change. Minimal disc bulge. Mild spinal canal narrowing. Mild-to-moderate bilateral neural foraminal narrowing. C6-C7: Mild bilateral facet degenerative change.  No significant spinal canal stenosis. No significant disc bulge. No neural foraminal stenosis. C7-T1: Unremarkable. IMPRESSION: 1. Small cortical infarcts in the posterior frontal lobes bilaterally. 2. No acute cervical spine fracture or malalignment. 3. Multilevel neural foraminal narrowing, with moderate neural foraminal narrowing on the left at C2-C3 and bilaterally at C3-C4 and C4-C5. 4. Mild spinal canal narrowing at C3-C4, C4-C5, and C5-C6. 5. 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 These results will be called to the ordering clinician or representative by the Radiologist Assistant, and communication documented in the PACS or [employer]. Electronically Signed   By: Marin Roberts M.D.   On: 02/11/2022 19:02   MR Cervical Spine Wo Contrast  Result Date: 02/11/2022 CLINICAL DATA:  Stroke suspected EXAM: MRI HEAD WITHOUT CONTRAST MRI CERVICAL SPINE WITHOUT CONTRAST TECHNIQUE: Multiplanar, multiecho pulse sequences of the brain and surrounding structures, and cervical spine, to include the craniocervical junction and cervicothoracic junction, were obtained without intravenous contrast. COMPARISON:  MRI Brain 08/19/20 FINDINGS: MRI HEAD FINDINGS Brain: Small cortical infarcts in the posterior frontal lobes bilaterally (series 5, image 49, 46). Chronic infarct in the anterior left frontal lobe with possible cortical laminar necrosis. Sequela of severe chronic microvascular ischemic change. Redemonstrated chronic cerebellar infarctsr bilateraally. no evidence of a parenchymal hematoma. No hydrocephalus. Advanced generalized volume loss. Vascular: Normal flow  voids. Skull and upper cervical spine: Normal marrow signal. Sinuses/Orbits: Bilateral lens replacement. No mastoid or middle ear effusion. Paranasal sinuses are generally clear. Other: None. MRI CERVICAL SPINE FINDINGS Alignment: Physiologic. Vertebrae: No fracture, evidence of discitis, or bone lesion. Cord: Normal signal and morphology. Posterior Fossa, vertebral arteries, paraspinal tissues: There is a multiple multinodular thyroid goiter on the left with likely substernal extension. Disc levels: C1-C2: Mild degenerative change. C2-C3: No significant disc bulge. Moderate left and mild right facet degenerative change. Moderate left neural foraminal narrowing. No spinal canal narrowing. C3-C4: Moderate bilateral facet degenerative change. No significant disc bulge. Ligamentum flavum hypertrophy. Mild spinal canal narrowing. Moderate bilateral neural foraminal narrowing. C4-C5: No significant disc bulge. Moderate to severe bilateral facet degenerative change. Ligamentum flavum hypertrophy. Mild spinal canal narrowing. Moderate bilateral neural foraminal narrowing C5-C6: Moderate bilateral facet degenerative change. Minimal disc bulge. Mild spinal canal narrowing. Mild-to-moderate bilateral neural foraminal narrowing. C6-C7: Mild bilateral facet degenerative change. No significant spinal canal stenosis. No significant disc bulge. No neural foraminal stenosis. C7-T1: Unremarkable. IMPRESSION: 1. Small cortical infarcts in the posterior frontal lobes bilaterally. 2. No acute cervical spine fracture or malalignment. 3. Multilevel neural foraminal narrowing, with moderate neural foraminal narrowing on the left at C2-C3 and bilaterally at C3-C4 and C4-C5. 4. Mild spinal canal narrowing at C3-C4, C4-C5, and C5-C6. 5. 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 These results will be called to the ordering clinician or representative by the Radiologist Assistant, and communication documented in  the PACS or [employer]. Electronically Signed   By: Marin Roberts M.D.   On: 02/11/2022 19:02   CT HEAD WO CONTRAST  Result Date: 02/11/2022 CLINICAL DATA:  Arm and shoulder pain, neurologic deficit EXAM: CT HEAD WITHOUT CONTRAST TECHNIQUE: Contiguous axial images were obtained from the base of the skull through the vertex without intravenous contrast. RADIATION DOSE REDUCTION: This exam was performed according to the departmental dose-optimization program which includes automated exposure control, adjustment of the mA and/or kV according to patient size and/or use of iterative reconstruction technique. COMPARISON:  02/01/2022 FINDINGS: Brain: Stable chronic small-vessel ischemic changes throughout the periventricular  white matter and bilateral basal ganglia. No evidence of acute infarct or hemorrhage. Lateral ventricles and remaining midline structures are stable. No acute extra-axial fluid collections. No mass effect. Vascular: No hyperdense vessel or unexpected calcification. Skull: Normal. Negative for fracture or focal lesion. Sinuses/Orbits: No acute finding. Other: None. IMPRESSION: 1. Stable head CT, no acute intracranial process. Electronically Signed   By: Randa Ngo M.D.   On: 02/11/2022 15:01   CT Head Wo Contrast  Result Date: 02/01/2022 CLINICAL DATA:  New onset headache EXAM: CT HEAD WITHOUT CONTRAST TECHNIQUE: Contiguous axial images were obtained from the base of the skull through the vertex without intravenous contrast. RADIATION DOSE REDUCTION: This exam was performed according to the departmental dose-optimization program which includes automated exposure control, adjustment of the mA and/or kV according to patient size and/or use of iterative reconstruction technique. COMPARISON:  CT head 09/07/2021 MRI brain 01/12/2021 FINDINGS: Brain: There is a small area of cortical and deep white matter hypodensity in the left frontal lobe coronal image 5/26 which was not seen on the prior  exam which may represent acute or subacute infarct. There is no evidence for acute hemorrhage, hydrocephalus, extra-axial fluid collection or mass effect. Again seen is mild diffuse atrophy and moderate periventricular and deep white matter hypodensity, likely chronic small vessel ischemic change. Vascular: No hyperdense vessel or unexpected calcification. Skull: Normal. Negative for fracture or focal lesion. Sinuses/Orbits: No acute finding. Other: None. IMPRESSION: 1. Small area of cortical and deep white matter hypodensity in the left frontal lobe which was not seen on the prior exam. This may represent acute or subacute infarct. 2. Stable atrophy and chronic small vessel ischemic change. Electronically Signed   By: Ronney Asters M.D.   On: 02/01/2022 20:29   DG Chest 2 View  Result Date: 02/01/2022 CLINICAL DATA:  Chest pain EXAM: CHEST - 2 VIEW COMPARISON:  09/07/2021 FINDINGS: Lungs are clear. No pneumothorax or pleural effusion. Cardiac size within normal limits. Pulmonary vascularity is normal. Implanted loop recorder noted. No acute bone abnormality. IMPRESSION: 1. No active cardiopulmonary disease. Electronically Signed   By: Fidela Salisbury M.D.   On: 02/01/2022 20:16    PATIENT LEFT AGAINST MEDICAL ADVISE   Bonnielee Haff  [facility] Pager on [email or web address]  02/13/2022, 10:49 AM

## 2022-02-12 NOTE — Consult Note (Signed)
Neurology Consultation  Reason for Consult: Acute embolic strokes Referring Physician: Dr. Maryland Pink  CC: left arm weakness and numbness  History is obtained from: Patient and chart  HPI: [place] is a 72 y.o. male with history of CAD, stroke with no residual symptoms, atrial fibrillation on Eliquis, BPH, depression, anxiety, COPD, hyperlipidemia and tobacco abuse who presents with left arm weakness.  He stated he was last normal when he went to sleep on 2/2 and woke up with complete numbness and incoordination of the left hand.  He smokes about 2 packs/day but is willing to quit in the near future.  Patient has not taken his Eliquis in about a month due to frustration with his medical care.  CT head showed no acute abnormalities, however MRI brain showed cortical infarcts in bilateral posterior frontal lobes.     LKW: 2200 on 2/2 TNK given?: no, outside of window IR Thrombectomy? No, no LVO Modified Rankin Scale: 1-No significant post stroke disability and can perform usual duties with stroke symptoms  ROS: A complete ROS was performed and is negative except as noted in the HPI.   Past Medical History:  Diagnosis Date   Aortic atherosclerosis (HCC)    Arthritis    Benign prostatic hyperplasia (BPH) with urinary urgency    COPD (chronic obstructive pulmonary disease) ([place])    Coronary artery disease    10/18 PCI/DES to pLAD, mild nonobstructive disease in the Lcx/RCA. Normal EF.    Depression with anxiety    Dyslipidemia    Dysrhythmia 2016   irregular heartbeat   Gallstones    GERD (gastroesophageal reflux disease)    History of kidney stones    Hyperthyroidism    Leg cramps    Multiple pulmonary nodules    Myocardial infarction ([place])    2018   Pneumonia    Seasonal allergies    Stroke ([place])    Vitamin B 12 deficiency      Family History  Problem Relation Age of Onset   Alcohol abuse Father        called [facility] per pt   Cancer Father        [facility]  CANCER PER PT NON SURGICAL    Colon cancer Neg Hx    Colon polyps Neg Hx    Esophageal cancer Neg Hx    Rectal cancer Neg Hx    Stomach cancer Neg Hx      Social History:   reports that he has been smoking cigarettes. He started smoking about 61 years ago. He has a 90.00 pack-year smoking history. He has never used smokeless tobacco. He reports that he does not currently use alcohol. He reports that he does not use drugs.  Medications  Current Facility-Administered Medications:    [COMPLETED] sodium chloride 0.9 % bolus 500 mL, 500 mL, Intravenous, Once, Stopped at 02/11/22 1605 **FOLLOWED BY** 0.9 %  sodium chloride infusion, 100 mL/hr, Intravenous, Continuous, Patrecia Pour, Christean Grief, MD, Last Rate: 100 mL/hr at 02/11/22 1502, 100 mL/hr at 02/11/22 1502   acetaminophen (TYLENOL) tablet 650 mg, 650 mg, Oral, Q4H PRN **OR** acetaminophen (TYLENOL) 160 MG/5ML solution 650 mg, 650 mg, Per Tube, Q4H PRN **OR** acetaminophen (TYLENOL) suppository 650 mg, 650 mg, Rectal, Q4H PRN, Patrecia Pour, Christean Grief, MD   apixaban Arne [place]) tablet 5 mg, 5 mg, Oral, BID, Patrecia Pour, Christean Grief, MD   aspirin tablet 325 mg, 325 mg, Oral, Daily, Patrecia Pour, Christean Grief, MD, 325 mg at 02/12/22 0931   atorvastatin (LIPITOR)  tablet 80 mg, 80 mg, Oral, Daily, Patrecia Pour, Christean Grief, MD, 80 mg at 02/12/22 0931   citalopram (CELEXA) tablet 20 mg, 20 mg, Oral, Daily, Patrecia Pour, Christean Grief, MD, 20 mg at 02/12/22 0272   methimazole (TAPAZOLE) tablet 5 mg, 5 mg, Oral, Daily, Bonnielee Haff, MD   nicotine (NICODERM CQ - dosed in mg/24 hours) patch 21 mg, 21 mg, Transdermal, Once, Isla Pence, MD, 21 mg at 02/11/22 2017   nicotine (NICODERM CQ - dosed in mg/24 hours) patch 21 mg, 21 mg, Transdermal, Q0600, Patrecia Pour, Christean Grief, MD, 21 mg at 02/12/22 0653   pantoprazole (PROTONIX) EC tablet 20 mg, 20 mg, Oral, Daily, Patrecia Pour, Christean Grief, MD, 20 mg at 02/12/22 0931   senna-docusate (Senokot-S) tablet 1 tablet, 1 tablet, Oral, QHS PRN, Patrecia Pour, Christean Grief, MD   traZODone (DESYREL) tablet 100 mg, 100 mg, Oral, QHS, Patrecia Pour, Christean Grief, MD   Exam: Current vital signs: BP 109/77 (BP Location: Left Arm)   Pulse 96   Temp 97.6 F (36.4 C) (Axillary)   Resp 18   SpO2 99%  Vital signs in last 24 hours: Temp:  [97.6 F (36.4 C)-99.2 F (37.3 C)] 97.6 F (36.4 C) (02/04 0900) Pulse Rate:  [63-99] 96 (02/04 0900) Resp:  [16-18] 18 (02/04 0900) BP: (86-125)/(52-100) 109/77 (02/04 0900) SpO2:  [89 %-99 %] 99 % (02/04 0900)  GENERAL: Awake, alert, in no acute distress Psych: Affect appropriate for situation, patient is calm and cooperative with examination Head: Normocephalic and atraumatic, without obvious abnormality EENT: Normal conjunctivae, moist mucous membranes, no OP obstruction LUNGS: Normal respiratory effort. Non-labored breathing on room air, reductive cough noted Extremities: warm, well perfused, without obvious deformity  NEURO:  Mental Status: Awake, alert, and oriented to person, place, time, and situation. He is able to provide a clear and coherent history of present illness. Speech/Language: speech is clear and fluent.   No neglect is noted Cranial Nerves:  II: PERRL III, IV, VI: EOMI. Lid elevation symmetric and full.  V: Sensation is intact to light touch and symmetrical to face.  VII: Face is symmetric resting and smiling.  VIII: Hearing intact to voice IX, X: Phonation normal.  XI: Normal sternocleidomastoid and trapezius muscle strength XII: Tongue protrudes midline without fasciculations.   Motor: 5/5 strength is all muscle groups.  Tone is normal. Bulk is normal.  Sensation: Intact to light touch bilaterally in all four extremities. No extinction to DSS present.  Coordination: FTN intact bilaterally but slower on the left.  Subtle drift in left arm Gait: Deferred  NIHSS: 1a Level of Conscious.: 0 1b LOC Questions: 0 1c LOC Commands: 0 2 Best Gaze: 0 3 Visual: 0 4 Facial Palsy: 0 5a  Motor Arm - left: 1 5b Motor Arm - Right: 0 6a Motor Leg - Left: 0 6b Motor Leg - Right: 0 7 Limb Ataxia: 0 8 Sensory: 0 9 Best Language: 0 10 Dysarthria: 0 11 Extinct. and Inatten.: 0 TOTAL: 1   Labs I have reviewed labs in epic and the results pertinent to this consultation are:   CBC    Component Value Date/Time   WBC 8.0 02/12/2022 0251   RBC 5.74 02/12/2022 0251   HGB 16.6 02/12/2022 0251   HCT 49.9 02/12/2022 0251   PLT 122 (L) 02/12/2022 0251   MCV 86.9 02/12/2022 0251   MCH 28.9 02/12/2022 0251   MCHC 33.3 02/12/2022 0251   RDW 13.5 02/12/2022 0251   LYMPHSABS 2.8 02/12/2022 0251   MONOABS  0.5 02/12/2022 0251   EOSABS 0.2 02/12/2022 0251   BASOSABS 0.1 02/12/2022 0251    CMP     Component Value Date/Time   NA 138 02/12/2022 0251   K 3.8 02/12/2022 0251   CL 104 02/12/2022 0251   CO2 30 02/12/2022 0251   GLUCOSE 95 02/12/2022 0251   BUN 9 02/12/2022 0251   CREATININE 1.20 02/12/2022 0251   CREATININE 1.14 04/13/2021 1441   CALCIUM 8.7 (L) 02/12/2022 0251   PROT 5.3 (L) 02/12/2022 0251   PROT 6.3 12/04/2016 1029   ALBUMIN 3.1 (L) 02/12/2022 0251   ALBUMIN 4.0 12/04/2016 1029   AST 15 02/12/2022 0251   ALT 12 02/12/2022 0251   ALKPHOS 81 02/12/2022 0251   BILITOT 1.3 (H) 02/12/2022 0251   BILITOT 0.6 12/04/2016 1029   GFRNONAA >60 02/12/2022 0251   GFRAA 51 (L) 06/11/2019 2325    Lipid Panel     Component Value Date/Time   CHOL 149 02/12/2022 0251   CHOL 108 08/12/2020 1557   TRIG 125 02/12/2022 0251   HDL 32 (L) 02/12/2022 0251   HDL 37 (L) 08/12/2020 1557   CHOLHDL 4.7 02/12/2022 0251   VLDL 25 02/12/2022 0251   LDLCALC 92 02/12/2022 0251   LDLCALC 49 08/12/2020 1557   LDLDIRECT 89.3 01/12/2021 0900   A1c 5.2  Imaging I have reviewed the images obtained:  CT-scan of the brain: No acute abnormality  MRI examination of the brain: Small cortical infarcts in bilateral posterior frontal lobes  Assessment: 72 year old patient with the  above past medical history presents with weakness of the left hand.  He awoke on 2/3 with complete numbness and incoordination of the left hand, however numbness has improved but slight incoordination persists.  He was found on MRI to have acute embolic strokes and bilateral posterior frontal lobes.  He had not taken his Eliquis in about a month, so embolic etiology of the strokes is likely.  He does smoke 2 packs/day and was counseled to stop this.  He is willing to quit.  Patient verbalizes that he has no vehicle and sometimes has difficulty obtaining transportation to doctors appointments.  He also appears to have a depressed mood and states that he does not have many friends or family in the area and that lack of transportation keeps him from being able to get out and do enjoyable activities.  TOC referral placed for help with transportation and hopeful referral to a senior center or community center.  Impression: Embolic acute ischemic strokes in patient with atrial fibrillation after being off Eliquis for a month  Recommendations: - Permissive HTN x48 hrs from sx onset or until stroke ruled out by MRI goal BP <220/110. PRN labetalol or hydralazine if BP above these parameters. Avoid oral antihypertensives. - TTE w/ bubble - Check A1c and LDL + add statin per guidelines - fully anticoagulated with Eliquis - q4 hr neuro checks - STAT head CT for any change in neuro exam - Tele - PT/OT/SLP - Stroke education - Amb referral to neurology upon discharge   -[facility] consult for transportation needs  Pt seen by NP/Neuro and later by MD. Note/plan to be edited by MD as needed.  [place] , MSN, AGACNP-BC [facility] See Amion for schedule and pager information 02/12/2022 10:33 AM

## 2022-02-15 ENCOUNTER — Telehealth: Payer: Self-pay

## 2022-02-15 NOTE — Telephone Encounter (Signed)
Transition Care Management Follow-up Telephone Call Date of discharge and from where: 02/12/2022; [facility] How have you been since you were released from the hospital? Back on  medications still working through it Any questions or concerns? No  Items Reviewed: Did the pt receive and understand the discharge instructions provided? Yes  Medications obtained and verified? Yes  Other? No  Any new allergies since your discharge? No  Dietary orders reviewed? No Do you have support at home?  yes  Home Care and Equipment/Supplies: Were home health services ordered? yes If so, what is the name of the agency? N/A  Has the agency set up a time to come to the patient's home? no Were any new equipment or medical sunopplies ordered?  No What is the name of the medical supply agency? N/A Were you able to get the supplies/equipment? not applicable Do you have any questions related to the use of the equipment or supplies? No  Functional Questionnaire: (I = Independent and D = Dependent) ADLs: I  Bathing/Dressing- I  Meal Prep- I  Eating- I  Maintaining continence- I  Transferring/Ambulation- I  Managing Meds- I  Follow up appointments reviewed:  PCP Hospital f/u appt confirmed? Yes  Scheduled to see David Honour, MD on 03/15/2022 @ 130PM. [place] [facility] f/u appt confirmed? Yes  Scheduled to see [facility] on 02/20/2022 @ N/A. Are transportation arrangements needed? Yes  If their condition worsens, is the pt aware to call PCP or go to the Emergency Dept.? Yes Was the patient provided with contact information for the PCP's office or ED? Yes Was to pt encouraged to call back with questions or concerns? Yes

## 2022-03-06 ENCOUNTER — Other Ambulatory Visit: Payer: Self-pay | Admitting: Orthopedic Surgery

## 2022-03-15 ENCOUNTER — Ambulatory Visit (INDEPENDENT_AMBULATORY_CARE_PROVIDER_SITE_OTHER): Payer: Medicare Other | Admitting: Family Medicine

## 2022-03-15 ENCOUNTER — Encounter: Payer: Self-pay | Admitting: Family Medicine

## 2022-03-15 VITALS — BP 130/78 | HR 66 | Temp 96.8°F | Ht 73.0 in | Wt 210.0 lb

## 2022-03-15 DIAGNOSIS — I480 Paroxysmal atrial fibrillation: Secondary | ICD-10-CM

## 2022-03-15 DIAGNOSIS — I69398 Other sequelae of cerebral infarction: Secondary | ICD-10-CM

## 2022-03-15 DIAGNOSIS — J438 Other emphysema: Secondary | ICD-10-CM | POA: Diagnosis not present

## 2022-03-15 DIAGNOSIS — Z87891 Personal history of nicotine dependence: Secondary | ICD-10-CM

## 2022-03-15 DIAGNOSIS — I700 Atherosclerosis of aorta: Secondary | ICD-10-CM

## 2022-03-15 DIAGNOSIS — F17210 Nicotine dependence, cigarettes, uncomplicated: Secondary | ICD-10-CM | POA: Diagnosis not present

## 2022-03-15 DIAGNOSIS — M545 Low back pain, unspecified: Secondary | ICD-10-CM | POA: Diagnosis not present

## 2022-03-15 DIAGNOSIS — R269 Unspecified abnormalities of gait and mobility: Secondary | ICD-10-CM

## 2022-03-15 DIAGNOSIS — I10 Essential (primary) hypertension: Secondary | ICD-10-CM | POA: Diagnosis not present

## 2022-03-15 DIAGNOSIS — G5602 Carpal tunnel syndrome, left upper limb: Secondary | ICD-10-CM | POA: Diagnosis not present

## 2022-03-15 DIAGNOSIS — E7849 Other hyperlipidemia: Secondary | ICD-10-CM

## 2022-03-15 DIAGNOSIS — G8929 Other chronic pain: Secondary | ICD-10-CM

## 2022-03-15 NOTE — Progress Notes (Signed)
Provider:  Alain Honey, MD  Careteam: Patient Care Team: Wardell Honour, MD as PCP - General (Family Medicine) [name], MD as PCP - Cardiology (Cardiology) Vickie Epley, MD as PCP - Electrophysiology (Cardiology)  PLACE OF SERVICE:  [place] Directive information Does Patient Have a Medical Advance Directive?: Yes, Type of Advance Directive: [place];Living will;Out of facility DNR (pink MOST or yellow form), Pre-existing out of facility DNR order (yellow form or pink MOST form): Pink MOST form placed in chart (order not valid for inpatient use), Does patient want to make changes to medical advance directive?: No - Patient declined  No Known Allergies  Chief Complaint  Patient presents with   Acute Visit    Rash on upper thigh.    Quality Metrics    Discuss need for hep c screening, td/tdap, flu  additional covid boosters, and lung cancer screening.  Vaccine history verified via [facility].       HPI: Patient is a 72 y.o. male .  Here for medical management of chronic problems including atherosclerosis of aorta, status post several CVAs, paroxysmal atrial fibrillation, emphysema, hyperlipidemia,.  Since his last visit patient has had several ER visits with him leaving AMA most of the time.  He continues to smoke.  Compliance with medicines as somewhat erratic.  We discussed today importance of him taking especially his metoprolol and apixaban.  He also uses trazodone for sleep at night and atorvastatin for his lipids Today he is complaining of some numbness in his left fingers.  Involves primarily the middle 3 fingers and sometimes wakes him up at night with numbness.  Review of Systems:  Review of Systems  Constitutional: Negative.   HENT: Negative.    Eyes: Negative.   Respiratory:  Positive for shortness of breath.   Cardiovascular: Negative.   Gastrointestinal: Negative.   Genitourinary: Negative.   Musculoskeletal:   Positive for back pain.  Neurological:  Positive for tingling.  All other systems reviewed and are negative.   Past Medical History:  Diagnosis Date   Aortic atherosclerosis (HCC)    Arthritis    Benign prostatic hyperplasia (BPH) with urinary urgency    COPD (chronic obstructive pulmonary disease) ([place])    Coronary artery disease    10/18 PCI/DES to pLAD, mild nonobstructive disease in the Lcx/RCA. Normal EF.    Depression with anxiety    Dyslipidemia    Dysrhythmia 2016   irregular heartbeat   Gallstones    GERD (gastroesophageal reflux disease)    History of kidney stones    Hyperthyroidism    Leg cramps    Multiple pulmonary nodules    Myocardial infarction ([place])    2018   Pneumonia    Seasonal allergies    Stroke [facility])    Vitamin B 12 deficiency    Past Surgical History:  Procedure Laterality Date   COLONOSCOPY     CORONARY ANGIOPLASTY  10/09/2016   CORONARY STENT INTERVENTION N/A 10/09/2016   Procedure: CORONARY STENT INTERVENTION;  Surgeon: Nelva Bush, MD;  Location: [place] [facility];  Service: Cardiovascular;  Laterality: N/A;   CYSTOSCOPY WITH RETROGRADE PYELOGRAM, URETEROSCOPY AND STENT PLACEMENT Left 06/17/2019   Procedure: CYSTOSCOPY WITH RETROGRADE PYELOGRAM, URETEROSCOPY AND STENT PLACEMENT;  Surgeon: Robley Fries, MD;  Location: [facility];  Service: Urology;  Laterality: Left;  1 HR   HOLMIUM LASER APPLICATION Left [id number]   Procedure: HOLMIUM LASER APPLICATION;  Surgeon: Robley Fries, MD;  Location: [facility];  Service: Urology;  Laterality: Left;   INTRAVASCULAR ULTRASOUND/IVUS N/A 10/09/2016   Procedure: Intravascular Ultrasound/IVUS;  Surgeon: Nelva Bush, MD;  Location: [place] [facility];  Service: Cardiovascular;  Laterality: N/A;   IR CHOLANGIOGRAM EXISTING TUBE  11/18/2018   IR PERC CHOLECYSTOSTOMY  11/16/2018   LEFT HEART CATH AND CORONARY ANGIOGRAPHY N/A 10/09/2016   Procedure: LEFT HEART CATH AND CORONARY ANGIOGRAPHY;  Surgeon: Nelva Bush, MD;  Location: [place] [facility];  Service: Cardiovascular;  Laterality: N/A;   LOOP RECORDER INSERTION N/A 02/20/2020   Procedure: LOOP RECORDER INSERTION;  Surgeon: Vickie Epley, MD;  Location: [place] [facility];  Service: Cardiovascular;  Laterality: N/A;   Social History:   reports that he has been smoking cigarettes. He started smoking about 61 years ago. He has a 90.00 pack-year smoking history. He has never used smokeless tobacco. He reports that he does not currently use alcohol. He reports that he does not use drugs.  Family History  Problem Relation Age of Onset   Alcohol abuse Father        called [facility] per pt   Cancer Father        [facility] CANCER PER PT NON SURGICAL    Colon cancer Neg Hx    Colon polyps Neg Hx    Esophageal cancer Neg Hx    Rectal cancer Neg Hx    Stomach cancer Neg Hx     Medications: Patient's Medications  New Prescriptions   No medications on file  Previous Medications   ACETAMINOPHEN (TYLENOL) 500 MG TABLET    Take 1,000 mg by mouth every 6 (six) hours as needed for mild pain.   APIXABAN (ELIQUIS) 5 MG TABS TABLET    Take 1 tablet (5 mg total) by mouth 2 (two) times daily.   ASPIRIN EC 81 MG TABLET    Take 1 tablet (81 mg total) by mouth daily. Swallow whole.   ATORVASTATIN (LIPITOR) 80 MG TABLET    Take 1 tablet (80 mg total) by mouth daily.   BLOOD PRESSURE MONITORING (COMFORT TOUCH BP CUFF/LARGE) MISC    1 Device by Other route 2 (two) times daily. Use to check Blood Pressure twice daily. Dx: I10   CYANOCOBALAMIN (VITAMIN B12) 1000 MCG TABLET    Take 1 tablet (1,000 mcg total) by mouth daily.   METHIMAZOLE (TAPAZOLE) 5 MG TABLET    Take 1 tablet (5 mg total) by mouth daily. TAKE 1 TABLET(5 MG) BY MOUTH DAILY   TAMSULOSIN (FLOMAX) 0.4 MG CAPS CAPSULE    TAKE 2 CAPSULES BY MOUTH EVERY DAY   TRAZODONE (DESYREL) 100 MG TABLET    Take 1 tablet (100 mg total) by mouth at bedtime as needed for sleep.  Modified Medications    No medications on file  Discontinued Medications   CITALOPRAM (CELEXA) 20 MG TABLET    Take 1 tablet (20 mg total) by mouth daily.   METOPROLOL SUCCINATE (TOPROL XL) 25 MG 24 HR TABLET    Take 1 tablet (25 mg total) by mouth daily.    Physical Exam:  Vitals:   03/15/22 1326  BP: 130/78  Pulse: 66  Temp: (!) 96.8 F (36 C)  SpO2: 97%  Weight: 210 lb (95.3 kg)  Height: '6\' 1"'$  (1.854 m)   Body mass index is 27.71 kg/m. Wt Readings from Last 3 Encounters:  03/15/22 210 lb (95.3 kg)  02/01/22 198 lb (89.8 kg)  09/07/21 200 lb (90.7  kg)    Physical Exam Vitals and nursing note reviewed.  Constitutional:      Appearance: Normal appearance.  Eyes:     Conjunctiva/sclera: Conjunctivae normal.     Pupils: Pupils are equal, round, and reactive to light.  Cardiovascular:     Rate and Rhythm: Normal rate. Rhythm irregular.  Pulmonary:     Effort: Pulmonary effort is normal.     Breath sounds: Normal breath sounds.  Abdominal:     General: Bowel sounds are normal.     Palpations: Abdomen is soft.  Neurological:     General: No focal deficit present.     Mental Status: He is alert and oriented to person, place, and time.     Comments: Patient has positive Phalen test left side suggesting possible carpal tunnel syndrome  Psychiatric:        Mood and Affect: Mood normal.        Behavior: Behavior normal.     Labs reviewed: Basic Metabolic Panel: Recent Labs    04/11/21 2003 04/13/21 1441 02/01/22 1955 02/11/22 1459 02/12/22 0251  NA  --    < > 141 139 138  K  --    < > 4.3 4.2 3.8  CL  --    < > 104 104 104  CO2  --    < > '31 27 30  '$ GLUCOSE  --    < > 93 98 95  BUN  --    < > '9 11 9  '$ CREATININE  --    < > 1.15 1.05 1.20  CALCIUM  --    < > 9.0 8.5* 8.7*  MG 2.1  --   --   --   --   TSH 0.552  --   --   --  0.757   < > = values in this interval not displayed.   Liver Function Tests: Recent Labs    09/07/21 1858 02/11/22 1459 02/12/22 0251  AST '20 18 15   '$ ALT '14 13 12  '$ ALKPHOS 87 84 81  BILITOT 0.7 1.4* 1.3*  PROT 5.8* 5.9* 5.3*  ALBUMIN 3.4* 3.5 3.1*   No results for input(s): "LIPASE", "AMYLASE" in the last 8760 hours. No results for input(s): "AMMONIA" in the last 8760 hours. CBC: Recent Labs    02/01/22 1955 02/11/22 1459 02/12/22 0251  WBC 6.0 6.9 8.0  NEUTROABS 2.7 4.1 4.4  HGB 18.0* 17.1* 16.6  HCT 52.2* 51.9 49.9  MCV 86.7 86.1 86.9  PLT 127* 127* 122*   Lipid Panel: Recent Labs    02/11/22 1459 02/12/22 0251  CHOL 156 149  HDL 36* 32*  LDLCALC 99 92  TRIG 104 125  CHOLHDL 4.3 4.7   TSH: Recent Labs    04/11/21 2003 02/12/22 0251  TSH 0.552 0.757   A1C: Lab Results  Component Value Date   HGBA1C 5.2 02/12/2022     Assessment/Plan  1. Aortic atherosclerosis ([place]) Treating blood pressure and lipids.  Patient continues to smoke however  2. Chronic bilateral low back pain without sciatica Requests referral to pain management.  Had previously been there.  Sounds like he may have a surgical lesion but he declined to go through PT which surgeon told him was necessary prior to any procedure  3. Cigarette nicotine dependence without complication Patient has greater than 50-pack-year history we will screen for lung cancer with low-dose CT  4. Essential hypertension Blood pressure is good today 130/78 on metoprolol 25 mg  5. Gait disturbance, post-stroke No recent falls and no use of assistive device  6. Other hyperlipidemia Patient continues to take atorvastatin 80 mg but I think not daily.  Last LDL was 92 last month  7. PAF (paroxysmal atrial fibrillation) (HCC) Irregular heartbeat today suspect A-fib.  Stressed importance of taking rate limiting drug as well as anticoagulant   Alain Honey, MD [place] [phone]

## 2022-03-16 ENCOUNTER — Telehealth: Payer: Self-pay | Admitting: Orthopaedic Surgery

## 2022-03-16 NOTE — Telephone Encounter (Signed)
Mailed reminder letter to patient today

## 2022-03-28 ENCOUNTER — Ambulatory Visit (INDEPENDENT_AMBULATORY_CARE_PROVIDER_SITE_OTHER): Payer: Medicare Other | Admitting: Physician Assistant

## 2022-03-28 ENCOUNTER — Other Ambulatory Visit: Payer: Self-pay

## 2022-03-28 DIAGNOSIS — R202 Paresthesia of skin: Secondary | ICD-10-CM

## 2022-03-28 NOTE — Progress Notes (Signed)
Office Visit Note   Patient: David Hendricks           Date of Birth: 06-18-1950           MRN: [id number] Visit Date: 03/28/2022              Requested by: Wardell Honour, MD [address] [place],  [place] [address] PCP: Wardell Honour, MD   Assessment & Plan: Visit Diagnoses:  1. Paresthesia of hand, bilateral     Plan: Impression is bilateral hand paresthesias.  I suspect this is likely a result of the CVA but it is possible that he may have a component of carpal tunnel syndrome.  Will go ahead and order nerve conduction study bilateral upper extremity to rule this out.  We have provided him with night splints to wear in the meantime.  Follow-up with [place] once the nerve conduction studies have been completed.  Follow-Up Instructions: Return for f/u after ncs/emg.   Orders:  No orders of the defined types were placed in this encounter.  No orders of the defined types were placed in this encounter.     Procedures: No procedures performed   Clinical Data: No additional findings.   Subjective: Chief Complaint  Patient presents with   Left Hand - Pain    HPI patient is a 72 year old gentleman who comes in today with bilateral hand numbness and tingling left greater than right.  Symptoms have been ongoing for several months but worsened approximately 3 weeks ago after sustaining a CVA.  He left the hospital AMA.  He notes numbness and tingling to all 10 fingers with associated weakness.  He has worsening of his symptoms at night when he is trying to sleep where he frequently wakes up shaking his hands.  Has not tried a night splint.  He has not had carpal tunnel injections.  Does have a history of neck pain with recent MRI showing foraminal narrowing C2-3, C3-4 and C4-5 as well as spinal canal narrowing C3-4, C4-5 and C5-6.  Review of Systems as detailed in HPI.  All others reviewed and are negative.   Objective: Vital Signs: There were no vitals taken for this  visit.  Physical Exam well-developed well-nourished gentleman in no acute distress.  Alert and oriented x 3.  Ortho Exam bilateral hand exam shows negative Phalen and negative Tinel at the wrist and elbow.  No thenar atrophy.  Equal sensation both sides.  Specialty Comments:  No specialty comments available.  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   Dysphagia following cerebrovascular accident 02/17/2020   Dysarthria due to recent cerebral infarction 02/17/2020   Obesity 02/16/2020   Hyperbilirubinemia 02/16/2020   CVA (cerebral vascular accident) ([place]) 02/15/2020   Thyroid mass 02/15/2020   Anxiety and depression 02/15/2020   Pulmonary emphysema ([place]) 06/02/2019   Aortic atherosclerosis ([place]) 06/02/2019   Smokers' cough ([place]) 06/02/2019   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   Multinodular goiter 12/24/2018   Subclinical hyperthyroidism 12/24/2018   BPH (  benign prostatic hyperplasia) 11/20/2018   Pulmonary nodules 11/15/2018   Esophageal thickening 11/15/2018   Cigarette nicotine dependence without complication [id number]   Old MI (myocardial infarction) 01/30/2017   CAD (coronary artery disease) 01/30/2017   Hyperlipidemia 10/10/2016   History of non-ST elevation myocardial infarction (NSTEMI) 10/09/2016   Tobacco abuse 08/28/2016   Difficulty sleeping 04/22/2016   Benign prostatic hyperplasia with urinary frequency 02/11/2016   Past Medical  History:  Diagnosis Date   Aortic atherosclerosis (HCC)    Arthritis    Benign prostatic hyperplasia (BPH) with urinary urgency    COPD (chronic obstructive pulmonary disease) (HCC)    Coronary artery disease    10/18 PCI/DES to pLAD, mild nonobstructive disease in the Lcx/RCA. Normal EF.    Depression with anxiety    Dyslipidemia    Dysrhythmia 2016   irregular heartbeat   Gallstones    GERD (gastroesophageal reflux disease)    History of kidney stones    Hyperthyroidism    Leg cramps    Multiple pulmonary nodules    Myocardial infarction ([place])    2018   Pneumonia    Seasonal allergies    Stroke ([place])    Vitamin B 12 deficiency     Family History  Problem Relation Age of Onset   Alcohol abuse Father        called [facility] per pt   Cancer Father        [facility] PER PT NON SURGICAL    Colon cancer Neg Hx    Colon polyps Neg Hx    Esophageal cancer Neg Hx    Rectal cancer Neg Hx    Stomach cancer Neg Hx     Past Surgical History:  Procedure Laterality Date   COLONOSCOPY     CORONARY ANGIOPLASTY  10/09/2016   CORONARY STENT INTERVENTION N/A 10/09/2016   Procedure: CORONARY STENT INTERVENTION;  Surgeon: Nelva Bush, MD;  Location: [facility];  Service: Cardiovascular;  Laterality: N/A;   CYSTOSCOPY WITH RETROGRADE PYELOGRAM, URETEROSCOPY AND STENT PLACEMENT Left 06/17/2019   Procedure: CYSTOSCOPY WITH RETROGRADE PYELOGRAM, URETEROSCOPY AND STENT PLACEMENT;  Surgeon: Robley Fries, MD;  Location: [facility];  Service: Urology;  Laterality: Left;  1 HR   HOLMIUM LASER APPLICATION Left [id number]   Procedure: HOLMIUM LASER APPLICATION;  Surgeon: Robley Fries, MD;  Location: [facility];  Service: Urology;  Laterality: Left;   INTRAVASCULAR ULTRASOUND/IVUS N/A 10/09/2016   Procedure: Intravascular Ultrasound/IVUS;  Surgeon: Nelva Bush, MD;  Location: [facility];  Service: Cardiovascular;  Laterality: N/A;   IR CHOLANGIOGRAM EXISTING TUBE   11/18/2018   IR PERC CHOLECYSTOSTOMY  11/16/2018   LEFT HEART CATH AND CORONARY ANGIOGRAPHY N/A 10/09/2016   Procedure: LEFT HEART CATH AND CORONARY ANGIOGRAPHY;  Surgeon: Nelva Bush, MD;  Location: [facility];  Service: Cardiovascular;  Laterality: N/A;   LOOP RECORDER INSERTION N/A 02/20/2020   Procedure: LOOP RECORDER INSERTION;  Surgeon: Vickie Epley, MD;  Location: [facility];  Service: Cardiovascular;  Laterality: N/A;   Social History   Occupational History   Occupation: retired Mudlogger  Tobacco Use   Smoking status: Every Day    Packs/day: 2.00    Years: 45.00    Additional pack years: 0.00    Total pack years: 90.00    Types: Cigarettes    Start date: 01/09/1961   Smokeless tobacco: Never   Tobacco comments:    tobacco info given  Vaping  Use   Vaping Use: Never used  Substance and Sexual Activity   Alcohol use: Not Currently   Drug use: No   Sexual activity: Not on file

## 2022-04-14 ENCOUNTER — Telehealth: Payer: Self-pay

## 2022-04-14 ENCOUNTER — Encounter: Payer: Medicare Other | Admitting: Physical Medicine and Rehabilitation

## 2022-04-14 NOTE — Telephone Encounter (Signed)
LVM to return call to reschedule NCV

## 2022-04-17 NOTE — Addendum Note (Signed)
Addended by: Elveria Royals on: 04/17/2022 04:07 PM   Modules accepted: Orders

## 2022-04-24 ENCOUNTER — Ambulatory Visit: Payer: Medicare Other | Admitting: Student in an Organized Health Care Education/Training Program

## 2022-05-23 ENCOUNTER — Telehealth: Payer: Self-pay | Admitting: *Deleted

## 2022-05-23 NOTE — Telephone Encounter (Signed)
Procedure Modifiers Provider Requested Approved  [id number] - CT CHEST LUNG CANCER SCREENING LOW DOSE WO CONTRAST none  1 1   Diagnosis Information  Diagnosis  J43.8 (ICD-10-CM) - Other emphysema (HCC)  Z87.891 (ICD-10-CM) - Personal history of nicotine dependence   Referral Notes Number of Notes: 5 . Type Date User Summary Attachment  General 05/23/2022  2:43 PM Caryl Bis Auto: Referral message -  Note: ----- Message ----- From: Caryl Bis Sent: 05/23/2022   2:43 PM EDT To: Beckey Downing Sherwood Castilla, CMA     Can you find out if we can close the order/referral pt states that he is no longer using [facility] as his pcp?   Is this ok to cancel order due to patient has new PCP?

## 2022-05-24 NOTE — Telephone Encounter (Signed)
Cancel order; result needs to go to ordering provider

## 2022-05-29 ENCOUNTER — Other Ambulatory Visit: Payer: Self-pay | Admitting: Family Medicine

## 2022-05-29 DIAGNOSIS — F5101 Primary insomnia: Secondary | ICD-10-CM

## 2022-06-14 ENCOUNTER — Other Ambulatory Visit: Payer: Self-pay | Admitting: Family Medicine

## 2022-06-29 IMAGING — DX DG CHEST 1V PORT
1 series · 2 of 2 positions shown · non-contrast
Comparison: 02/15/2020 and CT chest 11/15/2018.

CLINICAL DATA: Hypoxia.

EXAM:
PORTABLE CHEST 1 VIEW

[Series 1: chest ap · 0.14mm/px · 2 of 2 slices shown]
[im 1/2]
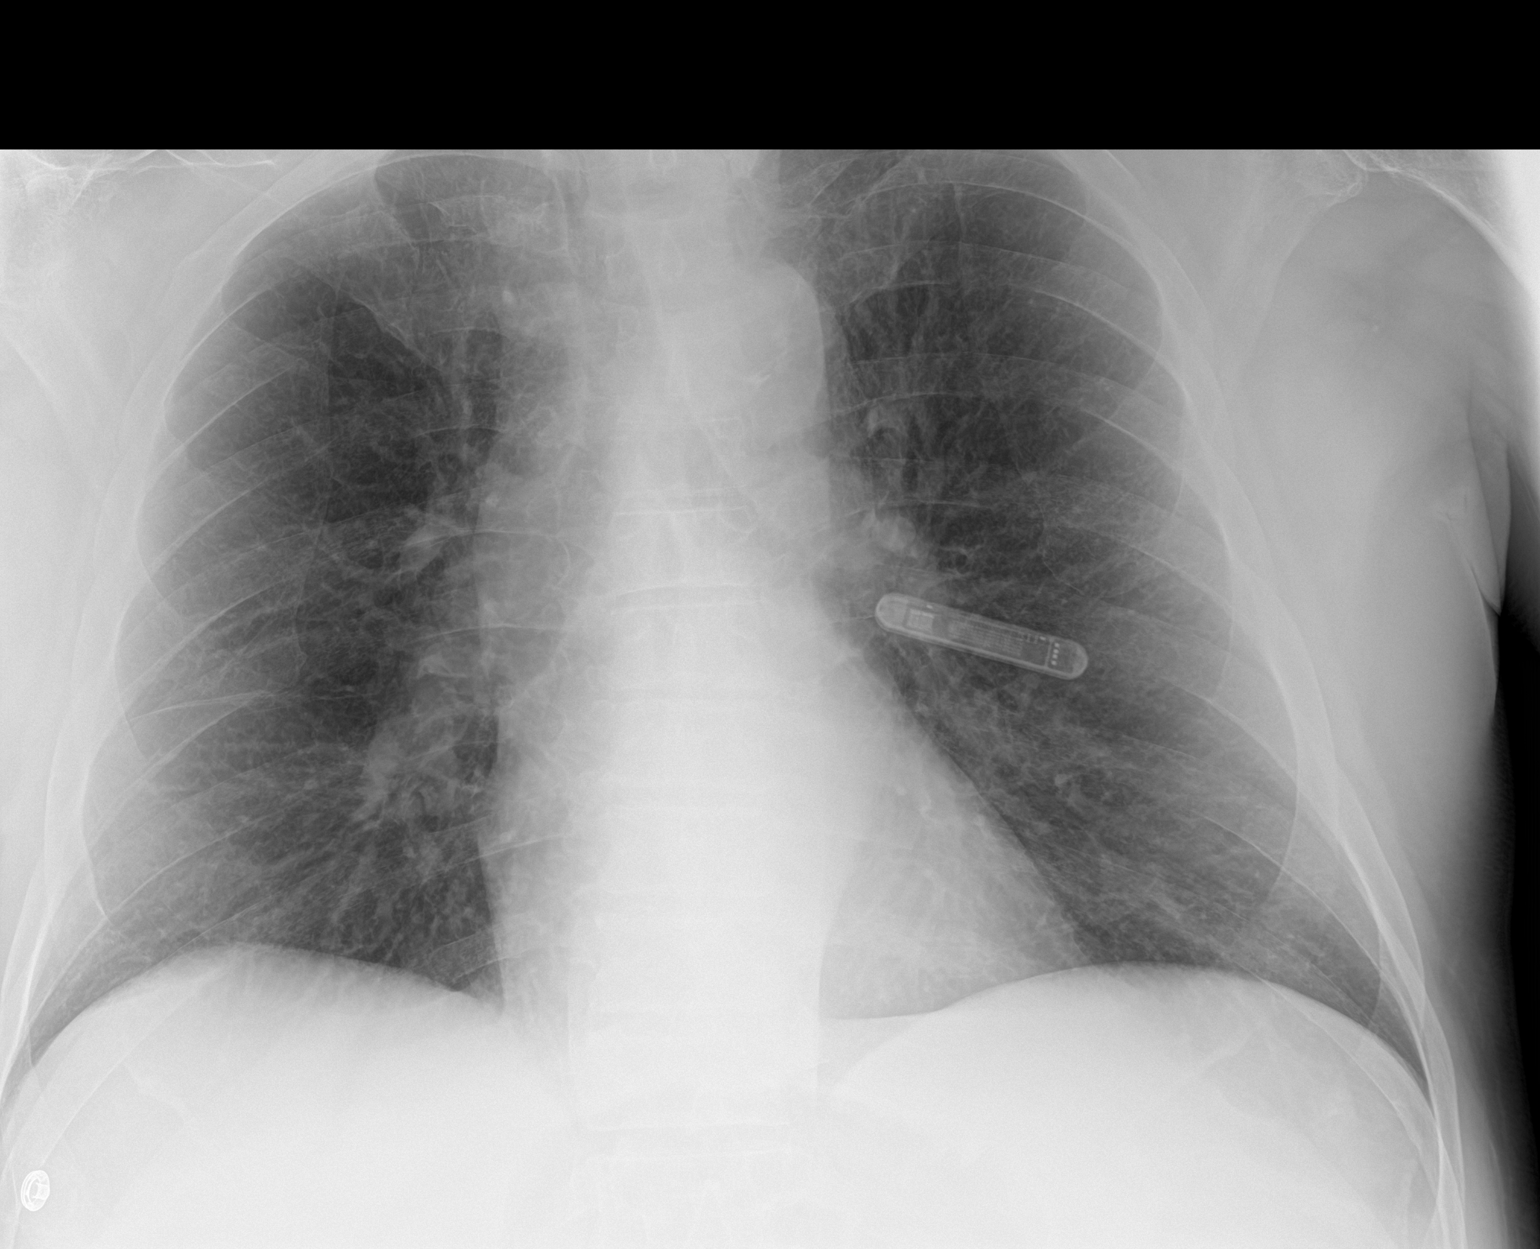
[im 2/2]
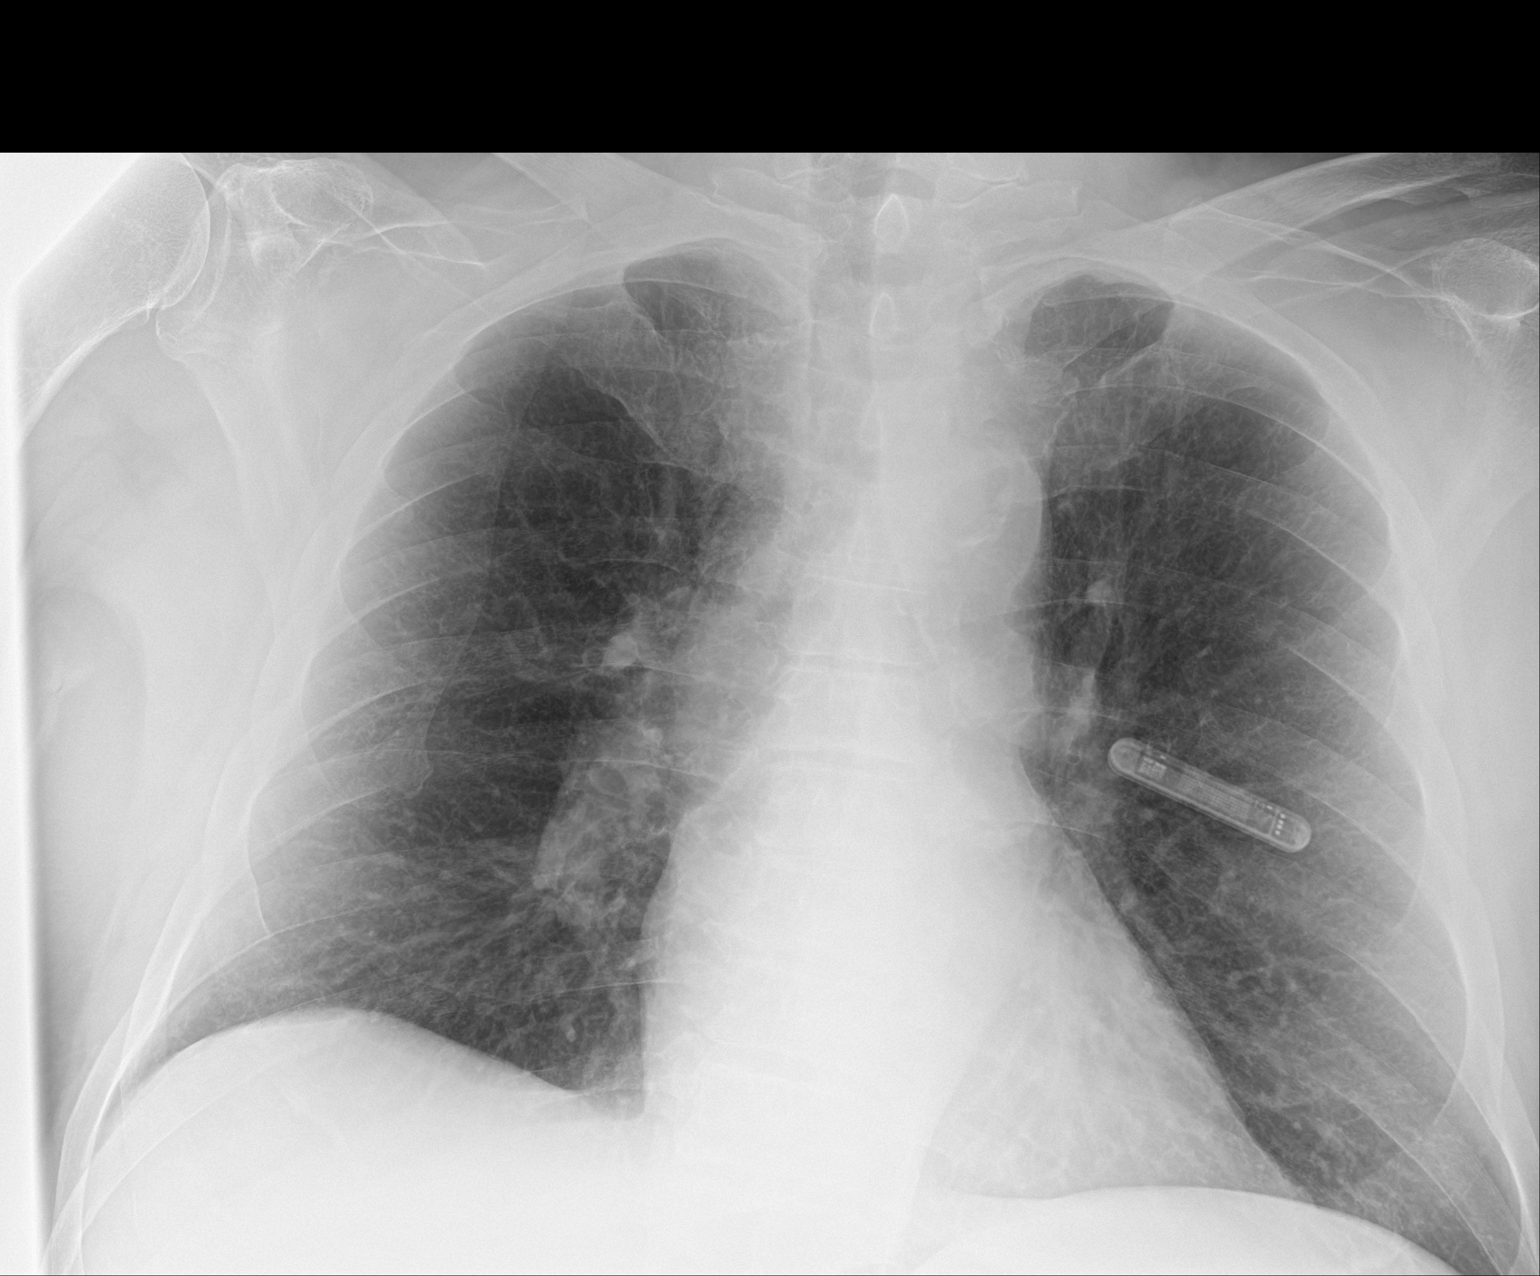

[2 of 2 positions shown; findings below may reference images not displayed]

FINDINGS: Trachea is midline. Heart size normal. Thoracic aorta is calcified.
Loop recorder is in place. Lungs are clear. No pleural fluid.
IMPRESSION: No acute findings.

## 2022-07-19 ENCOUNTER — Ambulatory Visit: Payer: Medicare Other | Admitting: Family Medicine

## 2022-08-25 DIAGNOSIS — D6869 Other thrombophilia: Secondary | ICD-10-CM | POA: Diagnosis not present

## 2022-08-25 DIAGNOSIS — Z79899 Other long term (current) drug therapy: Secondary | ICD-10-CM | POA: Diagnosis not present

## 2022-08-25 DIAGNOSIS — Z114 Encounter for screening for human immunodeficiency virus [HIV]: Secondary | ICD-10-CM | POA: Diagnosis not present

## 2022-08-25 DIAGNOSIS — Z136 Encounter for screening for cardiovascular disorders: Secondary | ICD-10-CM | POA: Diagnosis not present

## 2022-08-25 DIAGNOSIS — Z125 Encounter for screening for malignant neoplasm of prostate: Secondary | ICD-10-CM | POA: Diagnosis not present

## 2022-08-25 DIAGNOSIS — E042 Nontoxic multinodular goiter: Secondary | ICD-10-CM | POA: Diagnosis not present

## 2022-08-25 DIAGNOSIS — G8929 Other chronic pain: Secondary | ICD-10-CM | POA: Diagnosis not present

## 2022-08-25 DIAGNOSIS — E039 Hypothyroidism, unspecified: Secondary | ICD-10-CM | POA: Diagnosis not present

## 2022-08-25 DIAGNOSIS — I4891 Unspecified atrial fibrillation: Secondary | ICD-10-CM | POA: Diagnosis not present

## 2022-08-25 DIAGNOSIS — Z1159 Encounter for screening for other viral diseases: Secondary | ICD-10-CM | POA: Diagnosis not present

## 2022-08-25 DIAGNOSIS — J439 Emphysema, unspecified: Secondary | ICD-10-CM | POA: Diagnosis not present

## 2022-08-25 DIAGNOSIS — K219 Gastro-esophageal reflux disease without esophagitis: Secondary | ICD-10-CM | POA: Diagnosis not present

## 2022-08-31 ENCOUNTER — Emergency Department (HOSPITAL_COMMUNITY)
Admission: EM | Admit: 2022-08-31 | Discharge: 2022-09-01 | Disposition: A | Discharge status: Home / Self Care | Payer: Medicare Other | Source: Home / Self Care | Attending: Emergency Medicine | Admitting: Emergency Medicine

## 2022-08-31 ENCOUNTER — Other Ambulatory Visit: Payer: Self-pay

## 2022-08-31 ENCOUNTER — Encounter (HOSPITAL_COMMUNITY): Payer: Self-pay

## 2022-08-31 DIAGNOSIS — G319 Degenerative disease of nervous system, unspecified: Secondary | ICD-10-CM | POA: Diagnosis not present

## 2022-08-31 DIAGNOSIS — R479 Unspecified speech disturbances: Secondary | ICD-10-CM | POA: Diagnosis not present

## 2022-08-31 DIAGNOSIS — Z8673 Personal history of transient ischemic attack (TIA), and cerebral infarction without residual deficits: Secondary | ICD-10-CM | POA: Insufficient documentation

## 2022-08-31 DIAGNOSIS — I252 Old myocardial infarction: Secondary | ICD-10-CM | POA: Diagnosis not present

## 2022-08-31 DIAGNOSIS — R2689 Other abnormalities of gait and mobility: Secondary | ICD-10-CM | POA: Diagnosis not present

## 2022-08-31 DIAGNOSIS — R4781 Slurred speech: Secondary | ICD-10-CM | POA: Diagnosis not present

## 2022-08-31 DIAGNOSIS — E042 Nontoxic multinodular goiter: Secondary | ICD-10-CM | POA: Insufficient documentation

## 2022-08-31 DIAGNOSIS — R9431 Abnormal electrocardiogram [ECG] [EKG]: Secondary | ICD-10-CM | POA: Diagnosis not present

## 2022-08-31 DIAGNOSIS — I2510 Atherosclerotic heart disease of native coronary artery without angina pectoris: Secondary | ICD-10-CM | POA: Insufficient documentation

## 2022-08-31 DIAGNOSIS — F17210 Nicotine dependence, cigarettes, uncomplicated: Secondary | ICD-10-CM | POA: Insufficient documentation

## 2022-08-31 DIAGNOSIS — J449 Chronic obstructive pulmonary disease, unspecified: Secondary | ICD-10-CM | POA: Insufficient documentation

## 2022-08-31 DIAGNOSIS — I700 Atherosclerosis of aorta: Secondary | ICD-10-CM | POA: Insufficient documentation

## 2022-08-31 DIAGNOSIS — Z471 Aftercare following joint replacement surgery: Secondary | ICD-10-CM | POA: Diagnosis not present

## 2022-08-31 DIAGNOSIS — R4182 Altered mental status, unspecified: Secondary | ICD-10-CM | POA: Diagnosis not present

## 2022-08-31 DIAGNOSIS — J439 Emphysema, unspecified: Secondary | ICD-10-CM | POA: Diagnosis not present

## 2022-08-31 DIAGNOSIS — J432 Centrilobular emphysema: Secondary | ICD-10-CM | POA: Diagnosis not present

## 2022-08-31 NOTE — ED Triage Notes (Signed)
Pt comes in from home with the feeling of being intoxicated despite not drinking. Stroke screen negative for EMS. Has had a stroke in the past and has residual numbness and tingling. Was recently given gabapentin but they prescribed the wrong dose. Was given 300 mg and states it was supposed to be 100 mg. Pt is A&Ox4 speech is clear.

## 2022-09-01 ENCOUNTER — Emergency Department (HOSPITAL_COMMUNITY): Payer: Medicare Other

## 2022-09-01 DIAGNOSIS — I700 Atherosclerosis of aorta: Secondary | ICD-10-CM | POA: Diagnosis not present

## 2022-09-01 DIAGNOSIS — R4182 Altered mental status, unspecified: Secondary | ICD-10-CM | POA: Diagnosis not present

## 2022-09-01 DIAGNOSIS — E042 Nontoxic multinodular goiter: Secondary | ICD-10-CM | POA: Diagnosis not present

## 2022-09-01 DIAGNOSIS — J432 Centrilobular emphysema: Secondary | ICD-10-CM | POA: Diagnosis not present

## 2022-09-01 DIAGNOSIS — G319 Degenerative disease of nervous system, unspecified: Secondary | ICD-10-CM | POA: Diagnosis not present

## 2022-09-01 DIAGNOSIS — Z471 Aftercare following joint replacement surgery: Secondary | ICD-10-CM | POA: Diagnosis not present

## 2022-09-01 LAB — URINALYSIS, ROUTINE W REFLEX MICROSCOPIC
Bacteria, UA: NONE SEEN
Bilirubin Urine: NEGATIVE
Glucose, UA: NEGATIVE mg/dL
Hgb urine dipstick: NEGATIVE
Ketones, ur: NEGATIVE mg/dL
Leukocytes,Ua: NEGATIVE
Nitrite: NEGATIVE
Protein, ur: NEGATIVE mg/dL
Specific Gravity, Urine: 1.02 (ref 1.005–1.030)
pH: 6 (ref 5.0–8.0)

## 2022-09-01 LAB — RAPID URINE DRUG SCREEN, HOSP PERFORMED
Amphetamines: NOT DETECTED
Barbiturates: NOT DETECTED
Benzodiazepines: NOT DETECTED
Cocaine: NOT DETECTED
Opiates: NOT DETECTED
Tetrahydrocannabinol: NOT DETECTED

## 2022-09-01 LAB — COMPREHENSIVE METABOLIC PANEL
ALT: 14 U/L (ref 0–44)
AST: 17 U/L (ref 15–41)
Albumin: 3.2 g/dL — ABNORMAL LOW (ref 3.5–5.0)
Alkaline Phosphatase: 96 U/L (ref 38–126)
Anion gap: 9 (ref 5–15)
BUN: 11 mg/dL (ref 8–23)
CO2: 27 mmol/L (ref 22–32)
Calcium: 8.7 mg/dL — ABNORMAL LOW (ref 8.9–10.3)
Chloride: 102 mmol/L (ref 98–111)
Creatinine, Ser: 1.02 mg/dL (ref 0.61–1.24)
GFR, Estimated: >60 mL/min (ref >60)
Glucose, Bld: 102 mg/dL — ABNORMAL HIGH (ref 70–99)
Potassium: 3.7 mmol/L (ref 3.5–5.1)
Sodium: 138 mmol/L (ref 135–145)
Total Bilirubin: 1.2 mg/dL (ref 0.3–1.2)
Total Protein: 5.9 g/dL — ABNORMAL LOW (ref 6.5–8.1)

## 2022-09-01 LAB — DIFFERENTIAL
Abs Immature Granulocytes: 0.02 10*3/uL (ref 0.00–0.07)
Basophils Absolute: 0.1 10*3/uL (ref 0.0–0.1)
Basophils Relative: 1 %
Eosinophils Absolute: 0.3 10*3/uL (ref 0.0–0.5)
Eosinophils Relative: 3 %
Immature Granulocytes: 0 %
Lymphocytes Relative: 29 %
Lymphs Abs: 2.8 10*3/uL (ref 0.7–4.0)
Monocytes Absolute: 0.6 10*3/uL (ref 0.1–1.0)
Monocytes Relative: 6 %
Neutro Abs: 5.8 10*3/uL (ref 1.7–7.7)
Neutrophils Relative %: 61 %

## 2022-09-01 LAB — CBC
HCT: 48 % (ref 39.0–52.0)
Hemoglobin: 16 g/dL (ref 13.0–17.0)
MCH: 28.2 pg (ref 26.0–34.0)
MCHC: 33.3 g/dL (ref 30.0–36.0)
MCV: 84.7 fL (ref 80.0–100.0)
Platelets: 118 10*3/uL — ABNORMAL LOW (ref 150–400)
RBC: 5.67 MIL/uL (ref 4.22–5.81)
RDW: 13.2 % (ref 11.5–15.5)
WBC: 9.7 10*3/uL (ref 4.0–10.5)
nRBC: 0 % (ref 0.0–0.2)

## 2022-09-01 LAB — PROTIME-INR
INR: 1 (ref 0.8–1.2)
Prothrombin Time: 13.7 s (ref 11.4–15.2)

## 2022-09-01 LAB — APTT: aPTT: 29 seconds (ref 24–36)

## 2022-09-01 LAB — I-STAT CHEM 8, ED
BUN: 12 mg/dL (ref 8–23)
Calcium, Ion: 1.11 mmol/L — ABNORMAL LOW (ref 1.15–1.40)
Chloride: 102 mmol/L (ref 98–111)
Creatinine, Ser: 1 mg/dL (ref 0.61–1.24)
Glucose, Bld: 96 mg/dL (ref 70–99)
HCT: 47 % (ref 39.0–52.0)
Hemoglobin: 16 g/dL (ref 13.0–17.0)
Potassium: 3.7 mmol/L (ref 3.5–5.1)
Sodium: 140 mmol/L (ref 135–145)
TCO2: 29 mmol/L (ref 22–32)

## 2022-09-01 LAB — ETHANOL: Alcohol, Ethyl (B): <10 mg/dL (ref <10)

## 2022-09-01 MED ORDER — IOHEXOL 350 MG/ML SOLN
75.0000 mL | Freq: Once | INTRAVENOUS | Status: AC | PRN
  Administered 2022-09-01: 75 mL via INTRAVENOUS

## 2022-09-01 MED ORDER — ONDANSETRON HCL 4 MG/2ML IJ SOLN
4.0000 mg | Freq: Once | INTRAMUSCULAR | Status: AC
  Administered 2022-09-01: 4 mg via INTRAVENOUS
  Filled 2022-09-01: qty 2

## 2022-09-01 NOTE — Discharge Instructions (Signed)
You were evaluated in the Emergency Department and after careful evaluation, we did not find any emergent condition requiring admission or further testing in the hospital.  Your exam/testing today was overall reassuring.  No signs of stroke or any other emergencies.  Follow-up with your regular doctor to discuss your symptoms.  Please return to the Emergency Department if you experience any worsening of your condition.  Thank you for allowing [place] to be a part of your care.

## 2022-09-01 NOTE — ED Provider Notes (Signed)
[facility] Emergency Department Provider Note MRN:  [id number]  Arrival date & time: 09/01/22     Chief Complaint   Altered Mental Status   History of Present Illness   David Hendricks is a 72 y.o. year-old male with a history of COPD, MI, stroke presenting to the ED with chief complaint of altered mental status.  Patient is endorsing slurred speech, trouble finding words, and poor balance since yesterday evening.  At first he thought it was because he took a higher dose of gabapentin than normal.  Took 300 mg instead of 100 mg.  However the symptoms have persisted all day today.  Denies any numbness or weakness to the arms or legs, no pain, no headache.  Review of Systems  A thorough review of systems was obtained and all systems are negative except as noted in the HPI and PMH.   Patient's Health History    Past Medical History:  Diagnosis Date   Aortic atherosclerosis (HCC)    Arthritis    Benign prostatic hyperplasia (BPH) with urinary urgency    COPD (chronic obstructive pulmonary disease) (HCC)    Coronary artery disease    10/18 PCI/DES to pLAD, mild nonobstructive disease in the Lcx/RCA. Normal EF.    Depression with anxiety    Dyslipidemia    Dysrhythmia 2016   irregular heartbeat   Gallstones    GERD (gastroesophageal reflux disease)    History of kidney stones    Hyperthyroidism    Leg cramps    Multiple pulmonary nodules    Myocardial infarction (HCC)    2018   Pneumonia    Seasonal allergies    Stroke [facility])    Vitamin B 12 deficiency     Past Surgical History:  Procedure Laterality Date   COLONOSCOPY     CORONARY ANGIOPLASTY  10/09/2016   CORONARY STENT INTERVENTION N/A 10/09/2016   Procedure: CORONARY STENT INTERVENTION;  Surgeon: Yvonne Kendall, MD;  [facility];  Service: Cardiovascular;  Laterality: N/A;   CORONARY ULTRASOUND/IVUS N/A 10/09/2016   Procedure: Intravascular Ultrasound/IVUS;  Surgeon: Yvonne Kendall, MD;  [facility];  Service: Cardiovascular;  Laterality: N/A;   CYSTOSCOPY WITH RETROGRADE PYELOGRAM, URETEROSCOPY AND STENT PLACEMENT Left 06/17/2019   Procedure: CYSTOSCOPY WITH RETROGRADE PYELOGRAM, URETEROSCOPY AND STENT PLACEMENT;  Surgeon: Noel Christmas, MD;  [facility];  Service: Urology;  Laterality: Left;  1 HR   HOLMIUM LASER APPLICATION Left 06/17/2019   Procedure: HOLMIUM LASER APPLICATION;  Surgeon: Noel Christmas, MD;  [facility];  Service: Urology;  Laterality: Left;   IR CHOLANGIOGRAM EXISTING TUBE  11/18/2018   IR PERC CHOLECYSTOSTOMY  11/16/2018   LEFT HEART CATH AND CORONARY ANGIOGRAPHY N/A 10/09/2016   Procedure: LEFT HEART CATH AND CORONARY ANGIOGRAPHY;  Surgeon: Yvonne Kendall, MD;  [facility];  Service: Cardiovascular;  Laterality: N/A;   LOOP RECORDER INSERTION N/A 02/20/2020   Procedure: LOOP RECORDER INSERTION;  Surgeon: Lanier Prude, MD;  [facility];  Service: Cardiovascular;  Laterality: N/A;    Family History  Problem Relation Age of Onset   Alcohol abuse Father        called [facility] per pt   Cancer Father        [facility] CANCER PER PT NON SURGICAL    Colon cancer Neg Hx    Colon polyps Neg Hx    Esophageal cancer Neg Hx  Rectal cancer Neg Hx    Stomach cancer Neg Hx     Social History   Socioeconomic History   Marital status: Divorced    Spouse name: Not on file   Number of children: 0   Years of education: Not on file   Highest education level: Not on file  Occupational History   Occupation: retired Well[employer] geologist  Tobacco Use   Smoking status: Every Day    Current packs/day: 2.00    Average packs/day: 2.0 packs/day for 61.6 years (123.3 ttl pk-yrs)    Types: Cigarettes    Start date: 01/09/1961   Smokeless tobacco: Never   Tobacco comments:    tobacco info given  Vaping Use   Vaping status: Never Used  Substance and Sexual Activity   Alcohol use:  Not Currently   Drug use: No   Sexual activity: Not on file  Other Topics Concern   Not on file  Social History Narrative   Not on file   Social Determinants of Health   Financial Resource Strain: Not on file  Food Insecurity: Not on file  Transportation Needs: Not on file  Physical Activity: Not on file  Stress: Not on file  Social Connections: Not on file  Intimate Partner Violence: Not on file     Physical Exam   Vitals:   08/31/22 2257 09/01/22 0030  BP: 101/65 110/77  Pulse: 92 89  Resp: 18 19  Temp:    SpO2: 95% 94%    CONSTITUTIONAL: Well-appearing, NAD NEURO/PSYCH:  Alert and oriented x 3, normal and symmetric strength and sensation, normal coordination, slurred speech EYES:  eyes equal and reactive ENT/NECK:  no LAD, no JVD CARDIO: Regular rate, well-perfused, normal S1 and S2 PULM:  CTAB no wheezing or rhonchi GI/GU:  non-distended, non-tender MSK/SPINE:  No gross deformities, no edema SKIN:  no rash, atraumatic   *Additional and/or pertinent findings included in MDM below  Diagnostic and Interventional Summary    EKG Interpretation Date/Time:  Thursday August 31 2022 23:31:27 EDT Ventricular Rate:  94 PR Interval:    QRS Duration:  123 QT Interval:  380 QTC Calculation: 476 R Axis:   -84  Text Interpretation: Atrial fibrillation Nonspecific IVCD with LAD Confirmed by Kennis Carina [phone]) on 09/01/2022 1:28:34 AM       Labs Reviewed  CBC - Abnormal; Notable for the following components:      Result Value   Platelets 118 (*)    All other components within normal limits  COMPREHENSIVE METABOLIC PANEL - Abnormal; Notable for the following components:   Glucose, Bld 102 (*)    Calcium 8.7 (*)    Total Protein 5.9 (*)    Albumin 3.2 (*)    All other components within normal limits  I-STAT CHEM 8, ED - Abnormal; Notable for the following components:   Calcium, Ion 1.11 (*)    All other components within normal limits  ETHANOL  PROTIME-INR   APTT  DIFFERENTIAL  RAPID URINE DRUG SCREEN, HOSP PERFORMED  URINALYSIS, ROUTINE W REFLEX MICROSCOPIC    CT ANGIO HEAD NECK W WO CM  Final Result    MR BRAIN WO CONTRAST  Final Result      Medications  iohexol (OMNIPAQUE) 350 MG/ML injection 75 mL (75 mLs Intravenous Contrast Given 09/01/22 0141)  ondansetron (ZOFRAN) injection 4 mg (4 mg Intravenous Given 09/01/22 0234)     Procedures  /  Critical Care Procedures  ED Course and Medical Decision Making  Initial  Impression and Ddx The history is concerning for possible stroke.  Polypharmacy is a consideration but 300 mg of gabapentin taken over 24 hours ago would not explain patient's continued symptoms.  Will need full stroke evaluation.  Past medical/surgical history that increases complexity of ED encounter: History of stroke  Interpretation of Diagnostics I personally reviewed the EKG and my interpretation is as follows: A-fib  Labs without significant blood count or electrolyte disturbance  Patient Reassessment and Ultimate Disposition/Management     CT and MRI imaging are normal.  Patient's speech is really only mildly slurred and I am not sure of patient's baseline.  He does not have any word finding issues here in the emergency department.  His coordination is intact on my exam.  Unclear cause, possibly medication related.  No emergent process appropriate for discharge.  Patient management required discussion with the following services or consulting groups:  None  Complexity of Problems Addressed Acute illness or injury that poses threat of life of bodily function  Additional Data Reviewed and Analyzed Further history obtained from: Prior labs/imaging results  Additional Factors Impacting ED Encounter Risk None  Elmer Sow. Pilar Plate, MD [facility] Emergency Medicine [facility] [email or web address]  Final Clinical Impressions(s) / ED Diagnoses     ICD-10-CM   1. Speech disturbance,  unspecified type  R47.9       ED Discharge Orders     None        Discharge Instructions Discussed with and Provided to Patient:     Discharge Instructions      You were evaluated in the Emergency Department and after careful evaluation, we did not find any emergent condition requiring admission or further testing in the hospital.  Your exam/testing today was overall reassuring.  No signs of stroke or any other emergencies.  Follow-up with your regular doctor to discuss your symptoms.  Please return to the Emergency Department if you experience any worsening of your condition.  Thank you for allowing [place] to be a part of your care.        Sabas Sous, MD 09/01/22 [id number]

## 2022-09-10 NOTE — Progress Notes (Signed)
Michael M. Bero, MD [place] [facility] [email or web address]  

## 2022-09-13 ENCOUNTER — Telehealth: Payer: Self-pay

## 2022-09-13 NOTE — Telephone Encounter (Signed)
Transition Care Management Follow-up Telephone Call Date of discharge and from where: [facility] 8/23 How have you been since you were released from the hospital? Patient stated he is doing fine and he has a new provider and wont be doing any business with [facility] anymore and declined the rest of the call Any questions or concerns?   Items Reviewed: Did the pt receive and understand the discharge instructions provided?  Medications obtained and verified?  Other?  Any new allergies since your discharge?  Dietary orders reviewed?  Do you have support at home?     Follow up appointments reviewed:  PCP Hospital f/u appt confirmed?  Scheduled to see  on  @ . Specialist Hospital f/u appt confirmed?  Scheduled to see  on  @ . Are transportation arrangements needed?  If their condition worsens, is the pt aware to call PCP or go to the Emergency Dept.?  Was the patient provided with contact information for the PCP's office or ED?  Was to pt encouraged to call back with questions or concerns?

## 2022-10-29 ENCOUNTER — Emergency Department (HOSPITAL_COMMUNITY): Payer: Medicare HMO

## 2022-10-29 ENCOUNTER — Encounter (HOSPITAL_COMMUNITY): Payer: Self-pay

## 2022-10-29 ENCOUNTER — Emergency Department (HOSPITAL_COMMUNITY)
Admission: EM | Admit: 2022-10-29 | Discharge: 2022-10-30 | Disposition: A | Discharge status: Home / Self Care | Payer: Medicare HMO | Attending: Emergency Medicine | Admitting: Emergency Medicine

## 2022-10-29 ENCOUNTER — Other Ambulatory Visit: Payer: Self-pay

## 2022-10-29 DIAGNOSIS — R197 Diarrhea, unspecified: Secondary | ICD-10-CM | POA: Insufficient documentation

## 2022-10-29 DIAGNOSIS — Z7901 Long term (current) use of anticoagulants: Secondary | ICD-10-CM | POA: Diagnosis not present

## 2022-10-29 DIAGNOSIS — Z8673 Personal history of transient ischemic attack (TIA), and cerebral infarction without residual deficits: Secondary | ICD-10-CM | POA: Insufficient documentation

## 2022-10-29 DIAGNOSIS — R110 Nausea: Secondary | ICD-10-CM | POA: Insufficient documentation

## 2022-10-29 DIAGNOSIS — R1084 Generalized abdominal pain: Secondary | ICD-10-CM | POA: Diagnosis not present

## 2022-10-29 DIAGNOSIS — I2510 Atherosclerotic heart disease of native coronary artery without angina pectoris: Secondary | ICD-10-CM | POA: Diagnosis not present

## 2022-10-29 DIAGNOSIS — Z7982 Long term (current) use of aspirin: Secondary | ICD-10-CM | POA: Diagnosis not present

## 2022-10-29 DIAGNOSIS — J449 Chronic obstructive pulmonary disease, unspecified: Secondary | ICD-10-CM | POA: Insufficient documentation

## 2022-10-29 DIAGNOSIS — I4891 Unspecified atrial fibrillation: Secondary | ICD-10-CM | POA: Insufficient documentation

## 2022-10-29 LAB — TYPE AND SCREEN
ABO/RH(D): AB POS
Antibody Screen: NEGATIVE

## 2022-10-29 LAB — CBC WITH DIFFERENTIAL/PLATELET
Abs Immature Granulocytes: 0.03 10*3/uL (ref 0.00–0.07)
Basophils Absolute: 0.1 10*3/uL (ref 0.0–0.1)
Basophils Relative: 1 %
Eosinophils Absolute: 0.2 10*3/uL (ref 0.0–0.5)
Eosinophils Relative: 2 %
HCT: 47.1 % (ref 39.0–52.0)
Hemoglobin: 15.6 g/dL (ref 13.0–17.0)
Immature Granulocytes: 0 %
Lymphocytes Relative: 24 %
Lymphs Abs: 1.9 10*3/uL (ref 0.7–4.0)
MCH: 28.9 pg (ref 26.0–34.0)
MCHC: 33.1 g/dL (ref 30.0–36.0)
MCV: 87.2 fL (ref 80.0–100.0)
Monocytes Absolute: 0.4 10*3/uL (ref 0.1–1.0)
Monocytes Relative: 6 %
Neutro Abs: 5.3 10*3/uL (ref 1.7–7.7)
Neutrophils Relative %: 67 %
Platelets: 113 10*3/uL — ABNORMAL LOW (ref 150–400)
RBC: 5.4 MIL/uL (ref 4.22–5.81)
RDW: 13.2 % (ref 11.5–15.5)
WBC: 7.9 10*3/uL (ref 4.0–10.5)
nRBC: 0 % (ref 0.0–0.2)

## 2022-10-29 LAB — POC OCCULT BLOOD, ED: Fecal Occult Bld: NEGATIVE

## 2022-10-29 LAB — COMPREHENSIVE METABOLIC PANEL
ALT: 12 U/L (ref 0–44)
AST: 16 U/L (ref 15–41)
Albumin: 3.3 g/dL — ABNORMAL LOW (ref 3.5–5.0)
Alkaline Phosphatase: 86 U/L (ref 38–126)
Anion gap: 7 (ref 5–15)
BUN: 10 mg/dL (ref 8–23)
CO2: 24 mmol/L (ref 22–32)
Calcium: 7.9 mg/dL — ABNORMAL LOW (ref 8.9–10.3)
Chloride: 108 mmol/L (ref 98–111)
Creatinine, Ser: 1.01 mg/dL (ref 0.61–1.24)
GFR, Estimated: >60 mL/min (ref >60)
Glucose, Bld: 119 mg/dL — ABNORMAL HIGH (ref 70–99)
Potassium: 3.7 mmol/L (ref 3.5–5.1)
Sodium: 139 mmol/L (ref 135–145)
Total Bilirubin: 1.1 mg/dL (ref 0.3–1.2)
Total Protein: 5.6 g/dL — ABNORMAL LOW (ref 6.5–8.1)

## 2022-10-29 LAB — ETHANOL: Alcohol, Ethyl (B): <10 mg/dL (ref <10)

## 2022-10-29 LAB — LIPASE, BLOOD: Lipase: 46 U/L (ref 11–51)

## 2022-10-29 LAB — I-STAT CG4 LACTIC ACID, ED: Lactic Acid, Venous: 1.5 mmol/L (ref 0.5–1.9)

## 2022-10-29 MED ORDER — IOHEXOL 300 MG/ML  SOLN
100.0000 mL | Freq: Once | INTRAMUSCULAR | Status: AC | PRN
  Administered 2022-10-29: 100 mL via INTRAVENOUS

## 2022-10-29 MED ORDER — SODIUM CHLORIDE 0.9 % IV BOLUS
500.0000 mL | Freq: Once | INTRAVENOUS | Status: AC
  Administered 2022-10-29: 500 mL via INTRAVENOUS

## 2022-10-29 MED ORDER — ONDANSETRON HCL 4 MG/2ML IJ SOLN
4.0000 mg | Freq: Once | INTRAMUSCULAR | Status: AC
  Administered 2022-10-29: 4 mg via INTRAVENOUS
  Filled 2022-10-29: qty 2

## 2022-10-29 NOTE — ED Triage Notes (Signed)
Pt BIB EMS from home for nausea and black diarrhea. Pt has no other complaints or abdominal pain. Pt uses eliquis but has not had his meds today.  109/60 110--170 A-fib 96% RA 92 cbg  18g Left forearm

## 2022-10-29 NOTE — ED Provider Notes (Signed)
[place] EMERGENCY DEPARTMENT AT [facility] Provider Note   CSN: [id number] Arrival date & time: 10/29/22  2111     History  Chief Complaint  Patient presents with   Melena   Nausea    David Hendricks is a 72 y.o. male.  72 year old male with prior medical history as detailed below presents for evaluation.  Patient reports nausea and diffuse abdominal cramps with associated dark tarry stool times approximately 12 hours.  Patient denies pain but reports abdominal cramps.    Patient is on Eliquis for chronic A-fib.  Patient denies prior history of GI bleeding.  Patient without vomiting.  Patient denies any recent alcohol use.  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   The history is provided by the patient and medical records.       Home Medications Prior to Admission medications   Medication Sig Start Date End Date Taking? Authorizing Provider  acetaminophen (TYLENOL) 500 MG tablet Take 1,000 mg by mouth every 6 (six) hours as needed for mild pain.    [provider]  apixaban (ELIQUIS) 5 MG TABS tablet Take 1 tablet (5 mg total) by mouth 2 (two) times daily. 02/12/22   Osvaldo Shipper, MD  aspirin EC 81 MG tablet Take 1 tablet (81 mg total) by mouth daily. Swallow whole. 04/27/20   Lanier Prude, MD  atorvastatin (LIPITOR) 80 MG tablet Take 1 tablet (80 mg total) by mouth daily. 02/12/22   Osvaldo Shipper, MD  Blood Pressure Monitoring (COMFORT TOUCH BP CUFF/LARGE) MISC 1 Device by Other route 2 (two) times daily. Use to check Blood Pressure twice daily. Dx: I10 02/22/21   Frederica Kuster, MD  cyanocobalamin (VITAMIN B12) 1000 MCG tablet Take 1 tablet (1,000 mcg total) by mouth daily. 02/12/22   Osvaldo Shipper, MD  methimazole (TAPAZOLE) 5 MG tablet Take 1 tablet (5 mg total) by mouth daily. TAKE 1 TABLET(5 MG) BY MOUTH DAILY 02/12/22   Osvaldo Shipper, MD  tamsulosin (FLOMAX) 0.4 MG CAPS capsule TAKE 2 CAPSULES BY MOUTH EVERY DAY 03/07/22   Frederica Kuster, MD  traZODone (DESYREL) 100 MG tablet TAKE 1 TABLET BY MOUTH AT BEDTIME 05/29/22   Frederica Kuster, MD      Allergies    Patient has no known allergies.    Review of Systems   Review of Systems  All other systems reviewed and are negative.   Physical Exam Updated Vital Signs BP 138/89   Pulse 92   Temp 98.2 F (36.8 C) (Oral)   Resp 15   SpO2 100%  Physical Exam Vitals and nursing note reviewed.  Constitutional:      General: He is not in acute distress.    Appearance: Normal appearance. He is well-developed.  HENT:     Head: Normocephalic and atraumatic.  Eyes:     Conjunctiva/sclera: Conjunctivae normal.     Pupils: Pupils are equal, round, and reactive to light.  Cardiovascular:     Rate and Rhythm: Normal rate. Rhythm irregular.     Heart sounds: Normal heart sounds.  Pulmonary:     Effort: Pulmonary effort is normal. No respiratory distress.     Breath sounds: Normal breath sounds.  Abdominal:     General: There is no distension.     Palpations: Abdomen is soft.     Tenderness: There is abdominal tenderness.     Comments: Mild diffuse crampy discomfort elicited with palpation  Genitourinary:    Comments: Thomas Hoff  black loose stool present on DRE Musculoskeletal:        General: No deformity. Normal range of motion.     Cervical back: Normal range of motion and neck supple.  Skin:    General: Skin is warm and dry.  Neurological:     General: No focal deficit present.     Mental Status: He is alert and oriented to person, place, and time.     ED Results / Procedures / Treatments   Labs (all labs ordered are listed, but only abnormal results are displayed) Labs Reviewed  CBC WITH DIFFERENTIAL/PLATELET  COMPREHENSIVE METABOLIC PANEL  LIPASE, BLOOD  ETHANOL  URINALYSIS, W/ REFLEX TO CULTURE (INFECTION SUSPECTED)  POC OCCULT BLOOD, ED  I-STAT CG4 LACTIC ACID, ED  TYPE AND SCREEN    EKG None  Radiology No results  found.  Procedures Procedures    Medications Ordered in ED Medications  sodium chloride 0.9 % bolus 500 mL (has no administration in time range)  ondansetron (ZOFRAN) injection 4 mg (has no administration in time range)    ED Course/ Medical Decision Making/ A&P                                 Medical Decision Making Amount and/or Complexity of Data Reviewed Labs: ordered. Radiology: ordered.  Risk Prescription drug management.    Medical Screen Complete  This patient presented to the ED with complaint of diarrhea, abdominal cramps.  This complaint involves an extensive number of treatment options. The initial differential diagnosis includes, but is not limited to, GI bleeding, metabolic abnormality,   This presentation is: Acute, Chronic, Self-Limited, Previously Undiagnosed, Uncertain Prognosis, Complicated, Systemic Symptoms, and Threat to Life/Bodily Function  Patient with history of COPD, CAD, A-fib on Eliquis, GERD, CVA presents with complaint of dark, loose diarrheal stool.  This is associate with diffuse abdominal cramping discomfort.  Symptoms began today.  Patient with nausea but no vomiting.  DRE reveals greenish darker colored diarrheal stool.  Guaiac negative.  Screening labs ordered and pending.  CT imaging ordered and pending.  Oncoming EDP (Palumbo) aware of case.  Additional history obtained: External records from outside sources obtained and reviewed including prior ED visits and prior Inpatient records.   Problem List / ED Course:  Diarrhea   Reevaluation:  After the interventions noted above, I reevaluated the patient and found that they have: improved    Disposition:  After consideration of the diagnostic results and the patients response to treatment, I feel that the patent would benefit from completion of ED evaluation.          Final Clinical Impression(s) / ED Diagnoses Final diagnoses:  Diarrhea, unspecified type     Rx / DC Orders ED Discharge Orders     None         Wynetta Fines, MD 11/03/22 [phone]

## 2022-11-06 ENCOUNTER — Emergency Department (HOSPITAL_COMMUNITY): Payer: Medicare HMO

## 2022-11-06 ENCOUNTER — Encounter (HOSPITAL_COMMUNITY): Payer: Self-pay

## 2022-11-06 ENCOUNTER — Other Ambulatory Visit: Payer: Self-pay

## 2022-11-06 ENCOUNTER — Emergency Department (HOSPITAL_COMMUNITY)
Admission: EM | Admit: 2022-11-06 | Discharge: 2022-11-06 | Disposition: A | Discharge status: Home / Self Care | Payer: Medicare HMO | Attending: Emergency Medicine | Admitting: Emergency Medicine

## 2022-11-06 DIAGNOSIS — Z7982 Long term (current) use of aspirin: Secondary | ICD-10-CM | POA: Diagnosis not present

## 2022-11-06 DIAGNOSIS — Z7901 Long term (current) use of anticoagulants: Secondary | ICD-10-CM | POA: Diagnosis not present

## 2022-11-06 DIAGNOSIS — Z79899 Other long term (current) drug therapy: Secondary | ICD-10-CM | POA: Insufficient documentation

## 2022-11-06 DIAGNOSIS — I639 Cerebral infarction, unspecified: Secondary | ICD-10-CM | POA: Diagnosis not present

## 2022-11-06 DIAGNOSIS — R202 Paresthesia of skin: Secondary | ICD-10-CM | POA: Diagnosis present

## 2022-11-06 LAB — RAPID URINE DRUG SCREEN, HOSP PERFORMED
Amphetamines: NOT DETECTED
Barbiturates: NOT DETECTED
Benzodiazepines: NOT DETECTED
Cocaine: NOT DETECTED
Opiates: NOT DETECTED
Tetrahydrocannabinol: NOT DETECTED

## 2022-11-06 LAB — CBC
HCT: 48.2 % (ref 39.0–52.0)
Hemoglobin: 16.1 g/dL (ref 13.0–17.0)
MCH: 29 pg (ref 26.0–34.0)
MCHC: 33.4 g/dL (ref 30.0–36.0)
MCV: 86.7 fL (ref 80.0–100.0)
Platelets: 122 10*3/uL — ABNORMAL LOW (ref 150–400)
RBC: 5.56 MIL/uL (ref 4.22–5.81)
RDW: 13.2 % (ref 11.5–15.5)
WBC: 9 10*3/uL (ref 4.0–10.5)
nRBC: 0 % (ref 0.0–0.2)

## 2022-11-06 LAB — CBG MONITORING, ED: Glucose-Capillary: 103 mg/dL — ABNORMAL HIGH (ref 70–99)

## 2022-11-06 LAB — DIFFERENTIAL
Abs Immature Granulocytes: 0.02 10*3/uL (ref 0.00–0.07)
Basophils Absolute: 0.1 10*3/uL (ref 0.0–0.1)
Basophils Relative: 1 %
Eosinophils Absolute: 0.4 10*3/uL (ref 0.0–0.5)
Eosinophils Relative: 4 %
Immature Granulocytes: 0 %
Lymphocytes Relative: 27 %
Lymphs Abs: 2.4 10*3/uL (ref 0.7–4.0)
Monocytes Absolute: 0.5 10*3/uL (ref 0.1–1.0)
Monocytes Relative: 6 %
Neutro Abs: 5.6 10*3/uL (ref 1.7–7.7)
Neutrophils Relative %: 62 %

## 2022-11-06 LAB — URINALYSIS, ROUTINE W REFLEX MICROSCOPIC
Bilirubin Urine: NEGATIVE
Glucose, UA: NEGATIVE mg/dL
Hgb urine dipstick: NEGATIVE
Ketones, ur: NEGATIVE mg/dL
Leukocytes,Ua: NEGATIVE
Nitrite: NEGATIVE
Protein, ur: NEGATIVE mg/dL
Specific Gravity, Urine: 1.009 (ref 1.005–1.030)
pH: 6 (ref 5.0–8.0)

## 2022-11-06 LAB — COMPREHENSIVE METABOLIC PANEL
ALT: 14 U/L (ref 0–44)
AST: 16 U/L (ref 15–41)
Albumin: 3.4 g/dL — ABNORMAL LOW (ref 3.5–5.0)
Alkaline Phosphatase: 93 U/L (ref 38–126)
Anion gap: 6 (ref 5–15)
BUN: 11 mg/dL (ref 8–23)
CO2: 28 mmol/L (ref 22–32)
Calcium: 8.6 mg/dL — ABNORMAL LOW (ref 8.9–10.3)
Chloride: 104 mmol/L (ref 98–111)
Creatinine, Ser: 0.95 mg/dL (ref 0.61–1.24)
GFR, Estimated: >60 mL/min (ref >60)
Glucose, Bld: 95 mg/dL (ref 70–99)
Potassium: 4 mmol/L (ref 3.5–5.1)
Sodium: 138 mmol/L (ref 135–145)
Total Bilirubin: 0.9 mg/dL (ref 0.3–1.2)
Total Protein: 6.1 g/dL — ABNORMAL LOW (ref 6.5–8.1)

## 2022-11-06 LAB — APTT: aPTT: 28 s (ref 24–36)

## 2022-11-06 LAB — PROTIME-INR
INR: 1 (ref 0.8–1.2)
Prothrombin Time: 13.2 s (ref 11.4–15.2)

## 2022-11-06 LAB — ETHANOL: Alcohol, Ethyl (B): <10 mg/dL (ref <10)

## 2022-11-06 MED ORDER — SODIUM CHLORIDE (PF) 0.9 % IJ SOLN
INTRAMUSCULAR | Status: AC
  Filled 2022-11-06: qty 50

## 2022-11-06 MED ORDER — IOHEXOL 350 MG/ML SOLN
75.0000 mL | Freq: Once | INTRAVENOUS | Status: AC | PRN
  Administered 2022-11-06: 75 mL via INTRAVENOUS

## 2022-11-06 NOTE — Discharge Instructions (Signed)
Please make sure you are taking your low-dose aspirin and your Eliquis every day as prescribed.  Follow-up with your neurologist as is scheduled.

## 2022-11-06 NOTE — ED Notes (Signed)
Dr. Oletta Cohn requested to evaluate pt at this time for possible stroke. Per physician NO code stroke activation at this time.

## 2022-11-06 NOTE — ED Provider Notes (Signed)
[place] EMERGENCY DEPARTMENT AT [facility] Provider Note   CSN: [id number] Arrival date & time: 11/06/22  0051     History  Chief Complaint  Patient presents with   Extremity Weakness    David Hendricks is a 72 y.o. male.  Patient presents to the emergency department for evaluation of left-sided weakness.  Patient reports prior stroke with left-sided deficit.  He went to bed around 9 PM tonight feeling like his normal self.  He woke up this morning feeling like his left arm is weak and numb.       Home Medications Prior to Admission medications   Medication Sig Start Date End Date Taking? Authorizing Provider  acetaminophen (TYLENOL) 500 MG tablet Take 1,000 mg by mouth every 6 (six) hours as needed for mild pain.    [provider]  apixaban (ELIQUIS) 5 MG TABS tablet Take 1 tablet (5 mg total) by mouth 2 (two) times daily. 02/12/22   Osvaldo Shipper, MD  aspirin EC 81 MG tablet Take 1 tablet (81 mg total) by mouth daily. Swallow whole. 04/27/20   Lanier Prude, MD  atorvastatin (LIPITOR) 80 MG tablet Take 1 tablet (80 mg total) by mouth daily. 02/12/22   Osvaldo Shipper, MD  Blood Pressure Monitoring (COMFORT TOUCH BP CUFF/LARGE) MISC 1 Device by Other route 2 (two) times daily. Use to check Blood Pressure twice daily. Dx: I10 02/22/21   Frederica Kuster, MD  cyanocobalamin (VITAMIN B12) 1000 MCG tablet Take 1 tablet (1,000 mcg total) by mouth daily. 02/12/22   Osvaldo Shipper, MD  methimazole (TAPAZOLE) 5 MG tablet Take 1 tablet (5 mg total) by mouth daily. TAKE 1 TABLET(5 MG) BY MOUTH DAILY 02/12/22   Osvaldo Shipper, MD  tamsulosin (FLOMAX) 0.4 MG CAPS capsule TAKE 2 CAPSULES BY MOUTH EVERY DAY 03/07/22   Frederica Kuster, MD  traZODone (DESYREL) 100 MG tablet TAKE 1 TABLET BY MOUTH AT BEDTIME 05/29/22   Frederica Kuster, MD      Allergies    Patient has no known allergies.    Review of Systems   Review of Systems  Physical Exam Updated Vital Signs BP  111/68   Pulse 77   Temp 97.6 F (36.4 C) (Oral)   Resp 18   SpO2 97%  Physical Exam Vitals and nursing note reviewed.  Constitutional:      General: He is not in acute distress.    Appearance: He is well-developed.  HENT:     Head: Normocephalic and atraumatic.     Mouth/Throat:     Mouth: Mucous membranes are moist.  Eyes:     General: Vision grossly intact. Gaze aligned appropriately.     Extraocular Movements: Extraocular movements intact.     Conjunctiva/sclera: Conjunctivae normal.  Cardiovascular:     Rate and Rhythm: Normal rate and regular rhythm.     Pulses: Normal pulses.     Heart sounds: Normal heart sounds, S1 normal and S2 normal. No murmur heard.    No friction rub. No gallop.  Pulmonary:     Effort: Pulmonary effort is normal. No respiratory distress.     Breath sounds: Normal breath sounds.  Abdominal:     Palpations: Abdomen is soft.     Tenderness: There is no abdominal tenderness. There is no guarding or rebound.     Hernia: No hernia is present.  Musculoskeletal:        General: No swelling.     Cervical back: Full passive  range of motion without pain, normal range of motion and neck supple. No pain with movement, spinous process tenderness or muscular tenderness. Normal range of motion.     Right lower leg: No edema.     Left lower leg: No edema.  Skin:    General: Skin is warm and dry.     Capillary Refill: Capillary refill takes less than 2 seconds.     Findings: No ecchymosis, erythema, lesion or wound.  Neurological:     Mental Status: He is alert and oriented to person, place, and time.     GCS: GCS eye subscore is 4. GCS verbal subscore is 5. GCS motor subscore is 6.     Cranial Nerves: Facial asymmetry (Left) present.     Sensory: Sensory deficit (Subjective numbness of left arm) present.     Motor: Weakness (Slight droop of left upper and left lower extremity) present. No abnormal muscle tone.     Coordination: Coordination is intact.   Psychiatric:        Mood and Affect: Mood normal.        Speech: Speech normal.        Behavior: Behavior normal.     ED Results / Procedures / Treatments   Labs (all labs ordered are listed, but only abnormal results are displayed) Labs Reviewed  CBC - Abnormal; Notable for the following components:      Result Value   Platelets 122 (*)    All other components within normal limits  COMPREHENSIVE METABOLIC PANEL - Abnormal; Notable for the following components:   Calcium 8.6 (*)    Total Protein 6.1 (*)    Albumin 3.4 (*)    All other components within normal limits  CBG MONITORING, ED - Abnormal; Notable for the following components:   Glucose-Capillary 103 (*)    All other components within normal limits  ETHANOL  PROTIME-INR  APTT  DIFFERENTIAL  RAPID URINE DRUG SCREEN, HOSP PERFORMED  URINALYSIS, ROUTINE W REFLEX MICROSCOPIC    EKG EKG Interpretation Date/Time:  Monday November 06 2022 01:13:30 EDT Ventricular Rate:  93 PR Interval:    QRS Duration:  115 QT Interval:  385 QTC Calculation: 479 R Axis:   -80  Text Interpretation: Atrial fibrillation Incomplete RBBB and LAFB Consider anterior infarct Confirmed by Gilda Crease [phone]) on 11/06/2022 3:10:29 AM  Radiology MR BRAIN WO CONTRAST  Result Date: 11/06/2022 CLINICAL DATA:  72 year old male with neurologic deficit. Left side deficit. Prior strokes. EXAM: MRI HEAD WITHOUT CONTRAST TECHNIQUE: Multiplanar, multiecho pulse sequences of the brain and surrounding structures were obtained without intravenous contrast. COMPARISON:  Head CT 0205 hours.  Brain MRI 09/01/2022 and earlier. FINDINGS: Brain: Clustered small subcentimeter foci of cortical restricted diffusion in the anterior right middle frontal gyrus (series 5, image 37). Minimal T2 and FLAIR hyperintensity there, no hemorrhage or mass effect. Superimposed small areas of cortical encephalomalacia in the posterior right MCA territory (series 9, image  43). Contralateral chronic left anterior MCA division cortical infarcts, some with hemosiderin (series 10, image 46). Chronic small left cerebellar infarcts. And nonspecific appearance of chronic right cerebellar volume loss. Small chronic infarcts in the bilateral deep gray matter nuclei, and chronic widespread patchy bilateral cerebral white matter T2 and FLAIR hyperintensity. No other restricted diffusion identified. No midline shift, mass effect, evidence of mass lesion, ventriculomegaly, extra-axial collection or acute intracranial hemorrhage. Cervicomedullary junction and pituitary are within normal limits. Vascular: Major intracranial vascular flow voids are stable. Dominant distal left  vertebral artery. Skull and upper cervical spine: Stable, negative. Sinuses/Orbits: Stable, negative. Other: Visible internal auditory structures appear normal. Mastoids are clear. Negative visible scalp and face. IMPRESSION: 1. Small cluster of subcentimeter acute cortical infarcts in the anterior right middle frontal gyrus. No acute hemorrhage or mass effect. 2. Underlying fairly extensive chronic small and medium-sized vessel ischemia in both cerebral hemispheres, the left cerebellum. Electronically Signed   By: Odessa Fleming M.D.   On: 11/06/2022 04:35   CT HEAD WO CONTRAST  Result Date: 11/06/2022 CLINICAL DATA:  Left arm numbness and tingling. Woke up from sleep like this. EXAM: CT HEAD WITHOUT CONTRAST TECHNIQUE: Contiguous axial images were obtained from the base of the skull through the vertex without intravenous contrast. RADIATION DOSE REDUCTION: This exam was performed according to the departmental dose-optimization program which includes automated exposure control, adjustment of the mA and/or kV according to patient size and/or use of iterative reconstruction technique. COMPARISON:  CTA head and neck 09/01/2022; MRI head 09/01/2022 FINDINGS: Brain: No intracranial hemorrhage, mass effect, or evidence of acute  infarct. No hydrocephalus. No extra-axial fluid collection. Age-commensurate cerebral atrophy and chronic small vessel ischemic disease. Chronic left anterior frontal lobe infarct and right cerebellar infarct. Vascular: No hyperdense vessel. Intracranial arterial calcification. Skull: No fracture or focal lesion. Sinuses/Orbits: No acute finding. Other: None. IMPRESSION: 1. No acute intracranial abnormality. Electronically Signed   By: Minerva Fester M.D.   On: 11/06/2022 02:27    Procedures Procedures    Medications Ordered in ED Medications - No data to display  ED Course/ Medical Decision Making/ A&P                                 Medical Decision Making Amount and/or Complexity of Data Reviewed Labs: ordered. Radiology: ordered.  Risk Prescription drug management. Decision regarding hospitalization.   Presents to the emergency department with left-sided numbness and weakness.  TIA versus stroke considered.  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  Patient presents after waking up with increased deficit.  Examination does reveal some slight motor and sensory deficit on the left, it is unclear what his baseline is.  Additionally he is on Eliquis not any evidence of a large vessel occlusion and therefore not a candidate for tPA and not a candidate for directed therapy.  No code stroke initiated based on this.  Stroke workup was, however, initiated.  Head CT did not show any acute abnormality.  Patient sent back to radiology for MRI which does show clusters of acute stroke.  Discussed with Dr. Derry Lory, on-call for neurologist.  Does not require repeat admission for the strokes.  He is maximally medically managed on aspirin and Eliquis.  He does, however, recommend CT angio head and neck.  If there is any significant stenosis, could consider stenting.  CT angio has been  performed.  Findings are stable from prior, no significant stenosis or other reversible process noted.  As per discussion with Dr. Derry Lory, can discharge, continue aspirin and Eliquis.  CRITICAL CARE Performed by: Gilda Crease   Total critical care time: 35 minutes  Critical care time was exclusive of separately billable procedures and treating other patients.  Critical care was necessary to treat or prevent imminent or life-threatening deterioration.  Critical care was time spent personally by me on the following activities: development of  treatment plan with patient and/or surrogate as well as nursing, discussions with consultants, evaluation of patient's response to treatment, examination of patient, obtaining history from patient or surrogate, ordering and performing treatments and interventions, ordering and review of laboratory studies, ordering and review of radiographic studies, pulse oximetry and re-evaluation of patient's condition.         Final Clinical Impression(s) / ED Diagnoses Final diagnoses:  Cerebrovascular accident (CVA), unspecified mechanism [facility])    Rx / DC Orders ED Discharge Orders     None         Joeph Szatkowski, Canary Brim, MD 11/06/22 [phone]

## 2022-11-06 NOTE — ED Triage Notes (Signed)
Pt presents via EMS c/o left arm numbness and tingling. Reports hx of herniated disc and pinched nerve. Reports woke up from sleep like this. Reports arm numbness improved since being awake. EMS report NIH 0. A&O x4. Ambulatory per triage.

## 2022-11-15 ENCOUNTER — Encounter: Payer: Self-pay | Admitting: Nurse Practitioner

## 2022-11-22 ENCOUNTER — Other Ambulatory Visit: Payer: Self-pay | Admitting: Nurse Practitioner

## 2022-11-22 DIAGNOSIS — G8929 Other chronic pain: Secondary | ICD-10-CM

## 2022-11-24 ENCOUNTER — Encounter: Payer: Self-pay | Admitting: Podiatry

## 2022-11-24 ENCOUNTER — Ambulatory Visit (INDEPENDENT_AMBULATORY_CARE_PROVIDER_SITE_OTHER): Payer: Medicare HMO | Admitting: Podiatry

## 2022-11-24 DIAGNOSIS — B351 Tinea unguium: Secondary | ICD-10-CM

## 2022-11-24 DIAGNOSIS — M79675 Pain in left toe(s): Secondary | ICD-10-CM | POA: Diagnosis not present

## 2022-11-24 DIAGNOSIS — I999 Unspecified disorder of circulatory system: Secondary | ICD-10-CM

## 2022-11-24 DIAGNOSIS — M79674 Pain in right toe(s): Secondary | ICD-10-CM

## 2022-11-26 NOTE — Progress Notes (Signed)
Subjective:   Patient ID: David Hendricks, male   DOB: 72 y.o.   MRN: [id number]   HPI Patient presents with painful nailbeds and ingrown nails that are hard for him to take care of.  He does smoke 2 packs of cigarettes a day and has been doing this for 61 years and is not in the best of overall health   Review of Systems  All other systems reviewed and are negative.       Objective:  Physical Exam Vitals and nursing note reviewed.  Constitutional:      Appearance: He is well-developed.  Pulmonary:     Effort: Pulmonary effort is normal.  Musculoskeletal:        General: Normal range of motion.  Skin:    General: Skin is warm.  Neurological:     Mental Status: He is alert.     Neurovascular status found to be intact muscle strength was found to be adequate range of motion is adequate with diminishment of DP PT pulses bilateral.  He has severe nail disease 1-5 both feet thickened and sore and impossible for him to cut     Assessment:  Vascular issues a long-term smoker and mycotic nail infection 1-5 both feet painful     Plan:  Reviewed all conditions debrided nailbeds 1-5 both feet no iatrogenic bleeding reappoint routine care

## 2022-11-28 ENCOUNTER — Other Ambulatory Visit (HOSPITAL_COMMUNITY): Payer: Self-pay | Admitting: Nurse Practitioner

## 2022-11-28 DIAGNOSIS — M545 Low back pain, unspecified: Secondary | ICD-10-CM

## 2022-12-06 ENCOUNTER — Ambulatory Visit (HOSPITAL_COMMUNITY): Payer: Medicare HMO

## 2022-12-06 ENCOUNTER — Encounter (HOSPITAL_COMMUNITY): Payer: Self-pay

## 2022-12-13 ENCOUNTER — Other Ambulatory Visit (HOSPITAL_COMMUNITY): Payer: Self-pay | Admitting: Nurse Practitioner

## 2022-12-13 ENCOUNTER — Ambulatory Visit (HOSPITAL_COMMUNITY)
Admission: RE | Admit: 2022-12-13 | Discharge: 2022-12-13 | Disposition: A | Discharge status: Home / Self Care | Payer: Medicare HMO | Source: Ambulatory Visit | Attending: Nurse Practitioner | Admitting: Nurse Practitioner

## 2022-12-13 DIAGNOSIS — M545 Low back pain, unspecified: Secondary | ICD-10-CM

## 2022-12-13 DIAGNOSIS — M25561 Pain in right knee: Secondary | ICD-10-CM

## 2023-01-27 ENCOUNTER — Emergency Department (HOSPITAL_COMMUNITY): Payer: Medicare HMO

## 2023-01-27 ENCOUNTER — Emergency Department (HOSPITAL_COMMUNITY)
Admission: EM | Admit: 2023-01-27 | Discharge: 2023-01-27 | Discharge status: Against Medical Advice | Payer: Medicare HMO | Attending: Emergency Medicine | Admitting: Emergency Medicine

## 2023-01-27 ENCOUNTER — Encounter (HOSPITAL_COMMUNITY): Payer: Self-pay | Admitting: Emergency Medicine

## 2023-01-27 ENCOUNTER — Other Ambulatory Visit: Payer: Self-pay

## 2023-01-27 DIAGNOSIS — M545 Low back pain, unspecified: Secondary | ICD-10-CM | POA: Diagnosis present

## 2023-01-27 DIAGNOSIS — G8929 Other chronic pain: Secondary | ICD-10-CM | POA: Insufficient documentation

## 2023-01-27 DIAGNOSIS — Z5329 Procedure and treatment not carried out because of patient's decision for other reasons: Secondary | ICD-10-CM | POA: Insufficient documentation

## 2023-01-27 NOTE — ED Provider Notes (Signed)
[facility]-EMERGENCY DEPT [facility] Emergency Department Provider Note MRN:  [id number]  Arrival date & time: 01/27/23     Chief Complaint   Back Pain   History of Present Illness   Shai Freudenthal is a 73 y.o. year-old male presents to the ED with chief complaint of chronic shoulder and low back pain.  The patient states that he has a known herniated disc, but when he had recent MRI, none was seen.  He states that he continues to have low back pain.  He also states that when he woke this morning his right arm was asleep.  He states that he was unable to move it, but he can move it fine now.  He is moving it fine when he went to sleep.  He has history of prior stroke.  He denies any recent illness.  He has been taking oxycodone without much relief.  History provided by patient.   Review of Systems  Pertinent positive and negative review of systems noted in HPI.    Physical Exam   Vitals:   01/27/23 0304  BP: 123/77  Pulse: 74  Resp: 16  Temp: 97.6 F (36.4 C)  SpO2: 95%    CONSTITUTIONAL:  non toxic-appearing, NAD NEURO:  Alert and oriented x 3, CN 3-12 grossly intact EYES:  eyes equal and reactive ENT/NECK:  Supple, no stridor  CARDIO:  normal rate, regular rhythm, appears well-perfused  PULM:  No respiratory distress, CTAB GI/GU:  non-distended,  MSK/SPINE:  No gross deformities, no edema, moves all extremities  SKIN:  no rash, atraumatic   *Additional and/or pertinent findings included in MDM below  Diagnostic and Interventional Summary    EKG Interpretation Date/Time:    Ventricular Rate:    PR Interval:    QRS Duration:    QT Interval:    QTC Calculation:   R Axis:      Text Interpretation:         Labs Reviewed  RESP PANEL BY RT-PCR (RSV, FLU A&B, COVID)  [id number]  ETHANOL  PROTIME-INR  APTT  CBC  DIFFERENTIAL  COMPREHENSIVE METABOLIC PANEL  RAPID URINE DRUG SCREEN, HOSP PERFORMED  URINALYSIS, ROUTINE W REFLEX MICROSCOPIC  I-STAT CHEM 8, ED     CT HEAD WO CONTRAST    (Results Pending)  DG Knee Complete 4 Views Right    (Results Pending)  DG Chest Port 1 View    (Results Pending)    Medications - No data to display   Procedures  /  Critical Care Procedures  ED Course and Medical Decision Making  I have reviewed the triage vital signs, the nursing notes, and pertinent available records from the EMR.  Social Determinants Affecting Complexity of Care: Patient has no clinically significant social determinants affecting this chief complaint..   ED Course:    Medical Decision Making Patient here with chronic pain in his shoulders and low back.   He had a recent MRI that did not show any herniated disc.  He states that he did get a good improvement from his regular oxycodone.  He states that he has had some increased extremity weakness.  He also states that he awoke this morning with right arm numbness, but this is resolved.  Given that he went to bed well, and awoke with the symptoms that have now resolved, will not activate code stroke.  Will check CT and imaging.  Patient may also need advanced imaging of his spine.  Amount and/or Complexity of Data Reviewed Labs:  ordered. Radiology: ordered.         Consultants:    Treatment and Plan: 5:51 AM I was notified by the RN that the patient eloped/ left AMA.    Final Clinical Impressions(s) / ED Diagnoses     ICD-10-CM   1. Other chronic pain  G89.29       ED Discharge Orders     None         Discharge Instructions Discussed with and Provided to Patient:   Discharge Instructions   None      Roxy Horseman, PA-C 01/27/23 0551    Palumbo, April, MD 01/27/23 [phone]

## 2023-01-27 NOTE — ED Notes (Signed)
Pt SpO2 continues to drop into low 80% range while sleeping. Pt denies wearing any O2 or C-PAP at home. Nurse placed pt on 2L [place], SpO2 came up to 94%.

## 2023-01-27 NOTE — ED Triage Notes (Signed)
Pt BIBA from home, c/o worsening back pain and generalized weakness x2wks, hx chronic back pain from herniated disc, came in d/t prescription home meds not touching pain. Last had oxycodone @ 2000. A&Ox4, does not endorse any other symptoms.

## 2023-01-27 NOTE — ED Notes (Signed)
Pt expresses desire to leave AMA, pt educated on risks of leaving AMA including worsening condition and death. Pt still expresses desire to leave. ED provided notified.

## 2023-09-28 ENCOUNTER — Emergency Department (HOSPITAL_COMMUNITY)
Admission: EM | Admit: 2023-09-28 | Discharge: 2023-09-29 | Discharge status: Against Medical Advice | Attending: Emergency Medicine | Admitting: Emergency Medicine

## 2023-09-28 ENCOUNTER — Other Ambulatory Visit: Payer: Self-pay

## 2023-09-28 ENCOUNTER — Encounter (HOSPITAL_COMMUNITY): Payer: Self-pay

## 2023-09-28 ENCOUNTER — Emergency Department (HOSPITAL_COMMUNITY)

## 2023-09-28 DIAGNOSIS — F17210 Nicotine dependence, cigarettes, uncomplicated: Secondary | ICD-10-CM | POA: Insufficient documentation

## 2023-09-28 DIAGNOSIS — Z5321 Procedure and treatment not carried out due to patient leaving prior to being seen by health care provider: Secondary | ICD-10-CM | POA: Diagnosis not present

## 2023-09-28 DIAGNOSIS — R0602 Shortness of breath: Secondary | ICD-10-CM | POA: Insufficient documentation

## 2023-09-28 LAB — BASIC METABOLIC PANEL WITH GFR
Anion gap: 13 (ref 5–15)
BUN: 13 mg/dL (ref 8–23)
CO2: 24 mmol/L (ref 22–32)
Calcium: 9.6 mg/dL (ref 8.9–10.3)
Chloride: 104 mmol/L (ref 98–111)
Creatinine, Ser: 0.92 mg/dL (ref 0.61–1.24)
GFR, Estimated: >60 mL/min (ref >60)
Glucose, Bld: 116 mg/dL — ABNORMAL HIGH (ref 70–99)
Potassium: 3.9 mmol/L (ref 3.5–5.1)
Sodium: 140 mmol/L (ref 135–145)

## 2023-09-28 LAB — CBC
HCT: 50.2 % (ref 39.0–52.0)
Hemoglobin: 16.6 g/dL (ref 13.0–17.0)
MCH: 28.4 pg (ref 26.0–34.0)
MCHC: 33.1 g/dL (ref 30.0–36.0)
MCV: 86 fL (ref 80.0–100.0)
Platelets: 117 K/uL — ABNORMAL LOW (ref 150–400)
RBC: 5.84 MIL/uL — ABNORMAL HIGH (ref 4.22–5.81)
RDW: 13.6 % (ref 11.5–15.5)
WBC: 9.8 K/uL (ref 4.0–10.5)
nRBC: 0 % (ref 0.0–0.2)

## 2023-09-28 NOTE — ED Triage Notes (Signed)
 PT bib [facility] for shortness of breath.Per EMS PT stated that he smoked a cigarette and started feeling short of breath and call 911

## 2024-01-13 NOTE — Progress Notes (Deleted)
 "   [facility] [phone]) [phone]   [phone]  CONSULT NOTE  REFERRING PHYSICIAN: Dr. Epimenio  REASON FOR CONSULTATION:  Erythrocytosis   HPI David Hendricks is a 74 y.o. male with a past medical history significant for hypertension, PAF, CAD, stroke, GERD, thyroid  dysfunction, BPH, and hyperlipidemia is referred to the clinic for erythrocytosis.  The patient had a follow-up with his PCP on 12/19/2023.  He has a history of erythrocytosis and thrombocytosis on prior blood work.  He the patient states that he had a prior sleep study in the past and did not show any sleep apnea.  It was thought that it could be secondary to his chronic smoking CO2 levels.  However he was referred to the clinic for further workup.  His blood work from 11/14/2023 showed elevated RBCs at 6.52, elevated hemoglobin 18.6, elevated hematocrit at 54.3.  His platelet count was slightly low at 133.  His iron studies at that show normal showed normal iron at 115, normal saturation at 32, normal iron binding capacity at 357.  Sleep apnea?  Testosterone?  He denies any known snoring, morning fatigue, or symptoms of disordered breathing.   The oldest labs available to me are from 2017. He did have some periods of normal Hbg while other periods of elevated Hbg on and off since 2017. The highest Hbg besides the most recent labs are from 2021 with Hbg 18.4 and HCT 55.4%   ***For plt count CT AP from 10/20.24 shows fatty liver disease. ***No bleeding complications from surgery    Overall the patient is feeling well.  He reports *** energy.  Denies any dyspnea, cyanosis, or cough.  Denies any*** lung disease.  Denies any current or prior smoking.  Denies*** any personal history of strokes or heart attacks.  He is on***Blood thinner   He denies chest pain, palpitations, lower extremity edema, and reports no dizziness except with rapid position changes. He has no gastrointestinal symptoms aside from  chronic abdominal bloating and fullness, without pain.  He denies history of alcohol  use and is not currently under gastroenterology care. ***Liver Disease.  He denies any hematuria or pelvic pain.  He denies any peripheral neuropathy.   HPI  Past Medical History:  Diagnosis Date   Aortic atherosclerosis    Arthritis    Benign prostatic hyperplasia (BPH) with urinary urgency    COPD (chronic obstructive pulmonary disease) (HCC)    Coronary artery disease    10/18 PCI/DES to pLAD, mild nonobstructive disease in the Lcx/RCA. Normal EF.    Depression with anxiety    Dyslipidemia    Dysrhythmia 2016   irregular heartbeat   Gallstones    GERD (gastroesophageal reflux disease)    History of kidney stones    Hyperthyroidism    Leg cramps    Multiple pulmonary nodules    Myocardial infarction (HCC)    2018   Pneumonia    Seasonal allergies    Stroke [facility])    Vitamin B 12 deficiency     Past Surgical History:  Procedure Laterality Date   COLONOSCOPY     CORONARY ANGIOPLASTY  10/09/2016   CORONARY STENT INTERVENTION N/A 10/09/2016   Procedure: CORONARY STENT INTERVENTION;  Surgeon: Mady Bruckner, MD;  Location: [facility];  Service: Cardiovascular;  Laterality: N/A;   CORONARY ULTRASOUND/IVUS N/A 10/09/2016   Procedure: Intravascular Ultrasound/IVUS;  Surgeon: Mady Bruckner, MD;  Location: [facility];  Service: Cardiovascular;  Laterality: N/A;   CYSTOSCOPY  WITH RETROGRADE PYELOGRAM, URETEROSCOPY AND STENT PLACEMENT Left 06/17/2019   Procedure: CYSTOSCOPY WITH RETROGRADE PYELOGRAM, URETEROSCOPY AND STENT PLACEMENT;  Surgeon: Elisabeth Valli BIRCH, MD;  Location: [facility];  Service: Urology;  Laterality: Left;  1 HR   HOLMIUM LASER APPLICATION Left 06/17/2019   Procedure: HOLMIUM LASER APPLICATION;  Surgeon: Elisabeth Valli BIRCH, MD;  Location: [facility];  Service: Urology;  Laterality: Left;   IR CHOLANGIOGRAM EXISTING TUBE  11/18/2018   IR PERC CHOLECYSTOSTOMY  11/16/2018   LEFT  HEART CATH AND CORONARY ANGIOGRAPHY N/A 10/09/2016   Procedure: LEFT HEART CATH AND CORONARY ANGIOGRAPHY;  Surgeon: Mady Bruckner, MD;  Location: [facility];  Service: Cardiovascular;  Laterality: N/A;   LOOP RECORDER INSERTION N/A 02/20/2020   Procedure: LOOP RECORDER INSERTION;  Surgeon: Cindie Ole DASEN, MD;  Location: [facility];  Service: Cardiovascular;  Laterality: N/A;    Family History  Problem Relation Age of Onset   Alcohol  abuse Father        called [facility] per pt   Cancer Father        [facility] PER PT NON SURGICAL    Colon cancer Neg Hx    Colon polyps Neg Hx    Esophageal cancer Neg Hx    Rectal cancer Neg Hx    Stomach cancer Neg Hx     Social History Social History[1]  Allergies[2]  Current Outpatient Medications  Medication Sig Dispense Refill   acetaminophen  (TYLENOL ) 500 MG tablet Take 1,000 mg by mouth every 6 (six) hours as needed for mild pain.     apixaban  (ELIQUIS ) 5 MG TABS tablet Take 1 tablet (5 mg total) by mouth 2 (two) times daily. 60 tablet 5   aspirin  EC 81 MG tablet Take 1 tablet (81 mg total) by mouth daily. Swallow whole. 90 tablet 3   atorvastatin  (LIPITOR ) 80 MG tablet Take 1 tablet (80 mg total) by mouth daily. 30 tablet 3   Blood Pressure Monitoring (COMFORT TOUCH BP CUFF/LARGE) MISC 1 Device by Other route 2 (two) times daily. Use to check Blood Pressure twice daily. Dx: I10 1 each 0   cyanocobalamin  (VITAMIN B12) 1000 MCG tablet Take 1 tablet (1,000 mcg total) by mouth daily. 30 tablet 3   methimazole  (TAPAZOLE ) 5 MG tablet Take 1 tablet (5 mg total) by mouth daily. TAKE 1 TABLET(5 MG) BY MOUTH DAILY 30 tablet 0   tamsulosin  (FLOMAX ) 0.4 MG CAPS capsule TAKE 2 CAPSULES BY MOUTH EVERY DAY 60 capsule 5   traZODone  (DESYREL ) 100 MG tablet TAKE 1 TABLET BY MOUTH AT BEDTIME 30 tablet 0   No current facility-administered medications for this visit.    REVIEW OF SYSTEMS:   Review of Systems  Constitutional:  Negative for appetite change, chills, fatigue, fever and unexpected weight change.  HENT:   Negative for mouth sores, nosebleeds, sore throat and trouble swallowing.   Eyes: Negative for eye problems and icterus.  Respiratory: Negative for cough, hemoptysis, shortness of breath and wheezing.   Cardiovascular: Negative for chest pain and leg swelling.  Gastrointestinal: Negative for abdominal pain, constipation, diarrhea, nausea and vomiting.  Genitourinary: Negative for bladder incontinence, difficulty urinating, dysuria, frequency and hematuria.   Musculoskeletal: Negative for back pain, gait problem, neck pain and neck stiffness.  Skin: Negative for itching and rash.  Neurological: Negative for dizziness, extremity weakness, gait problem, headaches, light-headedness and seizures.  Hematological: Negative for adenopathy. Does not bruise/bleed easily.  Psychiatric/Behavioral: Negative for confusion, depression  and sleep disturbance. The patient is not nervous/anxious.     PHYSICAL EXAMINATION:  There were no vitals taken for this visit.  ECOG PERFORMANCE STATUS: {CHL ONC ECOG [id number]  Physical Exam  Constitutional: Oriented to person, place, and time and well-developed, well-nourished, and in no distress. No distress.  HENT:  Head: Normocephalic and atraumatic.  Mouth/Throat: Oropharynx is clear and moist. No oropharyngeal exudate.  Eyes: Conjunctivae are normal. Right eye exhibits no discharge. Left eye exhibits no discharge. No scleral icterus.  Neck: Normal range of motion. Neck supple.  Cardiovascular: Normal rate, regular rhythm, normal heart sounds and intact distal pulses.   Pulmonary/Chest: Effort normal and breath sounds normal. No respiratory distress. No wheezes. No rales.  Abdominal: Soft. Bowel sounds are normal. Exhibits no distension and no mass. There is no tenderness.  Musculoskeletal: Normal range of motion. Exhibits no edema.  Lymphadenopathy:    No cervical  adenopathy.  Neurological: Alert and oriented to person, place, and time. Exhibits normal muscle tone. Gait normal. Coordination normal.  Skin: Skin is warm and dry. No rash noted. Not diaphoretic. No erythema. No pallor.  Psychiatric: Mood, memory and judgment normal.  Vitals reviewed.  LABORATORY DATA: Lab Results  Component Value Date   WBC 9.8 09/28/2023   HGB 16.6 09/28/2023   HCT 50.2 09/28/2023   MCV 86.0 09/28/2023   PLT 117 (L) 09/28/2023      Chemistry      Component Value Date/Time   NA 140 09/28/2023 2210   K 3.9 09/28/2023 2210   CL 104 09/28/2023 2210   CO2 24 09/28/2023 2210   BUN 13 09/28/2023 2210   CREATININE 0.92 09/28/2023 2210   CREATININE 1.14 04/13/2021 1441      Component Value Date/Time   CALCIUM  9.6 09/28/2023 2210   ALKPHOS 93 11/06/2022 0250   AST 16 11/06/2022 0250   ALT 14 11/06/2022 0250   BILITOT 0.9 11/06/2022 0250   BILITOT 0.6 12/04/2016 1029       RADIOGRAPHIC STUDIES: No results found.  ASSESSMENT: This is a very pleasant 74 year old Caucasian male referred to the clinic for polycythemia and mild thrombocytopenia.  The patient was seen with Dr. Sherrod today.  The patient several lab studies today performed including CBC, CMP, JAK2 mutation testing, iron studies, ferritin, EPO, and ***.  His mild thrombocytopenia could be secondary to his liver disease.  The patient's elevated hemoglobin could be secondary to COPD, and smoking.  However we will check his JAK2 mutation testing to ensure no myeloproliferative disorder.  Excessive vitamins?  Follow with GI/hepatology for his liver disease.  The patient voices understanding of current disease status and treatment options and is in agreement with the current care plan.  All questions were answered. The patient knows to call the clinic with any problems, questions or concerns. We can certainly see the patient much sooner if necessary.  Thank you so much for allowing me to  participate in the care of David Hendricks. I will continue to follow up the patient with you and assist in his care.  I spent {CHL ONC TIME VISIT - DTPQU:[id number]} counseling the patient face to face. The total time spent in the appointment was {CHL ONC TIME VISIT - DTPQU:[id number]}.  Disclaimer: This note was dictated with voice recognition software. Similar sounding words can inadvertently be transcribed and may not be corrected upon review.   David Hendricks L Jaking Thayer January 13, 2024, 9:47 AM       [1]  Social History Tobacco Use  Smoking status: Every Day    Current packs/day: 2.00    Average packs/day: 2.0 packs/day for 63.0 years (126.0 ttl pk-yrs)    Types: Cigarettes    Start date: 01/09/1961   Smokeless tobacco: Never   Tobacco comments:    tobacco info given  Vaping Use   Vaping status: Never Used  Substance Use Topics   Alcohol  use: Not Currently   Drug use: No  [2] No Known Allergies  "

## 2024-01-16 ENCOUNTER — Inpatient Hospital Stay: Admitting: Physician Assistant

## 2024-01-16 ENCOUNTER — Inpatient Hospital Stay

## 2024-01-16 ENCOUNTER — Emergency Department (HOSPITAL_COMMUNITY): Admission: EM | Admit: 2024-01-16 | Discharge: 2024-01-16 | Discharge status: Against Medical Advice

## 2024-01-16 ENCOUNTER — Encounter (HOSPITAL_COMMUNITY): Payer: Self-pay

## 2024-01-16 ENCOUNTER — Other Ambulatory Visit: Payer: Self-pay

## 2024-01-16 ENCOUNTER — Other Ambulatory Visit: Payer: Self-pay | Admitting: Physician Assistant

## 2024-01-16 DIAGNOSIS — R112 Nausea with vomiting, unspecified: Secondary | ICD-10-CM | POA: Diagnosis present

## 2024-01-16 DIAGNOSIS — R109 Unspecified abdominal pain: Secondary | ICD-10-CM | POA: Insufficient documentation

## 2024-01-16 DIAGNOSIS — Z5321 Procedure and treatment not carried out due to patient leaving prior to being seen by health care provider: Secondary | ICD-10-CM | POA: Insufficient documentation

## 2024-01-16 DIAGNOSIS — R079 Chest pain, unspecified: Secondary | ICD-10-CM | POA: Insufficient documentation

## 2024-01-16 DIAGNOSIS — D751 Secondary polycythemia: Secondary | ICD-10-CM

## 2024-01-16 DIAGNOSIS — R0602 Shortness of breath: Secondary | ICD-10-CM | POA: Diagnosis not present

## 2024-01-16 LAB — COMPREHENSIVE METABOLIC PANEL WITH GFR
ALT: 8 U/L (ref 0–44)
AST: 15 U/L (ref 15–41)
Albumin: 3.2 g/dL — ABNORMAL LOW (ref 3.5–5.0)
Alkaline Phosphatase: 89 U/L (ref 38–126)
Anion gap: 6 (ref 5–15)
BUN: 10 mg/dL (ref 8–23)
CO2: 26 mmol/L (ref 22–32)
Calcium: 7.3 mg/dL — ABNORMAL LOW (ref 8.9–10.3)
Chloride: 111 mmol/L (ref 98–111)
Creatinine, Ser: 0.79 mg/dL (ref 0.61–1.24)
GFR, Estimated: >60 mL/min
Glucose, Bld: 84 mg/dL (ref 70–99)
Potassium: 3.4 mmol/L — ABNORMAL LOW (ref 3.5–5.1)
Sodium: 142 mmol/L (ref 135–145)
Total Bilirubin: 0.7 mg/dL (ref 0.0–1.2)
Total Protein: 5.3 g/dL — ABNORMAL LOW (ref 6.5–8.1)

## 2024-01-16 LAB — CBC
HCT: 40.4 % (ref 39.0–52.0)
Hemoglobin: 13.6 g/dL (ref 13.0–17.0)
MCH: 29.2 pg (ref 26.0–34.0)
MCHC: 33.7 g/dL (ref 30.0–36.0)
MCV: 86.7 fL (ref 80.0–100.0)
Platelets: 120 K/uL — ABNORMAL LOW (ref 150–400)
RBC: 4.66 MIL/uL (ref 4.22–5.81)
RDW: 13.5 % (ref 11.5–15.5)
WBC: 7.2 K/uL (ref 4.0–10.5)
nRBC: 0 % (ref 0.0–0.2)

## 2024-01-16 LAB — LIPASE, BLOOD: Lipase: 31 U/L (ref 11–51)

## 2024-01-16 MED ORDER — ONDANSETRON HCL 4 MG/2ML IJ SOLN
4.0000 mg | Freq: Once | INTRAMUSCULAR | Status: AC
  Administered 2024-01-16: 4 mg via INTRAVENOUS
  Filled 2024-01-16: qty 2

## 2024-01-16 NOTE — ED Notes (Signed)
 Patient states his unable give urine sample at this time.

## 2024-01-16 NOTE — ED Notes (Signed)
Patient states he wants to leave.  IV removed.

## 2024-01-16 NOTE — ED Notes (Signed)
 Called patient x2 for blood work

## 2024-01-16 NOTE — ED Triage Notes (Signed)
 Pt BIBA from home, flu like symptoms. NV, abd pain. SOB, chest pressure. X 5 days.  Per EMS pt took Oxy for pain, pupils appear to be pinpoint & slurred speech.   96% 2L 102/64 BP HR A Fiv 80-140.  CBG 127
